# Patient Record
Sex: Female | Born: 1935 | Race: White | Hispanic: No | Marital: Married | Smoking: Never smoker
Health system: Southern US, Community
[De-identification: ages and names within clinical notes are randomized; demographics above are authoritative.]

## PROBLEM LIST (undated history)

## (undated) DIAGNOSIS — C4490 Unspecified malignant neoplasm of skin, unspecified: Secondary | ICD-10-CM

## (undated) DIAGNOSIS — I10 Essential (primary) hypertension: Secondary | ICD-10-CM

## (undated) DIAGNOSIS — Z8673 Personal history of transient ischemic attack (TIA), and cerebral infarction without residual deficits: Secondary | ICD-10-CM

## (undated) DIAGNOSIS — H9192 Unspecified hearing loss, left ear: Secondary | ICD-10-CM

## (undated) DIAGNOSIS — N3010 Interstitial cystitis (chronic) without hematuria: Secondary | ICD-10-CM

## (undated) DIAGNOSIS — G8929 Other chronic pain: Secondary | ICD-10-CM

## (undated) DIAGNOSIS — G629 Polyneuropathy, unspecified: Secondary | ICD-10-CM

## (undated) DIAGNOSIS — M797 Fibromyalgia: Secondary | ICD-10-CM

## (undated) DIAGNOSIS — F329 Major depressive disorder, single episode, unspecified: Secondary | ICD-10-CM

## (undated) DIAGNOSIS — R296 Repeated falls: Secondary | ICD-10-CM

## (undated) DIAGNOSIS — N390 Urinary tract infection, site not specified: Secondary | ICD-10-CM

## (undated) DIAGNOSIS — G35 Multiple sclerosis: Secondary | ICD-10-CM

## (undated) DIAGNOSIS — I471 Supraventricular tachycardia, unspecified: Secondary | ICD-10-CM

## (undated) DIAGNOSIS — M1990 Unspecified osteoarthritis, unspecified site: Secondary | ICD-10-CM

## (undated) DIAGNOSIS — R911 Solitary pulmonary nodule: Secondary | ICD-10-CM

## (undated) DIAGNOSIS — Z8781 Personal history of (healed) traumatic fracture: Secondary | ICD-10-CM

## (undated) DIAGNOSIS — C50919 Malignant neoplasm of unspecified site of unspecified female breast: Secondary | ICD-10-CM

## (undated) DIAGNOSIS — F419 Anxiety disorder, unspecified: Secondary | ICD-10-CM

## (undated) DIAGNOSIS — R42 Dizziness and giddiness: Secondary | ICD-10-CM

## (undated) DIAGNOSIS — E785 Hyperlipidemia, unspecified: Secondary | ICD-10-CM

## (undated) DIAGNOSIS — Z923 Personal history of irradiation: Secondary | ICD-10-CM

## (undated) DIAGNOSIS — I479 Paroxysmal tachycardia, unspecified: Secondary | ICD-10-CM

## (undated) DIAGNOSIS — F32A Depression, unspecified: Secondary | ICD-10-CM

## (undated) DIAGNOSIS — R079 Chest pain, unspecified: Secondary | ICD-10-CM

## (undated) DIAGNOSIS — K219 Gastro-esophageal reflux disease without esophagitis: Secondary | ICD-10-CM

## (undated) DIAGNOSIS — K228 Other specified diseases of esophagus: Secondary | ICD-10-CM

## (undated) DIAGNOSIS — E46 Unspecified protein-calorie malnutrition: Secondary | ICD-10-CM

## (undated) DIAGNOSIS — M47816 Spondylosis without myelopathy or radiculopathy, lumbar region: Secondary | ICD-10-CM

## (undated) DIAGNOSIS — K5790 Diverticulosis of intestine, part unspecified, without perforation or abscess without bleeding: Secondary | ICD-10-CM

## (undated) DIAGNOSIS — G5600 Carpal tunnel syndrome, unspecified upper limb: Secondary | ICD-10-CM

## (undated) DIAGNOSIS — E041 Nontoxic single thyroid nodule: Secondary | ICD-10-CM

## (undated) DIAGNOSIS — M25551 Pain in right hip: Secondary | ICD-10-CM

## (undated) DIAGNOSIS — K589 Irritable bowel syndrome without diarrhea: Secondary | ICD-10-CM

## (undated) DIAGNOSIS — R0602 Shortness of breath: Secondary | ICD-10-CM

## (undated) DIAGNOSIS — E7800 Pure hypercholesterolemia, unspecified: Secondary | ICD-10-CM

## (undated) DIAGNOSIS — E538 Deficiency of other specified B group vitamins: Secondary | ICD-10-CM

## (undated) HISTORY — DX: Unspecified osteoarthritis, unspecified site: M19.90

## (undated) HISTORY — DX: Other specified diseases of esophagus: K22.8

## (undated) HISTORY — PX: HEMORRHOID SURGERY: SHX153

## (undated) HISTORY — DX: Paroxysmal tachycardia, unspecified: I47.9

## (undated) HISTORY — DX: Personal history of (healed) traumatic fracture: Z87.81

## (undated) HISTORY — DX: Dizziness and giddiness: R42

## (undated) HISTORY — DX: Chest pain, unspecified: R07.9

## (undated) HISTORY — PX: CHOLECYSTECTOMY: SHX55

## (undated) HISTORY — DX: Unspecified hearing loss, left ear: H91.92

## (undated) HISTORY — DX: Deficiency of other specified B group vitamins: E53.8

## (undated) HISTORY — DX: Unspecified protein-calorie malnutrition: E46

## (undated) HISTORY — DX: Gastro-esophageal reflux disease without esophagitis: K21.9

## (undated) HISTORY — PX: TOTAL ABDOMINAL HYSTERECTOMY W/ BILATERAL SALPINGOOPHORECTOMY: SHX83

## (undated) HISTORY — DX: Hyperlipidemia, unspecified: E78.5

## (undated) HISTORY — DX: Polyneuropathy, unspecified: G62.9

## (undated) HISTORY — DX: Essential (primary) hypertension: I10

## (undated) HISTORY — DX: Nontoxic single thyroid nodule: E04.1

## (undated) HISTORY — DX: Supraventricular tachycardia: I47.1

## (undated) HISTORY — DX: Other chronic pain: G89.29

## (undated) HISTORY — DX: Spondylosis without myelopathy or radiculopathy, lumbar region: M47.816

## (undated) HISTORY — DX: Irritable bowel syndrome, unspecified: K58.9

## (undated) HISTORY — PX: OTHER SURGICAL HISTORY: SHX169

## (undated) HISTORY — DX: Urinary tract infection, site not specified: N39.0

## (undated) HISTORY — DX: Depression, unspecified: F32.A

## (undated) HISTORY — DX: Diverticulosis of intestine, part unspecified, without perforation or abscess without bleeding: K57.90

## (undated) HISTORY — DX: Pure hypercholesterolemia, unspecified: E78.00

## (undated) HISTORY — DX: Anxiety disorder, unspecified: F41.9

## (undated) HISTORY — PX: TONSILLECTOMY: SUR1361

## (undated) HISTORY — DX: Carpal tunnel syndrome, unspecified upper limb: G56.00

## (undated) HISTORY — DX: Solitary pulmonary nodule: R91.1

## (undated) HISTORY — DX: Repeated falls: R29.6

## (undated) HISTORY — PX: BREAST SURGERY: SHX581

## (undated) HISTORY — DX: Fibromyalgia: M79.7

## (undated) HISTORY — PX: TYMPANOPLASTY: SHX33

## (undated) HISTORY — DX: Supraventricular tachycardia, unspecified: I47.10

## (undated) HISTORY — DX: Shortness of breath: R06.02

## (undated) HISTORY — DX: Pain in right hip: M25.551

## (undated) HISTORY — DX: Multiple sclerosis: G35

## (undated) HISTORY — DX: Personal history of transient ischemic attack (TIA), and cerebral infarction without residual deficits: Z86.73

## (undated) HISTORY — PX: APPENDECTOMY: SHX54

## (undated) HISTORY — DX: Major depressive disorder, single episode, unspecified: F32.9

---

## 1968-08-06 HISTORY — PX: MASTECTOMY: SHX3

## 1973-08-06 HISTORY — PX: AUGMENTATION MAMMAPLASTY: SUR837

## 1998-06-07 ENCOUNTER — Other Ambulatory Visit: Admission: RE | Admit: 1998-06-07 | Discharge: 1998-06-07 | Payer: Self-pay | Admitting: Obstetrics and Gynecology

## 1998-07-22 ENCOUNTER — Ambulatory Visit (HOSPITAL_COMMUNITY): Admission: RE | Admit: 1998-07-22 | Discharge: 1998-07-22 | Payer: Self-pay | Admitting: Ophthalmology

## 1999-03-20 ENCOUNTER — Ambulatory Visit (HOSPITAL_COMMUNITY): Admission: RE | Admit: 1999-03-20 | Discharge: 1999-03-20 | Payer: Self-pay | Admitting: *Deleted

## 1999-03-20 ENCOUNTER — Encounter: Payer: Self-pay | Admitting: *Deleted

## 1999-06-26 ENCOUNTER — Other Ambulatory Visit: Admission: RE | Admit: 1999-06-26 | Discharge: 1999-06-26 | Payer: Self-pay | Admitting: Obstetrics and Gynecology

## 1999-10-02 ENCOUNTER — Encounter: Payer: Self-pay | Admitting: Family Medicine

## 1999-10-02 ENCOUNTER — Encounter: Admission: RE | Admit: 1999-10-02 | Discharge: 1999-10-02 | Payer: Self-pay | Admitting: Family Medicine

## 1999-10-17 ENCOUNTER — Ambulatory Visit (HOSPITAL_COMMUNITY): Admission: RE | Admit: 1999-10-17 | Discharge: 1999-10-17 | Payer: Self-pay | Admitting: Family Medicine

## 1999-10-17 ENCOUNTER — Encounter: Payer: Self-pay | Admitting: Family Medicine

## 1999-11-02 ENCOUNTER — Encounter: Payer: Self-pay | Admitting: General Surgery

## 1999-11-02 ENCOUNTER — Encounter: Admission: RE | Admit: 1999-11-02 | Discharge: 1999-11-02 | Payer: Self-pay | Admitting: General Surgery

## 1999-12-18 ENCOUNTER — Encounter: Payer: Self-pay | Admitting: General Surgery

## 1999-12-20 ENCOUNTER — Encounter (INDEPENDENT_AMBULATORY_CARE_PROVIDER_SITE_OTHER): Payer: Self-pay | Admitting: Specialist

## 1999-12-20 ENCOUNTER — Ambulatory Visit (HOSPITAL_COMMUNITY): Admission: RE | Admit: 1999-12-20 | Discharge: 1999-12-21 | Payer: Self-pay | Admitting: General Surgery

## 2000-02-20 ENCOUNTER — Encounter: Payer: Self-pay | Admitting: Obstetrics and Gynecology

## 2000-02-20 ENCOUNTER — Ambulatory Visit (HOSPITAL_COMMUNITY): Admission: RE | Admit: 2000-02-20 | Discharge: 2000-02-20 | Payer: Self-pay | Admitting: Rheumatology

## 2000-02-20 ENCOUNTER — Encounter: Payer: Self-pay | Admitting: Rheumatology

## 2000-02-29 ENCOUNTER — Encounter (INDEPENDENT_AMBULATORY_CARE_PROVIDER_SITE_OTHER): Payer: Self-pay | Admitting: Specialist

## 2000-02-29 ENCOUNTER — Inpatient Hospital Stay (HOSPITAL_COMMUNITY): Admission: RE | Admit: 2000-02-29 | Discharge: 2000-03-01 | Payer: Self-pay | Admitting: Obstetrics and Gynecology

## 2000-05-02 ENCOUNTER — Encounter: Admission: RE | Admit: 2000-05-02 | Discharge: 2000-05-24 | Payer: Self-pay | Admitting: Neurosurgery

## 2000-07-04 ENCOUNTER — Ambulatory Visit (HOSPITAL_COMMUNITY): Admission: RE | Admit: 2000-07-04 | Discharge: 2000-07-05 | Payer: Self-pay | Admitting: Neurosurgery

## 2000-08-19 ENCOUNTER — Ambulatory Visit (HOSPITAL_COMMUNITY): Admission: RE | Admit: 2000-08-19 | Discharge: 2000-08-19 | Payer: Self-pay | Admitting: Neurosurgery

## 2000-09-05 ENCOUNTER — Ambulatory Visit (HOSPITAL_COMMUNITY): Admission: RE | Admit: 2000-09-05 | Discharge: 2000-09-05 | Payer: Self-pay | Admitting: Neurosurgery

## 2000-09-26 ENCOUNTER — Ambulatory Visit (HOSPITAL_COMMUNITY): Admission: RE | Admit: 2000-09-26 | Discharge: 2000-09-26 | Payer: Self-pay | Admitting: Neurosurgery

## 2000-11-07 ENCOUNTER — Ambulatory Visit (HOSPITAL_COMMUNITY): Admission: RE | Admit: 2000-11-07 | Discharge: 2000-11-07 | Payer: Self-pay | Admitting: Obstetrics and Gynecology

## 2000-11-07 ENCOUNTER — Encounter: Payer: Self-pay | Admitting: Obstetrics and Gynecology

## 2001-01-16 ENCOUNTER — Inpatient Hospital Stay (HOSPITAL_COMMUNITY): Admission: RE | Admit: 2001-01-16 | Discharge: 2001-01-21 | Payer: Self-pay | Admitting: Neurosurgery

## 2001-02-18 ENCOUNTER — Encounter: Admission: RE | Admit: 2001-02-18 | Discharge: 2001-02-18 | Payer: Self-pay | Admitting: Neurosurgery

## 2001-03-20 ENCOUNTER — Ambulatory Visit (HOSPITAL_BASED_OUTPATIENT_CLINIC_OR_DEPARTMENT_OTHER): Admission: RE | Admit: 2001-03-20 | Discharge: 2001-03-20 | Payer: Self-pay | Admitting: *Deleted

## 2001-05-30 ENCOUNTER — Encounter: Admission: RE | Admit: 2001-05-30 | Discharge: 2001-05-30 | Payer: Self-pay | Admitting: Neurosurgery

## 2001-10-01 ENCOUNTER — Encounter: Payer: Self-pay | Admitting: *Deleted

## 2001-10-01 ENCOUNTER — Encounter: Admission: RE | Admit: 2001-10-01 | Discharge: 2001-10-01 | Payer: Self-pay | Admitting: *Deleted

## 2001-10-04 HISTORY — PX: CARDIAC CATHETERIZATION: SHX172

## 2001-10-23 ENCOUNTER — Ambulatory Visit (HOSPITAL_COMMUNITY): Admission: RE | Admit: 2001-10-23 | Discharge: 2001-10-23 | Payer: Self-pay | Admitting: Cardiology

## 2001-11-13 ENCOUNTER — Encounter: Admission: RE | Admit: 2001-11-13 | Discharge: 2001-11-13 | Payer: Self-pay | Admitting: *Deleted

## 2001-11-13 ENCOUNTER — Encounter: Payer: Self-pay | Admitting: *Deleted

## 2001-11-28 ENCOUNTER — Ambulatory Visit (HOSPITAL_COMMUNITY): Admission: RE | Admit: 2001-11-28 | Discharge: 2001-11-28 | Payer: Self-pay | Admitting: Neurology

## 2001-11-28 ENCOUNTER — Encounter: Payer: Self-pay | Admitting: Neurology

## 2001-12-15 ENCOUNTER — Ambulatory Visit (HOSPITAL_BASED_OUTPATIENT_CLINIC_OR_DEPARTMENT_OTHER): Admission: RE | Admit: 2001-12-15 | Discharge: 2001-12-15 | Payer: Self-pay | Admitting: Orthopedic Surgery

## 2001-12-22 ENCOUNTER — Encounter: Admission: RE | Admit: 2001-12-22 | Discharge: 2002-03-22 | Payer: Self-pay | Admitting: Neurology

## 2002-04-18 ENCOUNTER — Ambulatory Visit (HOSPITAL_COMMUNITY): Admission: RE | Admit: 2002-04-18 | Discharge: 2002-04-18 | Payer: Self-pay | Admitting: Neurosurgery

## 2002-10-29 ENCOUNTER — Ambulatory Visit (HOSPITAL_COMMUNITY): Admission: RE | Admit: 2002-10-29 | Discharge: 2002-10-29 | Payer: Self-pay | Admitting: *Deleted

## 2002-10-29 ENCOUNTER — Encounter: Payer: Self-pay | Admitting: Obstetrics and Gynecology

## 2002-10-29 ENCOUNTER — Encounter: Admission: RE | Admit: 2002-10-29 | Discharge: 2002-10-29 | Payer: Self-pay | Admitting: Obstetrics and Gynecology

## 2002-10-29 ENCOUNTER — Encounter: Payer: Self-pay | Admitting: *Deleted

## 2002-11-19 ENCOUNTER — Encounter: Admission: RE | Admit: 2002-11-19 | Discharge: 2002-11-19 | Payer: Self-pay | Admitting: *Deleted

## 2002-11-19 ENCOUNTER — Encounter: Payer: Self-pay | Admitting: *Deleted

## 2003-10-04 ENCOUNTER — Ambulatory Visit (HOSPITAL_COMMUNITY): Admission: RE | Admit: 2003-10-04 | Discharge: 2003-10-04 | Payer: Self-pay | Admitting: Family Medicine

## 2003-11-11 ENCOUNTER — Encounter: Payer: Self-pay | Admitting: Internal Medicine

## 2003-11-11 ENCOUNTER — Ambulatory Visit (HOSPITAL_COMMUNITY): Admission: RE | Admit: 2003-11-11 | Discharge: 2003-11-11 | Payer: Self-pay | Admitting: Internal Medicine

## 2003-11-25 ENCOUNTER — Encounter: Admission: RE | Admit: 2003-11-25 | Discharge: 2003-11-25 | Payer: Self-pay | Admitting: Family Medicine

## 2003-12-29 ENCOUNTER — Encounter: Admission: RE | Admit: 2003-12-29 | Discharge: 2003-12-29 | Payer: Self-pay | Admitting: Orthopedic Surgery

## 2003-12-29 ENCOUNTER — Ambulatory Visit (HOSPITAL_BASED_OUTPATIENT_CLINIC_OR_DEPARTMENT_OTHER): Admission: RE | Admit: 2003-12-29 | Discharge: 2003-12-29 | Payer: Self-pay | Admitting: Orthopedic Surgery

## 2003-12-29 ENCOUNTER — Ambulatory Visit (HOSPITAL_COMMUNITY): Admission: RE | Admit: 2003-12-29 | Discharge: 2003-12-29 | Payer: Self-pay | Admitting: Orthopedic Surgery

## 2004-01-20 ENCOUNTER — Encounter: Payer: Self-pay | Admitting: Internal Medicine

## 2004-05-04 ENCOUNTER — Encounter: Admission: RE | Admit: 2004-05-04 | Discharge: 2004-06-01 | Payer: Self-pay | Admitting: Neurology

## 2004-05-17 ENCOUNTER — Ambulatory Visit (HOSPITAL_COMMUNITY): Admission: RE | Admit: 2004-05-17 | Discharge: 2004-05-17 | Payer: Self-pay | Admitting: Family Medicine

## 2004-10-02 ENCOUNTER — Encounter: Admission: RE | Admit: 2004-10-02 | Discharge: 2004-10-02 | Payer: Self-pay | Admitting: Orthopedic Surgery

## 2004-10-02 ENCOUNTER — Ambulatory Visit (HOSPITAL_BASED_OUTPATIENT_CLINIC_OR_DEPARTMENT_OTHER): Admission: RE | Admit: 2004-10-02 | Discharge: 2004-10-02 | Payer: Self-pay | Admitting: Orthopedic Surgery

## 2004-10-02 ENCOUNTER — Ambulatory Visit (HOSPITAL_COMMUNITY): Admission: RE | Admit: 2004-10-02 | Discharge: 2004-10-02 | Payer: Self-pay | Admitting: Orthopedic Surgery

## 2004-10-26 ENCOUNTER — Ambulatory Visit (HOSPITAL_COMMUNITY): Admission: RE | Admit: 2004-10-26 | Discharge: 2004-10-26 | Payer: Self-pay | Admitting: Family Medicine

## 2004-11-16 ENCOUNTER — Encounter: Admission: RE | Admit: 2004-11-16 | Discharge: 2004-11-16 | Payer: Self-pay | Admitting: Neurology

## 2004-11-26 ENCOUNTER — Emergency Department (HOSPITAL_COMMUNITY): Admission: EM | Admit: 2004-11-26 | Discharge: 2004-11-26 | Payer: Self-pay | Admitting: Emergency Medicine

## 2004-12-07 ENCOUNTER — Encounter: Admission: RE | Admit: 2004-12-07 | Discharge: 2004-12-07 | Payer: Self-pay | Admitting: Obstetrics and Gynecology

## 2004-12-13 ENCOUNTER — Ambulatory Visit: Payer: Self-pay | Admitting: Internal Medicine

## 2005-03-15 ENCOUNTER — Ambulatory Visit: Payer: Self-pay | Admitting: Internal Medicine

## 2005-03-20 ENCOUNTER — Ambulatory Visit (HOSPITAL_COMMUNITY): Admission: RE | Admit: 2005-03-20 | Discharge: 2005-03-20 | Payer: Self-pay | Admitting: Internal Medicine

## 2005-03-29 ENCOUNTER — Ambulatory Visit: Payer: Self-pay | Admitting: Internal Medicine

## 2005-04-12 ENCOUNTER — Ambulatory Visit: Payer: Self-pay | Admitting: Internal Medicine

## 2005-04-12 ENCOUNTER — Ambulatory Visit (HOSPITAL_COMMUNITY): Admission: RE | Admit: 2005-04-12 | Discharge: 2005-04-12 | Payer: Self-pay | Admitting: Internal Medicine

## 2005-05-10 ENCOUNTER — Ambulatory Visit: Payer: Self-pay | Admitting: Internal Medicine

## 2005-06-18 ENCOUNTER — Ambulatory Visit: Payer: Self-pay | Admitting: Internal Medicine

## 2005-10-13 ENCOUNTER — Emergency Department (HOSPITAL_COMMUNITY): Admission: EM | Admit: 2005-10-13 | Discharge: 2005-10-13 | Payer: Self-pay | Admitting: Emergency Medicine

## 2005-12-19 ENCOUNTER — Encounter: Admission: RE | Admit: 2005-12-19 | Discharge: 2005-12-19 | Payer: Self-pay | Admitting: Obstetrics and Gynecology

## 2006-11-13 ENCOUNTER — Other Ambulatory Visit: Admission: RE | Admit: 2006-11-13 | Discharge: 2006-11-13 | Payer: Self-pay | Admitting: Unknown Physician Specialty

## 2006-11-29 ENCOUNTER — Emergency Department (HOSPITAL_COMMUNITY): Admission: EM | Admit: 2006-11-29 | Discharge: 2006-11-29 | Payer: Self-pay | Admitting: Emergency Medicine

## 2006-12-25 ENCOUNTER — Encounter: Admission: RE | Admit: 2006-12-25 | Discharge: 2006-12-25 | Payer: Self-pay | Admitting: Obstetrics and Gynecology

## 2007-01-01 ENCOUNTER — Encounter: Admission: RE | Admit: 2007-01-01 | Discharge: 2007-01-01 | Payer: Self-pay | Admitting: Obstetrics and Gynecology

## 2007-06-09 ENCOUNTER — Encounter: Admission: RE | Admit: 2007-06-09 | Discharge: 2007-06-09 | Payer: Self-pay | Admitting: Surgery

## 2007-11-06 ENCOUNTER — Ambulatory Visit: Payer: Self-pay | Admitting: Internal Medicine

## 2007-12-10 ENCOUNTER — Ambulatory Visit: Payer: Self-pay | Admitting: Internal Medicine

## 2007-12-31 ENCOUNTER — Encounter: Admission: RE | Admit: 2007-12-31 | Discharge: 2007-12-31 | Payer: Self-pay | Admitting: Obstetrics and Gynecology

## 2008-05-05 ENCOUNTER — Ambulatory Visit: Payer: Self-pay | Admitting: Internal Medicine

## 2008-05-21 ENCOUNTER — Encounter (INDEPENDENT_AMBULATORY_CARE_PROVIDER_SITE_OTHER): Payer: Self-pay | Admitting: *Deleted

## 2008-05-21 ENCOUNTER — Inpatient Hospital Stay (HOSPITAL_COMMUNITY): Admission: EM | Admit: 2008-05-21 | Discharge: 2008-05-25 | Payer: Self-pay | Admitting: Emergency Medicine

## 2008-05-23 ENCOUNTER — Encounter (INDEPENDENT_AMBULATORY_CARE_PROVIDER_SITE_OTHER): Payer: Self-pay | Admitting: *Deleted

## 2008-05-25 ENCOUNTER — Encounter (INDEPENDENT_AMBULATORY_CARE_PROVIDER_SITE_OTHER): Payer: Self-pay | Admitting: *Deleted

## 2008-05-25 ENCOUNTER — Encounter: Payer: Self-pay | Admitting: Internal Medicine

## 2008-06-16 ENCOUNTER — Ambulatory Visit: Payer: Self-pay | Admitting: Internal Medicine

## 2008-06-16 DIAGNOSIS — R1032 Left lower quadrant pain: Secondary | ICD-10-CM | POA: Insufficient documentation

## 2008-06-16 DIAGNOSIS — K591 Functional diarrhea: Secondary | ICD-10-CM

## 2008-06-16 DIAGNOSIS — R197 Diarrhea, unspecified: Secondary | ICD-10-CM | POA: Insufficient documentation

## 2008-06-16 DIAGNOSIS — K582 Mixed irritable bowel syndrome: Secondary | ICD-10-CM

## 2008-06-19 ENCOUNTER — Encounter: Payer: Self-pay | Admitting: Internal Medicine

## 2008-07-07 ENCOUNTER — Encounter: Admission: RE | Admit: 2008-07-07 | Discharge: 2008-07-07 | Payer: Self-pay | Admitting: Orthopedic Surgery

## 2008-07-21 ENCOUNTER — Ambulatory Visit: Payer: Self-pay | Admitting: Internal Medicine

## 2008-08-09 ENCOUNTER — Telehealth: Payer: Self-pay | Admitting: Internal Medicine

## 2008-08-22 ENCOUNTER — Inpatient Hospital Stay (HOSPITAL_COMMUNITY): Admission: EM | Admit: 2008-08-22 | Discharge: 2008-08-26 | Payer: Self-pay | Admitting: Emergency Medicine

## 2008-08-23 ENCOUNTER — Ambulatory Visit: Payer: Self-pay | Admitting: Internal Medicine

## 2008-10-06 ENCOUNTER — Ambulatory Visit: Payer: Self-pay | Admitting: Internal Medicine

## 2008-10-15 ENCOUNTER — Ambulatory Visit: Payer: Self-pay | Admitting: Internal Medicine

## 2008-11-24 ENCOUNTER — Ambulatory Visit: Payer: Self-pay | Admitting: Internal Medicine

## 2009-01-12 ENCOUNTER — Encounter: Admission: RE | Admit: 2009-01-12 | Discharge: 2009-01-12 | Payer: Self-pay | Admitting: Family Medicine

## 2009-01-14 ENCOUNTER — Encounter: Admission: RE | Admit: 2009-01-14 | Discharge: 2009-01-14 | Payer: Self-pay | Admitting: Family Medicine

## 2009-04-12 ENCOUNTER — Inpatient Hospital Stay (HOSPITAL_COMMUNITY): Admission: EM | Admit: 2009-04-12 | Discharge: 2009-04-19 | Payer: Self-pay | Admitting: Emergency Medicine

## 2009-04-13 ENCOUNTER — Telehealth (INDEPENDENT_AMBULATORY_CARE_PROVIDER_SITE_OTHER): Payer: Self-pay | Admitting: *Deleted

## 2009-04-25 ENCOUNTER — Encounter: Payer: Self-pay | Admitting: Internal Medicine

## 2009-05-25 ENCOUNTER — Encounter: Payer: Self-pay | Admitting: Internal Medicine

## 2009-06-01 ENCOUNTER — Ambulatory Visit: Payer: Self-pay | Admitting: Internal Medicine

## 2009-06-01 DIAGNOSIS — I10 Essential (primary) hypertension: Secondary | ICD-10-CM

## 2009-06-01 DIAGNOSIS — Z8679 Personal history of other diseases of the circulatory system: Secondary | ICD-10-CM

## 2009-06-09 ENCOUNTER — Ambulatory Visit: Payer: Self-pay | Admitting: Internal Medicine

## 2009-06-09 ENCOUNTER — Encounter: Payer: Self-pay | Admitting: Internal Medicine

## 2009-06-12 ENCOUNTER — Encounter: Payer: Self-pay | Admitting: Internal Medicine

## 2009-08-11 ENCOUNTER — Telehealth: Payer: Self-pay | Admitting: Internal Medicine

## 2009-08-30 ENCOUNTER — Telehealth (INDEPENDENT_AMBULATORY_CARE_PROVIDER_SITE_OTHER): Payer: Self-pay | Admitting: *Deleted

## 2009-10-10 ENCOUNTER — Telehealth: Payer: Self-pay | Admitting: Internal Medicine

## 2009-10-12 ENCOUNTER — Ambulatory Visit: Payer: Self-pay | Admitting: Internal Medicine

## 2009-10-15 ENCOUNTER — Encounter: Payer: Self-pay | Admitting: Internal Medicine

## 2009-10-18 ENCOUNTER — Telehealth: Payer: Self-pay | Admitting: Internal Medicine

## 2009-12-07 ENCOUNTER — Encounter: Admission: RE | Admit: 2009-12-07 | Discharge: 2009-12-07 | Payer: Self-pay | Admitting: Otolaryngology

## 2009-12-23 ENCOUNTER — Encounter: Admission: RE | Admit: 2009-12-23 | Discharge: 2009-12-23 | Payer: Self-pay | Admitting: Otolaryngology

## 2009-12-27 ENCOUNTER — Ambulatory Visit (HOSPITAL_BASED_OUTPATIENT_CLINIC_OR_DEPARTMENT_OTHER): Admission: RE | Admit: 2009-12-27 | Discharge: 2009-12-28 | Payer: Self-pay | Admitting: Otolaryngology

## 2010-01-18 ENCOUNTER — Encounter: Admission: RE | Admit: 2010-01-18 | Discharge: 2010-01-18 | Payer: Self-pay | Admitting: Family Medicine

## 2010-04-11 ENCOUNTER — Telehealth: Payer: Self-pay | Admitting: Internal Medicine

## 2010-05-17 ENCOUNTER — Encounter: Admission: RE | Admit: 2010-05-17 | Discharge: 2010-05-17 | Payer: Self-pay | Admitting: Family Medicine

## 2010-06-26 ENCOUNTER — Telehealth: Payer: Self-pay | Admitting: Internal Medicine

## 2010-06-26 ENCOUNTER — Ambulatory Visit: Payer: Self-pay | Admitting: Internal Medicine

## 2010-06-26 DIAGNOSIS — N3010 Interstitial cystitis (chronic) without hematuria: Secondary | ICD-10-CM

## 2010-06-26 DIAGNOSIS — E1149 Type 2 diabetes mellitus with other diabetic neurological complication: Secondary | ICD-10-CM

## 2010-06-26 DIAGNOSIS — IMO0001 Reserved for inherently not codable concepts without codable children: Secondary | ICD-10-CM

## 2010-06-26 DIAGNOSIS — E119 Type 2 diabetes mellitus without complications: Secondary | ICD-10-CM | POA: Insufficient documentation

## 2010-06-26 DIAGNOSIS — F3341 Major depressive disorder, recurrent, in partial remission: Secondary | ICD-10-CM

## 2010-06-26 DIAGNOSIS — M797 Fibromyalgia: Secondary | ICD-10-CM

## 2010-06-26 DIAGNOSIS — R296 Repeated falls: Secondary | ICD-10-CM | POA: Insufficient documentation

## 2010-06-26 DIAGNOSIS — K219 Gastro-esophageal reflux disease without esophagitis: Secondary | ICD-10-CM

## 2010-06-26 DIAGNOSIS — K222 Esophageal obstruction: Secondary | ICD-10-CM | POA: Insufficient documentation

## 2010-06-26 LAB — CONVERTED CEMR LAB
Basophils Absolute: 0 10*3/uL (ref 0.0–0.1)
Basophils Relative: 0.3 % (ref 0.0–3.0)
CRP, High Sensitivity: 1.83 (ref 0.00–5.00)
Eosinophils Absolute: 0.1 10*3/uL (ref 0.0–0.7)
Glucose, Bld: 122 mg/dL — ABNORMAL HIGH (ref 70–99)
HCT: 36.2 % (ref 36.0–46.0)
Lymphs Abs: 1.4 10*3/uL (ref 0.7–4.0)
Monocytes Relative: 6.9 % (ref 3.0–12.0)
Neutro Abs: 5.5 10*3/uL (ref 1.4–7.7)
Neutrophils Relative %: 73.1 % (ref 43.0–77.0)
Nitrite: NEGATIVE
RDW: 14.6 % (ref 11.5–14.6)
Sodium: 142 meq/L (ref 135–145)
Specific Gravity, Urine: >=1.03 (ref 1.000–1.030)
Urine Glucose: NEGATIVE mg/dL
Urobilinogen, UA: 0.2 (ref 0.0–1.0)
pH: 6.5 (ref 5.0–8.0)

## 2010-06-27 ENCOUNTER — Ambulatory Visit: Payer: Self-pay | Admitting: Cardiology

## 2010-06-28 ENCOUNTER — Telehealth: Payer: Self-pay | Admitting: Physician Assistant

## 2010-07-18 ENCOUNTER — Ambulatory Visit: Payer: Self-pay | Admitting: Internal Medicine

## 2010-07-18 ENCOUNTER — Telehealth: Payer: Self-pay | Admitting: Internal Medicine

## 2010-07-20 ENCOUNTER — Telehealth: Payer: Self-pay | Admitting: Internal Medicine

## 2010-08-02 ENCOUNTER — Telehealth: Payer: Self-pay | Admitting: Internal Medicine

## 2010-08-09 ENCOUNTER — Ambulatory Visit: Payer: Self-pay | Admitting: Cardiology

## 2010-08-24 ENCOUNTER — Ambulatory Visit
Admission: RE | Admit: 2010-08-24 | Discharge: 2010-08-24 | Payer: Self-pay | Source: Home / Self Care | Attending: Internal Medicine | Admitting: Internal Medicine

## 2010-08-24 ENCOUNTER — Encounter: Payer: Self-pay | Admitting: Internal Medicine

## 2010-08-26 ENCOUNTER — Encounter: Payer: Self-pay | Admitting: Family Medicine

## 2010-09-05 NOTE — Progress Notes (Signed)
Summary: stool study results and follow-up   Phone Note Outgoing Call   Summary of Call: let her know no infection how is she? I think its IBS Iva Boop MD, [facility]  October 18, 2009 9:07 PM   Follow-up for Phone Call        Patient aware, she hasn't had diarhea in the last 3 days.  No pain at this time.  "I'm doing pretty good".  I have asked her to follow up with Dr Leone Payor as needed.  She is aware we will call back if Dr Leone Payor has any other recommendations. Follow-up by: Darcey Nora RN, CGRN,  October 19, 2009 2:27 PM

## 2010-09-05 NOTE — Progress Notes (Signed)
Summary: triage   Phone Note Call from Patient Call back at [phone]   Caller: Patient Call For: Dr. Leone Payor Reason for Call: Talk to Nurse Details for Reason: triage Summary of Call: Pt. calls complaining of diarrhea for the last two weeks but today especially experiencing tremendous pain on her left side under her ribs associated with it.  Requests to be seen today.  Please call and advise. Initial call taken by: Schuyler Amor,  June 26, 2010 9:49 AM  Follow-up for Phone Call        Several day hx of diarrhea and LLQ pain.  Patient  states severe LLQ tenderness and difficulty walking due to the pain.  Denies nausea or vomiting, fever.  She does c/o waking up in a cold sweat.  Patient  is advised to stay on a clear liquid diet and come in and see Amy Esterwood PA toay at 2:00 Follow-up by: Darcey Nora RN, CGRN,  June 26, 2010 11:06 AM

## 2010-09-05 NOTE — Assessment & Plan Note (Signed)
Summary: LLQ abdominal pain, diarrhea/sheri    History of Present Illness Primary GI MD: Stan Head MD [facility] Primary Provider: Sonny Masters Smith,MD Requesting Provider: n/a Chief Complaint: LLQ abd tenderness and intermittant sharp pains that are worse after meals. Also intermittant watery loose diarrhea x 2 weeks. Pain and diarrhea has gotten worse in the last week.  History of Present Illness:   PLEASANT 77 YO FEMALE KNOWN TO DR. Leone Payor. SHE HAS HX OF DIVERTICULITIS AND IBS. SHE COMES IN TODAY WITH C/O ONE WEEK OF LOWER ABDOMINAL PAIN WHICH HAS BEEN CONSTANT .PAIN IS LLQ AND RADIATES ACROSS HER LOWER ABDOMEN. SHE HAS HAD NAUSEA, NO VOMITING, DECREASED APPETITE. SHE DENIES FEVRE BUT HAS HAD SWEATS THE LAST FEW NIGHTS. STOOLS ARE LOOSE, USUALLY WITH DIARRHEA AT NIGHT, NO BLEEDING. . SHE HAS NOT FELT WELL FOR 2 WEEKS WITH GAS, INCREASED STOOLING, THEN PAIN STARTED.  SHE SAYS SHE HURTS WITH WALKING, HAS BEEN TAKING HYDROCODONE  WHICH HELPS SOME. LAST COLONOSCOPY3/10 WITH SEVERE DIVERTICULOSIS ASCENDING TO SIGMOID COLON. SHE WAS HOSPITALIZED WITH DIVERTICULITIS ONE YEAR AGO.   GI Review of Systems    Reports abdominal pain, loss of appetite, and  nausea.     Location of  Abdominal pain: LLQ.    Denies acid reflux, belching, bloating, chest pain, dysphagia with liquids, dysphagia with solids, heartburn, vomiting, vomiting blood, weight loss, and  weight gain.      Reports diarrhea, hemorrhoids, and  rectal pain.     Denies anal fissure, black tarry stools, change in bowel habit, constipation, diverticulosis, fecal incontinence, heme positive stool, irritable bowel syndrome, jaundice, light color stool, liver problems, and  rectal bleeding.    Current Medications (verified): 1)  Metformin Hcl 500 Mg Tabs (Metformin Hcl) .Marland Kitchen.. 1 By Mouth Two Times A Day 2)  Simvastatin 40 Mg Tabs (Simvastatin) .Marland Kitchen.. 1 By Mouth Once Daily 3)  Sertraline Hcl 50 Mg Tabs (Sertraline Hcl) .Marland Kitchen.. 1 By Mouth Once Daily 4)   Lisinopril 20 Mg Tabs (Lisinopril) .Marland Kitchen.. 1 By Mouth Once Daily 5)  Sular 34 Mg Xr24h-Tab (Nisoldipine) .... Take 1 Tablet By Mouth Once A Day 6)  Zetia 10 Mg Tabs (Ezetimibe) .Marland Kitchen.. 1 By Mouth Once Daily 7)  Tambocor 50 Mg Tabs (Flecainide Acetate) .Marland Kitchen.. 1 By Mouth Two Times A Day/ Pt Due For Appt. 8)  Diazepam 2 Mg Tabs (Diazepam) .Marland Kitchen.. 1 By Mouth As Needed 9)  Meclizine Hcl 25 Mg Tabs (Meclizine Hcl) .Marland Kitchen.. 1 Tablet By Mouth As Needed For Dizziness 10)  Prevacid 15 Mg Cpdr (Lansoprazole) .... One Tablet By Mouth Once Daily 11)  Elmiron 100 Mg Caps (Pentosan Polysulfate Sodium) .... Take 1 Tablet By Mouth Once A Day 12)  Calcium .... Take 1 Tablet By Mouth As Needed 13)  Glycopyrrolate 2 Mg  Tabs (Glycopyrrolate) .... Take 1 Tablet By Mouth Two Times A Day 14)  Metamucil 48.57 % Powd (Psyllium) .... Titrate Up To 1 Tablespoon Qd 15)  Lyrica 75 Mg Caps (Pregabalin) .... One Tablet By Mouth Once Daily 16)  Hydrocodone-Acetaminophen 5-500 Mg Tabs (Hydrocodone-Acetaminophen) .... As Needed 17)  Flexeril 10 Mg Tabs (Cyclobenzaprine Hcl) .... Take One By Mouth Two Times A Day 18)  Pepto-Bismol 524 Mg/64ml Susp (Bismuth Subsalicylate) .... Take As Needed 19)  Promethazine Hcl 25 Mg Tabs (Promethazine Hcl) .... Take 1/2-1 Tablet Every 6-8 Hours As Needed For Nausea 20)  Imodium A-D 2 Mg Tabs (Loperamide Hcl) .... Take 1 Tablet As Needed Up To 4 Tablets Per Day 21)  Flecainide Acetate  50 Mg Tabs (Flecainide Acetate) .... Take One Tablet By Mouth Every 12 Hours  Allergies (verified): 1)  ! Sulfa 2)  ! Biaxin 3)  ! Neurontin 4)  ! * Avonex 5)  ! * Decadron 6)  ! * Ivp Dye 7)  ! * Synthroid 8)  ! * Methylpred 9)  ! * Meto 10)  ! Cardizem  Past History:  Past Medical History: SEVERE DIVERTICULOSIS HX BREAST CANCER Hemorrhoids Esophageal Stricture/S/P DILATION AODM Hypertension Irritable Bowel Syndrome Arthritis Chronic Headaches Depression Fibromyalgia Multiple Sclerosis Spinal  Stenosis Interstitial Cystitis  TIA right eye  Past Surgical History: Breast-Mastectomy-right Tubal Ligation Appendectomy Cholecystectomy Hemorrhoidectomy Hysterectomy Deviated Septum Surgery CTS surgery Cataracts both eye 3 retina surgeries back surgery breast implant Rectocele Cystocele exploratory surgery x 3 surgery on 5th digit left hand x 2 left shoulder surgery Feb 2011 [facility]  Family History: Reviewed history from 10/06/2008 and no changes required. No FH of Colon Cancer: father died from lung cancer Family History of Clotting disorder: Brother  Social History: Reviewed history from 10/06/2008 and no changes required. Patient has never smoked.  Alcohol Use - no Daily Caffeine Use-1 cup daily Illicit Drug Use - no Patient does not get regular exercise.   Review of Systems  The patient denies allergy/sinus, anemia, anxiety-new, arthritis/joint pain, back pain, blood in urine, breast changes/lumps, change in vision, confusion, cough, coughing up blood, depression-new, fainting, fatigue, fever, headaches-new, hearing problems, heart murmur, heart rhythm changes, itching, menstrual pain, muscle pains/cramps, night sweats, nosebleeds, pregnancy symptoms, shortness of breath, skin rash, sleeping problems, sore throat, swelling of feet/legs, swollen lymph glands, thirst - excessive , urination - excessive , urination changes/pain, urine leakage, vision changes, and voice change.         SEE HPI  Vital Signs:  Patient profile:   77 year old female Height:      62 inches Weight:      154.25 pounds BMI:     28.31 Pulse rate:   70 / minute Pulse rhythm:   regular BP sitting:   122 / 62  (right arm) Cuff size:   regular  Vitals Entered By: Christie Nottingham CMA Duncan Dull) (June 26, 2010 2:00 PM)  Physical Exam  General:  Well developed, well nourished, no acute distress.,PALE Head:  Normocephalic and atraumatic. Eyes:  PERRLA, no icterus. Neck:   Supple; no masses or thyromegaly. Lungs:  Clear throughout to auscultation. Heart:  Regular rate and rhythm; no murmurs, rubs,  or bruits. Abdomen:  SOFT, QUITE TENDER LLQ,EARLY REBOUND, BS+, MILD DIFFUSE TENDERNESS, NO MASS OR HSM Rectal:  NOT DONE Extremities:  No clubbing, cyanosis, edema or deformities noted. Neurologic:  Alert and  oriented x4;  grossly normal neurologically. Psych:  Alert and cooperative. Normal mood and affect.   Impression & Recommendations:  Problem # 1:  RECURRENT DIVERTICULITIS-COLON (ICD-562.11) Assessment Deteriorated 77 YO FEMALE WITH RECURRENT DIVERTICULITIS, WITH LLQ PAIN X ONE WEEK.  LABS AS BELOW CT ABDOMEN PELVIS-NO IV CONTRAST, R/O COMPLICATED DIVERTICULITIS START CIPRO 500 MG TWICE DAILY X 2 WEEKS START FLAGYL 500 MG TWICE DAILY X 2 WEEKS CONTINUE HYDROCODONE AS NEEDED FOR PAIN FULL LIQUID DIET X 24-48 HOURS, THEN GRADUALLY ADVANCE. WILL ARRANGE FOLLOW UP BASED ON CT . Orders: TLB-BMP (Basic Metabolic Panel-BMET) ([id number]) TLB-CRP-High Sensitivity (C-Reactive Protein) (86140-FCRP) TLB-CBC Platelet - w/Differential ([id number]) TLB-Udip w/ Micro (81001-URINE) CT Abdomen/Pelvis w/o Contrast (CT ABD/Pel w/o con)  Problem # 2:  IRRITABLE BOWEL SYNDROME (ICD-564.1) Assessment: Comment Only  Problem # 3:  HYPERTENSION (ICD-401.9) Assessment: Comment Only  Problem # 4:  GERD (ICD-530.81) Assessment: Comment Only STABLE  Problem # 5:  INTERSTITIAL CYSTITIS (ICD-595.1) Assessment: Comment Only  Patient Instructions: 1)  Please go to lab, basement level. 2)  We scheduled the CT Scan at [facility], [address] [facility] for tomorrow 06-27-10. 3)  Directions and contrast  provided. 4)  We will call you with the lab and CT Scan results. 5)  Stay on a full liquid diet for 24 to 28 hours.  Brochure provided. 6)  Copy sent to : Merri Brunette, MD 7)  The medication list was reviewed and reconciled.  All changed / newly prescribed medications  were explained.  A complete medication list was provided to the patient / caregiver. Prescriptions: FLAGYL 500 MG TABS (METRONIDAZOLE) Take 1 tab twice daily x 14 days  #28 x 0   Entered by:   Lowry Ram NCMA   Authorized by:   Sammuel Cooper PA-c   Signed by:   Lowry Ram NCMA on 06/26/2010   Method used:   Electronically to        [facility] [phone]* (retail)       [address] [place], [place]  [address]       Ph: [phone] or [phone]       Fax: [phone]   RxID:   [id number] CIPRO 500 MG TABS (CIPROFLOXACIN HCL) Take 1 tab twice daily x 14 days  #28 x 0   Entered by:   Lowry Ram NCMA   Authorized by:   Sammuel Cooper PA-c   Signed by:   Lowry Ram NCMA on 06/26/2010   Method used:   Electronically to        [facility] [phone]* (retail)       [address] [place], [place]  [address]       Ph: [phone] or [phone]       Fax: [phone]   RxID:   [id number]

## 2010-09-05 NOTE — Progress Notes (Signed)
Summary: surgical clearance   Phone Note Call from Patient Call back at Home Phone [phone]   Caller: Patient Reason for Call: Talk to Nurse Summary of Call: needs surgical clearance for left shoulder surgery, please fax ok to Dr Turner Blakely [phone] Initial call taken by: Migdalia Dk,  August 11, 2009 9:33 AM  Follow-up for Phone Call        ok for surgery per Dr Ladona Ridgel. Low risk for cardiovascular complications Dennis Bast, RN, BSN  August 11, 2009 6:30 PM

## 2010-09-05 NOTE — Progress Notes (Signed)
   Phone Note From Other Clinic   Caller: Huntley Dec Details for Reason: Pt.Information Initial call taken by: Denny Peon    Faxed all Cardiac over to [facility] to fax [phone] [facility]  August 30, 2009 10:44 AM

## 2010-09-05 NOTE — Assessment & Plan Note (Signed)
Summary: right lower quad pain/follow up from triage/sheri    History of Present Illness Visit Type: Follow-up Visit Primary GI MD: Stan Head MD [facility] Primary Provider: Sonny Masters Smith,MD Requesting Provider: n/a Chief Complaint: RLQ pain and diarrhea.  History of Present Illness:   Patient complains of diarrhea that started last week, which is now more constipation. She states that she is ahving constat nasuea and belching. The RLQ abdominal pain is now radiating to her lower back. She states that when she had the diarrhea last week she seen some mucous in her stool.  No urinary sxs +globus just had left shoulder surgery explosive stool today Pepto bismol no help pills have "hurt my stomach" - hydrocodone nauseated all the time she is holding metamucil has not tried Imodium + nocturnal diarrhea last week  a bit better   GI Review of Systems    Reports abdominal pain, belching, nausea, and  weight loss.     Location of  Abdominal pain: RLQ.    Denies acid reflux, bloating, chest pain, dysphagia with liquids, dysphagia with solids, heartburn, loss of appetite, vomiting, vomiting blood, and  weight gain.      Reports constipation and  diarrhea.     Denies anal fissure, black tarry stools, change in bowel habit, diverticulosis, fecal incontinence, heme positive stool, hemorrhoids, irritable bowel syndrome, jaundice, light color stool, liver problems, rectal bleeding, and  rectal pain.    Current Medications (verified): 1)  Metformin Hcl 500 Mg Tabs (Metformin Hcl) .Marland Kitchen.. 1 By Mouth Two Times A Day 2)  Simvastatin 40 Mg Tabs (Simvastatin) .Marland Kitchen.. 1 By Mouth Once Daily 3)  Sertraline Hcl 50 Mg Tabs (Sertraline Hcl) .Marland Kitchen.. 1 By Mouth Once Daily 4)  Lisinopril 20 Mg Tabs (Lisinopril) .Marland Kitchen.. 1 By Mouth Once Daily 5)  Sular 34 Mg Xr24h-Tab (Nisoldipine) .... Take 1 Tablet By Mouth Once A Day 6)  Zetia 10 Mg Tabs (Ezetimibe) .Marland Kitchen.. 1 By Mouth Once Daily 7)  Tambocor 50 Mg Tabs (Flecainide Acetate)  .Marland Kitchen.. 1 By Mouth Two Times A Day/ Pt Due For Appt. 8)  Diazepam 2 Mg Tabs (Diazepam) .Marland Kitchen.. 1 By Mouth As Needed 9)  Meclizine Hcl 25 Mg Tabs (Meclizine Hcl) .Marland Kitchen.. 1 Tablet By Mouth As Needed For Dizziness 10)  Prevacid 15 Mg Cpdr (Lansoprazole) .... One Tablet By Mouth Once Daily 11)  Elmiron 100 Mg Caps (Pentosan Polysulfate Sodium) .... Take 1 Tablet By Mouth Once A Day 12)  Calcium .... Take 1 Tablet By Mouth As Needed 13)  Glycopyrrolate 2 Mg  Tabs (Glycopyrrolate) .... Take 1 Tablet By Mouth Two Times A Day 14)  Metamucil 48.57 % Powd (Psyllium) .... Titrate Up To 1 Tablespoon Qd 15)  Align  Caps (Misc Intestinal Flora Regulat) .... Take Daily, 1 Month Pulse Vs. Continuous 16)  Lyrica 75 Mg Caps (Pregabalin) .... One Tablet By Mouth Once Daily 17)  Hydrocodone-Acetaminophen 5-500 Mg Tabs (Hydrocodone-Acetaminophen) .... As Needed 18)  Flexeril 10 Mg Tabs (Cyclobenzaprine Hcl) .... Take One By Mouth Two Times A Day 19)  Pepto-Bismol 524 Mg/75ml Susp (Bismuth Subsalicylate) .... Take As Needed  Allergies (verified): 1)  ! Sulfa 2)  ! Biaxin 3)  ! Neurontin 4)  ! * Avonex 5)  ! * Decadron 6)  ! * Ivp Dye 7)  ! * Synthroid 8)  ! * Methylpred 9)  ! * Meto 10)  ! Cardizem  Past History:  Past Medical History: Last updated: 06/01/2009 Diverticulosis Hemorrhoids Esophageal Stricture Diabetes Hypertension Irritable Bowel  Syndrome Arthritis Chronic Headaches Depression Fibromyalgia Multiple Sclerosis Spinal Stenosis Interstitial Cystitis Recurrent diverticulitis TIA right eye  Past Surgical History: Breast-Mastectomy-right Tubal Ligation Appendectomy Cholecystectomy Hemorrhoidectomy Hysterectomy Deviated Septum Surgery CTS surgery Cataracts both eye 3 retina surgeries back surgery breast implant Rectocele Cystocele exploratory surgery x 3 surgery on 5th digit left hand x 2 left shoulder surgery Feb 2011 [facility]  Family History: Reviewed history from 10/06/2008  and no changes required. No FH of Colon Cancer: father died from lung cancer Family History of Clotting disorder: Brother  Social History: Reviewed history from 10/06/2008 and no changes required. Patient has never smoked.  Alcohol Use - no Daily Caffeine Use-1 cup daily Illicit Drug Use - no Patient does not get regular exercise.   Review of Systems       ? fever "skin burns and I am hot" left shoulder pain post-op  Vital Signs:  Patient profile:   77 year old female Height:      62 inches Weight:      152.4 pounds BMI:     27.98 Pulse rate:   72 / minute Pulse rhythm:   regular BP sitting:   128 / 62  (left arm) Cuff size:   regular  Vitals Entered By: Harlow Mares CMA Duncan Dull) (October 12, 2009 4:13 PM)  Physical Exam  General:  Well developed, well nourished, no acute distress. Eyes:  anicteric Lungs:  Clear throughout to auscultation. Heart:  Regular rate and rhythm; no murmurs, rubs,  or bruits. Abdomen:  soft and  mildly tender diffuselywithout masses no HSM surgical scars BS+ Neurologic:  Alert and oriented x 3.   Impression & Recommendations:  Problem # 1:  DIARRHEA (ICD-787.91) Assessment Deteriorated She has chronic recurrent problems with this. She thought this was her diverticulitis. I think is probably a flare of her irritable bowel syndrome but it's quite likely she had antibiotics for her shoulder surgery and she could have C. difficile colitis. Will order stool studies to include C. difficile toxin culture and fecal leukocytes. Given that she so nauseated with iron metronidazole empirically at this time and wait for stool studies and she can use of Imodium. Overall she is better than she was last week I think she's sore in her right lower quadrant from the diarrhea and IBS. Orders: T-Culture, C-Diff Toxin A/B [id number]) T-Culture, Stool ([id number]) T-Fecal WBC ([id number])  Problem # 2:  IRRITABLE BOWEL SYNDROME (ICD-564.1) Assessment:  Deteriorated I think her problems are a flare of IBS but we will look for possible infection. She was reassured and will await the stool studies. She knows to call back if things deteriorate further. It sounds like she is anxious about shoulder surgery and PT and I think that is part of this.  Problem # 3:  NAUSEA (ICD-787.02) Assessment: New Promethazine prn  Patient Instructions: 1)  Please go to the basement to have your lab tests drawn today.  STOOL STUDIES  2)  Please pick up your medications at your pharmacy.  3)  We will call you with follow up after we have your lab results. 4)  The medication list was reviewed and reconciled.  All changed / newly prescribed medications were explained.  A complete medication list was provided to the patient / caregiver. Prescriptions: PROMETHAZINE HCL 25 MG TABS (PROMETHAZINE HCL) take 1/2-1 tablet every 6-8 hours as needed for nausea  #30 x 0   Entered by:   Francee Piccolo CMA (AAMA)   Authorized by:   Maryjean Morn  Leone Payor MD, Clementeen Graham   Signed by:   Francee Piccolo CMA (AAMA) on 10/12/2009   Method used:   Electronically to        [facility] [phone]* (retail)       [address]       [place], [place]  [address]       Ph: [phone] or [phone]       Fax: [phone]   RxID:   [id number]

## 2010-09-05 NOTE — Progress Notes (Signed)
Summary: refill request   Phone Note Refill Request Message from:  Patient on April 11, 2010 8:44 AM  Refills Requested: Medication #1:  FLECAINIDE ACETATE 50 MG TABS Take one tablet by mouth every 12 hours. [facility]   Method Requested: Telephone to Pharmacy Initial call taken by: Glynda Jaeger,  April 11, 2010 8:44 AM    Prescriptions: FLECAINIDE ACETATE 50 MG TABS (FLECAINIDE ACETATE) Take one tablet by mouth every 12 hours  #60 x 2   Entered by:   Laurance Flatten CMA   Authorized by:   Laren Boom, MD, [facility]   Signed by:   Laurance Flatten CMA on 04/11/2010   Method used:   Electronically to        [facility] [phone]* (retail)       [address]       [place], [place]  [address]       Ph: [phone] or [phone]       Fax: [phone]   RxID:   [id number]

## 2010-09-05 NOTE — Progress Notes (Signed)
Summary: CT results   Phone Note Call from Patient Call back at Home Phone [phone]   Caller: Patient Call For: Mike Gip Reason for Call: Lab or Test Results Summary of Call: Calling about her CT Scan results Initial call taken by: Karna Christmas,  June 28, 2010 9:56 AM  Follow-up for Phone Call        Called pt twice in a few min time, she answers the phone but isn't saying anything.  I will try later.  Additional Follow-up for Phone Call Additional follow up Details #1::        Notated on the append of the CT scan. Additional Follow-up by: Joselyn Glassman,  June 28, 2010 12:44 PM

## 2010-09-05 NOTE — Progress Notes (Signed)
Summary: triage   Phone Note Call from Patient Call back at Home Phone [phone]   Caller: Patient Call For: Dr. Leone Payor Reason for Call: Talk to Nurse Summary of Call: pt reporting diarrhea, vomiting, and pain in her right side x 1 week Initial call taken by: Vallarie Mare,  October 10, 2009 10:37 AM  Follow-up for Phone Call        Pt c/o RLQ pain x one week.  Dairrhea.  Hx of diverticulitis.  Pt states pain is the same as diverticulitis flare up.  No fever.  No bleeding but states there is alot of mucus in stools.  Pt had shoulder surgery a couple of weeks ago.  She can not drive.  Will have to arrange transportation if needs to come in. Follow-up by: Ashok Cordia RN,  October 10, 2009 11:01 AM  Additional Follow-up for Phone Call Additional follow up Details #1::        I think this sounds more like colitis (?  C. diff) or IBS She would be best served by an evaluation from me or an extender in the office. Additional Follow-up by: Iva Boop MD, Clementeen Graham,  October 10, 2009 12:32 PM    Additional Follow-up for Phone Call Additional follow up Details #2::    LM for pt to call.  Ashok Cordia RN  October 10, 2009 12:48 PM   Patient  scheduled to see Dr Leone Payor 10-12-09 2:45. Patient aware of appointment date and time Follow-up by: Darcey Nora RN, CGRN,  October 10, 2009 1:44 PM

## 2010-09-07 NOTE — Assessment & Plan Note (Signed)
Summary: F/U Diverticulitis, saw PA    History of Present Illness Visit Type: Follow-up Visit Primary GI MD: Stan Head MD [facility] Primary Provider: Merri Brunette, MD Requesting Provider: n/a Chief Complaint: Patient here for f/u diverticulitis. She complains of continue LLQ abdominal pain and diarrhea. History of Present Illness:   77 yo ww here to follow-up diverticulitis. Recurrent problem and diagnosed (descending colon) by abd/pelvic CT 06/27/10. Better but still sore in LLQ and RLQ. Significant gascramps sill. Relieved with flatus passage. constipated for 3 days then develeoped diarrea and treated with Imodium several days ago. That has resolved. Felt hot when she had the diarrhea. No tempreature taken. Back to baseline (mostly) now.    GI Review of Systems    Reports abdominal pain, acid reflux, belching, bloating, and  nausea.     Location of  Abdominal pain: epigastric/LLQ.    Denies chest pain, dysphagia with liquids, dysphagia with solids, heartburn, loss of appetite, vomiting, vomiting blood, weight loss, and  weight gain.      Reports change in bowel habits, diarrhea, and  diverticulosis.     Denies anal fissure, black tarry stools, constipation, fecal incontinence, heme positive stool, hemorrhoids, irritable bowel syndrome, jaundice, light color stool, liver problems, rectal bleeding, and  rectal pain.    CT Abdomen/Pelvis  Procedure date:  06/27/2010  Findings:       1.  Descending colon diverticulitis without abscess.  Follow up recommended to confirm appropriate resolution and   exclude underlying mucosal lesion. 2.  Stable benign splenic and hepatic cystic lesions. 3.  Stable hyperdense renal lesions. 4.  Stable peri urethral calcification, possible calculus.  Colonoscopy  Procedure date:  10/15/2008  Findings:      1) Severe diverticulosis. Mild in right colon, severe in sigmoid colon. 2) Internal hemorrhoids 3) Otherwise normal  examination  EGD  Procedure date:  06/09/2009  Findings:          1) Stenosis at the gastroesophageal junction, dilated to 54     French     2) Tortuous esophagus in the total esophagus     3) Polyps in the fundus, look like benign fundic gland polyps     (biospied)     4) Otherwise normal examination.   Current Medications (verified): 1)  Metformin Hcl 500 Mg Tabs (Metformin Hcl) .Marland Kitchen.. 1 By Mouth Two Times A Day 2)  Simvastatin 40 Mg Tabs (Simvastatin) .Marland Kitchen.. 1 By Mouth Once Daily 3)  Sertraline Hcl 50 Mg Tabs (Sertraline Hcl) .Marland Kitchen.. 1 By Mouth Once Daily 4)  Lisinopril 20 Mg Tabs (Lisinopril) .Marland Kitchen.. 1 By Mouth Once Daily 5)  Sular 34 Mg Xr24h-Tab (Nisoldipine) .... Take 1 Tablet By Mouth Once A Day 6)  Zetia 10 Mg Tabs (Ezetimibe) .Marland Kitchen.. 1 By Mouth Once Daily 7)  Tambocor 50 Mg Tabs (Flecainide Acetate) .Marland Kitchen.. 1 By Mouth Two Times A Day/ Pt Due For Appt. 8)  Diazepam 2 Mg Tabs (Diazepam) .Marland Kitchen.. 1 By Mouth As Needed 9)  Meclizine Hcl 25 Mg Tabs (Meclizine Hcl) .Marland Kitchen.. 1 Tablet By Mouth As Needed For Dizziness 10)  Prevacid 15 Mg Cpdr (Lansoprazole) .... One Tablet By Mouth Once Daily 11)  Elmiron 100 Mg Caps (Pentosan Polysulfate Sodium) .... Take 1 Tablet By Mouth Once A Day 12)  Calcium .... Take 1 Tablet By Mouth As Needed 13)  Glycopyrrolate 2 Mg  Tabs (Glycopyrrolate) .... Take 1 Tablet By Mouth Two Times A Day 14)  Metamucil 48.57 % Powd (Psyllium) .... Titrate Up To  1 Tablespoon Qd 15)  Lyrica 75 Mg Caps (Pregabalin) .... One Tablet By Mouth Once Daily 16)  Hydrocodone-Acetaminophen 5-500 Mg Tabs (Hydrocodone-Acetaminophen) .... As Needed 17)  Flexeril 10 Mg Tabs (Cyclobenzaprine Hcl) .... Take One By Mouth Two Times A Day 18)  Pepto-Bismol 524 Mg/55ml Susp (Bismuth Subsalicylate) .... Take As Needed 19)  Promethazine Hcl 25 Mg Tabs (Promethazine Hcl) .... Take 1/2-1 Tablet Every 6-8 Hours As Needed For Nausea 20)  Imodium A-D 2 Mg Tabs (Loperamide Hcl) .... Take 1 Tablet As Needed Up To 4  Tablets Per Day 21)  Flecainide Acetate 50 Mg Tabs (Flecainide Acetate) .... Take One Tablet By Mouth Every 12 Hours 22)  Cipro 500 Mg Tabs (Ciprofloxacin Hcl) .... Take 1 Tab Twice Daily X 14 Days  Allergies (verified): 1)  ! Sulfa 2)  ! Biaxin 3)  ! Neurontin 4)  ! * Avonex 5)  ! * Decadron 6)  ! * Ivp Dye 7)  ! * Synthroid 8)  ! * Methylpred 9)  ! * Meto 10)  ! Cardizem  Past History:  Past Medical History: Reviewed history from 06/26/2010 and no changes required. SEVERE DIVERTICULOSIS HX BREAST CANCER Hemorrhoids Esophageal Stricture/S/P DILATION AODM Hypertension Irritable Bowel Syndrome Arthritis Chronic Headaches Depression Fibromyalgia Multiple Sclerosis Spinal Stenosis Interstitial Cystitis  TIA right eye  Past Surgical History: Breast-Mastectomy-right Tubal Ligation Appendectomy Cholecystectomy Hemorrhoidectomy Hysterectomy Deviated Septum Surgery CTS surgery Cataracts both eye 3 retina surgeries back surgery breast implant-right Rectocele Repair Cystocele Repair exploratory surgery x 3 surgery on 5th digit left hand x 2 left shoulder surgery Feb 2011 [facility] surgery-left  Family History: Reviewed history from 10/06/2008 and no changes required. No FH of Colon Cancer: father died from lung cancer Family History of Clotting disorder: Brother  Social History: Reviewed history from 10/06/2008 and no changes required. Patient has never smoked.  Alcohol Use - no Daily Caffeine Use-1 cup daily Illicit Drug Use - no Patient does not get regular exercise.   Vital Signs:  Patient profile:   77 year old female Height:      62 inches Weight:      155.25 pounds BMI:     28.50 BSA:     1.72 Pulse rate:   84 / minute Pulse rhythm:   regular BP sitting:   124 / 66  (left arm)  Vitals Entered By: Lamona Curl CMA Duncan Dull) (July 18, 2010 2:25 PM)  Physical Exam  General:  elderly NAD Abdomen:  soft, mildly protuberant,  BS+ tender LLQ and sore all over no rebound or guarding not dissimilar to other exams   Impression & Recommendations:  Problem # 1:  RECURRENT DIVERTICULITIS-COLON (ICD-562.11) Assessment Improved Multiple episodes over the past 1--2 years though at times difficult to sort IBs vs. diverticulitis (though CT's have proven). She has seen Wenda Low and not thought to be a surgical candidate (would require subtotal colectomyif so) I will review those records she is not a great operative candidate  I think she is clinically resolved and back to baseline IBS ? if any sense to try ASA therapy as aminosalicylates have been used to reduce recurrent diverticulitis though not clearly established in that role. Other option would be intermittent rifamaxin antibiotic therapy. After reviewing theis I plan to offer her 5-ASA therapy.  recheck CT 6 weeks after last  Orders: CT Abdomen/Pelvis w/o Contrast (CT ABD/Pel w/o con)  Problem # 2:  IRRITABLE BOWEL SYNDROME (ICD-564.1) Assessment: Unchanged no chnage in Tx  Patient Instructions:  1)  Your CT is scheduled at [facility] CT on 08/09/10 @ 9:30am.  See seperate instructions. 2)  We will call you with further follow up after reviewing these results.  3)  Copy sent to : Merri Brunette, MD, Wenda Low, MD 4)  The medication list was reviewed and reconciled.  All changed / newly prescribed medications were explained.  A complete medication list was provided to the patient / caregiver.

## 2010-09-07 NOTE — Progress Notes (Signed)
Summary: Questions   Phone Note Call from Patient Call back at Home Phone [phone]   Caller: Patient Call For: Dr. Leone Payor Reason for Call: Talk to Nurse Summary of Call: Pt says she had missed call from [place] with no message, wants to know why we were calling says it may have been a reminder for her CT on 08/09/10 Initial call taken by: [place] Johnson,  August 02, 2010 10:27 AM  Follow-up for Phone Call        I spoke with the patient and told her that as far as I can tell from the chart that we were not trying to reach her.  She  will call back if she has any other questions. Follow-up by: Darcey Nora RN, CGRN,  August 02, 2010 11:13 AM

## 2010-09-07 NOTE — Assessment & Plan Note (Signed)
Summary: rov. gd  Medications Added NISOLDIPINE 30 MG XR24H-TAB (NISOLDIPINE) once daily OMEPRAZOLE 20 MG CPDR (OMEPRAZOLE) once daily FUROSEMIDE 20 MG TABS (FUROSEMIDE) Take one tablet by mouth daily. as needed DICYCLOMINE HCL 20 MG TABS (DICYCLOMINE HCL) once daily NASACORT AQ 55 MCG/ACT AERS (TRIAMCINOLONE ACETONIDE) UAD ZYRTEC HIVES RELIEF 10 MG TABS (CETIRIZINE HCL) UAD URELLE 81 MG TABS (METH-HYO-M BL-NA PHOS-PH SAL) 4 times a day        Visit Type:  Follow-up Referring Provider:  n/a Primary Provider:  Merri Brunette, MD   History of Present Illness: [name] returns today for followup.  She is a pleasant elderly woman with a h/o palpitations and documented atrial tachycardia.  She has been treated with flecainide therapy at low dose for over a year.  She denies c/p or sob.  Her main problem has been related to diarrhea and she has widespread diverticulosis and has been considered for total colectomy.  She was recently hospitalized but has refused colectomy. Currently her symptoms are not too bad and she is being observed.  She has almost no palpitations on the flecainide.  Current Medications (verified): 1)  Metformin Hcl 500 Mg Tabs (Metformin Hcl) .Marland Kitchen.. 1 By Mouth Two Times A Day 2)  Simvastatin 40 Mg Tabs (Simvastatin) .Marland Kitchen.. 1 By Mouth Once Daily 3)  Sertraline Hcl 50 Mg Tabs (Sertraline Hcl) .Marland Kitchen.. 1 By Mouth Once Daily 4)  Lisinopril 20 Mg Tabs (Lisinopril) .Marland Kitchen.. 1 By Mouth Once Daily 5)  Nisoldipine 30 Mg Xr24h-Tab (Nisoldipine) .... Once Daily 6)  Zetia 10 Mg Tabs (Ezetimibe) .Marland Kitchen.. 1 By Mouth Once Daily 7)  Tambocor 50 Mg Tabs (Flecainide Acetate) .Marland Kitchen.. 1 By Mouth Two Times A Day/ Pt Due For Appt. 8)  Diazepam 2 Mg Tabs (Diazepam) .Marland Kitchen.. 1 By Mouth As Needed 9)  Meclizine Hcl 25 Mg Tabs (Meclizine Hcl) .Marland Kitchen.. 1 Tablet By Mouth As Needed For Dizziness 10)  Omeprazole 20 Mg Cpdr (Omeprazole) .... Once Daily 11)  Elmiron 100 Mg Caps (Pentosan Polysulfate Sodium) .... Take 1 Tablet  By Mouth Once A Day 12)  Lyrica 75 Mg Caps (Pregabalin) .... One Tablet By Mouth Once Daily 13)  Hydrocodone-Acetaminophen 5-500 Mg Tabs (Hydrocodone-Acetaminophen) .... As Needed 14)  Pepto-Bismol 524 Mg/104ml Susp (Bismuth Subsalicylate) .... Take As Needed 15)  Promethazine Hcl 25 Mg Tabs (Promethazine Hcl) .... Take 1/2-1 Tablet Every 6-8 Hours As Needed For Nausea 16)  Imodium A-D 2 Mg Tabs (Loperamide Hcl) .... Take 1 Tablet As Needed Up To 4 Tablets Per Day 17)  Flecainide Acetate 50 Mg Tabs (Flecainide Acetate) .... Take One Tablet By Mouth Every 12 Hours 18)  Furosemide 20 Mg Tabs (Furosemide) .... Take One Tablet By Mouth Daily. As Needed 19)  Dicyclomine Hcl 20 Mg Tabs (Dicyclomine Hcl) .... Once Daily 20)  Nasacort Aq 55 Mcg/act Aers (Triamcinolone Acetonide) .... Uad 21)  Zyrtec Hives Relief 10 Mg Tabs (Cetirizine Hcl) .... Uad 22)  Urelle 81 Mg Tabs (Meth-Hyo-M Bl-Na Phos-Ph Sal) .... 4 Times A Day  Allergies: 1)  ! Sulfa 2)  ! Biaxin 3)  ! Neurontin 4)  ! * Avonex 5)  ! * Decadron 6)  ! * Ivp Dye 7)  ! * Synthroid 8)  ! * Methylpred 9)  ! * Meto 10)  ! Cardizem  Past History:  Past Medical History: Last updated: 06/26/2010 SEVERE DIVERTICULOSIS HX BREAST CANCER Hemorrhoids Esophageal Stricture/S/P DILATION AODM Hypertension Irritable Bowel Syndrome Arthritis Chronic Headaches Depression Fibromyalgia Multiple Sclerosis Spinal Stenosis Interstitial  Cystitis  TIA right eye  Past Surgical History: Last updated: 07/18/2010 Breast-Mastectomy-right Tubal Ligation Appendectomy Cholecystectomy Hemorrhoidectomy Hysterectomy Deviated Septum Surgery CTS surgery Cataracts both eye 3 retina surgeries back surgery breast implant-right Rectocele Repair Cystocele Repair exploratory surgery x 3 surgery on 5th digit left hand x 2 left shoulder surgery Feb 2011 [facility] surgery-left  Review of Systems  The patient denies chest pain, syncope, dyspnea on  exertion, and peripheral edema.    Vital Signs:  Patient profile:   77 year old female Height:      62 inches Weight:      158 pounds BMI:     29.00 Pulse rate:   73 / minute BP sitting:   110 / 62  (left arm)  Vitals Entered By: Laurance Flatten CMA (August 24, 2010 3:13 PM)  Physical Exam  General:  elderly NAD Head:  Normocephalic and atraumatic. Eyes:  PERRLA, no icterus. Mouth:  No deformity or lesions, dentition normal. Neck:  Supple; no masses or thyromegaly. Lungs:  Clear throughout to auscultation. No wheezes, rales, or rhonchi. Heart:  Regular rate and rhythm; no murmurs, rubs,  or bruits. Abdomen:  soft, mildly protuberant, BS+ tender LLQ and sore all over no rebound or guarding not dissimilar to other exams Pulses:  pulses normal in all 4 extremities Extremities:  No clubbing, cyanosis, edema or deformities noted. Neurologic:  Alert and  oriented x4;  grossly normal neurologically.   Impression & Recommendations:  Problem # 1:  HYPERTENSION (ICD-401.9) Her pressure is well controlled. Will continue meds as below. Her updated medication list for this problem includes:    Lisinopril 20 Mg Tabs (Lisinopril) .Marland Kitchen... 1 by mouth once daily    Nisoldipine 30 Mg Xr24h-tab (Nisoldipine) ..... Once daily    Furosemide 20 Mg Tabs (Furosemide) .Marland Kitchen... Take one tablet by mouth daily. as needed  Problem # 2:  PSVT (ICD-427.0) Her symptoms are well controlled on low dose flecainide. Will continue meds as below. Her updated medication list for this problem includes:    Lisinopril 20 Mg Tabs (Lisinopril) .Marland Kitchen... 1 by mouth once daily    Nisoldipine 30 Mg Xr24h-tab (Nisoldipine) ..... Once daily    Tambocor 50 Mg Tabs (Flecainide acetate) .Marland Kitchen... 1 by mouth two times a day/ pt due for appt.    Flecainide Acetate 50 Mg Tabs (Flecainide acetate) .Marland Kitchen... Take one tablet by mouth every 12 hours  Patient Instructions: 1)  Your physician recommends that you continue on your current medications  as directed. Please refer to the Current Medication list given to you today. 2)  Your physician wants you to follow-up in: 1 year.  You will receive a reminder letter in the mail two months in advance. If you don't receive a letter, please call our office to schedule the follow-up appointment.

## 2010-09-07 NOTE — Progress Notes (Signed)
Summary: add asacol   Phone Note Outgoing Call   Summary of Call: Please call her an tell her i would like her to try asacol 400 mg tabs 2 by mouth two times a day to try to reduce recurrent diverticulitis. Not a proven therapy for sure but studies have suggested it can help and since she is not a good operative candidate I think she should try it. If agreeable then Rx it 2 by mouth two times a day # 120 with 5 refills. we will talk to her more after she has her CT Iva Boop MD, [facility]  July 18, 2010 9:27 PM   Follow-up for Phone Call        I spoke to pt's husband who had only phone with him at a doctor's appt.  He will be home in about an hour and we can call pt back at that time. Francee Piccolo CMA Duncan Dull)  July 19, 2010 10:36 AM   Notified pt of above recommendations.  Pt is agreeable to trying Asacol. This is called to pharmacy.  Pt asks if she may refrigerate her contrast and I advised this is OK.  Advised pt we would follow up with her after her CT, but if she has any problems before then to let us know. Follow-up by: Francee Piccolo CMA Duncan Dull),  July 19, 2010 1:56 PM    New/Updated Medications: ASACOL 400 MG TBEC (MESALAMINE) Take 2 tablets by mouth two times a day Prescriptions: ASACOL 400 MG TBEC (MESALAMINE) Take 2 tablets by mouth two times a day  #120 x 5   Entered by:   Francee Piccolo CMA (AAMA)   Authorized by:   Iva Boop MD, [facility]   Signed by:   Francee Piccolo CMA (AAMA) on 07/19/2010   Method used:   Electronically to        [facility] [phone]* (retail)       [address] [place], [place]  [address]       Ph: [phone] or [phone]       Fax: [phone]   RxID:   [id number]

## 2010-09-07 NOTE — Progress Notes (Signed)
Summary: med concern   Phone Note Call from Patient Call back at Home Phone [phone]   Caller: Patient Call For: Dr. Leone Payor Reason for Call: Talk to Nurse Summary of Call: cannot afford new medication... doesnt recall med name... [facility] on [address]. Initial call taken by: Vallarie Mare,  July 20, 2010 1:35 PM  Follow-up for Phone Call        LM to [facility] at home number Francee Piccolo CMA Duncan Dull)  July 20, 2010 2:49 PM   RC from pt.  Asacol is >$200.  We have samples of Asacol HD.  Would pt be able to take samples until her CT on 08/09/10? Dr. Leone Payor, please advise. thanks Follow-up by: Francee Piccolo CMA Duncan Dull),  July 20, 2010 2:58 PM  Additional Follow-up for Phone Call Additional follow up Details #1::        no  this won't be a good long-term solution so will not start at this time. It is a medication that might prevent recurrences of the diverticulitis but not proven. Iva Boop MD, [facility]  July 20, 2010 3:02 PM     Additional Follow-up for Phone Call Additional follow up Details #2::    notified pt of above.  Asacol rx discontinued with [facility]. Follow-up by: Francee Piccolo CMA Duncan Dull),  July 20, 2010 3:13 PM

## 2010-10-23 LAB — BASIC METABOLIC PANEL
BUN: 17 mg/dL (ref 6–23)
Chloride: 107 mEq/L (ref 96–112)
Glucose, Bld: 100 mg/dL — ABNORMAL HIGH (ref 70–99)
Potassium: 5 mEq/L (ref 3.5–5.1)

## 2010-10-30 ENCOUNTER — Other Ambulatory Visit: Payer: Self-pay | Admitting: Family Medicine

## 2010-10-30 ENCOUNTER — Ambulatory Visit
Admission: RE | Admit: 2010-10-30 | Discharge: 2010-10-30 | Disposition: A | Discharge status: Home / Self Care | Payer: Medicare Other | Source: Ambulatory Visit | Attending: Family Medicine | Admitting: Family Medicine

## 2010-10-30 DIAGNOSIS — R29898 Other symptoms and signs involving the musculoskeletal system: Secondary | ICD-10-CM

## 2010-11-08 LAB — GLUCOSE, CAPILLARY: Glucose-Capillary: 88 mg/dL (ref 70–99)

## 2010-11-10 LAB — DIFFERENTIAL
Basophils Absolute: 0 10*3/uL (ref 0.0–0.1)
Basophils Relative: 0 % (ref 0–1)
Eosinophils Absolute: 0 10*3/uL (ref 0.0–0.7)
Lymphs Abs: 1.5 10*3/uL (ref 0.7–4.0)
Monocytes Absolute: 0.6 10*3/uL (ref 0.1–1.0)
Monocytes Relative: 8 % (ref 3–12)
Neutro Abs: 5.3 10*3/uL (ref 1.7–7.7)
Neutro Abs: 7.3 10*3/uL (ref 1.7–7.7)
Neutrophils Relative %: 71 % (ref 43–77)
Neutrophils Relative %: 76 % (ref 43–77)

## 2010-11-10 LAB — CLOSTRIDIUM DIFFICILE EIA: C difficile Toxins A+B, EIA: NEGATIVE

## 2010-11-10 LAB — COMPREHENSIVE METABOLIC PANEL
ALT: 14 U/L (ref 0–35)
AST: 14 U/L (ref 0–37)
Alkaline Phosphatase: 42 U/L (ref 39–117)
Alkaline Phosphatase: 43 U/L (ref 39–117)
BUN: 8 mg/dL (ref 6–23)
CO2: 24 mEq/L (ref 19–32)
CO2: 24 mEq/L (ref 19–32)
Calcium: 9.4 mg/dL (ref 8.4–10.5)
Chloride: 110 mEq/L (ref 96–112)
GFR calc Af Amer: >60 mL/min (ref >60)
GFR calc non Af Amer: >60 mL/min (ref >60)
GFR calc non Af Amer: >60 mL/min (ref >60)
Glucose, Bld: 101 mg/dL — ABNORMAL HIGH (ref 70–99)
Potassium: 4 mEq/L (ref 3.5–5.1)
Potassium: 4.1 mEq/L (ref 3.5–5.1)
Sodium: 139 mEq/L (ref 135–145)
Total Bilirubin: 0.4 mg/dL (ref 0.3–1.2)
Total Protein: 6.5 g/dL (ref 6.0–8.3)

## 2010-11-10 LAB — URINALYSIS, ROUTINE W REFLEX MICROSCOPIC
Glucose, UA: NEGATIVE mg/dL
Hgb urine dipstick: NEGATIVE
Nitrite: NEGATIVE
Protein, ur: NEGATIVE mg/dL
Protein, ur: NEGATIVE mg/dL
Specific Gravity, Urine: 1.01 (ref 1.005–1.030)
Specific Gravity, Urine: 1.026 (ref 1.005–1.030)
Urobilinogen, UA: 0.2 mg/dL (ref 0.0–1.0)

## 2010-11-10 LAB — HEMOGLOBIN A1C
Hgb A1c MFr Bld: 7 % — ABNORMAL HIGH (ref 4.6–6.1)
Mean Plasma Glucose: 154 mg/dL

## 2010-11-10 LAB — BASIC METABOLIC PANEL
BUN: 6 mg/dL (ref 6–23)
BUN: 7 mg/dL (ref 6–23)
BUN: <1 mg/dL — ABNORMAL LOW (ref 6–23)
CO2: 24 mEq/L (ref 19–32)
CO2: 25 mEq/L (ref 19–32)
Calcium: 8.4 mg/dL (ref 8.4–10.5)
Calcium: 8.6 mg/dL (ref 8.4–10.5)
Calcium: 8.8 mg/dL (ref 8.4–10.5)
GFR calc Af Amer: >60 mL/min (ref >60)
GFR calc Af Amer: >60 mL/min (ref >60)
GFR calc non Af Amer: 58 mL/min — ABNORMAL LOW (ref >60)
GFR calc non Af Amer: >60 mL/min (ref >60)
GFR calc non Af Amer: >60 mL/min (ref >60)
GFR calc non Af Amer: >60 mL/min (ref >60)
GFR calc non Af Amer: >60 mL/min (ref >60)
GFR calc non Af Amer: >60 mL/min (ref >60)
Glucose, Bld: 122 mg/dL — ABNORMAL HIGH (ref 70–99)
Glucose, Bld: 130 mg/dL — ABNORMAL HIGH (ref 70–99)
Glucose, Bld: 82 mg/dL (ref 70–99)
Glucose, Bld: 91 mg/dL (ref 70–99)
Potassium: 3.2 mEq/L — ABNORMAL LOW (ref 3.5–5.1)
Potassium: 3.2 mEq/L — ABNORMAL LOW (ref 3.5–5.1)
Potassium: 3.2 mEq/L — ABNORMAL LOW (ref 3.5–5.1)
Potassium: 4 mEq/L (ref 3.5–5.1)
Sodium: 137 mEq/L (ref 135–145)
Sodium: 139 mEq/L (ref 135–145)
Sodium: 143 mEq/L (ref 135–145)
Sodium: 144 mEq/L (ref 135–145)

## 2010-11-10 LAB — GLUCOSE, CAPILLARY
Glucose-Capillary: 102 mg/dL — ABNORMAL HIGH (ref 70–99)
Glucose-Capillary: 102 mg/dL — ABNORMAL HIGH (ref 70–99)
Glucose-Capillary: 105 mg/dL — ABNORMAL HIGH (ref 70–99)
Glucose-Capillary: 110 mg/dL — ABNORMAL HIGH (ref 70–99)
Glucose-Capillary: 110 mg/dL — ABNORMAL HIGH (ref 70–99)
Glucose-Capillary: 110 mg/dL — ABNORMAL HIGH (ref 70–99)
Glucose-Capillary: 111 mg/dL — ABNORMAL HIGH (ref 70–99)
Glucose-Capillary: 114 mg/dL — ABNORMAL HIGH (ref 70–99)
Glucose-Capillary: 134 mg/dL — ABNORMAL HIGH (ref 70–99)
Glucose-Capillary: 143 mg/dL — ABNORMAL HIGH (ref 70–99)
Glucose-Capillary: 81 mg/dL (ref 70–99)
Glucose-Capillary: 82 mg/dL (ref 70–99)
Glucose-Capillary: 85 mg/dL (ref 70–99)
Glucose-Capillary: 85 mg/dL (ref 70–99)
Glucose-Capillary: 85 mg/dL (ref 70–99)
Glucose-Capillary: 87 mg/dL (ref 70–99)
Glucose-Capillary: 87 mg/dL (ref 70–99)
Glucose-Capillary: 92 mg/dL (ref 70–99)
Glucose-Capillary: 95 mg/dL (ref 70–99)
Glucose-Capillary: 98 mg/dL (ref 70–99)

## 2010-11-10 LAB — CBC
HCT: 36.4 % (ref 36.0–46.0)
HCT: 37.8 % (ref 36.0–46.0)
Hemoglobin: 12.1 g/dL (ref 12.0–15.0)
Hemoglobin: 12.7 g/dL (ref 12.0–15.0)
MCV: 90 fL (ref 78.0–100.0)
MCV: 90.3 fL (ref 78.0–100.0)
Platelets: 204 10*3/uL (ref 150–400)
RBC: 4.08 MIL/uL (ref 3.87–5.11)
RBC: 4.18 MIL/uL (ref 3.87–5.11)
RBC: 4.18 MIL/uL (ref 3.87–5.11)
WBC: 3.9 10*3/uL — ABNORMAL LOW (ref 4.0–10.5)
WBC: 4.7 10*3/uL (ref 4.0–10.5)
WBC: 7.5 10*3/uL (ref 4.0–10.5)
WBC: 9.6 10*3/uL (ref 4.0–10.5)

## 2010-11-10 LAB — URINE CULTURE

## 2010-11-10 LAB — MAGNESIUM: Magnesium: 1.6 mg/dL (ref 1.5–2.5)

## 2010-11-16 LAB — GLUCOSE, CAPILLARY: Glucose-Capillary: 119 mg/dL — ABNORMAL HIGH (ref 70–99)

## 2010-11-20 LAB — URINALYSIS, ROUTINE W REFLEX MICROSCOPIC
Glucose, UA: NEGATIVE mg/dL
Hgb urine dipstick: NEGATIVE
Ketones, ur: NEGATIVE mg/dL
Protein, ur: NEGATIVE mg/dL
Urobilinogen, UA: 0.2 mg/dL (ref 0.0–1.0)

## 2010-11-20 LAB — GLUCOSE, CAPILLARY
Glucose-Capillary: 103 mg/dL — ABNORMAL HIGH (ref 70–99)
Glucose-Capillary: 104 mg/dL — ABNORMAL HIGH (ref 70–99)
Glucose-Capillary: 108 mg/dL — ABNORMAL HIGH (ref 70–99)
Glucose-Capillary: 108 mg/dL — ABNORMAL HIGH (ref 70–99)
Glucose-Capillary: 109 mg/dL — ABNORMAL HIGH (ref 70–99)
Glucose-Capillary: 121 mg/dL — ABNORMAL HIGH (ref 70–99)
Glucose-Capillary: 129 mg/dL — ABNORMAL HIGH (ref 70–99)
Glucose-Capillary: 133 mg/dL — ABNORMAL HIGH (ref 70–99)
Glucose-Capillary: 134 mg/dL — ABNORMAL HIGH (ref 70–99)
Glucose-Capillary: 175 mg/dL — ABNORMAL HIGH (ref 70–99)
Glucose-Capillary: 83 mg/dL (ref 70–99)
Glucose-Capillary: 86 mg/dL (ref 70–99)
Glucose-Capillary: 95 mg/dL (ref 70–99)

## 2010-11-20 LAB — CBC
HCT: 35.4 % — ABNORMAL LOW (ref 36.0–46.0)
HCT: 37 % (ref 36.0–46.0)
HCT: 37.7 % (ref 36.0–46.0)
HCT: 40.1 % (ref 36.0–46.0)
Hemoglobin: 11.6 g/dL — ABNORMAL LOW (ref 12.0–15.0)
Hemoglobin: 12.7 g/dL (ref 12.0–15.0)
Hemoglobin: 13 g/dL (ref 12.0–15.0)
MCHC: 32.5 g/dL (ref 30.0–36.0)
MCHC: 32.8 g/dL (ref 30.0–36.0)
MCHC: 33.8 g/dL (ref 30.0–36.0)
MCV: 90.6 fL (ref 78.0–100.0)
MCV: 91 fL (ref 78.0–100.0)
Platelets: 175 10*3/uL (ref 150–400)
RBC: 3.91 MIL/uL (ref 3.87–5.11)
RBC: 4.4 MIL/uL (ref 3.87–5.11)
RDW: 14.6 % (ref 11.5–15.5)
RDW: 15.3 % (ref 11.5–15.5)
WBC: 12.1 10*3/uL — ABNORMAL HIGH (ref 4.0–10.5)
WBC: 6 10*3/uL (ref 4.0–10.5)

## 2010-11-20 LAB — COMPREHENSIVE METABOLIC PANEL
AST: 16 U/L (ref 0–37)
BUN: 12 mg/dL (ref 6–23)
CO2: 25 mEq/L (ref 19–32)
Calcium: 9 mg/dL (ref 8.4–10.5)
Chloride: 107 mEq/L (ref 96–112)
Creatinine, Ser: 0.81 mg/dL (ref 0.4–1.2)
GFR calc non Af Amer: >60 mL/min (ref >60)
Glucose, Bld: 133 mg/dL — ABNORMAL HIGH (ref 70–99)
Total Bilirubin: 0.7 mg/dL (ref 0.3–1.2)

## 2010-11-20 LAB — DIFFERENTIAL
Basophils Absolute: 0 10*3/uL (ref 0.0–0.1)
Basophils Absolute: 0 10*3/uL (ref 0.0–0.1)
Basophils Absolute: 0 10*3/uL (ref 0.0–0.1)
Basophils Relative: 0 % (ref 0–1)
Eosinophils Relative: 0 % (ref 0–5)
Eosinophils Relative: 0 % (ref 0–5)
Lymphocytes Relative: 11 % — ABNORMAL LOW (ref 12–46)
Lymphocytes Relative: 25 % (ref 12–46)
Lymphs Abs: 1.4 10*3/uL (ref 0.7–4.0)
Lymphs Abs: 1.5 10*3/uL (ref 0.7–4.0)
Monocytes Absolute: 0.7 10*3/uL (ref 0.1–1.0)
Neutro Abs: 10 10*3/uL — ABNORMAL HIGH (ref 1.7–7.7)
Neutro Abs: 4 10*3/uL (ref 1.7–7.7)
Neutrophils Relative %: 82 % — ABNORMAL HIGH (ref 43–77)

## 2010-11-20 LAB — BASIC METABOLIC PANEL
BUN: 8 mg/dL (ref 6–23)
CO2: 23 mEq/L (ref 19–32)
CO2: 25 mEq/L (ref 19–32)
CO2: 28 mEq/L (ref 19–32)
Calcium: 8.3 mg/dL — ABNORMAL LOW (ref 8.4–10.5)
Calcium: 8.5 mg/dL (ref 8.4–10.5)
Chloride: 109 mEq/L (ref 96–112)
Chloride: 111 mEq/L (ref 96–112)
GFR calc Af Amer: >60 mL/min (ref >60)
GFR calc non Af Amer: >60 mL/min (ref >60)
GFR calc non Af Amer: >60 mL/min (ref >60)
Glucose, Bld: 107 mg/dL — ABNORMAL HIGH (ref 70–99)
Glucose, Bld: 118 mg/dL — ABNORMAL HIGH (ref 70–99)
Glucose, Bld: 124 mg/dL — ABNORMAL HIGH (ref 70–99)
Glucose, Bld: 73 mg/dL (ref 70–99)
Potassium: 4 mEq/L (ref 3.5–5.1)
Potassium: 4 mEq/L (ref 3.5–5.1)
Potassium: 4 mEq/L (ref 3.5–5.1)
Potassium: 4.2 mEq/L (ref 3.5–5.1)
Sodium: 138 mEq/L (ref 135–145)
Sodium: 141 mEq/L (ref 135–145)
Sodium: 143 mEq/L (ref 135–145)
Sodium: 144 mEq/L (ref 135–145)

## 2010-11-20 LAB — LIPASE, BLOOD: Lipase: 68 U/L — ABNORMAL HIGH (ref 11–59)

## 2010-11-20 LAB — URINE MICROSCOPIC-ADD ON

## 2010-11-20 LAB — PROTIME-INR: INR: 1.1 (ref 0.00–1.49)

## 2010-11-20 LAB — CULTURE, BLOOD (ROUTINE X 2)
Culture: NO GROWTH
Culture: NO GROWTH

## 2010-11-20 LAB — AMYLASE: Amylase: 127 U/L (ref 27–131)

## 2010-12-19 NOTE — Assessment & Plan Note (Signed)
[facility]                         ELECTROPHYSIOLOGY OFFICE NOTE   NAME:Baldini, CARLE DARGAN                      MRN:          [id number]  DATE:12/10/2007                            DOB:          1929-08-17    Ms. Heslin returns today for follow-up.  She is a very pleasant elderly  woman with a history of SVT and palpitations and atrial tachycardia.  When I saw her back in early April we recommended that we try her on  some flecainide.  She returns today for follow-up.  She thinks that she  is having more diarrhea than she had previously had.  She denies any  significant palpitations since start of the flecainide.  She denies  chest pain or shortness of breath.   MEDICATIONS:  1. Metformin 500 mg twice daily.  2. Simvastatin 40 mg a day.  3. Detrol LA 4 mg daily.  4. Lisinopril 20 mg a day.  5. Requip 1 mg nightly.  6. Zetia 10 mg daily.  7. Sular 34 mg daily.  8. Actonel 35 mg weekly.  9. Flecainide mg 50 twice a day.   PHYSICAL EXAM:  She is a pleasant, elderly-appearing woman in no acute  distress.  Blood pressure was 116/60, the pulse was 80 and regular, respirations  were 18.  The weight was 159 pounds.  NECK:  No jugular venous distention.  LUNGS:  Clear bilaterally to auscultation.  No wheezes, rales or rhonchi  are present.  CARDIOVASCULAR:  A regular rate and rhythm with a normal S1 and S2.  EXTREMITIES:  No edema.   IMPRESSION:  1. Palpitations and symptomatic supraventricular tachycardia/atrial      tachycardia.  2. Flecainide therapy secondary to #1.  3. Hypertension.  4. Diarrhea, questionably related to flecainide.   DISCUSSION:  I have asked that Ms. Marcel stop her flecainide for 2  weeks to see if her diarrhea improves.  As she had had diarrhea before  starting flecainide, I am not sure that the flecainide has anything to  do with her diarrhea but I think it is worth stopping flecainide  initially to  see whether or  not she has difficulty with it.  I have asked that if her  diarrhea is unchanged that she go back to flecainide 50 mg twice a day.  I will see her back in several months.     Doylene Canning. Ladona Ridgel, MD  Electronically Signed    GWT/MedQ  DD: 12/10/2007  DT: 12/10/2007  Job #: [id number]   cc:   Armanda Magic, M.D.

## 2010-12-19 NOTE — H&P (Signed)
NAMEKRISTIEN, [name]               ACCOUNT NO.:  [id number]   MEDICAL RECORD NO.:  [id number]          PATIENT TYPE:  INP   LOCATION:  1432                         FACILITY:  [facility]   PHYSICIAN:  Kela Millin, M.D.DATE OF BIRTH:  June 25, 1930   DATE OF ADMISSION:  05/21/2008  DATE OF DISCHARGE:                              HISTORY & PHYSICAL   PRIMARY CARE PHYSICIAN:  Dr. Merri Brunette.   CHIEF COMPLAINT:  Lower abdominal pain.   HISTORY OF PRESENT ILLNESS:  The patient is a 77 year old white female  with multiple medical problems, including multiple sclerosis, diabetes  mellitus, PSVT (atrial tachycardia with secondary palpitations), bladder  cystocele, optic neuritis, history of back pain and status post surgery  in 2002, and she presents with the above complaints.  She states that  about a week ago she began experiencing some lower abdominal pain, but  it has worsened in the past 2 days.  She describes the pain as dull and  constant for the most part, but sometimes she has a burning type pain in  her bladder area.  The pain she states was 8/10 in intensity at its  worst and she got some relief with the medications in the ER.  She also  reports that she has pain along both sides of her abdomen.  She has had  nausea with intermittent vomiting in the past 2 days.  She had diarrhea  for 2 days - mostly watery, nonbloody and 4-5 times a day.  She states  she has not had any more diarrhea in the past 24 hours.  She admits to  subjective fevers and dysuria.  She denies cough, chest pain, shortness  of breath, melena and no hematochezia.   She was seen in the emergency room and a CT scan of her abdomen and  pelvis done revealed chronic diverticulosis with a small area of  inflammatory change adjacent to the junction of the descending colon and  the sigmoid, consistent with uncomplicated acute diverticulitis,  bilateral high-density renal lesions, likely representing hemorrhagic  cyst  or milk of calcium cyst.  These lesions are stable in size compared  to 2006, suggesting benign etiology.  Also stable splenic and hepatic  cysts are noted.  A urinalysis was done which was positive for the  nitrite, trace leukocyte esterase, 0-2 WBCs and few bacteria noted.  Her  white cell count 5.9 and lactic acid level 1.9.  She is admitted for  further evaluation and management.   PAST MEDICAL HISTORY:  1. As above.  2. History of TIA.  3. Glaucoma.  4. History of chronic sinus problems.  5. Status post cholecystectomy in 1997.  6. Status post cataract surgery in 1983.  7. History of vertigo.  8. History of breast reconstruction in 1980, with rupture of implant.  9. History of intestinal polyp removal.  10.History of chronic headaches.  11.Dyslipidemia.  12.History of chronic diarrhea.   MEDICATIONS:  1. Lisinopril 20 mg daily.  2. Meclizine 25 mg p.r.n.  3. Optive.  4. Zetia 10 mg daily.  5. Calcium.  6. Valium 2 mg  as needed.  7. Flecainide 50 mg b.i.d.  8. Metformin 500 mg b.i.d.  9. Omeprazole 20 mg daily.  10.Nisoldipine 30 mg daily.  11.Zoloft 50 mg daily.  12.Zocor 40 mg nightly.  13.Os-Cal 1 tablet daily.   ALLERGIES:  1. AVONEX.  2. BIAXIN.  3. DECADRON.  4. IVP DYE/IODINE CONTRAST.  5. NEURONTIN.  6. PAMELOR.  7. SULFA.  8. SYNTHROID.   SOCIAL HISTORY:  She is married with four children.  She denies tobacco,  also denies alcohol.   FAMILY HISTORY:  Her mother died with ALS and her father died in his 85s  secondary to metastatic lung cancer.   REVIEW OF SYSTEMS:  As per HPI, other review of systems negative.   PHYSICAL EXAMINATION:  GENERAL:  The patient is a pleasant elderly white  female.  She is alert and appropriate in no respiratory distress.  VITAL SIGNS:  Temperature is 97.7, blood pressure 102/61 with a pulse of  64, respiratory rate of 18.  O2 sat of 97%.  HEENT:  Atraumatic, normocephalic.  Sclerae anicteric.  Slightly dry  mucous  membranes and no oral exudates.  NECK:  Supple, no adenopathy and no thyromegaly.  LUNGS:  Decreased breath sounds at bases, otherwise clear to  auscultation.  CARDIOVASCULAR:  Regular rate and rhythm.  Normal S1-S2.  ABDOMEN:  Soft, bowel sounds present.  Mild lower abdominal tenderness,  no rebound tenderness, nontender and no masses palpable.  EXTREMITIES:  No cyanosis and no edema.  NEURO:  Alert and oriented x3.  Cranial Nerves II-XII grossly intact.  Her strength is 4+/5 in her upper extremities and 4-/5 in her lower  extremities.  Decreased sensation in her lower extremities.   LABORATORY DATA:  CT scan of abdomen and pelvis - as per HPI.  Hemoccult  is negative.  Urinalysis as per HPI.  White cell count is 5.9,  hemoglobin is 14.5, hematocrit of 43.4, platelet count of 220.  Her  sodium is 140 with a potassium of 4.1, chloride 108, BUN is 11,  creatinine is 0.7, glucose is 114, ionized calcium is 1.2, lactic acid  is 1.9.   ASSESSMENT AND PLAN:  1. Acute diverticulitis - descending/sigmoid colon, uncomplicated.      Will place on empiric antibiotics, keep n.p.o. with pain      management.  Follow and obtain stool cultures if any further      diarrhea.  Also follow and consider surgery consult pending      clinical course.  2. Dysuria - urinalysis not very remarkable, obtain urine cultures on      antibiotics for #1, follow and treat accordingly, pending cultures.  3. History of paroxysmal supraventricular tachycardia - EKG with      normal sinus rhythm, continue flecainide.  4. Diabetes mellitus - hold off metformin at this time, monitor Accu-      Cheks and cover with sliding scale insulin.  5. Hypertension - follow and continue outpatient medications.  6. History of multiple sclerosis - continue outpatient medications.      Kela Millin, M.D.  Electronically Signed     ACV/MEDQ  D:  05/22/2008  T:  05/22/2008  Job:  [id number]   cc:   Dario Guardian, M.D.   Fax: [phone]

## 2010-12-19 NOTE — Discharge Summary (Signed)
[name], [name]               ACCOUNT NO.:  [id number]   MEDICAL RECORD NO.:  [id number]          PATIENT TYPE:  INP   LOCATION:  1432                         FACILITY:  [facility]   PHYSICIAN:  Kela Millin, M.D.DATE OF BIRTH:  [birth year]   DATE OF ADMISSION:  05/21/2008  DATE OF DISCHARGE:                               DISCHARGE SUMMARY   DISCHARGE DIAGNOSES:  1. Diverticulitis, acute - descending/sigmoid colon, uncomplicated.  2. Diabetes mellitus.  3. Multiple sclerosis.  4. History of degenerative joint disease/chronic back pain.  5. History of paroxysmal supraventricular tachycardia - in normal      sinus rhythm.  6. Hypertension.  7. History of chronic diarrhea.  8. History of dyslipidemia.  9. History of chronic headaches.  10.History of vertigo  11.History of transient ischemic attack.  12.Glaucoma.  13.Probable Protonix reaction.   PROCEDURES AND STUDIES:  1. CT scan of abdomen and pelvis - mild inflammatory changes      associated with the junction of the descending colon and sigmoid      colon, consistent with uncomplicated acute diverticulitis.  2. Lumbar spine films - scattered degenerative disk disease changes      without acute abnormality.  3. Left hip x-ray - bony demineralization, no acute bony      abnormalities.   BRIEF HISTORY:  The patient is a 77 year old white female with multiple  medical problems as listed above, who presented with complaints of lower  abdominal pain.  She reported that the pain had begun a week prior to  admission, but worsened in the 2 days prior to her coming to the ER.  She stated that she had, had some diarrhea, but in the 24 hours prior to  presenting, she has not had any further diarrhea.  The patient also  indicated that she chronically would have periods where she had  diarrhea/loose stools followed by constipation and then the cycle would  repeat itself.   She was seen in the ER and a CT scan of the abdomen was  done, which was  consistent with diverticulitis.  She was admitted for further evaluation  and management.  She admitted to nausea, also to subjective fevers and  dysuria.   Please see the full dictated admission history and physical for the  details of the admission physical exam, as well as the laboratory data.   HOSPITAL COURSE:  1. Acute diverticulitis, mild descending/sigmoid colon - as discussed      above, upon admission a CT scan was done and the results as stated      above.  She was started on empiric IV antibiotics and kept n.p.o.      She was also started on IV analgesics for pain control.  Stool      studies were ordered and the C diff came back negative x1, and the      stool cultures no growth to date.  The patient's symptoms gradually      improved.  She was switched to p.o. antibiotics which she has been      tolerating well.  She was started  on a clear liquid diet which has      been advanced to solids and she is tolerating well.  She is not      having any further abdominal pain.  She has remained afebrile and      hemodynamically stable.  She will be discharged home at this time      on oral antibiotics.  She is to follow up with gastroenterologist,      Dr. Leone Payor, also with her primary care physician.  2. Low back pain - the patient had pain radiating down her left leg in      the hospital and x-rays were done and the results are as stated      above.  She reported that she has a chronic history of back/leg      pain and was on Vicodin outpatient for this, as well as Valium.      Physical therapy was consulted and saw the patient.  They have      recommended home health PT and also for her to use her walker at      home.  3. Probable reaction to Protonix - while in the hospital the patient      was receiving IV Protonix and she reported a reaction of flushing      and burning when the medication was infused and this was      discontinued.  She was then placed on  Prilosec which she has      tolerated well.  4. Hypertension - the patient was maintained on her outpatient      medications during her hospital stay.  5. Multiple sclerosis - the patient was maintained on her outpatient      medications while in the hospital.  6. Diabetes mellitus - her Accu-Cheks were monitored and she was      covered with sliding scale insulin while in the hospital.  She is      to continue her outpatient medications upon discharge.   DISCHARGE MEDICATIONS:  1. Flagyl 500 mg 1 p.o. t.i.d.  2. Cipro 500 mg 1 p.o. b.i.d.  3. Florastor 250 mg 1 p.o. b.i.d.  4. The patient to continue lisinopril 20 mg daily.  5. Meclizine 25 mg p.r.n. as previously.  6. Zetia 10 mg daily.  7. Calcium 600 mg b.i.d.  8. Valium p.r.n. as previously.  9. Flecainide 50 mg b.i.d.  10.Omeprazole 20 mg daily.  11.Sertraline 50 mg daily.  12.Simvastatin 40 mg daily.  13.Nisoldipine 30 mg daily.  14.Vicodin p.r.n. as previously.   FOLLOW-UP CARE:  1. Dr. Merri Brunette in 1-2 weeks.  2. Dr. Leone Payor in 2-3 weeks, call for appointment.   DISCHARGE CONDITION:  Improved - stable.      Kela Millin, M.D.  Electronically Signed     ACV/MEDQ  D:  05/25/2008  T:  05/25/2008  Job:  [id number]   cc:   Dario Guardian, M.D.  Fax: [phone]   Iva Boop, MD,FACG  [facility]  [address] [place], [place] [address]

## 2010-12-19 NOTE — Discharge Summary (Signed)
[name], [name]               ACCOUNT NO.:  [id number]   MEDICAL RECORD NO.:  [id number]          PATIENT TYPE:  INP   LOCATION:  5014                         FACILITY:  [facility]   PHYSICIAN:  Kela Millin, M.D.DATE OF BIRTH:  01-May-1930   DATE OF ADMISSION:  08/22/2008  DATE OF DISCHARGE:  08/26/2008                               DISCHARGE SUMMARY   DISCHARGE DIAGNOSES:  1. Diverticulitis, proximal transverse colon.  2. Diabetes mellitus.  3. Multiple sclerosis.  4. Irritable bowel syndrome.  5. History of optic neuritis.  6. History of bladder cystocele.  7. History of paroxysmal supraventricular tachycardia (atrial      tachycardia with palpitations).  8. Prior history of diverticulitis in October 2009.  9. History of transient ischemic attack.  10.History of vertigo.  11.History of chronic headaches.  12.History of intestinal polyp removal.  13.History of breast reconstruction in 1980 with rupture of implants.  14.History of chronic sinus problems.  15.History of glaucoma.  16.History of back pain and status post surgery in 2002.  17.History of dyslipidemia.  18.Status post cholecystectomy in 1997.  19.History of cataracts and status post surgery in 1983.   PROCEDURES AND STUDIES:  1. CT scan of abdomen and pelvis:  Changes consistent with      diverticulitis of the proximal transverse colon.  No abscess is      seen.  2. CT scan of pelvis:  Scattered rectosigmoid colonic diverticula are      present.   CONSULTATIONS:  Gastroenterology, Dr. Leone Payor   BRIEF HISTORY:  The patient is a pleasant 77 year old white female with  the above-listed medical problems who presented with complaints of  worsening lower abdominal pain, right greater than left, some chills as  well as nausea and vomiting.  She also reported intermittent  diarrhea/constipation.  She described the abdominal pain as cramping in  nature, also sharp/stabbing.  A urinalysis was done which was  negative  for a UTI, and a CT scan of her abdomen was done in the ER with results  as stated above.  She was admitted for further evaluation and  management.   Please see the full admission history and physical dictated on August 22, 2008, by Dr. Ramiro Harvest for the details of the admission  physical exam as well as the laboratory data.   HOSPITAL COURSE:  1. Sigmoid diverticulitis:  On admission, she was started on IV Cipro      and Flagyl.  Gastroenterology was consulted, and Dr. Leone Payor saw      the patient.  Her symptoms gradually improved, and she was started      on clear liquid diet per GI; it has been advanced as tolerated.      She is at this time tolerating solids well with no nausea and      vomiting.  She was noted to have a leukocytosis on admission of      12.1, and this has resolved with the above management.  Her last      white cell count prior to discharge was 4.8.  Dr. Leone Payor saw the  patient on rounds and has recommended that she be discharged home      on oral Cipro and Flagyl for a total of 2 weeks.  He also      recommended a low-residue diet on discharge and Vicodin as needed.      She is to follow up with him as scheduled early in February 2010.  2. Irritable bowel syndrome:  Dr. Leone Payor recommended MiraLax as      needed for constipation.  3. Diabetes mellitus:  She is to continue her outpatient medications      on discharge.   The patient's other chronic medical problems remained stable during this  hospital stay, and she is to continue her outpatient medications on  discharge.   DISCHARGE MEDICATIONS:  1. Cipro 500 mg 1 p.o. b.i.d. for 10 more days.  2. Flagyl 500 mg 1 p.o. q.8 h. for 10 more days.  3. Vicodin 5 mg 1 p.o. q.6 h. p.r.n.  4. MiraLax p.r.n. as directed.   The patient is to continue preadmission medications as follows:  1. Nisoldipine 30 mg daily.  2. Lisinopril 20 mg p.o. daily.  3. Meclizine 25 mg p.r.n.  4. Optivar  ophthalmic.  5. Zetia 10 mg p.o. daily.  6. Calcium daily.  7. Valium 2 mg daily p.r.n.  8. Flecainide 50 mg p.o. b.i.d.  9. Metformin 500 mg p.o. b.i.d.  10.Omeprazole 20 mg p.o. daily.  11.Zoloft 50 mg p.o. daily.  12.Zocor 40 mg p.o. at bedtime.  13.Align 1 p.o. daily.  14.Dicyclomine  20 mg as previously.  15.Pentosan 100 mg p.o. daily.   FOLLOWUP CARE:  1. Dr. Merri Brunette in 2-3 weeks.  Call for appointment.  2. Dr. Leone Payor as scheduled in early February 2010.   DISCHARGE CONDITION:  Improved/stable.      Kela Millin, M.D.  Electronically Signed     ACV/MEDQ  D:  08/26/2008  T:  08/26/2008  Job:  [id number]   cc:   Dario Guardian, M.D.  Iva Boop, MD,FACG

## 2010-12-19 NOTE — Assessment & Plan Note (Signed)
[facility]                         ELECTROPHYSIOLOGY OFFICE NOTE   NAME:[name], [name]                      MRN:          [id number]  DATE:05/05/2008                            DOB:          12-09-1929    [name] returns today for followup.  She is a very pleasant elderly  woman with a history of palpitations and atrial tachycardia who we have  tried on flecainide.  When I saw her back in May, she was having trouble  with diarrhea and I actually asked that she stop her flecainide for  several weeks, but she noted that her diarrhea persisted despite being  on flecainide and so she was started back as per request.  She returns  today for followup.  Her palpitations have been very well controlled.  She denies other specific complaints today.   PHYSICAL EXAMINATION:  GENERAL:  She is a pleasant, well-appearing woman  in no distress.  VITAL SIGNS:  Blood pressure was 132/70, the pulse 80 and regular, the  respirations were 18, and the weight was 160 pounds.  NECK:  No jugular distention.  LUNGS:  Clear bilaterally to auscultation.  No wheezes, rales, or  rhonchi are present.  CARDIOVASCULAR:  Regular rate and rhythm.  Normal S1 and S2.  ABDOMINAL:  Soft, nontender.  EXTREMITIES:  Demonstrated no edema.   EKG demonstrates sinus rhythm with normal axis and intervals.   IMPRESSION:  1. Paroxysmal supraventricular tachycardia (atrial tachycardia with      palpitations secondary to this).  2. Diabetes.  3. Hypertension.  4. Chronic diarrhea.   DISCUSSION:  [name] is stable and her diarrhea is not related to  her flecainide.  I have asked that she continue flecainide 50 twice a  day, and if she has breakthrough palpitations increase to 50 three times  a day as needed.  We will plan to see the patient back for followup in  several months.     Doylene Canning. Ladona Ridgel, MD  Electronically Signed    GWT/MedQ  DD: 05/05/2008  DT: 05/06/2008  Job #:  [id number]   cc:   Armanda Magic, M.D.

## 2010-12-19 NOTE — H&P (Signed)
[name], [name] NO.:  [id number]   MEDICAL RECORD NO.:  [id number]          PATIENT TYPE:  INP   LOCATION:  1824                         FACILITY:  [facility]   PHYSICIAN:  Ramiro Harvest, MD    DATE OF BIRTH:  06-28-1930   DATE OF ADMISSION:  08/22/2008  DATE OF DISCHARGE:                              HISTORY & PHYSICAL   The patient's primary care physician is Dr. Merri Brunette.  The patient's gastroenterologist is Dr. Leone Payor of [facility].   HISTORY OF PRESENT ILLNESS:  [name] is a 77 year old white female  with a history of diverticulitis, last episode October 2009, history of  multiple sclerosis, diabetes, PSVT, history of optic neuritis, status  post cholecystectomy who presents to the ED with a 2- to 3-day history  of worsening bilateral lower abdominal pain, right greater than left,  some chills, some nausea and chronic diarrhea/constipation.  The patient  described abdominal pain as cramping in nature, sharp and stabbing as  well.  Denies any fevers. No melena or hematochezia.  No hematemesis, no  chest pain or shortness of breath, no headache, no visual changes, no  focal neurological symptoms.  No other associated symptoms.  The patient  was seen in the ED.  Comprehensive metabolic profile obtained was within  normal limits.  CBC with a white count of 12.1, otherwise within normal  limits.  UA was negative for UTI. Abdominal film was within normal  limits.  CT of the abdomen and pelvis with changes consistent with  diverticulitis of the proximal transverse colon. No abscess seen.  The  patient was given a dose of IV Cipro and IV Flagyl in the ED. We were  called to admit for further evaluation and management.   ALLERGIES:  The patient is allergic to AVONEX, BIAXIN, DECADRON, IV DYE,  IODINE CONTRAST, NEURONTIN, PAMELOR, SULFA, SYNTHROID, METHYLPREDNISONE,  METOPROLOL.   PAST MEDICAL HISTORY:  1. Multiple sclerosis.  2.  Type 2 diabetes.  3. PSVT with (atrial tachycardia with secondary palpitations).  4. Bladder cystocele.  5. Optic neuritis.  6. History of back pain status post surgery in 2002.  7. Prior history of diverticulitis May 21, 2008.  8. Next history of TIA.  9. Glaucoma.  10.History of chronic sinus problems.  11.Status post cholecystectomy 1997.  12.Status post cataract surgery 1983.  13.History of vertigo.  14.History of breast reconstruction 1980 with rupture of implant.  15.History of intestinal polyp removal.  16.History of chronic headaches.  17.Dyslipidemia.  18.History of chronic diarrhea/constipation.   PAST SURGICAL HISTORY:  1. A right-sided mastectomy.  2. Status post hysterectomy.  3. Status post tonsillectomy.  4. Operations to broken fingers.  5. Rectocele.  6. Cystocele.  7. Hemorrhoidal surgery.  8. Tubal ligation before hysterectomy.  9. Cyst removal from the throat.  10.Deviated septum surgery.  11.Exploratory surgery on the stomach.  12.Trigger finger surgery x2.  13.Carpal tunnel surgery.  14.Fasciitis of the heel status post surgery.  15.Implants replaced and tumor removal on the right breast.  16.Status post cataract removal by both eyes.  17.Status post retinal  detachment x3.  18.Stitch removal from the white of the eye.  19.Lesion removal from the left eye.  20.Status post back surgery.   HOME MEDICATIONS:  1. Lisinopril 20 mg p.o. daily.  2. Sertraline 50 mg p.o. daily.  3. Lyrica 75 mg p.o. daily.  4. Elmiron 100 mg p.o. daily.  5. Os-Cal 1 tablet p.o. daily.  6. Metformin 500 mg b.i.d.  7. Align daily.  8. Meclizine 25 mg p.r.n.  9. Zetia 10 mg daily.  10.Simvastatin 40 mg p.o. daily.  11.Calcium 600 mg p.o. b.i.d.  12.Valium 2 mg p.r.n.  13.Flecainide 50 mg p.o. b.i.d.  14.Omeprazole 20 mg p.o. daily.  15.Nisoldipine 30 mg p.o. daily.   SOCIAL HISTORY:  The patient is married, no tobacco use.  No alcohol  use.  No IV drug use.    FAMILY HISTORY:  Mother deceased at age 7 from ALS. Father deceased at  age 14 from metastatic lung cancer.   REVIEW OF SYSTEMS:  As per HPI, otherwise negative.   PHYSICAL EXAM:  VITAL SIGNS:  Temperature 101.3, blood pressure 103/51  down to 96/53, heart rate of 109, respiratory rate 22, satting 94%.  GENERAL:  Patient is in no apparent distress, lying on gurney.  HEENT: Normocephalic, atraumatic.  Extraocular movements intact.  Oropharynx is clear.  No lesions, no exudates.  NECK: Supple.  No lymphadenopathy.  RESPIRATORY:  Clear to auscultation bilaterally.  No wheezes, no  crackles.  No rhonchi.  CARDIOVASCULAR: Regular rate and rhythm.  No murmurs, rubs or gallops.  ABDOMEN: Soft, tender to palpation in the right lower quadrant greater  than left lower quadrant. Positive bowel sounds.  Mild distention.  EXTREMITIES:  No clubbing, cyanosis or edema.  NEUROLOGICAL: The patient alert and oriented x3.  Cranial nerves II-XII  are grossly intact.  No focal deficits.   LABS:  Sodium 140, potassium 4.3, chloride 107, bicarb 25, BUN 12,  creatinine 0.81.  Glucose of 133, calcium of 9, bilirubin 0.7, alk  phosphatase 42, AST 16, ALT 27, albumin 3.6, protein 6.1.  CBC white  count 12.1, hemoglobin 13, platelets 205, hematocrit 40.1, ANC of 10.  Abdominal films nonspecific bowel gas pattern.  CT of the abdomen and  pelvis have changes consistent with diverticulitis of the proximal  transverse colon. No abscess seen.  No acute abnormality of CT of the  pelvis. Scattered rectosigmoid colonic diverticula are present.  EKG  with a normal sinus rhythm.  Urinalysis showed urine was green, cloudy,  specific gravity 1.008, pH of 6, glucose negative, bilirubin negative,  ketones negative, blood negative, protein negative, urobilinogen 0.2,  nitrite negative, leukocytes small.   ASSESSMENT AND PLAN:  [name] is a 77 year old female with a prior  history of diverticulitis, multiple sclerosis,  hypertension,  hyperlipidemia, multiple medical problems who presented to the ED with  bilateral lower quadrant abdominal pain, right greater than left found  per CT to have diverticulitis.  1. Acute diverticulitis.  This is the patient's second episode. Will      make the patient NPO, check blood cultures x2, check lipase, check      amylase.  Will place on empiric antibiotics of IV Cipro and Flagyl.      Consult with GI in the morning for further evaluation and      recommendations.  The patient may need a surgical consult for      possible resection as this is her second episode of diverticulitis.  2. Leukocytosis likely secondary to  acute diverticulitis. A UA was      negative for UTI. Will check a chest x-ray. Check blood cultures      x2.  Place on empiric antibiotics of IV Cipro and Flagyl.  3. History of paroxysmal supraventricular tachycardia.  Flecainide.  4. Diabetes.  Check a hemoglobin A1c. Sliding scale insulin.  5. Hypertension.  Hold BP meds for now.  6. History of multiple sclerosis.  7. Prophylaxis. Protonix for GI prophylaxis.  SCDs for DVT      prophylaxis.   It has been a pleasure taking care of Ms. [name].      Ramiro Harvest, MD  Electronically Signed     DT/MEDQ  D:  08/22/2008  T:  08/22/2008  Job:  [id number]   cc:   Dario Guardian, M.D.  Iva Boop, MD,FACG

## 2010-12-19 NOTE — Letter (Signed)
November 06, 2007    Armanda Magic, M.D.  [address], [address]  [place], [place] [address]   RE:  [name], [name]  MRN:  [id number]  /  DOB:  07-12-1930   Dear [name],   Thank you referring Ms. [name] for EP evaluation for documented  SVT.  As you know, the patient is a very pleasant 77 year old woman who  has a history of tachy palpitations in the past.  She also has a history  of shortness of breath occurring when her heart races, as well as  diarrhea, abdominal pain, and some abdominal discomfort while taking  calcium channel blockers in the past.  The patient also has a history of  intolerance to beta blockers.  The patient denies any frank syncopal  episodes.  She notes that her heart races at times, typically lasting a  few seconds to as long as a few minutes, but never much longer.  She is  referred for additional evaluation.  These episodes occur several times  a week, as noted previously do not last for prolonged period time,  sometimes lasting only seconds.   ADDITIONAL PAST MEDICAL HISTORY:  1. Hypertension.  2. Dyslipidemia.  3. History of multiple sclerosis.  4. Depression.  5. Costochondritis.  6. History of hemorrhoidal surgery.  7. History of tubal ligation.  8. History of vertigo.  9. History of chronic headaches.   FAMILY HISTORY:  Noncontributory at her advanced age.   SOCIAL HISTORY:  Notable that she is married.  She denies tobacco or  ethanol abuse.   REVIEW OF SYSTEMS:  Notable for the patient wearing glasses for visual  acuity.  She has a history of sinus problems, and a history of arthritic  complaints, and nocturia.  Otherwise, all systems were negative, except  as noted in the HPI.   PHYSICAL EXAMINATION:  GENERAL:  She is a pleasant well-appearing 42-  year-old woman in no acute distress.  VITAL SIGNS:  The blood pressure was 132/64, the pulse was 90 and  regular, the respirations were 18, the weight was 162 pounds.  HEENT:   Normocephalic and atraumatic.  Pupils equal and round.  The  oropharynx moist.  The sclerae were anicteric.  NECK:  Revealed no jugular venous distention.  There is no thyromegaly.  Trachea is midline.  Carotids are 2+ and symmetric.  LUNGS:  Clear bilaterally to auscultation.  No wheezes, rales, or  rhonchi are present.  No increased work of breathing.  CARDIOVASCULAR:  Revealed a regular rate and rhythm, with normal S1 and  S2.  The PMI was not enlarged, nor was it laterally displaced.  ABDOMINAL:  Soft, nontender, nondistended.  There was no organomegaly.  The bowel sounds were present.  No rebound or guarding.  EXTREMITIES:  Demonstrated no cyanosis, clubbing, or edema.  NEUROLOGIC:  Alert and oriented x3, with cranial nerves intact.  Strength was 5/5 and symmetric.   The EKG demonstrates sinus rhythm, with incomplete right bundle branch  block, at a rate of 90 beats per minute.  Review of the patient's  CardioNet monitor demonstrates periods of nonsustained atrial  tachycardia at rates of up to 160 beats per minute.  There are no  significant pauses noted.  She did have occasional sinus rhythm with  ventricular bigeminy as well.   IMPRESSION:  1. Symptomatic palpitations.  2. Documented nonsustained atrial tachycardia.  3. Documented nonsustained ventricular bigeminy.  4. Intolerance to both calcium channel blockers as well as beta  blockers.   DISCUSSION:  I basically discussed several possible treatment options  with regard to [name].  The first option, which I have not  recommended, would be to proceed with electrophysiologic study and  catheter ablation of atrial tachycardia.  At this point, I do not think  she has enough sustained SVT to make this worthwhile and that the  likelihood of freeing her up of all symptoms with catheter ablation  would be not particularly promising.  Another option would be trying  flecainide therapy to suppress her atrial arrhythmias  as well as  ventricular arrhythmias.  While there is a small risk of proarrhythmia  on flecainide, I think that this point this is the best drug diffuse  should drug therapy be required.  Finally, we talked about the prospect  of continuing of a period of watchful waiting after discussing the  benign nature of the patient's symptoms.  At least in my office today,  she has played down the symptoms and not complained of then particularly  severely, particularly once that she knows they are not dangers or life-  threatening to her.  Obviously, if her symptoms changed and became more  severe, then I would feel much more strongly about originating  antiarrhythmic drug therapy or considering catheter ablation.   Traci, thanks again for referring [name] for EP evaluation.  In  summary, the patient has not impressed me by the severity of her  symptoms.  At least she is downplaying them day, and for this reason she  prefers not to proceed with either catheter ablation or initiation of  antiarrhythmic drug therapy, though I think if her symptoms worsen, then  a trial of low-dose flecainide would be in order.    Sincerely,      Doylene Canning. Ladona Ridgel, MD  Electronically Signed    GWT/MedQ  DD: 11/06/2007  DT: 11/07/2007  Job #: [id number]

## 2010-12-22 NOTE — H&P (Signed)
[place]  Patient:    [name], [name]                      MRN: [id number] Adm. Date:  [id number] Attending:  Tressie Stalker D                         History and Physical  CHIEF COMPLAINT:  Back pain, bilateral leg pain.  HISTORY OF PRESENT ILLNESS:  The patient is a 77 year old white female who has had trouble with her back in the past.  She has had back pain for years.  She failed medical management, and she has been worked up with a lumbar MRI by her rheumatologist.  It demonstrated a spondylolisthesis with spinal stenosis. The patient was kindly sent for my consultation.  The patient complains primarily of back pain at the lumbosacral junction, which radiates into her bilateral lower extremities if she stands or walks too much.  It limits her activities of daily living.  Her right leg becomes completely numb if she walks.  Her left leg has been also chronically numb since 1996.  Her back and legs hurt her equally.  She has been treated with physical therapy, epidural steroid injections, etc., and has failed medical management.  PAST MEDICAL HISTORY:  Positive for multiple sclerosis, transient ischemic attacks, bladder cystocele, hypertension, glaucoma, depression, optic neuritis, tremor, chronic sinus problems, osteoarthritis, cataracts, benign breast cyst, fibromyalgia.  PAST SURGICAL HISTORY:  Mastectomy in 1975 for a benign tumor.  Breast reconstruction in 1980.  Hysterectomy in 1967.  Cholecystectomy in 1997. Cataract surgery in 1983.  She had a rupture of her breast implant and subsequent removal in May 2001, bladder surgery July 2001, eye surgery in 1999, removal of intestinal polyps in 1998, surgery on a broken finger in 1978, _____ in 1970.  MEDICATIONS:  Sular 20 mg p.o. q.d., Altace 5 mg p.o. q.d., Estrace 1 mg p.o. q.d., Detrol 2 mg p.o. b.i.d., Neurontin 300 mg p.o. history, Antivert 25 mg p.r.n., Timolol eye drops 0.5 mg  b.i.d., Patanol 0.1 mg eye drops p.r.n., Mirapex 0.125 mg p.o. q.d., Flonase p.r.n., Allegra D p.r.n., Darvocet-N 100 p.r.n., Zoloft 50 mg p.o. q.d.  DRUG ALLERGIES:  BIAXIN causes dizziness.  IVP DYE causes rash and syncope.  FAMILY HISTORY:  The patients mother died at age 79 of amyotrophic lateral sclerosis.  The patients father died in his 72s secondary to metastatic lung cancer.  SOCIAL HISTORY:  The patient is married.  She has four children.  She lives in [place].  She is not employed.  She denies tobacco, ethanol, or drug use.  REVIEW OF SYSTEMS:  Negative except as above.  PHYSICAL EXAMINATION:  VITAL SIGNS:  Height 5 feet 3 inches, weight 134 pounds.  GENERAL:  A pleasant, mildly obese 77 year old white female complaining of back pain.  HEENT:  Normocephalic, atraumatic.  Pupils:  OS, irregular; OD status post iridectomy.  Oropharynx demonstrates poor dentition.  She was wearing dentures.  Uvula midline.  NECK:  Supple with no deformities, point tenderness, tracheal deviation, jugular venous distention, or carotid bruits.  She does have a limited cervical range of motion.  Spinal extension is negative bilaterally, causing neck pain only.  Lhermittes sign was not present.  CHEST:  Thorax is symmetric.  Lungs are clear to auscultation, without rales, rhonchi, or wheezes.  CARDIAC:  Regular rate and rhythm.  ABDOMEN:  Obese, soft, nontender, no guarding or  rebound.  EXTREMITIES:  The patient is diffusely tender to palpation.  She does have some arthritic-type changes.  BACK:  There is no point tenderness.  She is diffusely tender at the lumbosacral junction.  Fabers testing is negative bilaterally.  Straight leg raise testing is positive bilaterally.  NEUROLOGIC:   The patient is alert and oriented x 3.  Cranial nerves II-XII are grossly intact bilaterally.  Vision is grossly normal bilaterally with corrective lenses.  Hearing is grossly normal bilaterally.   Motor strength is 5/5 in her bilateral deltoid, biceps, triceps, hand grip, wrist extensor, interosseous, psoas, quadriceps, gastrocnemius, extensor hallucis longus, although she does give away diffusely occasionally.  Deep tendon reflexes are 2/4 in her bilateral biceps, triceps, brachioradialis, quadriceps, and right gastrocnemius, 1/4 in the left gastrocnemius.  She has mild flexor plantar reflexes.  No ankle clonus.  Sensory exam demonstrates some diffuse light touch sensation in her feet bilaterally.  DIAGNOSTIC STUDIES:  The patient had a lumbar MRI performed at [facility] on February 20, 2000, showing mild grade 1 spondylolisthesis at L4-5.  She has spinal stenosis at this level, multifactorial spinal stenosis at this level.  The other levels looked pretty good.  ASSESSMENT AND PLAN:  L4-5 grade 1 spondylolisthesis, spinal stenosis, degenerative disk disease, lumbago, lumbar radiculopathy.  I discussed the situation with the patient.  I reviewed her MRI study, performing out the abnormalities.  I have discussed the various treatment options with her, including continued medical management, doing nothing, steroid injections, etc.  The patient has failed nonsurgical management, and therefore after weighing the risks, benefits, and alternatives of surgery elected to proceed with an L4-5 decompression and fusion.  I described the procedure of the L4-5 posterior lumbar interbody fusion with posterior pedicle screws, rods, and a posterolateral arthrodesis.  I have described the surgery to her, shown her surgical models, discussed its risks of surgery extensively.  The patient has weighed the risks, benefits, and alternatives of surgery and wants to proceed with the operation on January 16, 2001.  History of multiple medical problems noted. DD:  01/16/01 TD:  01/17/01 Job: [id number]

## 2010-12-22 NOTE — Op Note (Signed)
[place]. [facility]  Patient:    [name], [name]                      MRN: [id number] Proc. Date: 01/16/01 Adm. Date:  [id number] Attending:  Tressie Stalker D                           Operative Report  BRIEF HISTORY:  The patient is a 77 year old white female who has suffered from back and bilateral leg pain.  She has failed extensive nonsurgical management including time, medications, physical therapy, steroid injections, etc.  She was worked up with a lumbar MRI, which demonstrated a grade 1 spondylolisthesis at L4-5 with spinal stenosis.  I discussed the various treatment options with her including doing nothing, continuing medical management, and surgery.  The patient weighed the risks, benefits, and alternatives of surgery and desired to proceed with the operation after she failed medical management.  PREOPERATIVE DIAGNOSIS:  L4-5 grade 1 acquired spondylolisthesis, spinal stenosis, degenerative disk disease, lumbago, and lumbar radiculopathy.  POSTOPERATIVE DIAGNOSIS:  L4-5 grade 1 acquired spondylolisthesis, spinal stenosis, degenerative disk disease, lumbago, and lumbar radiculopathy.  PROCEDURE:  Bilateral L4 laminotomy and foraminotomy, posterior lumbar interbody fusion, L4-5, insertion of tangent cortical bone dowels, posterior nonsegmental instrumentation, L4-5, with insertion of GD Horizontal ______ pedicle screws and rods, and posterolateral arthrodesis with local morcellized autograft bone and VITOS bone substitute.  SURGEON:  Cristi Loron, M.D.  ASSISTANT:  Tanya Nones. Jeral Fruit, M.D.  ANESTHESIA:  General endotracheal.  ESTIMATED BLOOD LOSS: 350 cc.  SPECIMENS:  None.  DRAINS: None.  COMPLICATIONS:  None.  DESCRIPTION OF PROCEDURE:  The patient was brought to the operating room by an anesthesia team.  General endotracheal anesthesia was induced.  The patient was then turned to the prone position on the chest roll.  The  lumbosacral region was then prepared with Bunnell scrub and Betadine solution.  Sterile drapes were applied.  I then injected the ______ with Marcaine with epinephrine solution using a scalpel to make a vertical midline incision over the L4 interspace.  I used electrocautery to dissect down to the thoracolumbar fascia and divide the fascia bilaterally and performing a bilateral subperiosteal dissection, retracting the paraspinous musculature from the spinous process of the lamina of L4 and L5, exposing the bilateral L4-5 facet joints.  I inserted the McConnell retractor for exposure and then obtained an intraoperative radiograph to confirm our location.  I then used a high-speed drill to perform bilateral L4 laminotomy.  I widened the laminotomy with the Kerrison punch, removing the medial aspect of the L4-5 facet, as well as the L4-5 ligamentum flavum.  I performed a foraminotomy about the bilateral L5 nerve roots and decompressed the thecal sac.  I then freed up the nerve root and thecal sac from the epineural tissue and then gently retracted medially with the ______ retractor and performed an aggressive bilateral L4-5 diskectomy, scraping the vertebral end plates with the various curettes.  I then carefully retracted the thecal sac from the right L4 nerve root medially and then sequentially dilated the L4-5 interspace with the distractors and then decided on a 10 mm distraction.  I then prepared the ______ with appropriate cutters and curettes and then carefully inserted a 10 mm tangent bone wedge on the right side.  I then filled just medial to this bone wedge with ______ bone substitute, and then I repeated this procedure on  the left side, placing a 10 mm tangent bone wedge on that side.  Having completed the posterior lumbar interbody fusion, I now turned my attention to the posterior ______ instrumentation.  I exposed the bilateral L4 and L5 transverse process with electrocautery.   Using fluoroscopic guidance, I decorticated posterior to the bilateral L4 and L5 pedicles.  I then cannulated pedicles with pedicle probe and cap and then inserted a 6.5 x 45 mm GD Horizon titanium pedicle screws under fluoroscopic guidance.  I then connected the unilateral pedicles with an appropriate little bulldozed rod and then secured the pedicle screws to the rods using the appropriate caps and the torque wrench.  Having completed the instrumentation, I then turned my attention to the posterolateral arthrodesis.  I used the high-speed drill to decorticate the remainder of the L4-5 facet joint and the L4 pars region, the transverse processes, and the L5 lamina, etc.  It then laid the combination of localized morcellized autograft bone and ______ bone substitute over these decorticate surfaces to complete the posterolateral arthrodesis.  I then inspected the thecal sac and L5 nerve roots and noted that the ______ were well-decompressed throughout the epineural foramen.  I achieved stringent hemostasis with bipolar electrocautery.  I then removed the ______ and then reapproximated the patients thoracolumbar fascia with interrupted #1 Vicryl suture, the subcutaneous tissue with 2-0 Vicryl sutures and the skin with Steri-Strips and Benzoin.  The wound was then covered with Bacitracin ointment, and a sterile dressing was applied.  The drapes were removed.  The patient was returned to the supine position where she was extubated by the anesthesia team and transported to the postanesthesia care unit in a stable condition.  All sponge, instrument, and needle counts were correct at the end of the case. DD:  01/16/01 TD:  01/17/01 Job: [id number]

## 2010-12-22 NOTE — Op Note (Signed)
[name], [name]               ACCOUNT NO.:  [id number]   MEDICAL RECORD NO.:  [id number]          PATIENT TYPE:  AMB   LOCATION:  DSC                          FACILITY:  [facility]   PHYSICIAN:  Feliberto Gottron. Turner Salmela, M.D.   DATE OF BIRTH:  Mar 05, 1930   DATE OF PROCEDURE:  10/02/2004  DATE OF DISCHARGE:                                 OPERATIVE REPORT   PREOPERATIVE DIAGNOSIS:  Right small finger trigger.   POSTOPERATIVE DIAGNOSIS:  Right small finger trigger.   PROCEDURE:  Right small finger trigger release.   SURGEON:  Feliberto Gottron. Turner Alcantar, M.D.   FIRST ASSISTANT:  Erskine Squibb B. Su Hilt, PA-C.   ANESTHETIC:  IV regional.   ESTIMATED BLOOD LOSS:  Minimal.   FLUID REPLACEMENT:  500 mL of crystalloid.   DRAINS PLACED:  None.   TOURNIQUET TIME:  None.   INDICATIONS FOR PROCEDURE:  A 77 year old woman who has undergone prior  trigger finger releases on her right hand in the past years and now has a  symptomatic right small finger trigger that is hanging up, causing  significant pain, and she desires spur to be released, well aware of the  risks and benefits of surgical intervention.   DESCRIPTION OF PROCEDURE:  The patient identified by armband, taken to the  operating room, at [facility].  Appropriate anesthetic monitors  were attached and right forearm IV regional anesthesia induced.  Right hand  prepped and draped in the usual sterile fashion from the fingertips to the  tourniquet, and we began the procedure by making a transverse 1.5 cm  incision just distal to the palmar crease right over the small finger A1  pulley region.  Small bleeders in the skin and subcutaneous tissue were  identified and cauterized with the bipolar, and we then spread down towards  the A1 pulley, being careful to avoid the medial and lateral neurovascular  bundles.  Once we identified the pulley, retraction was held with a couple  of small pole retractors, and the rectangular section of the A1 pulley  was  removed, allowing the finger tendons to be moved back and forth with no  further triggering.  We then visualized up and down the tendon sheath and  found no further significant impediment.  The wound was washed out with normal saline solution.  The skin only closed  with running 5-0 nylon suture.  A dressing of Xeroform, 4 x 4 dressing  sponges.  A 2-inch Webril and an Ace wrap was then applied.  The patient was  undraped, tourniquet let down, and she was taken to the recovery room  without difficulty      FJR/MEDQ  D:  10/02/2004  T:  10/02/2004  Job:  [id number]

## 2010-12-22 NOTE — Op Note (Signed)
NAME:  [name], [name]                         ACCOUNT NO.:  [id number]   MEDICAL RECORD NO.:  [id number]                   PATIENT TYPE:  AMB   LOCATION:  DSC                                  FACILITY:  [facility]   PHYSICIAN:  Feliberto Gottron. Turner Gressman, M.D.                DATE OF BIRTH:  11/03/1929   DATE OF PROCEDURE:  12/29/2003  DATE OF DISCHARGE:                                 OPERATIVE REPORT   PREOPERATIVE DIAGNOSIS:  Right carpal tunnel syndrome.   POSTOPERATIVE DIAGNOSIS:  Right carpal tunnel syndrome.   PROCEDURE:  Right carpal tunnel release.   SURGEON:  Feliberto Gottron. Turner Bilodeau, M.D.   FIRST ASSISTANT:  None.   ANESTHESIA:  General LMA.   ESTIMATED BLOOD LOSS:  Minimal.   FLUIDS REPLACED:  500 mL crystalloid.   DRAINS:  None.   TOURNIQUET TIME:  12 minutes.   INDICATIONS FOR PROCEDURE:  77 year old woman with severe right carpal  tunnel syndrome documented by EMGs who desires elective right carpal tunnel  release.  The risks and benefits of the surgery are understood by the  patient.  She has numbness, tingling, and pain waking her up seven out of  seven nights per week and is developing some muscle weakness in the thenar  region.   DESCRIPTION OF PROCEDURE:  The patient was identified by her arm band and  taken to the operating room at [facility].  Appropriate  anesthetic monitors were attached and general LMA anesthesia was induced  with the patient in the supine position.  The right upper extremity was then  prepped and draped in the usual sterile fashion from the fingertips to the  mid forearm tourniquet and the limb wrapped with an Esmarch bandage.  The  tourniquet was inflated to 250 mmHg.  We began the procedure by making a  palmar longitudinal incision starting at the wrist flexion crease and going  distally for 3-4 cm just to the ulnar side of the thenar crease.  Small  bleeders from the skin and subcutaneous tissue were identified and  cauterized.  A small  incision was then made in the palmar fascia distally  allowing passage of a Therapist, nutritional into the carpal tunnel which was kept  in a palmar region.  We then cut down on the [facility] elevator sectioning the  transverse carpal ligament and releasing the carpal tunnel.  The incision in  the carpal ligament was then carried up into the forearm fascia with the  black handled scissors.  We then thoroughly probed the carpal tunnel making  sure that the tendons and median nerve were decompressed and visually  verified that none of the structures had been damaged.  At this point, the  wound is  irrigated out with normal saline solution and the skin only closed with  running 5-0 nylon suture and a dressing of Xeroform, 4 by 4 dressing  sponges, Webril, and  an Ace wrap was applied.  The patient was then awakened  and taken to the recovery room without difficulty.                                               Feliberto Gottron. Turner Vanlue, M.D.    Ovid Curd  D:  12/29/2003  T:  12/29/2003  Job:  [id number]

## 2010-12-22 NOTE — H&P (Signed)
[facility]  Patient:    [name], [name]                      MRN: [id number] Adm. Date:  [id number] Disc. Date: [id number] Attending:  Michaele Offer                         History and Physical  CHIEF COMPLAINT:  Cystocele and vulvar irritation.  HISTORY OF PRESENT ILLNESS:  This is a 77 year old white female, gravida 5, para 4, 0-1-4, whom I last saw in June of this year.  The patient complains of increasing pelvic pressure and external irritation, both from a known cystocele and a new external lesion.  She has had two previous anterior colporrhaphies.   On physical examination she has a grade 2 cystocele, no rectocele and a well-supported vaginal cuff.  Externally, she has some irritation posteriorly and a small cyst at approximately 8 oclock.  Due to her symptoms, she is admitted for surgical repair of this.  PAST OB HISTORY:  Significant for four vaginal delivers at term and one miscarriage.  PAST MEDICAL HISTORY:  Significant for gastroesophageal reflux disease, depression, insomnia, muscle spasms, hypertension, detrussor instability and fibromyalgia.  She has a recent diagnosis of multiple sclerosis.  PAST SURGICAL HISTORY:  Significant for a right mastectomy for a benign lesion, history of vaginal hysterectomy with bilateral salpingo-oophorectomy and at least anterior repair x 2, possibly with posterior repair as well.  She has had a D&C for the miscarriage.  She had an appendectomy, cholecystectomy, tonsillectomy and hemorrhoidectomy.  CURRENT MEDICATIONS:  Prevacid, Zoloft, Xanax, Flexeril, Estrace 1 mg q.d., Detrol 2 mg b.i.d., Altace, Sular and Neurontin.  ALLERGIES:  IV CONTRAST and BIAXIN.  SOCIAL HISTORY:  The patient is married and she denies alcohol, tobacco or drug use.  REVIEW OF SYSTEMS:  Otherwise negative.  FAMILY HISTORY:  Significant for no GYN or colon cancer.  PHYSICAL EXAMINATION:  Her weight is approximately  170 pounds. Last blood pressure in the office was 148/100.  GENERAL:  She is a well-developed, well-nourished white female who is in no acute distress.  HEENT:  Pupils are equal, round and reactive to light in accommodation. Extraocular movements are intact.  Oropharynx is clear without erythema or exudate.  NECK:  Supple without lymphadenopathy or thyromegaly.  LUNGS:  Clear to auscultation.  HEART:  Regular rate and rhythm without murmurs.  BREAST EXAM:  In the sitting and supine position reveal no dominant masses, adenopathy, skin change or nipple discharge and she does have an implant on the right side.  ABDOMEN:  Soft, nontender, nondistended with a well-healed midline scar.  EXTREMITIES:  No edema, are nontender.  DTRs are 2/4 and symmetric.  PELVIC EXAM:  External genitalia reveals irritation posteriorly with a small sebaceous cyst at approximately 8 oclock.  On speculum exam there are no lesions.  She has a grade 2-3 cystocele.  No rectocele and good vaginal vault support.  On bimanual and rectovaginal exam there are no palpable masses.  ASSESSMENT:  Symptomatic cystocele as well as vulvar irritation with a small cyst.  She has been on triamcinolone cream for her vulvar irritation in anticipation of surgery.  The risks of the procedure have been discussed with the patient including bleeding, infection, damage to surrounding organs and the risk of failure, given the fact that she has had two previous anterior repairs.  PLAN:  The plan is to admit the  patient for anterior colporrhaphy, removal of a vulvar cyst and possible vulvar biopsy. DD:  02/29/00 TD:  03/01/00 Job: [id number]

## 2010-12-22 NOTE — Discharge Summary (Signed)
[facility]  Patient:    [name], [name]                      MRN: [id number] Adm. Date:  [id number] Disc. Date: [id number] Attending:  Michaele Offer                           Discharge Summary  ADMISSION DIAGNOSES:  Symptomatic cystocele, vulvar cyst, and vulvar irritation.  DISCHARGE DIAGNOSES: Symptomatic cystocele, vulvar cyst and vulvar inflammation, and squamous hyperplasia.  PROCEDURES:  Anterior colporrhaphy, removal of vulvar cyst, and vulvar biopsy.  COMPLICATIONS:  None.  CONSULTATIONS:  None.  HISTORY AND PHYSICAL:  Briefly, this is a 77 year old black female, gravida 5, para 4-0-1-4, with a symptomatic cystocele and increasing vulvar irritation from a small cyst and posterior irritation, admitted for surgical repair.  PAST MEDICAL HISTORY:  Detailed in her chart and is significant for four vaginal deliveries, gastroesophageal reflux disease, depression, insomnia, muscle spasms, hypertension, detrusor instability, fibromyalgia, and multiple sclerosis.  SIGNIFICANT PHYSICAL EXAMINATION:  PELVIC:  External genitalia reveals irritation posteriorly with a small sebaceous cyst at approximately 8 oclock.  She has a grade 2 to 3 cystocele, no rectocele, and good vaginal vault support.  She has no pelvic masses.  HOSPITAL COURSE:  The patient was admitted on the day of surgery and underwent the above-mentioned procedures without complications.  This was done under general anesthesia with a 50 cc blood loss.  Postoperatively, she did very well, had her vaginal packing removed on postoperative day #1 with mild staining.  Her Foley was discontinued, and she was able to void without difficulties and that evening was stable for discharge home.  Pathology returns with a benign squamous cyst and mild chronic inflammation and squamous hyperplasia of the vulva without dysplasia or malignancy.  CONDITION ON DISCHARGE:   Stable.  DISPOSITION:  Discharge to home.  DISCHARGE INSTRUCTIONS:  Her diet is a regular diet.  Her activity is pelvic rest and no strenuous activity.  Her followup is in two weeks and six weeks.  MEDICATIONS:  Percocet p.r.n. pain as well as an over-the-counter stool softener. DD:  03/14/00 TD:  03/14/00 Job: [id number]

## 2010-12-22 NOTE — Op Note (Signed)
[facility]  Patient:    [name], [name]                      MRN: [id number] Proc. Date: 12/20/99 Adm. Date:  [id number] Disc. Date: [id number] Attending:  Janalyn Rouse                           Operative Report  PREOPERATIVE DIAGNOSIS: 1. Intraductal papilloma. 2. Defective right breast implant.  POSTOPERATIVE DIAGNOSIS: 1. Intraductal papilloma. 2. Defective right breast implant.  OPERATION PERFORMED: 1. Excision of intraductal papilloma. 2. Implant replacement to be done by Dr. Dan Humphreys.  SURGEON:  Rose Phi. Maple Hudson, M.D.  ANESTHESIA:  General.  DESCRIPTION OF PROCEDURE:  After suitable general anesthesia was induced, the patient was placed in supine position and the right breast prepped and draped in the usual sterile fashion.  The previously diagnosed intraductal papilloma was at the 2 oclock position.  A circumareolar incision centered on the 2 oclock position of the right breast was then made and then we dissected under the areola to the nipple and then excised a wedge of duct tissue from there. Since she had had a previous subcutaneous mastectomy, she did not have a lot of breast tissue.  The excision was extended to the pectoralis major muscle as the implant was subpectoral in location.  We had good hemostasis.  I closed the incision with subcuticular 4-0 Monocryl.  Dr. Dan Humphreys was to come in to replace the implant and that will be dictated in a separate note. DD:  12/20/99 TD:  12/22/99 Job: [id number]

## 2010-12-22 NOTE — Op Note (Signed)
[place]  Patient:    [name], [name] Visit Number: [id number] MRN: [id number]          Service Type: DSU Location: [facility] Attending Physician:  Alinda Deem Dictated by:   Alinda Deem, M.D. Proc. Date: 12/15/01 Admit Date:  12/15/2001                             Operative Report  PREOPERATIVE DIAGNOSIS:  Right foot chronic plantar fasciitis.  POSTOPERATIVE DIAGNOSIS:  Right foot chronic plantar fasciitis.  PROCEDURE:  Right foot endoscopic plantar fascia release.  SURGEON:  Alinda Deem, M.D.  ANESTHESIA:  General LMA.  ESTIMATED BLOOD LOSS:  Minimal.  FLUID REPLACEMENT:  500 cc of crystalloid.  DRAINS:  None.  TOURNIQUET TIME:  10 minutes.  INDICATION FOR PROCEDURE:  A 77 year old woman who underwent a left foot plantar fascia release some years ago with good relief.  Now has chronic right foot plantar fasciitis, who has failed conservative treatment and desires elective right foot plantar fascia release that will be done arthroscopically.  DESCRIPTION OF PROCEDURE:  The patient identified by arm band and taken to the operating room at [facility].  Appropriate anesthetic monitors are attached and general LMA anesthesia induced with the patient in a supine position.  A tourniquet was applied to the right ankle, right foot prepped and draped in the usual sterile fashion from the toes to the tourniquet.  Foot wrapped with an Esmarch bandage, tourniquet inflated to 300 mmHg.  Selecting an entry point 1 cm distal to and plantar to the leading edge of the calcaneal tubercle on the medial side, a stab wound was made with a #15 blade and the endoscopic plantar fascia trocar and cannula was then inserted into the right foot going from medial to the lateral, being careful to stay just plantar to the plantar fascial origin.  The slotted cannula was then rotated so we could visualize the plantar fascia after  completing the insertion of the cannula by making a small stab wound laterally.  We then probed the extent of the plantar fascia medial to lateral and then selected a triangular blade and cut the plantar fascia from medial to lateral completely.  Dorsiflexion of the toes confirmed the separation of the plantar fascia fibers, and the foot was then washed out with normal saline solution.  The cannula was removed, stab wounds were anesthetized with 0.5% Marcaine solution, about a total of 6 cc.  A dressing of Xeroform, 4 x 4 dressing sponges, Webril, and an Ace wrap applied, followed by a Darby shoe.  The patient was awakened and taken to the recovery room without difficulty. Dictated by:   Alinda Deem, M.D. Attending Physician:  Alinda Deem DD:  12/15/01 TD:  12/16/01 Job: [id number]

## 2010-12-22 NOTE — Discharge Summary (Signed)
[place]  Patient:    [name], [name]                      MRN: [id number] Adm. Date:  [id number] Disc. Date: [id number] Attending:  Tressie Stalker D                           Discharge Summary  For full details of this admission, please refer to the typed history and physical.  BRIEF HISTORY:  The patient is a 77 year old white female who suffered from back and bilateral leg pain.  She has failed extensive nonsurgical management, including time, medications, physical therapy, sterile injections, etc.  She was worked up with a lumbar MRI which demonstrated a grade 1 spondylolisthesis at L4-5 with spinal stenosis.  I discussed the various treatment options with her, including doing nothing, continued medical management, and surgery.  The patient has weighed the risks, benefits, and alternatives of surgery and has decided to proceed with surgery as she has failed medical management.  For past medical history, past surgical history, medications prior to admission, drug allergies, family medical history, social history, admission physical exam, imaging studies, assessment and plan, etc., please refer to the typed history and physical.  HOSPITAL COURSE:  I performed a bilateral L4 laminotomy and foraminotomy with posterior lumbar interbody fusion at L4-5, insertion of tangent cortical bone dowels, posterior segmental instrumentation at L4-5 with insertion of CD Horizon pedicle screws and rods, and posterolateral arthrodesis with localized morcellized autograft bone and Vitos bone substitute without complications on January 16, 2001.  (For full details of this operation, please refer to the typed operative note.)  POSTOPERATIVE COURSE:  The patients postoperative course was as follows:  She She was initially maintained on a morphine PCA pump.  This and the Foley were discontinued on January 18, 2001.  The patient was somewhat slow to mobilize, but by  January 21, 2001, she was afebrile, vital signs stable, she was eating well and ambulating well, and her wound was healing well without signs of infection.  She was requesting discharge to home.  She was therefore discharged home on January 21, 2001.  DISCHARGE INSTRUCTIONS:  The patient was given written discharge instructions and instructed to follow up with me in four weeks.  DISCHARGE MEDICATIONS:  The patient was instructed to resume her outpatient medical regimen and given prescriptions for Percocet 5 mg, #60, one to two p.o. q.4h. p.r.n. for pain, limit eight per day with no refills and Valium 5 mg, #50, one p.o. q.6h. p.r.n. muscle spasm with no refill.  FINAL DIAGNOSES:  L3-4 grade 1 acquired spondylolisthesis, spinal stenosis, degenerative disease, lumbago, and lumbar radiculopathy.  PROCEDURES PERFORMED:  Bilateral L4 laminotomy, foraminotomy, and diskectomy; L4-5 posterior body lumbar interbody fusion with insertion of tangent cortical bone dowels; posterior nonsegmental instrumentation of L4-5 with insertion of CD Horizon titanium pedicle screws and rods; posterolateral arthrodesis with local morcellized autograft bone and Vitos bone substitute. DD:  02/20/01 TD:  02/23/01 Job: [id number]

## 2010-12-22 NOTE — Op Note (Signed)
NAMEKASHAE, CARSTENS NO.:  [id number]   MEDICAL RECORD NO.:  [id number]          PATIENT TYPE:  OIB   LOCATION:  2899                         FACILITY:  [facility]   PHYSICIAN:  Doylene Canning. Ladona Ridgel, M.D.  DATE OF BIRTH:  [birth year]   DATE OF PROCEDURE:  04/12/2005  DATE OF DISCHARGE:                                 OPERATIVE REPORT   PROCEDURE PERFORMED:  Head-up tilt table testing.   INDICATIONS:  Recurrent unexplained spells of altered consciousness.   INTRODUCTION:  The patient is a 77 year old woman with a history of spells  and multiple sclerosis. She has a history of cerebrovascular disease with  strokes. She is now referred for head-up tilt table testing. Of note, the  patient did not have evidence of orthostasis with initial orthostatic vital  signs measurements.   DESCRIPTION OF PROCEDURE:  After informed consent was obtained, the patient  was taken to the diagnostic catheterization lab in the fasted state. After  the usual preparation, she was placed in the supine position. Her blood  pressure was 119/62 and her pulse was 80. She was maintained in this  position for approximately 10 minutes. She was placed in the 70 degree head-  up tilt table position. Her blood pressure remained fairly stable in the 120  to 130 range. Her heart rate remained in the 80 to 85 beat per minute range.  At approximately 35 minutes in tilting, the patient became somewhat queasy  and had a transient drop in her blood pressure into the 90s with no change  in heart rate. Subsequently, the blood pressure remained stabilized in the  110 range. The blood pressure was maintained in this position for a total of  45 minutes. Near the end of tilting, she underwent right and left sequential  carotid sinus massages which had no effect on her heart rate or blood  pressure. She was then returned to her room in satisfactory condition.   COMPLICATIONS:  There were no immediate procedure  complications.   RESULTS:  This demonstrated a negative head-up tilt table test for inducible  syncope. There was no evidence of any posterolateral orthostatic tachycardia  syndrome. There was no evidence of carotid sinus hypersensitivity.           ______________________________  Doylene Canning. Ladona Ridgel, M.D.     GWT/MEDQ  D:  04/12/2005  T:  04/12/2005  Job:  [id number]   cc:   Dario Guardian, M.D.  Fax: [phone]

## 2010-12-22 NOTE — Cardiovascular Report (Signed)
[employer]  Patient:    [name], [name] Visit Number: [id number] MRN: [id number]          Service Type: CAT Location: [facility] 2853 [id number] Attending Physician:  Armanda Magic Dictated by:   Armanda Magic, M.D. Proc. Date: 10/23/01 Admit Date:  10/23/2001 Discharge Date: 10/23/2001   CC:         Dr. Marina Goodell   Cardiac Catheterization  REFERRING PHYSICIAN:  Dr. Marina Goodell.  CHIEF COMPLAINT:  This is a 77 year old white female with a history of fibromyalgia who presents with persistent episodes of substernal chest pain and shortness of breath.  Adenosine Cardiolite showed a very small apical defect that could be consistent with underlying ischemia although in a very small territory versus an artifact.  Because of her continued chest pain she presents now for cardiac catheterization.  PROCEDURE:  Left heart catheterization and coronary angiography, left ventriculography.  CARDIOLOGIST:  Armanda Magic, M.D.  INDICATIONS:  Chest pain.  COMPLICATIONS:  None.  INTRAVENOUS ACCESS:  Via right femoral artery, 6 French sheath.  DESCRIPTION OF PROCEDURE:  The patient was brought to the cardiac catheterization laboratory in the fasted, nonsedated state.  Informed consent was obtained.  The patient was connected to continuous heart rate and pulse oximetry monitoring and intermittent blood pressure monitoring.  The right groin was prepped and draped in the sterile fashion.  Then 1% Xylocaine was used for local anesthesia.  Using the modified Seldinger technique a 6 French sheath was placed in the right femoral artery.  Under fluoroscopic guidance, a 6 [place] JL catheter was placed in the right femoral artery.  Multiple cine films were taken in the 30 degree RAO and 40 degree LAO views.  This catheter was then exchanged out over the guidewire.  The 7 [place] JR4 catheter was placed under fluoroscopic guidance in the right coronary artery.  Multiple cine films were  taken in the 30 degree RAO and 40 degree LAO views.  This catheter was then exchanged out over a guidewire for a 6 French angled pigtail catheter which was placed under fluoroscopic guidance into the left ventricular cavity.  Left ventriculography was performed in the 30 degree RAO view using a total of 24 cc of contrast at 12 cc per second.  The catheter was then pulled back across the aortic valve with no significant gradient.  At the end of the procedure all catheters and sheaths were moved.  Manual compression was performed until adequate hemostasis was obtained.  The patient was transferred back to the room in stable condition.  RESULTS:  CORONARY ARTERIOGRAPHY: 1. The left main coronary artery was widely patent and bifurcates into the    left anterior descending artery and left circumflex artery. 2. The left anterior descending artery is widely patent throughout its course    and gives rise to one large diagonal branch which has a 20% luminal    irregularity proximally. 3. The left circumflex is widely patent throughout its course.  It gives rise    to two very small first obtuse marginal and second obtuse marginal vessels    and then gives off a third very large obtuse marginal branch and then    terminates distally into two arterial branches. 4. The right coronary artery is patent throughout its course and bifurcates    distally in the posterior descending artery and posterior lateral artery.  LEFT VENTRICULOGRAPHY:  The left ventriculography was performed in the 30 degree using a total of 24 cc of  contrast at 12 cc per second shows normal left ventricular function and EF of 60%, mild mitral regurgitation, aortic pressure 159/83 mmHg, left ventricular pressure 159/10.  ASSESSMENT: 1. Normal coronary arteries except for 20% luminal irregularity of the    proximal portion of the first diagonal 2. Normal left ventricular function. 3. Noncardiac chest pain.  PLAN: 1.  Intravenous fluid hydration. 2. Discharged to home after bedrest. 3. Follow up with Dr. Mayford Knife in two weeks. Dictated by:   Armanda Magic, M.D. Attending Physician:  Armanda Magic DD:  10/23/01 TD:  10/24/01 Job: [id number]

## 2010-12-22 NOTE — Op Note (Signed)
[place]  Patient:    [name], [name]                      MRN: [id number] Proc. Date: 12/20/99 Adm. Date:  [id number] Disc. Date: [id number] Attending:  Janalyn Rouse                           Operative Report  PREOPERATIVE DIAGNOSIS:  Right breast periprosthetic implant capsular contracture.  POSTOPERATIVE DIAGNOSES: 1. Right breast periprosthetic implant capsular contracture. 2. Ruptured right breast silicone implant.  PROCEDURE PERFORMED: 1. Periprosthetic capsulectomy, right breast. 2. Immediate replacement of right breast implant with saline implant.  SURGEON:  Janet Berlin. Dan Humphreys, M.D.  FIRST ASSISTANT:  None.  ANESTHESIA:  General.  INDICATIONS:  The patient is a 77 year old woman who is scheduled to undergo joint surgical procedures by general surgery and plastic surgery.  She was found to have what was probably a right breast intraductal papilloma.  This has just been addressed by Dr. Maple Hudson in the operating room.  The plan was to also remove and replace her right breast implant, as she has a fairly dense capsular contracture and an implant that has been in place for some 20 years. We plan to remove the silicone implant and to replace this with a non-textured saline device.  I was able to get a hold of her old implant data and have ordered a replacement saline implant that is approximately the same volume.  PROSTHESIS DATA:  The mammary prosthesis placed was manufactured by [employer].  This was a style 1600 round, stock #[id number], lot #[id number], nominal volume 300 cc plus 25 cc.  DESCRIPTION OF PROCEDURE:  After Dr. Maple Hudson concluded his portion of the operation, responsibility for the remainder of the procedure was turned over to me.  The right breast had previously been sterilely prepped and draped prior to Dr. Maple Hudson beginning his procedure.  At this point, I felt that the _______ that Dr. Maple Hudson had utilized, via  a medial periareolar incision, would afford me the best exposure in order to carry out the planned capsulectomy.  I went ahead and reopened the wound which Dr. Maple Hudson had previously closed.  I also extended the wound slightly in an inferior direction around to approximately the 6 oclock position on the areola.  There was just a very scant amount of subcutaneous tissue present, as the patient had previously undergone a subcutaneous mastectomy.  Immediately visible in the base of the wound were the fibers of the pectoralis major muscle.  I divided the muscle in the direction of its fibers at approximately the midportion of the pectoralis major muscle.  Immediately apparent was the surface of the periprosthetic implant capsule.  Dissecting on the surface of the capsule, I freed it from the surrounding tissue.  A small nick was made in the capsule medially with immediate expression of quite a bit of liquid silicone.  It was obvious to me at this point that the implant had already been ruptured for an undetermined period of time.  I went ahead and opened the capsule and removed as much of the implant material as possible.  It was impossible to tell where the loss of integrity of the implant had occurred.  There was a substantial amount of liquid silicone within the capsule.  I removed this to the best of my ability and submitted the entire specimen to the pathologist.  Having  completed a major portion of the dissection previously, I was able to at this point, remove most of the capsule.  Only a small portion of the capsule directly posterior to the implant and overlying the ribs and intercostal spaces was left intact, as this was densely adherent.  I estimate that I was able to remove at least 75-80% of the capsule.  I copiously cleansed the pocket using the pulsatile lavage system and a total of 3 L of irrigant.  At this point, all the instruments that we had been using to this point  were discarded, and a change of glove was effected for the operating team.  Using clean instruments, I assured that hemostasis had been satisfactorily achieved. I went ahead and preplaced my pocket closure sutures using 3-0 Vicryl, separately tagging each of the sutures.  I placed saline - antibiotic solution in the pocket and allowed it to sit while I prepared the implant.  The entire field was carefully draped off with sterile towels.  The mammary prosthesis was sterilely opened on the field and all the air evacuated.  I filled the implant with 100 cc of injectable saline.  The previous placed antibiotic solution was aspirated from the pocket and the mixed saline implant inserted. I completed the filling of the implant and then removed the filled tube and capped the filled port with the provided tab.  The previously placed sutures were at this point tied down.  The subcutaneous space was irrigated again with antibiotic solution.  I closed the periareolar incision with interrupted, buried dermal sutures of 3-0 Vicryl, followed by placement of a running subcuticular suture of 4-0 Monocryl.  Steri-Strips were used to reinforce the wound closure.  I had inserted a flat Blake drain just prior to completing closure of the breast implant pocket.  This was secured externally with a single interrupted suture of 3-0 monofilament Prolene.  At this point in the procedure, the drain was hooked to close drainage suction, and noted to hold the vacuum satisfactorily.  Both the drain as well as the operative sites were sterilely dressed.  The patient was allowed to emerge from anesthesia and successfully extubated. She was taken to the postanesthesia care unit and will subsequently be transferred to the floor for a 23-hour observation period.  She left the operating room in stable and satisfactory condition having tolerated the procedure well.  Estimated blood loss during my portion of the procedure  was less than 30 cc. DD:  12/20/99 TD:  12/25/99 Job: [id number]

## 2010-12-22 NOTE — Op Note (Signed)
[facility]  Patient:    [name], [name]                      MRN: [id number] Proc. Date: 02/29/00 Adm. Date:  [id number] Disc. Date: [id number] Attending:  Michaele Offer                           Operative Report  PREOPERATIVE DIAGNOSES:  Cystocele, vulvar cyst and vulvar irritation.  POSTOPERATIVE DIAGNOSES:  Cystocele, vulvar cyst and vulvar irritation.  PROCEDURE:  Anterior colporrhaphy, removal of vulvar cyst and vulvar biopsy.  SURGEON:  Lavina Hamman, M.D.  ASSISTANT:  Tracey Harries, M.D.  ANESTHESIA:  General endotracheal tube.  ESTIMATED BLOOD LOSS:  50 cc.  CHEMOPROPHYLAXIS:  Ancef 1 gm preop.  FINDINGS:  Grade 2 cystocele and a small vulvar cyst at 7 to 8 oclock and vulvar irritation at the posterior fourchette and a little bit to the left side of this.  COUNTS:  Correct.  CONDITION:  Stable.  DESCRIPTION OF PROCEDURE:  After appropriate informed consent, the patient was taken to the operating room and placed in the dorsal supine position. Her legs were then placed in mobile stirrups to make sure that she was comfortable and had no significant back pain. General anesthesia was then induced and her perineum and vagina were prepped and draped in the usual sterile fashion and her bladder drained of a small amount of urine with a red rubber catheter. A weighted speculum was inserted into the vagina and the vagina cuff was grasped with along Allis clamps. A piece of vaginal mucosa was removed with scissors and the vagina was dissected in the midline from the vaginal cuff to within approximately 2 cm of the urethral meatus in the midline. The vaginal mucosa was then dissected laterally on both sides to free up the cystocele. Bleeding was controlled with electrocautery. There was evidence of scar tissue from her 2 previous anterior colporrhaphies but not as much as I had expected. Once the cystocele was mobilized, the fascia was  reapproximated in the midline with interrupted sutures of 2-0 Vicryl with good reduction of a cystocele. Excess vaginal mucosa was removed and the vagina was closed with a running locking suture of 2-0 Vicryl with adequate hemostasis and again adequate reduction of the cystocele.  Attention was then turned to the vulvar cyst. The cyst was grasped with an Allis clamp and removed sharply with scissors. Bleeding was controlled with electrocautery and the skin defect was closed with a running suture of 3-0 Vicryl. A vulvar biopsy was then taken at approximately 5 oclock just outside the hymenal ring. Again this was done sharply, bleeding controlled with electrocautery and the defect closed with running 3-0 Vicryl. The sides were hemostatic and had adequate cosmesis. The vagina was then suctioned and found to be adequately hemostatic. The vagina was packed with 2 inch iodoform gauze and a Foley catheter was placed. The patient was taken down from stirrups, extubated in the operating room and taken to the recovery room in stable condition after tolerating the procedure well. DD:  02/29/00 TD:  03/02/00 Job: [id number]

## 2011-05-07 LAB — GLUCOSE, CAPILLARY
Glucose-Capillary: 104 — ABNORMAL HIGH
Glucose-Capillary: 109 — ABNORMAL HIGH
Glucose-Capillary: 115 — ABNORMAL HIGH
Glucose-Capillary: 122 — ABNORMAL HIGH
Glucose-Capillary: 147 — ABNORMAL HIGH
Glucose-Capillary: 163 — ABNORMAL HIGH
Glucose-Capillary: 182 — ABNORMAL HIGH
Glucose-Capillary: 84
Glucose-Capillary: 95
Glucose-Capillary: 95
Glucose-Capillary: 97

## 2011-05-07 LAB — BASIC METABOLIC PANEL
BUN: 7
CO2: 26
Calcium: 8.5
Chloride: 108
Creatinine, Ser: 0.74
GFR calc Af Amer: >60
GFR calc Af Amer: >60
GFR calc non Af Amer: >60
Glucose, Bld: 105 — ABNORMAL HIGH
Potassium: 3.7
Potassium: 4.2
Sodium: 137

## 2011-05-07 LAB — CBC
HCT: 38.2
HCT: 39.4
Hemoglobin: 12.7
MCHC: 33.8
MCV: 89.8
RBC: 4.23
RBC: 4.38
RBC: 4.83
RDW: 13.7
WBC: 4.6
WBC: 4.7
WBC: 5.9

## 2011-05-07 LAB — STOOL CULTURE

## 2011-05-07 LAB — DIFFERENTIAL
Lymphocytes Relative: 28
Lymphs Abs: 1.6
Monocytes Relative: 6
Neutro Abs: 3.9
Neutrophils Relative %: 66

## 2011-05-07 LAB — POCT I-STAT, CHEM 8
BUN: 11
Creatinine, Ser: 0.7
Potassium: 4.1
Sodium: 142

## 2011-05-07 LAB — URINALYSIS, ROUTINE W REFLEX MICROSCOPIC
Bilirubin Urine: NEGATIVE
Ketones, ur: NEGATIVE
Nitrite: POSITIVE — AB
Urobilinogen, UA: 1

## 2011-05-07 LAB — CLOSTRIDIUM DIFFICILE EIA: C difficile Toxins A+B, EIA: NEGATIVE

## 2011-05-07 LAB — LACTIC ACID, PLASMA: Lactic Acid, Venous: 1.9

## 2011-05-07 LAB — URINE CULTURE: Colony Count: 8000

## 2011-05-07 LAB — OCCULT BLOOD X 1 CARD TO LAB, STOOL: Fecal Occult Bld: NEGATIVE

## 2011-09-26 ENCOUNTER — Encounter: Payer: Self-pay | Admitting: Internal Medicine

## 2013-08-06 DIAGNOSIS — C50919 Malignant neoplasm of unspecified site of unspecified female breast: Secondary | ICD-10-CM

## 2013-08-06 DIAGNOSIS — Z923 Personal history of irradiation: Secondary | ICD-10-CM

## 2013-08-06 HISTORY — PX: BREAST LUMPECTOMY: SHX2

## 2013-08-06 HISTORY — PX: BREAST BIOPSY: SHX20

## 2013-08-06 HISTORY — DX: Personal history of irradiation: Z92.3

## 2013-08-06 HISTORY — DX: Malignant neoplasm of unspecified site of unspecified female breast: C50.919

## 2014-01-12 DIAGNOSIS — F419 Anxiety disorder, unspecified: Secondary | ICD-10-CM

## 2014-01-12 DIAGNOSIS — Z9011 Acquired absence of right breast and nipple: Secondary | ICD-10-CM | POA: Insufficient documentation

## 2014-01-12 DIAGNOSIS — D0510 Intraductal carcinoma in situ of unspecified breast: Secondary | ICD-10-CM | POA: Insufficient documentation

## 2014-01-12 DIAGNOSIS — F32A Depression, unspecified: Secondary | ICD-10-CM | POA: Insufficient documentation

## 2014-01-12 DIAGNOSIS — I6782 Cerebral ischemia: Secondary | ICD-10-CM | POA: Insufficient documentation

## 2014-01-12 DIAGNOSIS — N3020 Other chronic cystitis without hematuria: Secondary | ICD-10-CM | POA: Insufficient documentation

## 2014-01-12 DIAGNOSIS — I10 Essential (primary) hypertension: Secondary | ICD-10-CM | POA: Insufficient documentation

## 2014-01-12 DIAGNOSIS — G5600 Carpal tunnel syndrome, unspecified upper limb: Secondary | ICD-10-CM | POA: Insufficient documentation

## 2014-01-12 DIAGNOSIS — M1990 Unspecified osteoarthritis, unspecified site: Secondary | ICD-10-CM | POA: Insufficient documentation

## 2014-01-12 DIAGNOSIS — F329 Major depressive disorder, single episode, unspecified: Secondary | ICD-10-CM | POA: Insufficient documentation

## 2014-01-12 DIAGNOSIS — E7800 Pure hypercholesterolemia, unspecified: Secondary | ICD-10-CM | POA: Insufficient documentation

## 2014-01-12 DIAGNOSIS — D0512 Intraductal carcinoma in situ of left breast: Secondary | ICD-10-CM | POA: Insufficient documentation

## 2014-01-12 DIAGNOSIS — E119 Type 2 diabetes mellitus without complications: Secondary | ICD-10-CM | POA: Insufficient documentation

## 2014-01-12 DIAGNOSIS — Z9889 Other specified postprocedural states: Secondary | ICD-10-CM | POA: Insufficient documentation

## 2014-01-12 DIAGNOSIS — E041 Nontoxic single thyroid nodule: Secondary | ICD-10-CM | POA: Insufficient documentation

## 2014-01-12 DIAGNOSIS — G939 Disorder of brain, unspecified: Secondary | ICD-10-CM | POA: Insufficient documentation

## 2014-01-12 DIAGNOSIS — R42 Dizziness and giddiness: Secondary | ICD-10-CM | POA: Insufficient documentation

## 2014-01-12 DIAGNOSIS — K5790 Diverticulosis of intestine, part unspecified, without perforation or abscess without bleeding: Secondary | ICD-10-CM | POA: Insufficient documentation

## 2014-01-12 DIAGNOSIS — G35 Multiple sclerosis: Secondary | ICD-10-CM | POA: Insufficient documentation

## 2014-01-12 DIAGNOSIS — E538 Deficiency of other specified B group vitamins: Secondary | ICD-10-CM | POA: Insufficient documentation

## 2014-01-13 DIAGNOSIS — R296 Repeated falls: Secondary | ICD-10-CM | POA: Insufficient documentation

## 2014-01-14 ENCOUNTER — Ambulatory Visit: Payer: Self-pay | Admitting: Internal Medicine

## 2014-01-18 ENCOUNTER — Ambulatory Visit: Payer: Self-pay | Admitting: Family Medicine

## 2014-01-25 ENCOUNTER — Encounter: Payer: Self-pay | Admitting: *Deleted

## 2014-02-03 ENCOUNTER — Ambulatory Visit: Payer: Self-pay | Admitting: Internal Medicine

## 2014-02-12 LAB — CBC CANCER CENTER
BASOS ABS: 0 x10 3/mm (ref 0.0–0.1)
Basophil %: 0.5 %
EOS PCT: 2.2 %
Eosinophil #: 0.1 x10 3/mm (ref 0.0–0.7)
HCT: 33.7 % — AB (ref 35.0–47.0)
HGB: 10.8 g/dL — ABNORMAL LOW (ref 12.0–16.0)
Lymphocyte #: 1.2 x10 3/mm (ref 1.0–3.6)
Lymphocyte %: 24.6 %
MCH: 27.6 pg (ref 26.0–34.0)
MCHC: 32.1 g/dL (ref 32.0–36.0)
MCV: 86 fL (ref 80–100)
Monocyte #: 0.3 x10 3/mm (ref 0.2–0.9)
Monocyte %: 6.3 %
NEUTROS ABS: 3.2 x10 3/mm (ref 1.4–6.5)
NEUTROS PCT: 66.4 %
Platelet: 190 x10 3/mm (ref 150–440)
RBC: 3.93 10*6/uL (ref 3.80–5.20)
RDW: 15.9 % — AB (ref 11.5–14.5)
WBC: 4.8 x10 3/mm (ref 3.6–11.0)

## 2014-02-23 LAB — CBC CANCER CENTER
BASOS PCT: 0.8 %
Basophil #: 0 x10 3/mm (ref 0.0–0.1)
Eosinophil #: 0.1 x10 3/mm (ref 0.0–0.7)
Eosinophil %: 1.4 %
HCT: 34.9 % — AB (ref 35.0–47.0)
HGB: 10.8 g/dL — AB (ref 12.0–16.0)
LYMPHS ABS: 1.2 x10 3/mm (ref 1.0–3.6)
LYMPHS PCT: 27.7 %
MCH: 26.8 pg (ref 26.0–34.0)
MCHC: 31 g/dL — ABNORMAL LOW (ref 32.0–36.0)
MCV: 87 fL (ref 80–100)
Monocyte #: 0.4 x10 3/mm (ref 0.2–0.9)
Monocyte %: 8.9 %
NEUTROS PCT: 61.2 %
Neutrophil #: 2.7 x10 3/mm (ref 1.4–6.5)
Platelet: 226 x10 3/mm (ref 150–440)
RBC: 4.03 10*6/uL (ref 3.80–5.20)
RDW: 15.5 % — AB (ref 11.5–14.5)
WBC: 4.4 x10 3/mm (ref 3.6–11.0)

## 2014-03-03 DIAGNOSIS — R55 Syncope and collapse: Secondary | ICD-10-CM | POA: Insufficient documentation

## 2014-03-05 LAB — CBC CANCER CENTER
BASOS ABS: 0 x10 3/mm (ref 0.0–0.1)
BASOS PCT: 0.5 %
EOS ABS: 0.1 x10 3/mm (ref 0.0–0.7)
EOS PCT: 1.2 %
HCT: 32.2 % — ABNORMAL LOW (ref 35.0–47.0)
HGB: 10.2 g/dL — ABNORMAL LOW (ref 12.0–16.0)
Lymphocyte #: 1.2 x10 3/mm (ref 1.0–3.6)
Lymphocyte %: 25.8 %
MCH: 27.2 pg (ref 26.0–34.0)
MCHC: 31.7 g/dL — AB (ref 32.0–36.0)
MCV: 86 fL (ref 80–100)
MONO ABS: 0.4 x10 3/mm (ref 0.2–0.9)
MONOS PCT: 7.8 %
NEUTROS ABS: 2.9 x10 3/mm (ref 1.4–6.5)
Neutrophil %: 64.7 %
PLATELETS: 215 x10 3/mm (ref 150–440)
RBC: 3.75 10*6/uL — AB (ref 3.80–5.20)
RDW: 15.7 % — ABNORMAL HIGH (ref 11.5–14.5)
WBC: 4.5 x10 3/mm (ref 3.6–11.0)

## 2014-03-06 ENCOUNTER — Ambulatory Visit: Payer: Self-pay | Admitting: Internal Medicine

## 2014-03-12 LAB — CBC CANCER CENTER
Basophil #: 0 x10 3/mm (ref 0.0–0.1)
Basophil %: 0.4 %
EOS PCT: 1.6 %
Eosinophil #: 0.1 x10 3/mm (ref 0.0–0.7)
HCT: 34.5 % — AB (ref 35.0–47.0)
HGB: 10.8 g/dL — ABNORMAL LOW (ref 12.0–16.0)
LYMPHS ABS: 1 x10 3/mm (ref 1.0–3.6)
LYMPHS PCT: 20.4 %
MCH: 27.2 pg (ref 26.0–34.0)
MCHC: 31.3 g/dL — ABNORMAL LOW (ref 32.0–36.0)
MCV: 87 fL (ref 80–100)
MONO ABS: 0.4 x10 3/mm (ref 0.2–0.9)
MONOS PCT: 7.9 %
Neutrophil #: 3.3 x10 3/mm (ref 1.4–6.5)
Neutrophil %: 69.7 %
Platelet: 192 x10 3/mm (ref 150–440)
RBC: 3.98 10*6/uL (ref 3.80–5.20)
RDW: 15.4 % — ABNORMAL HIGH (ref 11.5–14.5)
WBC: 4.8 x10 3/mm (ref 3.6–11.0)

## 2014-03-19 LAB — CBC CANCER CENTER
BASOS ABS: 0 x10 3/mm (ref 0.0–0.1)
BASOS PCT: 0.5 %
EOS ABS: 0.1 x10 3/mm (ref 0.0–0.7)
EOS PCT: 1.7 %
HCT: 31.6 % — AB (ref 35.0–47.0)
HGB: 9.9 g/dL — ABNORMAL LOW (ref 12.0–16.0)
LYMPHS ABS: 0.9 x10 3/mm — AB (ref 1.0–3.6)
Lymphocyte %: 26.7 %
MCH: 27 pg (ref 26.0–34.0)
MCHC: 31.4 g/dL — ABNORMAL LOW (ref 32.0–36.0)
MCV: 86 fL (ref 80–100)
MONO ABS: 0.4 x10 3/mm (ref 0.2–0.9)
Monocyte %: 10.7 %
Neutrophil #: 2.1 x10 3/mm (ref 1.4–6.5)
Neutrophil %: 60.4 %
Platelet: 169 x10 3/mm (ref 150–440)
RBC: 3.66 10*6/uL — ABNORMAL LOW (ref 3.80–5.20)
RDW: 15.6 % — AB (ref 11.5–14.5)
WBC: 3.6 x10 3/mm (ref 3.6–11.0)

## 2014-03-26 LAB — CBC CANCER CENTER
BASOS PCT: 0.4 %
Basophil #: 0 x10 3/mm (ref 0.0–0.1)
EOS ABS: 0.1 x10 3/mm (ref 0.0–0.7)
Eosinophil %: 1.8 %
HCT: 33.8 % — ABNORMAL LOW (ref 35.0–47.0)
HGB: 10.7 g/dL — ABNORMAL LOW (ref 12.0–16.0)
LYMPHS ABS: 0.9 x10 3/mm — AB (ref 1.0–3.6)
Lymphocyte %: 23.2 %
MCH: 27.2 pg (ref 26.0–34.0)
MCHC: 31.8 g/dL — AB (ref 32.0–36.0)
MCV: 86 fL (ref 80–100)
MONO ABS: 0.4 x10 3/mm (ref 0.2–0.9)
MONOS PCT: 9.4 %
Neutrophil #: 2.6 x10 3/mm (ref 1.4–6.5)
Neutrophil %: 65.2 %
Platelet: 195 x10 3/mm (ref 150–440)
RBC: 3.95 10*6/uL (ref 3.80–5.20)
RDW: 15.7 % — AB (ref 11.5–14.5)
WBC: 4 x10 3/mm (ref 3.6–11.0)

## 2014-04-06 ENCOUNTER — Ambulatory Visit: Payer: Self-pay | Admitting: Internal Medicine

## 2014-05-07 ENCOUNTER — Ambulatory Visit: Payer: Self-pay | Admitting: Internal Medicine

## 2014-05-12 ENCOUNTER — Ambulatory Visit: Payer: Self-pay | Admitting: Urology

## 2014-07-14 ENCOUNTER — Ambulatory Visit: Payer: Self-pay | Admitting: Internal Medicine

## 2014-07-14 LAB — CBC CANCER CENTER
Basophil #: 0 x10 3/mm (ref 0.0–0.1)
Basophil %: 0.5 %
EOS ABS: 0.1 x10 3/mm (ref 0.0–0.7)
Eosinophil %: 2.3 %
HCT: 32 % — AB (ref 35.0–47.0)
HGB: 9.8 g/dL — ABNORMAL LOW (ref 12.0–16.0)
LYMPHS ABS: 1.2 x10 3/mm (ref 1.0–3.6)
LYMPHS PCT: 30.6 %
MCH: 24.8 pg — AB (ref 26.0–34.0)
MCHC: 30.8 g/dL — ABNORMAL LOW (ref 32.0–36.0)
MCV: 81 fL (ref 80–100)
Monocyte #: 0.4 x10 3/mm (ref 0.2–0.9)
Monocyte %: 9.8 %
NEUTROS PCT: 56.8 %
Neutrophil #: 2.2 x10 3/mm (ref 1.4–6.5)
Platelet: 206 x10 3/mm (ref 150–440)
RBC: 3.96 10*6/uL (ref 3.80–5.20)
RDW: 16.5 % — AB (ref 11.5–14.5)
WBC: 3.9 x10 3/mm (ref 3.6–11.0)

## 2014-07-14 LAB — CREATININE, SERUM
Creatinine: 1.01 mg/dL (ref 0.60–1.30)
EGFR (Non-African Amer.): 56 — ABNORMAL LOW

## 2014-07-14 LAB — HEPATIC FUNCTION PANEL A (ARMC)
ALK PHOS: 47 U/L
Albumin: 3.7 g/dL (ref 3.4–5.0)
BILIRUBIN TOTAL: 0.2 mg/dL (ref 0.2–1.0)
Bilirubin, Direct: 0.1 mg/dL (ref 0.0–0.2)
SGOT(AST): 4 U/L — ABNORMAL LOW (ref 15–37)
SGPT (ALT): 19 U/L
TOTAL PROTEIN: 6.8 g/dL (ref 6.4–8.2)

## 2014-07-21 ENCOUNTER — Ambulatory Visit: Payer: Self-pay | Admitting: Family Medicine

## 2014-07-23 ENCOUNTER — Emergency Department: Payer: Self-pay | Admitting: Emergency Medicine

## 2014-07-23 LAB — BASIC METABOLIC PANEL
Anion Gap: 3 — ABNORMAL LOW (ref 7–16)
BUN: 16 mg/dL (ref 7–18)
CREATININE: 0.95 mg/dL (ref 0.60–1.30)
Calcium, Total: 9.4 mg/dL (ref 8.5–10.1)
Chloride: 108 mmol/L — ABNORMAL HIGH (ref 98–107)
Co2: 31 mmol/L (ref 21–32)
EGFR (African American): >60
GFR CALC NON AF AMER: 60 — AB
Glucose: 105 mg/dL — ABNORMAL HIGH (ref 65–99)
OSMOLALITY: 285 (ref 275–301)
POTASSIUM: 4.2 mmol/L (ref 3.5–5.1)
Sodium: 142 mmol/L (ref 136–145)

## 2014-07-23 LAB — URINALYSIS, COMPLETE
BACTERIA: NONE SEEN
Bilirubin,UR: NEGATIVE
GLUCOSE, UR: NEGATIVE mg/dL (ref 0–75)
Ketone: NEGATIVE
LEUKOCYTE ESTERASE: NEGATIVE
Nitrite: NEGATIVE
PROTEIN: NEGATIVE
Ph: 5 (ref 4.5–8.0)
RBC,UR: 2 /HPF (ref 0–5)
SPECIFIC GRAVITY: 1.021 (ref 1.003–1.030)

## 2014-07-23 LAB — CBC
HCT: 33.2 % — AB (ref 35.0–47.0)
HGB: 10.3 g/dL — AB (ref 12.0–16.0)
MCH: 25.1 pg — ABNORMAL LOW (ref 26.0–34.0)
MCHC: 30.9 g/dL — AB (ref 32.0–36.0)
MCV: 81 fL (ref 80–100)
Platelet: 204 10*3/uL (ref 150–440)
RBC: 4.1 10*6/uL (ref 3.80–5.20)
RDW: 16.9 % — AB (ref 11.5–14.5)
WBC: 5.1 10*3/uL (ref 3.6–11.0)

## 2014-08-06 ENCOUNTER — Ambulatory Visit: Payer: Self-pay | Admitting: Internal Medicine

## 2014-08-09 DIAGNOSIS — N3010 Interstitial cystitis (chronic) without hematuria: Secondary | ICD-10-CM | POA: Diagnosis not present

## 2014-08-16 DIAGNOSIS — N3010 Interstitial cystitis (chronic) without hematuria: Secondary | ICD-10-CM | POA: Diagnosis not present

## 2014-08-17 DIAGNOSIS — E042 Nontoxic multinodular goiter: Secondary | ICD-10-CM | POA: Diagnosis not present

## 2014-08-18 DIAGNOSIS — M720 Palmar fascial fibromatosis [Dupuytren]: Secondary | ICD-10-CM | POA: Diagnosis not present

## 2014-08-23 DIAGNOSIS — N3010 Interstitial cystitis (chronic) without hematuria: Secondary | ICD-10-CM | POA: Diagnosis not present

## 2014-08-26 ENCOUNTER — Emergency Department: Payer: Self-pay | Admitting: Internal Medicine

## 2014-08-26 DIAGNOSIS — S7001XA Contusion of right hip, initial encounter: Secondary | ICD-10-CM | POA: Diagnosis not present

## 2014-08-26 DIAGNOSIS — S1093XA Contusion of unspecified part of neck, initial encounter: Secondary | ICD-10-CM | POA: Diagnosis not present

## 2014-08-26 DIAGNOSIS — Z23 Encounter for immunization: Secondary | ICD-10-CM | POA: Diagnosis not present

## 2014-08-26 DIAGNOSIS — F419 Anxiety disorder, unspecified: Secondary | ICD-10-CM | POA: Diagnosis not present

## 2014-08-26 DIAGNOSIS — S0990XA Unspecified injury of head, initial encounter: Secondary | ICD-10-CM | POA: Diagnosis not present

## 2014-08-26 DIAGNOSIS — M25551 Pain in right hip: Secondary | ICD-10-CM | POA: Diagnosis not present

## 2014-08-26 DIAGNOSIS — S79911A Unspecified injury of right hip, initial encounter: Secondary | ICD-10-CM | POA: Diagnosis not present

## 2014-08-26 DIAGNOSIS — S0993XA Unspecified injury of face, initial encounter: Secondary | ICD-10-CM | POA: Diagnosis not present

## 2014-08-26 DIAGNOSIS — S199XXA Unspecified injury of neck, initial encounter: Secondary | ICD-10-CM | POA: Diagnosis not present

## 2014-08-26 DIAGNOSIS — R9431 Abnormal electrocardiogram [ECG] [EKG]: Secondary | ICD-10-CM | POA: Diagnosis not present

## 2014-08-26 DIAGNOSIS — S0093XA Contusion of unspecified part of head, initial encounter: Secondary | ICD-10-CM | POA: Diagnosis not present

## 2014-08-26 DIAGNOSIS — S0083XA Contusion of other part of head, initial encounter: Secondary | ICD-10-CM | POA: Diagnosis not present

## 2014-08-26 LAB — CBC WITH DIFFERENTIAL/PLATELET
Basophil #: 0 10*3/uL (ref 0.0–0.1)
Basophil %: 0.4 %
EOS PCT: 1.3 %
Eosinophil #: 0.1 10*3/uL (ref 0.0–0.7)
HCT: 32.5 % — AB (ref 35.0–47.0)
HGB: 10 g/dL — AB (ref 12.0–16.0)
LYMPHS ABS: 1.2 10*3/uL (ref 1.0–3.6)
LYMPHS PCT: 27.5 %
MCH: 24.8 pg — AB (ref 26.0–34.0)
MCHC: 30.8 g/dL — AB (ref 32.0–36.0)
MCV: 80 fL (ref 80–100)
MONO ABS: 0.4 x10 3/mm (ref 0.2–0.9)
Monocyte %: 8.2 %
NEUTROS ABS: 2.7 10*3/uL (ref 1.4–6.5)
Neutrophil %: 62.6 %
Platelet: 208 10*3/uL (ref 150–440)
RBC: 4.04 10*6/uL (ref 3.80–5.20)
RDW: 16.9 % — ABNORMAL HIGH (ref 11.5–14.5)
WBC: 4.4 10*3/uL (ref 3.6–11.0)

## 2014-08-26 LAB — URINALYSIS, COMPLETE
BACTERIA: NONE SEEN
RBC,UR: 1 /HPF (ref 0–5)
Specific Gravity: 1.023 (ref 1.003–1.030)

## 2014-08-26 LAB — BASIC METABOLIC PANEL
ANION GAP: 8 (ref 7–16)
BUN: 18 mg/dL (ref 7–18)
CALCIUM: 8.7 mg/dL (ref 8.5–10.1)
CO2: 27 mmol/L (ref 21–32)
Chloride: 107 mmol/L (ref 98–107)
Creatinine: 1.13 mg/dL (ref 0.60–1.30)
GFR CALC AF AMER: 59 — AB
GFR CALC NON AF AMER: 49 — AB
GLUCOSE: 94 mg/dL (ref 65–99)
Osmolality: 285 (ref 275–301)
Potassium: 4.3 mmol/L (ref 3.5–5.1)
SODIUM: 142 mmol/L (ref 136–145)

## 2014-08-26 LAB — TROPONIN I

## 2014-08-30 DIAGNOSIS — N3010 Interstitial cystitis (chronic) without hematuria: Secondary | ICD-10-CM | POA: Diagnosis not present

## 2014-09-01 DIAGNOSIS — E041 Nontoxic single thyroid nodule: Secondary | ICD-10-CM | POA: Diagnosis not present

## 2014-09-03 DIAGNOSIS — N644 Mastodynia: Secondary | ICD-10-CM | POA: Diagnosis not present

## 2014-09-06 DIAGNOSIS — N3010 Interstitial cystitis (chronic) without hematuria: Secondary | ICD-10-CM | POA: Diagnosis not present

## 2014-09-07 ENCOUNTER — Ambulatory Visit: Payer: Self-pay | Admitting: Family Medicine

## 2014-09-07 DIAGNOSIS — R0781 Pleurodynia: Secondary | ICD-10-CM | POA: Diagnosis not present

## 2014-09-07 DIAGNOSIS — E109 Type 1 diabetes mellitus without complications: Secondary | ICD-10-CM | POA: Diagnosis not present

## 2014-09-07 DIAGNOSIS — Z0100 Encounter for examination of eyes and vision without abnormal findings: Secondary | ICD-10-CM | POA: Diagnosis not present

## 2014-09-07 DIAGNOSIS — M47894 Other spondylosis, thoracic region: Secondary | ICD-10-CM | POA: Diagnosis not present

## 2014-09-07 DIAGNOSIS — R0789 Other chest pain: Secondary | ICD-10-CM | POA: Diagnosis not present

## 2014-09-07 DIAGNOSIS — S299XXA Unspecified injury of thorax, initial encounter: Secondary | ICD-10-CM | POA: Diagnosis not present

## 2014-09-07 DIAGNOSIS — I10 Essential (primary) hypertension: Secondary | ICD-10-CM | POA: Diagnosis not present

## 2014-09-13 DIAGNOSIS — N3010 Interstitial cystitis (chronic) without hematuria: Secondary | ICD-10-CM | POA: Diagnosis not present

## 2014-09-23 DIAGNOSIS — N3010 Interstitial cystitis (chronic) without hematuria: Secondary | ICD-10-CM | POA: Diagnosis not present

## 2014-09-30 DIAGNOSIS — N3010 Interstitial cystitis (chronic) without hematuria: Secondary | ICD-10-CM | POA: Diagnosis not present

## 2014-10-04 DIAGNOSIS — N3010 Interstitial cystitis (chronic) without hematuria: Secondary | ICD-10-CM | POA: Diagnosis not present

## 2014-10-05 DIAGNOSIS — H469 Unspecified optic neuritis: Secondary | ICD-10-CM | POA: Diagnosis not present

## 2014-10-13 DIAGNOSIS — H469 Unspecified optic neuritis: Secondary | ICD-10-CM | POA: Diagnosis not present

## 2014-10-14 ENCOUNTER — Ambulatory Visit
Admit: 2014-10-14 | Disposition: A | Discharge status: Home / Self Care | Payer: Self-pay | Attending: Internal Medicine | Admitting: Internal Medicine

## 2014-10-15 DIAGNOSIS — D0590 Unspecified type of carcinoma in situ of unspecified breast: Secondary | ICD-10-CM | POA: Diagnosis not present

## 2014-10-20 ENCOUNTER — Ambulatory Visit: Payer: Self-pay | Admitting: Family Medicine

## 2014-10-20 DIAGNOSIS — M19041 Primary osteoarthritis, right hand: Secondary | ICD-10-CM | POA: Diagnosis not present

## 2014-10-20 DIAGNOSIS — M79641 Pain in right hand: Secondary | ICD-10-CM | POA: Diagnosis not present

## 2014-10-20 DIAGNOSIS — E041 Nontoxic single thyroid nodule: Secondary | ICD-10-CM | POA: Diagnosis not present

## 2014-10-20 DIAGNOSIS — M8580 Other specified disorders of bone density and structure, unspecified site: Secondary | ICD-10-CM | POA: Diagnosis not present

## 2014-10-22 DIAGNOSIS — R591 Generalized enlarged lymph nodes: Secondary | ICD-10-CM | POA: Diagnosis not present

## 2014-10-25 DIAGNOSIS — N63 Unspecified lump in breast: Secondary | ICD-10-CM | POA: Diagnosis not present

## 2014-10-25 DIAGNOSIS — R928 Other abnormal and inconclusive findings on diagnostic imaging of breast: Secondary | ICD-10-CM | POA: Diagnosis not present

## 2014-10-25 DIAGNOSIS — Z853 Personal history of malignant neoplasm of breast: Secondary | ICD-10-CM | POA: Diagnosis not present

## 2014-11-03 ENCOUNTER — Ambulatory Visit
Admit: 2014-11-03 | Disposition: A | Discharge status: Home / Self Care | Payer: Self-pay | Attending: Internal Medicine | Admitting: Internal Medicine

## 2014-11-12 ENCOUNTER — Emergency Department
Admit: 2014-11-12 | Disposition: A | Discharge status: Home / Self Care | Payer: Self-pay | Admitting: Emergency Medicine

## 2014-11-12 DIAGNOSIS — I10 Essential (primary) hypertension: Secondary | ICD-10-CM | POA: Diagnosis not present

## 2014-11-12 DIAGNOSIS — R109 Unspecified abdominal pain: Secondary | ICD-10-CM | POA: Diagnosis not present

## 2014-11-12 DIAGNOSIS — R300 Dysuria: Secondary | ICD-10-CM | POA: Diagnosis not present

## 2014-11-12 DIAGNOSIS — K5730 Diverticulosis of large intestine without perforation or abscess without bleeding: Secondary | ICD-10-CM | POA: Diagnosis not present

## 2014-11-12 DIAGNOSIS — R1031 Right lower quadrant pain: Secondary | ICD-10-CM | POA: Diagnosis not present

## 2014-11-12 DIAGNOSIS — R110 Nausea: Secondary | ICD-10-CM | POA: Diagnosis not present

## 2014-11-12 DIAGNOSIS — K5900 Constipation, unspecified: Secondary | ICD-10-CM | POA: Diagnosis not present

## 2014-11-12 DIAGNOSIS — E119 Type 2 diabetes mellitus without complications: Secondary | ICD-10-CM | POA: Diagnosis not present

## 2014-11-12 DIAGNOSIS — N390 Urinary tract infection, site not specified: Secondary | ICD-10-CM | POA: Diagnosis not present

## 2014-11-12 LAB — URINALYSIS, COMPLETE
Bacteria: NONE SEEN
Bilirubin,UR: NEGATIVE
Glucose,UR: NEGATIVE mg/dL (ref 0–75)
KETONE: NEGATIVE
Leukocyte Esterase: NEGATIVE
NITRITE: POSITIVE
PH: 5 (ref 4.5–8.0)
PROTEIN: NEGATIVE
SPECIFIC GRAVITY: 1.021 (ref 1.003–1.030)

## 2014-11-12 LAB — CBC
HCT: 31.1 % — ABNORMAL LOW (ref 35.0–47.0)
HGB: 9.5 g/dL — AB (ref 12.0–16.0)
MCH: 23.3 pg — ABNORMAL LOW (ref 26.0–34.0)
MCHC: 30.5 g/dL — AB (ref 32.0–36.0)
MCV: 77 fL — ABNORMAL LOW (ref 80–100)
Platelet: 187 10*3/uL (ref 150–440)
RBC: 4.06 10*6/uL (ref 3.80–5.20)
RDW: 16.7 % — ABNORMAL HIGH (ref 11.5–14.5)
WBC: 4.7 10*3/uL (ref 3.6–11.0)

## 2014-11-12 LAB — COMPREHENSIVE METABOLIC PANEL
ALT: 13 U/L — AB
Albumin: 3.8 g/dL
Alkaline Phosphatase: 36 U/L — ABNORMAL LOW
Anion Gap: 5 — ABNORMAL LOW (ref 7–16)
BILIRUBIN TOTAL: 0.2 mg/dL — AB
BUN: 14 mg/dL
CHLORIDE: 109 mmol/L
CREATININE: 0.91 mg/dL
Calcium, Total: 9.1 mg/dL
Co2: 26 mmol/L
EGFR (African American): >60
GFR CALC NON AF AMER: 59 — AB
GLUCOSE: 116 mg/dL — AB
POTASSIUM: 4.2 mmol/L
SGOT(AST): 13 U/L — ABNORMAL LOW
SODIUM: 140 mmol/L
TOTAL PROTEIN: 6.5 g/dL

## 2014-11-12 LAB — TROPONIN I: Troponin-I: <0.03 ng/mL

## 2014-11-12 LAB — LIPASE, BLOOD: Lipase: 46 U/L

## 2014-11-14 LAB — URINE CULTURE

## 2014-11-15 DIAGNOSIS — R42 Dizziness and giddiness: Secondary | ICD-10-CM | POA: Diagnosis not present

## 2014-11-15 DIAGNOSIS — A499 Bacterial infection, unspecified: Secondary | ICD-10-CM | POA: Diagnosis not present

## 2014-11-15 DIAGNOSIS — N390 Urinary tract infection, site not specified: Secondary | ICD-10-CM | POA: Diagnosis not present

## 2014-11-25 DIAGNOSIS — E119 Type 2 diabetes mellitus without complications: Secondary | ICD-10-CM | POA: Diagnosis not present

## 2014-11-25 DIAGNOSIS — K580 Irritable bowel syndrome with diarrhea: Secondary | ICD-10-CM | POA: Diagnosis not present

## 2014-11-27 NOTE — Consult Note (Signed)
Reason for Visit: This 81 year old Female patient presents to the clinic for initial evaluation of  breast cancer .   Referred by Dr Ma Hillock.  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 in April 2015 she had a wide local excision of left breast performed in [place] forductal carcinoma in situ. Tumor was high-grade ER positive PR negative. Margins were clear but close at less than 1 mm with no tumor on inked margin. Tumor was approximately 1.5 cm in greatest dimension. The patient has done well postoperatively. She does have an implantable holder device in her left anterior chest wall. She has multiple comorbidities including multiple sclerosis SVT fibromyalgia hypertension B12 deficiency and TIAs. She had a mastectomy of the right breast in 1975 with reconstruction for what she described as mastitis. She had no adjuvant treatment. She's been seen by medical oncology and they are to discussing feasibility of tamoxifen therapy. She is now referred to radiation oncology for opinion.  Past, Family and Social History:  Past Medical History positive   Cardiovascular hyperlipidemia; hypertension; PST   Gastrointestinal GERD   Neurological/Psychiatric anxiety; depression; TIA; multiple sclerosis   Past Surgical History appendectomy; tubal ligation, cataract surgery, bladder tacking, hemorrhoidectomy, D&Cs bladder polyps   Past Medical History Comments vitamin D deficiency, vertigo, fibromyalgia   Family History positive    Family History Comments father with heart disease mother with history of breast cancer and ALS   Social History noncontributory   Additional Past Medical and Surgical History accompanied by daughter and nurse Navigator today   Allergies:   Levothroid: N/V/Diarrhea  Avonex: Swelling  Dexamethasone: Other  Home Meds:  Home Medications: Medication Instructions Status  metFORMIN 500 mg oral tablet 1 tab(s) orally 2 times a day Active  sertraline 50 mg oral tablet 1 tab(s) orally once a day Active  Vitamin D3 5000 intl units oral capsule 1 cap(s) orally once a day Active  rosuvastatin 5 mg oral tablet 1/2 tab(s) orally once a day (at bedtime) Active  lactobacillus acidophilus - oral capsule 1 cap(s) orally once a day Active  acetaminophen-oxyCODONE 325 mg-5 mg oral tablet 1 tab(s) orally every 6 hours, As Needed - for Pain Active  Lomotil 0.025 mg-2.5 mg oral tablet 1 tab(s) orally 2 times a day Active   Review of Systems:  General negative   Performance Status (ECOG) 0   Skin negative   Breast see HPI   Ophthalmologic see HPI   ENMT negative   Respiratory and Thorax negative   Cardiovascular see HPI   Gastrointestinal negative   Genitourinary negative   Musculoskeletal negative   Neurological see HPI   Psychiatric see HPI   Hematology/Lymphatics negative   Endocrine negative   Allergic/Immunologic negative   Review of Systems   review of systems obtained from nurses notes  Nursing Notes:  Nursing Vital Signs and Chemo Nursing Nursing Notes: *CC Vital Signs Flowsheet:   17-Jun-15 14:01  Temp Temperature 98.6  Pulse Pulse 73  Respirations Respirations 18  SBP SBP 161  DBP DBP 81  Pain Scale (0-10)  2  Current Weight (kg) (kg) 59.6   Physical Exam:  General/Skin/HEENT:  General normal   Skin normal   Eyes normal   ENMT normal   Head and Neck normal   Additional PE well-developed female in NAD. She's had breast reconstruction on the  right no dominant mass or nodularity is noted in either breast in 2 positions examined. No axillary or supraclavicular adenopathy is identified. She has an implantable device underneath her left anterior chest wall. Lungs are clear to A&P cardiac examination shows regular rate and rhythm.   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 Also posterior scar another 1600 cGy in 8 fractions using electron beam based on her close margin. We have done a preliminary research on her implantable Holter monitor. We will try will avoid direct beam into that region as much as possible. Also is have asked daughter to establish her with a cardiologist that can determine the viability of her Holter monitor and have her checked out after completion of radiation. Risks and benefits of treatment including skin reaction, fatigue, inclusion of superficial lung and possible heart explained in detail to the patient. They both seem to comprehend my treatment plan well. I have set her up for CT simulation the middle of next week.  I would like to take this opportunity for allowing me to participate in the care of your patient..  Electronic Signatures: Armstead Peaks (MD)  (Signed 17-Jun-15 15:17)  Authored: HPI, Diagnosis, PFSH, Allergies, Home Meds, ROS, Nursing Notes, Physical Exam, Relevent Results, Encounter Assessment and Plan   Last Updated:  17-Jun-15 15:17 by Armstead Peaks (MD)

## 2014-12-01 DIAGNOSIS — R198 Other specified symptoms and signs involving the digestive system and abdomen: Secondary | ICD-10-CM | POA: Diagnosis not present

## 2014-12-01 DIAGNOSIS — R1084 Generalized abdominal pain: Secondary | ICD-10-CM | POA: Diagnosis not present

## 2014-12-06 ENCOUNTER — Emergency Department: Payer: Commercial Managed Care - HMO

## 2014-12-06 ENCOUNTER — Emergency Department
Admission: EM | Admit: 2014-12-06 | Discharge: 2014-12-06 | Disposition: A | Discharge status: Home / Self Care | Payer: Commercial Managed Care - HMO | Attending: Emergency Medicine | Admitting: Emergency Medicine

## 2014-12-06 DIAGNOSIS — Y998 Other external cause status: Secondary | ICD-10-CM | POA: Insufficient documentation

## 2014-12-06 DIAGNOSIS — S99921A Unspecified injury of right foot, initial encounter: Secondary | ICD-10-CM | POA: Diagnosis not present

## 2014-12-06 DIAGNOSIS — S90111A Contusion of right great toe without damage to nail, initial encounter: Secondary | ICD-10-CM | POA: Insufficient documentation

## 2014-12-06 DIAGNOSIS — S0993XA Unspecified injury of face, initial encounter: Secondary | ICD-10-CM | POA: Diagnosis present

## 2014-12-06 DIAGNOSIS — E119 Type 2 diabetes mellitus without complications: Secondary | ICD-10-CM | POA: Diagnosis not present

## 2014-12-06 DIAGNOSIS — W01198A Fall on same level from slipping, tripping and stumbling with subsequent striking against other object, initial encounter: Secondary | ICD-10-CM | POA: Diagnosis not present

## 2014-12-06 DIAGNOSIS — Y9389 Activity, other specified: Secondary | ICD-10-CM | POA: Diagnosis not present

## 2014-12-06 DIAGNOSIS — Z79899 Other long term (current) drug therapy: Secondary | ICD-10-CM | POA: Diagnosis not present

## 2014-12-06 DIAGNOSIS — S90121A Contusion of right lesser toe(s) without damage to nail, initial encounter: Secondary | ICD-10-CM

## 2014-12-06 DIAGNOSIS — I10 Essential (primary) hypertension: Secondary | ICD-10-CM | POA: Diagnosis not present

## 2014-12-06 DIAGNOSIS — M79674 Pain in right toe(s): Secondary | ICD-10-CM | POA: Diagnosis not present

## 2014-12-06 DIAGNOSIS — S0181XA Laceration without foreign body of other part of head, initial encounter: Secondary | ICD-10-CM | POA: Diagnosis not present

## 2014-12-06 DIAGNOSIS — S0033XA Contusion of nose, initial encounter: Secondary | ICD-10-CM | POA: Diagnosis not present

## 2014-12-06 DIAGNOSIS — Y9289 Other specified places as the place of occurrence of the external cause: Secondary | ICD-10-CM | POA: Insufficient documentation

## 2014-12-06 DIAGNOSIS — S0992XA Unspecified injury of nose, initial encounter: Secondary | ICD-10-CM | POA: Diagnosis not present

## 2014-12-06 NOTE — ED Provider Notes (Signed)
[facility] Emergency Department Provider Note   ____________________________________________  Time seen: 1601  I have reviewed the triage vital signs and the nursing notes.   HISTORY  Chief Complaint Fall  HPI [name] is a 81 y.o. female with c/o facial & toe pain s/p fall 2 days PTA.  She describes stubbing her right big toe, causing her to fall hitting her face on the steps at home. She denies LOC, NV, vertigo, or headache.  She is here for evaluation of nasal injury & right great toe pain.   Past Medical History  Diagnosis Date  . GERD (gastroesophageal reflux disease)   . Vertigo   . Essential hypertension, benign   . Paroxysmal supraventricular tachycardia   . Pure hypercholesterolemia   . MS (multiple sclerosis)   . Fibromyalgia   . Diverticulosis   . Diabetes mellitus without complication   . Osteoarthritis   . Dyslipidemia   . SOB (shortness of breath)     SECONDARY TO REFLUX  . Chest pain     SECONDARY TO GERD  . Peripheral neuropathy     MILD    Patient Active Problem List   Diagnosis Date Noted  . DIABETES MELLITUS-TYPE II 06/26/2010  . DEPRESSION 06/26/2010  . HYPERTENSION 06/26/2010  . ESOPHAGEAL STRICTURE 06/26/2010  . GERD 06/26/2010  . INTESTINAL CYSTITIS 06/26/2010  . FIBROMYALGIA 06/26/2010  . PERSONAL HX BREAST CANCER 06/26/2010  . ESSENTIAL HYPERTENSION, BENIGN 06/01/2009  . PSVT 06/01/2009  . IRRITABLE BOWEL SYNDROME 06/16/2008  . DIARRHEA 06/16/2008  . ABDOMINAL PAIN, LEFT LOWER QUADRANT 06/16/2008    Past Surgical History  Procedure Laterality Date  . Total abdominal hysterectomy w/ bilateral salpingoophorectomy    . Mastectomy Right     DX MASTITIS/NO CANCER.Marland KitchenSALINE IMPLANT  . Cholecystectomy    . Hemorrhoid surgery      WITH RECONSTRUCTION  . Bladder tacking      X 3  . Eye surgeries      WITH BUCKLE DETACHMENT OF THE RETINA  . Tonsillectomy    . Appendectomy    . Tympanoplasty       Current Outpatient Rx  Name  Route  Sig  Dispense  Refill  . cetirizine (ZYRTEC) 10 MG tablet   Oral   Take 10 mg by mouth daily.         . diazepam (VALIUM) 2 MG tablet   Oral   Take 2 mg by mouth every 6 (six) hours as needed for anxiety.         Marland Kitchen ezetimibe (ZETIA) 10 MG tablet   Oral   Take 10 mg by mouth daily.         . flecainide (TAMBOCOR) 50 MG tablet   Oral   Take 50 mg by mouth 2 (two) times daily.         . furosemide (LASIX) 20 MG tablet   Oral   Take 20 mg by mouth daily as needed.         Marland Kitchen HYDROcodone-acetaminophen (VICODIN) 5-500 MG per tablet   Oral   Take 1 tablet by mouth every 6 (six) hours as needed for pain.         Marland Kitchen lisinopril (PRINIVIL,ZESTRIL) 20 MG tablet   Oral   Take 20 mg by mouth daily.         . meclizine (ANTIVERT) 25 MG tablet   Oral   Take 25 mg by mouth every 6 (six) hours as needed for dizziness.         Marland Kitchen  metFORMIN (GLUCOPHAGE) 500 MG tablet   Oral   Take 1,000 mg by mouth 2 (two) times daily with a meal.         . mupirocin ointment (BACTROBAN) 2 %   Nasal   Place 1 application into the nose 2 (two) times daily.         . nisoldipine (SULAR) 30 MG 24 hr tablet   Oral   Take 30 mg by mouth daily.         Marland Kitchen omeprazole (PRILOSEC) 20 MG capsule   Oral   Take 20 mg by mouth daily.         . pentosan polysulfate (ELMIRON) 100 MG capsule   Oral   Take 100 mg by mouth daily.         . pregabalin (LYRICA) 75 MG capsule   Oral   Take 75 mg by mouth at bedtime.         . sertraline (ZOLOFT) 50 MG tablet   Oral   Take 50 mg by mouth daily.         . simvastatin (ZOCOR) 40 MG tablet   Oral   Take 40 mg by mouth daily.         Marland Kitchen triamcinolone (NASACORT AQ) 55 MCG/ACT AERO nasal inhaler   Nasal   Place 2 sprays into the nose daily.           Allergies Clarithromycin; Diltiazem hcl; Gabapentin; Interferon beta-1a; Iohexol; Levothyroxine sodium; and Sulfonamide derivatives  No  family history on file.  Social History History  Substance Use Topics  . Smoking status: Never Smoker   . Smokeless tobacco: Not on file  . Alcohol Use: No    Review of Systems  Constitutional: Negative for fever. Eyes: Negative for visual changes. ENT: Positive for nasal bridge injury/aceration. Negative for sore throat. Cardiovascular: Negative for chest pain. Respiratory: Negative for shortness of breath. Gastrointestinal: Negative for abdominal pain, vomiting and diarrhea. Genitourinary: Negative for dysuria. Musculoskeletal: positive for right great toe pain & stiffness. Negative for back pain. Skin: Negative for rash. Neurological: Negative for headaches, focal weakness or numbness.  10-point ROS otherwise negative.  ____________________________________________   PHYSICAL EXAM:  VITAL SIGNS: ED Triage Vitals  Enc Vitals Group     BP 12/06/14 1229 168/91 mmHg     Pulse Rate 12/06/14 1229 90     Resp 12/06/14 1229 18     Temp 12/06/14 1229 99.1 F (37.3 C)     Temp Source 12/06/14 1229 Oral     SpO2 12/06/14 1229 96 %     Weight 12/06/14 1229 149 lb (67.586 kg)     Height 12/06/14 1229 5\' 2"  (1.575 m)     Head Cir --      Peak Flow --      Pain Score 12/06/14 1229 8     Pain Loc --      Pain Edu? --      Excl. in [place]? --     Constitutional: Alert and oriented. Well appearing and in no distress. Eyes: Conjunctivae are normal. PERRL. Normal extraocular movements. ENT   Head: Normocephalic and atraumatic.   Nose: nasal bridge bruising with healing flap laceration. No active bleeding, no deformity. No congestion/rhinnorhea.   Mouth/Throat: Mucous membranes are moist.   Neck: No stridor. Hematological/Lymphatic/Immunilogical: No cervical lymphadenopathy. Cardiovascular: Normal rate, regular rhythm. Normal and symmetric distal pulses are present in all extremities. No murmurs, rubs, or gallops. Respiratory: Normal respiratory effort without  tachypnea nor retractions. Breath sounds are clear and equal bilaterally. No wheezes/rales/rhonchi. Gastrointestinal: Soft and nontender. No distention. No abdominal bruits. There is no CVA tenderness. Genitourinary: deferred. Musculoskeletal: Right great toe with ecchymosis to the base without deformity.  ROM limited by pain. No joint effusions. No lower extremity tenderness nor edema. Neurologic:  Normal speech and language. No gross focal neurologic deficits are appreciated. Speech is normal. No gait instability. Skin:  Skin is warm, dry and intact. No rash noted. Psychiatric: Mood and affect are normal. Speech and behavior are normal. Patient exhibits appropriate insight and judgment.  ____________________________________________    LABS (pertinent positives/negatives)  none  ____________________________________________   EKG  none  ____________________________________________    RADIOLOGY  IMPRESSION: Right Great Toe No acute fracture or subluxation.    IMPRESSION: Nasal Bones Negative. ____________________________________________   PROCEDURES  Procedure(s) performed: None  Critical Care performed: No  ____________________________________________   INITIAL IMPRESSION / ASSESSMENT AND PLAN / ED COURSE  Stable facial contusion.  Nasal laceration, old, no closure required. Toe contusion w/o fracture.    Pertinent labs & imaging results that were available during my care of the patient were reviewed by me and considered in my medical decision making (see chart for details).  ____________________________________________   FINAL CLINICAL IMPRESSION(S) / ED DIAGNOSES  Final diagnoses:  Nasal contusion, initial encounter  Toe contusion, right, initial encounter  Laceration of face with delay in treatment, initial encounter    Melvenia Needles, PA-C 12/06/14 1720

## 2014-12-06 NOTE — ED Notes (Signed)
Pt states she fell 2 days ago and hit her nose, denies any LOC, states she went to have her eyes examined today and would not examine her until she had was seen by a medical Dr. For evaluation of nose

## 2014-12-06 NOTE — ED Notes (Signed)
Pt states she tripped and fell Saturday and hit her nose and having pain to the left great toe,..states she went to have routine eye exam today and was told to have eval to r/o broken nose before they would examine her.

## 2014-12-06 NOTE — Discharge Instructions (Signed)
Contusion A contusion is a deep bruise. Contusions happen when an injury causes bleeding under the skin. Signs of bruising include pain, puffiness (swelling), and discolored skin. The contusion may turn blue, purple, or yellow. HOME CARE   Put ice on the injured area.  Put ice in a plastic bag.  Place a towel between your skin and the bag.  Leave the ice on for 15-20 minutes, 03-04 times a day.  Only take medicine as told by your doctor.  Rest the injured area.  If possible, raise (elevate) the injured area to lessen puffiness. GET HELP RIGHT AWAY IF:   You have more bruising or puffiness.  You have pain that is getting worse.  Your puffiness or pain is not helped by medicine. MAKE SURE YOU:   Understand these instructions.  Will watch your condition.  Will get help right away if you are not doing well or get worse. Document Released: 01/09/2008 Document Revised: 10/15/2011 Document Reviewed: 05/28/2011 [facility] Patient Information 2015 [place], [place]. This information is not intended to replace advice given to you by your health care provider. Make sure you discuss any questions you have with your health care provider.  Ice the toes for comfort.  Wear the cast shoe when walking for support.  Continue your home pain medicine as previously prescribed.

## 2014-12-07 DIAGNOSIS — H469 Unspecified optic neuritis: Secondary | ICD-10-CM | POA: Diagnosis not present

## 2014-12-10 DIAGNOSIS — S92424A Nondisplaced fracture of distal phalanx of right great toe, initial encounter for closed fracture: Secondary | ICD-10-CM | POA: Diagnosis not present

## 2014-12-22 DIAGNOSIS — S0993XD Unspecified injury of face, subsequent encounter: Secondary | ICD-10-CM | POA: Diagnosis not present

## 2014-12-30 DIAGNOSIS — D649 Anemia, unspecified: Secondary | ICD-10-CM | POA: Diagnosis not present

## 2014-12-31 DIAGNOSIS — H209 Unspecified iridocyclitis: Secondary | ICD-10-CM | POA: Diagnosis not present

## 2015-01-06 ENCOUNTER — Emergency Department: Payer: Commercial Managed Care - HMO

## 2015-01-06 ENCOUNTER — Encounter: Payer: Self-pay | Admitting: Emergency Medicine

## 2015-01-06 ENCOUNTER — Emergency Department
Admission: EM | Admit: 2015-01-06 | Discharge: 2015-01-06 | Disposition: A | Discharge status: Home / Self Care | Payer: Commercial Managed Care - HMO | Attending: Student | Admitting: Student

## 2015-01-06 DIAGNOSIS — Z79899 Other long term (current) drug therapy: Secondary | ICD-10-CM | POA: Diagnosis not present

## 2015-01-06 DIAGNOSIS — Y9289 Other specified places as the place of occurrence of the external cause: Secondary | ICD-10-CM | POA: Diagnosis not present

## 2015-01-06 DIAGNOSIS — S199XXA Unspecified injury of neck, initial encounter: Secondary | ICD-10-CM | POA: Diagnosis not present

## 2015-01-06 DIAGNOSIS — S3992XA Unspecified injury of lower back, initial encounter: Secondary | ICD-10-CM | POA: Diagnosis not present

## 2015-01-06 DIAGNOSIS — E119 Type 2 diabetes mellitus without complications: Secondary | ICD-10-CM | POA: Diagnosis not present

## 2015-01-06 DIAGNOSIS — Y9389 Activity, other specified: Secondary | ICD-10-CM | POA: Diagnosis not present

## 2015-01-06 DIAGNOSIS — M62830 Muscle spasm of back: Secondary | ICD-10-CM

## 2015-01-06 DIAGNOSIS — M5489 Other dorsalgia: Secondary | ICD-10-CM | POA: Diagnosis not present

## 2015-01-06 DIAGNOSIS — Y998 Other external cause status: Secondary | ICD-10-CM | POA: Diagnosis not present

## 2015-01-06 DIAGNOSIS — S0990XA Unspecified injury of head, initial encounter: Secondary | ICD-10-CM | POA: Diagnosis not present

## 2015-01-06 DIAGNOSIS — M545 Low back pain: Secondary | ICD-10-CM | POA: Diagnosis not present

## 2015-01-06 DIAGNOSIS — W06XXXA Fall from bed, initial encounter: Secondary | ICD-10-CM | POA: Diagnosis not present

## 2015-01-06 DIAGNOSIS — I10 Essential (primary) hypertension: Secondary | ICD-10-CM | POA: Insufficient documentation

## 2015-01-06 DIAGNOSIS — S299XXA Unspecified injury of thorax, initial encounter: Secondary | ICD-10-CM | POA: Diagnosis not present

## 2015-01-06 DIAGNOSIS — W19XXXA Unspecified fall, initial encounter: Secondary | ICD-10-CM

## 2015-01-06 DIAGNOSIS — R51 Headache: Secondary | ICD-10-CM | POA: Diagnosis not present

## 2015-01-06 LAB — BASIC METABOLIC PANEL
ANION GAP: 6 (ref 5–15)
BUN: 18 mg/dL (ref 6–20)
CHLORIDE: 109 mmol/L (ref 101–111)
CO2: 26 mmol/L (ref 22–32)
CREATININE: 0.95 mg/dL (ref 0.44–1.00)
Calcium: 9.3 mg/dL (ref 8.9–10.3)
GFR calc Af Amer: >60 mL/min (ref >60)
GFR calc non Af Amer: 54 mL/min — ABNORMAL LOW (ref >60)
GLUCOSE: 111 mg/dL — AB (ref 65–99)
Potassium: 4.4 mmol/L (ref 3.5–5.1)
SODIUM: 141 mmol/L (ref 135–145)

## 2015-01-06 LAB — CBC
HCT: 31.5 % — ABNORMAL LOW (ref 35.0–47.0)
HEMOGLOBIN: 9.7 g/dL — AB (ref 12.0–16.0)
MCH: 23.4 pg — AB (ref 26.0–34.0)
MCHC: 31 g/dL — AB (ref 32.0–36.0)
MCV: 75.6 fL — AB (ref 80.0–100.0)
Platelets: 207 10*3/uL (ref 150–440)
RBC: 4.17 MIL/uL (ref 3.80–5.20)
RDW: 17.9 % — ABNORMAL HIGH (ref 11.5–14.5)
WBC: 4.8 10*3/uL (ref 3.6–11.0)

## 2015-01-06 MED ORDER — IBUPROFEN 400 MG PO TABS
ORAL_TABLET | ORAL | Status: AC
  Filled 2015-01-06: qty 1

## 2015-01-06 MED ORDER — ACETAMINOPHEN 500 MG PO TABS
ORAL_TABLET | ORAL | Status: AC
  Filled 2015-01-06: qty 2

## 2015-01-06 MED ORDER — IBUPROFEN 400 MG PO TABS
400.0000 mg | ORAL_TABLET | Freq: Once | ORAL | Status: AC
  Administered 2015-01-06: 400 mg via ORAL

## 2015-01-06 MED ORDER — CYCLOBENZAPRINE HCL 10 MG PO TABS
ORAL_TABLET | ORAL | Status: AC
  Filled 2015-01-06: qty 1

## 2015-01-06 MED ORDER — ACETAMINOPHEN 500 MG PO TABS
1000.0000 mg | ORAL_TABLET | Freq: Once | ORAL | Status: AC
  Administered 2015-01-06: 1000 mg via ORAL

## 2015-01-06 MED ORDER — CYCLOBENZAPRINE HCL 10 MG PO TABS
5.0000 mg | ORAL_TABLET | Freq: Once | ORAL | Status: AC
  Administered 2015-01-06: 5 mg via ORAL

## 2015-01-06 NOTE — ED Notes (Signed)
ccollar removed per er md

## 2015-01-06 NOTE — ED Notes (Signed)
Patient transported to CT 

## 2015-01-06 NOTE — ED Notes (Signed)
Patient presents to the ED with back pain and headache after falling out of bed this morning.  Patient reports multiple falls in the last several months.   Patient states she did hit her head but did not lose consciousness.  Denies taking any blood thinners.  Patient states, "I have MS, so I've been falling for a long time."

## 2015-01-06 NOTE — ED Provider Notes (Signed)
[facility] Emergency Department Provider Note  ____________________________________________  Time seen: Approximately 3:23 PM  I have reviewed the triage vital signs and the nursing notes.   HISTORY  Chief Complaint Fall    HPI [name] is a 81 y.o. female with history of multiple sclerosis, frequent falls, hyperlipidemia, hypertension, and diabetes presents for evaluation of headache, backache, neck pain after fall which occurred suddenly this morning at 2:30 AM. The patient reports that she actually rolled out of bed onto the floor. Her husband helped her back into bed after the fall. She has continued to have back pain that comes in spasms. It is described as severe. It is waxing and waning in nature. It is worsened by movement. She reports she did hit her head when she fell but she is not currently complaining of headache. She denies any recent illness including no cough, sneezing, runny nose, congestion, no chest pain, no difficulty breathing, no lightheadedness. She reports she falls often, walks with rolling walker, lives independently with her husband.   Past Medical History  Diagnosis Date  . GERD (gastroesophageal reflux disease)   . Vertigo   . Essential hypertension, benign   . Paroxysmal supraventricular tachycardia   . Pure hypercholesterolemia   . MS (multiple sclerosis)   . Fibromyalgia   . Diverticulosis   . Diabetes mellitus without complication   . Osteoarthritis   . Dyslipidemia   . SOB (shortness of breath)     SECONDARY TO REFLUX  . Chest pain     SECONDARY TO GERD  . Peripheral neuropathy     MILD    Patient Active Problem List   Diagnosis Date Noted  . DIABETES MELLITUS-TYPE II 06/26/2010  . DEPRESSION 06/26/2010  . HYPERTENSION 06/26/2010  . ESOPHAGEAL STRICTURE 06/26/2010  . GERD 06/26/2010  . INTESTINAL CYSTITIS 06/26/2010  . FIBROMYALGIA 06/26/2010  . PERSONAL HX BREAST CANCER 06/26/2010  . ESSENTIAL  HYPERTENSION, BENIGN 06/01/2009  . PSVT 06/01/2009  . IRRITABLE BOWEL SYNDROME 06/16/2008  . DIARRHEA 06/16/2008  . ABDOMINAL PAIN, LEFT LOWER QUADRANT 06/16/2008    Past Surgical History  Procedure Laterality Date  . Total abdominal hysterectomy w/ bilateral salpingoophorectomy    . Mastectomy Right     DX MASTITIS/NO CANCER.Marland KitchenSALINE IMPLANT  . Cholecystectomy    . Hemorrhoid surgery      WITH RECONSTRUCTION  . Bladder tacking      X 3  . Eye surgeries      WITH BUCKLE DETACHMENT OF THE RETINA  . Tonsillectomy    . Appendectomy    . Tympanoplasty    . Breast surgery      Current Outpatient Rx  Name  Route  Sig  Dispense  Refill  . Cyanocobalamin (VITAMIN B 12 PO)   Oral   Take 1 tablet by mouth daily.         Marland Kitchen dicyclomine (BENTYL) 10 MG capsule   Oral   Take 10 mg by mouth 3 (three) times daily.         . meclizine (ANTIVERT) 25 MG tablet   Oral   Take 25 mg by mouth every 6 (six) hours as needed for dizziness.         . metFORMIN (GLUCOPHAGE) 500 MG tablet   Oral   Take 1,000 mg by mouth 2 (two) times daily with a meal.         . sertraline (ZOLOFT) 50 MG tablet   Oral   Take 50 mg by  mouth daily.         . cetirizine (ZYRTEC) 10 MG tablet   Oral   Take 10 mg by mouth daily.         . diazepam (VALIUM) 2 MG tablet   Oral   Take 2 mg by mouth every 6 (six) hours as needed for anxiety.         . flecainide (TAMBOCOR) 50 MG tablet   Oral   Take 50 mg by mouth 2 (two) times daily.         . furosemide (LASIX) 20 MG tablet   Oral   Take 20 mg by mouth daily as needed.         . mupirocin ointment (BACTROBAN) 2 %   Nasal   Place 1 application into the nose 2 (two) times daily.         . nisoldipine (SULAR) 30 MG 24 hr tablet   Oral   Take 30 mg by mouth daily.         Marland Kitchen omeprazole (PRILOSEC) 20 MG capsule   Oral   Take 20 mg by mouth daily.         . pentosan polysulfate (ELMIRON) 100 MG capsule   Oral   Take 100 mg by  mouth daily.         . pregabalin (LYRICA) 75 MG capsule   Oral   Take 75 mg by mouth at bedtime.         . simvastatin (ZOCOR) 40 MG tablet   Oral   Take 40 mg by mouth daily.         Marland Kitchen triamcinolone (NASACORT AQ) 55 MCG/ACT AERO nasal inhaler   Nasal   Place 2 sprays into the nose daily.           Allergies Decadron; Diltiazem hcl; Flagyl; Gabapentin; Interferon beta-1a; Ivp dye; and Sulfonamide derivatives  No family history on file.  Social History History  Substance Use Topics  . Smoking status: Never Smoker   . Smokeless tobacco: Never Used  . Alcohol Use: No    Review of Systems Constitutional: No fever/chills Eyes: No visual changes. ENT: No sore throat. Cardiovascular: Denies chest pain. Respiratory: Denies shortness of breath. Gastrointestinal: No abdominal pain.  No nausea, no vomiting.  No diarrhea.  No constipation. Genitourinary: Negative for dysuria. Musculoskeletal: + for back pain. Skin: Negative for rash. Neurological: Negative for headaches, focal weakness or numbness.  10-point ROS otherwise negative.  ____________________________________________   PHYSICAL EXAM:  VITAL SIGNS: ED Triage Vitals  Enc Vitals Group     BP --      Pulse Rate 01/06/15 1208 78     Resp --      Temp 01/06/15 1208 99.8 F (37.7 C)     Temp Source 01/06/15 1208 Oral     SpO2 01/06/15 1208 98 %     Weight 01/06/15 1208 149 lb (67.586 kg)     Height 01/06/15 1208 5\' 2"  (1.575 m)     Head Cir --      Peak Flow --      Pain Score 01/06/15 1209 8     Pain Loc --      Pain Edu? --      Excl. in [place]? --     Constitutional: Alert and oriented. In mild distress secondary to pain, she appears to have waves of pain which spontaneously resolved and then recur Eyes: Conjunctivae are normal. PERRL. EOMI. Head: Atraumatic. Nose: No  congestion/rhinnorhea. Mouth/Throat: Mucous membranes are moist.  Oropharynx non-erythematous. Neck: No stridor. Mild c-spine and  paravertebral muscle tenderness. Cardiovascular: Normal rate, regular rhythm. Grossly normal heart sounds.  Good peripheral circulation. Respiratory: Normal respiratory effort.  No retractions. Lungs CTAB. Gastrointestinal: Soft and nontender. No distention. No abdominal bruits. No CVA tenderness. Genitourinary: deferred Musculoskeletal: No lower extremity tenderness nor edema.  No joint effusions. Mild midline and bilateral paravertebral muscle tenderness throughout the T and L-spine, mild tenderness to palpation throughout the right posterolateral chest wall. Pelvis is stable to rock and compression, full painless range of motion of bilateral hips Neurologic:  Normal speech and language. No gross focal neurologic deficits are appreciated. Speech is normal. No gait instability. Skin:  Skin is warm, dry and intact. No rash noted. Psychiatric: Mood and affect are normal. Speech and behavior are normal.  ____________________________________________   LABS (all labs ordered are listed, but only abnormal results are displayed)  Labs Reviewed  BASIC METABOLIC PANEL - Abnormal; Notable for the following:    Glucose, Bld 111 (*)    GFR calc non Af Amer 54 (*)    All other components within normal limits  CBC - Abnormal; Notable for the following:    Hemoglobin 9.7 (*)    HCT 31.5 (*)    MCV 75.6 (*)    MCH 23.4 (*)    MCHC 31.0 (*)    RDW 17.9 (*)    All other components within normal limits   ____________________________________________  EKG  ED ECG REPORT I, Joanne Gavel, the attending physician, personally viewed and interpreted this ECG.   Date: 01/06/2015  EKG Time: 12:21  Rate: 77  Rhythm: normal EKG, normal sinus rhythm  Axis: normal  Intervals:none, normal intervals  ST&T Change: No acute ST segment change  ____________________________________________  RADIOLOGY  CT head without contrast IMPRESSION: No acute intracranial abnormality.  Stable atrophy and evidence  for chronic small vessel ischemic Changes.  CT cervical spine IMPRESSION: 1. No acute cervical spine findings. Prominent cervical spondylosis and degenerative disc disease, as noted on priors. 2. Similar appearance of prior left mastoidectomy with fluid material or debris at the mastoidectomy bed and to a lesser extent in the left middle ear. 3. Chronic right maxillary sinusitis.  CXR IMPRESSION: 1. Mild lung hyperexpansion and bronchitic change without acute cardiopulmonary disease. 2. Stigmata of DISH within the thoracic spine. Please refer to dedicated thoracic spine radiographs performed earlier same day.  Xray thoracic spine IMPRESSION: 1. No evidence of fracture or subluxation along the thoracic spine. 2. Mild degenerative change at the mid cervical spine.   Xray Lumbar spine IMPRESSION: Stable alignment and spondylosis status post L4-5 fusion. No acute osseous findings demonstrated. ____________________________________________   PROCEDURES  Procedure(s) performed: None  Critical Care performed: No  ____________________________________________   INITIAL IMPRESSION / ASSESSMENT AND PLAN / ED COURSE  Pertinent labs & imaging results that were available during my care of the patient were reviewed by me and considered in my medical decision making (see chart for details).  [name] is a 81 y.o. female with history of multiple sclerosis, frequent falls, hyperlipidemia, hypertension, and diabetes presents for evaluation of headache, backache, neck pain after fall. C-collar applied on arrival to room 17 in the ER. Vital signs stable and she is afebrile. She is in mild distress secondary to pain and appears to be having spasms. She has mild tenderness throughout the neck and back as well as the posterior lateral chest wall so we'll obtain  plain films of the T/L-spine, chest x-ray as well as CT of the C-spine. CT head negative for any acute intracranial pathology.  We'll treat her pain. Reassess for disposition.  ----------------------------------------- 6:48 PM on 01/06/2015 -----------------------------------------  Labs reviewed and hemoglobin is at baseline. Labs otherwise unremarkable. CT C-spine negative. C-collar cleared. Plain films negative for any acute traumatic pathology. Patient appears well. She is sitting up in bed, requesting discharge. She still has mild pain but her pain is significantly improved. Will DC with return precautions and close PCP follow-up. She refused my offer to help with home assistance/care resources and her husband is her caregiver. ____________________________________________   FINAL CLINICAL IMPRESSION(S) / ED DIAGNOSES  Final diagnoses:  Fall, initial encounter  Back muscle spasm      Joanne Gavel, MD 01/06/15 [phone]

## 2015-01-07 ENCOUNTER — Telehealth: Payer: Self-pay | Admitting: Family Medicine

## 2015-01-07 DIAGNOSIS — H1820 Unspecified corneal edema: Secondary | ICD-10-CM | POA: Diagnosis not present

## 2015-01-07 NOTE — Telephone Encounter (Signed)
Pt is requesting a referral to [facility]. She has appointment scheduled for 04-07-15 @ 1100a with Dr Arelia Sneddon

## 2015-01-09 ENCOUNTER — Emergency Department: Payer: Commercial Managed Care - HMO

## 2015-01-09 ENCOUNTER — Emergency Department
Admission: EM | Admit: 2015-01-09 | Discharge: 2015-01-09 | Disposition: A | Discharge status: Home / Self Care | Payer: Commercial Managed Care - HMO | Attending: Emergency Medicine | Admitting: Emergency Medicine

## 2015-01-09 ENCOUNTER — Encounter: Payer: Self-pay | Admitting: Emergency Medicine

## 2015-01-09 DIAGNOSIS — Z79899 Other long term (current) drug therapy: Secondary | ICD-10-CM | POA: Diagnosis not present

## 2015-01-09 DIAGNOSIS — S299XXA Unspecified injury of thorax, initial encounter: Secondary | ICD-10-CM | POA: Diagnosis not present

## 2015-01-09 DIAGNOSIS — T148 Other injury of unspecified body region: Secondary | ICD-10-CM | POA: Insufficient documentation

## 2015-01-09 DIAGNOSIS — R296 Repeated falls: Secondary | ICD-10-CM | POA: Diagnosis not present

## 2015-01-09 DIAGNOSIS — S20211A Contusion of right front wall of thorax, initial encounter: Secondary | ICD-10-CM | POA: Diagnosis not present

## 2015-01-09 DIAGNOSIS — Z792 Long term (current) use of antibiotics: Secondary | ICD-10-CM | POA: Diagnosis not present

## 2015-01-09 DIAGNOSIS — Y998 Other external cause status: Secondary | ICD-10-CM | POA: Insufficient documentation

## 2015-01-09 DIAGNOSIS — R0781 Pleurodynia: Secondary | ICD-10-CM | POA: Diagnosis not present

## 2015-01-09 DIAGNOSIS — S0990XA Unspecified injury of head, initial encounter: Secondary | ICD-10-CM | POA: Diagnosis not present

## 2015-01-09 DIAGNOSIS — I10 Essential (primary) hypertension: Secondary | ICD-10-CM | POA: Insufficient documentation

## 2015-01-09 DIAGNOSIS — M79641 Pain in right hand: Secondary | ICD-10-CM | POA: Diagnosis not present

## 2015-01-09 DIAGNOSIS — W19XXXA Unspecified fall, initial encounter: Secondary | ICD-10-CM

## 2015-01-09 DIAGNOSIS — Y9289 Other specified places as the place of occurrence of the external cause: Secondary | ICD-10-CM | POA: Diagnosis not present

## 2015-01-09 DIAGNOSIS — Y9389 Activity, other specified: Secondary | ICD-10-CM | POA: Diagnosis not present

## 2015-01-09 DIAGNOSIS — W01198A Fall on same level from slipping, tripping and stumbling with subsequent striking against other object, initial encounter: Secondary | ICD-10-CM | POA: Diagnosis not present

## 2015-01-09 DIAGNOSIS — S6991XA Unspecified injury of right wrist, hand and finger(s), initial encounter: Secondary | ICD-10-CM | POA: Insufficient documentation

## 2015-01-09 DIAGNOSIS — S20229A Contusion of unspecified back wall of thorax, initial encounter: Secondary | ICD-10-CM | POA: Diagnosis not present

## 2015-01-09 DIAGNOSIS — E119 Type 2 diabetes mellitus without complications: Secondary | ICD-10-CM | POA: Insufficient documentation

## 2015-01-09 DIAGNOSIS — T148XXA Other injury of unspecified body region, initial encounter: Secondary | ICD-10-CM

## 2015-01-09 DIAGNOSIS — S3992XA Unspecified injury of lower back, initial encounter: Secondary | ICD-10-CM | POA: Diagnosis present

## 2015-01-09 MED ORDER — ACETAMINOPHEN 325 MG PO TABS
650.0000 mg | ORAL_TABLET | Freq: Once | ORAL | Status: AC
  Administered 2015-01-09: 650 mg via ORAL

## 2015-01-09 MED ORDER — ACETAMINOPHEN 325 MG PO TABS
ORAL_TABLET | ORAL | Status: AC
  Administered 2015-01-09: 650 mg via ORAL
  Filled 2015-01-09: qty 2

## 2015-01-09 NOTE — ED Notes (Signed)
Patient presents to the ED post fall.  Patient states she fell as she was walking into her bathroom.  Patient states there was water on the floor.  Patient states her back hit the floor and her head hit a hospital basin that was on the floor.  Patient is complaining of tenderness to the right side of her head, back pain and right hand pain.

## 2015-01-09 NOTE — Discharge Instructions (Signed)
Please seek medical attention for any high fevers, chest pain, shortness of breath, change in behavior, persistent vomiting, bloody stool or any other new or concerning symptoms.  Contusion A contusion is a deep bruise. Contusions are the result of an injury that caused bleeding under the skin. The contusion may turn blue, purple, or yellow. Minor injuries will give you a painless contusion, but more severe contusions may stay painful and swollen for a few weeks.  CAUSES  A contusion is usually caused by a blow, trauma, or direct force to an area of the body. SYMPTOMS   Swelling and redness of the injured area.  Bruising of the injured area.  Tenderness and soreness of the injured area.  Pain. DIAGNOSIS  The diagnosis can be made by taking a history and physical exam. An X-ray, CT scan, or MRI may be needed to determine if there were any associated injuries, such as fractures. TREATMENT  Specific treatment will depend on what area of the body was injured. In general, the best treatment for a contusion is resting, icing, elevating, and applying cold compresses to the injured area. Over-the-counter medicines may also be recommended for pain control. Ask your caregiver what the best treatment is for your contusion. HOME CARE INSTRUCTIONS   Put ice on the injured area.  Put ice in a plastic bag.  Place a towel between your skin and the bag.  Leave the ice on for 15-20 minutes, 3-4 times a day, or as directed by your health care provider.  Only take over-the-counter or prescription medicines for pain, discomfort, or fever as directed by your caregiver. Your caregiver may recommend avoiding anti-inflammatory medicines (aspirin, ibuprofen, and naproxen) for 48 hours because these medicines may increase bruising.  Rest the injured area.  If possible, elevate the injured area to reduce swelling. SEEK IMMEDIATE MEDICAL CARE IF:   You have increased bruising or swelling.  You have pain  that is getting worse.  Your swelling or pain is not relieved with medicines. MAKE SURE YOU:   Understand these instructions.  Will watch your condition.  Will get help right away if you are not doing well or get worse. Document Released: 05/02/2005 Document Revised: 07/28/2013 Document Reviewed: 05/28/2011 [facility] Patient Information 2015 [place], [place]. This information is not intended to replace advice given to you by your health care provider. Make sure you discuss any questions you have with your health care provider.

## 2015-01-09 NOTE — ED Provider Notes (Signed)
[facility] Emergency Department Provider Note    ____________________________________________  Time seen: 1815  I have reviewed the triage vital signs and the nursing notes.   HISTORY  Chief Complaint Fall   History limited by: Not Limited   HPI [name] is a 81 y.o. female resents to the emergency department today after a fall. Patient states she was in the bathroom when she slipped. She fell backwards hitting her back and her head on a basin. Complaining of some back and right-sided chest pain. Patient states she has a history of MS and subsequent falls. Has been falling more of late. Was seen 3days ago in the emergency department for a fall. denies any initial chest pain, shortness breath, vomiting or diarrhea.     Past Medical History  Diagnosis Date  . GERD (gastroesophageal reflux disease)   . Vertigo   . Essential hypertension, benign   . Paroxysmal supraventricular tachycardia   . Pure hypercholesterolemia   . MS (multiple sclerosis)   . Fibromyalgia   . Diverticulosis   . Diabetes mellitus without complication   . Osteoarthritis   . Dyslipidemia   . SOB (shortness of breath)     SECONDARY TO REFLUX  . Chest pain     SECONDARY TO GERD  . Peripheral neuropathy     MILD  . Cancer     Patient Active Problem List   Diagnosis Date Noted  . DIABETES MELLITUS-TYPE II 06/26/2010  . DEPRESSION 06/26/2010  . HYPERTENSION 06/26/2010  . ESOPHAGEAL STRICTURE 06/26/2010  . GERD 06/26/2010  . INTESTINAL CYSTITIS 06/26/2010  . FIBROMYALGIA 06/26/2010  . PERSONAL HX BREAST CANCER 06/26/2010  . ESSENTIAL HYPERTENSION, BENIGN 06/01/2009  . PSVT 06/01/2009  . IRRITABLE BOWEL SYNDROME 06/16/2008  . DIARRHEA 06/16/2008  . ABDOMINAL PAIN, LEFT LOWER QUADRANT 06/16/2008    Past Surgical History  Procedure Laterality Date  . Total abdominal hysterectomy w/ bilateral salpingoophorectomy    . Mastectomy Right     DX MASTITIS/NO  CANCER.Marland KitchenSALINE IMPLANT  . Cholecystectomy    . Hemorrhoid surgery      WITH RECONSTRUCTION  . Bladder tacking      X 3  . Eye surgeries      WITH BUCKLE DETACHMENT OF THE RETINA  . Tonsillectomy    . Appendectomy    . Tympanoplasty    . Breast surgery    . Mastectomy    . Breast lumpectomy      Current Outpatient Rx  Name  Route  Sig  Dispense  Refill  . cetirizine (ZYRTEC) 10 MG tablet   Oral   Take 10 mg by mouth daily.         . Cyanocobalamin (VITAMIN B 12 PO)   Oral   Take 1 tablet by mouth daily.         . diazepam (VALIUM) 2 MG tablet   Oral   Take 2 mg by mouth every 6 (six) hours as needed for anxiety.         . dicyclomine (BENTYL) 10 MG capsule   Oral   Take 10 mg by mouth 3 (three) times daily.         . flecainide (TAMBOCOR) 50 MG tablet   Oral   Take 50 mg by mouth 2 (two) times daily.         . furosemide (LASIX) 20 MG tablet   Oral   Take 20 mg by mouth daily as needed.         Marland Kitchen  meclizine (ANTIVERT) 25 MG tablet   Oral   Take 25 mg by mouth every 6 (six) hours as needed for dizziness.         . metFORMIN (GLUCOPHAGE) 500 MG tablet   Oral   Take 1,000 mg by mouth 2 (two) times daily with a meal.         . mupirocin ointment (BACTROBAN) 2 %   Nasal   Place 1 application into the nose 2 (two) times daily.         . nisoldipine (SULAR) 30 MG 24 hr tablet   Oral   Take 30 mg by mouth daily.         Marland Kitchen omeprazole (PRILOSEC) 20 MG capsule   Oral   Take 20 mg by mouth daily.         . pentosan polysulfate (ELMIRON) 100 MG capsule   Oral   Take 100 mg by mouth daily.         . pregabalin (LYRICA) 75 MG capsule   Oral   Take 75 mg by mouth at bedtime.         . sertraline (ZOLOFT) 50 MG tablet   Oral   Take 50 mg by mouth daily.         . simvastatin (ZOCOR) 40 MG tablet   Oral   Take 40 mg by mouth daily.         Marland Kitchen triamcinolone (NASACORT AQ) 55 MCG/ACT AERO nasal inhaler   Nasal   Place 2 sprays into  the nose daily.           Allergies Decadron; Diltiazem hcl; Flagyl; Gabapentin; Interferon beta-1a; Ivp dye; and Sulfonamide derivatives  No family history on file.  Social History History  Substance Use Topics  . Smoking status: Never Smoker   . Smokeless tobacco: Never Used  . Alcohol Use: No    Review of Systems  Constitutional: Negative for fever. Cardiovascular: Negative for chest pain. Respiratory: Negative for shortness of breath. Gastrointestinal: Negative for abdominal pain, vomiting and diarrhea. Genitourinary: Negative for dysuria. Musculoskeletal: positive for back pain Skin: Negative for rash. Neurological: Negative for headaches, focal weakness or numbness.   10-point ROS otherwise negative.  ____________________________________________   PHYSICAL EXAM:  VITAL SIGNS: ED Triage Vitals  Enc Vitals Group     BP 01/09/15 1429 124/94 mmHg     Pulse Rate 01/09/15 1429 81     Resp 01/09/15 1429 20     Temp 01/09/15 1429 98.5 F (36.9 C)     Temp Source 01/09/15 1429 Oral     SpO2 01/09/15 1429 97 %     Weight 01/09/15 1429 149 lb (67.586 kg)     Height 01/09/15 1429 5\' 2"  (1.575 m)     Head Cir --      Peak Flow --      Pain Score 01/09/15 1429 5   Constitutional: Alert and oriented. Well appearing and in no distress. Eyes: Conjunctivae are normal. PERRL. Normal extraocular movements. ENT   Head: Normocephalic and atraumatic.no hemotympanum   Nose: No congestion/rhinnorhea.   Mouth/Throat: Mucous membranes are moist.   Neck: No stridor.no midline tenderness. Painless range of motion. Hematological/Lymphatic/Immunilogical: No cervical lymphadenopathy. Cardiovascular: Normal rate, regular rhythm.  No murmurs, rubs, or gallops. Respiratory: Normal respiratory effort without tachypnea nor retractions. Breath sounds are clear and equal bilaterally. No wheezes/rales/rhonchi.tender to palpation of the right chest wall. Gastrointestinal:  Soft and nontender. No distention. Genitourinary: Deferred Musculoskeletal: Normal range of  motion in all extremities. No joint effusions.  No lower extremity tenderness nor edema.tender to palpation of the thoracic spine. Neurologic:  Normal speech and language. No gross focal neurologic deficits are appreciated. Speech is normal.  Skin:  Skin is warm, dry and intact. No rash noted. Psychiatric: Mood and affect are normal. Speech and behavior are normal. Patient exhibits appropriate insight and judgment.  ____________________________________________    LABS (pertinent positives/negatives)  None  ____________________________________________   EKG  None  ____________________________________________    RADIOLOGY  CT head IMPRESSION: 1. No acute intracranial abnormality or significant interval change. 2. Stable atrophy and white matter disease.  Right hand x-ray  IMPRESSION: 1. No acute abnormality. 2. Moderate degenerative changes of the hand.  CT chest  IMPRESSION: 1. No acute pulmonary findings. 2. No definite rib, sternal or vertebral body fractures. ____________________________________________   PROCEDURES  Procedure(s) performed: None  Critical Care performed: No  ____________________________________________   INITIAL IMPRESSION / ASSESSMENT AND PLAN / ED COURSE  Pertinent labs & imaging results that were available during my care of the patient were reviewed by me and considered in my medical decision making (see chart for details).  Patient presents to the emergency department after a mechanical fall. Patient fell backwards. Complaining of pain in her upper back, right side of her chest. She did not have loss of consciousness. CT head, CT chest and right hand x-ray without any acute fractures or dislocations. Will order home health PT.  ____________________________________________   FINAL CLINICAL IMPRESSION(S) / ED DIAGNOSES  Final diagnoses:   Contusion  Fall, initial encounter     Nance Pear, MD 01/09/15 1956

## 2015-01-09 NOTE — ED Notes (Signed)
Pt c/o back from recent fall.  No distress noted.

## 2015-01-11 DIAGNOSIS — E041 Nontoxic single thyroid nodule: Secondary | ICD-10-CM | POA: Diagnosis not present

## 2015-01-11 NOTE — Telephone Encounter (Signed)
[employer] referral in [employer] portal  Dr. Arelia Sneddon Auth # [id number] 04/07/15-09/24/15

## 2015-01-17 DIAGNOSIS — R1314 Dysphagia, pharyngoesophageal phase: Secondary | ICD-10-CM | POA: Diagnosis not present

## 2015-01-17 DIAGNOSIS — D500 Iron deficiency anemia secondary to blood loss (chronic): Secondary | ICD-10-CM | POA: Diagnosis not present

## 2015-01-20 ENCOUNTER — Encounter: Payer: Self-pay | Admitting: *Deleted

## 2015-01-20 DIAGNOSIS — E041 Nontoxic single thyroid nodule: Secondary | ICD-10-CM | POA: Diagnosis not present

## 2015-01-21 ENCOUNTER — Ambulatory Visit: Payer: Commercial Managed Care - HMO | Admitting: Anesthesiology

## 2015-01-21 ENCOUNTER — Encounter: Payer: Self-pay | Admitting: *Deleted

## 2015-01-21 ENCOUNTER — Encounter
Admission: RE | Disposition: A | Discharge status: Home / Self Care | Payer: Self-pay | Source: Ambulatory Visit | Attending: Gastroenterology

## 2015-01-21 ENCOUNTER — Ambulatory Visit
Admission: RE | Admit: 2015-01-21 | Discharge: 2015-01-21 | Disposition: A | Discharge status: Home / Self Care | Payer: Commercial Managed Care - HMO | Source: Ambulatory Visit | Attending: Gastroenterology | Admitting: Gastroenterology

## 2015-01-21 DIAGNOSIS — K5730 Diverticulosis of large intestine without perforation or abscess without bleeding: Secondary | ICD-10-CM | POA: Insufficient documentation

## 2015-01-21 DIAGNOSIS — K219 Gastro-esophageal reflux disease without esophagitis: Secondary | ICD-10-CM | POA: Diagnosis not present

## 2015-01-21 DIAGNOSIS — Z79899 Other long term (current) drug therapy: Secondary | ICD-10-CM | POA: Insufficient documentation

## 2015-01-21 DIAGNOSIS — M1990 Unspecified osteoarthritis, unspecified site: Secondary | ICD-10-CM | POA: Insufficient documentation

## 2015-01-21 DIAGNOSIS — Z8673 Personal history of transient ischemic attack (TIA), and cerebral infarction without residual deficits: Secondary | ICD-10-CM | POA: Diagnosis not present

## 2015-01-21 DIAGNOSIS — E119 Type 2 diabetes mellitus without complications: Secondary | ICD-10-CM | POA: Insufficient documentation

## 2015-01-21 DIAGNOSIS — K644 Residual hemorrhoidal skin tags: Secondary | ICD-10-CM | POA: Diagnosis not present

## 2015-01-21 DIAGNOSIS — E780 Pure hypercholesterolemia: Secondary | ICD-10-CM | POA: Diagnosis not present

## 2015-01-21 DIAGNOSIS — G35 Multiple sclerosis: Secondary | ICD-10-CM | POA: Diagnosis not present

## 2015-01-21 DIAGNOSIS — R1310 Dysphagia, unspecified: Secondary | ICD-10-CM | POA: Insufficient documentation

## 2015-01-21 DIAGNOSIS — D131 Benign neoplasm of stomach: Secondary | ICD-10-CM | POA: Diagnosis not present

## 2015-01-21 DIAGNOSIS — D509 Iron deficiency anemia, unspecified: Secondary | ICD-10-CM | POA: Insufficient documentation

## 2015-01-21 DIAGNOSIS — K317 Polyp of stomach and duodenum: Secondary | ICD-10-CM | POA: Insufficient documentation

## 2015-01-21 DIAGNOSIS — I10 Essential (primary) hypertension: Secondary | ICD-10-CM | POA: Insufficient documentation

## 2015-01-21 DIAGNOSIS — K222 Esophageal obstruction: Secondary | ICD-10-CM | POA: Diagnosis not present

## 2015-01-21 DIAGNOSIS — R42 Dizziness and giddiness: Secondary | ICD-10-CM | POA: Diagnosis not present

## 2015-01-21 DIAGNOSIS — F418 Other specified anxiety disorders: Secondary | ICD-10-CM | POA: Insufficient documentation

## 2015-01-21 DIAGNOSIS — D500 Iron deficiency anemia secondary to blood loss (chronic): Secondary | ICD-10-CM | POA: Diagnosis not present

## 2015-01-21 DIAGNOSIS — M797 Fibromyalgia: Secondary | ICD-10-CM | POA: Diagnosis not present

## 2015-01-21 HISTORY — PX: COLONOSCOPY WITH PROPOFOL: SHX5780

## 2015-01-21 HISTORY — DX: Major depressive disorder, single episode, unspecified: F32.9

## 2015-01-21 HISTORY — DX: Depression, unspecified: F32.A

## 2015-01-21 HISTORY — PX: ESOPHAGOGASTRODUODENOSCOPY: SHX5428

## 2015-01-21 HISTORY — DX: Interstitial cystitis (chronic) without hematuria: N30.10

## 2015-01-21 LAB — GLUCOSE, CAPILLARY: Glucose-Capillary: 104 mg/dL — ABNORMAL HIGH (ref 65–99)

## 2015-01-21 SURGERY — COLONOSCOPY WITH PROPOFOL
Anesthesia: General

## 2015-01-21 MED ORDER — PROPOFOL 10 MG/ML IV BOLUS
INTRAVENOUS | Status: DC | PRN
  Administered 2015-01-21: 30 mg via INTRAVENOUS

## 2015-01-21 MED ORDER — MIDAZOLAM HCL 2 MG/2ML IJ SOLN
INTRAMUSCULAR | Status: DC | PRN
  Administered 2015-01-21: 1 mg via INTRAVENOUS

## 2015-01-21 MED ORDER — FENTANYL CITRATE (PF) 100 MCG/2ML IJ SOLN
INTRAMUSCULAR | Status: DC | PRN
  Administered 2015-01-21: 50 ug via INTRAVENOUS

## 2015-01-21 MED ORDER — SODIUM CHLORIDE 0.9 % IV SOLN
INTRAVENOUS | Status: DC
  Administered 2015-01-21: 09:00:00 via INTRAVENOUS

## 2015-01-21 MED ORDER — LIDOCAINE HCL (CARDIAC) 20 MG/ML IV SOLN
INTRAVENOUS | Status: DC | PRN
  Administered 2015-01-21: 40 mg via INTRAVENOUS

## 2015-01-21 MED ORDER — PROPOFOL INFUSION 10 MG/ML OPTIME
INTRAVENOUS | Status: DC | PRN
  Administered 2015-01-21: 50 ug/kg/min via INTRAVENOUS

## 2015-01-21 MED ORDER — FENTANYL CITRATE (PF) 100 MCG/2ML IJ SOLN
INTRAMUSCULAR | Status: AC
  Administered 2015-01-21: 50 ug
  Filled 2015-01-21: qty 2

## 2015-01-21 MED ORDER — ONDANSETRON HCL 4 MG/2ML IJ SOLN
INTRAMUSCULAR | Status: DC | PRN
  Administered 2015-01-21: 4 mg via INTRAVENOUS

## 2015-01-21 NOTE — Anesthesia Preprocedure Evaluation (Signed)
Anesthesia Evaluation  Patient identified by MRN, date of birth, ID band Patient awake    Reviewed: Allergy & Precautions, NPO status , Patient's Chart, lab work & pertinent test results  History of Anesthesia Complications (+) PONV  Airway Mallampati: II  TM Distance: >3 FB Neck ROM: Full    Dental  (+) Upper Dentures, Edentulous Lower   Pulmonary          Cardiovascular hypertension, + dysrhythmias Supra Ventricular Tachycardia     Neuro/Psych Anxiety Depression TIA (r eye couldn't see) Neuromuscular disease (multiple scleorosis, weakness, falling)    GI/Hepatic GERD-  Medicated,  Endo/Other  diabetes, Type 2, Oral Hypoglycemic Agents  Renal/GU Renal disease (interstitila cystitis)     Musculoskeletal  (+) Arthritis -, Osteoarthritis,  Fibromyalgia -  Abdominal   Peds  Hematology   Anesthesia Other Findings   Reproductive/Obstetrics                             Anesthesia Physical Anesthesia Plan  ASA: III  Anesthesia Plan: General   Post-op Pain Management:    Induction: Intravenous  Airway Management Planned: Nasal Cannula  Additional Equipment:   Intra-op Plan:   Post-operative Plan:   Informed Consent: I have reviewed the patients History and Physical, chart, labs and discussed the procedure including the risks, benefits and alternatives for the proposed anesthesia with the patient or authorized representative who has indicated his/her understanding and acceptance.     Plan Discussed with:   Anesthesia Plan Comments:         Anesthesia Quick Evaluation

## 2015-01-21 NOTE — Op Note (Signed)
[facility] Gastroenterology Patient Name: [name] Procedure Date: 01/21/2015 9:11 AM MRN: [id number] Account #: [id number] Date of Birth: 02/02/1930 Admit Type: Outpatient Age: 81 Room: [facility] ENDO ROOM 2 Gender: Female Note Status: Finalized Procedure:         Colonoscopy Indications:       Iron deficiency anemia Patient Profile:   This is an 81 year old female. Providers:         Gerrit Heck. Rayann Heman, MD Referring MD:      Dr Kae Heller Medicines:         Propofol per Anesthesia Complications:     No immediate complications. Procedure:         Pre-Anesthesia Assessment:                    - Prior to the procedure, a History and Physical was                     performed, and patient medications, allergies and                     sensitivities were reviewed. The patient's tolerance of                     previous anesthesia was reviewed.                    After obtaining informed consent, the colonoscope was                     passed under direct vision. Throughout the procedure, the                     patient's blood pressure, pulse, and oxygen saturations                     were monitored continuously. The Olympus CF-140L                     colonoscope (S#: [id number]) was introduced through the anus                     and advanced to the the terminal ileum. The colonoscopy                     was somewhat difficult due to multiple diverticula in the                     colon and restricted mobility of the colon. Successful                     completion of the procedure was aided by using manual                     pressure. The patient tolerated the procedure well. The                     quality of the bowel preparation was good. Findings:      The terminal ileum appeared normal.      The perianal exam findings include non-thrombosed external hemorrhoids.      Many small and large-mouthed diverticula were found in the sigmoid colon.      A few small and  large-mouthed diverticula were found in the proximal       transverse colon, at the  hepatic flexure and in the ascending colon.      - Tatoo at 15 cm. No abnormal mucosa seen near tatoo.      The exam was otherwise without abnormality on direct and retroflexion       views. Impression:        - Non-thrombosed external hemorrhoids found on perianal                     exam.                    - Diverticulosis in the sigmoid colon.                    - Diverticulosis in the proximal transverse colon, at the                     hepatic flexure and in the ascending colon.                    - Tatoo at 15 cm.                    - The examination was otherwise normal on direct and                     retroflexion views.                    - No specimens collected. Recommendation:    - Observe patient in GI recovery unit.                    - Resume regular diet.                    - Continue present medications.                    - Return to referring physician.                    - The findings and recommendations were discussed with the                     patient.                    - The findings and recommendations were discussed with the                     patient's family. Procedure Code(s): --- Professional ---                    [phone], Colonoscopy, flexible; diagnostic, including                     collection of specimen(s) by brushing or washing, when                     performed (separate procedure) CPT copyright 2014 American Medical Association. All rights reserved. The codes documented in this report are preliminary and upon coder review may  be revised to meet current compliance requirements. Mellody Life, MD 01/21/2015 10:03:09 AM This report has been signed electronically. Number of Addenda: 0 Note Initiated On: 01/21/2015 9:11 AM Scope Withdrawal Time: 0 hours 10 minutes 17 seconds  Total Procedure Duration: 0 hours 19 minutes 34 seconds       [facility]  [facility]

## 2015-01-21 NOTE — Transfer of Care (Signed)
Immediate Anesthesia Transfer of Care Note  Patient: [name]  Procedure(s) Performed: Procedure(s): COLONOSCOPY WITH PROPOFOL (N/A) ESOPHAGOGASTRODUODENOSCOPY (EGD) (N/A)  Patient Location: Endoscopy Unit  Anesthesia Type:General  Level of Consciousness: awake, alert , oriented and patient cooperative  Airway & Oxygen Therapy: Patient Spontanous Breathing and Patient connected to nasal cannula oxygen  Post-op Assessment: Report given to RN, Post -op Vital signs reviewed and stable and Patient moving all extremities X 4  Post vital signs: Reviewed and stable  Last Vitals:  Filed Vitals:   01/21/15 1012  BP: 166/74  Pulse: 74  Temp: 35.6 C  Resp: 13    Complications: No apparent anesthesia complications

## 2015-01-21 NOTE — Op Note (Signed)
[facility] Gastroenterology Patient Name: [name] Procedure Date: 01/21/2015 9:14 AM MRN: [id number] Account #: [id number] Date of Birth: Dec 17, 1929 Admit Type: Outpatient Age: 81 Room: [facility] ENDO ROOM 2 Gender: Female Note Status: Finalized Procedure:         Upper GI endoscopy Indications:       Iron deficiency anemia, Dysphagia Patient Profile:   This is an 81 year old female. Providers:         Gerrit Heck. Rayann Heman, MD Referring MD:      Dr Kae Heller Medicines:         Propofol per Anesthesia Complications:     No immediate complications. Procedure:         Pre-Anesthesia Assessment:                    - Prior to the procedure, a History and Physical was                     performed, and patient medications, allergies and                     sensitivities were reviewed. The patient's tolerance of                     previous anesthesia was reviewed.                    After obtaining informed consent, the endoscope was passed                     under direct vision. Throughout the procedure, the                     patient's blood pressure, pulse, and oxygen saturations                     were monitored continuously. The Olympus GIF-160 endoscope                     (S#. [id number]) was introduced through the mouth, and                     advanced to the second part of duodenum. The upper GI                     endoscopy was accomplished without difficulty. The patient                     tolerated the procedure well. Findings:      A widely patent Schatzki ring (acquired) was found at the       gastroesophageal junction. A TTS dilator was passed through the scope.       Dilation with a 15-16.5-18 mm x 5.5 cm CRE balloon (to a maximum balloon       size of 18 mm) dilator was performed. No heme production at 16.5. Mild       heme produuction at 18 mm      Multiple 2 to 4 mm sessile polyps were found in the gastric body.       Biopsies were taken with a cold  forceps for histology.      The examined duodenum was normal. Impression:        - Widely patent Schatzki ring. Dilated.                    -  Multiple gastric polyps. Biopsied.                    - Normal examined duodenum. Recommendation:    - Perform a colonoscopy today.                    - Await pathology results.                    - Repeat the upper endoscopy PRN for retreatment.                    - The findings and recommendations were discussed with the                     patient.                    - The findings and recommendations were discussed with the                     patient's family. Procedure Code(s): --- Professional ---                    [id number], Esophagogastroduodenoscopy, flexible, transoral;                     with transendoscopic balloon dilation of esophagus (less                     than 30 mm diameter)                    43239, Esophagogastroduodenoscopy, flexible, transoral;                     with biopsy, single or multiple CPT copyright 2014 American Medical Association. All rights reserved. The codes documented in this report are preliminary and upon coder review may  be revised to meet current compliance requirements. Mellody Life, MD 01/21/2015 9:35:49 AM This report has been signed electronically. Number of Addenda: 0 Note Initiated On: 01/21/2015 9:14 AM      [facility]

## 2015-01-21 NOTE — H&P (Signed)
Primary Care Physician:  Bobetta Lime, MD  Pre-Procedure History & Physical: HPI:  [name] is a 81 y.o. female is here for an colonoscopy / EGD   Past Medical History  Diagnosis Date  . GERD (gastroesophageal reflux disease)   . Vertigo   . Essential hypertension, benign   . Paroxysmal supraventricular tachycardia   . Pure hypercholesterolemia   . MS (multiple sclerosis)   . Fibromyalgia   . Diverticulosis   . Diabetes mellitus without complication   . Osteoarthritis   . Dyslipidemia   . SOB (shortness of breath)     SECONDARY TO REFLUX  . Chest pain     SECONDARY TO GERD  . Peripheral neuropathy     MILD  . Cancer   . Depression   . Chronic interstitial cystitis     Past Surgical History  Procedure Laterality Date  . Total abdominal hysterectomy w/ bilateral salpingoophorectomy    . Mastectomy Right     DX MASTITIS/NO CANCER.Marland KitchenSALINE IMPLANT  . Cholecystectomy    . Hemorrhoid surgery      WITH RECONSTRUCTION  . Bladder tacking      X 3  . Eye surgeries      WITH BUCKLE DETACHMENT OF THE RETINA  . Tonsillectomy    . Appendectomy    . Tympanoplasty    . Breast surgery    . Mastectomy    . Breast lumpectomy      Prior to Admission medications   Medication Sig Start Date End Date Taking? Authorizing Provider  cetirizine (ZYRTEC) 10 MG tablet Take 10 mg by mouth daily.   Yes Historical Provider, MD  Cyanocobalamin (VITAMIN B 12 PO) Take 1 tablet by mouth daily.   Yes Historical Provider, MD  diazepam (VALIUM) 2 MG tablet Take 2 mg by mouth every 6 (six) hours as needed for anxiety.   Yes Historical Provider, MD  dicyclomine (BENTYL) 10 MG capsule Take 10 mg by mouth 3 (three) times daily.   Yes Historical Provider, MD  flecainide (TAMBOCOR) 50 MG tablet Take 50 mg by mouth 2 (two) times daily.   Yes Historical Provider, MD  furosemide (LASIX) 20 MG tablet Take 20 mg by mouth daily as needed.   Yes Historical Provider, MD  meclizine (ANTIVERT) 25 MG  tablet Take 25 mg by mouth every 6 (six) hours as needed for dizziness.   Yes Historical Provider, MD  metFORMIN (GLUCOPHAGE) 500 MG tablet Take 1,000 mg by mouth 2 (two) times daily with a meal.   Yes Historical Provider, MD  mupirocin ointment (BACTROBAN) 2 % Place 1 application into the nose 2 (two) times daily.   Yes Historical Provider, MD  nisoldipine (SULAR) 30 MG 24 hr tablet Take 30 mg by mouth daily.   Yes Historical Provider, MD  omeprazole (PRILOSEC) 20 MG capsule Take 20 mg by mouth daily.   Yes Historical Provider, MD  pentosan polysulfate (ELMIRON) 100 MG capsule Take 100 mg by mouth daily.   Yes Historical Provider, MD  pregabalin (LYRICA) 75 MG capsule Take 75 mg by mouth at bedtime.   Yes Historical Provider, MD  sertraline (ZOLOFT) 50 MG tablet Take 50 mg by mouth daily.   Yes Historical Provider, MD  simvastatin (ZOCOR) 40 MG tablet Take 40 mg by mouth daily.   Yes Historical Provider, MD  triamcinolone (NASACORT AQ) 55 MCG/ACT AERO nasal inhaler Place 2 sprays into the nose daily.   Yes Historical Provider, MD    Allergies as of  01/17/2015 - Review Complete 01/06/2015  Allergen Reaction Noted  . Decadron [dexamethasone] Nausea And Vomiting and Other (See Comments) 01/06/2015  . Diltiazem hcl Other (See Comments)   . Flagyl [metronidazole] Nausea Only 01/06/2015  . Gabapentin Other (See Comments)   . Interferon beta-1a Other (See Comments)   . Ivp dye [iodinated diagnostic agents] Swelling and Other (See Comments) 01/06/2015  . Sulfonamide derivatives Other (See Comments)     History reviewed. No pertinent family history.  History   Social History  . Marital Status: Married    Spouse Name: Marcello Moores  . Number of Children: 4  . Years of Education: N/A   Occupational History  . 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 Smoking status: Never Smoker   . Smokeless tobacco: Never Used  . Alcohol Use: No  . Drug Use: No  . Sexual  Activity: Not on file   Other Topics Concern  . Not on file   Social History Narrative   1 SON, 3 DAUGHTER'S   15 GRANDCHILDREN   LIVING AT [facility] [employer]     Physical Exam: BP 133/68 mmHg  Pulse 84  Temp(Src) 99.3 F (37.4 C) (Tympanic)  Resp 14  SpO2 98% General:   Alert,  pleasant and cooperative in NAD Head:  Normocephalic and atraumatic. Neck:  Supple; no masses or thyromegaly. Lungs:  Clear throughout to auscultation.    Heart:  Regular rate and rhythm. Abdomen:  Soft, nontender and nondistended. Normal bowel sounds, without guarding, and without rebound.   Neurologic:  Alert and  oriented x4;  grossly normal neurologically.  Impression/Plan: [name] is here for an colonoscopy to be performed for IDA, EGD for IDA and dysphagia  Risks, benefits, limitations, and alternatives regarding  Colonoscopy/EGD have been reviewed with the patient.  Questions have been answered.  All parties agreeable.   Josefine Class, MD  01/21/2015, 9:07 AM

## 2015-01-21 NOTE — Discharge Instructions (Signed)

## 2015-01-21 NOTE — Anesthesia Postprocedure Evaluation (Signed)
  Anesthesia Post-op Note  Patient: [name]  Procedure(s) Performed: Procedure(s): COLONOSCOPY WITH PROPOFOL (N/A) ESOPHAGOGASTRODUODENOSCOPY (EGD) (N/A)  Anesthesia type:General  Patient location: PACU  Post pain: Pain level controlled  Post assessment: Post-op Vital signs reviewed, Patient's Cardiovascular Status Stable, Respiratory Function Stable, Patent Airway and No signs of Nausea or vomiting  Post vital signs: Reviewed and stable  Last Vitals:  Filed Vitals:   01/21/15 1036  BP: 134/73  Pulse: 72  Temp:   Resp: 16    Level of consciousness: awake, alert  and patient cooperative  Complications: No apparent anesthesia complications

## 2015-01-24 LAB — SURGICAL PATHOLOGY

## 2015-01-31 ENCOUNTER — Encounter: Payer: Self-pay | Admitting: Gastroenterology

## 2015-02-04 ENCOUNTER — Encounter: Payer: Self-pay | Admitting: Internal Medicine

## 2015-02-05 ENCOUNTER — Other Ambulatory Visit: Payer: Self-pay | Admitting: Family Medicine

## 2015-02-05 DIAGNOSIS — E118 Type 2 diabetes mellitus with unspecified complications: Secondary | ICD-10-CM

## 2015-02-16 ENCOUNTER — Other Ambulatory Visit: Payer: Self-pay | Admitting: Family Medicine

## 2015-02-16 DIAGNOSIS — E785 Hyperlipidemia, unspecified: Secondary | ICD-10-CM

## 2015-02-21 ENCOUNTER — Ambulatory Visit (INDEPENDENT_AMBULATORY_CARE_PROVIDER_SITE_OTHER): Payer: Commercial Managed Care - HMO | Admitting: Family Medicine

## 2015-02-21 ENCOUNTER — Encounter: Payer: Self-pay | Admitting: Family Medicine

## 2015-02-21 VITALS — BP 124/80 | HR 89 | Temp 98.9°F | Resp 16 | Ht 63.0 in | Wt 153.1 lb

## 2015-02-21 DIAGNOSIS — Z0000 Encounter for general adult medical examination without abnormal findings: Secondary | ICD-10-CM | POA: Diagnosis not present

## 2015-02-21 DIAGNOSIS — R296 Repeated falls: Secondary | ICD-10-CM | POA: Diagnosis not present

## 2015-02-21 NOTE — Progress Notes (Signed)
Name: [name]   MRN: [id number]    DOB: 11-21-1929   Date:02/21/2015       Progress Note  Subjective  Chief Complaint  Chief Complaint  Patient presents with  . Follow-up    patient is here for a 79-monthfollow-up     HPI  Patient is here today for a Female Medicare Wellness Visit:  The patient has been in otherwise stable health and continues to see her several specialists for ongoing testing and management that she request.  She voices concerns regarding frequent falls. Home nursing has not been coming out to their home as they did previously.    Past Medical History  Diagnosis Date  . GERD (gastroesophageal reflux disease)   . Vertigo   . Essential hypertension, benign   . Paroxysmal supraventricular tachycardia   . Pure hypercholesterolemia   . MS (multiple sclerosis)   . Fibromyalgia   . Diverticulosis   . Diabetes mellitus without complication   . Osteoarthritis   . Dyslipidemia   . SOB (shortness of breath)     SECONDARY TO REFLUX  . Chest pain     SECONDARY TO GERD  . Peripheral neuropathy     MILD  . Cancer   . Depression   . Chronic interstitial cystitis     Past Surgical History  Procedure Laterality Date  . Total abdominal hysterectomy w/ bilateral salpingoophorectomy    . Mastectomy Right     DX MASTITIS/NO CANCER..Marland KitchenALINE IMPLANT  . Cholecystectomy    . Hemorrhoid surgery      WITH RECONSTRUCTION  . Bladder tacking      X 3  . Eye surgeries      WITH BUCKLE DETACHMENT OF THE RETINA  . Tonsillectomy    . Appendectomy    . Tympanoplasty    . Breast surgery    . Mastectomy    . Breast lumpectomy    . Colonoscopy with propofol N/A 01/21/2015    Procedure: COLONOSCOPY WITH PROPOFOL;  Surgeon: MJosefine Class MD;  [facility];  Service: Endoscopy;  Laterality: N/A;  . Esophagogastroduodenoscopy N/A 01/21/2015    Procedure: ESOPHAGOGASTRODUODENOSCOPY (EGD);  Surgeon: MJosefine Class MD;  [facility];   Service: Endoscopy;  Laterality: N/A;    No family history on file.  History   Social History  . Marital Status: Married    Spouse Name: TMarcello Moores . Number of Children: 4  . Years of Education: N/A   Occupational History  . 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 Smoking status: Never Smoker   . Smokeless tobacco: Never Used  . Alcohol Use: No  . Drug Use: No  . Sexual Activity: No   Other Topics Concern  . Not on file   Social History Narrative   1 SON, 3 DAUGHTER'S   15 GRANDCHILDREN   [place]    Current outpatient prescriptions:  .  Cholecalciferol (VITAMIN D-1000 MAX ST) 1000 UNITS tablet, Take by mouth., Disp: , Rfl:  .  cyanocobalamin ([employer]S VITAMIN B12) 2000 MCG tablet, Take by mouth., Disp: , Rfl:  .  ferrous fumarate (HEMOCYTE - 106 MG FE) 325 (106 FE) MG TABS tablet, Take 1 tablet by mouth., Disp: , Rfl:  .  metFORMIN (GLUCOPHAGE) 500 MG tablet, Take by mouth., Disp: , Rfl:  .  omeprazole (PRILOSEC) 20 MG capsule, Take 20 mg by mouth daily., Disp: , Rfl:  .  Probiotic Product (PROBIOTIC COLON SUPPORT) CAPS, Take by mouth., Disp: , Rfl:  .  rosuvastatin (CRESTOR) 5 MG tablet, Take by mouth., Disp: , Rfl:  .  sertraline (ZOLOFT) 50 MG tablet, Take by mouth., Disp: , Rfl:   Allergies  Allergen Reactions  . Biaxin [Clarithromycin] Other (See Comments) and Diarrhea  . Copaxone [Glatiramer Acetate]   . Decadron [Dexamethasone] Nausea And Vomiting and Other (See Comments)    Other reaction(s): Muscle Pain Reaction:  Abdominal pain  . Diltiazem Hcl Other (See Comments)    Reaction:  Unknown   . Diltiazem Hcl Other (See Comments)  . Flagyl [Metronidazole] Nausea Only and Diarrhea    Other reaction(s): Vomiting  . Gabapentin Other (See Comments)    "burning all over" Reaction:  Unknown   . Iohexol Swelling and Other (See Comments)     Desc: tongue swelling with "IVP dye" sccording to nurses notes with a lumbar  myelo-12/09- asm, Onset Date: [id number]  Pts tongue swells.  Clementeen Hoof [Iodinated Diagnostic Agents] Swelling and Other (See Comments)    Passed out  Pts tongue swells.   . Levothyroxine Nausea Only  . Sulfonamide Derivatives Other (See Comments)    Reaction:  Unknown   . Synthroid [Levothyroxine Sodium]   . Copaxone  [Glatiramer] Rash  . Interferon Beta-1a Other (See Comments) and Rash    Reaction:  Unknown     Fall Risk: Fall Risk  02/21/2015  Falls in the past year? Yes  Number falls in past yr: 2 or more  Injury with Fall? Yes  Risk Factor Category  High Fall Risk  Follow up Falls evaluation completed;Education provided;Falls prevention discussed    Depression screen [facility] 2/9 02/21/2015  Decreased Interest 0  Down, Depressed, Hopeless 1  PHQ - 2 Score 1    Functional Status Survey: Is the patient deaf or have difficulty hearing?: Yes Does the patient have difficulty seeing, even when wearing glasses/contacts?: Yes Does the patient have difficulty concentrating, remembering, or making decisions?: Yes Does the patient have difficulty walking or climbing stairs?: Yes Does the patient have difficulty dressing or bathing?: No Does the patient have difficulty doing errands alone such as visiting a doctor's office or shopping?: No   ROS  CONSTITUTIONAL: No significant weight changes, fever, chills, weakness or fatigue.  HEENT:  - Eyes: No visual changes.  - Ears: No auditory changes. No pain.  - Nose: No sneezing, congestion, runny nose. - Throat: No sore throat. No changes in swallowing. SKIN: No rash or itching.  CARDIOVASCULAR: No chest pain, chest pressure or chest discomfort. No palpitations or edema.  RESPIRATORY: No shortness of breath, cough or sputum.  GASTROINTESTINAL: No anorexia, nausea, vomiting. No changes in bowel habits. No abdominal pain or blood.  GENITOURINARY: No dysuria. No frequency. No discharge.  NEUROLOGICAL: No headache, dizziness, syncope,  paralysis, ataxia, numbness or tingling in the extremities. No memory changes. No change in bowel or bladder control.  MUSCULOSKELETAL: Chronic joint pain. No muscle pain. HEMATOLOGIC: No anemia, bleeding or bruising.  LYMPHATICS: No enlarged lymph nodes.  PSYCHIATRIC: No change in mood. No change in sleep pattern.  ENDOCRINOLOGIC: No reports of sweating, cold or heat intolerance. No polyuria or polydipsia.   Objective  Filed Vitals:   02/21/15 0949  BP: 124/80  Pulse: 89  Temp: 98.9 F (37.2 C)  TempSrc: Oral  Resp: 16  Height: 5' 3"  (1.6 m)  Weight: 153 lb 1.6 oz (69.446 kg)  SpO2: 95%   Body mass index  is 27.13 kg/(m^2).  Physical Exam  Constitutional: Patient appears well-developed and well-nourished. In no distress.  HEENT:  - Head: Normocephalic and atraumatic.  - Ears: Bilateral TMs gray, no erythema or effusion - Nose: Nasal mucosa moist - Mouth/Throat: Oropharynx is clear and moist. No tonsillar hypertrophy or erythema. No post nasal drainage.  - Eyes: Conjunctivae clear, EOM movements normal. PERRLA. No scleral icterus.  Neck: Normal range of motion. Neck supple. No JVD present. No thyromegaly present.  Cardiovascular: Normal rate, regular rhythm and normal heart sounds.  No murmur heard.  Pulmonary/Chest: Effort normal and breath sounds normal. No respiratory distress. Musculoskeletal: Normal range of motion bilateral UE and LE, no joint effusions. Peripheral vascular: Bilateral LE no edema. Neurological: CN II-XII grossly intact with no focal deficits. Alert and oriented to person, place, and time. Coordination, balance, strength, speech and gait are normal.  Skin: Skin is warm and dry. No rash noted. No erythema.  Psychiatric: Patient has a normal mood and affect. Behavior is normal in office today. Judgment and thought content normal in office today.   Assessment & Plan  1. Medicare annual wellness visit, subsequent Due for blood work at our next follow  up.   Functional ability/safety issues: No Issues Hearing issues: Addressed  Activities of daily living: Discussed Home safety issues: No Issues  End Of Life Planning: Offered verbal information regarding advanced directives, healthcare power of attorney.  Preventative care, Health maintenance, Preventative health measures discussed.  Preventative screenings discussed today: lab work, colonoscopy, PAP, mammogram, DEXA.  Low Dose CT Chest recommended if Age 67-80 years, 30 pack-year currently smoking OR have quit w/in 15years.   Lifestyle risk factor issued reviewed: Diet, exercise, weight management, advised patient smoking is not healthy, nutrition/diet.  Preventative health measures discussed (5-10 year plan).  Reviewed and recommended vaccinations: - Pneumovax  - Prevnar  - Annual Influenza - Zostavax - Tdap   Depression screening: Done Fall risk screening: Done Discuss ADLs/IADLs: Done  Current medical providers: See HPI  Other health risk factors identified this visit: No other issues Cognitive impairment issues: None identified  All above discussed with patient. Appropriate education, counseling and referral will be made based upon the above.    2. Frequent falls Despite home nursing care, home safety precautions, medication reconciliation, walker use, gait and balance training she continues to have frequent falls.

## 2015-02-24 DIAGNOSIS — D500 Iron deficiency anemia secondary to blood loss (chronic): Secondary | ICD-10-CM | POA: Diagnosis not present

## 2015-03-24 ENCOUNTER — Encounter: Payer: Self-pay | Admitting: *Deleted

## 2015-03-24 ENCOUNTER — Other Ambulatory Visit: Payer: Self-pay | Admitting: *Deleted

## 2015-04-01 ENCOUNTER — Telehealth: Payer: Self-pay

## 2015-04-01 ENCOUNTER — Ambulatory Visit (INDEPENDENT_AMBULATORY_CARE_PROVIDER_SITE_OTHER): Payer: Commercial Managed Care - HMO | Admitting: Urology

## 2015-04-01 ENCOUNTER — Other Ambulatory Visit: Payer: Self-pay | Admitting: Family Medicine

## 2015-04-01 ENCOUNTER — Encounter: Payer: Self-pay | Admitting: Urology

## 2015-04-01 VITALS — BP 163/83 | HR 71 | Ht 62.0 in | Wt 152.6 lb

## 2015-04-01 DIAGNOSIS — N3010 Interstitial cystitis (chronic) without hematuria: Secondary | ICD-10-CM

## 2015-04-01 DIAGNOSIS — N390 Urinary tract infection, site not specified: Secondary | ICD-10-CM

## 2015-04-01 DIAGNOSIS — R300 Dysuria: Secondary | ICD-10-CM | POA: Insufficient documentation

## 2015-04-01 LAB — URINALYSIS, COMPLETE
Bilirubin, UA: NEGATIVE
GLUCOSE, UA: NEGATIVE
KETONES UA: NEGATIVE
NITRITE UA: NEGATIVE
PROTEIN UA: NEGATIVE
SPEC GRAV UA: 1.025 (ref 1.005–1.030)
Urobilinogen, Ur: 0.2 mg/dL (ref 0.2–1.0)
pH, UA: 5.5 (ref 5.0–7.5)

## 2015-04-01 LAB — MICROSCOPIC EXAMINATION

## 2015-04-01 MED ORDER — LIDOCAINE HCL 2 % IJ SOLN: 11.0000 mL | Freq: Once | INTRAMUSCULAR | Status: DC

## 2015-04-01 MED ORDER — AMOXICILLIN-POT CLAVULANATE 875-125 MG PO TABS: 1.0000 | ORAL_TABLET | Freq: Two times a day (BID) | ORAL | Status: DC

## 2015-04-01 NOTE — Telephone Encounter (Signed)
Got a call from [place] with [facility] requesting a referral for this patient since she has an appt today at 10:15am.  ICD Code N30.10 (595.1) & R30.0 (788.1)  She was told by Suanne Marker that Darrick Penna was out today but that we will try to get it in, if not then she will have to reschedule.

## 2015-04-01 NOTE — Telephone Encounter (Signed)
Referral placed.

## 2015-04-04 ENCOUNTER — Telehealth: Payer: Self-pay

## 2015-04-04 DIAGNOSIS — N390 Urinary tract infection, site not specified: Secondary | ICD-10-CM

## 2015-04-04 LAB — CULTURE, URINE COMPREHENSIVE

## 2015-04-04 MED ORDER — AMOXICILLIN-POT CLAVULANATE 875-125 MG PO TABS: 1.0000 | ORAL_TABLET | Freq: Two times a day (BID) | ORAL | Status: AC

## 2015-04-04 NOTE — Telephone Encounter (Signed)
-----   Message from Nori Riis, PA-C sent at 04/04/2015 11:08 AM EDT ----- Patient has a +UCx.  They need to start Augmentin 875/125 one  twice daily for seven days.  Patient has an upcoming appointment with me.

## 2015-04-04 NOTE — Telephone Encounter (Signed)
Spoke with Olivia Mackie at [facility] and notified her that I have obtained the [facility] referral as requested ICD-10: N30.10 & R30.0 Auth # [id number] Start- 04/04/2015 Expires- 10/01/2015

## 2015-04-04 NOTE — Telephone Encounter (Signed)
LMOM- medication faxed

## 2015-04-04 NOTE — Telephone Encounter (Signed)
Spoke with pt in reference to UTI. Pt voiced understanding.

## 2015-04-07 DIAGNOSIS — H04123 Dry eye syndrome of bilateral lacrimal glands: Secondary | ICD-10-CM | POA: Diagnosis not present

## 2015-04-08 ENCOUNTER — Encounter: Payer: Self-pay | Admitting: Family Medicine

## 2015-04-08 ENCOUNTER — Encounter: Payer: Self-pay | Admitting: Obstetrics and Gynecology

## 2015-04-08 ENCOUNTER — Ambulatory Visit (INDEPENDENT_AMBULATORY_CARE_PROVIDER_SITE_OTHER): Payer: Commercial Managed Care - HMO | Admitting: Obstetrics and Gynecology

## 2015-04-08 VITALS — BP 157/86 | HR 73 | Ht 62.0 in | Wt 150.0 lb

## 2015-04-08 DIAGNOSIS — N3010 Interstitial cystitis (chronic) without hematuria: Secondary | ICD-10-CM | POA: Diagnosis not present

## 2015-04-08 LAB — URINALYSIS, COMPLETE
BILIRUBIN UA: NEGATIVE
Glucose, UA: NEGATIVE
KETONES UA: NEGATIVE
LEUKOCYTES UA: NEGATIVE
Nitrite, UA: NEGATIVE
PH UA: 5 (ref 5.0–7.5)
PROTEIN UA: NEGATIVE
SPEC GRAV UA: 1.025 (ref 1.005–1.030)
UUROB: 0.2 mg/dL (ref 0.2–1.0)

## 2015-04-08 LAB — MICROSCOPIC EXAMINATION: Bacteria, UA: NONE SEEN

## 2015-04-08 MED ORDER — LIDOCAINE HCL 2 % EX GEL
1.0000 "application " | Freq: Once | CUTANEOUS | Status: AC
  Administered 2015-04-08: 1 via URETHRAL

## 2015-04-08 MED ORDER — SODIUM BICARBONATE 8.4 % IV SOLN
11.0000 mL | Freq: Once | INTRAVENOUS | Status: AC
  Administered 2015-04-08: 11 mL

## 2015-04-08 NOTE — Progress Notes (Signed)
Bladder Rescue Solution Instillation  Due to IC patient is present today for a Rescue Solution Treatment.  Patient was cleaned and prepped in a sterile fashion with betadine and lidocaine 2% jelly was instilled into the urethra.  A 14 FR catheter was inserted, urine return was noted 20ml, urine was yellow in color.  Instilled a solution consisting of 65ml of Sodium Bicarb, 2 ml Lidocaine and 1 ml of Heparin. The catheter was then removed. Patient tolerated well, no complications were noted.   Performed by: Fonnie Jarvis, CMA  Follow up/ Additional Notes: Patient will follow up next week for another treatment

## 2015-04-08 NOTE — Progress Notes (Signed)
04/08/2015 4:16 PM   [name] 22-Aug-1929 [id number]  Referring provider: Bobetta Lime, MD [address] [place], [place] [address]  Chief Complaint  Patient presents with  . Cystitis    Rescuse solution    HPI: [name] is an 81 year old female with a history of interstitial cystitis presenting today for rescue solution administration and recheck on UTI. She was started on Augmentin last week for a positive urine culture. She reports some improvement in symptoms but is continuing to have abdominal discomfort and some nausea related to taking antibiotic. She reports mild subjective fevers in the morning. She has not taken her temperature use and a thermometer.  PMH: Past Medical History  Diagnosis Date  . GERD (gastroesophageal reflux disease)   . Vertigo   . Essential hypertension, benign   . Paroxysmal supraventricular tachycardia   . Pure hypercholesterolemia   . MS (multiple sclerosis)   . Fibromyalgia   . Diverticulosis   . Diabetes mellitus without complication   . Osteoarthritis   . Dyslipidemia   . SOB (shortness of breath)     SECONDARY TO REFLUX  . Chest pain     SECONDARY TO GERD  . Peripheral neuropathy     MILD  . Cancer     breast  . Depression   . Chronic interstitial cystitis   . History of fractured rib   . Chronic right hip pain   . History of TIA (transient ischemic attack)   . Carpal tunnel syndrome   . Thyroid nodule   . Hearing loss of left ear   . Vitamin B 12 deficiency   . Protein malnutrition   . Frequent falls   . Anxiety and depression   . Irritable bowel   . Recurrent UTI     Surgical History: Past Surgical History  Procedure Laterality Date  . Total abdominal hysterectomy w/ bilateral salpingoophorectomy    . Mastectomy Right     DX MASTITIS/NO CANCER.Marland KitchenSALINE IMPLANT  . Cholecystectomy    . Hemorrhoid surgery      WITH RECONSTRUCTION  . Bladder tacking      X 3  . Eye surgeries      WITH BUCKLE  DETACHMENT OF THE RETINA  . Tonsillectomy    . Appendectomy    . Tympanoplasty    . Breast surgery    . Mastectomy    . Breast lumpectomy    . Colonoscopy with propofol N/A 01/21/2015    Procedure: COLONOSCOPY WITH PROPOFOL;  Surgeon: Diane Class, MD;  [facility];  Service: Endoscopy;  Laterality: N/A;  . Esophagogastroduodenoscopy N/A 01/21/2015    Procedure: ESOPHAGOGASTRODUODENOSCOPY (EGD);  Surgeon: Diane Class, MD;  Location: [facility]COPY;  Service: Endoscopy;  Laterality: N/A;    Home Medications:    Medication List       This list is accurate as of: 04/08/15  4:16 PM.  Always use your most recent med list.               acetic acid-aluminum acetate 2 % otic solution  Commonly known as:  DOMEBORO OTIC  every 3 (three) hours.     amoxicillin-clavulanate 875-125 MG per tablet  Commonly known as:  AUGMENTIN  Take 1 tablet by mouth every 12 (twelve) hours.     amoxicillin-clavulanate 875-125 MG per tablet  Commonly known as:  AUGMENTIN  Take 1 tablet by mouth 2 (two) times daily.     CRESTOR 5 MG tablet  Generic  drug:  rosuvastatin  Take 2.5 mg by mouth.     CVS VITAMIN B12 2000 MCG tablet  Generic drug:  cyanocobalamin  Take by mouth.     CYSTEX 162-162.5 MG Tabs  Generic drug:  Methenamine-Sodium Salicylate  Take by mouth.     dicyclomine 10 MG capsule  Commonly known as:  BENTYL  TAKE 1 CAPSULE (10 MG TOTAL) BY MOUTH EVERY 8 (EIGHT) HOURS AS NEEDED.     ferrous fumarate 325 (106 FE) MG Tabs tablet  Commonly known as:  HEMOCYTE - 106 mg FE  Take 1 tablet by mouth.     glucose blood test strip  1 each by Other route as needed for other. Use as instructed     meclizine 25 MG tablet  Commonly known as:  ANTIVERT  Take 25 mg by mouth 3 (three) times daily as needed for dizziness.     metFORMIN 500 MG tablet  Commonly known as:  GLUCOPHAGE  Take by mouth.     omeprazole 20 MG capsule  Commonly known as:  PRILOSEC  Take 20  mg by mouth daily.     oxyCODONE-acetaminophen 7.5-325 MG per tablet  Commonly known as:  PERCOCET  Take 1 tablet by mouth every 4 (four) hours as needed for severe pain.     phenazopyridine 95 MG tablet  Commonly known as:  PYRIDIUM  Take 95 mg by mouth 3 (three) times daily as needed for pain.     PROBIOTIC COLON SUPPORT Caps  Take by mouth.     sertraline 50 MG tablet  Commonly known as:  ZOLOFT  Take by mouth.     VITAMIN D-1000 MAX ST 1000 UNITS tablet  Generic drug:  Cholecalciferol  Take by mouth.        Allergies:  Allergies  Allergen Reactions  . Biaxin [Clarithromycin] Other (See Comments) and Diarrhea  . Copaxone [Glatiramer Acetate]   . Decadron [Dexamethasone] Nausea And Vomiting and Other (See Comments)    Other reaction(s): Muscle Pain Reaction:  Abdominal pain  . Diltiazem Hcl Other (See Comments)    Reaction:  Unknown   . Diltiazem Hcl Other (See Comments)  . Flagyl [Metronidazole] Nausea Only and Diarrhea    Other reaction(s): Vomiting  . Gabapentin Other (See Comments)    "burning all over" Reaction:  Unknown   . Iohexol Swelling and Other (See Comments)     Desc: tongue swelling with "IVP dye" sccording to nurses notes with a lumbar myelo-12/09- asm, Onset Date: [id number]  Pts tongue swells.  Clementeen Hoof [Iodinated Diagnostic Agents] Swelling and Other (See Comments)    Passed out  Pts tongue swells.   . Levothyroxine Nausea Only  . Sulfonamide Derivatives Other (See Comments)    Reaction:  Unknown   . Synthroid [Levothyroxine Sodium]   . Copaxone  [Glatiramer] Rash  . Interferon Beta-1a Other (See Comments) and Rash    Reaction:  Unknown     Family History: Family History  Problem Relation Age of Onset  . Arthritis Sister   . Aneurysm Brother   . Heart disease Daughter   . ALS Mother   . Colon cancer    . Kidney disease Neg Hx     Social History:  reports that she has never smoked. She has never used smokeless tobacco. She reports  that she does not drink alcohol or use illicit drugs.  ROS: UROLOGY Frequent Urination?: Yes Hard to postpone urination?: Yes Burning/pain with urination?: Yes Get up at  night to urinate?: Yes Leakage of urine?: Yes Urine stream starts and stops?: No Trouble starting stream?: No Do you have to strain to urinate?: No Blood in urine?: No Urinary tract infection?: No Sexually transmitted disease?: No Injury to kidneys or bladder?: No Painful intercourse?: No Weak stream?: No Currently pregnant?: No Vaginal bleeding?: No Last menstrual period?: n  Gastrointestinal Nausea?: Yes Vomiting?: No Indigestion/heartburn?: No Diarrhea?: Yes Constipation?: No  Constitutional Fever: No Night sweats?: No Weight loss?: No Fatigue?: Yes  Skin Skin rash/lesions?: Yes Itching?: No  Eyes Blurred vision?: Yes Double vision?: No  Ears/Nose/Throat Sore throat?: No Sinus problems?: Yes  Hematologic/Lymphatic Swollen glands?: No Easy bruising?: No  Cardiovascular Leg swelling?: Yes Chest pain?: No  Respiratory Cough?: No Shortness of breath?: No  Endocrine Excessive thirst?: No  Musculoskeletal Back pain?: Yes Joint pain?: Yes  Neurological Headaches?: Yes Dizziness?: Yes  Psychologic Depression?: Yes Anxiety?: Yes  Physical Exam: BP 157/86 mmHg  Pulse 73  Ht 5' 2"  (1.575 m)  Wt 150 lb (68.04 kg)  BMI 27.43 kg/m2  Constitutional:  Alert and oriented, No acute distress. HEENT: [place] AT, moist mucus membranes.  Trachea midline, no masses. Cardiovascular: No clubbing, cyanosis, or edema. Respiratory: Normal respiratory effort, no increased work of breathing. GI: Abdomen is soft, nontender, nondistended, no abdominal masses GU: No CVA tenderness.  Skin: No rashes, bruises or suspicious lesions. Lymph: No cervical or inguinal adenopathy. Neurologic: Grossly intact, no focal deficits, moving all 4 extremities. Psychiatric: Normal mood and affect.  Laboratory  Data:  Results for orders placed or performed in visit on 04/01/15  CULTURE, URINE COMPREHENSIVE  Result Value Ref Range   Urine Culture, Comprehensive Final report (A)    Result 1 Klebsiella pneumoniae (A)    ANTIMICROBIAL SUSCEPTIBILITY Comment   Microscopic Examination  Result Value Ref Range   WBC, UA 0-5 0 -  5 /hpf   RBC, UA 3-10 (A) 0 -  2 /hpf   Epithelial Cells (non renal) 0-10 0 - 10 /hpf   Bacteria, UA Moderate (A) None seen/Few  Urinalysis, Complete  Result Value Ref Range   Specific Gravity, UA 1.025 1.005 - 1.030   pH, UA 5.5 5.0 - 7.5   Color, UA Yellow Yellow   Appearance Ur Clear Clear   Leukocytes, UA Trace (A) Negative   Protein, UA Negative Negative/Trace   Glucose, UA Negative Negative   Ketones, UA Negative Negative   RBC, UA 2+ (A) Negative   Bilirubin, UA Negative Negative   Urobilinogen, Ur 0.2 0.2 - 1.0 mg/dL   Nitrite, UA Negative Negative   Microscopic Examination See below:     Lab Results  Component Value Date   HGBA1C * 04/12/2009    7.0 (NOTE) The ADA recommends the following therapeutic goal for glycemic control related to Hgb A1c measurement: Goal of therapy: <6.5 Hgb A1c  Reference: American Diabetes Association: Clinical Practice Recommendations 2010, Diabetes Care, 2010, 33: (Suppl  1).    Assessment & Plan: 81 year old female with history of interstitial cystitis and recent urinary tract infection being treated with Augmentin presents today for rescue solution administration.  1. Chronic interstitial cystitis without hematuria-  UA unremarkable today. Rescue solution completed. Patient instructed to finish her antibiotics. Per urine culture Augmentin should adequately treat previous infection. I believe her symptoms are most likely related to GI upset from the antibiotic. I encouraged her to take medication with food and try to complete the entire course. Should she develop fevers greater than 100.4, uncontrolled  pain, or worsening nausea  and vomiting she should seek immediate medical attention over the weekend. She will follow-up in one week for another rescue solution and recheck. - Urinalysis, Complete - heparin-lidocaine-sod bicarb bladder irrigation (Bladder rescue for bladder instillation procedures); Irrigate with 11 mLs as directed once.  Return in about 1 week (around 04/15/2015) for rescue solution and recheck.  Herbert Moors, [facility] [address], [place], [place] [address] [phone]

## 2015-04-09 DIAGNOSIS — N390 Urinary tract infection, site not specified: Secondary | ICD-10-CM | POA: Insufficient documentation

## 2015-04-09 DIAGNOSIS — N3010 Interstitial cystitis (chronic) without hematuria: Secondary | ICD-10-CM | POA: Insufficient documentation

## 2015-04-09 NOTE — Progress Notes (Signed)
04/01/2015 12:07 PM   [name] February 02, 1930 [id number]  Referring provider: Bobetta Lime, MD [address] [place], [place] [address]  Chief Complaint  Patient presents with  . Interstitial Cystitis    Rescue solution    HPI: Patient is an 81 year old white female with a history of interstitial cystitis, recurrent urinary tract infections and breast cancer with bilateral mastectomies who presents today for a rescue solution installation.   Patient states she has been experiencing frequent urination, urgency, nocturia 3 and leakage of urine. More recently she started to develop dysuria and suprapubic pain.  She denies any gross hematuria, fevers, chills, nausea or vomiting.  Her UA is positive for 3-10 RBC's per high-power field and moderate bacteria.   PMH: Past Medical History  Diagnosis Date  . GERD (gastroesophageal reflux disease)   . Vertigo   . Essential hypertension, benign   . Paroxysmal supraventricular tachycardia   . Pure hypercholesterolemia   . MS (multiple sclerosis)   . Fibromyalgia   . Diverticulosis   . Diabetes mellitus without complication   . Osteoarthritis   . Dyslipidemia   . SOB (shortness of breath)     SECONDARY TO REFLUX  . Chest pain     SECONDARY TO GERD  . Peripheral neuropathy     MILD  . Cancer     breast  . Depression   . Chronic interstitial cystitis   . History of fractured rib   . Chronic right hip pain   . History of TIA (transient ischemic attack)   . Carpal tunnel syndrome   . Thyroid nodule   . Hearing loss of left ear   . Vitamin B 12 deficiency   . Protein malnutrition   . Frequent falls   . Anxiety and depression   . Irritable bowel   . Recurrent UTI     Surgical History: Past Surgical History  Procedure Laterality Date  . Total abdominal hysterectomy w/ bilateral salpingoophorectomy    . Mastectomy Right     DX MASTITIS/NO CANCER.Marland KitchenSALINE IMPLANT  . Cholecystectomy    . Hemorrhoid  surgery      WITH RECONSTRUCTION  . Bladder tacking      X 3  . Eye surgeries      WITH BUCKLE DETACHMENT OF THE RETINA  . Tonsillectomy    . Appendectomy    . Tympanoplasty    . Breast surgery    . Mastectomy    . Breast lumpectomy    . Colonoscopy with propofol N/A 01/21/2015    Procedure: COLONOSCOPY WITH PROPOFOL;  Surgeon: Josefine Class, MD;  Location: [facility] ENDOSCOPY;  Service: Endoscopy;  Laterality: N/A;  . Esophagogastroduodenoscopy N/A 01/21/2015    Procedure: ESOPHAGOGASTRODUODENOSCOPY (EGD);  Surgeon: Josefine Class, MD;  Location: [facility] ENDOSCOPY;  Service: Endoscopy;  Laterality: N/A;    Home Medications:    Medication List       This list is accurate as of: 04/01/15 11:59 PM.  Always use your most recent med list.               acetic acid-aluminum acetate 2 % otic solution  Commonly known as:  DOMEBORO OTIC  every 3 (three) hours.     amoxicillin-clavulanate 875-125 MG per tablet  Commonly known as:  AUGMENTIN  Take 1 tablet by mouth every 12 (twelve) hours.     CRESTOR 5 MG tablet  Generic drug:  rosuvastatin  Take 2.5 mg by mouth.     [facility]  VITAMIN B12 2000 MCG tablet  Generic drug:  cyanocobalamin  Take by mouth.     CYSTEX 162-162.5 MG Tabs  Generic drug:  Methenamine-Sodium Salicylate  Take by mouth.     dicyclomine 10 MG capsule  Commonly known as:  BENTYL  TAKE 1 CAPSULE (10 MG TOTAL) BY MOUTH EVERY 8 (EIGHT) HOURS AS NEEDED.     ferrous fumarate 325 (106 FE) MG Tabs tablet  Commonly known as:  HEMOCYTE - 106 mg FE  Take 1 tablet by mouth.     glucose blood test strip  1 each by Other route as needed for other. Use as instructed     meclizine 25 MG tablet  Commonly known as:  ANTIVERT  Take 25 mg by mouth 3 (three) times daily as needed for dizziness.     metFORMIN 500 MG tablet  Commonly known as:  GLUCOPHAGE  Take by mouth.     omeprazole 20 MG capsule  Commonly known as:  PRILOSEC  Take 20 mg by mouth daily.      oxyCODONE-acetaminophen 7.5-325 MG per tablet  Commonly known as:  PERCOCET  Take 1 tablet by mouth every 4 (four) hours as needed for severe pain.     phenazopyridine 95 MG tablet  Commonly known as:  PYRIDIUM  Take 95 mg by mouth 3 (three) times daily as needed for pain.     PROBIOTIC COLON SUPPORT Caps  Take by mouth.     sertraline 50 MG tablet  Commonly known as:  ZOLOFT  Take by mouth.     VITAMIN D-1000 MAX ST 1000 UNITS tablet  Generic drug:  Cholecalciferol  Take by mouth.        Allergies:  Allergies  Allergen Reactions  . Biaxin [Clarithromycin] Other (See Comments) and Diarrhea  . Copaxone [Glatiramer Acetate]   . Decadron [Dexamethasone] Nausea And Vomiting and Other (See Comments)    Other reaction(s): Muscle Pain Reaction:  Abdominal pain  . Diltiazem Hcl Other (See Comments)    Reaction:  Unknown   . Diltiazem Hcl Other (See Comments)  . Flagyl [Metronidazole] Nausea Only and Diarrhea    Other reaction(s): Vomiting  . Gabapentin Other (See Comments)    "burning all over" Reaction:  Unknown   . Iohexol Swelling and Other (See Comments)     Desc: tongue swelling with "IVP dye" sccording to nurses notes with a lumbar myelo-12/09- asm, Onset Date: [id number]  Pts tongue swells.  Clementeen Hoof [Iodinated Diagnostic Agents] Swelling and Other (See Comments)    Passed out  Pts tongue swells.   . Levothyroxine Nausea Only  . Sulfonamide Derivatives Other (See Comments)    Reaction:  Unknown   . Synthroid [Levothyroxine Sodium]   . Copaxone  [Glatiramer] Rash  . Interferon Beta-1a Other (See Comments) and Rash    Reaction:  Unknown     Family History: Family History  Problem Relation Age of Onset  . Arthritis Sister   . Aneurysm Brother   . Heart disease Daughter   . ALS Mother   . Colon cancer    . Kidney disease Neg Hx     Social History:  reports that she has never smoked. She has never used smokeless tobacco. She reports that she does not drink  alcohol or use illicit drugs.  ROS: UROLOGY Frequent Urination?: Yes Hard to postpone urination?: Yes Burning/pain with urination?: Yes Get up at night to urinate?: Yes Leakage of urine?: Yes Urine stream starts and stops?: No  Trouble starting stream?: No Do you have to strain to urinate?: No Blood in urine?: No Urinary tract infection?: No Sexually transmitted disease?: No Injury to kidneys or bladder?: No Painful intercourse?: No Weak stream?: No Currently pregnant?: No Vaginal bleeding?: No Last menstrual period?: n  Gastrointestinal Nausea?: No Vomiting?: No Indigestion/heartburn?: Yes Diarrhea?: Yes Constipation?: Yes  Constitutional Fever: No Night sweats?: Yes Weight loss?: Yes Fatigue?: Yes  Skin Skin rash/lesions?: Yes Itching?: No  Eyes Blurred vision?: Yes Double vision?: No  Ears/Nose/Throat Sore throat?: No Sinus problems?: Yes  Hematologic/Lymphatic Swollen glands?: No Easy bruising?: No  Cardiovascular Leg swelling?: Yes Chest pain?: No  Respiratory Cough?: No Shortness of breath?: No  Endocrine Excessive thirst?: Yes  Musculoskeletal Back pain?: Yes Joint pain?: Yes  Neurological Headaches?: Yes Dizziness?: Yes  Psychologic Depression?: Yes Anxiety?: Yes  Physical Exam: BP 163/83 mmHg  Pulse 71  Ht _0  (1.575 m)  Wt 152 lb 9.6 oz (69.219 kg)  BMI 27.90 kg/m2   Laboratory Data: Results for orders placed or performed in visit on 04/01/15  CULTURE, URINE COMPREHENSIVE  Result Value Ref Range   Urine Culture, Comprehensive Final report (A)    Result 1 Klebsiella pneumoniae (A)    ANTIMICROBIAL SUSCEPTIBILITY Comment   Microscopic Examination  Result Value Ref Range   WBC, UA 0-5 0 -  5 /hpf   RBC, UA 3-10 (A) 0 -  2 /hpf   Epithelial Cells (non renal) 0-10 0 - 10 /hpf   Bacteria, UA Moderate (A) None seen/Few  Urinalysis, Complete  Result Value Ref Range   Specific Gravity, UA 1.025 1.005 - 1.030   pH, UA  5.5 5.0 - 7.5   Color, UA Yellow Yellow   Appearance Ur Clear Clear   Leukocytes, UA Trace (A) Negative   Protein, UA Negative Negative/Trace   Glucose, UA Negative Negative   Ketones, UA Negative Negative   RBC, UA 2+ (A) Negative   Bilirubin, UA Negative Negative   Urobilinogen, Ur 0.2 0.2 - 1.0 mg/dL   Nitrite, UA Negative Negative   Microscopic Examination See below:    Lab Results  Component Value Date   WBC 4.8 01/06/2015   HGB 9.7* 01/06/2015   HCT 31.5* 01/06/2015   MCV 75.6* 01/06/2015   PLT 207 01/06/2015    Lab Results  Component Value Date   CREATININE 0.95 01/06/2015   Lab Results  Component Value Date   HGBA1C * 04/12/2009    7.0 (NOTE) The ADA recommends the following therapeutic goal for glycemic control related to Hgb A1c measurement: Goal of therapy: <6.5 Hgb A1c  Reference: American Diabetes Association: Clinical Practice Recommendations 2010, Diabetes Care, 2010, 33: (Suppl  1).   Assessment & Plan:    1. IC (interstitial cystitis):   We will defer her rescue solutions treatment today as her urinalysis looks suspicious for infection. I have started Augmentin empirically. Once urine culture identification and sensitivities are available, we will change the antibiotic if appropriate.  - Urinalysis, Complete  2. Recurrent UTI:   Patient has not had a documented infection since last September with [place]. I will send her urine for culture today as it appears suspicious for infection. Rescue solution is postponed. I have started her on Augmentin empirically awaiting culture results and sensitivities. She will return next week for her rescue solution.  - amoxicillin-clavulanate (AUGMENTIN) 875-125 MG per tablet; Take 1 tablet by mouth every 12 (twelve) hours.  Dispense: 14 tablet; Refill: 0 - CULTURE, URINE COMPREHENSIVE  Return in about 1 week (around 04/08/2015) for rescue solution.  Zara Council, [facility] [address] [place], [place] [address] [phone]

## 2015-04-14 ENCOUNTER — Ambulatory Visit (INDEPENDENT_AMBULATORY_CARE_PROVIDER_SITE_OTHER): Payer: Commercial Managed Care - HMO | Admitting: Urology

## 2015-04-14 ENCOUNTER — Encounter: Payer: Self-pay | Admitting: Urology

## 2015-04-14 VITALS — BP 167/81 | HR 75 | Ht 62.0 in | Wt 155.5 lb

## 2015-04-14 DIAGNOSIS — N3010 Interstitial cystitis (chronic) without hematuria: Secondary | ICD-10-CM | POA: Diagnosis not present

## 2015-04-14 LAB — URINALYSIS, COMPLETE
Bilirubin, UA: NEGATIVE
Glucose, UA: NEGATIVE
KETONES UA: NEGATIVE
NITRITE UA: NEGATIVE
Protein, UA: NEGATIVE
SPEC GRAV UA: 1.025 (ref 1.005–1.030)
Urobilinogen, Ur: 0.2 mg/dL (ref 0.2–1.0)
pH, UA: 5 (ref 5.0–7.5)

## 2015-04-14 LAB — MICROSCOPIC EXAMINATION

## 2015-04-14 MED ORDER — SODIUM BICARBONATE 8.4 % IV SOLN
11.0000 mL | Freq: Once | INTRAVENOUS | Status: AC
  Administered 2015-04-14: 11 mL

## 2015-04-14 MED ORDER — URIBEL 118 MG PO CAPS: ORAL_CAPSULE | ORAL | Status: DC

## 2015-04-14 NOTE — Progress Notes (Signed)
Bladder Rescue Solution Instillation  Due to IC patient is present today for a Rescue Solution Treatment.  Patient was cleaned and prepped in a sterile fashion with betadine and lidocaine 2% jelly was instilled into the urethra.  A 14 FR catheter was inserted, urine return was noted 8 ml, urine was yellow in color.  Instilled a solution consisting of 22ml of Sodium Bicarb, 2 ml Lidocaine and 1 ml of Heparin. The catheter was then removed. Patient tolerated well, no complications were noted.   Performed by: [place]  Follow up/ Additional Notes: 1wk

## 2015-04-15 ENCOUNTER — Encounter: Payer: Self-pay | Admitting: Family Medicine

## 2015-04-15 ENCOUNTER — Ambulatory Visit (INDEPENDENT_AMBULATORY_CARE_PROVIDER_SITE_OTHER): Payer: Commercial Managed Care - HMO | Admitting: Family Medicine

## 2015-04-15 VITALS — BP 142/88 | HR 86 | Temp 98.6°F | Resp 16 | Wt 155.7 lb

## 2015-04-15 DIAGNOSIS — R296 Repeated falls: Secondary | ICD-10-CM | POA: Diagnosis not present

## 2015-04-15 DIAGNOSIS — H539 Unspecified visual disturbance: Secondary | ICD-10-CM | POA: Diagnosis not present

## 2015-04-15 DIAGNOSIS — Z23 Encounter for immunization: Secondary | ICD-10-CM | POA: Insufficient documentation

## 2015-04-15 DIAGNOSIS — F418 Other specified anxiety disorders: Secondary | ICD-10-CM

## 2015-04-15 DIAGNOSIS — E7800 Pure hypercholesterolemia, unspecified: Secondary | ICD-10-CM

## 2015-04-15 DIAGNOSIS — E1149 Type 2 diabetes mellitus with other diabetic neurological complication: Secondary | ICD-10-CM

## 2015-04-15 DIAGNOSIS — F329 Major depressive disorder, single episode, unspecified: Secondary | ICD-10-CM

## 2015-04-15 DIAGNOSIS — F419 Anxiety disorder, unspecified: Secondary | ICD-10-CM

## 2015-04-15 LAB — POCT GLYCOSYLATED HEMOGLOBIN (HGB A1C): HEMOGLOBIN A1C: 6.5

## 2015-04-15 NOTE — Progress Notes (Signed)
Name: [name]   MRN: [id number]    DOB: [birth year]   Date:04/15/2015       Progress Note  Subjective  Chief Complaint  Chief Complaint  Patient presents with  . Diabetes  . Hypertension  . Hyperlipidemia    HPI   [name] is a 81 year old female with a complex medical history and ongoing issues with interstitial cystitis and reports today she is on antibiotics for a bladder infection.  In regards to her Diabetes she feels that her medical condition is well controlled. First diagnosed with DM Type II several years ago. Current diabetes medication regimen includes Metformin 564m once a day . Patient is taking medications as instructed. Overall the patient feels that their blood glucose is well controlled. Not checking blood glucose at home. No symptoms of hypoglycemia are reported today.  Related symptoms include increased fatigue and peripheral neuropathy.  Related medical diagnosis include HTN, HLD. The last retinal eye exam was just a few months ago per patient. Patient presents with hyperlipidemia.  She was tested because co morbid conditions such as DM II, HTN. There is a family history of hyperlipidemia. There is not a family history of early ischemia heart disease. Patient complains of depression with anxious moods, often worries about her health. She complains of depressed mood, fatigue and impaired memory. Onset was approximately several years ago, stable since that time.  She denies current suicidal and homicidal plan or intent.   Family history significant for alcoholism, anxiety, depression and substance abuse.Possible organic causes contributing are: medications, endocrine/metabolic, nutritional.  Risk factors: previous episode of depression Previous treatment includes Zoloft and individual therapy. She complains of the following side effects from the treatment: none. Due to frequent falls in and outside of the home (1-2 a week) she is requesting personal care services to  help with some tasks at home her and her husband (he is 824yo) are living by themselves and having a hard time with house keep up, cooking, cleaning, laundry. [name] also complaining of sudden onset visual loss while watching television that resolves on its own spontaneously within seconds. Not associated with floaters in vision, sensitivity to light, headaches, focal neurological deficits or syncope.      Past Medical History  Diagnosis Date  . GERD (gastroesophageal reflux disease)   . Vertigo   . Essential hypertension, benign   . Paroxysmal supraventricular tachycardia   . Pure hypercholesterolemia   . MS (multiple sclerosis)   . Fibromyalgia   . Diverticulosis   . Diabetes mellitus without complication   . Osteoarthritis   . Dyslipidemia   . SOB (shortness of breath)     SECONDARY TO REFLUX  . Chest pain     SECONDARY TO GERD  . Peripheral neuropathy     MILD  . Cancer     breast  . Depression   . Chronic interstitial cystitis   . History of fractured rib   . Chronic right hip pain   . History of TIA (transient ischemic attack)   . Carpal tunnel syndrome   . Thyroid nodule   . Hearing loss of left ear   . Vitamin B 12 deficiency   . Protein malnutrition   . Frequent falls   . Anxiety and depression   . Irritable bowel   . Recurrent UTI     Past Surgical History  Procedure Laterality Date  . Total abdominal hysterectomy w/ bilateral salpingoophorectomy    . Mastectomy  Right     DX MASTITIS/NO CANCER.Marland KitchenSALINE IMPLANT  . Cholecystectomy    . Hemorrhoid surgery      WITH RECONSTRUCTION  . Bladder tacking      X 3  . Eye surgeries      WITH BUCKLE DETACHMENT OF THE RETINA  . Tonsillectomy    . Appendectomy    . Tympanoplasty    . Breast surgery    . Mastectomy    . Breast lumpectomy    . Colonoscopy with propofol N/A 01/21/2015    Procedure: COLONOSCOPY WITH PROPOFOL;  Surgeon: Josefine Class, MD;  Location: [facility] ENDOSCOPY;  Service: Endoscopy;   Laterality: N/A;  . Esophagogastroduodenoscopy N/A 01/21/2015    Procedure: ESOPHAGOGASTRODUODENOSCOPY (EGD);  Surgeon: Josefine Class, MD;  Location: [facility] ENDOSCOPY;  Service: Endoscopy;  Laterality: N/A;    Family History  Problem Relation Age of Onset  . Arthritis Sister   . Aneurysm Brother   . Heart disease Daughter   . ALS Mother   . Colon cancer    . Kidney disease Neg Hx     Social History   Social History  . Marital Status: Married    Spouse Name: Marcello Moores  . Number of Children: 4  . Years of Education: N/A   Occupational History  . 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 Smoking status: Never Smoker   . Smokeless tobacco: Never Used  . Alcohol Use: No  . Drug Use: No  . Sexual Activity: No   Other Topics Concern  . Not on file   Social History Narrative   1 SON, 3 DAUGHTER'S   15 GRANDCHILDREN   LIVING AT [facility]     Current outpatient prescriptions:  .  acetic acid-aluminum acetate (DOMEBORO OTIC) 2 % otic solution, every 3 (three) hours., Disp: , Rfl:  .  amoxicillin-clavulanate (AUGMENTIN) 875-125 MG per tablet, Take 1 tablet by mouth every 12 (twelve) hours. (Patient not taking: Reported on 04/14/2015), Disp: 14 tablet, Rfl: 0 .  Cholecalciferol (VITAMIN D-1000 MAX ST) 1000 UNITS tablet, Take by mouth., Disp: , Rfl:  .  cyanocobalamin ([facility]S VITAMIN B12) 2000 MCG tablet, Take by mouth., Disp: , Rfl:  .  dicyclomine (BENTYL) 10 MG capsule, TAKE 1 CAPSULE (10 MG TOTAL) BY MOUTH EVERY 8 (EIGHT) HOURS AS NEEDED., Disp: , Rfl: 1 .  ferrous fumarate (HEMOCYTE - 106 MG FE) 325 (106 FE) MG TABS tablet, Take 1 tablet by mouth., Disp: , Rfl:  .  glucose blood test strip, 1 each by Other route as needed for other. Use as instructed, Disp: , Rfl:  .  meclizine (ANTIVERT) 25 MG tablet, Take 25 mg by mouth 3 (three) times daily as needed for dizziness., Disp: , Rfl:  .  metFORMIN (GLUCOPHAGE) 500 MG tablet, Take by mouth.,  Disp: , Rfl:  .  Meth-Hyo-M Bl-Na Phos-Ph Sal (URIBEL) 118 MG CAPS, One tablet four times daily as needed with 8 oz of water, Disp: 25 capsule, Rfl: 3 .  Methenamine-Sodium Salicylate (CYSTEX) 416-606.3 MG TABS, Take by mouth., Disp: , Rfl:  .  omeprazole (PRILOSEC) 20 MG capsule, Take 20 mg by mouth daily., Disp: , Rfl:  .  oxyCODONE-acetaminophen (PERCOCET) 7.5-325 MG per tablet, Take 1 tablet by mouth every 4 (four) hours as needed for severe pain., Disp: , Rfl:  .  phenazopyridine (PYRIDIUM) 95 MG tablet, Take 95 mg by mouth 3 (three) times daily as needed  for pain., Disp: , Rfl:  .  Probiotic Product (PROBIOTIC COLON SUPPORT) CAPS, Take by mouth., Disp: , Rfl:  .  rosuvastatin (CRESTOR) 5 MG tablet, Take 2.5 mg by mouth. , Disp: , Rfl:  .  sertraline (ZOLOFT) 50 MG tablet, Take by mouth., Disp: , Rfl:   Allergies  Allergen Reactions  . Biaxin [Clarithromycin] Other (See Comments) and Diarrhea  . Copaxone [Glatiramer Acetate]   . Decadron [Dexamethasone] Nausea And Vomiting and Other (See Comments)    Other reaction(s): Muscle Pain Reaction:  Abdominal pain  . Diltiazem Hcl Other (See Comments)    Reaction:  Unknown   . Diltiazem Hcl Other (See Comments)  . Flagyl [Metronidazole] Nausea Only and Diarrhea    Other reaction(s): Vomiting  . Gabapentin Other (See Comments)    "burning all over" Reaction:  Unknown   . Iohexol Swelling and Other (See Comments)     Desc: tongue swelling with "IVP dye" sccording to nurses notes with a lumbar myelo-12/09- asm, Onset Date: [id number]  Pts tongue swells.  Clementeen Hoof [Iodinated Diagnostic Agents] Swelling and Other (See Comments)    Passed out  Pts tongue swells.   . Levothyroxine Nausea Only  . Sulfonamide Derivatives Other (See Comments)    Reaction:  Unknown   . Synthroid [Levothyroxine Sodium]   . Copaxone  [Glatiramer] Rash  . Interferon Beta-1a Other (See Comments) and Rash    Reaction:  Unknown      ROS  CONSTITUTIONAL: No  significant weight changes, fever, chills. Chronic weakness or fatigue.  HEENT:  - Eyes: No visual changes.  - Ears: No auditory changes. No pain.  - Nose: No sneezing, congestion, runny nose. - Throat: No sore throat. No changes in swallowing. SKIN: No rash or itching.  CARDIOVASCULAR: No chest pain, chest pressure or chest discomfort. No palpitations or edema.  RESPIRATORY: No shortness of breath, cough or sputum.  GASTROINTESTINAL: No anorexia, nausea, vomiting. No changes in bowel habits. No abdominal pain or blood with bowel movements.  GENITOURINARY: No dysuria. No frequency. No discharge.  NEUROLOGICAL: No headache, dizziness, syncope, paralysis, ataxia.  Yes numbness, pain or tingling in the lower extremities. No memory changes. No change in bowel or bladder control.  MUSCULOSKELETAL: Chronic joint pain. No muscle pain. HEMATOLOGIC: No anemia, bleeding or bruising.  LYMPHATICS: No enlarged lymph nodes.  PSYCHIATRIC: No change in mood. No change in sleep pattern.  ENDOCRINOLOGIC: No reports of sweating, cold or heat intolerance. No polyuria or polydipsia.   Objective  Filed Vitals:   04/15/15 0826  BP: 142/88  Pulse: 86  Temp: 98.6 F (37 C)  TempSrc: Oral  Resp: 16  Weight: 155 lb 11.2 oz (70.625 kg)  SpO2: 95%   Body mass index is 28.47 kg/(m^2).  Physical Exam  Constitutional: Patient is well developed and well-nourished. In no distress. Walking with use of seated walker today. HEENT:  - Head: Normocephalic and atraumatic.  - Ears: Bilateral TMs gray, no erythema or effusion - Nose: Nasal mucosa moist - Mouth/Throat: Oropharynx is clear and moist. No tonsillar hypertrophy or erythema. No post nasal drainage.  - Eyes: Conjunctivae clear, EOM movements normal. PERRLA. No scleral icterus.  Neck: Normal range of motion. Neck supple. No JVD present. No thyromegaly present.  Cardiovascular: Normal rate, regular rhythm and normal heart sounds.  No murmur heard.   Pulmonary/Chest: Effort normal and breath sounds normal. No respiratory distress. Abdomen: Soft, non tender, bowel sounds normal in all quadrants. Musculoskeletal: Normal range of motion  bilateral UE and LE, no joint effusions. Strength LE 4/5 bilaterally with general wasting of hip extensor flexor muscle tone. Strength UE bilaterally 5/5 however she does have pain in her neck and trapezius muscle group that limit the duration of overhead use. Peripheral vascular: Bilateral LE no edema. Neurological: CN II-XII grossly intact with no focal deficits. Alert and oriented to person, place, and time. Coordination, balance, strength, speech and gait are normal.  Skin: Skin is warm and dry. No rash noted. No erythema.  Psychiatric: Patient has a stable mood and affect. Behavior is normal in office today. Judgment and thought content normal in office today.   Diabetic Foot Exam - Simple   Simple Foot Form  Diabetic Foot exam was performed with the following findings:  Yes 04/15/2015  8:35 AM  Visual Inspection  No deformities, no ulcerations, no other skin breakdown bilaterally:  Yes  Sensation Testing  Intact to touch and monofilament testing bilaterally:  Yes  Pulse Check  Posterior Tibialis and Dorsalis pulse intact bilaterally:  Yes  Comments     Results for orders placed or performed in visit on 04/15/15 (from the past 24 hour(s))  POCT HgB A1C     Status: Normal   Collection Time: 04/15/15  8:53 AM  Result Value Ref Range   Hemoglobin A1C 6.5     Assessment & Plan  1. DM (diabetes mellitus) type II controlled, neurological manifestation Hba1c 6.5% today at goal.  Patient's Hba1c goal is <8% as this is reasonable for the elderly population who is at risk for hypoglycemic events.   Encouraged patient to continue efforts on checking blood glucose on a daily basis. Fasting blood glucose control goal is 80-159m/dL and post prandial blood glucose control is <1837mdL.  Reviewed diet,  exercise, lifestyle changes and current medication regimen pertaining to diabetes with the patient.   Reminded patient of the required annual dilated retinal exam.     - POCT HgB A1C - CBC with Differential/Platelet - Comprehensive metabolic panel  2. Anxiety and depression Clinically stable findings based on clinical exam and on review of any pertinent results. Recommended to patient that they continue their current regimen with regular follow ups.   3. Hypercholesteremia Due for blood work. Continue statin therapy.  - Lipid panel  4. Need for immunization against influenza  - Flu vaccine HIGH DOSE PF (Fluzone High dose)  5. Visual changes Recommend discuss symptoms with eye specialist to rule out retinal detachment or other eye related issues. If ongoing will consider carotid artery, basilar circulatory or cerebrovascular circulatory disorder.   6. Frequent falls Despite gait and strength training [name] to have frequent falls. She is an appropriate candidate for personal care services and my staff will get in touch with [place] set this up.

## 2015-04-17 NOTE — Progress Notes (Signed)
04/14/2015 7:23 PM   [name] [birth year] [id number]  Referring provider: Bobetta Lime, MD [address] [place], [place] [address]  Chief Complaint  Patient presents with  . Other    f/u IC, rescue solution     HPI: Patient is an 81 year old white female with a history of interstitial cystitis who presents today for rescue solution installation.  She recently had an infection on 04/01/2015 with a positive urine culture for Klebsiella pneumonia.  She was prescribed Augmentin for 7 days and her urinary tract infection symptoms have abated. Her UA has also cleared.  Today, she is experiencing suprapubic burning. She denies gross hematuria, dysuria and back pain.  She is not experiencing fevers, chills, nausea or vomiting.   PMH: Past Medical History  Diagnosis Date  . GERD (gastroesophageal reflux disease)   . Vertigo   . Essential hypertension, benign   . Paroxysmal supraventricular tachycardia   . Pure hypercholesterolemia   . MS (multiple sclerosis)   . Fibromyalgia   . Diverticulosis   . Diabetes mellitus without complication   . Osteoarthritis   . Dyslipidemia   . SOB (shortness of breath)     SECONDARY TO REFLUX  . Chest pain     SECONDARY TO GERD  . Peripheral neuropathy     MILD  . Cancer     breast  . Depression   . Chronic interstitial cystitis   . History of fractured rib   . Chronic right hip pain   . History of TIA (transient ischemic attack)   . Carpal tunnel syndrome   . Thyroid nodule   . Hearing loss of left ear   . Vitamin B 12 deficiency   . Protein malnutrition   . Frequent falls   . Anxiety and depression   . Irritable bowel   . Recurrent UTI     Surgical History: Past Surgical History  Procedure Laterality Date  . Total abdominal hysterectomy w/ bilateral salpingoophorectomy    . Mastectomy Right     DX MASTITIS/NO CANCER.Marland KitchenSALINE IMPLANT  . Cholecystectomy    . Hemorrhoid surgery      WITH RECONSTRUCTION  .  Bladder tacking      X 3  . Eye surgeries      WITH BUCKLE DETACHMENT OF THE RETINA  . Tonsillectomy    . Appendectomy    . Tympanoplasty    . Breast surgery    . Mastectomy    . Breast lumpectomy    . Colonoscopy with propofol N/A 01/21/2015    Procedure: COLONOSCOPY WITH PROPOFOL;  Surgeon: Josefine Class, MD;  Location: [facility] ENDOSCOPY;  Service: Endoscopy;  Laterality: N/A;  . Esophagogastroduodenoscopy N/A 01/21/2015    Procedure: ESOPHAGOGASTRODUODENOSCOPY (EGD);  Surgeon: Josefine Class, MD;  Location: [facility] ENDOSCOPY;  Service: Endoscopy;  Laterality: N/A;    Home Medications:    Medication List       This list is accurate as of: 04/14/15 11:59 PM.  Always use your most recent med list.               acetic acid-aluminum acetate 2 % otic solution  Commonly known as:  DOMEBORO OTIC  every 3 (three) hours.     amoxicillin-clavulanate 875-125 MG per tablet  Commonly known as:  AUGMENTIN  Take 1 tablet by mouth every 12 (twelve) hours.     CRESTOR 5 MG tablet  Generic drug:  rosuvastatin  Take 2.5 mg by mouth.  [employer] VITAMIN B12 2000 MCG tablet  Generic drug:  cyanocobalamin  Take by mouth.     CYSTEX 162-162.5 MG Tabs  Generic drug:  Methenamine-Sodium Salicylate  Take by mouth.     dicyclomine 10 MG capsule  Commonly known as:  BENTYL  TAKE 1 CAPSULE (10 MG TOTAL) BY MOUTH EVERY 8 (EIGHT) HOURS AS NEEDED.     ferrous fumarate 325 (106 FE) MG Tabs tablet  Commonly known as:  HEMOCYTE - 106 mg FE  Take 1 tablet by mouth.     glucose blood test strip  1 each by Other route as needed for other. Use as instructed     meclizine 25 MG tablet  Commonly known as:  ANTIVERT  Take 25 mg by mouth 3 (three) times daily as needed for dizziness.     metFORMIN 500 MG tablet  Commonly known as:  GLUCOPHAGE  Take by mouth.     omeprazole 20 MG capsule  Commonly known as:  PRILOSEC  Take 20 mg by mouth daily.     oxyCODONE-acetaminophen 7.5-325 MG per  tablet  Commonly known as:  PERCOCET  Take 1 tablet by mouth every 4 (four) hours as needed for severe pain.     phenazopyridine 95 MG tablet  Commonly known as:  PYRIDIUM  Take 95 mg by mouth 3 (three) times daily as needed for pain.     PROBIOTIC COLON SUPPORT Caps  Take by mouth.     sertraline 50 MG tablet  Commonly known as:  ZOLOFT  Take by mouth.     URIBEL 118 MG Caps  One tablet four times daily as needed with 8 oz of water     VITAMIN D-1000 MAX ST 1000 UNITS tablet  Generic drug:  Cholecalciferol  Take by mouth.        Allergies:  Allergies  Allergen Reactions  . Biaxin [Clarithromycin] Other (See Comments) and Diarrhea  . Copaxone [Glatiramer Acetate]   . Decadron [Dexamethasone] Nausea And Vomiting and Other (See Comments)    Other reaction(s): Muscle Pain Reaction:  Abdominal pain  . Diltiazem Hcl Other (See Comments)    Reaction:  Unknown   . Diltiazem Hcl Other (See Comments)  . Flagyl [Metronidazole] Nausea Only and Diarrhea    Other reaction(s): Vomiting  . Gabapentin Other (See Comments)    "burning all over" Reaction:  Unknown   . Iohexol Swelling and Other (See Comments)     Desc: tongue swelling with "IVP dye" sccording to nurses notes with a lumbar myelo-12/09- asm, Onset Date: [id number]  Pts tongue swells.  Clementeen Hoof [Iodinated Diagnostic Agents] Swelling and Other (See Comments)    Passed out  Pts tongue swells.   . Levothyroxine Nausea Only  . Sulfonamide Derivatives Other (See Comments)    Reaction:  Unknown   . Synthroid [Levothyroxine Sodium]   . Copaxone  [Glatiramer] Rash  . Interferon Beta-1a Other (See Comments) and Rash    Reaction:  Unknown     Family History: Family History  Problem Relation Age of Onset  . Arthritis Sister   . Aneurysm Brother   . Heart disease Daughter   . ALS Mother   . Colon cancer    . Kidney disease Neg Hx     Social History:  reports that she has never smoked. She has never used smokeless  tobacco. She reports that she does not drink alcohol or use illicit drugs.   Physical Exam: BP 167/81 mmHg  Pulse 75  Ht _0  (1.575 m)  Wt 155 lb 8 oz (70.534 kg)  BMI 28.43 kg/m2   Laboratory Data: Lab Results  Component Value Date   WBC 4.8 01/06/2015   HGB 9.7* 01/06/2015   HCT 31.5* 01/06/2015   MCV 75.6* 01/06/2015   PLT 207 01/06/2015    Lab Results  Component Value Date   CREATININE 0.95 01/06/2015   Lab Results  Component Value Date   HGBA1C 6.5 04/15/2015    Urinalysis Results for orders placed or performed in visit on 04/14/15  Microscopic Examination  Result Value Ref Range   WBC, UA 6-10 (A) 0 -  5 /hpf   RBC, UA 0-2 0 -  2 /hpf   Epithelial Cells (non renal) 0-10 0 - 10 /hpf   Mucus, UA Present (A) Not Estab.   Bacteria, UA Few (A) None seen/Few   Yeast, UA Present (A) None seen  Urinalysis, Complete  Result Value Ref Range   Specific Gravity, UA 1.025 1.005 - 1.030   pH, UA 5.0 5.0 - 7.5   Color, UA Yellow Yellow   Appearance Ur Clear Clear   Leukocytes, UA 1+ (A) Negative   Protein, UA Negative Negative/Trace   Glucose, UA Negative Negative   Ketones, UA Negative Negative   RBC, UA 1+ (A) Negative   Bilirubin, UA Negative Negative   Urobilinogen, Ur 0.2 0.2 - 1.0 mg/dL   Nitrite, UA Negative Negative   Microscopic Examination See below:    Assessment & Plan:    1. IC (interstitial cystitis):   Patient had a rescue solutions still today. She tolerated the procedure well. She will return next week for another rescue solution installation.  She is also requesting a prescription for Uribel and I have sent a prescription to her pharmacy.  - Urinalysis, Complete - heparin-lidocaine-sod bicarb bladder irrigation (Bladder rescue for bladder instillation procedures); Irrigate with 11 mLs as directed once.   No Follow-up on file.  Zara Council, [facility] [address] [place], [place]  [address] [phone]

## 2015-04-18 DIAGNOSIS — E780 Pure hypercholesterolemia: Secondary | ICD-10-CM | POA: Diagnosis not present

## 2015-04-18 DIAGNOSIS — E1149 Type 2 diabetes mellitus with other diabetic neurological complication: Secondary | ICD-10-CM | POA: Diagnosis not present

## 2015-04-19 LAB — COMPREHENSIVE METABOLIC PANEL
ALBUMIN: 4.3 g/dL (ref 3.5–4.7)
ALT: 15 IU/L (ref 0–32)
AST: 8 IU/L (ref 0–40)
Albumin/Globulin Ratio: 1.9 (ref 1.1–2.5)
Alkaline Phosphatase: 50 IU/L (ref 39–117)
BUN / CREAT RATIO: 13 (ref 11–26)
BUN: 13 mg/dL (ref 8–27)
Bilirubin Total: 0.3 mg/dL (ref 0.0–1.2)
CALCIUM: 9.6 mg/dL (ref 8.7–10.3)
CO2: 26 mmol/L (ref 18–29)
CREATININE: 1.03 mg/dL — AB (ref 0.57–1.00)
Chloride: 102 mmol/L (ref 97–108)
GFR calc non Af Amer: 50 mL/min/{1.73_m2} — ABNORMAL LOW (ref >59)
GFR, EST AFRICAN AMERICAN: 58 mL/min/{1.73_m2} — AB (ref >59)
GLUCOSE: 115 mg/dL — AB (ref 65–99)
Globulin, Total: 2.3 g/dL (ref 1.5–4.5)
Potassium: 4.9 mmol/L (ref 3.5–5.2)
Sodium: 143 mmol/L (ref 134–144)
TOTAL PROTEIN: 6.6 g/dL (ref 6.0–8.5)

## 2015-04-19 LAB — LIPID PANEL
Chol/HDL Ratio: 2.6 ratio units (ref 0.0–4.4)
Cholesterol, Total: 217 mg/dL — ABNORMAL HIGH (ref 100–199)
HDL: 82 mg/dL (ref >39)
LDL CALC: 103 mg/dL — AB (ref 0–99)
Triglycerides: 159 mg/dL — ABNORMAL HIGH (ref 0–149)
VLDL CHOLESTEROL CAL: 32 mg/dL (ref 5–40)

## 2015-04-19 LAB — CBC WITH DIFFERENTIAL/PLATELET
BASOS ABS: 0 10*3/uL (ref 0.0–0.2)
Basos: 0 %
EOS (ABSOLUTE): 0.1 10*3/uL (ref 0.0–0.4)
Eos: 2 %
HEMOGLOBIN: 12.8 g/dL (ref 11.1–15.9)
Hematocrit: 39.9 % (ref 34.0–46.6)
IMMATURE GRANS (ABS): 0 10*3/uL (ref 0.0–0.1)
Immature Granulocytes: 0 %
LYMPHS: 29 %
Lymphocytes Absolute: 1.4 10*3/uL (ref 0.7–3.1)
MCH: 27 pg (ref 26.6–33.0)
MCHC: 32.1 g/dL (ref 31.5–35.7)
MCV: 84 fL (ref 79–97)
MONOCYTES: 8 %
Monocytes Absolute: 0.4 10*3/uL (ref 0.1–0.9)
NEUTROS ABS: 2.8 10*3/uL (ref 1.4–7.0)
Neutrophils: 61 %
Platelets: 189 10*3/uL (ref 150–379)
RBC: 4.74 x10E6/uL (ref 3.77–5.28)
RDW: 20.4 % — ABNORMAL HIGH (ref 12.3–15.4)
WBC: 4.7 10*3/uL (ref 3.4–10.8)

## 2015-04-22 ENCOUNTER — Ambulatory Visit (INDEPENDENT_AMBULATORY_CARE_PROVIDER_SITE_OTHER): Payer: Commercial Managed Care - HMO | Admitting: Urology

## 2015-04-22 ENCOUNTER — Ambulatory Visit (INDEPENDENT_AMBULATORY_CARE_PROVIDER_SITE_OTHER): Payer: Commercial Managed Care - HMO | Admitting: Family Medicine

## 2015-04-22 ENCOUNTER — Encounter: Payer: Self-pay | Admitting: Family Medicine

## 2015-04-22 ENCOUNTER — Encounter: Payer: Self-pay | Admitting: Urology

## 2015-04-22 VITALS — BP 166/82 | HR 78 | Ht 63.0 in | Wt 154.0 lb

## 2015-04-22 VITALS — BP 138/86 | HR 91 | Temp 98.3°F | Resp 16 | Wt 154.4 lb

## 2015-04-22 DIAGNOSIS — L0203 Carbuncle of face: Secondary | ICD-10-CM | POA: Diagnosis not present

## 2015-04-22 DIAGNOSIS — N3010 Interstitial cystitis (chronic) without hematuria: Secondary | ICD-10-CM

## 2015-04-22 DIAGNOSIS — L0202 Furuncle of face: Secondary | ICD-10-CM

## 2015-04-22 LAB — URINALYSIS, COMPLETE
Bilirubin, UA: POSITIVE — AB
Glucose, UA: NEGATIVE
Ketones, UA: NEGATIVE
Nitrite, UA: NEGATIVE
PH UA: 5.5 (ref 5.0–7.5)
Specific Gravity, UA: >1.03 — ABNORMAL HIGH (ref 1.005–1.030)
UUROB: 0.2 mg/dL (ref 0.2–1.0)

## 2015-04-22 LAB — MICROSCOPIC EXAMINATION
BACTERIA UA: NONE SEEN
Epithelial Cells (non renal): NONE SEEN /hpf (ref 0–10)
RBC, UA: NONE SEEN /hpf (ref 0–?)

## 2015-04-22 MED ORDER — LIDOCAINE HCL 2 % EX GEL
1.0000 "application " | Freq: Once | CUTANEOUS | Status: AC
  Administered 2015-04-22: 1 via URETHRAL

## 2015-04-22 MED ORDER — SODIUM BICARBONATE 8.4 % IV SOLN
11.0000 mL | Freq: Once | INTRAVENOUS | Status: AC
  Administered 2015-04-22: 11 mL

## 2015-04-22 MED ORDER — DOXYCYCLINE HYCLATE 100 MG PO TABS: 100.0000 mg | ORAL_TABLET | Freq: Two times a day (BID) | ORAL | Status: DC

## 2015-04-22 NOTE — Progress Notes (Signed)
Bladder Rescue Solution Instillation Due to interstitial cystitis patient is present today for a Rescue Solution Treatment.  Patient was cleaned and prepped in a sterile fashion with betadine and lidocaine 2% jelly was instilled into the urethra.  A 14 FR catheter was inserted, urine return was noted 21ml, urine was light yellow in color.  Instilled a solution consisting of 13ml of Sodium Bicarb, 2 ml Lidocaine and 1 ml of Heparin. The catheter was then removed. Patient tolerated well, no complications were noted.   Performed by: Lyndee Hensen CMA  Follow up/ Additional Notes: One week

## 2015-04-22 NOTE — Progress Notes (Signed)
Name: [name]   MRN: [id number]    DOB: 01/14/1930   Date:04/22/2015       Progress Note  Subjective  Chief Complaint  Chief Complaint  Patient presents with  . Cellulitis    bridge of nose    HPI  [name] is a 81 year old female with a complex medical history here today with acute concern. About 1 week ago she started noticing a pimple on the middle of her nose and her glasses sit at that area on her nose and irritated it and now it has grown to be more tender. Not associated with fevers, chills, cough, sinus pressure, headaches, nausea, vomiting, lethargy, drainage. She has not tried any home remedies for this condition.   Active Ambulatory Problems    Diagnosis Date Noted  . DM (diabetes mellitus) type II controlled, neurological manifestation 06/26/2010  . Depression, major, recurrent, in partial remission 06/26/2010  . Hypertension goal BP (blood pressure) < 150/90 06/01/2009  . Frequent falls 06/26/2010  . H/O paroxysmal supraventricular tachycardia 06/01/2009  . Stricture and stenosis of esophagus 06/26/2010  . Gastro-esophageal reflux disease without esophagitis 06/26/2010  . Irritable bowel syndrome with constipation and diarrhea 06/16/2008  . Chronic interstitial cystitis without hematuria 06/26/2010  . Fibromyalgia 06/26/2010  . Diarrhea, functional 06/16/2008  . Personal history of malignant neoplasm of breast, known active 06/26/2010  . Dysuria 04/01/2015  . Recurrent UTI 04/09/2015  . Anxiety and depression 01/12/2014  . Carpal tunnel syndrome 01/12/2014  . Chronic interstitial cystitis 06/26/2010  . D (diarrhea) 06/16/2008  . DD (diverticular disease) 01/12/2014  . Carcinoma in situ, breast, ductal 01/12/2014  . Acid reflux 06/26/2010  . Essential (primary) hypertension 01/12/2014  . H/O surgical procedure 01/12/2014  . H/O right mastectomy 01/12/2014  . Hypercholesteremia 01/12/2014  . Adaptive colitis 06/16/2008  . DS (disseminated sclerosis)  01/12/2014  . Arthritis, degenerative 01/12/2014  . Paroxysmal supraventricular tachycardia 06/01/2009  . H/O malignant neoplasm of breast 06/26/2010  . Excessive falling 01/13/2014  . Episode of syncope 03/03/2014  . Temporary cerebral vascular dysfunction 01/12/2014  . Thyroid nodule 01/12/2014  . Cyanocobalamine deficiency (non anemic) 01/12/2014  . Need for immunization against influenza 04/15/2015  . Visual changes 04/15/2015  . Boil, face 04/22/2015   Resolved Ambulatory Problems    Diagnosis Date Noted  . Abdominal pain, left lower quadrant 06/16/2008  . Medicare annual wellness visit, subsequent 02/21/2015  . IC (interstitial cystitis) 04/09/2015  . Abdominal pain, left lower quadrant 06/16/2008  . Bladder infection, chronic 01/12/2014  . Diabetes 01/12/2014  . Depression, major, single episode 06/26/2010  . Diabetes mellitus, type 2 06/26/2010  . Head revolving around 01/12/2014   Past Medical History  Diagnosis Date  . GERD (gastroesophageal reflux disease)   . Vertigo   . Essential hypertension, benign   . Pure hypercholesterolemia   . MS (multiple sclerosis)   . Diverticulosis   . Diabetes mellitus without complication   . Osteoarthritis   . Dyslipidemia   . SOB (shortness of breath)   . Chest pain   . Peripheral neuropathy   . Cancer   . Depression   . History of fractured rib   . Chronic right hip pain   . History of TIA (transient ischemic attack)   . Hearing loss of left ear   . Vitamin B 12 deficiency   . Protein malnutrition   . Irritable bowel      Social History  Substance Use Topics  . Smoking  status: Never Smoker   . Smokeless tobacco: Never Used  . Alcohol Use: No     Current outpatient prescriptions:  .  acetic acid-aluminum acetate (DOMEBORO OTIC) 2 % otic solution, every 3 (three) hours., Disp: , Rfl:  .  amoxicillin-clavulanate (AUGMENTIN) 875-125 MG per tablet, Take 1 tablet by mouth every 12 (twelve) hours., Disp: 14 tablet,  Rfl: 0 .  Cholecalciferol (VITAMIN D-1000 MAX ST) 1000 UNITS tablet, Take by mouth., Disp: , Rfl:  .  cyanocobalamin ([employer] VITAMIN B12) 2000 MCG tablet, Take by mouth., Disp: , Rfl:  .  dicyclomine (BENTYL) 10 MG capsule, TAKE 1 CAPSULE (10 MG TOTAL) BY MOUTH EVERY 8 (EIGHT) HOURS AS NEEDED., Disp: , Rfl: 1 .  ferrous fumarate (HEMOCYTE - 106 MG FE) 325 (106 FE) MG TABS tablet, Take 1 tablet by mouth., Disp: , Rfl:  .  glucose blood test strip, 1 each by Other route as needed for other. Use as instructed, Disp: , Rfl:  .  meclizine (ANTIVERT) 25 MG tablet, Take 25 mg by mouth 3 (three) times daily as needed for dizziness., Disp: , Rfl:  .  metFORMIN (GLUCOPHAGE) 500 MG tablet, Take by mouth., Disp: , Rfl:  .  Meth-Hyo-M Bl-Na Phos-Ph Sal (URIBEL) 118 MG CAPS, One tablet four times daily as needed with 8 oz of water, Disp: 25 capsule, Rfl: 3 .  Methenamine-Sodium Salicylate (CYSTEX) 941-740.8 MG TABS, Take by mouth., Disp: , Rfl:  .  omeprazole (PRILOSEC) 20 MG capsule, Take 20 mg by mouth daily., Disp: , Rfl:  .  oxyCODONE-acetaminophen (PERCOCET) 7.5-325 MG per tablet, Take 1 tablet by mouth every 4 (four) hours as needed for severe pain., Disp: , Rfl:  .  phenazopyridine (PYRIDIUM) 95 MG tablet, Take 95 mg by mouth 3 (three) times daily as needed for pain., Disp: , Rfl:  .  Probiotic Product (PROBIOTIC COLON SUPPORT) CAPS, Take by mouth., Disp: , Rfl:  .  rosuvastatin (CRESTOR) 5 MG tablet, Take 2.5 mg by mouth. , Disp: , Rfl:  .  sertraline (ZOLOFT) 50 MG tablet, Take by mouth., Disp: , Rfl:   Past Surgical History  Procedure Laterality Date  . Total abdominal hysterectomy w/ bilateral salpingoophorectomy    . Mastectomy Right     DX MASTITIS/NO CANCER.Marland KitchenSALINE IMPLANT  . Cholecystectomy    . Hemorrhoid surgery      WITH RECONSTRUCTION  . Bladder tacking      X 3  . Eye surgeries      WITH BUCKLE DETACHMENT OF THE RETINA  . Tonsillectomy    . Appendectomy    . Tympanoplasty    .  Breast surgery    . Mastectomy    . Breast lumpectomy    . Colonoscopy with propofol N/A 01/21/2015    Procedure: COLONOSCOPY WITH PROPOFOL;  Surgeon: Josefine Class, MD;  Location: [facility] ENDOSCOPY;  Service: Endoscopy;  Laterality: N/A;  . Esophagogastroduodenoscopy N/A 01/21/2015    Procedure: ESOPHAGOGASTRODUODENOSCOPY (EGD);  Surgeon: Josefine Class, MD;  Location: [facility] ENDOSCOPY;  Service: Endoscopy;  Laterality: N/A;    Family History  Problem Relation Age of Onset  . Arthritis Sister   . Aneurysm Brother   . Heart disease Daughter   . ALS Mother   . Colon cancer    . Kidney disease Neg Hx     Allergies  Allergen Reactions  . Biaxin [Clarithromycin] Other (See Comments) and Diarrhea  . Copaxone [Glatiramer Acetate]   . Decadron [Dexamethasone] Nausea And Vomiting and Other (  See Comments)    Other reaction(s): Muscle Pain Reaction:  Abdominal pain  . Diltiazem Hcl Other (See Comments)    Reaction:  Unknown   . Diltiazem Hcl Other (See Comments)  . Flagyl [Metronidazole] Nausea Only and Diarrhea    Other reaction(s): Vomiting  . Gabapentin Other (See Comments)    "burning all over" Reaction:  Unknown   . Iohexol Swelling and Other (See Comments)     Desc: tongue swelling with "IVP dye" sccording to nurses notes with a lumbar myelo-12/09- asm, Onset Date: [id number]  Pts tongue swells.  Clementeen Hoof [Iodinated Diagnostic Agents] Swelling and Other (See Comments)    Passed out  Pts tongue swells.   . Levothyroxine Nausea Only  . Sulfonamide Derivatives Other (See Comments)    Reaction:  Unknown   . Synthroid [Levothyroxine Sodium]   . Copaxone  [Glatiramer] Rash  . Interferon Beta-1a Other (See Comments) and Rash    Reaction:  Unknown      Review of Systems  CONSTITUTIONAL: No significant weight changes, fever, chills, weakness or fatigue.  HEENT:  - Eyes: No visual changes.  - Ears: No auditory changes. No pain.  - Nose: No sneezing, congestion, runny  nose. - Throat: No sore throat. No changes in swallowing. SKIN: Yes rash. CARDIOVASCULAR: No chest pain, chest pressure or chest discomfort. No palpitations or edema.  RESPIRATORY: No shortness of breath, cough or sputum.  GENITOURINARY: No dysuria. No frequency. No discharge.  NEUROLOGICAL: No headache, dizziness, syncope, paralysis, ataxia, numbness or tingling in the extremities. No memory changes. No change in bowel or bladder control.   LYMPHATICS: No enlarged lymph nodes.  PSYCHIATRIC: No change in mood. No change in sleep pattern.      Objective  BP 138/86 mmHg  Pulse 91  Temp(Src) 98.3 F (36.8 C) (Oral)  Resp 16  Wt 154 lb 6.4 oz (70.035 kg)  SpO2 94% Body mass index is 27.36 kg/(m^2).  Physical Exam  Constitutional: Patient appears well-developed and well-nourished. In no distress.  HEENT:  - Head: Normocephalic and atraumatic.  - Ears: Bilateral TMs gray, no erythema or effusion - Nose: Nasal mucosa moist. 0.25 cm diameter raised red tender lesion over midline bony part of nose without surround skin induration or erythema.  - Mouth/Throat: Oropharynx is clear and moist. No tonsillar hypertrophy or erythema. No post nasal drainage.  - Eyes: Conjunctivae clear, EOM movements normal. PERRLA. No scleral icterus.  Neck: Normal range of motion. Neck supple. No JVD present. No thyromegaly present.  Cardiovascular: Normal rate, regular rhythm and normal heart sounds.  No murmur heard.  Pulmonary/Chest: Effort normal and breath sounds normal. No respiratory distress. Psychiatric: Patient has a stable mood and affect. Behavior is normal in office today. Judgment and thought content normal in office today.   Assessment & Plan  1. Boil, face Keep area well cleaned and avoid wearing glasses when not needed, and when wearing them wear down down further on the nose away from lesion. Uncomplicated boil. Will start her on doxycycline 100 mg po bid along with topical triple abx.    - doxycycline (VIBRA-TABS) 100 MG tablet; Take 1 tablet (100 mg total) by mouth 2 (two) times daily.  Dispense: 20 tablet; Refill: 0

## 2015-04-22 NOTE — Progress Notes (Signed)
04/22/2015 9:16 PM   [name] 21-Oct-1929 [id number]  Referring Armonie Staten: Bobetta Lime, MD [address] [place], [place] [address]  Chief Complaint  Patient presents with  . Cystitis    Rescue soluton    HPI:  Patient is an 81 year old white female with a history of interstitial cystitis who presents today for rescue installation.  She has no urinary complaints today. She denies hematuria, fever, chills, nausea or vomiting. Her UA today is unremarkable.  She did not experience difficulty with her last rescue solution.   PMH: Past Medical History  Diagnosis Date  . GERD (gastroesophageal reflux disease)   . Vertigo   . Essential hypertension, benign   . Paroxysmal supraventricular tachycardia   . Pure hypercholesterolemia   . MS (multiple sclerosis)   . Fibromyalgia   . Diverticulosis   . Diabetes mellitus without complication   . Osteoarthritis   . Dyslipidemia   . SOB (shortness of breath)     SECONDARY TO REFLUX  . Chest pain     SECONDARY TO GERD  . Peripheral neuropathy     MILD  . Cancer     breast  . Depression   . Chronic interstitial cystitis   . History of fractured rib   . Chronic right hip pain   . History of TIA (transient ischemic attack)   . Carpal tunnel syndrome   . Thyroid nodule   . Hearing loss of left ear   . Vitamin B 12 deficiency   . Protein malnutrition   . Frequent falls   . Anxiety and depression   . Irritable bowel   . Recurrent UTI     Surgical History: Past Surgical History  Procedure Laterality Date  . Total abdominal hysterectomy w/ bilateral salpingoophorectomy    . Mastectomy Right     DX MASTITIS/NO CANCER.Marland KitchenSALINE IMPLANT  . Cholecystectomy    . Hemorrhoid surgery      WITH RECONSTRUCTION  . Bladder tacking      X 3  . Eye surgeries      WITH BUCKLE DETACHMENT OF THE RETINA  . Tonsillectomy    . Appendectomy    . Tympanoplasty    . Breast surgery    . Mastectomy    . Breast lumpectomy     . Colonoscopy with propofol N/A 01/21/2015    Procedure: COLONOSCOPY WITH PROPOFOL;  Surgeon: Josefine Class, MD;  [facility]: [facility] ENDOSCOPY;  Service: Endoscopy;  Laterality: N/A;  . Esophagogastroduodenoscopy N/A 01/21/2015    Procedure: ESOPHAGOGASTRODUODENOSCOPY (EGD);  Surgeon: Josefine Class, MD;  Location: [facility] ENDOSCOPY;  Service: Endoscopy;  Laterality: N/A;    Home Medications:    Medication List       This list is accurate as of: 04/22/15 11:59 PM.  Always use your most recent med list.               acetic acid-aluminum acetate 2 % otic solution  Commonly known as:  DOMEBORO OTIC  every 3 (three) hours.     CRESTOR 5 MG tablet  Generic drug:  rosuvastatin  Take 2.5 mg by mouth.     [employer] VITAMIN B12 2000 MCG tablet  Generic drug:  cyanocobalamin  Take by mouth.     CYSTEX 162-162.5 MG Tabs  Generic drug:  Methenamine-Sodium Salicylate  Take by mouth.     dicyclomine 10 MG capsule  Commonly known as:  BENTYL  TAKE 1 CAPSULE (10 MG TOTAL) BY MOUTH EVERY 8 (EIGHT)  HOURS AS NEEDED.     doxycycline 100 MG tablet  Commonly known as:  VIBRA-TABS  Take 1 tablet (100 mg total) by mouth 2 (two) times daily.     ferrous fumarate 325 (106 FE) MG Tabs tablet  Commonly known as:  HEMOCYTE - 106 mg FE  Take 1 tablet by mouth.     glucose blood test strip  1 each by Other route as needed for other. Use as instructed     meclizine 25 MG tablet  Commonly known as:  ANTIVERT  Take 25 mg by mouth 3 (three) times daily as needed for dizziness.     metFORMIN 500 MG tablet  Commonly known as:  GLUCOPHAGE  Take by mouth.     omeprazole 20 MG capsule  Commonly known as:  PRILOSEC  Take 20 mg by mouth daily.     oxyCODONE-acetaminophen 7.5-325 MG per tablet  Commonly known as:  PERCOCET  Take 1 tablet by mouth every 4 (four) hours as needed for severe pain.     phenazopyridine 95 MG tablet  Commonly known as:  PYRIDIUM  Take 95 mg by mouth 3 (three) times  daily as needed for pain.     PROBIOTIC COLON SUPPORT Caps  Take by mouth.     sertraline 50 MG tablet  Commonly known as:  ZOLOFT  Take by mouth.     URIBEL 118 MG Caps  One tablet four times daily as needed with 8 oz of water     VITAMIN D-1000 MAX ST 1000 UNITS tablet  Generic drug:  Cholecalciferol  Take by mouth.        Allergies:  Allergies  Allergen Reactions  . Biaxin [Clarithromycin] Other (See Comments) and Diarrhea  . Copaxone [Glatiramer Acetate]   . Decadron [Dexamethasone] Nausea And Vomiting and Other (See Comments)    Other reaction(s): Muscle Pain Reaction:  Abdominal pain  . Diltiazem Hcl Other (See Comments)    Reaction:  Unknown   . Diltiazem Hcl Other (See Comments)  . Flagyl [Metronidazole] Nausea Only and Diarrhea    Other reaction(s): Vomiting  . Gabapentin Other (See Comments)    "burning all over" Reaction:  Unknown   . Iohexol Swelling and Other (See Comments)     Desc: tongue swelling with "IVP dye" sccording to nurses notes with a lumbar myelo-12/09- asm, Onset Date: [id number]  Pts tongue swells.  Clementeen Hoof [Iodinated Diagnostic Agents] Swelling and Other (See Comments)    Passed out  Pts tongue swells.   . Levothyroxine Nausea Only  . Sulfonamide Derivatives Other (See Comments)    Reaction:  Unknown   . Synthroid [Levothyroxine Sodium]   . Copaxone  [Glatiramer] Rash  . Interferon Beta-1a Other (See Comments) and Rash    Reaction:  Unknown     Family History: Family History  Problem Relation Age of Onset  . Arthritis Sister   . Aneurysm Brother   . Heart disease Daughter   . ALS Mother   . Colon cancer    . Kidney disease Neg Hx     Social History:  reports that she has never smoked. She has never used smokeless tobacco. She reports that she does not drink alcohol or use illicit drugs.  ROS: UROLOGY Frequent Urination?: Yes Hard to postpone urination?: Yes Burning/pain with urination?: Yes Get up at night to urinate?:  Yes Leakage of urine?: Yes Urine stream starts and stops?: No Trouble starting stream?: No Do you have to strain to  urinate?: No Blood in urine?: No Urinary tract infection?: No Sexually transmitted disease?: No Injury to kidneys or bladder?: No Painful intercourse?: No Weak stream?: No Currently pregnant?: No Vaginal bleeding?: No Last menstrual period?: n  Gastrointestinal Nausea?: Yes Vomiting?: No Indigestion/heartburn?: No Diarrhea?: Yes Constipation?: No  Constitutional Fever: No Night sweats?: No Weight loss?: No Fatigue?: Yes  Skin Skin rash/lesions?: Yes Itching?: No  Eyes Blurred vision?: Yes Double vision?: No  Ears/Nose/Throat Sore throat?: No Sinus problems?: Yes  Hematologic/Lymphatic Swollen glands?: No Easy bruising?: No  Cardiovascular Leg swelling?: Yes Chest pain?: No  Respiratory Cough?: No Shortness of breath?: No  Endocrine Excessive thirst?: No  Musculoskeletal Back pain?: Yes Joint pain?: Yes  Neurological Headaches?: Yes Dizziness?: Yes  Psychologic Depression?: Yes Anxiety?: Yes  Physical Exam: BP 166/82 mmHg  Pulse 78  Ht 5' 3"  (1.6 m)  Wt 154 lb (69.854 kg)  BMI 27.29 kg/m2   Laboratory Data: Lab Results  Component Value Date   WBC 4.7 04/18/2015   HGB 9.7* 01/06/2015   HCT 39.9 04/18/2015   MCV 75.6* 01/06/2015   PLT 207 01/06/2015   Lab Results  Component Value Date   CREATININE 1.03* 04/18/2015   Lab Results  Component Value Date   HGBA1C 6.5 04/15/2015   Urinalysis Results for orders placed or performed in visit on 04/22/15  Microscopic Examination  Result Value Ref Range   WBC, UA 0-5 0 -  5 /hpf   RBC, UA None seen 0 -  2 /hpf   Epithelial Cells (non renal) None seen 0 - 10 /hpf   Mucus, UA Present (A) Not Estab.   Bacteria, UA None seen None seen/Few  Urinalysis, Complete  Result Value Ref Range   Specific Gravity, UA >1.030 (H) 1.005 - 1.030   pH, UA 5.5 5.0 - 7.5   Color, UA  Green (A) Yellow   Appearance Ur Clear Clear   Leukocytes, UA Trace (A) Negative   Protein, UA 2+ (A) Negative/Trace   Glucose, UA Negative Negative   Ketones, UA Negative Negative   RBC, UA Trace (A) Negative   Bilirubin, UA Positive (A) Negative   Urobilinogen, Ur 0.2 0.2 - 1.0 mg/dL   Nitrite, UA Negative Negative   Microscopic Examination See below:      Assessment & Plan:    1. Interstitial cystitis:   Patient had a rescue solutions instilled today. She tolerated the procedure well. She will return next week for another rescue solution installation.   - Urinalysis, Complete - heparin-lidocaine-sod bicarb bladder irrigation (Bladder rescue for bladder instillation procedures); Irrigate with 11 mLs as directed once.    Return in about 1 week (around 04/29/2015) for rescue solution.  Zara Council, [facility] [address] [place],  [address] [phone]

## 2015-04-25 DIAGNOSIS — N3010 Interstitial cystitis (chronic) without hematuria: Secondary | ICD-10-CM | POA: Insufficient documentation

## 2015-04-29 ENCOUNTER — Encounter: Payer: Self-pay | Admitting: Urology

## 2015-04-29 ENCOUNTER — Ambulatory Visit (INDEPENDENT_AMBULATORY_CARE_PROVIDER_SITE_OTHER): Payer: Commercial Managed Care - HMO | Admitting: Urology

## 2015-04-29 VITALS — BP 180/83 | HR 77 | Ht 63.0 in | Wt 154.9 lb

## 2015-04-29 DIAGNOSIS — N3010 Interstitial cystitis (chronic) without hematuria: Secondary | ICD-10-CM | POA: Diagnosis not present

## 2015-04-29 DIAGNOSIS — R197 Diarrhea, unspecified: Secondary | ICD-10-CM

## 2015-04-29 DIAGNOSIS — R319 Hematuria, unspecified: Secondary | ICD-10-CM

## 2015-04-29 DIAGNOSIS — N390 Urinary tract infection, site not specified: Secondary | ICD-10-CM

## 2015-04-29 LAB — URINALYSIS, COMPLETE
BILIRUBIN UA: NEGATIVE
GLUCOSE, UA: NEGATIVE
Ketones, UA: NEGATIVE
Nitrite, UA: NEGATIVE
PH UA: 5.5 (ref 5.0–7.5)
Specific Gravity, UA: 1.025 (ref 1.005–1.030)
Urobilinogen, Ur: 0.2 mg/dL (ref 0.2–1.0)

## 2015-04-29 LAB — MICROSCOPIC EXAMINATION

## 2015-04-29 NOTE — Progress Notes (Signed)
04/29/2015 2:23 PM   [name] [birth year] [id number]  Referring provider: Bobetta Lime, MD [address] [place], [place] [address]  Chief Complaint  Patient presents with  . Interstitial cystitis    Rescue solution    HPI: Patient is an 6 year white female who presents today for a rescue solution, but she is having diarrhea and dizziness for the last week.  She feels she is getting better, meaning the dizziness is improving.  She is still having bouts of alternating diarrhea and constipation.  She is going to be evaluated by GI next week.    She has been having fevers, chills, nausea and vomiting over the last week.  She states she is not feverish now and the vomiting has ended.     PMH: Past Medical History  Diagnosis Date  . GERD (gastroesophageal reflux disease)   . Vertigo   . Essential hypertension, benign   . Paroxysmal supraventricular tachycardia   . Pure hypercholesterolemia   . MS (multiple sclerosis)   . Fibromyalgia   . Diverticulosis   . Diabetes mellitus without complication   . Osteoarthritis   . Dyslipidemia   . SOB (shortness of breath)     SECONDARY TO REFLUX  . Chest pain     SECONDARY TO GERD  . Peripheral neuropathy     MILD  . Cancer     breast  . Depression   . Chronic interstitial cystitis   . History of fractured rib   . Chronic right hip pain   . History of TIA (transient ischemic attack)   . Carpal tunnel syndrome   . Thyroid nodule   . Hearing loss of left ear   . Vitamin B 12 deficiency   . Protein malnutrition   . Frequent falls   . Anxiety and depression   . Irritable bowel   . Recurrent UTI     Surgical History: Past Surgical History  Procedure Laterality Date  . Total abdominal hysterectomy w/ bilateral salpingoophorectomy    . Mastectomy Right     DX MASTITIS/NO CANCER.Marland KitchenSALINE IMPLANT  . Cholecystectomy    . Hemorrhoid surgery      WITH RECONSTRUCTION  . Bladder tacking      X 3  . Eye  surgeries      WITH BUCKLE DETACHMENT OF THE RETINA  . Tonsillectomy    . Appendectomy    . Tympanoplasty    . Breast surgery    . Mastectomy    . Breast lumpectomy    . Colonoscopy with propofol N/A 01/21/2015    Procedure: COLONOSCOPY WITH PROPOFOL;  Surgeon: Josefine Class, MD;  Location[facility] ENDOSCOPY;  Service: Endoscopy;  Laterality: N/A;  . Esophagogastroduodenoscopy N/A 01/21/2015    Procedure: ESOPHAGOGASTRODUODENOSCOPY (EGD);  Surgeon: Josefine Class, MD;  Location: [facility] ENDOSCOPY;  Service: Endoscopy;  Laterality: N/A;    Home Medications:    Medication List       This list is accurate as of: 04/29/15 11:59 PM.  Always use your most recent med list.               acetic acid-aluminum acetate 2 % otic solution  Commonly known as:  DOMEBORO OTIC  every 3 (three) hours.     CRESTOR 5 MG tablet  Generic drug:  rosuvastatin  Take 2.5 mg by mouth.     CVS VITAMIN B12 2000 MCG tablet  Generic drug:  cyanocobalamin  Take by mouth.  CYSTEX 162-162.5 MG Tabs  Generic drug:  Methenamine-Sodium Salicylate  Take by mouth.     dicyclomine 10 MG capsule  Commonly known as:  BENTYL  TAKE 1 CAPSULE (10 MG TOTAL) BY MOUTH EVERY 8 (EIGHT) HOURS AS NEEDED.     doxycycline 100 MG tablet  Commonly known as:  VIBRA-TABS  Take 1 tablet (100 mg total) by mouth 2 (two) times daily.     ferrous fumarate 325 (106 FE) MG Tabs tablet  Commonly known as:  HEMOCYTE - 106 mg FE  Take 1 tablet by mouth.     glucose blood test strip  1 each by Other route as needed for other. Use as instructed     meclizine 25 MG tablet  Commonly known as:  ANTIVERT  Take 25 mg by mouth 3 (three) times daily as needed for dizziness.     metFORMIN 500 MG tablet  Commonly known as:  GLUCOPHAGE  Take by mouth.     omeprazole 20 MG capsule  Commonly known as:  PRILOSEC  Take 20 mg by mouth daily.     oxyCODONE-acetaminophen 7.5-325 MG tablet  Commonly known as:  PERCOCET  Take 1  tablet by mouth every 4 (four) hours as needed for severe pain.     phenazopyridine 95 MG tablet  Commonly known as:  PYRIDIUM  Take 95 mg by mouth 3 (three) times daily as needed for pain.     PROBIOTIC COLON SUPPORT Caps  Take by mouth.     sertraline 50 MG tablet  Commonly known as:  ZOLOFT  Take by mouth.     URIBEL 118 MG Caps  One tablet four times daily as needed with 8 oz of water     VITAMIN D-1000 MAX ST 1000 UNITS tablet  Generic drug:  Cholecalciferol  Take by mouth.        Allergies:  Allergies  Allergen Reactions  . Biaxin [Clarithromycin] Other (See Comments) and Diarrhea  . Copaxone [Glatiramer Acetate]   . Decadron [Dexamethasone] Nausea And Vomiting and Other (See Comments)    Other reaction(s): Muscle Pain Reaction:  Abdominal pain  . Diltiazem Hcl Other (See Comments)    Reaction:  Unknown   . Diltiazem Hcl Other (See Comments)  . Flagyl [Metronidazole] Nausea Only and Diarrhea    Other reaction(s): Vomiting  . Gabapentin Other (See Comments)    "burning all over" Reaction:  Unknown   . Iohexol Swelling and Other (See Comments)     Desc: tongue swelling with "IVP dye" sccording to nurses notes with a lumbar myelo-12/09- asm, Onset Date: [id number]  Pts tongue swells.  Clementeen Hoof [Iodinated Diagnostic Agents] Swelling and Other (See Comments)    Passed out  Pts tongue swells.   . Levothyroxine Nausea Only  . Sulfonamide Derivatives Other (See Comments)    Reaction:  Unknown   . Synthroid [Levothyroxine Sodium]   . Copaxone  [Glatiramer] Rash  . Interferon Beta-1a Other (See Comments) and Rash    Reaction:  Unknown     Family History: Family History  Problem Relation Age of Onset  . Arthritis Sister   . Aneurysm Brother   . Heart disease Daughter   . ALS Mother   . Colon cancer    . Kidney disease Neg Hx     Social History:  reports that she has never smoked. She has never used smokeless tobacco. She reports that she does not drink  alcohol or use illicit drugs.  ROS: UROLOGY  Frequent Urination?: Yes Hard to postpone urination?: No Burning/pain with urination?: No Get up at night to urinate?: Yes Leakage of urine?: Yes Urine stream starts and stops?: No Trouble starting stream?: No Do you have to strain to urinate?: No Blood in urine?: Yes Urinary tract infection?: No Sexually transmitted disease?: No Injury to kidneys or bladder?: No Painful intercourse?: No Weak stream?: No Currently pregnant?: No Vaginal bleeding?: No Last menstrual period?: n  Gastrointestinal Nausea?: Yes Vomiting?: Yes Indigestion/heartburn?: Yes Diarrhea?: Yes Constipation?: Yes  Constitutional Fever: Yes Night sweats?: Yes Weight loss?: No Fatigue?: Yes  Skin Skin rash/lesions?: No Itching?: No  Eyes Blurred vision?: Yes Double vision?: No  Ears/Nose/Throat Sore throat?: No Sinus problems?: Yes  Hematologic/Lymphatic Swollen glands?: No Easy bruising?: No  Cardiovascular Leg swelling?: Yes Chest pain?: No  Respiratory Cough?: No Shortness of breath?: No  Endocrine Excessive thirst?: No  Musculoskeletal Back pain?: Yes Joint pain?: Yes  Neurological Headaches?: Yes Dizziness?: Yes  Psychologic Depression?: Yes Anxiety?: Yes  Physical Exam: BP 180/83 mmHg  Pulse 77  Ht 5' 3"  (1.6 m)  Wt 154 lb 14.4 oz (70.262 kg)  BMI 27.45 kg/m2   Laboratory Data: Lab Results  Component Value Date   WBC 4.7 04/18/2015   HGB 9.7* 01/06/2015   HCT 39.9 04/18/2015   MCV 75.6* 01/06/2015   PLT 207 01/06/2015   Lab Results  Component Value Date   CREATININE 1.03* 04/18/2015   Lab Results  Component Value Date   HGBA1C 6.5 04/15/2015   Urinalysis Results for orders placed or performed in visit on 04/29/15  CULTURE, URINE COMPREHENSIVE  Result Value Ref Range   Urine Culture, Comprehensive Final report    Result 1 Comment   Clostridium Difficile by PCR  Result Value Ref Range   Toxigenic C  Difficile by pcr Negative Negative  Microscopic Examination  Result Value Ref Range   WBC, UA 6-10 (A) 0 -  5 /hpf   RBC, UA 3-10 (A) 0 -  2 /hpf   Epithelial Cells (non renal) 0-10 0 - 10 /hpf   Renal Epithel, UA 0-10 (A) None seen /hpf   Mucus, UA Present (A) Not Estab.   Bacteria, UA Few (A) None seen/Few  Urinalysis, Complete  Result Value Ref Range   Specific Gravity, UA 1.025 1.005 - 1.030   pH, UA 5.5 5.0 - 7.5   Color, UA Yellow Yellow   Appearance Ur Clear Clear   Leukocytes, UA 1+ (A) Negative   Protein, UA Trace (A) Negative/Trace   Glucose, UA Negative Negative   Ketones, UA Negative Negative   RBC, UA 2+ (A) Negative   Bilirubin, UA Negative Negative   Urobilinogen, Ur 0.2 0.2 - 1.0 mg/dL   Nitrite, UA Negative Negative   Microscopic Examination See below:      Assessment & Plan:    1. Interstitial cystitis:   We will postpone her rescue solution for today.    - Urinalysis, Complete  2. UTI with microscopic hematura:   Patient's UA is suspicious for infection.  I will send it for urine culture.  She is having explosive diarrhea alternating with constipation, so I'll postpone prescribing an antibiotic empirically.  3. Explosive diarrhea:  Patient has been having explosive diarrhea alternating with constipation. I will send her stool for C. difficile culture. She has an upcoming appointment in a week with GI for further evaluation.  She is to seek treatment in the ER if she should become weak or become severely  dehydrated.  Return for pending labs.  Zara Council, [facility] [address], [place], [place] [address] [phone]

## 2015-04-30 ENCOUNTER — Other Ambulatory Visit: Payer: Self-pay | Admitting: Family Medicine

## 2015-04-30 LAB — CLOSTRIDIUM DIFFICILE BY PCR: Toxigenic C. Difficile by PCR: NEGATIVE

## 2015-05-02 ENCOUNTER — Telehealth: Payer: Self-pay

## 2015-05-02 DIAGNOSIS — N390 Urinary tract infection, site not specified: Secondary | ICD-10-CM | POA: Insufficient documentation

## 2015-05-02 DIAGNOSIS — R197 Diarrhea, unspecified: Secondary | ICD-10-CM | POA: Insufficient documentation

## 2015-05-02 LAB — CULTURE, URINE COMPREHENSIVE

## 2015-05-02 NOTE — Telephone Encounter (Signed)
LMOM- urine cx and  C.diff both negative.

## 2015-05-02 NOTE — Telephone Encounter (Signed)
-----   Message from Nori Riis, PA-C sent at 05/01/2015  8:06 PM EDT ----- Please let the patient know her C.dif is negative.

## 2015-05-03 DIAGNOSIS — D509 Iron deficiency anemia, unspecified: Secondary | ICD-10-CM | POA: Diagnosis not present

## 2015-05-06 ENCOUNTER — Ambulatory Visit (INDEPENDENT_AMBULATORY_CARE_PROVIDER_SITE_OTHER): Payer: Commercial Managed Care - HMO | Admitting: Urology

## 2015-05-06 ENCOUNTER — Encounter: Payer: Self-pay | Admitting: Urology

## 2015-05-06 VITALS — BP 173/92 | HR 73 | Resp 16 | Ht 62.0 in | Wt 154.5 lb

## 2015-05-06 DIAGNOSIS — N3010 Interstitial cystitis (chronic) without hematuria: Secondary | ICD-10-CM | POA: Diagnosis not present

## 2015-05-06 DIAGNOSIS — R1030 Lower abdominal pain, unspecified: Secondary | ICD-10-CM

## 2015-05-06 MED ORDER — SODIUM BICARBONATE 8.4 % IV SOLN
11.0000 mL | Freq: Once | INTRAVENOUS | Status: AC
  Administered 2015-05-06: 11 mL

## 2015-05-06 NOTE — Progress Notes (Signed)
Bladder Rescue Solution Instillation  Due to interstitial cystitis patient is present today for a Rescue Solution Treatment.  Patient was cleaned and prepped in a sterile fashion with betadine and lidocaine 2% jelly was instilled into the urethra.  A 14 FR catheter was inserted, urine return was noted 31ml, urine was yellow in color.  Instilled a solution consisting of 46ml of Sodium Bicarb, 2 ml Lidocaine and 1 ml of Heparin. The catheter was then removed. Patient tolerated well, no complications were noted. Patient was initially cathed to get urine sample to ensure clean catch specimen before procedure.  Performed by: Lyndee Hensen CMA

## 2015-05-08 DIAGNOSIS — R102 Pelvic and perineal pain: Secondary | ICD-10-CM | POA: Insufficient documentation

## 2015-05-08 NOTE — Progress Notes (Signed)
8:29 AM   [name] 21-Jul-1930 [id number]  Referring provider: Bobetta Lime, MD [address] [place], [place] [address]  Chief Complaint  Patient presents with  . Interstitial Cystitis    Cath UA and rescue solution    HPI:  Patient is an 81 year old white female with a history of interstitial cystitis who presents today for rescue installation.  She has no urinary complaints today. She denies hematuria, fever, chills, nausea or vomiting. Her UA today is unremarkable.    She did not receive her last rescue solution due to having diarrhea and dizziness. Her C. difficile culture was negative and her urine culture were negative from that visit.    She is not having diarrhea or dizziness today, but she is having suprapubic pain and dysuria. She denied any gross hematuria.  Upon review of her past records, she has not undergone a cystoscopic examination.  PMH: Past Medical History  Diagnosis Date  . GERD (gastroesophageal reflux disease)   . Vertigo   . Essential hypertension, benign   . Paroxysmal supraventricular tachycardia   . Pure hypercholesterolemia   . MS (multiple sclerosis)   . Fibromyalgia   . Diverticulosis   . Diabetes mellitus without complication   . Osteoarthritis   . Dyslipidemia   . SOB (shortness of breath)     SECONDARY TO REFLUX  . Chest pain     SECONDARY TO GERD  . Peripheral neuropathy     MILD  . Cancer     breast  . Depression   . Chronic interstitial cystitis   . History of fractured rib   . Chronic right hip pain   . History of TIA (transient ischemic attack)   . Carpal tunnel syndrome   . Thyroid nodule   . Hearing loss of left ear   . Vitamin B 12 deficiency   . Protein malnutrition   . Frequent falls   . Anxiety and depression   . Irritable bowel   . Recurrent UTI     Surgical History: Past Surgical History  Procedure Laterality Date  . Total abdominal hysterectomy w/ bilateral salpingoophorectomy    .  Mastectomy Right     DX MASTITIS/NO CANCER.Marland KitchenSALINE IMPLANT  . Cholecystectomy    . Hemorrhoid surgery      WITH RECONSTRUCTION  . Bladder tacking      X 3  . Eye surgeries      WITH BUCKLE DETACHMENT OF THE RETINA  . Tonsillectomy    . Appendectomy    . Tympanoplasty    . Breast surgery    . Mastectomy    . Breast lumpectomy    . Colonoscopy with propofol N/A 01/21/2015    Procedure: COLONOSCOPY WITH PROPOFOL;  Surgeon: Josefine Class, MD;  Location: [facility] ENDOSCOPY;  Service: Endoscopy;  Laterality: N/A;  . Esophagogastroduodenoscopy N/A 01/21/2015    Procedure: ESOPHAGOGASTRODUODENOSCOPY (EGD);  Surgeon: Josefine Class, MD;  Location: [facility] ENDOSCOPY;  Service: Endoscopy;  Laterality: N/A;    Home Medications:    Medication List       This list is accurate as of: 05/06/15 11:59 PM.  Always use your most recent med list.               acetic acid-aluminum acetate 2 % otic solution  Commonly known as:  DOMEBORO OTIC  every 3 (three) hours.     CRESTOR 5 MG tablet  Generic drug:  rosuvastatin  Take 2.5 mg by mouth.  [employer]S VITAMIN B12 2000 MCG tablet  Generic drug:  cyanocobalamin  Take by mouth.     CYSTEX 162-162.5 MG Tabs  Generic drug:  Methenamine-Sodium Salicylate  Take by mouth.     dicyclomine 10 MG capsule  Commonly known as:  BENTYL  TAKE 1 CAPSULE (10 MG TOTAL) BY MOUTH EVERY 8 (EIGHT) HOURS AS NEEDED.     doxycycline 100 MG tablet  Commonly known as:  VIBRA-TABS  Take 1 tablet (100 mg total) by mouth 2 (two) times daily.     ferrous fumarate 325 (106 FE) MG Tabs tablet  Commonly known as:  HEMOCYTE - 106 mg FE  Take 1 tablet by mouth.     glucose blood test strip  1 each by Other route as needed for other. Use as instructed     meclizine 25 MG tablet  Commonly known as:  ANTIVERT  TAKE ONE TO TWO TABLETS BY MOUTH EVERY 8 HOURS AS NEEDED     metFORMIN 500 MG tablet  Commonly known as:  GLUCOPHAGE  Take by mouth.     omeprazole 20  MG capsule  Commonly known as:  PRILOSEC  Take 20 mg by mouth daily.     oxyCODONE-acetaminophen 7.5-325 MG tablet  Commonly known as:  PERCOCET  Take 1 tablet by mouth every 4 (four) hours as needed for severe pain.     phenazopyridine 95 MG tablet  Commonly known as:  PYRIDIUM  Take 95 mg by mouth 3 (three) times daily as needed for pain.     PROBIOTIC COLON SUPPORT Caps  Take by mouth.     sertraline 50 MG tablet  Commonly known as:  ZOLOFT  TAKE ONE TABLET BY MOUTH ONE TIME DAILY     URIBEL 118 MG Caps  One tablet four times daily as needed with 8 oz of water     VITAMIN D-1000 MAX ST 1000 UNITS tablet  Generic drug:  Cholecalciferol  Take by mouth.        Allergies:  Allergies  Allergen Reactions  . Biaxin [Clarithromycin] Other (See Comments) and Diarrhea  . Copaxone [Glatiramer Acetate]   . Decadron [Dexamethasone] Nausea And Vomiting and Other (See Comments)    Other reaction(s): Muscle Pain Reaction:  Abdominal pain  . Diltiazem Hcl Other (See Comments)    Reaction:  Unknown   . Diltiazem Hcl Other (See Comments)  . Flagyl [Metronidazole] Nausea Only and Diarrhea    Other reaction(s): Vomiting  . Gabapentin Other (See Comments)    "burning all over" Reaction:  Unknown   . Iohexol Swelling and Other (See Comments)     Desc: tongue swelling with "IVP dye" sccording to nurses notes with a lumbar myelo-12/09- asm, Onset Date: [id number]  Pts tongue swells.  Clementeen Hoof [Iodinated Diagnostic Agents] Swelling and Other (See Comments)    Passed out  Pts tongue swells.   . Levothyroxine Nausea Only  . Sulfonamide Derivatives Other (See Comments)    Reaction:  Unknown   . Synthroid [Levothyroxine Sodium]   . Copaxone  [Glatiramer] Rash  . Interferon Beta-1a Other (See Comments) and Rash    Reaction:  Unknown     Family History: Family History  Problem Relation Age of Onset  . Arthritis Sister   . Aneurysm Brother   . Heart disease Daughter   . ALS Mother    . Colon cancer    . Kidney disease Neg Hx     Social History:  reports that she has  never smoked. She has never used smokeless tobacco. She reports that she does not drink alcohol or use illicit drugs.  ROS: UROLOGY Frequent Urination?: No Hard to postpone urination?: No Burning/pain with urination?: Yes Get up at night to urinate?: Yes Leakage of urine?: Yes Urine stream starts and stops?: No Trouble starting stream?: No Do you have to strain to urinate?: No Blood in urine?: No Urinary tract infection?: No Sexually transmitted disease?: No Injury to kidneys or bladder?: No Painful intercourse?: No Weak stream?: No Currently pregnant?: No Vaginal bleeding?: No Last menstrual period?: N  Gastrointestinal Nausea?: No Vomiting?: No Indigestion/heartburn?: Yes Diarrhea?: Yes Constipation?: Yes  Constitutional Fever: No Night sweats?: Yes Weight loss?: No Fatigue?: Yes  Skin Skin rash/lesions?: No Itching?: No  Eyes Blurred vision?: Yes Double vision?: No  Ears/Nose/Throat Sore throat?: No Sinus problems?: Yes  Hematologic/Lymphatic Swollen glands?: No Easy bruising?: No  Cardiovascular Leg swelling?: Yes Chest pain?: No  Respiratory Cough?: No Shortness of breath?: No  Endocrine Excessive thirst?: Yes  Musculoskeletal Back pain?: Yes Joint pain?: Yes  Neurological Headaches?: Yes Dizziness?: Yes  Psychologic Depression?: Yes Anxiety?: Yes  Physical Exam: Blood pressure 173/92, pulse 73, resp. rate 16, height _0  (1.575 m), weight 154 lb 8 oz (70.081 kg), SpO2 97 %.  Laboratory Data: Lab Results  Component Value Date   WBC 4.7 04/18/2015   HGB 9.7* 01/06/2015   HCT 39.9 04/18/2015   MCV 75.6* 01/06/2015   PLT 207 01/06/2015   Lab Results  Component Value Date   CREATININE 1.03* 04/18/2015   Lab Results  Component Value Date   HGBA1C 6.5 04/15/2015   Urinalysis: Results for orders placed or performed in visit on 05/06/15   Microscopic Examination  Result Value Ref Range   WBC, UA 0-5 0 -  5 /hpf   RBC, UA None seen 0 -  2 /hpf   Epithelial Cells (non renal) None seen 0 - 10 /hpf   Renal Epithel, UA None seen None seen /hpf   Mucus, UA Present (A) Not Estab.   Bacteria, UA None seen None seen/Few  Urinalysis, Complete  Result Value Ref Range   Specific Gravity, UA >1.030 (H) 1.005 - 1.030   pH, UA 5.0 5.0 - 7.5   Color, UA Green (A) Yellow   Appearance Ur Clear Clear   Leukocytes, UA Negative Negative   Protein, UA 1+ (A) Negative/Trace   Glucose, UA Negative Negative   Ketones, UA Negative Negative   RBC, UA Trace (A) Negative   Bilirubin, UA Negative Negative   Urobilinogen, Ur 0.2 0.2 - 1.0 mg/dL   Nitrite, UA Negative Negative   Microscopic Examination See below:     Assessment & Plan:    1. Interstitial cystitis:   Patient had a rescue solutions instilled today. She tolerated the procedure well. She will return next week for another rescue solution installation.   - Urinalysis, Complete - heparin-lidocaine-sod bicarb bladder irrigation (Bladder rescue for bladder instillation procedures); Irrigate with 11 mLs as directed once.  2. Suprapubic pain:    Patient has not undergone a cystoscopic evaluation in the past. She has missed her cystoscopy due to illness or UTI's.  She should undergo a cystoscopy with urine cytology in the future.      Return in about 1 week (around 05/13/2015) for rescue solution.  Zara Council, [facility] [address] [place], [place] [address] [phone]

## 2015-05-09 LAB — URINALYSIS, COMPLETE
BILIRUBIN UA: NEGATIVE
GLUCOSE, UA: NEGATIVE
KETONES UA: NEGATIVE
Leukocytes, UA: NEGATIVE
Nitrite, UA: NEGATIVE
PH UA: 5 (ref 5.0–7.5)
UUROB: 0.2 mg/dL (ref 0.2–1.0)

## 2015-05-09 LAB — MICROSCOPIC EXAMINATION
Bacteria, UA: NONE SEEN
Epithelial Cells (non renal): NONE SEEN /hpf (ref 0–10)
RBC, UA: NONE SEEN /hpf (ref 0–?)
Renal Epithel, UA: NONE SEEN /hpf

## 2015-05-10 ENCOUNTER — Telehealth: Payer: Self-pay | Admitting: Urology

## 2015-05-10 NOTE — Telephone Encounter (Signed)
She did with Dr. Elnoria Howard last October.  She does not need one at this time.

## 2015-05-10 NOTE — Telephone Encounter (Signed)
Patient has a lot of irritative bladder symptoms.  She was scheduled for a cystoscopy a few times and missed the appointments due to illnesses or UTI's.  She should probably undergo a cystoscopic examination with cytology with one of the doctors.

## 2015-05-10 NOTE — Telephone Encounter (Signed)
Spoke with patient and she states she did have a cystoscopy, couldn't remember when but a doctor at our practice did it.

## 2015-05-12 ENCOUNTER — Encounter: Payer: Self-pay | Admitting: Radiation Oncology

## 2015-05-12 ENCOUNTER — Ambulatory Visit
Admission: RE | Admit: 2015-05-12 | Discharge: 2015-05-12 | Disposition: A | Discharge status: Home / Self Care | Payer: Commercial Managed Care - HMO | Source: Ambulatory Visit | Attending: Radiation Oncology | Admitting: Radiation Oncology

## 2015-05-12 VITALS — BP 177/89 | HR 72 | Temp 97.9°F | Resp 20 | Wt 156.1 lb

## 2015-05-12 DIAGNOSIS — C50911 Malignant neoplasm of unspecified site of right female breast: Secondary | ICD-10-CM

## 2015-05-12 NOTE — Progress Notes (Signed)
Radiation Oncology Follow up Note  Name: [name]   Date:   05/12/2015 MRN:  [id number] DOB: 12/24/1929    This 81 y.o. 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 She is seen today in routine follow-up and is doing well she states she lost her nipple although she is on my exam just nipple retraction. She is a mammogram back in March which was fine she has another one scheduled for next March. She currently is on aromatase inhibitor tolerating that well I believe tamoxifen. She specifically denies cough or bone pain still has some occasional tenderness of the left breast.  COMPLICATIONS OF TREATMENT: none  FOLLOW UP COMPLIANCE: keeps appointments   PHYSICAL EXAM:  BP 177/89 mmHg  Pulse 72  Temp(Src) 97.9 F (36.6 C)  Resp 20  Wt 156 lb 1.4 oz (70.8 kg) Lungs are clear to A&P cardiac examination essentially unremarkable with regular rate and rhythm. No dominant mass or nodularity is noted in either breast in 2 positions examined. Incision is well-healed. No axillary or supraclavicular adenopathy is appreciated. Cosmetic result is excellent. Well-developed well-nourished patient in NAD. HEENT reveals PERLA, EOMI, discs not visualized.  Oral cavity is clear. No oral mucosal lesions are identified. Neck is clear without evidence of cervical or supraclavicular adenopathy. Lungs are clear to A&P. Cardiac examination is essentially unremarkable with regular rate and rhythm without murmur rub or thrill. Abdomen is benign with no organomegaly or masses noted. Motor sensory and DTR levels are equal and symmetric in the upper and lower extremities. Cranial nerves II through XII are grossly intact.  Proprioception is intact. No peripheral adenopathy or edema is identified. No motor or sensory levels are noted. Crude visual fields are within normal range.  RADIOLOGY RESULTS: Prior mammograms are requested for my review  PLAN: Present time 1 year out she continues to do well with no evidence of disease. I'm please were overall progress. I've assured her she did not lose her nipple she just had some retraction which is commonly seen after radiation and surgery. I've asked to see her back in 1 year for follow-up. She continues aromatase inhibitor therapy as planned. Patient knows to call sooner with any concerns.  I would like to take this opportunity for allowing me to participate in the care of your patient.Armstead Peaks., MD

## 2015-05-13 ENCOUNTER — Encounter: Payer: Self-pay | Admitting: Obstetrics and Gynecology

## 2015-05-13 ENCOUNTER — Ambulatory Visit (INDEPENDENT_AMBULATORY_CARE_PROVIDER_SITE_OTHER): Payer: Commercial Managed Care - HMO | Admitting: Obstetrics and Gynecology

## 2015-05-13 VITALS — BP 140/89 | HR 72 | Ht 62.0 in | Wt 155.9 lb

## 2015-05-13 DIAGNOSIS — R3129 Other microscopic hematuria: Secondary | ICD-10-CM

## 2015-05-13 DIAGNOSIS — N3010 Interstitial cystitis (chronic) without hematuria: Secondary | ICD-10-CM | POA: Diagnosis not present

## 2015-05-13 LAB — URINALYSIS, COMPLETE
BILIRUBIN UA: NEGATIVE
Glucose, UA: NEGATIVE
Nitrite, UA: NEGATIVE
PH UA: 5.5 (ref 5.0–7.5)
PROTEIN UA: NEGATIVE
SPEC GRAV UA: 1.025 (ref 1.005–1.030)
Urobilinogen, Ur: 0.2 mg/dL (ref 0.2–1.0)

## 2015-05-13 LAB — MICROSCOPIC EXAMINATION

## 2015-05-13 MED ORDER — LIDOCAINE HCL 2 % IJ SOLN
11.0000 mL | Freq: Once | INTRAMUSCULAR | Status: AC
  Administered 2015-05-13: 11 mL

## 2015-05-13 NOTE — Progress Notes (Signed)
Bladder Rescue Solution Instillation  Due to IC patient is present today for a Rescue Solution Treatment.  Patient was cleaned and prepped in a sterile fashion with betadine and lidocaine 2% jelly was instilled into the urethra.  A 14 FR catheter was inserted, urine return was noted 84ml, urine was clear and yellow in color.  Instilled a solution consisting of 59ml of Sodium Bicarb, 2 ml Lidocaine and 1 ml of Heparin. The catheter was then removed. Patient tolerated well, no complications were noted.   Performed by: Toniann Fail, LPN

## 2015-05-13 NOTE — Progress Notes (Signed)
05/13/2015 8:35 AM   [name] 1929-10-17 [id number]  Referring provider: Bobetta Lime, MD [address] [place], [place] [address]  Chief Complaint  Patient presents with  . Other    IC cocktail    HPI: Patient is an 81 year old white female with a history of interstitial cystitis who presents today for rescue installation. She has no urinary complaints today. She denies hematuria, fever, chills, nausea or vomiting. Her UA today is unremarkable.   She reports suprapubic pain and dysuria. She denies any gross hematuria. Upon review of her past records, she has not undergone a cystoscopic examination.   PMH: Past Medical History  Diagnosis Date  . GERD (gastroesophageal reflux disease)   . Vertigo   . Essential hypertension, benign   . Paroxysmal supraventricular tachycardia ([place])   . Pure hypercholesterolemia   . MS (multiple sclerosis) ([place])   . Fibromyalgia   . Diverticulosis   . Diabetes mellitus without complication ([place])   . Osteoarthritis   . Dyslipidemia   . SOB (shortness of breath)     SECONDARY TO REFLUX  . Chest pain     SECONDARY TO GERD  . Peripheral neuropathy (H[facility])     MILD  . Cancer ([place])     breast  . Depression   . Chronic interstitial cystitis   . History of fractured rib   . Chronic right hip pain   . History of TIA (transient ischemic attack)   . Carpal tunnel syndrome   . Thyroid nodule   . Hearing loss of left ear   . Vitamin B 12 deficiency   . Protein malnutrition ([place])   . Frequent falls   . Anxiety and depression   . Irritable bowel   . Recurrent UTI     Surgical History: Past Surgical History  Procedure Laterality Date  . Total abdominal hysterectomy w/ bilateral salpingoophorectomy    . Mastectomy Right     DX MASTITIS/NO CANCER.Marland KitchenSALINE IMPLANT  . Cholecystectomy    . Hemorrhoid surgery      WITH RECONSTRUCTION  . Bladder tacking      X 3  . Eye surgeries      WITH BUCKLE DETACHMENT OF THE RETINA   . Tonsillectomy    . Appendectomy    . Tympanoplasty    . Breast surgery    . Mastectomy    . Breast lumpectomy    . Colonoscopy with propofol N/A 01/21/2015    Procedure: COLONOSCOPY WITH PROPOFOL;  Surgeon: Josefine Class, MD;  Location: [facility] ENDOSCOPY;  Service: Endoscopy;  Laterality: N/A;  . Esophagogastroduodenoscopy N/A 01/21/2015    Procedure: ESOPHAGOGASTRODUODENOSCOPY (EGD);  Surgeon: Josefine Class, MD;  Location: [facility] ENDOSCOPY;  Service: Endoscopy;  Laterality: N/A;    Home Medications:    Medication List       This list is accurate as of: 05/13/15 11:59 PM.  Always use your most recent med list.               acetic acid-aluminum acetate 2 % otic solution  Commonly known as:  DOMEBORO OTIC  every 3 (three) hours.     CRESTOR 5 MG tablet  Generic drug:  rosuvastatin  Take 2.5 mg by mouth.     CVS VITAMIN B12 2000 MCG tablet  Generic drug:  cyanocobalamin  Take by mouth.     CYSTEX 162-162.5 MG Tabs  Generic drug:  Methenamine-Sodium Salicylate  Take by mouth.     dicyclomine 10 MG capsule  Commonly known as:  BENTYL  TAKE 1 CAPSULE (10 MG TOTAL) BY MOUTH EVERY 8 (EIGHT) HOURS AS NEEDED.     doxycycline 100 MG tablet  Commonly known as:  VIBRA-TABS  Take 1 tablet (100 mg total) by mouth 2 (two) times daily.     ferrous fumarate 325 (106 FE) MG Tabs tablet  Commonly known as:  HEMOCYTE - 106 mg FE  Take 1 tablet by mouth.     ferrous sulfate 325 (65 FE) MG tablet  Take by mouth.     glucose blood test strip  1 each by Other route as needed for other. Use as instructed     meclizine 25 MG tablet  Commonly known as:  ANTIVERT  TAKE ONE TO TWO TABLETS BY MOUTH EVERY 8 HOURS AS NEEDED     metFORMIN 500 MG tablet  Commonly known as:  GLUCOPHAGE  Take by mouth.     omeprazole 20 MG capsule  Commonly known as:  PRILOSEC  Take 20 mg by mouth daily.     oxyCODONE-acetaminophen 7.5-325 MG tablet  Commonly known as:  PERCOCET  Take 1  tablet by mouth every 4 (four) hours as needed for severe pain.     phenazopyridine 95 MG tablet  Commonly known as:  PYRIDIUM  Take 95 mg by mouth 3 (three) times daily as needed for pain.     PROBIOTIC COLON SUPPORT Caps  Take by mouth.     sertraline 50 MG tablet  Commonly known as:  ZOLOFT  TAKE ONE TABLET BY MOUTH ONE TIME DAILY     URIBEL 118 MG Caps  One tablet four times daily as needed with 8 oz of water     VITAMIN D-1000 MAX ST 1000 UNITS tablet  Generic drug:  Cholecalciferol  Take by mouth.        Allergies:  Allergies  Allergen Reactions  . Biaxin [Clarithromycin] Other (See Comments) and Diarrhea  . Copaxone [Glatiramer Acetate]   . Decadron [Dexamethasone] Nausea And Vomiting and Other (See Comments)    Other reaction(s): Muscle Pain Reaction:  Abdominal pain  . Diltiazem Hcl Other (See Comments)    Reaction:  Unknown   . Diltiazem Hcl Other (See Comments)  . Flagyl [Metronidazole] Nausea Only and Diarrhea    Other reaction(s): Vomiting  . Gabapentin Other (See Comments)    "burning all over" Reaction:  Unknown   . Iohexol Swelling and Other (See Comments)     Desc: tongue swelling with "IVP dye" sccording to nurses notes with a lumbar myelo-12/09- asm, Onset Date: [id number]  Pts tongue swells.  Clementeen Hoof [Iodinated Diagnostic Agents] Swelling and Other (See Comments)    Passed out  Pts tongue swells.   . Levothyroxine Nausea Only  . Sulfonamide Derivatives Other (See Comments)    Reaction:  Unknown   . Synthroid [Levothyroxine Sodium]   . Copaxone  [Glatiramer] Rash  . Interferon Beta-1a Other (See Comments) and Rash    Reaction:  Unknown     Family History: Family History  Problem Relation Age of Onset  . Arthritis Sister   . Aneurysm Brother   . Heart disease Daughter   . ALS Mother   . Colon cancer    . Kidney disease Neg Hx     Social History:  reports that she has never smoked. She has never used smokeless tobacco. She reports that  she does not drink alcohol or use illicit drugs.  ROS: UROLOGY Frequent Urination?: Yes Hard  to postpone urination?: No Burning/pain with urination?: Yes Get up at night to urinate?: Yes Leakage of urine?: Yes Urine stream starts and stops?: No Trouble starting stream?: No Do you have to strain to urinate?: No Blood in urine?: No Urinary tract infection?: No Sexually transmitted disease?: No Injury to kidneys or bladder?: No Painful intercourse?: No Weak stream?: No Currently pregnant?: No Vaginal bleeding?: No Last menstrual period?: n  Gastrointestinal Nausea?: No Vomiting?: No Indigestion/heartburn?: Yes Diarrhea?: Yes Constipation?: Yes  Constitutional Fever: No Night sweats?: Yes Weight loss?: No Fatigue?: Yes  Skin Skin rash/lesions?: No Itching?: No  Eyes Blurred vision?: Yes Double vision?: No  Ears/Nose/Throat Sore throat?: No Sinus problems?: Yes  Hematologic/Lymphatic Swollen glands?: No Easy bruising?: No  Cardiovascular Leg swelling?: Yes Chest pain?: No  Respiratory Cough?: No Shortness of breath?: No  Endocrine Excessive thirst?: No  Musculoskeletal Back pain?: Yes Joint pain?: Yes  Neurological Headaches?: Yes Dizziness?: Yes  Psychologic Depression?: Yes Anxiety?: Yes  Physical Exam: BP 140/89 mmHg  Pulse 72  Ht 5' 2" (1.575 m)  Wt 155 lb 14.4 oz (70.716 kg)  BMI 28.51 kg/m2  Constitutional:  Alert and oriented, No acute distress. HEENT: [place] AT, moist mucus membranes.  Trachea midline, no masses. Cardiovascular: No clubbing, cyanosis, or edema. Respiratory: Normal respiratory effort, no increased work of breathing. Skin: No rashes, bruises or suspicious lesions. Lymph: No cervical or inguinal adenopathy. Neurologic: Grossly intact, no focal deficits, moving all 4 extremities. Psychiatric: Normal mood and affect.  Laboratory Data:   Urinalysis Results for orders placed or performed in visit on 05/13/15   Microscopic Examination  Result Value Ref Range   WBC, UA 0-5 0 -  5 /hpf   RBC, UA 3-10 (A) 0 -  2 /hpf   Epithelial Cells (non renal) 0-10 0 - 10 /hpf   Renal Epithel, UA 0-10 (A) None seen /hpf   Mucus, UA Present (A) Not Estab.   Bacteria, UA Few (A) None seen/Few  CULTURE, URINE COMPREHENSIVE  Result Value Ref Range   Urine Culture, Comprehensive Final report (A)    Result 1 Klebsiella pneumoniae (A)    Result 2 Comment    ANTIMICROBIAL SUSCEPTIBILITY Comment   Urinalysis, Complete  Result Value Ref Range   Specific Gravity, UA 1.025 1.005 - 1.030   pH, UA 5.5 5.0 - 7.5   Color, UA Green (A) Yellow   Appearance Ur Clear Clear   Leukocytes, UA Trace (A) Negative   Protein, UA Negative Negative/Trace   Glucose, UA Negative Negative   Ketones, UA Trace (A) Negative   RBC, UA 1+ (A) Negative   Bilirubin, UA Negative Negative   Urobilinogen, Ur 0.2 0.2 - 1.0 mg/dL   Nitrite, UA Negative Negative   Microscopic Examination See below:     Pertinent Imaging:   Assessment & Plan:  81 year old female with a history of interstitial cystitis receiving intermittent rescue solutions to manage discomfort. Microscopic hematuria present on today's micro-urinalysis.  1. Interstitial cystitis - Urinalysis, Complete - PR IRRIGATION OF BLADDER; Standing - PR IRRIGATION OF BLADDER - heparin-lidocaine-sod bicarb bladder irrigation (Bladder rescue for bladder instillation procedures); Irrigate with 11 mLs as directed once.  2.Microscopic hematuria- it is unclear whether or not patient has had previous hematuria workup. After review of patient's previous micro-urinalysis it seems that she has experienced microscopic hematuria on a few occasions. She was recommended previously to have a cystoscopy but this was never performed. If she continues to have microscopic hematuria we will  discuss pursuing hematuria workup at her next visit. I do not suspect a urinary tract infection today but a culture  was sent to confirm.   Return in about 1 week (around 05/20/2015).  These notes generated with voice recognition software. I apologize for typographical errors.  Herbert Moors, [facility] [address] [place], [place] [address] [phone]

## 2015-05-15 LAB — CULTURE, URINE COMPREHENSIVE

## 2015-05-20 ENCOUNTER — Encounter: Payer: Self-pay | Admitting: Obstetrics and Gynecology

## 2015-05-20 ENCOUNTER — Ambulatory Visit (INDEPENDENT_AMBULATORY_CARE_PROVIDER_SITE_OTHER): Payer: Commercial Managed Care - HMO | Admitting: Obstetrics and Gynecology

## 2015-05-20 VITALS — BP 162/82 | HR 70 | Ht 63.0 in | Wt 153.4 lb

## 2015-05-20 DIAGNOSIS — N3010 Interstitial cystitis (chronic) without hematuria: Secondary | ICD-10-CM | POA: Diagnosis not present

## 2015-05-20 LAB — MICROSCOPIC EXAMINATION
Epithelial Cells (non renal): >10 /hpf — AB (ref 0–10)
RENAL EPITHEL UA: NONE SEEN /HPF

## 2015-05-20 LAB — URINALYSIS, COMPLETE
Bilirubin, UA: NEGATIVE
Glucose, UA: NEGATIVE
KETONES UA: NEGATIVE
LEUKOCYTES UA: NEGATIVE
Nitrite, UA: NEGATIVE
PH UA: 5.5 (ref 5.0–7.5)
Specific Gravity, UA: 1.025 (ref 1.005–1.030)
UUROB: 0.2 mg/dL (ref 0.2–1.0)

## 2015-05-20 MED ORDER — SODIUM BICARBONATE 8.4 % IV SOLN
11.0000 mL | Freq: Once | INTRAVENOUS | Status: AC
  Administered 2015-05-20: 11 mL

## 2015-05-20 NOTE — Progress Notes (Signed)
05/20/2015 11:53 AM   [name] 04-09-1930 [id number]  Referring provider: Bobetta Lime, MD [address] [place], [place] [address]  Chief Complaint  Patient presents with  . Interstitial cystitis    Rescue solution    HPI: Patient is an 81 year old white female with a history of interstitial cystitis who presents today for rescue installation. She has no new urinary complaints today. She denies hematuria, fever, chills, nausea or vomiting. Her UA today is unremarkable.   She reports suprapubic pain and dysuria. She denies any gross hematuria. At her visit last week she was noted to have microscopic hematuria on her UA. Urine culture was negative and upon review of her past records it was not apparent if she has ever have cystoscopic examination. Patient also states that she doesn't believe she has ever had a CT scan for evaluation of hematuria.  PMH: Past Medical History  Diagnosis Date  . GERD (gastroesophageal reflux disease)   . Vertigo   . Essential hypertension, benign   . Paroxysmal supraventricular tachycardia ([place])   . Pure hypercholesterolemia   . MS (multiple sclerosis) ([place])   . Fibromyalgia   . Diverticulosis   . Diabetes mellitus without complication ([place])   . Osteoarthritis   . Dyslipidemia   . SOB (shortness of breath)     SECONDARY TO REFLUX  . Chest pain     SECONDARY TO GERD  . Peripheral neuropathy (HCC)     MILD  . Cancer ([place])     breast  . Depression   . Chronic interstitial cystitis   . History of fractured rib   . Chronic right hip pain   . History of TIA (transient ischemic attack)   . Carpal tunnel syndrome   . Thyroid nodule   . Hearing loss of left ear   . Vitamin B 12 deficiency   . Protein malnutrition ([place])   . Frequent falls   . Anxiety and depression   . Irritable bowel   . Recurrent UTI     Surgical History: Past Surgical History  Procedure Laterality Date  . Total abdominal hysterectomy w/ bilateral  salpingoophorectomy    . Mastectomy Right     DX MASTITIS/NO CANCER.Marland KitchenSALINE IMPLANT  . Cholecystectomy    . Hemorrhoid surgery      WITH RECONSTRUCTION  . Bladder tacking      X 3  . Eye surgeries      WITH BUCKLE DETACHMENT OF THE RETINA  . Tonsillectomy    . Appendectomy    . Tympanoplasty    . Breast surgery    . Mastectomy    . Breast lumpectomy    . Colonoscopy with propofol N/A 01/21/2015    Procedure: COLONOSCOPY WITH PROPOFOL;  Surgeon: Josefine Class, MD;  Location: [facility] ENDOSCOPY;  Service: Endoscopy;  Laterality: N/A;  . Esophagogastroduodenoscopy N/A 01/21/2015    Procedure: ESOPHAGOGASTRODUODENOSCOPY (EGD);  Surgeon: Josefine Class, MD;  Location: [facility] ENDOSCOPY;  Service: Endoscopy;  Laterality: N/A;    Home Medications:    Medication List       This list is accurate as of: 05/20/15 11:53 AM.  Always use your most recent med list.               acetic acid-aluminum acetate 2 % otic solution  Commonly known as:  DOMEBORO OTIC  every 3 (three) hours.     CRESTOR 5 MG tablet  Generic drug:  rosuvastatin  Take 2.5 mg by mouth.  [employer] VITAMIN B12 2000 MCG tablet  Generic drug:  cyanocobalamin  Take by mouth.     CYSTEX 162-162.5 MG Tabs  Generic drug:  Methenamine-Sodium Salicylate  Take by mouth.     dicyclomine 10 MG capsule  Commonly known as:  BENTYL  TAKE 1 CAPSULE (10 MG TOTAL) BY MOUTH EVERY 8 (EIGHT) HOURS AS NEEDED.     doxycycline 100 MG tablet  Commonly known as:  VIBRA-TABS  Take 1 tablet (100 mg total) by mouth 2 (two) times daily.     ferrous fumarate 325 (106 FE) MG Tabs tablet  Commonly known as:  HEMOCYTE - 106 mg FE  Take 1 tablet by mouth.     ferrous sulfate 325 (65 FE) MG tablet  Take by mouth.     glucose blood test strip  1 each by Other route as needed for other. Use as instructed     meclizine 25 MG tablet  Commonly known as:  ANTIVERT  TAKE ONE TO TWO TABLETS BY MOUTH EVERY 8 HOURS AS NEEDED      metFORMIN 500 MG tablet  Commonly known as:  GLUCOPHAGE  Take by mouth.     omeprazole 20 MG capsule  Commonly known as:  PRILOSEC  Take 20 mg by mouth daily.     oxyCODONE-acetaminophen 7.5-325 MG tablet  Commonly known as:  PERCOCET  Take 1 tablet by mouth every 4 (four) hours as needed for severe pain.     phenazopyridine 95 MG tablet  Commonly known as:  PYRIDIUM  Take 95 mg by mouth 3 (three) times daily as needed for pain.     PROBIOTIC COLON SUPPORT Caps  Take by mouth.     sertraline 50 MG tablet  Commonly known as:  ZOLOFT  TAKE ONE TABLET BY MOUTH ONE TIME DAILY     URIBEL 118 MG Caps  One tablet four times daily as needed with 8 oz of water     VITAMIN D-1000 MAX ST 1000 UNITS tablet  Generic drug:  Cholecalciferol  Take by mouth.        Allergies:  Allergies  Allergen Reactions  . Biaxin [Clarithromycin] Other (See Comments) and Diarrhea  . Copaxone [Glatiramer Acetate]   . Decadron [Dexamethasone] Nausea And Vomiting and Other (See Comments)    Other reaction(s): Muscle Pain Reaction:  Abdominal pain  . Diltiazem Hcl Other (See Comments)    Reaction:  Unknown   . Diltiazem Hcl Other (See Comments)  . Flagyl [Metronidazole] Nausea Only and Diarrhea    Other reaction(s): Vomiting  . Gabapentin Other (See Comments)    "burning all over" Reaction:  Unknown   . Iohexol Swelling and Other (See Comments)     Desc: tongue swelling with "IVP dye" sccording to nurses notes with a lumbar myelo-12/09- asm, Onset Date: [id number]  Pts tongue swells.  Clementeen Hoof [Iodinated Diagnostic Agents] Swelling and Other (See Comments)    Passed out  Pts tongue swells.   . Levothyroxine Nausea Only  . Sulfonamide Derivatives Other (See Comments)    Reaction:  Unknown   . Synthroid [Levothyroxine Sodium]   . Copaxone  [Glatiramer] Rash  . Interferon Beta-1a Other (See Comments) and Rash    Reaction:  Unknown     Family History: Family History  Problem Relation Age of  Onset  . Arthritis Sister   . Aneurysm Brother   . Heart disease Daughter   . ALS Mother   . Colon cancer    .  Kidney disease Neg Hx     Social History:  reports that she has never smoked. She has never used smokeless tobacco. She reports that she does not drink alcohol or use illicit drugs.  ROS: UROLOGY Frequent Urination?: No Hard to postpone urination?: Yes Burning/pain with urination?: Yes Get up at night to urinate?: Yes Leakage of urine?: No Urine stream starts and stops?: Yes Trouble starting stream?: No Do you have to strain to urinate?: No Blood in urine?: No Urinary tract infection?: No Sexually transmitted disease?: No Injury to kidneys or bladder?: No Painful intercourse?: No Weak stream?: No Currently pregnant?: No Vaginal bleeding?: No Last menstrual period?: n  Gastrointestinal Nausea?: No Vomiting?: No Indigestion/heartburn?: No Diarrhea?: No Constipation?: No  Constitutional Fever: No Night sweats?: Yes Weight loss?: No Fatigue?: Yes  Skin Skin rash/lesions?: No Itching?: No  Eyes Blurred vision?: Yes Double vision?: No  Ears/Nose/Throat Sore throat?: No Sinus problems?: Yes  Hematologic/Lymphatic Swollen glands?: No Easy bruising?: Yes  Cardiovascular Leg swelling?: Yes Chest pain?: No  Respiratory Cough?: No Shortness of breath?: No  Endocrine Excessive thirst?: No  Musculoskeletal Back pain?: Yes Joint pain?: Yes  Neurological Headaches?: Yes Dizziness?: Yes  Psychologic Depression?: Yes Anxiety?: Yes  Physical Exam: BP 162/82 mmHg  Pulse 70  Ht 5' 3"  (1.6 m)  Wt 153 lb 6.4 oz (69.582 kg)  BMI 27.18 kg/m2  Constitutional:  Alert and oriented, No acute distress. HEENT: [place] AT, moist mucus membranes.  Trachea midline, no masses. Cardiovascular: No clubbing, cyanosis, or edema. Respiratory: Normal respiratory effort, no increased work of breathing. Skin: No rashes, bruises or suspicious lesions. Lymph: No  cervical or inguinal adenopathy. Neurologic: Grossly intact, no focal deficits, moving all 4 extremities. Psychiatric: Normal mood and affect.  Laboratory Data:   Urinalysis    Component Value Date/Time   COLORURINE Amber 11/12/2014 1650   COLORURINE GREEN 06/26/2010 1442   APPEARANCEUR Clear 11/12/2014 1650   APPEARANCEUR Sl Cloudy 06/26/2010 1442   LABSPEC 1.021 11/12/2014 1650   LABSPEC >=1.030 06/26/2010 1442   PHURINE 5.0 11/12/2014 1650   PHURINE 6.5 06/26/2010 1442   GLUCOSEU Negative 05/13/2015 1116   GLUCOSEU Negative 11/12/2014 1650   GLUCOSEU NEGATIVE 06/26/2010 1442   HGBUR 1+ 11/12/2014 1650   HGBUR NEGATIVE 04/13/2009 0840   BILIRUBINUR Negative 05/13/2015 1116   BILIRUBINUR Negative 11/12/2014 1650   BILIRUBINUR NEGATIVE 06/26/2010 1442   KETONESUR Negative 11/12/2014 1650   KETONESUR TRACE 06/26/2010 1442   PROTEINUR Negative 11/12/2014 1650   PROTEINUR NEGATIVE 04/13/2009 0840   UROBILINOGEN 0.2 06/26/2010 1442   NITRITE Negative 05/13/2015 1116   NITRITE Positive 11/12/2014 1650   NITRITE NEGATIVE 06/26/2010 1442   LEUKOCYTESUR Trace* 05/13/2015 1116   LEUKOCYTESUR Negative 11/12/2014 1650   LEUKOCYTESUR NEGATIVE 06/26/2010 1442    Pertinent Imaging:   Assessment & Plan:  81 year old female with a history of interstitial cystitis receiving intermittent rescue solutions to manage discomfort. History of recent microscopic hematuria.  1. Interstitial cystitis- Patient received a rescue solution today.  She has been tolerating solutions well. - Urinalysis, Complete  2. Microscopic hematuria-  UA only showing 0-2 RBCs today. We discussed the differential diagnosis for microscopic hematuria including nephrolithiasis, renal or upper tract tumors, bladder stones, UTIs, or bladder tumors as well as undetermined etiologies. Per AUA guidelines, patient will need complete microscopic hematuria evaluation including CTU, possible urine cytology, and office  cystoscopy if this has not previously been completed.   Patient has previously been seen by Zara Council, PA  and I will have her follow-up in one week to further discuss possible microscopic hematuria workup.  Return in about 1 week (around 05/27/2015) for with [facility] [address] microscopic hematuria and rescue solution.  These notes generated with voice recognition software. I apologize for typographical errors.  Herbert Moors, [facility] [address], [place], [place] [address] [phone]

## 2015-05-20 NOTE — Progress Notes (Signed)
Bladder Rescue Solution Instillation  Due to interstitial cystitis patient is present today for a Rescue Solution Treatment.  Patient was cleaned and prepped in a sterile fashion with betadine and lidocaine 2% jelly was instilled into the urethra.  A 14 FR catheter was inserted, urine return was noted 54ml, urine was blue in color.  Instilled a solution consisting of 43ml of Sodium Bicarb, 2 ml Lidocaine and 1 ml of Heparin. The catheter was then removed. Patient tolerated well, no complications were noted.   Performed by: Lyndee Hensen CMA Follow up/ Additional Notes: one week

## 2015-05-23 ENCOUNTER — Telehealth: Payer: Self-pay | Admitting: Family Medicine

## 2015-05-23 ENCOUNTER — Other Ambulatory Visit: Payer: Self-pay | Admitting: Family Medicine

## 2015-05-23 DIAGNOSIS — N3010 Interstitial cystitis (chronic) without hematuria: Secondary | ICD-10-CM

## 2015-05-23 NOTE — Telephone Encounter (Signed)
Referral renewal placed.

## 2015-05-23 NOTE — Telephone Encounter (Signed)
Pt needs referral to [facility]. She has an appt on Friday 05/27/15. Pt does not know the name of the dr.

## 2015-05-31 ENCOUNTER — Ambulatory Visit (INDEPENDENT_AMBULATORY_CARE_PROVIDER_SITE_OTHER): Payer: Commercial Managed Care - HMO | Admitting: Urology

## 2015-05-31 ENCOUNTER — Encounter: Payer: Self-pay | Admitting: Urology

## 2015-05-31 VITALS — BP 174/85 | HR 70 | Ht 62.0 in | Wt 156.3 lb

## 2015-05-31 DIAGNOSIS — N3010 Interstitial cystitis (chronic) without hematuria: Secondary | ICD-10-CM | POA: Diagnosis not present

## 2015-05-31 LAB — URINALYSIS, COMPLETE
Bilirubin, UA: NEGATIVE
GLUCOSE, UA: NEGATIVE
Ketones, UA: NEGATIVE
NITRITE UA: NEGATIVE
RBC, UA: NEGATIVE
Specific Gravity, UA: 1.025 (ref 1.005–1.030)
UUROB: 0.2 mg/dL (ref 0.2–1.0)
pH, UA: 6 (ref 5.0–7.5)

## 2015-05-31 LAB — MICROSCOPIC EXAMINATION
RBC MICROSCOPIC, UA: NONE SEEN /HPF (ref 0–?)
Renal Epithel, UA: NONE SEEN /hpf

## 2015-05-31 MED ORDER — SODIUM BICARBONATE 8.4 % IV SOLN
11.0000 mL | Freq: Once | INTRAVENOUS | Status: AC
  Administered 2015-05-31: 11 mL

## 2015-05-31 NOTE — Progress Notes (Signed)
Bladder Rescue Solution Instillation  Due to IC patient is present today for a Rescue Solution Treatment.  Patient was cleaned and prepped in a sterile fashion with betadine and lidocaine 2% jelly was instilled into the urethra.  A 14 FR catheter was inserted, urine return was noted 49ml, urine was yellow in color.  Instilled a solution consisting of 57ml of Sodium Bicarb, 2 ml Lidocaine and 1 ml of Heparin. The catheter was then removed. Patient tolerated well, no complications were noted.   Performed by: Fonnie Jarvis, CMA  Follow up/ Additional Notes: PRN

## 2015-05-31 NOTE — Progress Notes (Signed)
9:49 PM   [name] 1930-01-13 [id number]  Referring provider: Bobetta Lime, MD [address] [place], [place] [address]  Chief Complaint  Patient presents with  . Interstitial Cystitis    follow up    HPI:  Patient is an 81 year old white female with a history of interstitial cystitis who presents today for rescue installation.  She has no urinary complaints today. She denies hematuria, fever, chills, nausea or vomiting. Her UA today is unremarkable.     PMH: Past Medical History  Diagnosis Date  . GERD (gastroesophageal reflux disease)   . Vertigo   . Essential hypertension, benign   . Paroxysmal supraventricular tachycardia ([place])   . Pure hypercholesterolemia   . MS (multiple sclerosis) ([place])   . Fibromyalgia   . Diverticulosis   . Diabetes mellitus without complication ([place])   . Osteoarthritis   . Dyslipidemia   . SOB (shortness of breath)     SECONDARY TO REFLUX  . Chest pain     SECONDARY TO GERD  . Peripheral neuropathy (HCC)     MILD  . Cancer ([place])     breast  . Depression   . Chronic interstitial cystitis   . History of fractured rib   . Chronic right hip pain   . History of TIA (transient ischemic attack)   . Carpal tunnel syndrome   . Thyroid nodule   . Hearing loss of left ear   . Vitamin B 12 deficiency   . Protein malnutrition ([place])   . Frequent falls   . Anxiety and depression   . Irritable bowel   . Recurrent UTI     Surgical History: Past Surgical History  Procedure Laterality Date  . Total abdominal hysterectomy w/ bilateral salpingoophorectomy    . Mastectomy Right     DX MASTITIS/NO CANCER.Marland KitchenSALINE IMPLANT  . Cholecystectomy    . Hemorrhoid surgery      WITH RECONSTRUCTION  . Bladder tacking      X 3  . Eye surgeries      WITH BUCKLE DETACHMENT OF THE RETINA  . Tonsillectomy    . Appendectomy    . Tympanoplasty    . Breast surgery    . Mastectomy    . Breast lumpectomy    . Colonoscopy with propofol  N/A 01/21/2015    Procedure: COLONOSCOPY WITH PROPOFOL;  Surgeon: Josefine Class, MD;  Location: [facility] ENDOSCOPY;  Service: Endoscopy;  Laterality: N/A;  . Esophagogastroduodenoscopy N/A 01/21/2015    Procedure: ESOPHAGOGASTRODUODENOSCOPY (EGD);  Surgeon: Josefine Class, MD;  Location: [facility] ENDOSCOPY;  Service: Endoscopy;  Laterality: N/A;    Home Medications:    Medication List       This list is accurate as of: 05/31/15 11:59 PM.  Always use your most recent med list.               acetic acid-aluminum acetate 2 % otic solution  Commonly known as:  DOMEBORO OTIC  every 3 (three) hours.     CRESTOR 5 MG tablet  Generic drug:  rosuvastatin  Take 2.5 mg by mouth.     CVS VITAMIN B12 2000 MCG tablet  Generic drug:  cyanocobalamin  Take by mouth.     CYSTEX 162-162.5 MG Tabs  Generic drug:  Methenamine-Sodium Salicylate  Take by mouth.     dicyclomine 10 MG capsule  Commonly known as:  BENTYL  TAKE 1 CAPSULE (10 MG TOTAL) BY MOUTH EVERY 8 (EIGHT) HOURS AS  NEEDED.     doxycycline 100 MG tablet  Commonly known as:  VIBRA-TABS  Take 1 tablet (100 mg total) by mouth 2 (two) times daily.     ferrous fumarate 325 (106 FE) MG Tabs tablet  Commonly known as:  HEMOCYTE - 106 mg FE  Take 1 tablet by mouth.     ferrous sulfate 325 (65 FE) MG tablet  Take by mouth.     glucose blood test strip  1 each by Other route as needed for other. Use as instructed     meclizine 25 MG tablet  Commonly known as:  ANTIVERT  TAKE ONE TO TWO TABLETS BY MOUTH EVERY 8 HOURS AS NEEDED     metFORMIN 500 MG tablet  Commonly known as:  GLUCOPHAGE  Take by mouth.     omeprazole 20 MG capsule  Commonly known as:  PRILOSEC  Take 20 mg by mouth daily.     oxyCODONE-acetaminophen 7.5-325 MG tablet  Commonly known as:  PERCOCET  Take 1 tablet by mouth every 4 (four) hours as needed for severe pain.     phenazopyridine 95 MG tablet  Commonly known as:  PYRIDIUM  Take 95 mg by mouth 3  (three) times daily as needed for pain.     PROBIOTIC COLON SUPPORT Caps  Take by mouth.     sertraline 50 MG tablet  Commonly known as:  ZOLOFT  TAKE ONE TABLET BY MOUTH ONE TIME DAILY     URIBEL 118 MG Caps  One tablet four times daily as needed with 8 oz of water     VITAMIN D-1000 MAX ST 1000 UNITS tablet  Generic drug:  Cholecalciferol  Take by mouth.        Allergies:  Allergies  Allergen Reactions  . Biaxin [Clarithromycin] Other (See Comments) and Diarrhea  . Copaxone [Glatiramer Acetate]   . Decadron [Dexamethasone] Nausea And Vomiting and Other (See Comments)    Other reaction(s): Muscle Pain Reaction:  Abdominal pain  . Diltiazem Hcl Other (See Comments)    Reaction:  Unknown   . Diltiazem Hcl Other (See Comments)  . Flagyl [Metronidazole] Nausea Only and Diarrhea    Other reaction(s): Vomiting  . Gabapentin Other (See Comments)    "burning all over" Reaction:  Unknown   . Iohexol Swelling and Other (See Comments)     Desc: tongue swelling with "IVP dye" sccording to nurses notes with a lumbar myelo-12/09- asm, Onset Date: [id number]  Pts tongue swells.  Clementeen Hoof [Iodinated Diagnostic Agents] Swelling and Other (See Comments)    Passed out  Pts tongue swells.   . Levothyroxine Nausea Only  . Sulfonamide Derivatives Other (See Comments)    Reaction:  Unknown   . Synthroid [Levothyroxine Sodium]   . Copaxone  [Glatiramer] Rash  . Interferon Beta-1a Other (See Comments) and Rash    Reaction:  Unknown     Family History: Family History  Problem Relation Age of Onset  . Arthritis Sister   . Aneurysm Brother   . Heart disease Daughter   . ALS Mother   . Colon cancer    . Kidney disease Neg Hx     Social History:  reports that she has never smoked. She has never used smokeless tobacco. She reports that she does not drink alcohol or use illicit drugs.  ROS: UROLOGY Frequent Urination?: Yes Hard to postpone urination?: No Burning/pain with  urination?: No Get up at night to urinate?: Yes Leakage of urine?: No  Urine stream starts and stops?: Yes Trouble starting stream?: No Do you have to strain to urinate?: No Blood in urine?: No Urinary tract infection?: No Sexually transmitted disease?: No Injury to kidneys or bladder?: No Painful intercourse?: No Weak stream?: No Currently pregnant?: No Vaginal bleeding?: No Last menstrual period?: n  Gastrointestinal Nausea?: No Vomiting?: No Indigestion/heartburn?: Yes Diarrhea?: Yes Constipation?: Yes  Constitutional Fever: No Night sweats?: Yes Weight loss?: No Fatigue?: Yes  Skin Skin rash/lesions?: No Itching?: No  Eyes Blurred vision?: Yes Double vision?: No  Ears/Nose/Throat Sore throat?: Yes Sinus problems?: Yes  Hematologic/Lymphatic Swollen glands?: No Easy bruising?: Yes  Cardiovascular Leg swelling?: No Chest pain?: No  Respiratory Cough?: No Shortness of breath?: No  Endocrine Excessive thirst?: No  Musculoskeletal Back pain?: Yes Joint pain?: Yes  Neurological Headaches?: Yes Dizziness?: Yes  Psychologic Depression?: Yes Anxiety?: Yes  Physical Exam: Blood pressure 174/85, pulse 70, height 5' 2"  (1.575 m), weight 156 lb 4.8 oz (70.897 kg).  Laboratory Data: Lab Results  Component Value Date   WBC 4.7 04/18/2015   HGB 9.7* 01/06/2015   HCT 39.9 04/18/2015   MCV 75.6* 01/06/2015   PLT 207 01/06/2015   Lab Results  Component Value Date   CREATININE 1.03* 04/18/2015   Lab Results  Component Value Date   HGBA1C 6.5 04/15/2015   Urinalysis: Results for orders placed or performed in visit on 05/31/15  Microscopic Examination  Result Value Ref Range   WBC, UA 0-5 0 -  5 /hpf   RBC, UA None seen 0 -  2 /hpf   Epithelial Cells (non renal) >10 (A) 0 - 10 /hpf   Renal Epithel, UA None seen None seen /hpf   Mucus, UA Present (A) Not Estab.   Bacteria, UA Few (A) None seen/Few  Urinalysis, Complete  Result Value Ref  Range   Specific Gravity, UA 1.025 1.005 - 1.030   pH, UA 6.0 5.0 - 7.5   Color, UA Green (A) Yellow   Appearance Ur Clear Clear   Leukocytes, UA Trace (A) Negative   Protein, UA 1+ (A) Negative/Trace   Glucose, UA Negative Negative   Ketones, UA Negative Negative   RBC, UA Negative Negative   Bilirubin, UA Negative Negative   Urobilinogen, Ur 0.2 0.2 - 1.0 mg/dL   Nitrite, UA Negative Negative   Microscopic Examination See below:     Assessment & Plan:    1. Interstitial cystitis:   Patient had a rescue solutions instilled today. She tolerated the procedure well. She will return next week for another rescue solution installation.   - Urinalysis, Complete - heparin-lidocaine-sod bicarb bladder irrigation (Bladder rescue for bladder instillation procedures); Irrigate with 11 mLs as directed once.  2. Suprapubic pain:    Patient did undergo a cystoscopic examination on October 2015.  No GU pathology was identified.    No Follow-up on file.  Zara Council, [facility] [address] [place], [place] [address] [phone]

## 2015-06-07 ENCOUNTER — Ambulatory Visit (INDEPENDENT_AMBULATORY_CARE_PROVIDER_SITE_OTHER): Payer: Commercial Managed Care - HMO | Admitting: Urology

## 2015-06-07 ENCOUNTER — Encounter: Payer: Self-pay | Admitting: Urology

## 2015-06-07 VITALS — BP 159/79 | HR 70 | Temp 98.4°F | Resp 16 | Wt 153.0 lb

## 2015-06-07 DIAGNOSIS — R1030 Lower abdominal pain, unspecified: Secondary | ICD-10-CM

## 2015-06-07 DIAGNOSIS — N3010 Interstitial cystitis (chronic) without hematuria: Secondary | ICD-10-CM

## 2015-06-07 LAB — MICROSCOPIC EXAMINATION
Renal Epithel, UA: NONE SEEN /hpf
WBC, UA: NONE SEEN /hpf (ref 0–?)

## 2015-06-07 LAB — URINALYSIS, COMPLETE
BILIRUBIN UA: NEGATIVE
GLUCOSE, UA: NEGATIVE
KETONES UA: NEGATIVE
LEUKOCYTES UA: NEGATIVE
NITRITE UA: NEGATIVE
PH UA: 6 (ref 5.0–7.5)
Specific Gravity, UA: >1.03 — ABNORMAL HIGH (ref 1.005–1.030)
UUROB: 0.2 mg/dL (ref 0.2–1.0)

## 2015-06-07 MED ORDER — SODIUM BICARBONATE 8.4 % IV SOLN
11.0000 mL | Freq: Once | INTRAVENOUS | Status: AC
  Administered 2015-06-07: 11 mL

## 2015-06-07 NOTE — Progress Notes (Signed)
Bladder Rescue Solution Instillation  Due to IC patient is present today for a Rescue Solution Treatment.  Patient was cleaned and prepped in a sterile fashion with betadine and lidocaine 2% jelly was instilled into the urethra.  A 14 FR catheter was inserted, urine return was noted 78ml, urine was blue/green in color.  Instilled a solution consisting of 52ml of Sodium Bicarb, 2 ml Lidocaine and 1 ml of Heparin. The catheter was then removed. Patient tolerated well, no complications were noted.   Performed by: Toniann Fail, LPN

## 2015-06-08 NOTE — Progress Notes (Signed)
11:28 AM   [name] 04-02-1930 [id number]  Referring provider: Bobetta Lime, MD [address] [place], [place] [address]  No chief complaint on file.   HPI:  Patient is an 81 year old white female with a history of interstitial cystitis who presents today for rescue installation.  She has no urinary complaints today. She denies hematuria, fever, chills, nausea or vomiting. Her UA today is unremarkable.    She states she had no difficulty with the last rescue solution.  She is finding benefit and pain relief from the rescue solutions and will like to continue these at this time.   PMH: Past Medical History  Diagnosis Date  . GERD (gastroesophageal reflux disease)   . Vertigo   . Essential hypertension, benign   . Paroxysmal supraventricular tachycardia ([place])   . Pure hypercholesterolemia   . MS (multiple sclerosis) ([place])   . Fibromyalgia   . Diverticulosis   . Diabetes mellitus without complication ([place])   . Osteoarthritis   . Dyslipidemia   . SOB (shortness of breath)     SECONDARY TO REFLUX  . Chest pain     SECONDARY TO GERD  . Peripheral neuropathy (HCC)     MILD  . Cancer ([place])     breast  . Depression   . Chronic interstitial cystitis   . History of fractured rib   . Chronic right hip pain   . History of TIA (transient ischemic attack)   . Carpal tunnel syndrome   . Thyroid nodule   . Hearing loss of left ear   . Vitamin B 12 deficiency   . Protein malnutrition ([place])   . Frequent falls   . Anxiety and depression   . Irritable bowel   . Recurrent UTI     Surgical History: Past Surgical History  Procedure Laterality Date  . Total abdominal hysterectomy w/ bilateral salpingoophorectomy    . Mastectomy Right     DX MASTITIS/NO CANCER.Marland KitchenSALINE IMPLANT  . Cholecystectomy    . Hemorrhoid surgery      WITH RECONSTRUCTION  . Bladder tacking      X 3  . Eye surgeries      WITH BUCKLE DETACHMENT OF THE RETINA  . Tonsillectomy    .  Appendectomy    . Tympanoplasty    . Breast surgery    . Mastectomy    . Breast lumpectomy    . Colonoscopy with propofol N/A 01/21/2015    Procedure: COLONOSCOPY WITH PROPOFOL;  Surgeon: Josefine Class, MD;  Location: [facility] ENDOSCOPY;  Service: Endoscopy;  Laterality: N/A;  . Esophagogastroduodenoscopy N/A 01/21/2015    Procedure: ESOPHAGOGASTRODUODENOSCOPY (EGD);  Surgeon: Josefine Class, MD;  Location: [facility] ENDOSCOPY;  Service: Endoscopy;  Laterality: N/A;    Home Medications:    Medication List       This list is accurate as of: 06/07/15 11:59 PM.  Always use your most recent med list.               acetic acid-aluminum acetate 2 % otic solution  Commonly known as:  DOMEBORO OTIC  every 3 (three) hours.     CRESTOR 5 MG tablet  Generic drug:  rosuvastatin  Take 2.5 mg by mouth.     [employer]S VITAMIN B12 2000 MCG tablet  Generic drug:  cyanocobalamin  Take by mouth.     CYSTEX 162-162.5 MG Tabs  Generic drug:  Methenamine-Sodium Salicylate  Take by mouth.     dicyclomine 10  MG capsule  Commonly known as:  BENTYL  TAKE 1 CAPSULE (10 MG TOTAL) BY MOUTH EVERY 8 (EIGHT) HOURS AS NEEDED.     doxycycline 100 MG tablet  Commonly known as:  VIBRA-TABS  Take 1 tablet (100 mg total) by mouth 2 (two) times daily.     ferrous fumarate 325 (106 FE) MG Tabs tablet  Commonly known as:  HEMOCYTE - 106 mg FE  Take 1 tablet by mouth.     ferrous sulfate 325 (65 FE) MG tablet  Take by mouth.     glucose blood test strip  1 each by Other route as needed for other. Use as instructed     meclizine 25 MG tablet  Commonly known as:  ANTIVERT  TAKE ONE TO TWO TABLETS BY MOUTH EVERY 8 HOURS AS NEEDED     metFORMIN 500 MG tablet  Commonly known as:  GLUCOPHAGE  Take by mouth.     omeprazole 20 MG capsule  Commonly known as:  PRILOSEC  Take 20 mg by mouth daily.     oxyCODONE-acetaminophen 7.5-325 MG tablet  Commonly known as:  PERCOCET  Take 1 tablet by mouth every 4  (four) hours as needed for severe pain.     phenazopyridine 95 MG tablet  Commonly known as:  PYRIDIUM  Take 95 mg by mouth 3 (three) times daily as needed for pain.     PROBIOTIC COLON SUPPORT Caps  Take by mouth.     sertraline 50 MG tablet  Commonly known as:  ZOLOFT  TAKE ONE TABLET BY MOUTH ONE TIME DAILY     URIBEL 118 MG Caps  One tablet four times daily as needed with 8 oz of water     VITAMIN D-1000 MAX ST 1000 UNITS tablet  Generic drug:  Cholecalciferol  Take by mouth.        Allergies:  Allergies  Allergen Reactions  . Biaxin [Clarithromycin] Other (See Comments) and Diarrhea  . Copaxone [Glatiramer Acetate]   . Decadron [Dexamethasone] Nausea And Vomiting and Other (See Comments)    Other reaction(s): Muscle Pain Reaction:  Abdominal pain  . Diltiazem Hcl Other (See Comments)    Reaction:  Unknown   . Diltiazem Hcl Other (See Comments)  . Flagyl [Metronidazole] Nausea Only and Diarrhea    Other reaction(s): Vomiting  . Gabapentin Other (See Comments)    "burning all over" Reaction:  Unknown   . Iohexol Swelling and Other (See Comments)     Desc: tongue swelling with "IVP dye" sccording to nurses notes with a lumbar myelo-12/09- asm, Onset Date: [id number]  Pts tongue swells.  Clementeen Hoof [Iodinated Diagnostic Agents] Swelling and Other (See Comments)    Passed out  Pts tongue swells.   . Levothyroxine Nausea Only  . Sulfonamide Derivatives Other (See Comments)    Reaction:  Unknown   . Synthroid [Levothyroxine Sodium]   . Copaxone  [Glatiramer] Rash  . Interferon Beta-1a Other (See Comments) and Rash    Reaction:  Unknown     Family History: Family History  Problem Relation Age of Onset  . Arthritis Sister   . Aneurysm Brother   . Heart disease Daughter   . ALS Mother   . Colon cancer    . Kidney disease Neg Hx     Social History:  reports that she has never smoked. She has never used smokeless tobacco. She reports that she does not drink  alcohol or use illicit drugs.  ROS: UROLOGY Frequent  Urination?: No Hard to postpone urination?: No Burning/pain with urination?: No Get up at night to urinate?: Yes Leakage of urine?: Yes Urine stream starts and stops?: No Trouble starting stream?: No Do you have to strain to urinate?: No Blood in urine?: No Urinary tract infection?: No Sexually transmitted disease?: No Injury to kidneys or bladder?: No Painful intercourse?: No Weak stream?: No Currently pregnant?: No Vaginal bleeding?: No Last menstrual period?: n  Gastrointestinal Nausea?: No Vomiting?: No Indigestion/heartburn?: Yes Diarrhea?: Yes Constipation?: Yes  Constitutional Fever: No Night sweats?: Yes Weight loss?: No Fatigue?: Yes  Skin Skin rash/lesions?: No Itching?: No  Eyes Blurred vision?: Yes Double vision?: No  Ears/Nose/Throat Sore throat?: Yes Sinus problems?: Yes  Hematologic/Lymphatic Swollen glands?: No Easy bruising?: No  Cardiovascular Leg swelling?: Yes Chest pain?: No  Respiratory Cough?: No Shortness of breath?: No  Endocrine Excessive thirst?: No  Musculoskeletal Back pain?: Yes Joint pain?: Yes  Neurological Headaches?: Yes Dizziness?: Yes  Psychologic Depression?: Yes Anxiety?: Yes  Physical Exam: Blood pressure 174/85, pulse 70, height 5' 2"  (1.575 m), weight 156 lb 4.8 oz (70.897 kg).  Laboratory Data: Lab Results  Component Value Date   WBC 4.7 04/18/2015   HGB 9.7* 01/06/2015   HCT 39.9 04/18/2015   MCV 75.6* 01/06/2015   PLT 207 01/06/2015   Lab Results  Component Value Date   CREATININE 1.03* 04/18/2015   Lab Results  Component Value Date   HGBA1C 6.5 04/15/2015   Urinalysis: Results for orders placed or performed in visit on 06/07/15  Microscopic Examination  Result Value Ref Range   WBC, UA None seen 0 -  5 /hpf   RBC, UA 0-2 0 -  2 /hpf   Epithelial Cells (non renal) >10 (A) 0 - 10 /hpf   Renal Epithel, UA None seen None  seen /hpf   Mucus, UA Present (A) Not Estab.   Bacteria, UA Few (A) None seen/Few  Urinalysis, Complete  Result Value Ref Range   Specific Gravity, UA >1.030 (H) 1.005 - 1.030   pH, UA 6.0 5.0 - 7.5   Color, UA Green (A) Yellow   Appearance Ur Clear Clear   Leukocytes, UA Negative Negative   Protein, UA 1+ (A) Negative/Trace   Glucose, UA Negative Negative   Ketones, UA Negative Negative   RBC, UA Trace (A) Negative   Bilirubin, UA Negative Negative   Urobilinogen, Ur 0.2 0.2 - 1.0 mg/dL   Nitrite, UA Negative Negative   Microscopic Examination See below:     Assessment & Plan:    1. Interstitial cystitis:   Patient had a rescue solutions instilled today. She tolerated the procedure well. She will return next week for another rescue solution installation.   - Urinalysis, Complete - heparin-lidocaine-sod bicarb bladder irrigation (Bladder rescue for bladder instillation procedures); Irrigate with 11 mLs as directed once.  2. Suprapubic pain:    Patient did undergo a cystoscopic examination on October 2015.  No GU pathology was identified.    Return in about 1 week (around 06/14/2015) for rescue solution.  Zara Council, [facility] [address], [place], [place] [address] [phone]

## 2015-06-14 ENCOUNTER — Encounter: Payer: Self-pay | Admitting: Urology

## 2015-06-14 ENCOUNTER — Ambulatory Visit (INDEPENDENT_AMBULATORY_CARE_PROVIDER_SITE_OTHER): Payer: Commercial Managed Care - HMO | Admitting: Urology

## 2015-06-14 VITALS — BP 163/99 | HR 76 | Ht 62.0 in | Wt 156.4 lb

## 2015-06-14 DIAGNOSIS — N3010 Interstitial cystitis (chronic) without hematuria: Secondary | ICD-10-CM | POA: Diagnosis not present

## 2015-06-14 DIAGNOSIS — G8929 Other chronic pain: Secondary | ICD-10-CM | POA: Diagnosis not present

## 2015-06-14 DIAGNOSIS — R1011 Right upper quadrant pain: Secondary | ICD-10-CM

## 2015-06-14 DIAGNOSIS — R109 Unspecified abdominal pain: Secondary | ICD-10-CM

## 2015-06-14 LAB — URINALYSIS, COMPLETE
BILIRUBIN UA: NEGATIVE
GLUCOSE, UA: NEGATIVE
KETONES UA: NEGATIVE
LEUKOCYTES UA: NEGATIVE
Nitrite, UA: NEGATIVE
PROTEIN UA: NEGATIVE
SPEC GRAV UA: 1.025 (ref 1.005–1.030)
Urobilinogen, Ur: 0.2 mg/dL (ref 0.2–1.0)
pH, UA: 5 (ref 5.0–7.5)

## 2015-06-14 LAB — MICROSCOPIC EXAMINATION: BACTERIA UA: NONE SEEN

## 2015-06-14 MED ORDER — SODIUM BICARBONATE 8.4 % IV SOLN
11.0000 mL | Freq: Once | INTRAVENOUS | Status: AC
  Administered 2015-06-14: 11 mL

## 2015-06-14 NOTE — Progress Notes (Signed)
11:08 AM   [name] June 18, 1930 [id number]  Referring provider: Bobetta Lime, MD [address] [place], [place] [address]  Chief Complaint  Patient presents with  . Interstitial Cystitis    Rescue solution    HPI:  Patient is an 81 year old white female with a history of interstitial cystitis who presents today for rescue installation.  She has no urinary complaints today. She denies hematuria, fever, chills, nausea or vomiting. Her UA today is remarkable for hyaline casts and calcium oxalate crystals.    She states she had no difficulty with the last rescue solution.  She is finding benefit and pain relief from the rescue solutions and will like to continue these at this time.  She is having right sided hip and back pain.  The pain has been intermittent for the last several months.  She does have a history of DJD of the lumbar spine, but she would like to make sure her kidneys are okay.  Her most recent serum creatinine was 1.03 on 04/18/2015.  She states she is drinking a large "Wendy's glass" size of water with Crystal Light daily.  PMH: Past Medical History  Diagnosis Date  . GERD (gastroesophageal reflux disease)   . Vertigo   . Essential hypertension, benign   . Paroxysmal supraventricular tachycardia ([place])   . Pure hypercholesterolemia   . MS (multiple sclerosis) ([place])   . Fibromyalgia   . Diverticulosis   . Diabetes mellitus without complication ([place])   . Osteoarthritis   . Dyslipidemia   . SOB (shortness of breath)     SECONDARY TO REFLUX  . Chest pain     SECONDARY TO GERD  . Peripheral neuropathy (HCC)     MILD  . Cancer ([place])     breast  . Depression   . Chronic interstitial cystitis   . History of fractured rib   . Chronic right hip pain   . History of TIA (transient ischemic attack)   . Carpal tunnel syndrome   . Thyroid nodule   . Hearing loss of left ear   . Vitamin B 12 deficiency   . Protein malnutrition ([place])   . Frequent  falls   . Anxiety and depression   . Irritable bowel   . Recurrent UTI     Surgical History: Past Surgical History  Procedure Laterality Date  . Total abdominal hysterectomy w/ bilateral salpingoophorectomy    . Mastectomy Right     DX MASTITIS/NO CANCER.Marland KitchenSALINE IMPLANT  . Cholecystectomy    . Hemorrhoid surgery      WITH RECONSTRUCTION  . Bladder tacking      X 3  . Eye surgeries      WITH BUCKLE DETACHMENT OF THE RETINA  . Tonsillectomy    . Appendectomy    . Tympanoplasty    . Breast surgery    . Mastectomy    . Breast lumpectomy    . Colonoscopy with propofol N/A 01/21/2015    Procedure: COLONOSCOPY WITH PROPOFOL;  Surgeon: Josefine Class, MD;  Location: [facility] ENDOSCOPY;  Service: Endoscopy;  Laterality: N/A;  . Esophagogastroduodenoscopy N/A 01/21/2015    Procedure: ESOPHAGOGASTRODUODENOSCOPY (EGD);  Surgeon: Josefine Class, MD;  Location: [facility] ENDOSCOPY;  Service: Endoscopy;  Laterality: N/A;    Home Medications:    Medication List       This list is accurate as of: 06/14/15 11:08 AM.  Always use your most recent med list.  acetic acid-aluminum acetate 2 % otic solution  Commonly known as:  DOMEBORO OTIC  every 3 (three) hours.     CRESTOR 5 MG tablet  Generic drug:  rosuvastatin  Take 2.5 mg by mouth.     CVS VITAMIN B12 2000 MCG tablet  Generic drug:  cyanocobalamin  Take by mouth.     dicyclomine 10 MG capsule  Commonly known as:  BENTYL  TAKE 1 CAPSULE (10 MG TOTAL) BY MOUTH EVERY 8 (EIGHT) HOURS AS NEEDED.     doxycycline 100 MG tablet  Commonly known as:  VIBRA-TABS  Take 1 tablet (100 mg total) by mouth 2 (two) times daily.     ferrous fumarate 325 (106 FE) MG Tabs tablet  Commonly known as:  HEMOCYTE - 106 mg FE  Take 1 tablet by mouth.     ferrous sulfate 325 (65 FE) MG tablet  Take by mouth.     glucose blood test strip  1 each by Other route as needed for other. Use as instructed     meclizine 25 MG tablet    Commonly known as:  ANTIVERT  TAKE ONE TO TWO TABLETS BY MOUTH EVERY 8 HOURS AS NEEDED     metFORMIN 500 MG tablet  Commonly known as:  GLUCOPHAGE  Take by mouth.     omeprazole 20 MG capsule  Commonly known as:  PRILOSEC  Take 20 mg by mouth daily.     oxyCODONE-acetaminophen 7.5-325 MG tablet  Commonly known as:  PERCOCET  Take 1 tablet by mouth every 4 (four) hours as needed for severe pain.     PROBIOTIC COLON SUPPORT Caps  Take by mouth.     sertraline 50 MG tablet  Commonly known as:  ZOLOFT  TAKE ONE TABLET BY MOUTH ONE TIME DAILY     URIBEL 118 MG Caps  One tablet four times daily as needed with 8 oz of water     VITAMIN D-1000 MAX ST 1000 UNITS tablet  Generic drug:  Cholecalciferol  Take by mouth.        Allergies:  Allergies  Allergen Reactions  . Biaxin [Clarithromycin] Other (See Comments) and Diarrhea  . Copaxone [Glatiramer Acetate]   . Decadron [Dexamethasone] Nausea And Vomiting and Other (See Comments)    Other reaction(s): Muscle Pain Reaction:  Abdominal pain  . Diltiazem Hcl Other (See Comments)    Reaction:  Unknown   . Diltiazem Hcl Other (See Comments)  . Flagyl [Metronidazole] Nausea Only and Diarrhea    Other reaction(s): Vomiting  . Gabapentin Other (See Comments)    "burning all over" Reaction:  Unknown   . Iohexol Swelling and Other (See Comments)     Desc: tongue swelling with "IVP dye" sccording to nurses notes with a lumbar myelo-12/09- asm, Onset Date: [id number]  Pts tongue swells.  Clementeen Hoof [Iodinated Diagnostic Agents] Swelling and Other (See Comments)    Passed out  Pts tongue swells.   . Levothyroxine Nausea Only  . Sulfonamide Derivatives Other (See Comments)    Reaction:  Unknown   . Synthroid [Levothyroxine Sodium]   . Copaxone  [Glatiramer] Rash  . Interferon Beta-1a Other (See Comments) and Rash    Reaction:  Unknown     Family History: Family History  Problem Relation Age of Onset  . Arthritis Sister   .  Aneurysm Brother   . Heart disease Daughter   . ALS Mother   . Colon cancer    . Kidney disease Neg  Hx     Social History:  reports that she has never smoked. She has never used smokeless tobacco. She reports that she does not drink alcohol or use illicit drugs.  ROS: UROLOGY Frequent Urination?: No Hard to postpone urination?: No Burning/pain with urination?: No Get up at night to urinate?: Yes Leakage of urine?: Yes Urine stream starts and stops?: No Trouble starting stream?: No Do you have to strain to urinate?: No Blood in urine?: No Urinary tract infection?: No Sexually transmitted disease?: No Injury to kidneys or bladder?: No Painful intercourse?: No Weak stream?: No Currently pregnant?: No Vaginal bleeding?: No Last menstrual period?: n  Gastrointestinal Nausea?: No Vomiting?: No Indigestion/heartburn?: Yes Diarrhea?: Yes Constipation?: Yes  Constitutional Fever: No Night sweats?: Yes Weight loss?: No Fatigue?: Yes  Skin Skin rash/lesions?: No Itching?: No  Eyes Blurred vision?: Yes Double vision?: No  Ears/Nose/Throat Sore throat?: Yes Sinus problems?: Yes  Hematologic/Lymphatic Swollen glands?: No Easy bruising?: No  Cardiovascular Leg swelling?: Yes Chest pain?: No  Respiratory Cough?: No Shortness of breath?: No  Endocrine Excessive thirst?: No  Musculoskeletal Back pain?: Yes Joint pain?: Yes  Neurological Headaches?: Yes Dizziness?: Yes  Psychologic Depression?: Yes Anxiety?: Yes  Physical Exam: Blood pressure 174/85, pulse 70, height $RemoveBe'5\' 2"'VKiTyTRQs$  (1.575 m), weight 156 lb 4.8 oz (70.897 kg).  Laboratory Data: Lab Results  Component Value Date   WBC 4.7 04/18/2015   HGB 9.7* 01/06/2015   HCT 39.9 04/18/2015   MCV 75.6* 01/06/2015   PLT 207 01/06/2015   Lab Results  Component Value Date   CREATININE 1.03* 04/18/2015   Lab Results  Component Value Date   HGBA1C 6.5 04/15/2015   Urinalysis: Results for orders  placed or performed in visit on 06/14/15  Microscopic Examination  Result Value Ref Range   WBC, UA 0-5 0 -  5 /hpf   RBC, UA 0-2 0 -  2 /hpf   Epithelial Cells (non renal) 0-10 0 - 10 /hpf   Casts Present (A) None seen /lpf   Cast Type Hyaline casts N/A   Crystals Present (A) N/A   Crystal Type Calcium Oxalate N/A   Mucus, UA Present (A) Not Estab.   Bacteria, UA None seen None seen/Few  Urinalysis, Complete  Result Value Ref Range   Specific Gravity, UA 1.025 1.005 - 1.030   pH, UA 5.0 5.0 - 7.5   Color, UA Yellow Yellow   Appearance Ur Clear Clear   Leukocytes, UA Negative Negative   Protein, UA Negative Negative/Trace   Glucose, UA Negative Negative   Ketones, UA Negative Negative   RBC, UA 1+ (A) Negative   Bilirubin, UA Negative Negative   Urobilinogen, Ur 0.2 0.2 - 1.0 mg/dL   Nitrite, UA Negative Negative   Microscopic Examination See below:    Assessment & Plan:    1. Interstitial cystitis:   Patient had a rescue solutions instilled today. She tolerated the procedure well. She will return next week for another rescue solution installation.   - Urinalysis, Complete - heparin-lidocaine-sod bicarb bladder irrigation (Bladder rescue for bladder instillation procedures); Irrigate with 11 mLs as directed once.  2. Suprapubic pain:    Patient did undergo a cystoscopic examination on October 2015.  No GU pathology was identified.  3. Right flank pain:   Obtain a KUB today.    Return in about 1 week (around 06/21/2015) for rescue solution.  Zara Council, [facility] [address] [place], [place] [address] [phone]  [phone]

## 2015-06-14 NOTE — Progress Notes (Signed)
Bladder Rescue Solution Instillation  Due to interstitial cystitis patient is present today for a Rescue Solution Treatment.  Patient was cleaned and prepped in a sterile fashion with betadine and lidocaine 2% jelly was instilled into the urethra.  A 14 FR catheter was inserted, urine return was noted 79ml, urine was clear in color.  Instilled a solution consisting of 55ml of Sodium Bicarb, 2 ml Lidocaine and 1 ml of Heparin. The catheter was then removed. Patient tolerated well, no complications were noted.   Performed by: Lyndee Hensen CMA

## 2015-06-21 ENCOUNTER — Ambulatory Visit
Admission: RE | Admit: 2015-06-21 | Discharge: 2015-06-21 | Disposition: A | Discharge status: Home / Self Care | Payer: Commercial Managed Care - HMO | Source: Ambulatory Visit | Attending: Urology | Admitting: Urology

## 2015-06-21 ENCOUNTER — Ambulatory Visit (INDEPENDENT_AMBULATORY_CARE_PROVIDER_SITE_OTHER): Payer: Commercial Managed Care - HMO | Admitting: Urology

## 2015-06-21 ENCOUNTER — Encounter: Payer: Self-pay | Admitting: Urology

## 2015-06-21 VITALS — BP 156/80 | HR 67 | Temp 98.3°F | Ht 62.0 in | Wt 156.6 lb

## 2015-06-21 DIAGNOSIS — R109 Unspecified abdominal pain: Secondary | ICD-10-CM | POA: Diagnosis not present

## 2015-06-21 DIAGNOSIS — R1011 Right upper quadrant pain: Secondary | ICD-10-CM | POA: Diagnosis not present

## 2015-06-21 DIAGNOSIS — G8929 Other chronic pain: Secondary | ICD-10-CM | POA: Insufficient documentation

## 2015-06-21 DIAGNOSIS — N3010 Interstitial cystitis (chronic) without hematuria: Secondary | ICD-10-CM

## 2015-06-21 DIAGNOSIS — N3020 Other chronic cystitis without hematuria: Secondary | ICD-10-CM

## 2015-06-21 LAB — MICROSCOPIC EXAMINATION: Epithelial Cells (non renal): NONE SEEN /hpf (ref 0–10)

## 2015-06-21 MED ORDER — SODIUM BICARBONATE 8.4 % IV SOLN
11.0000 mL | Freq: Once | INTRAVENOUS | Status: AC
  Administered 2015-06-21: 11 mL

## 2015-06-21 NOTE — Progress Notes (Signed)
Bladder Rescue Solution Instillation  Due to IC patient is present today for a Rescue Solution Treatment. Patient was cleaned and prepped in a sterile fashion with betadine and lidocaine 2% jelly was instilled into the urethra. A 14 FR catheter was inserted, urine return was noted 63ml, urine was yellow in color. Instilled a solution consisting of 75ml of Sodium Bicarb, 2 ml Lidocaine and 1 ml of Heparin. The catheter was then removed. Patient tolerated well, no complications were noted.   Performed by: Donnie Mesa, CMA  Follow up/ Additional Notes: PRN

## 2015-06-21 NOTE — Progress Notes (Signed)
4:52 PM   [name] [birth year] [id number]  Referring provider: Bobetta Lime, MD [address] [place], [place] [address]  Chief Complaint  Patient presents with  . Interstitial cystitis    HPI:  Patient is an 81 year old white female with a history of interstitial cystitis who presents today for rescue installation.  She has no urinary complaints today. She denies hematuria, fever, chills, nausea or vomiting. Her UA today is   She states she had no difficulty with the last rescue solution.  She is finding benefit and pain relief from the rescue solutions and will like to continue these at this time.    She did not get her KUB after her last visit.  We will get that today.  She is still having pain in her right hip area.   PMH: Past Medical History  Diagnosis Date  . GERD (gastroesophageal reflux disease)   . Vertigo   . Essential hypertension, benign   . Paroxysmal supraventricular tachycardia ([place])   . Pure hypercholesterolemia   . MS (multiple sclerosis) ([place])   . Fibromyalgia   . Diverticulosis   . Diabetes mellitus without complication ([place])   . Osteoarthritis   . Dyslipidemia   . SOB (shortness of breath)     SECONDARY TO REFLUX  . Chest pain     SECONDARY TO GERD  . Peripheral neuropathy (HCC)     MILD  . Cancer ([place])     breast  . Depression   . Chronic interstitial cystitis   . History of fractured rib   . Chronic right hip pain   . History of TIA (transient ischemic attack)   . Carpal tunnel syndrome   . Thyroid nodule   . Hearing loss of left ear   . Vitamin B 12 deficiency   . Protein malnutrition ([place])   . Frequent falls   . Anxiety and depression   . Irritable bowel   . Recurrent UTI     Surgical History: Past Surgical History  Procedure Laterality Date  . Total abdominal hysterectomy w/ bilateral salpingoophorectomy    . Mastectomy Right     DX MASTITIS/NO CANCER.Marland KitchenSALINE IMPLANT  . Cholecystectomy    . Hemorrhoid  surgery      WITH RECONSTRUCTION  . Bladder tacking      X 3  . Eye surgeries      WITH BUCKLE DETACHMENT OF THE RETINA  . Tonsillectomy    . Appendectomy    . Tympanoplasty    . Breast surgery    . Mastectomy    . Breast lumpectomy    . Colonoscopy with propofol N/A 01/21/2015    Procedure: COLONOSCOPY WITH PROPOFOL;  Surgeon: Josefine Class, MD;  Location: [facility] ENDOSCOPY;  Service: Endoscopy;  Laterality: N/A;  . Esophagogastroduodenoscopy N/A 01/21/2015    Procedure: ESOPHAGOGASTRODUODENOSCOPY (EGD);  Surgeon: Josefine Class, MD;  Location: [facility] ENDOSCOPY;  Service: Endoscopy;  Laterality: N/A;    Home Medications:    Medication List       This list is accurate as of: 06/21/15 11:59 PM.  Always use your most recent med list.               acetic acid-aluminum acetate 2 % otic solution  Commonly known as:  DOMEBORO OTIC  every 3 (three) hours.     CRESTOR 5 MG tablet  Generic drug:  rosuvastatin  Take 2.5 mg by mouth.     [employer]S VITAMIN B12 2000 MCG  tablet  Generic drug:  cyanocobalamin  Take by mouth.     dicyclomine 10 MG capsule  Commonly known as:  BENTYL  TAKE 1 CAPSULE (10 MG TOTAL) BY MOUTH EVERY 8 (EIGHT) HOURS AS NEEDED.     doxycycline 100 MG tablet  Commonly known as:  VIBRA-TABS  Take 1 tablet (100 mg total) by mouth 2 (two) times daily.     ferrous fumarate 325 (106 FE) MG Tabs tablet  Commonly known as:  HEMOCYTE - 106 mg FE  Take 1 tablet by mouth.     ferrous sulfate 325 (65 FE) MG tablet  Take by mouth.     glucose blood test strip  1 each by Other route as needed for other. Use as instructed     meclizine 25 MG tablet  Commonly known as:  ANTIVERT  TAKE ONE TO TWO TABLETS BY MOUTH EVERY 8 HOURS AS NEEDED     metFORMIN 500 MG tablet  Commonly known as:  GLUCOPHAGE  Take by mouth.     omeprazole 20 MG capsule  Commonly known as:  PRILOSEC  Take 20 mg by mouth daily.     oxyCODONE-acetaminophen 7.5-325 MG tablet  Commonly  known as:  PERCOCET  Take 1 tablet by mouth every 4 (four) hours as needed for severe pain.     PROBIOTIC COLON SUPPORT Caps  Take by mouth.     sertraline 50 MG tablet  Commonly known as:  ZOLOFT  TAKE ONE TABLET BY MOUTH ONE TIME DAILY     URIBEL 118 MG Caps  One tablet four times daily as needed with 8 oz of water     VITAMIN D-1000 MAX ST 1000 UNITS tablet  Generic drug:  Cholecalciferol  Take by mouth.        Allergies:  Allergies  Allergen Reactions  . Biaxin [Clarithromycin] Other (See Comments) and Diarrhea  . Copaxone [Glatiramer Acetate]   . Decadron [Dexamethasone] Nausea And Vomiting and Other (See Comments)    Other reaction(s): Muscle Pain Reaction:  Abdominal pain  . Diltiazem Hcl Other (See Comments)    Reaction:  Unknown   . Diltiazem Hcl Other (See Comments)  . Flagyl [Metronidazole] Nausea Only and Diarrhea    Other reaction(s): Vomiting  . Gabapentin Other (See Comments)    "burning all over" Reaction:  Unknown   . Iohexol Swelling and Other (See Comments)     Desc: tongue swelling with "IVP dye" sccording to nurses notes with a lumbar myelo-12/09- asm, Onset Date: [id number]  Pts tongue swells.  Clementeen Hoof [Iodinated Diagnostic Agents] Swelling and Other (See Comments)    Passed out  Pts tongue swells.   . Levothyroxine Nausea Only  . Sulfonamide Derivatives Other (See Comments)    Reaction:  Unknown   . Synthroid [Levothyroxine Sodium]   . Copaxone  [Glatiramer] Rash  . Interferon Beta-1a Other (See Comments) and Rash    Reaction:  Unknown     Family History: Family History  Problem Relation Age of Onset  . Arthritis Sister   . Aneurysm Brother   . Heart disease Daughter   . ALS Mother   . Colon cancer    . Kidney disease Neg Hx     Social History:  reports that she has never smoked. She has never used smokeless tobacco. She reports that she does not drink alcohol or use illicit drugs.  ROS: UROLOGY Frequent Urination?: No Hard to  postpone urination?: No Burning/pain with urination?: Yes Get  up at night to urinate?: Yes Leakage of urine?: Yes Urine stream starts and stops?: No Trouble starting stream?: No Do you have to strain to urinate?: No Blood in urine?: No Urinary tract infection?: No Sexually transmitted disease?: No Injury to kidneys or bladder?: No Painful intercourse?: No Weak stream?: No Currently pregnant?: No Vaginal bleeding?: No Last menstrual period?: n  Gastrointestinal Nausea?: No Vomiting?: No Indigestion/heartburn?: No Diarrhea?: Yes Constipation?: Yes  Constitutional Fever: No Night sweats?: Yes Weight loss?: No Fatigue?: Yes  Skin Skin rash/lesions?: No Itching?: No  Eyes Blurred vision?: Yes Double vision?: No  Ears/Nose/Throat Sore throat?: No Sinus problems?: Yes  Hematologic/Lymphatic Swollen glands?: No Easy bruising?: No  Cardiovascular Leg swelling?: Yes Chest pain?: No  Respiratory Cough?: No Shortness of breath?: No  Endocrine Excessive thirst?: No  Musculoskeletal Back pain?: Yes Joint pain?: Yes  Neurological Headaches?: Yes Dizziness?: Yes  Psychologic Depression?: Yes Anxiety?: Yes  Physical Exam: Blood pressure 156/80, pulse 67, temperature 98.3 F (36.8 C), temperature source Oral, height 5' 2"  (1.575 m), weight 156 lb 9.6 oz (71.033 kg).  Laboratory Data: Lab Results  Component Value Date   WBC 4.7 04/18/2015   HGB 9.7* 01/06/2015   HCT 39.9 04/18/2015   MCV 75.6* 01/06/2015   PLT 207 01/06/2015   Lab Results  Component Value Date   CREATININE 1.03* 04/18/2015   Lab Results  Component Value Date   HGBA1C 6.5 04/15/2015   Urinalysis: Results for orders placed or performed in visit on 06/21/15  Microscopic Examination  Result Value Ref Range   RBC, UA 6-10 (A) 0 -  2 /hpf   Epithelial Cells (non renal) None seen 0 - 10 /hpf   Renal Epithel, UA 0-10 None seen /hpf   Mucus, UA Present (A) Not Estab.   Bacteria,  UA Few (A) None seen/Few  Urinalysis, Complete  Result Value Ref Range   Specific Gravity, UA 1.025 1.005 - 1.030   pH, UA 5.0 5.0 - 7.5   Color, UA Yellow Yellow   Appearance Ur Clear Clear   Leukocytes, UA Negative Negative   Protein, UA Trace (A) Negative/Trace   Glucose, UA Negative Negative   Ketones, UA Trace (A) Negative   RBC, UA Trace (A) Negative   Bilirubin, UA Negative Negative   Urobilinogen, Ur 0.2 0.2 - 1.0 mg/dL   Nitrite, UA Negative Negative   Microscopic Examination See below:    Assessment & Plan:    1. Interstitial cystitis:   Patient had a rescue solutions instilled today. She tolerated the procedure well. She will return next week for another rescue solution installation.   - Urinalysis, Complete - heparin-lidocaine-sod bicarb bladder irrigation (Bladder rescue for bladder instillation procedures); Irrigate with 11 mLs as directed once.  2. Suprapubic pain:    Patient did undergo a cystoscopic examination on October 2015.  No GU pathology was identified.  3. Right flank pain:   Obtain a KUB today.    Return in about 1 week (around 06/28/2015) for rescue solution.  Zara Council, [facility] [address] [place], [place] [address] [phone]

## 2015-06-22 ENCOUNTER — Telehealth: Payer: Self-pay

## 2015-06-22 NOTE — Telephone Encounter (Signed)
Spoke with pt in reference to stone. Pt voiced understanding. 

## 2015-06-22 NOTE — Telephone Encounter (Signed)
-----   Message from Nori Riis, PA-C sent at 06/22/2015  8:30 AM EST ----- Please tell the patient she does not have a stone.

## 2015-06-24 LAB — URINALYSIS, COMPLETE: RBC MICROSCOPIC, UA: NONE SEEN /HPF (ref 0–?)

## 2015-06-28 ENCOUNTER — Ambulatory Visit (INDEPENDENT_AMBULATORY_CARE_PROVIDER_SITE_OTHER): Payer: Commercial Managed Care - HMO | Admitting: Urology

## 2015-06-28 ENCOUNTER — Encounter: Payer: Self-pay | Admitting: Urology

## 2015-06-28 ENCOUNTER — Ambulatory Visit: Payer: Commercial Managed Care - HMO | Admitting: Urology

## 2015-06-28 VITALS — BP 176/90 | HR 75 | Ht 61.0 in | Wt 156.3 lb

## 2015-06-28 DIAGNOSIS — N3010 Interstitial cystitis (chronic) without hematuria: Secondary | ICD-10-CM

## 2015-06-28 LAB — MICROSCOPIC EXAMINATION: RBC MICROSCOPIC, UA: NONE SEEN /HPF (ref 0–?)

## 2015-06-28 LAB — URINALYSIS, COMPLETE
BILIRUBIN UA: NEGATIVE
Glucose, UA: NEGATIVE
Ketones, UA: NEGATIVE
NITRITE UA: NEGATIVE
PH UA: 5 (ref 5.0–7.5)
Specific Gravity, UA: >1.03 — ABNORMAL HIGH (ref 1.005–1.030)
UUROB: 0.2 mg/dL (ref 0.2–1.0)

## 2015-06-28 MED ORDER — LIDOCAINE HCL 2 % EX GEL
1.0000 "application " | Freq: Once | CUTANEOUS | Status: AC
  Administered 2015-06-28: 1 via URETHRAL

## 2015-06-28 MED ORDER — SODIUM BICARBONATE 8.4 % IV SOLN
11.0000 mL | Freq: Once | INTRAVENOUS | Status: AC
  Administered 2015-06-28: 11 mL

## 2015-06-28 NOTE — Progress Notes (Signed)
2:39 PM   [name] 03/24/1930 [id number]  Referring provider: Bobetta Lime, MD [address] [place], [place] [address]  Chief Complaint  Patient presents with  . Interstitial cystitis    Rescue solution    HPI: Patient is an 81 year old white female with a history of interstitial cystitis who presents today for rescue installation.  She has no urinary complaints today. She denies hematuria, fever, chills, nausea or vomiting.  Her UA today is negative for infection.  She states she had no difficulty with the last rescue solution.  She is finding benefit and pain relief from the rescue solutions and will like to continue these at this time.    Now her pain is in the lower back and radiating through both hips and into the suprapubic area.  KUB taken on 06/21/2015 did not identify a kidney stone.  She did have hardware for fusion at L4-5 and DJD at L3-4.  I have reviewed the films.    PMH: Past Medical History  Diagnosis Date  . GERD (gastroesophageal reflux disease)   . Vertigo   . Essential hypertension, benign   . Paroxysmal supraventricular tachycardia ([place])   . Pure hypercholesterolemia   . MS (multiple sclerosis) ([place])   . Fibromyalgia   . Diverticulosis   . Diabetes mellitus without complication ([place])   . Osteoarthritis   . Dyslipidemia   . SOB (shortness of breath)     SECONDARY TO REFLUX  . Chest pain     SECONDARY TO GERD  . Peripheral neuropathy (HCC)     MILD  . Cancer ([place])     breast  . Depression   . Chronic interstitial cystitis   . History of fractured rib   . Chronic right hip pain   . History of TIA (transient ischemic attack)   . Carpal tunnel syndrome   . Thyroid nodule   . Hearing loss of left ear   . Vitamin B 12 deficiency   . Protein malnutrition ([place])   . Frequent falls   . Anxiety and depression   . Irritable bowel   . Recurrent UTI     Surgical History: Past Surgical History  Procedure Laterality Date  .  Total abdominal hysterectomy w/ bilateral salpingoophorectomy    . Mastectomy Right     DX MASTITIS/NO CANCER.Marland KitchenSALINE IMPLANT  . Cholecystectomy    . Hemorrhoid surgery      WITH RECONSTRUCTION  . Bladder tacking      X 3  . Eye surgeries      WITH BUCKLE DETACHMENT OF THE RETINA  . Tonsillectomy    . Appendectomy    . Tympanoplasty    . Breast surgery    . Mastectomy    . Breast lumpectomy    . Colonoscopy with propofol N/A 01/21/2015    Procedure: COLONOSCOPY WITH PROPOFOL;  Surgeon: Josefine Class, MD;  Location: [facility] ENDOSCOPY;  Service: Endoscopy;  Laterality: N/A;  . Esophagogastroduodenoscopy N/A 01/21/2015    Procedure: ESOPHAGOGASTRODUODENOSCOPY (EGD);  Surgeon: Josefine Class, MD;  Location: [facility] ENDOSCOPY;  Service: Endoscopy;  Laterality: N/A;    Home Medications:    Medication List       This list is accurate as of: 06/28/15  2:39 PM.  Always use your most recent med list.               acetic acid-aluminum acetate 2 % otic solution  Commonly known as:  DOMEBORO OTIC  every 3 (  three) hours.     CRESTOR 5 MG tablet  Generic drug:  rosuvastatin  Take 2.5 mg by mouth.     CVS VITAMIN B12 2000 MCG tablet  Generic drug:  cyanocobalamin  Take by mouth.     dicyclomine 10 MG capsule  Commonly known as:  BENTYL  TAKE 1 CAPSULE (10 MG TOTAL) BY MOUTH EVERY 8 (EIGHT) HOURS AS NEEDED.     doxycycline 100 MG tablet  Commonly known as:  VIBRA-TABS  Take 1 tablet (100 mg total) by mouth 2 (two) times daily.     ferrous fumarate 325 (106 FE) MG Tabs tablet  Commonly known as:  HEMOCYTE - 106 mg FE  Take 1 tablet by mouth.     ferrous sulfate 325 (65 FE) MG tablet  Take by mouth.     glucose blood test strip  1 each by Other route as needed for other. Use as instructed     meclizine 25 MG tablet  Commonly known as:  ANTIVERT  TAKE ONE TO TWO TABLETS BY MOUTH EVERY 8 HOURS AS NEEDED     metFORMIN 500 MG tablet  Commonly known as:  GLUCOPHAGE    Take by mouth.     omeprazole 20 MG capsule  Commonly known as:  PRILOSEC  Take 20 mg by mouth daily.     oxyCODONE-acetaminophen 7.5-325 MG tablet  Commonly known as:  PERCOCET  Take 1 tablet by mouth every 4 (four) hours as needed for severe pain.     PROBIOTIC COLON SUPPORT Caps  Take by mouth.     sertraline 50 MG tablet  Commonly known as:  ZOLOFT  TAKE ONE TABLET BY MOUTH ONE TIME DAILY     URIBEL 118 MG Caps  One tablet four times daily as needed with 8 oz of water     VITAMIN D-1000 MAX ST 1000 UNITS tablet  Generic drug:  Cholecalciferol  Take by mouth.        Allergies:  Allergies  Allergen Reactions  . Biaxin [Clarithromycin] Other (See Comments) and Diarrhea  . Copaxone [Glatiramer Acetate]   . Decadron [Dexamethasone] Nausea And Vomiting and Other (See Comments)    Other reaction(s): Muscle Pain Reaction:  Abdominal pain  . Diltiazem Hcl Other (See Comments)    Reaction:  Unknown   . Diltiazem Hcl Other (See Comments)  . Flagyl [Metronidazole] Nausea Only and Diarrhea    Other reaction(s): Vomiting  . Gabapentin Other (See Comments)    "burning all over" Reaction:  Unknown   . Iohexol Swelling and Other (See Comments)     Desc: tongue swelling with "IVP dye" sccording to nurses notes with a lumbar myelo-12/09- asm, Onset Date: [id number]  Pts tongue swells.  Clementeen Hoof [Iodinated Diagnostic Agents] Swelling and Other (See Comments)    Passed out  Pts tongue swells.   . Levothyroxine Nausea Only  . Sulfonamide Derivatives Other (See Comments)    Reaction:  Unknown   . Synthroid [Levothyroxine Sodium]   . Copaxone  [Glatiramer] Rash  . Interferon Beta-1a Other (See Comments) and Rash    Reaction:  Unknown     Family History: Family History  Problem Relation Age of Onset  . Arthritis Sister   . Aneurysm Brother   . Heart disease Daughter   . ALS Mother   . Colon cancer    . Kidney disease Neg Hx     Social History:  reports that she has  never smoked. She has never  used smokeless tobacco. She reports that she does not drink alcohol or use illicit drugs.  ROS: UROLOGY Frequent Urination?: Yes Hard to postpone urination?: Yes Burning/pain with urination?: No Get up at night to urinate?: Yes Leakage of urine?: Yes Urine stream starts and stops?: No Trouble starting stream?: No Do you have to strain to urinate?: No Blood in urine?: No Urinary tract infection?: No Sexually transmitted disease?: No Injury to kidneys or bladder?: No Weak stream?: No Currently pregnant?: No Vaginal bleeding?: No Last menstrual period?: n  Gastrointestinal Nausea?: No Vomiting?: No Indigestion/heartburn?: Yes Diarrhea?: Yes Constipation?: Yes  Constitutional Fever: No Night sweats?: Yes Weight loss?: No Fatigue?: Yes  Skin Skin rash/lesions?: No Itching?: Yes  Eyes Blurred vision?: Yes Double vision?: Yes  Ears/Nose/Throat Sore throat?: Yes Sinus problems?: Yes  Hematologic/Lymphatic Swollen glands?: No Easy bruising?: No  Cardiovascular Leg swelling?: Yes Chest pain?: No  Respiratory Cough?: No Shortness of breath?: No  Endocrine Excessive thirst?: No  Musculoskeletal Back pain?: Yes Joint pain?: Yes  Neurological Headaches?: Yes Dizziness?: Yes  Psychologic Depression?: Yes Anxiety?: Yes  Physical Exam: Blood pressure 176/90, pulse 75, height _0  (1.549 m), weight 156 lb 4.8 oz (70.897 kg).  Laboratory Data: Lab Results  Component Value Date   WBC 4.7 04/18/2015   HGB 9.7* 01/06/2015   HCT 39.9 04/18/2015   MCV 75.6* 01/06/2015   PLT 207 01/06/2015   Lab Results  Component Value Date   CREATININE 1.03* 04/18/2015   Lab Results  Component Value Date   HGBA1C 6.5 04/15/2015   Urinalysis: Results for orders placed or performed in visit on 06/28/15  Microscopic Examination  Result Value Ref Range   WBC, UA 6-10 (A) 0 -  5 /hpf   RBC, UA None seen 0 -  2 /hpf   Epithelial Cells  (non renal) 0-10 0 - 10 /hpf   Mucus, UA Present (A) Not Estab.   Bacteria, UA Few (A) None seen/Few  Urinalysis, Complete  Result Value Ref Range   Specific Gravity, UA >1.030 (H) 1.005 - 1.030   pH, UA 5.0 5.0 - 7.5   Color, UA Yellow Yellow   Appearance Ur Clear Clear   Leukocytes, UA Trace (A) Negative   Protein, UA Trace (A) Negative/Trace   Glucose, UA Negative Negative   Ketones, UA Negative Negative   RBC, UA Trace (A) Negative   Bilirubin, UA Negative Negative   Urobilinogen, Ur 0.2 0.2 - 1.0 mg/dL   Nitrite, UA Negative Negative   Microscopic Examination See below:    Assessment & Plan:    1. Interstitial cystitis:   Patient had a rescue solutions instilled today. She tolerated the procedure well. She will return next week for another rescue solution installation.   - Urinalysis, Complete - heparin-lidocaine-sod bicarb bladder irrigation (Bladder rescue for bladder instillation procedures); Irrigate with 11 mLs as directed once.  2. Suprapubic pain:    Patient did undergo a cystoscopic examination on October 2015.  No GU pathology was identified.  3. Right flank pain:   KUB negative for stones.   Return in about 1 week (around 07/05/2015) for rescue solution.  Zara Council, [facility] [address] [place], [place] [address] [phone]

## 2015-07-04 DIAGNOSIS — R1084 Generalized abdominal pain: Secondary | ICD-10-CM | POA: Diagnosis not present

## 2015-07-04 DIAGNOSIS — D509 Iron deficiency anemia, unspecified: Secondary | ICD-10-CM | POA: Diagnosis not present

## 2015-07-05 ENCOUNTER — Encounter: Payer: Self-pay | Admitting: Urology

## 2015-07-05 ENCOUNTER — Ambulatory Visit (INDEPENDENT_AMBULATORY_CARE_PROVIDER_SITE_OTHER): Payer: Commercial Managed Care - HMO | Admitting: Urology

## 2015-07-05 VITALS — BP 171/77 | HR 74 | Ht 61.0 in | Wt 157.3 lb

## 2015-07-05 DIAGNOSIS — N3010 Interstitial cystitis (chronic) without hematuria: Secondary | ICD-10-CM

## 2015-07-05 LAB — URINALYSIS, COMPLETE
Bilirubin, UA: NEGATIVE
Glucose, UA: NEGATIVE
KETONES UA: NEGATIVE
LEUKOCYTES UA: NEGATIVE
Nitrite, UA: NEGATIVE
Protein, UA: NEGATIVE
SPEC GRAV UA: 1.025 (ref 1.005–1.030)
Urobilinogen, Ur: 0.2 mg/dL (ref 0.2–1.0)
pH, UA: 5 (ref 5.0–7.5)

## 2015-07-05 LAB — MICROSCOPIC EXAMINATION
BACTERIA UA: NONE SEEN
RBC MICROSCOPIC, UA: NONE SEEN /HPF (ref 0–?)

## 2015-07-05 MED ORDER — LIDOCAINE HCL 2 % IJ SOLN
11.0000 mL | Freq: Once | INTRAMUSCULAR | Status: AC
  Administered 2015-07-05: 11 mL

## 2015-07-05 MED ORDER — LIDOCAINE HCL 2 % EX GEL
1.0000 "application " | Freq: Once | CUTANEOUS | Status: AC
  Administered 2015-07-05: 1 via URETHRAL

## 2015-07-05 NOTE — Progress Notes (Signed)
3:45 PM   [name] October 03, 1929 [id number]  Referring provider: Bobetta Lime, MD [address] [place], [place] [address]  Chief Complaint  Patient presents with  . Interstitial Cystitis    Rescue Solution    HPI: Patient is an 81 year old white female with a history of interstitial cystitis who presents today for rescue installation.  She has no urinary complaints today. She denies hematuria, fever, chills, nausea or vomiting.  Her UA today is negative for infection.  She states she had no difficulty with the last rescue solution.  She is finding benefit and pain relief from the rescue solutions and will like to continue these at this time.    She has seen her gastroenterologist for the bilateral hip pain.  They do not feel it is due to diverticulosis and they have referred her to physical therapy for her back.    PMH: Past Medical History  Diagnosis Date  . GERD (gastroesophageal reflux disease)   . Vertigo   . Essential hypertension, benign   . Paroxysmal supraventricular tachycardia ([place])   . Pure hypercholesterolemia   . MS (multiple sclerosis) ([place])   . Fibromyalgia   . Diverticulosis   . Diabetes mellitus without complication ([place])   . Osteoarthritis   . Dyslipidemia   . SOB (shortness of breath)     SECONDARY TO REFLUX  . Chest pain     SECONDARY TO GERD  . Peripheral neuropathy (HCC)     MILD  . Cancer ([place])     breast  . Depression   . Chronic interstitial cystitis   . History of fractured rib   . Chronic right hip pain   . History of TIA (transient ischemic attack)   . Carpal tunnel syndrome   . Thyroid nodule   . Hearing loss of left ear   . Vitamin B 12 deficiency   . Protein malnutrition ([place])   . Frequent falls   . Anxiety and depression   . Irritable bowel   . Recurrent UTI     Surgical History: Past Surgical History  Procedure Laterality Date  . Total abdominal hysterectomy w/ bilateral salpingoophorectomy    .  Mastectomy Right     DX MASTITIS/NO CANCER.Marland KitchenSALINE IMPLANT  . Cholecystectomy    . Hemorrhoid surgery      WITH RECONSTRUCTION  . Bladder tacking      X 3  . Eye surgeries      WITH BUCKLE DETACHMENT OF THE RETINA  . Tonsillectomy    . Appendectomy    . Tympanoplasty    . Breast surgery    . Mastectomy    . Breast lumpectomy    . Colonoscopy with propofol N/A 01/21/2015    Procedure: COLONOSCOPY WITH PROPOFOL;  Surgeon: Josefine Class, MD;  Location: [facility] ENDOSCOPY;  Service: Endoscopy;  Laterality: N/A;  . Esophagogastroduodenoscopy N/A 01/21/2015    Procedure: ESOPHAGOGASTRODUODENOSCOPY (EGD);  Surgeon: Josefine Class, MD;  Location: [facility] ENDOSCOPY;  Service: Endoscopy;  Laterality: N/A;    Home Medications:    Medication List       This list is accurate as of: 07/05/15  3:45 PM.  Always use your most recent med list.               acetic acid-aluminum acetate 2 % otic solution  Commonly known as:  DOMEBORO OTIC  every 3 (three) hours.     CRESTOR 5 MG tablet  Generic drug:  rosuvastatin  Take  2.5 mg by mouth.     [employer] VITAMIN B12 2000 MCG tablet  Generic drug:  cyanocobalamin  Take by mouth.     dicyclomine 10 MG capsule  Commonly known as:  BENTYL  TAKE 1 CAPSULE (10 MG TOTAL) BY MOUTH EVERY 8 (EIGHT) HOURS AS NEEDED.     doxycycline 100 MG tablet  Commonly known as:  VIBRA-TABS  Take 1 tablet (100 mg total) by mouth 2 (two) times daily.     ferrous fumarate 325 (106 FE) MG Tabs tablet  Commonly known as:  HEMOCYTE - 106 mg FE  Take 1 tablet by mouth.     ferrous sulfate 325 (65 FE) MG tablet  Take by mouth.     glucose blood test strip  1 each by Other route as needed for other. Use as instructed     meclizine 25 MG tablet  Commonly known as:  ANTIVERT  TAKE ONE TO TWO TABLETS BY MOUTH EVERY 8 HOURS AS NEEDED     metFORMIN 500 MG tablet  Commonly known as:  GLUCOPHAGE  Take by mouth.     omeprazole 20 MG capsule  Commonly known as:   PRILOSEC  Take 20 mg by mouth daily.     oxyCODONE-acetaminophen 7.5-325 MG tablet  Commonly known as:  PERCOCET  Take 1 tablet by mouth every 4 (four) hours as needed for severe pain.     PROBIOTIC COLON SUPPORT Caps  Take by mouth.     sertraline 50 MG tablet  Commonly known as:  ZOLOFT  TAKE ONE TABLET BY MOUTH ONE TIME DAILY     URIBEL 118 MG Caps  One tablet four times daily as needed with 8 oz of water     VITAMIN D-1000 MAX ST 1000 UNITS tablet  Generic drug:  Cholecalciferol  Take by mouth.        Allergies:  Allergies  Allergen Reactions  . Biaxin [Clarithromycin] Other (See Comments) and Diarrhea  . Copaxone [Glatiramer Acetate]   . Decadron [Dexamethasone] Nausea And Vomiting and Other (See Comments)    Other reaction(s): Muscle Pain Reaction:  Abdominal pain  . Diltiazem Hcl Other (See Comments)    Reaction:  Unknown   . Diltiazem Hcl Other (See Comments)  . Flagyl [Metronidazole] Nausea Only and Diarrhea    Other reaction(s): Vomiting  . Gabapentin Other (See Comments)    "burning all over" Reaction:  Unknown   . Iohexol Swelling and Other (See Comments)     Desc: tongue swelling with "IVP dye" sccording to nurses notes with a lumbar myelo-12/09- asm, Onset Date: [id number]  Pts tongue swells.  Clementeen Hoof [Iodinated Diagnostic Agents] Swelling and Other (See Comments)    Passed out  Pts tongue swells.   . Levothyroxine Nausea Only  . Sulfonamide Derivatives Other (See Comments)    Reaction:  Unknown   . Synthroid [Levothyroxine Sodium]   . Copaxone  [Glatiramer] Rash  . Interferon Beta-1a Other (See Comments) and Rash    Reaction:  Unknown     Family History: Family History  Problem Relation Age of Onset  . Arthritis Sister   . Aneurysm Brother   . Heart disease Daughter   . ALS Mother   . Colon cancer    . Kidney disease Neg Hx     Social History:  reports that she has never smoked. She has never used smokeless tobacco. She reports that she  does not drink alcohol or use illicit drugs.  ROS: UROLOGY  Frequent Urination?: Yes Hard to postpone urination?: Yes Burning/pain with urination?: No Get up at night to urinate?: Yes Leakage of urine?: Yes Urine stream starts and stops?: No Trouble starting stream?: No Do you have to strain to urinate?: No Blood in urine?: No Urinary tract infection?: No Sexually transmitted disease?: No Injury to kidneys or bladder?: No Painful intercourse?: No Weak stream?: No Currently pregnant?: No Vaginal bleeding?: No Last menstrual period?: n  Gastrointestinal Nausea?: No Vomiting?: No Indigestion/heartburn?: Yes Diarrhea?: Yes Constipation?: Yes  Constitutional Fever: No Night sweats?: Yes Weight loss?: No Fatigue?: Yes  Skin Skin rash/lesions?: No Itching?: Yes  Eyes Blurred vision?: Yes Double vision?: Yes  Ears/Nose/Throat Sore throat?: Yes Sinus problems?: Yes  Hematologic/Lymphatic Swollen glands?: No Easy bruising?: No  Cardiovascular Leg swelling?: Yes Chest pain?: No  Respiratory Cough?: No Shortness of breath?: No  Endocrine Excessive thirst?: No  Musculoskeletal Back pain?: Yes Joint pain?: Yes  Neurological Headaches?: Yes Dizziness?: Yes  Psychologic Depression?: Yes Anxiety?: Yes  Physical Exam: Blood pressure 171/77, pulse 74, height $RemoveBe'5\' 1"'ilapvCesz$  (1.549 m), weight 157 lb 4.8 oz (71.351 kg). Constitutional: Well nourished. Alert and oriented, No acute distress. HEENT: [place] AT, moist mucus membranes. Trachea midline, no masses. Cardiovascular: No clubbing, cyanosis, or edema. Respiratory: Normal respiratory effort, no increased work of breathing. Skin: No rashes, bruises or suspicious lesions. Lymph: No cervical or inguinal adenopathy. Neurologic: Grossly intact, no focal deficits, moving all 4 extremities. Psychiatric: Normal mood and affect.   Laboratory Data: Lab Results  Component Value Date   WBC 4.7 04/18/2015   HGB 9.7*  01/06/2015   HCT 39.9 04/18/2015   MCV 75.6* 01/06/2015   PLT 207 01/06/2015   Lab Results  Component Value Date   CREATININE 1.03* 04/18/2015   Lab Results  Component Value Date   HGBA1C 6.5 04/15/2015   Urinalysis: Results for orders placed or performed in visit on 07/05/15  Microscopic Examination  Result Value Ref Range   WBC, UA 0-5 0 -  5 /hpf   RBC, UA None seen 0 -  2 /hpf   Epithelial Cells (non renal) 0-10 0 - 10 /hpf   Mucus, UA Present (A) Not Estab.   Bacteria, UA None seen None seen/Few  Urinalysis, Complete  Result Value Ref Range   Specific Gravity, UA 1.025 1.005 - 1.030   pH, UA 5.0 5.0 - 7.5   Color, UA Yellow Yellow   Appearance Ur Clear Clear   Leukocytes, UA Negative Negative   Protein, UA Negative Negative/Trace   Glucose, UA Negative Negative   Ketones, UA Negative Negative   RBC, UA Trace (A) Negative   Bilirubin, UA Negative Negative   Urobilinogen, Ur 0.2 0.2 - 1.0 mg/dL   Nitrite, UA Negative Negative   Microscopic Examination See below:     Assessment & Plan:    1. Interstitial cystitis:   Patient had a rescue solutions instilled today. She tolerated the procedure well. She will return next week for another rescue solution installation. I have discussed her case with Dr. Matilde Sprang and he recommends rescue solutions three times weekly for three weeks and then reassess.  I will discuss this with her at her next appointment.   - Urinalysis, Complete - heparin-lidocaine-sod bicarb bladder irrigation (Bladder rescue for bladder instillation procedures); Irrigate with 11 mLs as directed once.  Return in about 1 week (around 07/12/2015) for rescue solution.  Zara Council, [facility] [address], [place], [place] [address] [phone]  [phone]

## 2015-07-07 ENCOUNTER — Ambulatory Visit: Payer: Commercial Managed Care - HMO | Attending: Gastroenterology | Admitting: Physical Therapy

## 2015-07-07 DIAGNOSIS — R296 Repeated falls: Secondary | ICD-10-CM | POA: Diagnosis not present

## 2015-07-07 DIAGNOSIS — M545 Low back pain, unspecified: Secondary | ICD-10-CM

## 2015-07-07 DIAGNOSIS — R52 Pain, unspecified: Secondary | ICD-10-CM

## 2015-07-07 NOTE — Therapy (Signed)
[facility] [address] [place], [address] Phone: [phone]   Fax:  [phone]  Physical Therapy Evaluation  Patient Details  Name: [name] MRN: [id number] Date of Birth: [birth year] Referring Provider: Arther Dames  Encounter Date: 07/07/2015      PT End of Session - 07/07/15 0948    Visit Number 1   Number of Visits 13   Date for PT Re-Evaluation 08/18/15   Authorization Type 1   Authorization Time Period 10   PT Start Time 0825   PT Stop Time 0925   PT Time Calculation (min) 60 min   Activity Tolerance Patient tolerated treatment well;Patient limited by pain   Behavior During Therapy [facility] for tasks assessed/performed      Past Medical History  Diagnosis Date  . GERD (gastroesophageal reflux disease)   . Vertigo   . Essential hypertension, benign   . Paroxysmal supraventricular tachycardia ([place])   . Pure hypercholesterolemia   . MS (multiple sclerosis) ([place])   . Fibromyalgia   . Diverticulosis   . Diabetes mellitus without complication ([place])   . Osteoarthritis   . Dyslipidemia   . SOB (shortness of breath)     SECONDARY TO REFLUX  . Chest pain     SECONDARY TO GERD  . Peripheral neuropathy (HCC)     MILD  . Cancer ([place])     breast  . Depression   . Chronic interstitial cystitis   . History of fractured rib   . Chronic right hip pain   . History of TIA (transient ischemic attack)   . Carpal tunnel syndrome   . Thyroid nodule   . Hearing loss of left ear   . Vitamin B 12 deficiency   . Protein malnutrition ([place])   . Frequent falls   . Anxiety and depression   . Irritable bowel   . Recurrent UTI     Past Surgical History  Procedure Laterality Date  . Total abdominal hysterectomy w/ bilateral salpingoophorectomy    . Mastectomy Right     DX MASTITIS/NO CANCER.Marland KitchenSALINE IMPLANT  . Cholecystectomy    . Hemorrhoid surgery      WITH RECONSTRUCTION  . Bladder tacking       X 3  . Eye surgeries      WITH BUCKLE DETACHMENT OF THE RETINA  . Tonsillectomy    . Appendectomy    . Tympanoplasty    . Breast surgery    . Mastectomy    . Breast lumpectomy    . Colonoscopy with propofol N/A 01/21/2015    Procedure: COLONOSCOPY WITH PROPOFOL;  Surgeon: Josefine Class, MD;  Location: [facility] ENDOSCOPY;  Service: Endoscopy;  Laterality: N/A;  . Esophagogastroduodenoscopy N/A 01/21/2015    Procedure: ESOPHAGOGASTRODUODENOSCOPY (EGD);  Surgeon: Josefine Class, MD;  Location: [facility] ENDOSCOPY;  Service: Endoscopy;  Laterality: N/A;    There were no vitals filed for this visit.  Visit Diagnosis:  Bilateral low back pain without sciatica  Falls frequently  Pain aggravated by walking      Subjective Assessment - 07/07/15 0925    Subjective Patient reports multiple falls, bilateral side to back pain making it difficult to walk.  Patient has various pains throughout body due to arthritis and prior surgeries.    Patient is accompained by: Family member   Limitations Walking;House hold activities   Patient Stated Goals "to not fall as much, to decrease pain, to be less dizzy."  Currently in Pain? Yes   Pain Score 0-No pain   Pain Location Back   Pain Orientation Posterior;Lateral;Lower   Pain Descriptors / Indicators Discomfort   Pain Type Chronic pain   Pain Onset More than a month ago   Pain Frequency Intermittent   Effect of Pain on Daily Activities difficult to perform activities of daily living.    Multiple Pain Sites No            [facility] PT Assessment - 07/07/15 0001    Assessment   Medical Diagnosis bilateral side pain   Referring Provider Arther Dames   Prior Therapy yes   Precautions   Precautions Fall   Precaution Comments multiple falls   Restrictions   Weight Bearing Restrictions No   Balance Screen   Has the patient fallen in the past 6 months Yes   How many times? multiple   Has the patient had a decrease in activity level  because of a fear of falling?  Yes   Is the patient reluctant to leave their home because of a fear of falling?  Yes   [place] residence   Living Arrangements Spouse/significant other   Type of [place] One level   [place] - 4 wheels   Prior Function   Level of Independence Independent with basic ADLs;Independent with household mobility with device   Vocation Retired   Associate Professor   Overall Cognitive Status Within Functional Limits for tasks assessed   Sensation   Light Touch Impaired by gross assessment   Semmes Weinstein Monofilament Scale Diminshed Light Touch   ROM / Strength   AROM / PROM / Strength AROM;Strength   AROM   Overall AROM  Deficits   AROM Assessment Site Other (comment)  patient has decreased cervical range of motion in all direct   Strength   Overall Strength Within functional limits for tasks performed   Overall Strength Comments --  grip strength weakened bilaterally   Transfers   Transfers Sit to Stand   Sit to Stand 5: Supervision;With upper extremity assist   Five time sit to stand comments  44 sec   Ambulation/Gait   Ambulation/Gait Yes   Ambulation/Gait Assistance 5: Supervision   Assistive device 4-wheeled walker   Gait Pattern Step-to pattern   Gait velocity 10 meters in 25 sec   Balance   Balance Assessed No       Treatment to include : Nu step level 1 x10 min for overall movement and pain reduction STS x 5, gait training with rollator x100', safety instruction.           PT Education - 07/07/15 0929    Education provided Yes   Education Details POC, exercise on Nu step   Person(s) Educated Patient   Methods Explanation   Comprehension Verbalized understanding;Returned demonstration             PT Long Term Goals - 07/07/15 0952    PT LONG TERM GOAL #1   Title Patient will demonstrate imrpoved functional mobility by improved  LEFS of > 40/80   Baseline 25/80   Time 6   Period Weeks   Status New   PT LONG TERM GOAL #2   Title Patient will demonstrate improved safety by improving gait speed to 10 meters in < 20 sec.    Baseline 25 sec   Time 6   Period Weeks  Status New   PT LONG TERM GOAL #3   Title Patient will demonstrate improved mobility by performing 5x STS in < 30 sec.    Baseline 44 sec   Time 6   Period Weeks   Status New               Plan - 2015-07-09 0949    Clinical Impression Statement patient is an 81 year old female who is sent to PT for bilateral side pain. Patient reports several falls in the last 6 months. She has multiple areas of pain and has difficulty with activities of daily living.    Pt will benefit from skilled therapeutic intervention in order to improve on the following deficits Abnormal gait;Decreased activity tolerance;Decreased balance;Decreased strength;Decreased mobility;Difficulty walking;Dizziness;Pain;Decreased safety awareness;Decreased endurance;Decreased range of motion   Rehab Potential Fair   PT Frequency 2x / week   PT Duration 6 weeks   PT Treatment/Interventions Moist Heat;Therapeutic exercise;Therapeutic activities;Gait training;Neuromuscular re-education;Balance training;Aquatic Therapy;Patient/family education;Manual techniques   Consulted and Agree with Plan of Care Patient          G-Codes - 2015/07/09 0956    Functional Assessment Tool Used LEFS, 5x STS, gait speed   Functional Limitation Mobility: Walking and moving around   Mobility: Walking and Moving Around Current Status [phone]) At least 40 percent but less than 60 percent impaired, limited or restricted   Mobility: Walking and Moving Around Goal Status [phone]) At least 20 percent but less than 40 percent impaired, limited or restricted       Problem List Patient Active Problem List   Diagnosis Date Noted  . Suprapubic pain, acute 05/08/2015  . Diarrhea 05/02/2015  . Urinary tract  infectious disease 05/02/2015  . Interstitial cystitis 04/25/2015  . Boil, face 04/22/2015  . Need for immunization against influenza 04/15/2015  . Recurrent UTI 04/09/2015  . Dysuria 04/01/2015  . Anxiety and depression 01/12/2014  . Carpal tunnel syndrome 01/12/2014  . DD (diverticular disease) 01/12/2014  . Carcinoma in situ, breast, ductal 01/12/2014  . H/O surgical procedure 01/12/2014  . H/O right mastectomy 01/12/2014  . Hypercholesteremia 01/12/2014  . DS (disseminated sclerosis) ([place]) 01/12/2014  . Arthritis, degenerative 01/12/2014  . Thyroid nodule 01/12/2014  . Cyanocobalamine deficiency (non anemic) 01/12/2014  . DM (diabetes mellitus) type II controlled, neurological manifestation ([place]) 06/26/2010  . Depression, major, recurrent, in partial remission ([place]) 06/26/2010  . Frequent falls 06/26/2010  . Stricture and stenosis of esophagus 06/26/2010  . Gastro-esophageal reflux disease without esophagitis 06/26/2010  . Fibromyalgia 06/26/2010  . Chronic interstitial cystitis 06/26/2010  . Acid reflux 06/26/2010  . H/O malignant neoplasm of breast 06/26/2010  . Hypertension goal BP (blood pressure) < 150/90 06/01/2009  . H/O paroxysmal supraventricular tachycardia 06/01/2009  . Paroxysmal supraventricular tachycardia ([place]) 06/01/2009  . Irritable bowel syndrome with constipation and diarrhea 06/16/2008  . Diarrhea, functional 06/16/2008  . D (diarrhea) 06/16/2008    Memphis Decoteau, PT, MPT, GCS 09-Jul-2015, 9:57 AM  [facility] [address], [place], [address] Phone: [phone]   Fax:  [phone]  Name: [name] MRN: [id number] Date of Birth: 1930/04/22

## 2015-07-07 NOTE — Addendum Note (Signed)
Addended by: Nelly Laurence on: 07/07/2015 11:22 AM   Modules accepted: Orders

## 2015-07-11 ENCOUNTER — Ambulatory Visit: Payer: Commercial Managed Care - HMO | Admitting: Physical Therapy

## 2015-07-11 DIAGNOSIS — R296 Repeated falls: Secondary | ICD-10-CM

## 2015-07-11 DIAGNOSIS — R52 Pain, unspecified: Secondary | ICD-10-CM | POA: Diagnosis not present

## 2015-07-11 DIAGNOSIS — M545 Low back pain, unspecified: Secondary | ICD-10-CM

## 2015-07-11 NOTE — Therapy (Signed)
[place] [facility] [address] [place], [address] Phone: [phone]   Fax:  [phone]  Physical Therapy Treatment  Patient Details  Name: [name] MRN: [id number] Date of Birth: [birth year] Referring Provider: Arther Dames  Encounter Date: 07/11/2015      PT End of Session - 07/11/15 1149    Visit Number 2   Number of Visits 13   Date for PT Re-Evaluation 08/18/15   Authorization Type 2   Authorization Time Period 10   PT Start Time 1030   PT Stop Time 1100   PT Time Calculation (min) 30 min   Activity Tolerance Patient tolerated treatment well;No increased pain   Behavior During Therapy [facility] for tasks assessed/performed      Past Medical History  Diagnosis Date  . GERD (gastroesophageal reflux disease)   . Vertigo   . Essential hypertension, benign   . Paroxysmal supraventricular tachycardia ([employer])   . Pure hypercholesterolemia   . MS (multiple sclerosis) ([place])   . Fibromyalgia   . Diverticulosis   . Diabetes mellitus without complication ([place])   . Osteoarthritis   . Dyslipidemia   . SOB (shortness of breath)     SECONDARY TO REFLUX  . Chest pain     SECONDARY TO GERD  . Peripheral neuropathy (HCC)     MILD  . Cancer ([place])     breast  . Depression   . Chronic interstitial cystitis   . History of fractured rib   . Chronic right hip pain   . History of TIA (transient ischemic attack)   . Carpal tunnel syndrome   . Thyroid nodule   . Hearing loss of left ear   . Vitamin B 12 deficiency   . Protein malnutrition ([place])   . Frequent falls   . Anxiety and depression   . Irritable bowel   . Recurrent UTI     Past Surgical History  Procedure Laterality Date  . Total abdominal hysterectomy w/ bilateral salpingoophorectomy    . Mastectomy Right     DX MASTITIS/NO CANCER.Marland KitchenSALINE IMPLANT  . Cholecystectomy    . Hemorrhoid surgery      WITH RECONSTRUCTION  . Bladder tacking      X 3   . Eye surgeries      WITH BUCKLE DETACHMENT OF THE RETINA  . Tonsillectomy    . Appendectomy    . Tympanoplasty    . Breast surgery    . Mastectomy    . Breast lumpectomy    . Colonoscopy with propofol N/A 01/21/2015    Procedure: COLONOSCOPY WITH PROPOFOL;  Surgeon: Josefine Class, MD;  [facility] ENDOSCOPY;  Service: Endoscopy;  Laterality: N/A;  . Esophagogastroduodenoscopy N/A 01/21/2015    Procedure: ESOPHAGOGASTRODUODENOSCOPY (EGD);  Surgeon: Josefine Class, MD;  Location[facility] ENDOSCOPY;  Service: Endoscopy;  Laterality: N/A;    There were no vitals filed for this visit.  Visit Diagnosis:  Bilateral low back pain without sciatica  Falls frequently  Pain aggravated by walking      Subjective Assessment - 07/11/15 1144    Subjective Patient reports she has a hard time walking, reports due to dizziness.    Patient is accompained by: Family member   Limitations Walking;House hold activities   Patient Stated Goals "to not fall as much, to decrease pain, to be less dizzy."           Treatment to include : Nu step level 2x5  min for overall movement and pain reduction STS x 10, Standing exercises to include: HR, Hip abd, marching. Seated Laq x 15 each. Standing balance exercises to include: tandem, narrow stance and standard stance x 30 sec - 1 min each. UE assist as needed To maintain balance. Close supervision as well for safety and balance.           PT Education - 07/11/15 1147    Education provided Yes   Education Details exercises, proper form. May need to be looked at by Vestibular therapist for balance /falls issues   Person(s) Educated Patient   Methods Explanation;Demonstration   Comprehension Verbalized understanding;Returned demonstration             PT Long Term Goals - 07/07/15 [id number]    PT LONG TERM GOAL #1   Title Patient will demonstrate imrpoved functional mobility by improved LEFS of > 40/80   Baseline 25/80   Time 6    Period Weeks   Status New   PT LONG TERM GOAL #2   Title Patient will demonstrate improved safety by improving gait speed to 10 meters in < 20 sec.    Baseline 25 sec   Time 6   Period Weeks   Status New   PT LONG TERM GOAL #3   Title Patient will demonstrate improved mobility by performing 5x STS in < 30 sec.    Baseline 44 sec   Time 6   Period Weeks   Status New               Plan - 07/11/15 1149    Clinical Impression Statement Patient reports continued dizziness, no falls over weekend, but difficulty walking. Pt may benefit from vestibular screening/treatment.    Pt will benefit from skilled therapeutic intervention in order to improve on the following deficits Abnormal gait;Decreased activity tolerance;Decreased balance;Decreased strength;Decreased mobility;Difficulty walking;Dizziness;Pain;Decreased safety awareness;Decreased endurance;Decreased range of motion   Rehab Potential Fair   PT Frequency 2x / week   PT Duration 6 weeks   PT Treatment/Interventions Moist Heat;Therapeutic exercise;Therapeutic activities;Gait training;Neuromuscular re-education;Balance training;Aquatic Therapy;Patient/family education;Manual techniques   Consulted and Agree with Plan of Care Patient        Problem List Patient Active Problem List   Diagnosis Date Noted  . Suprapubic pain, acute 05/08/2015  . Diarrhea 05/02/2015  . Urinary tract infectious disease 05/02/2015  . Interstitial cystitis 04/25/2015  . Boil, face 04/22/2015  . Need for immunization against influenza 04/15/2015  . Recurrent UTI 04/09/2015  . Dysuria 04/01/2015  . Anxiety and depression 01/12/2014  . Carpal tunnel syndrome 01/12/2014  . DD (diverticular disease) 01/12/2014  . Carcinoma in situ, breast, ductal 01/12/2014  . H/O surgical procedure 01/12/2014  . H/O right mastectomy 01/12/2014  . Hypercholesteremia 01/12/2014  . DS (disseminated sclerosis) ([place]) 01/12/2014  . Arthritis, degenerative  01/12/2014  . Thyroid nodule 01/12/2014  . Cyanocobalamine deficiency (non anemic) 01/12/2014  . DM (diabetes mellitus) type II controlled, neurological manifestation ([place]) 06/26/2010  . Depression, major, recurrent, in partial remission ([place]) 06/26/2010  . Frequent falls 06/26/2010  . Stricture and stenosis of esophagus 06/26/2010  . Gastro-esophageal reflux disease without esophagitis 06/26/2010  . Fibromyalgia 06/26/2010  . Chronic interstitial cystitis 06/26/2010  . Acid reflux 06/26/2010  . H/O malignant neoplasm of breast 06/26/2010  . Hypertension goal BP (blood pressure) < 150/90 06/01/2009  . H/O paroxysmal supraventricular tachycardia 06/01/2009  . Paroxysmal supraventricular tachycardia ([place]) 06/01/2009  . Irritable bowel syndrome with constipation and diarrhea 06/16/2008  .  Diarrhea, functional 06/16/2008  . D (diarrhea) 06/16/2008    Navy Belay, PT, MPT, GCS 07/11/2015, 12:01 PM  [place] [facility] [address], [place], [address] Phone: [phone]   Fax:  [phone]  Name: [name] MRN: [id number] Date of Birth: 1929/08/13

## 2015-07-12 ENCOUNTER — Encounter: Payer: Self-pay | Admitting: Urology

## 2015-07-12 ENCOUNTER — Ambulatory Visit (INDEPENDENT_AMBULATORY_CARE_PROVIDER_SITE_OTHER): Payer: Commercial Managed Care - HMO | Admitting: Urology

## 2015-07-12 ENCOUNTER — Other Ambulatory Visit: Payer: Self-pay

## 2015-07-12 VITALS — BP 165/85 | HR 76 | Ht 60.0 in | Wt 155.6 lb

## 2015-07-12 DIAGNOSIS — N390 Urinary tract infection, site not specified: Secondary | ICD-10-CM | POA: Diagnosis not present

## 2015-07-12 DIAGNOSIS — N3010 Interstitial cystitis (chronic) without hematuria: Secondary | ICD-10-CM

## 2015-07-12 DIAGNOSIS — R3129 Other microscopic hematuria: Secondary | ICD-10-CM

## 2015-07-12 LAB — MICROSCOPIC EXAMINATION

## 2015-07-12 LAB — URINALYSIS, COMPLETE
Bilirubin, UA: NEGATIVE
Glucose, UA: NEGATIVE
KETONES UA: NEGATIVE
NITRITE UA: NEGATIVE
PH UA: 5 (ref 5.0–7.5)
Protein, UA: NEGATIVE
Specific Gravity, UA: >1.03 — ABNORMAL HIGH (ref 1.005–1.030)
Urobilinogen, Ur: 0.2 mg/dL (ref 0.2–1.0)

## 2015-07-12 MED ORDER — DOXYCYCLINE HYCLATE 100 MG PO TABS
100.0000 mg | ORAL_TABLET | Freq: Two times a day (BID) | ORAL | Status: DC
Start: 2015-07-12 — End: 2015-08-12

## 2015-07-12 NOTE — Patient Outreach (Signed)
[place] [facility]) Care Management  07/12/2015  [name] 1930-03-10 [id number]   SUBJECTIVE:  Telephone call to patient regarding [employer] direct referral.  Patient states she is presently at the store and request call back on tomorrow.   PLAN; RNCM will attempt 2nd telephone call to patient within 3 business days.   Quinn Plowman RN,BSN,CCM [place] Coordinator [phone]

## 2015-07-12 NOTE — Progress Notes (Signed)
2:55 PM   [name] [birth year] [id number]  Referring provider: Bobetta Lime, MD [address] [place], [place] [address]  Chief Complaint  Patient presents with  . Interstitial Cystitis    Rescue solution    HPI: Patient is an 81 year old white female with a history of IC who presents today for rescue installation.  She states that she has had an increase in urinary frequency, urgency, dysuria, nocturia and incontinence over the last few days.  She has not had any fevers, chills, nausea or vomiting.  She has not had any gross hematuria, but she did have microscopic hematuria on her UA.  She also has extreme fatigue.     PMH: Past Medical History  Diagnosis Date  . GERD (gastroesophageal reflux disease)   . Vertigo   . Essential hypertension, benign   . Paroxysmal supraventricular tachycardia ([place])   . Pure hypercholesterolemia   . MS (multiple sclerosis) ([place])   . Fibromyalgia   . Diverticulosis   . Diabetes mellitus without complication ([place])   . Osteoarthritis   . Dyslipidemia   . SOB (shortness of breath)     SECONDARY TO REFLUX  . Chest pain     SECONDARY TO GERD  . Peripheral neuropathy (HCC)     MILD  . Cancer ([place])     breast  . Depression   . Chronic interstitial cystitis   . History of fractured rib   . Chronic right hip pain   . History of TIA (transient ischemic attack)   . Carpal tunnel syndrome   . Thyroid nodule   . Hearing loss of left ear   . Vitamin B 12 deficiency   . Protein malnutrition ([place])   . Frequent falls   . Anxiety and depression   . Irritable bowel   . Recurrent UTI     Surgical History: Past Surgical History  Procedure Laterality Date  . Total abdominal hysterectomy w/ bilateral salpingoophorectomy    . Mastectomy Right     DX MASTITIS/NO CANCER.Marland KitchenSALINE IMPLANT  . Cholecystectomy    . Hemorrhoid surgery      WITH RECONSTRUCTION  . Bladder tacking      X 3  . Eye surgeries      WITH BUCKLE  DETACHMENT OF THE RETINA  . Tonsillectomy    . Appendectomy    . Tympanoplasty    . Breast surgery    . Mastectomy    . Breast lumpectomy    . Colonoscopy with propofol N/A 01/21/2015    Procedure: COLONOSCOPY WITH PROPOFOL;  Surgeon: Josefine Class, MD;  Location: [facility] ENDOSCOPY;  Service: Endoscopy;  Laterality: N/A;  . Esophagogastroduodenoscopy N/A 01/21/2015    Procedure: ESOPHAGOGASTRODUODENOSCOPY (EGD);  Surgeon: Josefine Class, MD;  Location: [facility] ENDOSCOPY;  Service: Endoscopy;  Laterality: N/A;    Home Medications:    Medication List       This list is accurate as of: 07/12/15  2:55 PM.  Always use your most recent med list.               acetaminophen 500 MG tablet  Commonly known as:  TYLENOL  Take 500 mg by mouth every 6 (six) hours as needed.     acetic acid-aluminum acetate 2 % otic solution  Commonly known as:  DOMEBORO OTIC  every 3 (three) hours.     CRESTOR 5 MG tablet  Generic drug:  rosuvastatin  Take 2.5 mg by mouth.  [employer] VITAMIN B12 2000 MCG tablet  Generic drug:  cyanocobalamin  Take by mouth.     dicyclomine 10 MG capsule  Commonly known as:  BENTYL  TAKE 1 CAPSULE (10 MG TOTAL) BY MOUTH EVERY 8 (EIGHT) HOURS AS NEEDED.     doxycycline 100 MG tablet  Commonly known as:  VIBRA-TABS  Take 1 tablet (100 mg total) by mouth 2 (two) times daily.     ferrous fumarate 325 (106 FE) MG Tabs tablet  Commonly known as:  HEMOCYTE - 106 mg FE  Take 1 tablet by mouth.     ferrous sulfate 325 (65 FE) MG tablet  Take by mouth.     glucose blood test strip  1 each by Other route as needed for other. Use as instructed     meclizine 25 MG tablet  Commonly known as:  ANTIVERT  TAKE ONE TO TWO TABLETS BY MOUTH EVERY 8 HOURS AS NEEDED     metFORMIN 500 MG tablet  Commonly known as:  GLUCOPHAGE  Take by mouth.     omeprazole 20 MG capsule  Commonly known as:  PRILOSEC  Take 20 mg by mouth daily.     oxyCODONE-acetaminophen 7.5-325 MG  tablet  Commonly known as:  PERCOCET  Take 1 tablet by mouth every 4 (four) hours as needed for severe pain.     PROBIOTIC COLON SUPPORT Caps  Take by mouth.     sertraline 50 MG tablet  Commonly known as:  ZOLOFT  TAKE ONE TABLET BY MOUTH ONE TIME DAILY     URIBEL 118 MG Caps  One tablet four times daily as needed with 8 oz of water     VITAMIN D-1000 MAX ST 1000 UNITS tablet  Generic drug:  Cholecalciferol  Take by mouth.        Allergies:  Allergies  Allergen Reactions  . Biaxin [Clarithromycin] Other (See Comments) and Diarrhea  . Copaxone [Glatiramer Acetate]   . Decadron [Dexamethasone] Nausea And Vomiting and Other (See Comments)    Other reaction(s): Muscle Pain Reaction:  Abdominal pain  . Diltiazem Hcl Other (See Comments)    Reaction:  Unknown   . Diltiazem Hcl Other (See Comments)  . Flagyl [Metronidazole] Nausea Only and Diarrhea    Other reaction(s): Vomiting  . Gabapentin Other (See Comments)    "burning all over" Reaction:  Unknown   . Iohexol Swelling and Other (See Comments)     Desc: tongue swelling with "IVP dye" sccording to nurses notes with a lumbar myelo-12/09- asm, Onset Date: [id number]  Pts tongue swells.  Clementeen Hoof [Iodinated Diagnostic Agents] Swelling and Other (See Comments)    Passed out  Pts tongue swells.   . Levothyroxine Nausea Only  . Sulfonamide Derivatives Other (See Comments)    Reaction:  Unknown   . Synthroid [Levothyroxine Sodium]   . Copaxone  [Glatiramer] Rash  . Interferon Beta-1a Other (See Comments) and Rash    Reaction:  Unknown     Family History: Family History  Problem Relation Age of Onset  . Arthritis Sister   . Aneurysm Brother   . Heart disease Daughter   . ALS Mother   . Colon cancer    . Kidney disease Neg Hx     Social History:  reports that she has never smoked. She has never used smokeless tobacco. She reports that she does not drink alcohol or use illicit drugs.  ROS: UROLOGY Frequent  Urination?: Yes Hard to postpone urination?: Yes  Burning/pain with urination?: Yes Get up at night to urinate?: Yes Leakage of urine?: Yes Urine stream starts and stops?: No Trouble starting stream?: No Do you have to strain to urinate?: No Blood in urine?: No Urinary tract infection?: No Sexually transmitted disease?: No Injury to kidneys or bladder?: No Painful intercourse?: No Weak stream?: No Currently pregnant?: No Vaginal bleeding?: No Last menstrual period?: n  Gastrointestinal Nausea?: No Vomiting?: No Indigestion/heartburn?: No Diarrhea?: No Constipation?: No  Constitutional Fever: No Night sweats?: No Weight loss?: No Fatigue?: No  Skin Skin rash/lesions?: No Itching?: No  Eyes Blurred vision?: Yes Double vision?: No  Ears/Nose/Throat Sore throat?: Yes Sinus problems?: Yes  Hematologic/Lymphatic Swollen glands?: No Easy bruising?: No  Cardiovascular Leg swelling?: Yes Chest pain?: No  Respiratory Cough?: No Shortness of breath?: No  Endocrine Excessive thirst?: No  Musculoskeletal Back pain?: Yes Joint pain?: Yes  Neurological Headaches?: Yes Dizziness?: Yes  Psychologic Depression?: Yes Anxiety?: Yes  Physical Exam: Blood pressure 165/85, pulse 76, height 5' (1.524 m), weight 155 lb 9.6 oz (70.58 kg). Constitutional: Well nourished. Alert and oriented, No acute distress. HEENT: [place] AT, moist mucus membranes. Trachea midline, no masses. Cardiovascular: No clubbing, cyanosis, or edema. Respiratory: Normal respiratory effort, no increased work of breathing. Skin: No rashes, bruises or suspicious lesions. Lymph: No cervical or inguinal adenopathy. Neurologic: Grossly intact, no focal deficits, moving all 4 extremities. Psychiatric: Normal mood and affect.   Laboratory Data: Lab Results  Component Value Date   WBC 4.7 04/18/2015   HGB 9.7* 01/06/2015   HCT 39.9 04/18/2015   MCV 75.6* 01/06/2015   PLT 207 01/06/2015    Lab Results  Component Value Date   CREATININE 1.03* 04/18/2015   Lab Results  Component Value Date   HGBA1C 6.5 04/15/2015   Urinalysis: Results for orders placed or performed in visit on 07/12/15  CULTURE, URINE COMPREHENSIVE  Result Value Ref Range   Result 1 Comment   Microscopic Examination  Result Value Ref Range   WBC, UA 11-30 (A) 0 -  5 /hpf   RBC, UA 3-10 (A) 0 -  2 /hpf   Epithelial Cells (non renal) >10 (H) 0 - 10 /hpf   Mucus, UA Present (A) Not Estab.   Bacteria, UA Many (A) None seen/Few  Urinalysis, Complete  Result Value Ref Range   Specific Gravity, UA >1.030 (H) 1.005 - 1.030   pH, UA 5.0 5.0 - 7.5   Color, UA Yellow Yellow   Appearance Ur Clear Clear   Leukocytes, UA 1+ (A) Negative   Protein, UA Negative Negative/Trace   Glucose, UA Negative Negative   Ketones, UA Negative Negative   RBC, UA Trace (A) Negative   Bilirubin, UA Negative Negative   Urobilinogen, Ur 0.2 0.2 - 1.0 mg/dL   Nitrite, UA Negative Negative   Microscopic Examination See below:     Assessment & Plan:    1. Interstitial cystitis:   Patient's was having symptoms of an UTI and her UA was suspicious for infection.  She did not receive her rescue solution today.  - Urinalysis, Complete  2. UTI:   I have empirically prescribed doxycycline for the patient.  I will adjust if necessary when the culture results are available.    3. Microscopic hematuria:   Patient continues to have intermittent microscopic hematuria.  She did undergo a cystoscopy in 05/2014 and no GU pathology was found.  She is highly allergic to IVP dye, so I will obtain a RUS for  upper imaging study.     Return in about 1 week (around 07/19/2015) for RUS report.  Zara Council, [facility] [address] [place], [place] [address] [phone]

## 2015-07-13 ENCOUNTER — Other Ambulatory Visit: Payer: Commercial Managed Care - HMO

## 2015-07-13 NOTE — Patient Outreach (Addendum)
[place] [facility]) Care Management  07/13/2015  [name] August 31, 1929 [id number]  SUBJECTIVE:  Telephone call to patient regarding [facility] Direct referral.  HIPAA verified with patient.  Discussed and offered [facility] care management services. Patient verbally agreed to receive services.   THERAPY: Patient states she is presently receiving outpatient therapy every weak due to pain in her spine and hips. Patient states she is going to [facility] [employer] rehabilitation.  Patient states her gastroenterologist, Dr. Auburn Bilberry referred her for therapy.  Patient states she has multiple sclerosis.  Patient states she is also dizzy all of the time. Patient reports she has had multiple falls within the pass year and has sustained multiple head injuries.  Patient states she was in the emergency department approximately 3 months ago due to fall and hitting her head. Patient reports she has been in the emergency room multiple times within the past year.   Patient states she does walk with a walker. Patient states walking has become increasingly difficult for her due to neuropathy in both feet and legs and having a lump on the top of her left foot.  Patient states her doctors are aware of the multiple falls and lump on her foot.  Patient states she sees a podiatrist. Patient states sometimes she is so dizzy that she is unable to walk.  Patient states the doctors are unsure why she gets dizzy. Patient states she has been experiencing this for years but she has noticed that it has gotten worse.   UROLOGY:  Patient states she sees a urologist, Dr. Ernestine [place] and receives treatment every week for interstitial cystitis.  Patient states she is unable to receive treatment this week because she is currently being treated for a bladder infection.   CANCER: Patient states she has been previously treated with radiation for breast cancer.  Patient states after her radiation treatment her left nipple dried up and fell off.  Patient states  she has discussed this with her oncologist who states the nipple is inverted.  Patient states she is status post mastectomy of her right breast and status post lumpectomy of her left breast. Patient states both sides hurt her.  Patient states her doctor has checked both sides of her breast areas.    DIABETES; Patient states she is on oral medication metformin.  Patient states she was checking her blood sugars daily until 1 week ago.  Patient states her glucometer stopped working approximately 1 week ago. Patient states her last reading for her blood sugar approximately 1 week ago was 136.  Patient states she has neuropathy in her feet and legs.  Patient states she has a lump on her left foot that swells from time to time. Patient states her doctor is aware of this.   CARDIAC: Patient states she has a implantable device in her chest that use to monitor her heart.  Patient states she received this some years ago when she was living in [place]. Patient states it has never been removed.  Patient states she use to hold a piece of equipment over it periodically to check it and this information would be sent to her cardiologist when she was living in [place].  Patient states she does not have a cardiologist currently. Patient states her doctor is aware of the device in her chest.   PHARMACY: Patient states she is on approximately 10 medications.  Patient states she takes 5-6 prescribed medications and the rest are over the counter.   SOCIAL SUPPORT: patient states she  lives with her husband and daughter.  Patient states her husband and daughter have multiple medical conditions.  Patient states her husband is unable to walk well.  Patient state her daughter has medical conditions and is not able to help her as much.   ASSESSMENT; [employer] direct referral.  Patient with multiple medical conditions and fall risk.  Patient will benefit from community case manager referral for home safety evaluation, care coordination  and education to disease processes. Patient will benefit from referral to pharmacy for polypharmacy.  PLAN: RNCM will refer patient to community case manager and pharmacy.  Quinn Plowman RN,BSN,CCM [place] Coordinator [phone]

## 2015-07-14 ENCOUNTER — Ambulatory Visit: Payer: Commercial Managed Care - HMO | Admitting: Physical Therapy

## 2015-07-14 DIAGNOSIS — M545 Low back pain, unspecified: Secondary | ICD-10-CM

## 2015-07-14 DIAGNOSIS — R52 Pain, unspecified: Secondary | ICD-10-CM | POA: Diagnosis not present

## 2015-07-14 DIAGNOSIS — R296 Repeated falls: Secondary | ICD-10-CM

## 2015-07-14 LAB — CULTURE, URINE COMPREHENSIVE

## 2015-07-14 NOTE — Therapy (Signed)
[facility] [address] [place], [address] Phone: [phone]   Fax:  [phone]  Physical Therapy Treatment  Patient Details  Name: [name] MRN: [id number] Date of Birth: 02/20/1930 Referring Provider: Arther Dames  Encounter Date: 07/14/2015      PT End of Session - 07/14/15 1156    Visit Number 3   Number of Visits 13   Date for PT Re-Evaluation 08/18/15   Authorization Type 3   Authorization Time Period 10   PT Start Time 1015   PT Stop Time 1055   PT Time Calculation (min) 40 min   Activity Tolerance Patient tolerated treatment well;No increased pain;Patient limited by fatigue;Patient limited by pain   Behavior During Therapy [facility] for tasks assessed/performed      Past Medical History  Diagnosis Date  . GERD (gastroesophageal reflux disease)   . Vertigo   . Essential hypertension, benign   . Paroxysmal supraventricular tachycardia ([place])   . Pure hypercholesterolemia   . MS (multiple sclerosis) ([place])   . Fibromyalgia   . Diverticulosis   . Diabetes mellitus without complication ([place])   . Osteoarthritis   . Dyslipidemia   . SOB (shortness of breath)     SECONDARY TO REFLUX  . Chest pain     SECONDARY TO GERD  . Peripheral neuropathy (HCC)     MILD  . Cancer ([place])     breast  . Depression   . Chronic interstitial cystitis   . History of fractured rib   . Chronic right hip pain   . History of TIA (transient ischemic attack)   . Carpal tunnel syndrome   . Thyroid nodule   . Hearing loss of left ear   . Vitamin B 12 deficiency   . Protein malnutrition ([place])   . Frequent falls   . Anxiety and depression   . Irritable bowel   . Recurrent UTI     Past Surgical History  Procedure Laterality Date  . Total abdominal hysterectomy w/ bilateral salpingoophorectomy    . Mastectomy Right     DX MASTITIS/NO CANCER.Marland KitchenSALINE IMPLANT  . Cholecystectomy    . Hemorrhoid surgery     WITH RECONSTRUCTION  . Bladder tacking      X 3  . Eye surgeries      WITH BUCKLE DETACHMENT OF THE RETINA  . Tonsillectomy    . Appendectomy    . Tympanoplasty    . Breast surgery    . Mastectomy    . Breast lumpectomy    . Colonoscopy with propofol N/A 01/21/2015    Procedure: COLONOSCOPY WITH PROPOFOL;  Surgeon: Josefine Class, MD;  Location: [facility] ENDOSCOPY;  Service: Endoscopy;  Laterality: N/A;  . Esophagogastroduodenoscopy N/A 01/21/2015    Procedure: ESOPHAGOGASTRODUODENOSCOPY (EGD);  Surgeon: Josefine Class, MD;  Location: [facility] ENDOSCOPY;  Service: Endoscopy;  Laterality: N/A;    There were no vitals filed for this visit.  Visit Diagnosis:  Bilateral low back pain without sciatica  Falls frequently  Pain aggravated by walking      Subjective Assessment - 07/14/15 1150    Subjective Patient reports dizziness continued. No falls since last session.    Patient is accompained by: Family member   Limitations Walking;House hold activities   Patient Stated Goals "to not fall as much, to decrease pain, to be less dizzy."   Currently in Pain? Yes   Pain Score 6    Pain Location --  all over   Pain Type Chronic pain   Pain Onset More than a month ago   Pain Frequency Constant   Multiple Pain Sites Yes        Treatment to include  7 min nustep (level 3 for 5 min 2 min at level 1) for warm up/mobility and pain relief. Standing hip abduction, marching, heel raises 2x10 for strengthening. Side stepping on foam beam x 10 laps with UE assist for balance as needed.  Sit to stand x 10 reps with hands, step ups on 2 " step x 10 bilaterally. Seated LAQ, hip adduction, hip abduction with green band x 15 reps. Cues and UE assist as needed for correct form and safety.   Neuromuscular re-education to include: narrow stance on foam, normal stance with head turning, normal stance with eyes closed X 1 min each with close supervision, cga for safety as needed. Pt also had UE  assist as needed for balance.          PT Education - 07/14/15 1155    Education provided Yes   Education Details exercises, balance activities, being evaluated by vestibular therapist.    Person(s) Educated Patient   Methods Explanation;Demonstration   Comprehension Verbalized understanding;Returned demonstration             PT Long Term Goals - 07/07/15 [id number]    PT LONG TERM GOAL #1   Title Patient will demonstrate imrpoved functional mobility by improved LEFS of > 40/80   Baseline 25/80   Time 6   Period Weeks   Status New   PT LONG TERM GOAL #2   Title Patient will demonstrate improved safety by improving gait speed to 10 meters in < 20 sec.    Baseline 25 sec   Time 6   Period Weeks   Status New   PT LONG TERM GOAL #3   Title Patient will demonstrate improved mobility by performing 5x STS in < 30 sec.    Baseline 44 sec   Time 6   Period Weeks   Status New               Plan - 07/14/15 1157    Clinical Impression Statement Patient continues to report all over pain, continues to suffer from dizziness. Attempting to get patient into vestibular therapist for screening.    Pt will benefit from skilled therapeutic intervention in order to improve on the following deficits Abnormal gait;Decreased activity tolerance;Decreased balance;Decreased strength;Decreased mobility;Difficulty walking;Dizziness;Pain;Decreased safety awareness;Decreased endurance;Decreased range of motion   Rehab Potential Fair   PT Frequency 2x / week   PT Duration 6 weeks   PT Treatment/Interventions Moist Heat;Therapeutic exercise;Therapeutic activities;Gait training;Neuromuscular re-education;Balance training;Aquatic Therapy;Patient/family education;Manual techniques   Consulted and Agree with Plan of Care Patient        Problem List Patient Active Problem List   Diagnosis Date Noted  . Microscopic hematuria 07/12/2015  . Suprapubic pain, acute 05/08/2015  . Diarrhea 05/02/2015   . Urinary tract infectious disease 05/02/2015  . Interstitial cystitis 04/25/2015  . Boil, face 04/22/2015  . Need for immunization against influenza 04/15/2015  . Recurrent UTI 04/09/2015  . Dysuria 04/01/2015  . Anxiety and depression 01/12/2014  . Carpal tunnel syndrome 01/12/2014  . DD (diverticular disease) 01/12/2014  . Carcinoma in situ, breast, ductal 01/12/2014  . H/O surgical procedure 01/12/2014  . H/O right mastectomy 01/12/2014  . Hypercholesteremia 01/12/2014  . DS (disseminated sclerosis) ([place]) 01/12/2014  . Arthritis, degenerative 01/12/2014  . Thyroid  nodule 01/12/2014  . Cyanocobalamine deficiency (non anemic) 01/12/2014  . DM (diabetes mellitus) type II controlled, neurological manifestation ([place]) 06/26/2010  . Depression, major, recurrent, in partial remission ([place]) 06/26/2010  . Frequent falls 06/26/2010  . Stricture and stenosis of esophagus 06/26/2010  . Gastro-esophageal reflux disease without esophagitis 06/26/2010  . Fibromyalgia 06/26/2010  . Chronic interstitial cystitis 06/26/2010  . Acid reflux 06/26/2010  . H/O malignant neoplasm of breast 06/26/2010  . Hypertension goal BP (blood pressure) < 150/90 06/01/2009  . H/O paroxysmal supraventricular tachycardia 06/01/2009  . Paroxysmal supraventricular tachycardia ([place]) 06/01/2009  . Irritable bowel syndrome with constipation and diarrhea 06/16/2008  . Diarrhea, functional 06/16/2008  . D (diarrhea) 06/16/2008    Milka Windholz, PT, MPT, GCS 07/14/2015, 11:59 AM  [facility] [address] [place], [address] Phone: [phone]   Fax:  [phone]  Name: [name] MRN: [id number] Date of Birth: [birth year]

## 2015-07-15 ENCOUNTER — Telehealth: Payer: Self-pay

## 2015-07-15 NOTE — Telephone Encounter (Signed)
-----   Message from Nori Riis, PA-C sent at 07/15/2015  8:40 AM EST ----- Patient's urine culture is negative.  If she is still feeling fatigued, she needs to see her PCP.

## 2015-07-15 NOTE — Telephone Encounter (Signed)
Spoke with pt in reference to -ucx. Pt voiced understanding.  

## 2015-07-18 ENCOUNTER — Ambulatory Visit: Payer: Commercial Managed Care - HMO | Admitting: Physical Therapy

## 2015-07-18 DIAGNOSIS — M545 Low back pain, unspecified: Secondary | ICD-10-CM

## 2015-07-18 DIAGNOSIS — R52 Pain, unspecified: Secondary | ICD-10-CM

## 2015-07-18 DIAGNOSIS — R296 Repeated falls: Secondary | ICD-10-CM

## 2015-07-18 NOTE — Therapy (Signed)
[facility] [address] [place], [address] Phone: [phone]   Fax:  [phone]  Physical Therapy Treatment  Patient Details  Name: [name] MRN: [id number] Date of Birth: 01-14-1930 Referring Provider: Arther Dames  Encounter Date: 07/18/2015      PT End of Session - 07/18/15 1617    Visit Number 4   Number of Visits 13   Date for PT Re-Evaluation 08/18/15   Authorization Type 4   Authorization Time Period 10   PT Start Time 1340   PT Stop Time 1419   PT Time Calculation (min) 39 min   Activity Tolerance Patient tolerated treatment well;Patient limited by pain   Behavior During Therapy [facility] for tasks assessed/performed      Past Medical History  Diagnosis Date  . GERD (gastroesophageal reflux disease)   . Vertigo   . Essential hypertension, benign   . Paroxysmal supraventricular tachycardia ([place])   . Pure hypercholesterolemia   . MS (multiple sclerosis) ([place])   . Fibromyalgia   . Diverticulosis   . Diabetes mellitus without complication ([place])   . Osteoarthritis   . Dyslipidemia   . SOB (shortness of breath)     SECONDARY TO REFLUX  . Chest pain     SECONDARY TO GERD  . Peripheral neuropathy (HCC)     MILD  . Cancer ([place])     breast  . Depression   . Chronic interstitial cystitis   . History of fractured rib   . Chronic right hip pain   . History of TIA (transient ischemic attack)   . Carpal tunnel syndrome   . Thyroid nodule   . Hearing loss of left ear   . Vitamin B 12 deficiency   . Protein malnutrition ([place])   . Frequent falls   . Anxiety and depression   . Irritable bowel   . Recurrent UTI     Past Surgical History  Procedure Laterality Date  . Total abdominal hysterectomy w/ bilateral salpingoophorectomy    . Mastectomy Right     DX MASTITIS/NO CANCER.Marland KitchenSALINE IMPLANT  . Cholecystectomy    . Hemorrhoid surgery      WITH RECONSTRUCTION  . Bladder tacking       X 3  . Eye surgeries      WITH BUCKLE DETACHMENT OF THE RETINA  . Tonsillectomy    . Appendectomy    . Tympanoplasty    . Breast surgery    . Mastectomy    . Breast lumpectomy    . Colonoscopy with propofol N/A 01/21/2015    Procedure: COLONOSCOPY WITH PROPOFOL;  Surgeon: Josefine Class, MD;  Location: [facility] ENDOSCOPY;  Service: Endoscopy;  Laterality: N/A;  . Esophagogastroduodenoscopy N/A 01/21/2015    Procedure: ESOPHAGOGASTRODUODENOSCOPY (EGD);  Surgeon: Josefine Class, MD;  Location: [facility] ENDOSCOPY;  Service: Endoscopy;  Laterality: N/A;    There were no vitals filed for this visit.  Visit Diagnosis:  Bilateral low back pain without sciatica  Falls frequently  Pain aggravated by walking      Subjective Assessment - 07/18/15 1615    Subjective Patient reports continued dizziness encouraged pt to maybe go back to ENT for referral for vestibular PT.  Said she woke up with pain all over this morning.    Patient is accompained by: Family member   Limitations Walking;House hold activities   Patient Stated Goals "to not fall as much, to decrease pain, to be less dizzy."  Currently in Pain? Yes   Pain Location --  all over   Pain Descriptors / Indicators Aching   Pain Type Chronic pain   Pain Onset More than a month ago   Pain Frequency Constant   Multiple Pain Sites Yes         Treatment to include  7 min nustep (llevel 2) for warm up/mobility and pain relief. Standing hip abduction, marching, heel raises 2x10 for strengthening. Side stepping on foam beam x 10 laps with UE assist for balance as needed.  Sit to stand x 10 reps with hands, step ups on 2 " step x 10 bilaterally. Seated LAQ, hip adduction, hip abduction with green band x 15 reps. Cues and UE assist as needed for correct form and safety.  Manual therapy to low back in prone position. Pt has a lot of tenderness with this. Pt has limited lumbar extension and hypomobility of lumbar and thoracic  spine.           PT Education - 07/18/15 1617    Education provided Yes   Education Details exercises, importance of manual therapy and movement for pain reduction   Person(s) Educated Patient   Methods Explanation;Demonstration   Comprehension Verbalized understanding;Returned demonstration             PT Long Term Goals - 07/07/15 [id number]    PT LONG TERM GOAL #1   Title Patient will demonstrate imrpoved functional mobility by improved LEFS of > 40/80   Baseline 25/80   Time 6   Period Weeks   Status New   PT LONG TERM GOAL #2   Title Patient will demonstrate improved safety by improving gait speed to 10 meters in < 20 sec.    Baseline 25 sec   Time 6   Period Weeks   Status New   PT LONG TERM GOAL #3   Title Patient will demonstrate improved mobility by performing 5x STS in < 30 sec.    Baseline 44 sec   Time 6   Period Weeks   Status New               Plan - 07/18/15 1618    Clinical Impression Statement Patient will benefit from continued manual therapy and prone positioning for back stiffness and to improve mobility, decrease pain.    Pt will benefit from skilled therapeutic intervention in order to improve on the following deficits Abnormal gait;Decreased activity tolerance;Decreased balance;Decreased strength;Decreased mobility;Difficulty walking;Dizziness;Pain;Decreased safety awareness;Decreased endurance;Decreased range of motion   Rehab Potential Fair   PT Frequency 2x / week   PT Duration 6 weeks   PT Treatment/Interventions Moist Heat;Therapeutic exercise;Therapeutic activities;Gait training;Neuromuscular re-education;Balance training;Aquatic Therapy;Patient/family education;Manual techniques   Consulted and Agree with Plan of Care Patient        Problem List Patient Active Problem List   Diagnosis Date Noted  . Microscopic hematuria 07/12/2015  . Suprapubic pain, acute 05/08/2015  . Diarrhea 05/02/2015  . Urinary tract infectious  disease 05/02/2015  . Interstitial cystitis 04/25/2015  . Boil, face 04/22/2015  . Need for immunization against influenza 04/15/2015  . Recurrent UTI 04/09/2015  . Dysuria 04/01/2015  . Anxiety and depression 01/12/2014  . Carpal tunnel syndrome 01/12/2014  . DD (diverticular disease) 01/12/2014  . Carcinoma in situ, breast, ductal 01/12/2014  . H/O surgical procedure 01/12/2014  . H/O right mastectomy 01/12/2014  . Hypercholesteremia 01/12/2014  . DS (disseminated sclerosis) ([place]) 01/12/2014  . Arthritis, degenerative 01/12/2014  . Thyroid nodule 01/12/2014  .  Cyanocobalamine deficiency (non anemic) 01/12/2014  . DM (diabetes mellitus) type II controlled, neurological manifestation ([place]) 06/26/2010  . Depression, major, recurrent, in partial remission ([place]) 06/26/2010  . Frequent falls 06/26/2010  . Stricture and stenosis of esophagus 06/26/2010  . Gastro-esophageal reflux disease without esophagitis 06/26/2010  . Fibromyalgia 06/26/2010  . Chronic interstitial cystitis 06/26/2010  . Acid reflux 06/26/2010  . H/O malignant neoplasm of breast 06/26/2010  . Hypertension goal BP (blood pressure) < 150/90 06/01/2009  . H/O paroxysmal supraventricular tachycardia 06/01/2009  . Paroxysmal supraventricular tachycardia ([place]) 06/01/2009  . Irritable bowel syndrome with constipation and diarrhea 06/16/2008  . Diarrhea, functional 06/16/2008  . D (diarrhea) 06/16/2008    Dimond Crotty, PT, MPT, GCS 07/18/2015, 4:22 PM  [facility] [address] [place], [address] Phone: [phone]   Fax:  [phone]  Name: [name] MRN: [id number] Date of Birth: Apr 19, 1930

## 2015-07-19 ENCOUNTER — Ambulatory Visit
Admission: RE | Admit: 2015-07-19 | Discharge: 2015-07-19 | Disposition: A | Discharge status: Home / Self Care | Payer: Commercial Managed Care - HMO | Source: Ambulatory Visit | Attending: Urology | Admitting: Urology

## 2015-07-19 DIAGNOSIS — E1169 Type 2 diabetes mellitus with other specified complication: Secondary | ICD-10-CM | POA: Diagnosis not present

## 2015-07-19 DIAGNOSIS — I340 Nonrheumatic mitral (valve) insufficiency: Secondary | ICD-10-CM | POA: Diagnosis not present

## 2015-07-19 DIAGNOSIS — N281 Cyst of kidney, acquired: Secondary | ICD-10-CM | POA: Insufficient documentation

## 2015-07-19 DIAGNOSIS — R030 Elevated blood-pressure reading, without diagnosis of hypertension: Secondary | ICD-10-CM | POA: Diagnosis not present

## 2015-07-19 DIAGNOSIS — I5032 Chronic diastolic (congestive) heart failure: Secondary | ICD-10-CM | POA: Diagnosis not present

## 2015-07-19 DIAGNOSIS — R3129 Other microscopic hematuria: Secondary | ICD-10-CM | POA: Insufficient documentation

## 2015-07-19 DIAGNOSIS — I351 Nonrheumatic aortic (valve) insufficiency: Secondary | ICD-10-CM | POA: Diagnosis not present

## 2015-07-19 DIAGNOSIS — K219 Gastro-esophageal reflux disease without esophagitis: Secondary | ICD-10-CM | POA: Diagnosis not present

## 2015-07-19 DIAGNOSIS — E782 Mixed hyperlipidemia: Secondary | ICD-10-CM | POA: Diagnosis not present

## 2015-07-19 DIAGNOSIS — Z0000 Encounter for general adult medical examination without abnormal findings: Secondary | ICD-10-CM | POA: Diagnosis not present

## 2015-07-20 ENCOUNTER — Other Ambulatory Visit: Payer: Self-pay | Admitting: *Deleted

## 2015-07-20 ENCOUNTER — Encounter: Payer: Self-pay | Admitting: Urology

## 2015-07-20 ENCOUNTER — Ambulatory Visit (INDEPENDENT_AMBULATORY_CARE_PROVIDER_SITE_OTHER): Payer: Commercial Managed Care - HMO | Admitting: Urology

## 2015-07-20 VITALS — BP 155/77 | HR 77 | Ht 61.0 in | Wt 155.3 lb

## 2015-07-20 DIAGNOSIS — R3129 Other microscopic hematuria: Secondary | ICD-10-CM | POA: Diagnosis not present

## 2015-07-20 DIAGNOSIS — N3010 Interstitial cystitis (chronic) without hematuria: Secondary | ICD-10-CM | POA: Diagnosis not present

## 2015-07-20 LAB — MICROSCOPIC EXAMINATION

## 2015-07-20 LAB — URINALYSIS, COMPLETE
Bilirubin, UA: NEGATIVE
GLUCOSE, UA: NEGATIVE
KETONES UA: NEGATIVE
LEUKOCYTES UA: NEGATIVE
Nitrite, UA: NEGATIVE
RBC, UA: NEGATIVE
Urobilinogen, Ur: 0.2 mg/dL (ref 0.2–1.0)
pH, UA: 5.5 (ref 5.0–7.5)

## 2015-07-20 MED ORDER — LIDOCAINE HCL 2 % EX GEL
1.0000 "application " | Freq: Once | CUTANEOUS | Status: AC
  Administered 2015-07-20: 1 via URETHRAL

## 2015-07-20 MED ORDER — SODIUM BICARBONATE 8.4 % IV SOLN
11.0000 mL | Freq: Once | INTRAVENOUS | Status: AC
  Administered 2015-07-20: 11 mL

## 2015-07-20 NOTE — Patient Outreach (Signed)
Unsuccessful outreach for [facility] screening. HIPAA compliant message left.   Plan: RNCM will await return call  RNCM will attempt outreach again in 2 days.   Abir Eroh RN, BSN  [facility] Care Management ([phone]) 

## 2015-07-20 NOTE — Progress Notes (Signed)
Bladder Rescue Solution Instillation  Due to interstitial cystitis patient is present today for a Rescue Solution Treatment.  Patient was cleaned and prepped in a sterile fashion with betadine and lidocaine 2% jelly was instilled into the urethra.  A 14FR catheter was inserted, urine return was noted 47ml, urine was clear and yellow in color.  Instilled a solution consisting of 37ml of Sodium Bicarb, 2 ml Lidocaine and 1 ml of Heparin. The catheter was then removed. Patient tolerated well, no complications were noted.   Performed by: Lyndee Hensen CMA  Follow up/ Additional Notes: One week

## 2015-07-20 NOTE — Progress Notes (Signed)
10:59 AM   [name] 1930/04/16 [id number]  Referring provider: Bobetta Lime, MD [address] [place], [place] [address]  Chief Complaint  Patient presents with  . Results    Renal US    HPI:  Patient is an 81 year old white female with a history of IC who presents today for rescue installation and her RUS report.    Today, she is still experiencing the bilateral lower abdominal pain. She has not had any fevers, chills, nausea or vomiting.  She has not had any gross hematuria and she does not have microscopic hematuria on today's UA.  Her urine culture from 07/12/2015 was negative.    Her RUS demonstrated simple appearing cysts in the left kidney.  She does have a history of a radiodense structure in the midpole of the right kidney that was not visible on the ultrasound.  I have reviewed the images with the patient.     PMH: Past Medical History  Diagnosis Date  . GERD (gastroesophageal reflux disease)   . Vertigo   . Essential hypertension, benign   . Paroxysmal supraventricular tachycardia ([place])   . Pure hypercholesterolemia   . MS (multiple sclerosis) ([place])   . Fibromyalgia   . Diverticulosis   . Diabetes mellitus without complication ([place])   . Osteoarthritis   . Dyslipidemia   . SOB (shortness of breath)     SECONDARY TO REFLUX  . Chest pain     SECONDARY TO GERD  . Peripheral neuropathy (HCC)     MILD  . Cancer ([place])     breast  . Depression   . Chronic interstitial cystitis   . History of fractured rib   . Chronic right hip pain   . History of TIA (transient ischemic attack)   . Carpal tunnel syndrome   . Thyroid nodule   . Hearing loss of left ear   . Vitamin B 12 deficiency   . Protein malnutrition ([place])   . Frequent falls   . Anxiety and depression   . Irritable bowel   . Recurrent UTI     Surgical History: Past Surgical History  Procedure Laterality Date  . Total abdominal hysterectomy w/ bilateral salpingoophorectomy     . Mastectomy Right     DX MASTITIS/NO CANCER.Marland KitchenSALINE IMPLANT  . Cholecystectomy    . Hemorrhoid surgery      WITH RECONSTRUCTION  . Bladder tacking      X 3  . Eye surgeries      WITH BUCKLE DETACHMENT OF THE RETINA  . Tonsillectomy    . Appendectomy    . Tympanoplasty    . Breast surgery    . Mastectomy    . Breast lumpectomy    . Colonoscopy with propofol N/A 01/21/2015    Procedure: COLONOSCOPY WITH PROPOFOL;  Surgeon: Josefine Class, MD;  Location: [facility] ENDOSCOPY;  Service: Endoscopy;  Laterality: N/A;  . Esophagogastroduodenoscopy N/A 01/21/2015    Procedure: ESOPHAGOGASTRODUODENOSCOPY (EGD);  Surgeon: Josefine Class, MD;  Location: [facility] ENDOSCOPY;  Service: Endoscopy;  Laterality: N/A;    Home Medications:    Medication List       This list is accurate as of: 07/20/15 10:59 AM.  Always use your most recent med list.               acetaminophen 500 MG tablet  Commonly known as:  TYLENOL  Take 500 mg by mouth every 6 (six) hours as needed.  acetic acid-aluminum acetate 2 % otic solution  Commonly known as:  DOMEBORO OTIC  every 3 (three) hours.     CRESTOR 5 MG tablet  Generic drug:  rosuvastatin  Take 2.5 mg by mouth.     [employer] VITAMIN B12 2000 MCG tablet  Generic drug:  cyanocobalamin  Take by mouth.     dicyclomine 10 MG capsule  Commonly known as:  BENTYL  TAKE 1 CAPSULE (10 MG TOTAL) BY MOUTH EVERY 8 (EIGHT) HOURS AS NEEDED.     doxycycline 100 MG tablet  Commonly known as:  VIBRA-TABS  Take 1 tablet (100 mg total) by mouth 2 (two) times daily.     ferrous fumarate 325 (106 FE) MG Tabs tablet  Commonly known as:  HEMOCYTE - 106 mg FE  Take 1 tablet by mouth. Reported on 07/20/2015     ferrous sulfate 325 (65 FE) MG tablet  Take by mouth.     glucose blood test strip  1 each by Other route as needed for other. Use as instructed     meclizine 25 MG tablet  Commonly known as:  ANTIVERT  TAKE ONE TO TWO TABLETS BY MOUTH EVERY 8  HOURS AS NEEDED     metFORMIN 500 MG tablet  Commonly known as:  GLUCOPHAGE  Take by mouth.     omeprazole 20 MG capsule  Commonly known as:  PRILOSEC  Take 20 mg by mouth daily.     oxyCODONE-acetaminophen 7.5-325 MG tablet  Commonly known as:  PERCOCET  Take 1 tablet by mouth every 4 (four) hours as needed for severe pain.     PROBIOTIC COLON SUPPORT Caps  Take by mouth.     sertraline 50 MG tablet  Commonly known as:  ZOLOFT  TAKE ONE TABLET BY MOUTH ONE TIME DAILY     URIBEL 118 MG Caps  One tablet four times daily as needed with 8 oz of water     VITAMIN D-1000 MAX ST 1000 UNITS tablet  Generic drug:  Cholecalciferol  Take by mouth.        Allergies:  Allergies  Allergen Reactions  . Biaxin [Clarithromycin] Other (See Comments) and Diarrhea  . Copaxone [Glatiramer Acetate]   . Decadron [Dexamethasone] Nausea And Vomiting and Other (See Comments)    Other reaction(s): Muscle Pain Reaction:  Abdominal pain  . Dexamethasone Sodium Phosphate Other (See Comments)  . Diltiazem Hcl Other (See Comments)    Reaction:  Unknown   . Diltiazem Hcl Other (See Comments)  . Flagyl [Metronidazole] Nausea Only and Diarrhea    Other reaction(s): Vomiting  . Gabapentin Other (See Comments)    "burning all over" Reaction:  Unknown   . Iohexol Swelling and Other (See Comments)     Desc: tongue swelling with "IVP dye" sccording to nurses notes with a lumbar myelo-12/09- asm, Onset Date: [id number]  Pts tongue swells.  Clementeen Hoof [Iodinated Diagnostic Agents] Swelling and Other (See Comments)    Passed out  Pts tongue swells.   . Levothyroxine Nausea Only  . Sulfonamide Derivatives Other (See Comments)    Reaction:  Unknown   . Synthroid [Levothyroxine Sodium]   . Copaxone  [Glatiramer] Rash  . Interferon Beta-1a Other (See Comments) and Rash    Reaction:  Unknown     Family History: Family History  Problem Relation Age of Onset  . Arthritis Sister   . Aneurysm Brother     . Heart disease Daughter   . ALS Mother   .  Colon cancer    . Kidney disease Neg Hx     Social History:  reports that she has never smoked. She has never used smokeless tobacco. She reports that she does not drink alcohol or use illicit drugs.  ROS: UROLOGY Frequent Urination?: Yes Hard to postpone urination?: No Burning/pain with urination?: Yes Get up at night to urinate?: Yes Leakage of urine?: No Urine stream starts and stops?: No Trouble starting stream?: No Do you have to strain to urinate?: No Blood in urine?: No Urinary tract infection?: No Sexually transmitted disease?: No Injury to kidneys or bladder?: No Painful intercourse?: No Weak stream?: No Currently pregnant?: No Vaginal bleeding?: No Last menstrual period?: n  Gastrointestinal Nausea?: No Vomiting?: No Indigestion/heartburn?: No Diarrhea?: No Constipation?: No  Constitutional Fever: No Night sweats?: Yes Weight loss?: No Fatigue?: Yes  Skin Skin rash/lesions?: No Itching?: No  Eyes Blurred vision?: Yes Double vision?: No  Ears/Nose/Throat Sore throat?: No Sinus problems?: No  Hematologic/Lymphatic Swollen glands?: No Easy bruising?: No  Cardiovascular Leg swelling?: Yes Chest pain?: No  Respiratory Cough?: No Shortness of breath?: No  Endocrine Excessive thirst?: No  Musculoskeletal Back pain?: Yes Joint pain?: Yes  Neurological Headaches?: Yes Dizziness?: Yes  Psychologic Depression?: Yes Anxiety?: Yes  Physical Exam: Blood pressure 155/77, pulse 77, height 5' 1"  (1.549 m), weight 155 lb 4.8 oz (70.444 kg). Constitutional: Well nourished. Alert and oriented, No acute distress. HEENT: [place] AT, moist mucus membranes. Trachea midline, no masses. Cardiovascular: No clubbing, cyanosis, or edema. Respiratory: Normal respiratory effort, no increased work of breathing. GI: Abdomen is soft, non tender, non distended, no abdominal masses. Liver and spleen not palpable.   No hernias appreciated.  Stool sample for occult testing is not indicated.   GU: No CVA tenderness.  No bladder fullness or masses.   Skin: No rashes, bruises or suspicious lesions. Lymph: No cervical or inguinal adenopathy. Neurologic: Grossly intact, no focal deficits, moving all 4 extremities. Psychiatric: Normal mood and affect.   Laboratory Data: Lab Results  Component Value Date   WBC 4.7 04/18/2015   HGB 9.7* 01/06/2015   HCT 39.9 04/18/2015   MCV 75.6* 01/06/2015   PLT 207 01/06/2015   Lab Results  Component Value Date   CREATININE 1.03* 04/18/2015   Lab Results  Component Value Date   HGBA1C 6.5 04/15/2015   Pertinent Imaging CLINICAL DATA: Microscopic hematuria  EXAM: RENAL / URINARY TRACT ULTRASOUND COMPLETE  COMPARISON: Abdominal and pelvic CT scan without contrast dated November 12, 2014.  FINDINGS: Right Kidney:  Length: 9.2 cm. Echogenicity within normal limits. No mass or hydronephrosis visualized.  Left Kidney:  Length: 10.0 cm. There are multiple renal parenchymal cysts on the left. The faintly calcified structure in the posterior aspect of the midpole seen on the previous study is not clearly evident on today's ultrasound. There is a midpole cyst measuring 1.5 x 1.6 x 1.8 cm. There is an upper pole cyst measuring 0.9 cm in diameter. There is a lower pole cyst measuring 1.1 cm in diameter. There is no hydronephrosis.  Bladder:  The urinary bladder is nondistended and poorly evaluated.  IMPRESSION: 1. There are simple appearing cysts in the left kidney. The radiodense structure in the midpole of the right kidney demonstrated on the previous CT scan is not clearly evident on today's ultrasound. 2. The right kidney is unremarkable. 3. The urinary bladder is not well evaluated on today's study. 4. If there is persistent microscopic hematuria and the urinary bladder has  been cleared, MRI of the kidneys would be a useful next imaging  step in an effort to evaluate the known hyperdense midpole lesion on the left which is not evident on today's ultrasound.   Electronically Signed  By: David Martinique M.D.  On: 07/19/2015 14:29          Urinalysis: Results for orders placed or performed in visit on 07/20/15  Microscopic Examination  Result Value Ref Range   WBC, UA 0-5 0 -  5 /hpf   RBC, UA 0-2 0 -  2 /hpf   Epithelial Cells (non renal) 0-10 0 - 10 /hpf   Renal Epithel, UA 0-10 (A) None seen /hpf   Mucus, UA Present (A) Not Estab.   Bacteria, UA Few (A) None seen/Few  Urinalysis, Complete  Result Value Ref Range   Specific Gravity, UA >1.030 (H) 1.005 - 1.030   pH, UA 5.5 5.0 - 7.5   Color, UA Green (A) Yellow   Appearance Ur Clear Clear   Leukocytes, UA Negative Negative   Protein, UA Trace (A) Negative/Trace   Glucose, UA Negative Negative   Ketones, UA Negative Negative   RBC, UA Negative Negative   Bilirubin, UA Negative Negative   Urobilinogen, Ur 0.2 0.2 - 1.0 mg/dL   Nitrite, UA Negative Negative   Microscopic Examination See below:     Assessment & Plan:    1. Interstitial cystitis:   Patient's was having symptoms of an UTI and her UA was suspicious for infection.  She did not receive her rescue solution today. She cannot afford to have the solutions more than once a week.   - Urinalysis, Complete  2. Microscopic hematuria:   Patient continues to have intermittent microscopic hematuria.  She did undergo a cystoscopy in 05/2014 and no GU pathology was found.  Her RUS did not demonstrate any malignancies.  She does not have any microscopic hematuria on today's UA.    Return in about 1 week (around 07/27/2015) for rescue solution.  Zara Council, [facility] [address], [place], [place] [address] [phone]

## 2015-07-21 ENCOUNTER — Ambulatory Visit: Payer: Commercial Managed Care - HMO | Admitting: Physical Therapy

## 2015-07-21 DIAGNOSIS — M545 Low back pain, unspecified: Secondary | ICD-10-CM

## 2015-07-21 DIAGNOSIS — R52 Pain, unspecified: Secondary | ICD-10-CM | POA: Diagnosis not present

## 2015-07-21 DIAGNOSIS — R296 Repeated falls: Secondary | ICD-10-CM | POA: Diagnosis not present

## 2015-07-21 NOTE — Therapy (Signed)
[facility] [address], [place], [address] Phone: [phone]   Fax:  [phone]  Physical Therapy Treatment  Patient Details  Name: [name] MRN: [id number] Date of Birth: [birth year] Referring Provider: Arther Dames  Encounter Date: 07/21/2015      PT End of Session - 07/21/15 1614    Visit Number 5   Number of Visits 13   Date for PT Re-Evaluation 08/18/15   Authorization Type 5   Authorization Time Period 10   PT Start Time [id number]   PT Stop Time 1430   PT Time Calculation (min) 45 min   Activity Tolerance Patient tolerated treatment well;No increased pain   Behavior During Therapy [facility] for tasks assessed/performed      Past Medical History  Diagnosis Date  . GERD (gastroesophageal reflux disease)   . Vertigo   . Essential hypertension, benign   . Paroxysmal supraventricular tachycardia ([place])   . Pure hypercholesterolemia   . MS (multiple sclerosis) ([place])   . Fibromyalgia   . Diverticulosis   . Diabetes mellitus without complication ([place])   . Osteoarthritis   . Dyslipidemia   . SOB (shortness of breath)     SECONDARY TO REFLUX  . Chest pain     SECONDARY TO GERD  . Peripheral neuropathy (HCC)     MILD  . Cancer ([place])     breast  . Depression   . Chronic interstitial cystitis   . History of fractured rib   . Chronic right hip pain   . History of TIA (transient ischemic attack)   . Carpal tunnel syndrome   . Thyroid nodule   . Hearing loss of left ear   . Vitamin B 12 deficiency   . Protein malnutrition ([place])   . Frequent falls   . Anxiety and depression   . Irritable bowel   . Recurrent UTI     Past Surgical History  Procedure Laterality Date  . Total abdominal hysterectomy w/ bilateral salpingoophorectomy    . Mastectomy Right     DX MASTITIS/NO CANCER.Marland KitchenSALINE IMPLANT  . Cholecystectomy    . Hemorrhoid surgery      WITH RECONSTRUCTION  . Bladder tacking      X 3   . Eye surgeries      WITH BUCKLE DETACHMENT OF THE RETINA  . Tonsillectomy    . Appendectomy    . Tympanoplasty    . Breast surgery    . Mastectomy    . Breast lumpectomy    . Colonoscopy with propofol N/A 01/21/2015    Procedure: COLONOSCOPY WITH PROPOFOL;  Surgeon: Josefine Class, MD;  Location: [facility] ENDOSCOPY;  Service: Endoscopy;  Laterality: N/A;  . Esophagogastroduodenoscopy N/A 01/21/2015    Procedure: ESOPHAGOGASTRODUODENOSCOPY (EGD);  Surgeon: Josefine Class, MD;  Location: [facility] ENDOSCOPY;  Service: Endoscopy;  Laterality: N/A;    There were no vitals filed for this visit.  Visit Diagnosis:  Bilateral low back pain without sciatica      Subjective Assessment - 07/21/15 1612    Subjective Patient reports she had a busy day yesterday with all of her tests. Reports no falls.   Patient is accompained by: Family member   Limitations Walking;House hold activities   Patient Stated Goals "to not fall as much, to decrease pain, to be less dizzy."   Currently in Pain? Yes   Pain Location --  all over   Pain Orientation Right;Left   Pain Descriptors /  Indicators Aching   Pain Type Chronic pain   Pain Onset More than a month ago   Pain Frequency Constant   Multiple Pain Sites Yes         Treatment to include  7 min nustep (llevel 2) for warm up/mobility and pain relief. Standing hip abduction, marching, heel raises 2x10 for strengthening. Side stepping on foam beam with 2# ankle weights x 10 laps with UE assist for balance as needed.  Seated LAQ with 2# ankle weights, hip adduction, hip abduction with green band x 15 reps. Cues and UE assist as needed for correct form and safety.  Manual therapy to low back in prone position, PA joint mobs over lumbar spine grade I x 3 bouts.  Pt has a lot of tenderness with this. Pt has limited lumbar extension and hypomobility of lumbar and thoracic spine.            PT Education - 07/21/15 1614    Education provided  Yes   Education Details exercises and set up.    Person(s) Educated Patient   Methods Explanation;Demonstration   Comprehension Verbalized understanding;Returned demonstration             PT Long Term Goals - 07/07/15 0952    PT LONG TERM GOAL #1   Title Patient will demonstrate imrpoved functional mobility by improved LEFS of > 40/80   Baseline 25/80   Time 6   Period Weeks   Status New   PT LONG TERM GOAL #2   Title Patient will demonstrate improved safety by improving gait speed to 10 meters in < 20 sec.    Baseline 25 sec   Time 6   Period Weeks   Status New   PT LONG TERM GOAL #3   Title Patient will demonstrate improved mobility by performing 5x STS in < 30 sec.    Baseline 44 sec   Time 6   Period Weeks   Status New               Plan - 07/21/15 1615    Clinical Impression Statement Patient reports less pain with manaual therapy to low back. Pt reports no falls recently.    Pt will benefit from skilled therapeutic intervention in order to improve on the following deficits Abnormal gait;Decreased activity tolerance;Decreased balance;Decreased strength;Decreased mobility;Difficulty walking;Dizziness;Pain;Decreased safety awareness;Decreased endurance;Decreased range of motion   PT Frequency 2x / week   PT Duration 6 weeks   PT Treatment/Interventions Moist Heat;Therapeutic exercise;Therapeutic activities;Gait training;Neuromuscular re-education;Balance training;Aquatic Therapy;Patient/family education;Manual techniques   Consulted and Agree with Plan of Care Patient        Problem List Patient Active Problem List   Diagnosis Date Noted  . Microscopic hematuria 07/12/2015  . Suprapubic pain, acute 05/08/2015  . Diarrhea 05/02/2015  . Urinary tract infectious disease 05/02/2015  . Interstitial cystitis 04/25/2015  . Boil, face 04/22/2015  . Need for immunization against influenza 04/15/2015  . Recurrent UTI 04/09/2015  . Dysuria 04/01/2015  .  Anxiety and depression 01/12/2014  . Carpal tunnel syndrome 01/12/2014  . DD (diverticular disease) 01/12/2014  . Carcinoma in situ, breast, ductal 01/12/2014  . H/O surgical procedure 01/12/2014  . H/O right mastectomy 01/12/2014  . Hypercholesteremia 01/12/2014  . DS (disseminated sclerosis) ([place]) 01/12/2014  . Arthritis, degenerative 01/12/2014  . Thyroid nodule 01/12/2014  . Cyanocobalamine deficiency (non anemic) 01/12/2014  . DM (diabetes mellitus) type II controlled, neurological manifestation ([place]) 06/26/2010  . Depression, major, recurrent, in partial remission (  [place]) 06/26/2010  . Frequent falls 06/26/2010  . Stricture and stenosis of esophagus 06/26/2010  . Gastro-esophageal reflux disease without esophagitis 06/26/2010  . Fibromyalgia 06/26/2010  . Chronic interstitial cystitis 06/26/2010  . Acid reflux 06/26/2010  . H/O malignant neoplasm of breast 06/26/2010  . Hypertension goal BP (blood pressure) < 150/90 06/01/2009  . H/O paroxysmal supraventricular tachycardia 06/01/2009  . Paroxysmal supraventricular tachycardia ([place]) 06/01/2009  . Irritable bowel syndrome with constipation and diarrhea 06/16/2008  . Diarrhea, functional 06/16/2008  . D (diarrhea) 06/16/2008    Imogean Ciampa, PT, MPT, GCS 07/21/2015, 4:19 PM  [facility] [address] [place], [address] Phone: [phone]   Fax:  [phone]  Name: [name] MRN: [id number] Date of Birth: 11/05/1929

## 2015-07-25 ENCOUNTER — Ambulatory Visit: Payer: Commercial Managed Care - HMO | Admitting: Physical Therapy

## 2015-07-25 ENCOUNTER — Other Ambulatory Visit: Payer: Self-pay | Admitting: Pharmacist

## 2015-07-25 DIAGNOSIS — R296 Repeated falls: Secondary | ICD-10-CM | POA: Diagnosis not present

## 2015-07-25 DIAGNOSIS — M545 Low back pain, unspecified: Secondary | ICD-10-CM

## 2015-07-25 DIAGNOSIS — R52 Pain, unspecified: Secondary | ICD-10-CM | POA: Diagnosis not present

## 2015-07-25 NOTE — Therapy (Signed)
[facility] [address], [place], [address] Phone: [phone]   Fax:  [phone]  Physical Therapy Treatment  Patient Details  Name: [name] MRN: [id number] Date of Birth: [birth year] Referring Provider: Arther Dames  Encounter Date: 07/25/2015      PT End of Session - 07/25/15 1350    Visit Number 6   Number of Visits 13   Date for PT Re-Evaluation 08/18/15   Authorization Type 6   Authorization Time Period 10   PT Start Time 1300   PT Stop Time 1340   PT Time Calculation (min) 40 min   Activity Tolerance Patient tolerated treatment well;No increased pain   Behavior During Therapy [facility] for tasks assessed/performed      Past Medical History  Diagnosis Date  . GERD (gastroesophageal reflux disease)   . Vertigo   . Essential hypertension, benign   . Paroxysmal supraventricular tachycardia ([place])   . Pure hypercholesterolemia   . MS (multiple sclerosis) ([place])   . Fibromyalgia   . Diverticulosis   . Diabetes mellitus without complication ([place])   . Osteoarthritis   . Dyslipidemia   . SOB (shortness of breath)     SECONDARY TO REFLUX  . Chest pain     SECONDARY TO GERD  . Peripheral neuropathy (HCC)     MILD  . Cancer ([place])     breast  . Depression   . Chronic interstitial cystitis   . History of fractured rib   . Chronic right hip pain   . History of TIA (transient ischemic attack)   . Carpal tunnel syndrome   . Thyroid nodule   . Hearing loss of left ear   . Vitamin B 12 deficiency   . Protein malnutrition ([place])   . Frequent falls   . Anxiety and depression   . Irritable bowel   . Recurrent UTI     Past Surgical History  Procedure Laterality Date  . Total abdominal hysterectomy w/ bilateral salpingoophorectomy    . Mastectomy Right     DX MASTITIS/NO CANCER.Marland KitchenSALINE IMPLANT  . Cholecystectomy    . Hemorrhoid surgery      WITH RECONSTRUCTION  . Bladder tacking      X 3   . Eye surgeries      WITH BUCKLE DETACHMENT OF THE RETINA  . Tonsillectomy    . Appendectomy    . Tympanoplasty    . Breast surgery    . Mastectomy    . Breast lumpectomy    . Colonoscopy with propofol N/A 01/21/2015    Procedure: COLONOSCOPY WITH PROPOFOL;  Surgeon: Josefine Class, MD;  [facility] ENDOSCOPY;  Service: Endoscopy;  Laterality: N/A;  . Esophagogastroduodenoscopy N/A 01/21/2015    Procedure: ESOPHAGOGASTRODUODENOSCOPY (EGD);  Surgeon: Josefine Class, MD;  Location: [facility] ENDOSCOPY;  Service: Endoscopy;  Laterality: N/A;    There were no vitals filed for this visit.  Visit Diagnosis:  Bilateral low back pain without sciatica  Falls frequently      Subjective Assessment - 07/25/15 1348    Subjective Patient reports she had an "episode" over the weekend of epigastric pain per her description.  Reports no recent falls.    Limitations Walking;House hold activities   Patient Stated Goals "to not fall as much, to decrease pain, to be less dizzy."   Currently in Pain? Yes   Pain Orientation Right;Left   Pain Descriptors / Indicators Aching;Sore   Pain Type Chronic  pain   Pain Onset More than a month ago   Pain Frequency Constant   Multiple Pain Sites Yes          Treatment to include  7 min nustep (llevel 2) for warm up/mobility and pain relief. Standing hip abduction, marching, heel raises 2x10 for strengthening. Side stepping on foam beam with 2# ankle weights x 10 laps with UE assist for balance as needed.  Seated LAQ with 2# ankle weights, hip adduction, hip abduction with green band x 15 reps. Cues and UE assist as needed for correct form and safety.  Balance training with wide, narrow and tendem stance x 1 min each and using UE for safety and balance as needed. Close cga for safety.  Manual therapy to low back in prone position, PA joint mobs over lumbar spine grade I x 3 bouts. Pt has a lot of tenderness with this. Pt has limited lumbar  extension and hypomobility of lumbar and thoracic spine.        PT Education - 07/25/15 1350    Education provided Yes   Education Details exercises, benefits of STM and joint mobs for decreased pain   Person(s) Educated Patient   Methods Explanation;Demonstration   Comprehension Verbalized understanding;Returned demonstration             PT Long Term Goals - 07/07/15 [id number]    PT LONG TERM GOAL #1   Title Patient will demonstrate imrpoved functional mobility by improved LEFS of > 40/80   Baseline 25/80   Time 6   Period Weeks   Status New   PT LONG TERM GOAL #2   Title Patient will demonstrate improved safety by improving gait speed to 10 meters in < 20 sec.    Baseline 25 sec   Time 6   Period Weeks   Status New   PT LONG TERM GOAL #3   Title Patient will demonstrate improved mobility by performing 5x STS in < 30 sec.    Baseline 44 sec   Time 6   Period Weeks   Status New               Plan - 07/25/15 1351    Clinical Impression Statement patient has general pain throughout body. Reporting no falls since initiating therapy. Progressing with balance and strength for safety and functional independence.    Pt will benefit from skilled therapeutic intervention in order to improve on the following deficits Abnormal gait;Decreased activity tolerance;Decreased balance;Decreased strength;Decreased mobility;Difficulty walking;Dizziness;Pain;Decreased safety awareness;Decreased endurance;Decreased range of motion   Rehab Potential Fair   PT Frequency 2x / week   PT Duration 6 weeks   PT Treatment/Interventions Moist Heat;Therapeutic exercise;Therapeutic activities;Gait training;Neuromuscular re-education;Balance training;Aquatic Therapy;Patient/family education;Manual techniques   Consulted and Agree with Plan of Care Patient        Problem List Patient Active Problem List   Diagnosis Date Noted  . Microscopic hematuria 07/12/2015  . Suprapubic pain, acute  05/08/2015  . Diarrhea 05/02/2015  . Urinary tract infectious disease 05/02/2015  . Interstitial cystitis 04/25/2015  . Boil, face 04/22/2015  . Need for immunization against influenza 04/15/2015  . Recurrent UTI 04/09/2015  . Dysuria 04/01/2015  . Anxiety and depression 01/12/2014  . Carpal tunnel syndrome 01/12/2014  . DD (diverticular disease) 01/12/2014  . Carcinoma in situ, breast, ductal 01/12/2014  . H/O surgical procedure 01/12/2014  . H/O right mastectomy 01/12/2014  . Hypercholesteremia 01/12/2014  . DS (disseminated sclerosis) ([place]) 01/12/2014  . Arthritis, degenerative 01/12/2014  .  Thyroid nodule 01/12/2014  . Cyanocobalamine deficiency (non anemic) 01/12/2014  . DM (diabetes mellitus) type II controlled, neurological manifestation ([place]) 06/26/2010  . Depression, major, recurrent, in partial remission ([place]) 06/26/2010  . Frequent falls 06/26/2010  . Stricture and stenosis of esophagus 06/26/2010  . Gastro-esophageal reflux disease without esophagitis 06/26/2010  . Fibromyalgia 06/26/2010  . Chronic interstitial cystitis 06/26/2010  . Acid reflux 06/26/2010  . H/O malignant neoplasm of breast 06/26/2010  . Hypertension goal BP (blood pressure) < 150/90 06/01/2009  . H/O paroxysmal supraventricular tachycardia 06/01/2009  . Paroxysmal supraventricular tachycardia ([place]) 06/01/2009  . Irritable bowel syndrome with constipation and diarrhea 06/16/2008  . Diarrhea, functional 06/16/2008  . D (diarrhea) 06/16/2008    Garvey Westcott, PT, MPT, GCS 07/25/2015, 1:53 PM  [facility] [address] [place], [address] Phone: [phone]   Fax:  [phone]  Name: [name] MRN: [id number] Date of Birth: 04/02/1930

## 2015-07-25 NOTE — Patient Outreach (Signed)
[name] is a 81 y.o. female referred to pharmacy for medication management related to polypharmacy. Called and spoke with [name] who is interested in a home visit to have me review her medications. Patient is agreeable to a co-visit with Nurse Care Manager Janci Minor. Reports that she is on her way to her rehab therapy appointment now. Reports that she has her therapy appointments on Mondays and Thursdays. Asks that I call back to confirm a time on a Wednesday or Friday.  Will reach out to Nurse Care Manager Janci Minor for care coordination and then follow up with [name].  Harlow Asa, PharmD Clinical Pharmacist [facility] [phone]

## 2015-07-27 ENCOUNTER — Encounter: Payer: Self-pay | Admitting: Urology

## 2015-07-27 ENCOUNTER — Other Ambulatory Visit: Payer: Self-pay | Admitting: Pharmacist

## 2015-07-27 ENCOUNTER — Ambulatory Visit (INDEPENDENT_AMBULATORY_CARE_PROVIDER_SITE_OTHER): Payer: Commercial Managed Care - HMO | Admitting: Urology

## 2015-07-27 VITALS — BP 156/88 | HR 69 | Ht 60.0 in | Wt 157.6 lb

## 2015-07-27 DIAGNOSIS — R1013 Epigastric pain: Secondary | ICD-10-CM | POA: Diagnosis not present

## 2015-07-27 DIAGNOSIS — R3129 Other microscopic hematuria: Secondary | ICD-10-CM | POA: Diagnosis not present

## 2015-07-27 DIAGNOSIS — N3010 Interstitial cystitis (chronic) without hematuria: Secondary | ICD-10-CM

## 2015-07-27 LAB — URINALYSIS, COMPLETE
BILIRUBIN UA: NEGATIVE
GLUCOSE, UA: NEGATIVE
KETONES UA: NEGATIVE
Leukocytes, UA: NEGATIVE
Nitrite, UA: NEGATIVE
PH UA: 5.5 (ref 5.0–7.5)
PROTEIN UA: NEGATIVE
Specific Gravity, UA: 1.025 (ref 1.005–1.030)
UUROB: 0.2 mg/dL (ref 0.2–1.0)

## 2015-07-27 LAB — MICROSCOPIC EXAMINATION

## 2015-07-27 MED ORDER — SODIUM BICARBONATE 8.4 % IV SOLN
11.0000 mL | Freq: Once | INTRAVENOUS | Status: AC
  Administered 2015-07-27: 11 mL

## 2015-07-27 NOTE — Patient Outreach (Signed)
[name] is a 81 y.o. female referred to pharmacy for medication management related to polypharmacy. Called to schedule a co-visit with Nurse Care Manager Janci Minor with the patient. Left a HIPAA compliant message on the patient's voicemail. If have not heard from patient by next week, will give her another call at that time.   Harlow Asa, PharmD Clinical Pharmacist [facility] [phone]

## 2015-07-27 NOTE — Progress Notes (Signed)
11:12 AM   [name] 02/03/1930 [id number]  Referring provider: Bobetta Lime, MD [address] [place], [place] [address]  Chief Complaint  Patient presents with  . Interstitial Cystitis    Rescue Solution    HPI:  Patient is an 81 year old white female with a history of IC who presents today for rescue installation.      Patient had an episode over the weekend of epigastric pain that radiated into the left upper quadrant. It was associated with hiccups and regurgitation of acid. She states the pain was different than any other type of pain she had experienced in the past. She tried to wake her husband to go to the emergency room, but he was sound asleep and she was not persistent and tried to wake him up. She did contact her primary care physician and has an appointment with her next week.  Today, she is experiencing suprapubic burning. Her UA is unremarkable. She is not had any gross hematuria. She's not had any fevers, chills, nausea or vomiting.  PMH: Past Medical History  Diagnosis Date  . GERD (gastroesophageal reflux disease)   . Vertigo   . Essential hypertension, benign   . Paroxysmal supraventricular tachycardia ([place])   . Pure hypercholesterolemia   . MS (multiple sclerosis) ([place])   . Fibromyalgia   . Diverticulosis   . Diabetes mellitus without complication ([place])   . Osteoarthritis   . Dyslipidemia   . SOB (shortness of breath)     SECONDARY TO REFLUX  . Chest pain     SECONDARY TO GERD  . Peripheral neuropathy (HCC)     MILD  . Cancer ([place])     breast  . Depression   . Chronic interstitial cystitis   . History of fractured rib   . Chronic right hip pain   . History of TIA (transient ischemic attack)   . Carpal tunnel syndrome   . Thyroid nodule   . Hearing loss of left ear   . Vitamin B 12 deficiency   . Protein malnutrition ([place])   . Frequent falls   . Anxiety and depression   . Irritable bowel   . Recurrent UTI      Surgical History: Past Surgical History  Procedure Laterality Date  . Total abdominal hysterectomy w/ bilateral salpingoophorectomy    . Mastectomy Right     DX MASTITIS/NO CANCER.Marland KitchenSALINE IMPLANT  . Cholecystectomy    . Hemorrhoid surgery      WITH RECONSTRUCTION  . Bladder tacking      X 3  . Eye surgeries      WITH BUCKLE DETACHMENT OF THE RETINA  . Tonsillectomy    . Appendectomy    . Tympanoplasty    . Breast surgery    . Mastectomy    . Breast lumpectomy    . Colonoscopy with propofol N/A 01/21/2015    Procedure: COLONOSCOPY WITH PROPOFOL;  Surgeon: Josefine Class, MD;  Location[facility] ENDOSCOPY;  Service: Endoscopy;  Laterality: N/A;  . Esophagogastroduodenoscopy N/A 01/21/2015    Procedure: ESOPHAGOGASTRODUODENOSCOPY (EGD);  Surgeon: Josefine Class, MD;  Location: [facility] ENDOSCOPY;  Service: Endoscopy;  Laterality: N/A;    Home Medications:    Medication List       This list is accurate as of: 07/27/15 11:12 AM.  Always use your most recent med list.               acetaminophen 500 MG tablet  Commonly  known as:  TYLENOL  Take 500 mg by mouth every 6 (six) hours as needed.     acetic acid-aluminum acetate 2 % otic solution  Commonly known as:  DOMEBORO OTIC  every 3 (three) hours.     CRESTOR 5 MG tablet  Generic drug:  rosuvastatin  Take 2.5 mg by mouth.     [employer] VITAMIN B12 2000 MCG tablet  Generic drug:  cyanocobalamin  Take by mouth.     dicyclomine 10 MG capsule  Commonly known as:  BENTYL  TAKE 1 CAPSULE (10 MG TOTAL) BY MOUTH EVERY 8 (EIGHT) HOURS AS NEEDED.     doxycycline 100 MG tablet  Commonly known as:  VIBRA-TABS  Take 1 tablet (100 mg total) by mouth 2 (two) times daily.     ferrous fumarate 325 (106 FE) MG Tabs tablet  Commonly known as:  HEMOCYTE - 106 mg FE  Take 1 tablet by mouth. Reported on 07/27/2015     ferrous sulfate 325 (65 FE) MG tablet  Take by mouth.     glucose blood test strip  1 each by Other route as  needed for other. Use as instructed     meclizine 25 MG tablet  Commonly known as:  ANTIVERT  TAKE ONE TO TWO TABLETS BY MOUTH EVERY 8 HOURS AS NEEDED     metFORMIN 500 MG tablet  Commonly known as:  GLUCOPHAGE  Take by mouth.     omeprazole 20 MG capsule  Commonly known as:  PRILOSEC  Take 20 mg by mouth daily.     oxyCODONE-acetaminophen 7.5-325 MG tablet  Commonly known as:  PERCOCET  Take 1 tablet by mouth every 4 (four) hours as needed for severe pain.     PROBIOTIC COLON SUPPORT Caps  Take by mouth.     sertraline 50 MG tablet  Commonly known as:  ZOLOFT  TAKE ONE TABLET BY MOUTH ONE TIME DAILY     URIBEL 118 MG Caps  One tablet four times daily as needed with 8 oz of water     VITAMIN D-1000 MAX ST 1000 UNITS tablet  Generic drug:  Cholecalciferol  Take by mouth.        Allergies:  Allergies  Allergen Reactions  . Biaxin [Clarithromycin] Other (See Comments) and Diarrhea  . Copaxone [Glatiramer Acetate]   . Decadron [Dexamethasone] Nausea And Vomiting and Other (See Comments)    Other reaction(s): Muscle Pain Reaction:  Abdominal pain  . Dexamethasone Sodium Phosphate Other (See Comments)  . Diltiazem Hcl Other (See Comments)    Reaction:  Unknown   . Diltiazem Hcl Other (See Comments)  . Flagyl [Metronidazole] Nausea Only and Diarrhea    Other reaction(s): Vomiting  . Gabapentin Other (See Comments)    "burning all over" Reaction:  Unknown   . Iohexol Swelling and Other (See Comments)     Desc: tongue swelling with "IVP dye" sccording to nurses notes with a lumbar myelo-12/09- asm, Onset Date: [id number]  Pts tongue swells.  Clementeen Hoof [Iodinated Diagnostic Agents] Swelling and Other (See Comments)    Passed out  Pts tongue swells.   . Levothyroxine Nausea Only  . Sulfonamide Derivatives Other (See Comments)    Reaction:  Unknown   . Synthroid [Levothyroxine Sodium]   . Copaxone  [Glatiramer] Rash  . Interferon Beta-1a Other (See Comments) and Rash     Reaction:  Unknown     Family History: Family History  Problem Relation Age of Onset  .  Arthritis Sister   . Aneurysm Brother   . Heart disease Daughter   . ALS Mother   . Colon cancer    . Kidney disease Neg Hx     Social History:  reports that she has never smoked. She has never used smokeless tobacco. She reports that she does not drink alcohol or use illicit drugs.  ROS: UROLOGY Frequent Urination?: Yes Hard to postpone urination?: No Burning/pain with urination?: Yes Get up at night to urinate?: Yes Leakage of urine?: Yes Urine stream starts and stops?: No Trouble starting stream?: No Do you have to strain to urinate?: No Blood in urine?: No Urinary tract infection?: No Sexually transmitted disease?: No Injury to kidneys or bladder?: No Painful intercourse?: No Weak stream?: No Currently pregnant?: No Vaginal bleeding?: No Last menstrual period?: n  Gastrointestinal Nausea?: No Vomiting?: No Indigestion/heartburn?: No Diarrhea?: No Constipation?: No  Constitutional Fever: No Night sweats?: Yes Weight loss?: No Fatigue?: Yes  Skin Skin rash/lesions?: No Itching?: No  Eyes Blurred vision?: Yes Double vision?: No  Ears/Nose/Throat Sore throat?: Yes Sinus problems?: No  Hematologic/Lymphatic Swollen glands?: No Easy bruising?: No  Cardiovascular Leg swelling?: No Chest pain?: No  Respiratory Cough?: No Shortness of breath?: No  Endocrine Excessive thirst?: No  Musculoskeletal Back pain?: Yes Joint pain?: Yes  Neurological Headaches?: Yes Dizziness?: Yes  Psychologic Depression?: Yes Anxiety?: Yes  Physical Exam: Blood pressure 156/88, pulse 69, height 5' (1.524 m), weight 157 lb 9.6 oz (71.487 kg). Constitutional: Well nourished. Alert and oriented, No acute distress. HEENT: [place] AT, moist mucus membranes. Trachea midline, no masses. Cardiovascular: No clubbing, cyanosis, or edema. Respiratory: Normal respiratory  effort, no increased work of breathing. GI: Abdomen is soft, non tender, non distended, no abdominal masses. Liver and spleen not palpable.  No hernias appreciated.  Stool sample for occult testing is not indicated.   GU: No CVA tenderness.  No bladder fullness or masses.   Skin: No rashes, bruises or suspicious lesions. Lymph: No cervical or inguinal adenopathy. Neurologic: Grossly intact, no focal deficits, moving all 4 extremities. Psychiatric: Normal mood and affect.   Laboratory Data: Lab Results  Component Value Date   WBC 4.7 04/18/2015   HGB 9.7* 01/06/2015   HCT 39.9 04/18/2015   MCV 75.6* 01/06/2015   PLT 207 01/06/2015   Lab Results  Component Value Date   CREATININE 1.03* 04/18/2015   Lab Results  Component Value Date   HGBA1C 6.5 04/15/2015   Pertinent Imaging CLINICAL DATA: Microscopic hematuria  EXAM: RENAL / URINARY TRACT ULTRASOUND COMPLETE  COMPARISON: Abdominal and pelvic CT scan without contrast dated November 12, 2014.  FINDINGS: Right Kidney:  Length: 9.2 cm. Echogenicity within normal limits. No mass or hydronephrosis visualized.  Left Kidney:  Length: 10.0 cm. There are multiple renal parenchymal cysts on the left. The faintly calcified structure in the posterior aspect of the midpole seen on the previous study is not clearly evident on today's ultrasound. There is a midpole cyst measuring 1.5 x 1.6 x 1.8 cm. There is an upper pole cyst measuring 0.9 cm in diameter. There is a lower pole cyst measuring 1.1 cm in diameter. There is no hydronephrosis.  Bladder:  The urinary bladder is nondistended and poorly evaluated.  IMPRESSION: 1. There are simple appearing cysts in the left kidney. The radiodense structure in the midpole of the right kidney demonstrated on the previous CT scan is not clearly evident on today's ultrasound. 2. The right kidney is unremarkable. 3. The urinary  bladder is not well evaluated on today's  study. 4. If there is persistent microscopic hematuria and the urinary bladder has been cleared, MRI of the kidneys would be a useful next imaging step in an effort to evaluate the known hyperdense midpole lesion on the left which is not evident on today's ultrasound.   Electronically Signed  By: David Martinique M.D.  On: 07/19/2015 14:29          Urinalysis: Results for orders placed or performed in visit on 07/27/15  Microscopic Examination  Result Value Ref Range   WBC, UA 0-5 0 -  5 /hpf   RBC, UA 0-2 0 -  2 /hpf   Epithelial Cells (non renal) 0-10 0 - 10 /hpf   Renal Epithel, UA 0-10 (A) None seen /hpf   Mucus, UA Present (A) Not Estab.   Bacteria, UA Few (A) None seen/Few  Urinalysis, Complete  Result Value Ref Range   Specific Gravity, UA 1.025 1.005 - 1.030   pH, UA 5.5 5.0 - 7.5   Color, UA Green (A) Yellow   Appearance Ur Clear Clear   Leukocytes, UA Negative Negative   Protein, UA Negative Negative/Trace   Glucose, UA Negative Negative   Ketones, UA Negative Negative   RBC, UA Trace (A) Negative   Bilirubin, UA Negative Negative   Urobilinogen, Ur 0.2 0.2 - 1.0 mg/dL   Nitrite, UA Negative Negative   Microscopic Examination See below:    Assessment & Plan:    1. Interstitial cystitis:   Patient received her rescue solution today.  She will RTC in one week for her next installation.   - Urinalysis, Complete  2. Microscopic hematuria:   Patient continues to have intermittent microscopic hematuria.  She did undergo a cystoscopy in 05/2014 and no GU pathology was found.  Her RUS did not demonstrate any malignancies.  She does not have any microscopic hematuria on today's UA.    3.  Epigastric pain:   Patient is advised that if she should experience another episode similar to the one she had over the weekend to seek treatment in the emergency room immediately.  Return in about 1 week (around 08/03/2015) for rescue solution.  Zara Council,  [facility] [address] [place], [place] [address] [phone]

## 2015-07-27 NOTE — Progress Notes (Signed)
Bladder Rescue Solution Instillation  Due to IC patient is present today for a Rescue Solution Treatment.  Patient was cleaned and prepped in a sterile fashion with betadine and lidocaine 2% jelly was instilled into the urethra.  A 14 FR catheter was inserted, urine return was noted 61ml, urine was yellow in color.  Instilled a solution consisting of 72ml of Sodium Bicarb, 2 ml Lidocaine and 1 ml of Heparin. The catheter was then removed. Patient tolerated well, no complications were noted.   Performed by: K.Russell,CMA

## 2015-07-28 ENCOUNTER — Other Ambulatory Visit: Payer: Self-pay | Admitting: *Deleted

## 2015-07-28 ENCOUNTER — Ambulatory Visit: Payer: Commercial Managed Care - HMO | Admitting: Physical Therapy

## 2015-07-28 DIAGNOSIS — M545 Low back pain, unspecified: Secondary | ICD-10-CM

## 2015-07-28 DIAGNOSIS — R296 Repeated falls: Secondary | ICD-10-CM | POA: Diagnosis not present

## 2015-07-28 DIAGNOSIS — R52 Pain, unspecified: Secondary | ICD-10-CM | POA: Diagnosis not present

## 2015-07-28 NOTE — Patient Outreach (Signed)
RNCM spoke with [place] Pharmacist on 07/27/15 about pt. Pharmacist has been in touch with pt and has made a plan to schedule a home visit. Pharmacist was made aware by pt, that pt has a busy schedule. Pharmacist has let pt know this will be a co-visit with this RNCM and pt is agreeable. Pharmacist has plans to make the pt outreach visit appointment and let RNCM know of date and time.  Plan: RNCM will await date and time of scheduled co-visit with pt.  Rutherford Limerick RN, BSN  [facility] Management [phone])

## 2015-07-28 NOTE — Therapy (Signed)
[facility] [address] [place], [address] Phone: [phone]   Fax:  [phone]  Physical Therapy Treatment  Patient Details  Name: [name] MRN: [id number] Date of Birth: [birth year] Referring Provider: Arther Dames  Encounter Date: 07/28/2015      PT End of Session - 07/28/15 1339    Visit Number 7   Number of Visits 13   Authorization Type 7   Authorization Time Period 10   PT Start Time 1300   PT Stop Time 1340   PT Time Calculation (min) 40 min   Activity Tolerance Patient tolerated treatment well;No increased pain;Patient limited by pain;Patient limited by fatigue   Behavior During Therapy [facility] for tasks assessed/performed      Past Medical History  Diagnosis Date  . GERD (gastroesophageal reflux disease)   . Vertigo   . Essential hypertension, benign   . Paroxysmal supraventricular tachycardia ([place])   . Pure hypercholesterolemia   . MS (multiple sclerosis) ([place])   . Fibromyalgia   . Diverticulosis   . Diabetes mellitus without complication ([place])   . Osteoarthritis   . Dyslipidemia   . SOB (shortness of breath)     SECONDARY TO REFLUX  . Chest pain     SECONDARY TO GERD  . Peripheral neuropathy (HCC)     MILD  . Cancer ([place])     breast  . Depression   . Chronic interstitial cystitis   . History of fractured rib   . Chronic right hip pain   . History of TIA (transient ischemic attack)   . Carpal tunnel syndrome   . Thyroid nodule   . Hearing loss of left ear   . Vitamin B 12 deficiency   . Protein malnutrition ([employer])   . Frequent falls   . Anxiety and depression   . Irritable bowel   . Recurrent UTI     Past Surgical History  Procedure Laterality Date  . Total abdominal hysterectomy w/ bilateral salpingoophorectomy    . Mastectomy Right     DX MASTITIS/NO CANCER.Marland KitchenSALINE IMPLANT  . Cholecystectomy    . Hemorrhoid surgery      WITH RECONSTRUCTION  . Bladder  tacking      X 3  . Eye surgeries      WITH BUCKLE DETACHMENT OF THE RETINA  . Tonsillectomy    . Appendectomy    . Tympanoplasty    . Breast surgery    . Mastectomy    . Breast lumpectomy    . Colonoscopy with propofol N/A 01/21/2015    Procedure: COLONOSCOPY WITH PROPOFOL;  Surgeon: Josefine Class, MD;  Location: [facility] ENDOSCOPY;  Service: Endoscopy;  Laterality: N/A;  . Esophagogastroduodenoscopy N/A 01/21/2015    Procedure: ESOPHAGOGASTRODUODENOSCOPY (EGD);  Surgeon: Josefine Class, MD;  Location: [facility] ENDOSCOPY;  Service: Endoscopy;  Laterality: N/A;    There were no vitals filed for this visit.  Visit Diagnosis:  Bilateral low back pain without sciatica  Falls frequently      Subjective Assessment - 07/28/15 1336    Subjective Patient reports " all over" pain. No falls.    Patient is accompained by: Family member   Limitations Walking;House hold activities   Patient Stated Goals "to not fall as much, to decrease pain, to be less dizzy."   Currently in Pain? Yes   Pain Descriptors / Indicators Aching;Sore   Pain Type Chronic pain   Pain Onset More than a month ago  Pain Frequency Constant   Multiple Pain Sites Yes          Treatment to include  7 min nustep (llevel 2) for warm up/mobility and pain relief. Standing hip abduction, marching, heel raises 2x10 for strengthening. Side stepping on foam beam with 2# ankle weights x 10 laps with UE assist for balance as needed.  Seated LAQ with 2# ankle weights, hip adduction, hip abduction with green band x 15 reps. Cues and UE assist as needed for correct form and safety.  Balance training with wide, narrow and tendem stance x 1 min each and using UE for safety and balance as needed. Close cga for safety.  Manual therapy to low back in prone position, PA joint mobs over lumbar spine grade I x 3 bouts. Pt has a lot of tenderness with this. Pt has limited lumbar extension and hypomobility of lumbar and thoracic  spine.           PT Education - 07/28/15 1338    Education provided Yes   Education Details improtance of movement for arthritis.   Person(s) Educated Patient   Methods Explanation   Comprehension Verbalized understanding             PT Long Term Goals - 07/07/15 [id number]    PT LONG TERM GOAL #1   Title Patient will demonstrate imrpoved functional mobility by improved LEFS of > 40/80   Baseline 25/80   Time 6   Period Weeks   Status New   PT LONG TERM GOAL #2   Title Patient will demonstrate improved safety by improving gait speed to 10 meters in < 20 sec.    Baseline 25 sec   Time 6   Period Weeks   Status New   PT LONG TERM GOAL #3   Title Patient will demonstrate improved mobility by performing 5x STS in < 30 sec.    Baseline 44 sec   Time 6   Period Weeks   Status New               Plan - 07/28/15 1340    Clinical Impression Statement Patient has significant general pain. Seems to be benefitting from manual therapy and STM for pain. Making some improvements with strengthening.    Pt will benefit from skilled therapeutic intervention in order to improve on the following deficits Abnormal gait;Decreased activity tolerance;Decreased balance;Decreased strength;Decreased mobility;Difficulty walking;Dizziness;Pain;Decreased safety awareness;Decreased endurance;Decreased range of motion   Rehab Potential Fair   PT Frequency 2x / week   PT Duration 6 weeks   PT Treatment/Interventions Moist Heat;Therapeutic exercise;Therapeutic activities;Gait training;Neuromuscular re-education;Balance training;Aquatic Therapy;Patient/family education;Manual techniques   Consulted and Agree with Plan of Care Patient        Problem List Patient Active Problem List   Diagnosis Date Noted  . Microscopic hematuria 07/12/2015  . Suprapubic pain, acute 05/08/2015  . Diarrhea 05/02/2015  . Urinary tract infectious disease 05/02/2015  . Interstitial cystitis 04/25/2015  .  Boil, face 04/22/2015  . Need for immunization against influenza 04/15/2015  . Recurrent UTI 04/09/2015  . Dysuria 04/01/2015  . Anxiety and depression 01/12/2014  . Carpal tunnel syndrome 01/12/2014  . DD (diverticular disease) 01/12/2014  . Carcinoma in situ, breast, ductal 01/12/2014  . H/O surgical procedure 01/12/2014  . H/O right mastectomy 01/12/2014  . Hypercholesteremia 01/12/2014  . DS (disseminated sclerosis) ([place]) 01/12/2014  . Arthritis, degenerative 01/12/2014  . Thyroid nodule 01/12/2014  . Cyanocobalamine deficiency (non anemic) 01/12/2014  . DM (diabetes  mellitus) type II controlled, neurological manifestation ([place]) 06/26/2010  . Depression, major, recurrent, in partial remission ([place]) 06/26/2010  . Frequent falls 06/26/2010  . Stricture and stenosis of esophagus 06/26/2010  . Gastro-esophageal reflux disease without esophagitis 06/26/2010  . Fibromyalgia 06/26/2010  . Chronic interstitial cystitis 06/26/2010  . Acid reflux 06/26/2010  . H/O malignant neoplasm of breast 06/26/2010  . Hypertension goal BP (blood pressure) < 150/90 06/01/2009  . H/O paroxysmal supraventricular tachycardia 06/01/2009  . Paroxysmal supraventricular tachycardia ([place]) 06/01/2009  . Irritable bowel syndrome with constipation and diarrhea 06/16/2008  . Diarrhea, functional 06/16/2008  . D (diarrhea) 06/16/2008    Rayyan Orsborn, PT, MPT, GCS 07/28/2015, 1:44 PM  [facility] [address] [place], [address] Phone: [phone]   Fax:  [phone]  Name: [name] MRN: [id number] Date of Birth: 1929/11/13

## 2015-08-02 ENCOUNTER — Encounter: Payer: Self-pay | Admitting: Family Medicine

## 2015-08-02 ENCOUNTER — Ambulatory Visit (INDEPENDENT_AMBULATORY_CARE_PROVIDER_SITE_OTHER): Payer: Commercial Managed Care - HMO | Admitting: Family Medicine

## 2015-08-02 VITALS — BP 148/76 | HR 83 | Temp 98.3°F | Resp 12 | Wt 157.7 lb

## 2015-08-02 DIAGNOSIS — R1084 Generalized abdominal pain: Secondary | ICD-10-CM

## 2015-08-02 DIAGNOSIS — K5790 Diverticulosis of intestine, part unspecified, without perforation or abscess without bleeding: Secondary | ICD-10-CM | POA: Diagnosis not present

## 2015-08-02 DIAGNOSIS — K582 Mixed irritable bowel syndrome: Secondary | ICD-10-CM | POA: Diagnosis not present

## 2015-08-02 DIAGNOSIS — Z853 Personal history of malignant neoplasm of breast: Secondary | ICD-10-CM

## 2015-08-02 DIAGNOSIS — R1031 Right lower quadrant pain: Secondary | ICD-10-CM | POA: Insufficient documentation

## 2015-08-02 DIAGNOSIS — K219 Gastro-esophageal reflux disease without esophagitis: Secondary | ICD-10-CM | POA: Diagnosis not present

## 2015-08-02 MED ORDER — OMEPRAZOLE 40 MG PO CPDR: 40.0000 mg | DELAYED_RELEASE_CAPSULE | Freq: Every day | ORAL | Status: DC

## 2015-08-02 NOTE — Progress Notes (Signed)
Name: [name]   MRN: [id number]    DOB: February 29, 1930   Date:08/02/2015       Progress Note  Subjective  Chief Complaint  Chief Complaint  Patient presents with  . Abdominal Pain    epigastric pain that radiated to the sternum area. patient also has had some right lower quadrant pain that radiated to the the right lower back. patient has had an ultrasound of her kidneys that was neg and stone was r/o.     HPI  [name] is a 81 year old female with known history of MS, HTN, OA, Chronic pain, DM II w/o complication, Diverticular disorder, Interstitial Cystitis (followed by Urology), IBS constipation/diarrhea alternating as some of her medical issues. She reports nearly 1.5 weeks of abdominal pain that moves around.    Described as epigastric pain that radiated into the left upper quadrant and sometimes RLQ and sometimes right flank area. It was associated with hiccups and regurgitation of acid. She states the pain was different than any other type of pain she had experienced in the past. Described as a sharp stabbing pain, lasting few seconds, resolves on it's own. Occurs spontaneously throughout day and night.  She's not had any fevers, chills, change in bowel habits, nausea or vomiting.  Gastroenterology specialist: Dr. Arther Dames Urology specialist: [facility] Radiation Oncology: Dr. Noreene Filbert  Has had recent abdominal imaging through Urology on 07/19/15 with no kidney stones.   As patient is leaving room at the end of the visit she states to nursing "my left nipple fell off by the way". No pain, no discharge. Has history of breast cancer and is followed by Oncology team.   Past Medical History  Diagnosis Date  . GERD (gastroesophageal reflux disease)   . Vertigo   . Essential hypertension, benign   . Paroxysmal supraventricular tachycardia ([place])   . Pure hypercholesterolemia   . MS (multiple sclerosis) ([place])   . Fibromyalgia   . Diverticulosis   .  Diabetes mellitus without complication ([place])   . Osteoarthritis   . Dyslipidemia   . SOB (shortness of breath)     SECONDARY TO REFLUX  . Chest pain     SECONDARY TO GERD  . Peripheral neuropathy (HCC)     MILD  . Cancer ([place])     breast  . Depression   . Chronic interstitial cystitis   . History of fractured rib   . Chronic right hip pain   . History of TIA (transient ischemic attack)   . Carpal tunnel syndrome   . Thyroid nodule   . Hearing loss of left ear   . Vitamin B 12 deficiency   . Protein malnutrition ([place])   . Frequent falls   . Anxiety and depression   . Irritable bowel   . Recurrent UTI     Patient Active Problem List   Diagnosis Date Noted  . Microscopic hematuria 07/12/2015  . Suprapubic pain, acute 05/08/2015  . Diarrhea 05/02/2015  . Urinary tract infectious disease 05/02/2015  . Interstitial cystitis 04/25/2015  . Boil, face 04/22/2015  . Need for immunization against influenza 04/15/2015  . Recurrent UTI 04/09/2015  . Dysuria 04/01/2015  . Anxiety and depression 01/12/2014  . Carpal tunnel syndrome 01/12/2014  . DD (diverticular disease) 01/12/2014  . Carcinoma in situ, breast, ductal 01/12/2014  . H/O surgical procedure 01/12/2014  . H/O right mastectomy 01/12/2014  . Hypercholesteremia 01/12/2014  . DS (disseminated sclerosis) ([place]) 01/12/2014  . Arthritis, degenerative  01/12/2014  . Thyroid nodule 01/12/2014  . Cyanocobalamine deficiency (non anemic) 01/12/2014  . DM (diabetes mellitus) type II controlled, neurological manifestation ([place]) 06/26/2010  . Depression, major, recurrent, in partial remission ([place]) 06/26/2010  . Frequent falls 06/26/2010  . Stricture and stenosis of esophagus 06/26/2010  . Gastro-esophageal reflux disease without esophagitis 06/26/2010  . Fibromyalgia 06/26/2010  . Chronic interstitial cystitis 06/26/2010  . Acid reflux 06/26/2010  . H/O malignant neoplasm of breast 06/26/2010  . Hypertension goal BP (blood  pressure) < 150/90 06/01/2009  . H/O paroxysmal supraventricular tachycardia 06/01/2009  . Paroxysmal supraventricular tachycardia ([place]) 06/01/2009  . Irritable bowel syndrome with constipation and diarrhea 06/16/2008  . Diarrhea, functional 06/16/2008  . D (diarrhea) 06/16/2008    Social History  Substance Use Topics  . Smoking status: Never Smoker   . Smokeless tobacco: Never Used  . Alcohol Use: No     Current outpatient prescriptions:  .  acetaminophen (TYLENOL) 500 MG tablet, Take 500 mg by mouth every 6 (six) hours as needed., Disp: , Rfl:  .  acetic acid-aluminum acetate (DOMEBORO OTIC) 2 % otic solution, every 3 (three) hours., Disp: , Rfl:  .  Cholecalciferol (VITAMIN D-1000 MAX ST) 1000 UNITS tablet, Take by mouth., Disp: , Rfl:  .  cyanocobalamin ([employer] VITAMIN B12) 2000 MCG tablet, Take by mouth., Disp: , Rfl:  .  dicyclomine (BENTYL) 10 MG capsule, TAKE 1 CAPSULE (10 MG TOTAL) BY MOUTH EVERY 8 (EIGHT) HOURS AS NEEDED., Disp: , Rfl: 1 .  doxycycline (VIBRA-TABS) 100 MG tablet, Take 1 tablet (100 mg total) by mouth 2 (two) times daily., Disp: 7 tablet, Rfl: 0 .  ferrous fumarate (HEMOCYTE - 106 MG FE) 325 (106 FE) MG TABS tablet, Take 1 tablet by mouth. Reported on 07/27/2015, Disp: , Rfl:  .  ferrous sulfate 325 (65 FE) MG tablet, Take by mouth., Disp: , Rfl:  .  glucose blood test strip, 1 each by Other route as needed for other. Use as instructed, Disp: , Rfl:  .  meclizine (ANTIVERT) 25 MG tablet, TAKE ONE TO TWO TABLETS BY MOUTH EVERY 8 HOURS AS NEEDED, Disp: 40 tablet, Rfl: 1 .  metFORMIN (GLUCOPHAGE) 500 MG tablet, Take by mouth., Disp: , Rfl:  .  Meth-Hyo-M Bl-Na Phos-Ph Sal (URIBEL) 118 MG CAPS, One tablet four times daily as needed with 8 oz of water, Disp: 25 capsule, Rfl: 3 .  omeprazole (PRILOSEC) 20 MG capsule, Take 20 mg by mouth daily., Disp: , Rfl:  .  oxyCODONE-acetaminophen (PERCOCET) 7.5-325 MG per tablet, Take 1 tablet by mouth every 4 (four) hours as  needed for severe pain., Disp: , Rfl:  .  Probiotic Product (PROBIOTIC COLON SUPPORT) CAPS, Take by mouth., Disp: , Rfl:  .  rosuvastatin (CRESTOR) 5 MG tablet, Take 2.5 mg by mouth. , Disp: , Rfl:  .  sertraline (ZOLOFT) 50 MG tablet, TAKE ONE TABLET BY MOUTH ONE TIME DAILY, Disp: 90 tablet, Rfl: 1  Past Surgical History  Procedure Laterality Date  . Total abdominal hysterectomy w/ bilateral salpingoophorectomy    . Mastectomy Right     DX MASTITIS/NO CANCER.Marland KitchenSALINE IMPLANT  . Cholecystectomy    . Hemorrhoid surgery      WITH RECONSTRUCTION  . Bladder tacking      X 3  . Eye surgeries      WITH BUCKLE DETACHMENT OF THE RETINA  . Tonsillectomy    . Appendectomy    . Tympanoplasty    . Breast surgery    .  Mastectomy    . Breast lumpectomy    . Colonoscopy with propofol N/A 01/21/2015    Procedure: COLONOSCOPY WITH PROPOFOL;  Surgeon: Josefine Class, MD;  Location: [facility] ENDOSCOPY;  Service: Endoscopy;  Laterality: N/A;  . Esophagogastroduodenoscopy N/A 01/21/2015    Procedure: ESOPHAGOGASTRODUODENOSCOPY (EGD);  Surgeon: Josefine Class, MD;  Location: [facility] ENDOSCOPY;  Service: Endoscopy;  Laterality: N/A;    Family History  Problem Relation Age of Onset  . Arthritis Sister   . Aneurysm Brother   . Heart disease Daughter   . ALS Mother   . Colon cancer    . Kidney disease Neg Hx     Allergies  Allergen Reactions  . Biaxin [Clarithromycin] Other (See Comments) and Diarrhea  . Copaxone [Glatiramer Acetate]   . Decadron [Dexamethasone] Nausea And Vomiting and Other (See Comments)    Other reaction(s): Muscle Pain Reaction:  Abdominal pain  . Dexamethasone Sodium Phosphate Other (See Comments)  . Diltiazem Hcl Other (See Comments)    Reaction:  Unknown   . Diltiazem Hcl Other (See Comments)  . Flagyl [Metronidazole] Nausea Only and Diarrhea    Other reaction(s): Vomiting  . Gabapentin Other (See Comments)    "burning all over" Reaction:  Unknown   . Iohexol  Swelling and Other (See Comments)     Desc: tongue swelling with "IVP dye" sccording to nurses notes with a lumbar myelo-12/09- asm, Onset Date: [id number]  Pts tongue swells.  Clementeen Hoof [Iodinated Diagnostic Agents] Swelling and Other (See Comments)    Passed out  Pts tongue swells.   . Levothyroxine Nausea Only  . Sulfonamide Derivatives Other (See Comments)    Reaction:  Unknown   . Synthroid [Levothyroxine Sodium]   . Copaxone  [Glatiramer] Rash  . Interferon Beta-1a Other (See Comments) and Rash    Reaction:  Unknown      Review of Systems  CONSTITUTIONAL: No significant weight changes, fever, chills, weakness or fatigue.  SKIN: No rash or itching.  CARDIOVASCULAR: No chest pain, chest pressure or chest discomfort. No palpitations or edema.  RESPIRATORY: No shortness of breath, cough or sputum.  GASTROINTESTINAL: No anorexia, nausea, vomiting. No changes in bowel habits. Yes abdominal pain.  GENITOURINARY: No dysuria today. Chronic frequency. No discharge.  NEUROLOGICAL: No headache, dizziness, syncope, paralysis, ataxia, numbness or tingling in the extremities. No memory changes. No change in bowel or bladder control.  MUSCULOSKELETAL: Chronic joint pain. No muscle pain. HEMATOLOGIC: No anemia, bleeding or bruising.  LYMPHATICS: No enlarged lymph nodes.  PSYCHIATRIC: No change in mood. No change in sleep pattern.  ENDOCRINOLOGIC: No reports of sweating, cold or heat intolerance. No polyuria or polydipsia.     Objective  BP 148/76 mmHg  Pulse 83  Temp(Src) 98.3 F (36.8 C) (Oral)  Resp 12  Wt 157 lb 11.2 oz (71.532 kg)  SpO2 96% Body mass index is 30.8 kg/(m^2).  Physical Exam  Constitutional: Patient appears well-developed and well-nourished. In no distress.  Walks with use of seated walker.  Neck: Normal range of motion. Neck supple. No JVD present. No thyromegaly present.  Cardiovascular: Normal rate, regular rhythm and normal heart sounds.  No murmur heard.   Pulmonary/Chest: Effort normal and breath sounds normal. No respiratory distress. Abdomen: Soft, no fluid thrill, no HSM, normal bowel sounds in all 4 quadrants, patient reports pain with palpation in epigastric area but then flinches with general palpation exam however when distracted she is not in pain to palpation.  Left breast: Visual inspection  of left breast, no nipple inversion/discharge/skin-changes.  Peripheral vascular: Bilateral LE no edema.  Psychiatric: Patient has a stable mood and affect. Behavior is normal in office today. Judgment and thought content normal in office today.     Assessment & Plan  1. Generalized abdominal pain Etiologies considered chronic opioid induced constipation complicated by IBS, diverticular dz, gastritis with or without ulcer. Pancreatitis less likely. Will start with blood work and increase PPI from 20 mg to 40 mg with modified bland liquid diet. If no improvement and lab work at baseline encouraged patient to f/u with GI specialist.  - CBC with Differential/Platelet - Comprehensive metabolic panel - Amylase - Lipase - omeprazole (PRILOSEC) 40 MG capsule; Take 1 capsule (40 mg total) by mouth daily.  Dispense: 90 capsule; Refill: 2  2. Irritable bowel syndrome with constipation and diarrhea See AP #1  3. Gastro-esophageal reflux disease without esophagitis See AP #1  4. Diverticulosis of intestine without bleeding, unspecified intestinal tract location See AP #1  5. H/O malignant neoplasm of breast I did not appreciate any significant changes to her left breat nipple, instructed patient to inspect area daily and if any skin changes to seek medical attention ASAP. Has routine f/u with Oncology team regarding h/o ductal carcinoma in situ of left breast s/p whole breast radiation. Seen Dr. Noreene Filbert 05/12/15 who's assessment and plan states:  "Present time 1 year out she continues to do well with no evidence of disease. I'm please were  overall progress. I've assured her she did not lose her nipple she just had some retraction which is commonly seen after radiation and surgery. I've asked to see her back in 1 year for follow-up. She continues aromatase inhibitor therapy as planned. Patient knows to call sooner with any concerns."

## 2015-08-03 ENCOUNTER — Telehealth: Payer: Self-pay | Admitting: Family Medicine

## 2015-08-03 ENCOUNTER — Ambulatory Visit: Payer: Commercial Managed Care - HMO | Admitting: Obstetrics and Gynecology

## 2015-08-03 ENCOUNTER — Other Ambulatory Visit: Payer: Self-pay | Admitting: Pharmacist

## 2015-08-03 ENCOUNTER — Emergency Department
Admission: EM | Admit: 2015-08-03 | Discharge: 2015-08-03 | Disposition: A | Discharge status: Home / Self Care | Payer: Commercial Managed Care - HMO | Attending: Emergency Medicine | Admitting: Emergency Medicine

## 2015-08-03 ENCOUNTER — Encounter: Payer: Self-pay | Admitting: Medical Oncology

## 2015-08-03 ENCOUNTER — Emergency Department: Payer: Commercial Managed Care - HMO

## 2015-08-03 DIAGNOSIS — Z79899 Other long term (current) drug therapy: Secondary | ICD-10-CM | POA: Diagnosis not present

## 2015-08-03 DIAGNOSIS — E1149 Type 2 diabetes mellitus with other diabetic neurological complication: Secondary | ICD-10-CM | POA: Insufficient documentation

## 2015-08-03 DIAGNOSIS — R1010 Upper abdominal pain, unspecified: Secondary | ICD-10-CM | POA: Diagnosis not present

## 2015-08-03 DIAGNOSIS — R197 Diarrhea, unspecified: Secondary | ICD-10-CM | POA: Insufficient documentation

## 2015-08-03 DIAGNOSIS — Z7984 Long term (current) use of oral hypoglycemic drugs: Secondary | ICD-10-CM | POA: Diagnosis not present

## 2015-08-03 DIAGNOSIS — I10 Essential (primary) hypertension: Secondary | ICD-10-CM | POA: Insufficient documentation

## 2015-08-03 DIAGNOSIS — Z792 Long term (current) use of antibiotics: Secondary | ICD-10-CM | POA: Insufficient documentation

## 2015-08-03 DIAGNOSIS — R1030 Lower abdominal pain, unspecified: Secondary | ICD-10-CM | POA: Insufficient documentation

## 2015-08-03 DIAGNOSIS — R109 Unspecified abdominal pain: Secondary | ICD-10-CM | POA: Diagnosis not present

## 2015-08-03 DIAGNOSIS — R1011 Right upper quadrant pain: Secondary | ICD-10-CM | POA: Diagnosis present

## 2015-08-03 LAB — CBC WITH DIFFERENTIAL/PLATELET
BASOS: 0 %
Basophils Absolute: 0 10*3/uL (ref 0.0–0.2)
EOS (ABSOLUTE): 0.1 10*3/uL (ref 0.0–0.4)
Eos: 1 %
HEMATOCRIT: 40.2 % (ref 34.0–46.6)
HEMOGLOBIN: 13.1 g/dL (ref 11.1–15.9)
IMMATURE GRANS (ABS): 0 10*3/uL (ref 0.0–0.1)
Immature Granulocytes: 0 %
LYMPHS: 20 %
Lymphocytes Absolute: 1.4 10*3/uL (ref 0.7–3.1)
MCH: 29.4 pg (ref 26.6–33.0)
MCHC: 32.6 g/dL (ref 31.5–35.7)
MCV: 90 fL (ref 79–97)
MONOCYTES: 8 %
Monocytes Absolute: 0.5 10*3/uL (ref 0.1–0.9)
NEUTROS ABS: 4.6 10*3/uL (ref 1.4–7.0)
Neutrophils: 71 %
Platelets: 211 10*3/uL (ref 150–379)
RBC: 4.45 x10E6/uL (ref 3.77–5.28)
RDW: 15.6 % — ABNORMAL HIGH (ref 12.3–15.4)
WBC: 6.6 10*3/uL (ref 3.4–10.8)

## 2015-08-03 LAB — COMPREHENSIVE METABOLIC PANEL
A/G RATIO: 1.8 (ref 1.1–2.5)
ALBUMIN: 4.2 g/dL (ref 3.5–4.7)
ALK PHOS: 47 U/L (ref 38–126)
ALT: 15 IU/L (ref 0–32)
ALT: 20 U/L (ref 14–54)
AST: 10 IU/L (ref 0–40)
AST: 11 U/L — AB (ref 15–41)
Albumin: 4.3 g/dL (ref 3.5–5.0)
Alkaline Phosphatase: 46 IU/L (ref 39–117)
Anion gap: 11 (ref 5–15)
BILIRUBIN TOTAL: 0.3 mg/dL (ref 0.0–1.2)
BUN / CREAT RATIO: 12 (ref 11–26)
BUN: 10 mg/dL (ref 8–27)
BUN: 13 mg/dL (ref 6–20)
CHLORIDE: 101 mmol/L (ref 96–106)
CHLORIDE: 103 mmol/L (ref 101–111)
CO2: 25 mmol/L (ref 18–29)
CO2: 26 mmol/L (ref 22–32)
CREATININE: 0.96 mg/dL (ref 0.44–1.00)
Calcium: 10 mg/dL (ref 8.7–10.3)
Calcium: 9.9 mg/dL (ref 8.9–10.3)
Creatinine, Ser: 0.86 mg/dL (ref 0.57–1.00)
GFR calc Af Amer: >60 mL/min (ref >60)
GFR calc non Af Amer: 63 mL/min/{1.73_m2} (ref >59)
GFR, EST AFRICAN AMERICAN: 72 mL/min/{1.73_m2} (ref >59)
GFR, EST NON AFRICAN AMERICAN: 53 mL/min — AB (ref >60)
Globulin, Total: 2.4 g/dL (ref 1.5–4.5)
Glucose, Bld: 142 mg/dL — ABNORMAL HIGH (ref 65–99)
Glucose: 132 mg/dL — ABNORMAL HIGH (ref 65–99)
POTASSIUM: 4.5 mmol/L (ref 3.5–5.2)
Potassium: 4.4 mmol/L (ref 3.5–5.1)
Sodium: 143 mmol/L (ref 134–144)
Sodium: 140 mmol/L (ref 135–145)
TOTAL PROTEIN: 6.6 g/dL (ref 6.0–8.5)
Total Bilirubin: 0.9 mg/dL (ref 0.3–1.2)
Total Protein: 7.4 g/dL (ref 6.5–8.1)

## 2015-08-03 LAB — URINALYSIS COMPLETE WITH MICROSCOPIC (ARMC ONLY)
Bacteria, UA: NONE SEEN
Bilirubin Urine: NEGATIVE
GLUCOSE, UA: NEGATIVE mg/dL
Ketones, ur: NEGATIVE mg/dL
LEUKOCYTES UA: NEGATIVE
Nitrite: NEGATIVE
Protein, ur: NEGATIVE mg/dL
Specific Gravity, Urine: 1.024 (ref 1.005–1.030)
pH: 5 (ref 5.0–8.0)

## 2015-08-03 LAB — CBC
HCT: 41.4 % (ref 35.0–47.0)
Hemoglobin: 13.7 g/dL (ref 12.0–16.0)
MCH: 29.5 pg (ref 26.0–34.0)
MCHC: 33.1 g/dL (ref 32.0–36.0)
MCV: 89.2 fL (ref 80.0–100.0)
PLATELETS: 181 10*3/uL (ref 150–440)
RBC: 4.64 MIL/uL (ref 3.80–5.20)
RDW: 15.8 % — AB (ref 11.5–14.5)
WBC: 5.4 10*3/uL (ref 3.6–11.0)

## 2015-08-03 LAB — LIPASE: Lipase: 172 U/L — ABNORMAL HIGH (ref 0–59)

## 2015-08-03 LAB — LIPASE, BLOOD: LIPASE: 19 U/L (ref 11–51)

## 2015-08-03 LAB — TROPONIN I

## 2015-08-03 LAB — AMYLASE: Amylase: 125 U/L — ABNORMAL HIGH (ref 31–124)

## 2015-08-03 MED ORDER — SODIUM CHLORIDE 0.9 % IV BOLUS (SEPSIS)
500.0000 mL | Freq: Once | INTRAVENOUS | Status: AC
  Administered 2015-08-03: 500 mL via INTRAVENOUS

## 2015-08-03 MED ORDER — OXYCODONE-ACETAMINOPHEN 5-325 MG PO TABS
2.0000 | ORAL_TABLET | Freq: Once | ORAL | Status: AC
  Administered 2015-08-03: 2 via ORAL
  Filled 2015-08-03: qty 2

## 2015-08-03 MED ORDER — ONDANSETRON 4 MG PO TBDP: 4.0000 mg | ORAL_TABLET | Freq: Four times a day (QID) | ORAL | Status: DC | PRN

## 2015-08-03 NOTE — Patient Outreach (Signed)
[name] is a 81 y.o. female referred to pharmacy for medication management related to polypharmacy. About to call again to schedule a co-visit with Nurse Care Manager Janci Minor with the patient, but note that patient is currently in the emergency department related to lower abdominal pain. Will call again to follow up on 08/05/15.  Harlow Asa, PharmD Clinical Pharmacist [facility] [phone]

## 2015-08-03 NOTE — ED Provider Notes (Signed)
[facility] Emergency Department Provider Note  REMINDER - THIS NOTE IS NOT A FINAL MEDICAL RECORD UNTIL IT IS SIGNED. UNTIL THEN, THE CONTENT BELOW MAY REFLECT INFORMATION FROM A DOCUMENTATION TEMPLATE, NOT THE ACTUAL PATIENT VISIT. ____________________________________________  Time seen: Approximately 6:01 PM  I have reviewed the triage vital signs and the nursing notes.   HISTORY  Chief Complaint Abdominal Pain    HPI [name] is a 81 y.o. female history of multiple sclerosis, diverticulitis, diabetes, chronic cystitis.  Patient presents today states that she is been having approximately one month of abdominal pain. She said that it's mostly located in the upper abdomen along the right side. She reports that she has seen her urologist as well as primary care doctor for this, no clear diagnosis. She reports that this morning she started having some vomiting and vomited once. This is resolved and she is been able to eat.  Denies any chest pain cough or trouble breathing. No new numbness or weakness. She reports a moderate and somewhat intermittent pain that comes and goes located mostly in the upper abdomen and right side occasionally radiating to the back.   Past Medical History  Diagnosis Date  . GERD (gastroesophageal reflux disease)   . Vertigo   . Essential hypertension, benign   . Paroxysmal supraventricular tachycardia ([place])   . Pure hypercholesterolemia   . MS (multiple sclerosis) ([place])   . Fibromyalgia   . Diverticulosis   . Diabetes mellitus without complication ([place])   . Osteoarthritis   . Dyslipidemia   . SOB (shortness of breath)     SECONDARY TO REFLUX  . Chest pain     SECONDARY TO GERD  . Peripheral neuropathy (HCC)     MILD  . Cancer ([place])     breast  . Depression   . Chronic interstitial cystitis   . History of fractured rib   . Chronic right hip pain   . History of TIA (transient ischemic attack)   . Carpal tunnel  syndrome   . Thyroid nodule   . Hearing loss of left ear   . Vitamin B 12 deficiency   . Protein malnutrition ([place])   . Frequent falls   . Anxiety and depression   . Irritable bowel   . Recurrent UTI     Patient Active Problem List   Diagnosis Date Noted  . Generalized abdominal pain 08/02/2015  . Microscopic hematuria 07/12/2015  . Suprapubic pain, acute 05/08/2015  . Diarrhea 05/02/2015  . Urinary tract infectious disease 05/02/2015  . Interstitial cystitis 04/25/2015  . Boil, face 04/22/2015  . Need for immunization against influenza 04/15/2015  . Recurrent UTI 04/09/2015  . Dysuria 04/01/2015  . Anxiety and depression 01/12/2014  . Carpal tunnel syndrome 01/12/2014  . DD (diverticular disease) 01/12/2014  . Carcinoma in situ, breast, ductal 01/12/2014  . H/O surgical procedure 01/12/2014  . H/O right mastectomy 01/12/2014  . Hypercholesteremia 01/12/2014  . DS (disseminated sclerosis) ([place]) 01/12/2014  . Arthritis, degenerative 01/12/2014  . Thyroid nodule 01/12/2014  . Cyanocobalamine deficiency (non anemic) 01/12/2014  . DM (diabetes mellitus) type II controlled, neurological manifestation ([place]) 06/26/2010  . Depression, major, recurrent, in partial remission ([place]) 06/26/2010  . Frequent falls 06/26/2010  . Stricture and stenosis of esophagus 06/26/2010  . Gastro-esophageal reflux disease without esophagitis 06/26/2010  . Fibromyalgia 06/26/2010  . Chronic interstitial cystitis 06/26/2010  . Acid reflux 06/26/2010  . H/O malignant neoplasm of breast 06/26/2010  . Hypertension goal  BP (blood pressure) < 140/90 06/01/2009  . H/O paroxysmal supraventricular tachycardia 06/01/2009  . Paroxysmal supraventricular tachycardia ([place]) 06/01/2009  . Irritable bowel syndrome with constipation and diarrhea 06/16/2008  . Diarrhea, functional 06/16/2008  . D (diarrhea) 06/16/2008    Past Surgical History  Procedure Laterality Date  . Total abdominal hysterectomy w/  bilateral salpingoophorectomy    . Mastectomy Right     DX MASTITIS/NO CANCER.Marland KitchenSALINE IMPLANT  . Cholecystectomy    . Hemorrhoid surgery      WITH RECONSTRUCTION  . Bladder tacking      X 3  . Eye surgeries      WITH BUCKLE DETACHMENT OF THE RETINA  . Tonsillectomy    . Appendectomy    . Tympanoplasty    . Breast surgery    . Mastectomy    . Breast lumpectomy    . Colonoscopy with propofol N/A 01/21/2015    Procedure: COLONOSCOPY WITH PROPOFOL;  Surgeon: Josefine Class, MD;  Location: [facility] ENDOSCOPY;  Service: Endoscopy;  Laterality: N/A;  . Esophagogastroduodenoscopy N/A 01/21/2015    Procedure: ESOPHAGOGASTRODUODENOSCOPY (EGD);  Surgeon: Josefine Class, MD;  Location: [facility] ENDOSCOPY;  Service: Endoscopy;  Laterality: N/A;    Current Outpatient Rx  Name  Route  Sig  Dispense  Refill  . acetaminophen (TYLENOL) 500 MG tablet   Oral   Take 500 mg by mouth every 6 (six) hours as needed.         Marland Kitchen acetic acid-aluminum acetate (DOMEBORO OTIC) 2 % otic solution      every 3 (three) hours.         . Cholecalciferol (VITAMIN D-1000 MAX ST) 1000 UNITS tablet   Oral   Take by mouth.         . cyanocobalamin ([employer] VITAMIN B12) 2000 MCG tablet   Oral   Take by mouth.         . dicyclomine (BENTYL) 10 MG capsule      TAKE 1 CAPSULE (10 MG TOTAL) BY MOUTH EVERY 8 (EIGHT) HOURS AS NEEDED.      1   . doxycycline (VIBRA-TABS) 100 MG tablet   Oral   Take 1 tablet (100 mg total) by mouth 2 (two) times daily.   7 tablet   0   . ferrous fumarate (HEMOCYTE - 106 MG FE) 325 (106 FE) MG TABS tablet   Oral   Take 1 tablet by mouth. Reported on 07/27/2015         . ferrous sulfate 325 (65 FE) MG tablet   Oral   Take by mouth.         Marland Kitchen glucose blood test strip   Other   1 each by Other route as needed for other. Use as instructed         . meclizine (ANTIVERT) 25 MG tablet      TAKE ONE TO TWO TABLETS BY MOUTH EVERY 8 HOURS AS NEEDED   40 tablet   1    . metFORMIN (GLUCOPHAGE) 500 MG tablet   Oral   Take by mouth.         . Meth-Hyo-M Bl-Na Phos-Ph Sal (URIBEL) 118 MG CAPS      One tablet four times daily as needed with 8 oz of water   25 capsule   3   . omeprazole (PRILOSEC) 40 MG capsule   Oral   Take 1 capsule (40 mg total) by mouth daily.   90 capsule   2   .  ondansetron (ZOFRAN ODT) 4 MG disintegrating tablet   Oral   Take 1 tablet (4 mg total) by mouth every 6 (six) hours as needed for nausea or vomiting.   20 tablet   0   . oxyCODONE-acetaminophen (PERCOCET) 7.5-325 MG per tablet   Oral   Take 1 tablet by mouth every 4 (four) hours as needed for severe pain.         . Probiotic Product (PROBIOTIC COLON SUPPORT) CAPS   Oral   Take by mouth.         . rosuvastatin (CRESTOR) 5 MG tablet   Oral   Take 2.5 mg by mouth.          . sertraline (ZOLOFT) 50 MG tablet      TAKE ONE TABLET BY MOUTH ONE TIME DAILY   90 tablet   1     Allergies Biaxin; Copaxone; Decadron; Dexamethasone sodium phosphate; Diltiazem hcl; Diltiazem hcl; Flagyl; Gabapentin; Iohexol; Ivp dye; Levothyroxine; Sulfonamide derivatives; Synthroid; Copaxone ; and Interferon beta-1a  Family History  Problem Relation Age of Onset  . Arthritis Sister   . Aneurysm Brother   . Heart disease Daughter   . ALS Mother   . Colon cancer    . Kidney disease Neg Hx     Social History Social History  Substance Use Topics  . Smoking status: Never Smoker   . Smokeless tobacco: Never Used  . Alcohol Use: No    Review of Systems Constitutional: No fever/chills Eyes: No visual changes. ENT: No sore throat. Cardiovascular: Denies chest pain. Respiratory: Denies shortness of breath. Gastrointestinal: See history of present illness. Off-and-on loose stools, no significant change her last few months.    No constipation. Genitourinary: Negative for dysuria. Musculoskeletal: Negative for back pain. Skin: Negative for rash. Neurological:  Negative for headaches, focal weakness or numbness.  Denies any recent antibiotic treatments.  10-point ROS otherwise negative.  ____________________________________________   PHYSICAL EXAM:  VITAL SIGNS: ED Triage Vitals  Enc Vitals Group     BP 08/03/15 1359 142/82 mmHg     Pulse Rate 08/03/15 1359 89     Resp 08/03/15 1359 18     Temp 08/03/15 1359 99.1 F (37.3 C)     Temp Source 08/03/15 1359 Oral     SpO2 08/03/15 1359 96 %     Weight 08/03/15 1359 157 lb (71.215 kg)     Height 08/03/15 1359 5\' 2"  (1.575 m)     Head Cir --      Peak Flow --      Pain Score 08/03/15 1400 8     Pain Loc --      Pain Edu? --      Excl. in [place]? --    Constitutional: Alert and oriented. Well appearing and in no acute distress. Conversant and very pleasant. Granddaughter and daughter with her. Eyes: Conjunctivae are normal. PERRL. EOMI. Head: Atraumatic. Nose: No congestion/rhinnorhea. Mouth/Throat: Mucous membranes are slightly dry.  Oropharynx non-erythematous. Neck: No stridor.   Cardiovascular: Normal rate, regular rhythm. Grossly normal heart sounds.  Good peripheral circulation. Respiratory: Normal respiratory effort.  No retractions. Lungs CTAB. Gastrointestinal: Soft with moderate tenderness along the right mid to lower abdomen without rebound or guarding . No distention. No abdominal bruits. No CVA tenderness. Musculoskeletal: No lower extremity tenderness nor edema.  No joint effusions. No lumbar back pain. Neurologic:  Normal speech and language. No gross focal neurologic deficits are appreciated. No gait instability. Skin:  Skin is  warm, dry and intact. No rash noted. Psychiatric: Mood and affect are normal. Speech and behavior are normal.  ____________________________________________   LABS (all labs ordered are listed, but only abnormal results are displayed)  Labs Reviewed  COMPREHENSIVE METABOLIC PANEL - Abnormal; Notable for the following:    Glucose, Bld 142 (*)     AST 11 (*)    GFR calc non Af Amer 53 (*)    All other components within normal limits  CBC - Abnormal; Notable for the following:    RDW 15.8 (*)    All other components within normal limits  URINALYSIS COMPLETEWITH MICROSCOPIC ([facility] ONLY) - Abnormal; Notable for the following:    Color, Urine YELLOW (*)    APPearance CLEAR (*)    Hgb urine dipstick 2+ (*)    Squamous Epithelial / LPF 0-5 (*)    All other components within normal limits  LIPASE, BLOOD  TROPONIN I   ____________________________________________  EKG  ED ECG REPORT I, Dorthie Santini, the attending physician, personally viewed and interpreted this ECG.  Date: 08/03/2015 EKG Time: 1900 Rate: 70 Rhythm: normal sinus rhythm QRS Axis: normal Intervals: normal ST/T Wave abnormalities: normal Conduction Disutrbances: none Narrative Interpretation: unremarkable  ____________________________________________  RADIOLOGY  CT Abdomen Pelvis Wo Contrast (Final result) Result time: 08/03/15 19:52:02   Final result by Rad Results In Interface (08/03/15 19:52:02)   Narrative:   CLINICAL DATA: Abdominal pain  EXAM: CT ABDOMEN AND PELVIS WITHOUT CONTRAST  TECHNIQUE: Multidetector CT imaging of the abdomen and pelvis was performed following the standard protocol without IV contrast.  COMPARISON: 11/12/2014  FINDINGS: Lung bases are free of acute infiltrate or sizable effusion. Postsurgical changes are noted in the left breast and a right breast implant noted as well.  The gallbladder has been surgically removed. The liver demonstrates a small cyst in the left lobe. The spleen also demonstrates a prominent cyst anteriorly which measures 3.7 cm in greatest dimension. The adrenal glands and pancreas are within normal limits. Kidneys are well visualized bilaterally and reveal some cortical calcifications although no definitive renal calculi are seen. No obstructive changes are noted. Cystic changes are noted  within the kidneys. A hyperdense cyst is noted in the left kidney. It is stable from a prior exam. The bladder is decompressed.  The appendix is not visualized consistent with a prior surgical history. Scattered diverticular change is noted without diverticulitis. The uterus has been surgically removed. No pelvic sidewall mass lesion is noted. No lymphadenopathy is noted. The osseous structures show degenerative change of the lumbar spine as well as postsurgical changes at L4-5.  IMPRESSION: Chronic changes without acute abnormality.    ____________________________________________   PROCEDURES  Procedure(s) performed: None  Critical Care performed: No  ____________________________________________   INITIAL IMPRESSION / ASSESSMENT AND PLAN / ED COURSE  Pertinent labs & imaging results that were available during my care of the patient were reviewed by me and considered in my medical decision making (see chart for details).  Patient presents for increasing abdominal pain for about 3 weeks to months. Primarily in the epigastric and right sided abdomen. No associated cardiopulmonary symptoms. No recent antibiotics were clear risk factor C. Difficile based on discussion with patient. She does have a history of diverticulitis, based on her previous history and age will obtain CT imaging to further evaluate for cause. CT is normal, which one is fairly reassuring. We'll continue to follow her clinically in the ER. She is status post appendectomy and cholecystectomy. ----------------------------------------- 7:41  PM on 08/03/2015 -----------------------------------------  Reexam, patient drinking oral contrast well without issue. States her pain is better and she is comfortable at this time. She is fully awake and alert, conversant with her granddaughter and family. Await CT.  ----------------------------------------- 10:00 PM on  08/03/2015 -----------------------------------------  CT scan labs are reassuring. Patient currently reports her pain and symptoms are much better. Abdomen is now soft nontender nondistended. A little unclear as to why she is having abdominal pain, but her labs and CT are very reassuring. Discussed with the patient her family, we'll discharge her home and she will continue to follow up with her primary care doctor. I see no indication for admission, need for IV antibiotics, or further testing in the Er at this time. Patient and family very agreeable. ____________________________________________   FINAL CLINICAL IMPRESSION(S) / ED DIAGNOSES  Final diagnoses:  Pain of upper abdomen      Delman Kitten, MD 08/03/15 2201

## 2015-08-03 NOTE — Telephone Encounter (Signed)
Call patient ASAP and let her know amylase and lipase slightly elevated suggesting possible mild pancreatitis.  This can not be treated on an outpatient basis, Go straight to ER.

## 2015-08-03 NOTE — Telephone Encounter (Signed)
Tried to contact this patient to inform her of Dr. Allie Dimmer note, but there was no answer. A message was left for this patient to give [place] a call back when she got the chance.

## 2015-08-03 NOTE — ED Notes (Signed)
Pt reports she went to see her pcp yesterday for lower abd pain, was told to come to er d/t abnormal labs.

## 2015-08-03 NOTE — Telephone Encounter (Signed)
Patient was informed and stated that she had to cancel her appt with Zara Council due to her vomiting. Patient was then strongly encouraged to go straight to the ER due to her sx worsening. Patient expressed verbal understanding and agreed to go today.

## 2015-08-03 NOTE — Discharge Instructions (Signed)
You have been seen in the Emergency Department (ED) for abdominal pain.  Your evaluation did not identify a clear cause of your symptoms but was generally reassuring. ° °Please follow up as instructed above regarding today’s emergent visit and the symptoms that are bothering you. ° °Return to the ED if your abdominal pain worsens or fails to improve, you develop bloody vomiting, bloody diarrhea, you are unable to tolerate fluids due to vomiting, fever greater than 101, or other symptoms that concern you. ° ° °Abdominal Pain, Adult °Many things can cause abdominal pain. Usually, abdominal pain is not caused by a disease and will improve without treatment. It can often be observed and treated at home. Your health care provider will do a physical exam and possibly order blood tests and X-rays to help determine the seriousness of your pain. However, in many cases, more time must pass before a clear cause of the pain can be found. Before that point, your health care provider may not know if you need more testing or further treatment. °HOME CARE INSTRUCTIONS °Monitor your abdominal pain for any changes. The following actions may help to alleviate any discomfort you are experiencing: °· Only take over-the-counter or prescription medicines as directed by your health care provider. °· Do not take laxatives unless directed to do so by your health care provider. °· Try a clear liquid diet (broth, tea, or water) as directed by your health care provider. Slowly move to a bland diet as tolerated. °SEEK MEDICAL CARE IF: °· You have unexplained abdominal pain. °· You have abdominal pain associated with nausea or diarrhea. °· You have pain when you urinate or have a bowel movement. °· You experience abdominal pain that wakes you in the night. °· You have abdominal pain that is worsened or improved by eating food. °· You have abdominal pain that is worsened with eating fatty foods. °· You have a fever. °SEEK IMMEDIATE MEDICAL CARE  IF: °· Your pain does not go away within 2 hours. °· You keep throwing up (vomiting). °· Your pain is felt only in portions of the abdomen, such as the right side or the left lower portion of the abdomen. °· You pass bloody or black tarry stools. °MAKE SURE YOU: °· Understand these instructions. °· Will watch your condition. °· Will get help right away if you are not doing well or get worse. °  °This information is not intended to replace advice given to you by your health care provider. Make sure you discuss any questions you have with your health care provider. °  °Document Released: 05/02/2005 Document Revised: 04/13/2015 Document Reviewed: 04/01/2013 °Elsevier Interactive Patient Education ©2016 [employer] ° °

## 2015-08-04 ENCOUNTER — Ambulatory Visit: Payer: Commercial Managed Care - HMO | Admitting: Physical Therapy

## 2015-08-05 ENCOUNTER — Telehealth: Payer: Self-pay | Admitting: Family Medicine

## 2015-08-05 ENCOUNTER — Other Ambulatory Visit: Payer: Self-pay | Admitting: Pharmacist

## 2015-08-05 NOTE — Telephone Encounter (Signed)
She may have misunderstood but there was no mention of needing pain medication for her abdominal discomfort as the CT scan was normal and her pancreas enzymes have come down. If she continues to have abdominal pain she needs to see her GI specialist for her IBS.

## 2015-08-05 NOTE — Telephone Encounter (Signed)
Patient went to [facility] ER on 08/04/15. She was given something for nausea but he forgot to give her the pain medication.

## 2015-08-05 NOTE — Telephone Encounter (Signed)
Patient informed to call her GI doctor

## 2015-08-05 NOTE — Patient Outreach (Signed)
[name] is a 81 y.o. female referred to pharmacy for medication management related to polypharmacy. Called to schedule a co-visit with Nurse Care Manager Janci Minor with the patient. Call attempt #2. Left a HIPAA compliant message on the patient's voicemail. If have not heard from patient by next week, will give her another call at that time.   Harlow Asa, PharmD Clinical Pharmacist [place] [facility] [phone]

## 2015-08-09 ENCOUNTER — Other Ambulatory Visit: Payer: Self-pay | Admitting: *Deleted

## 2015-08-09 ENCOUNTER — Telehealth: Payer: Self-pay | Admitting: Family Medicine

## 2015-08-09 NOTE — Telephone Encounter (Signed)
Pt states she is waiting on her CT results. Pt states someone called her last week but she did not get the call.

## 2015-08-09 NOTE — Patient Outreach (Signed)
RNCM was successful in reaching this pt. Appt made for Friday Jan 6 @ 1pm. RNCM made pt aware that pharmacist would be attending visit also.   Plan: RNCM will see pt in her home on Friday as planned   Sharone Almond RN, BSN  [facility] [phone])

## 2015-08-10 ENCOUNTER — Ambulatory Visit (INDEPENDENT_AMBULATORY_CARE_PROVIDER_SITE_OTHER): Payer: Commercial Managed Care - HMO | Admitting: Family Medicine

## 2015-08-10 ENCOUNTER — Encounter: Payer: Self-pay | Admitting: Family Medicine

## 2015-08-10 VITALS — BP 162/92 | HR 76 | Temp 98.3°F | Resp 16 | Ht 62.0 in | Wt 154.0 lb

## 2015-08-10 DIAGNOSIS — K219 Gastro-esophageal reflux disease without esophagitis: Secondary | ICD-10-CM | POA: Diagnosis not present

## 2015-08-10 DIAGNOSIS — R1031 Right lower quadrant pain: Secondary | ICD-10-CM

## 2015-08-10 MED ORDER — OXYCODONE-ACETAMINOPHEN 5-325 MG PO TABS: 1.0000 | ORAL_TABLET | Freq: Four times a day (QID) | ORAL | Status: DC | PRN

## 2015-08-10 NOTE — Progress Notes (Signed)
Name: [name]   MRN: [id number]    DOB: 16-Jul-1930   Date:08/10/2015       Progress Note  Subjective  Chief Complaint  Chief Complaint  Patient presents with  . Follow-up    ER  . Abdominal Pain    RLQ unchanged.  CT normal    HPI  Kaysa Roulhac is a 82 year old female with known history of MS, HTN, OA, Chronic pain, DM II w/o complication, Diverticular disorder, Interstitial Cystitis (followed by Urology), IBS constipation/diarrhea alternating as some of her medical issues.   She reports nearly 4 weeks of abdominal pain.   Described as RLQ pain now. (Previously epigastric that radiated into the left upper quadrant and sometimes RLQ and sometimes right flank area). It was associated with hiccups and regurgitation of acid. She states the pain was different than any other type of pain she had experienced in the past. Described as a sharp stabbing pain, lasting few seconds, resolves on it's own. Occurs spontaneously throughout day and night. She's not had any fevers, chills, change in bowel habits, nausea or vomiting.  Gastroenterology specialist: Dr. Arther Dames Urology specialist: [facility] Radiation Oncology: Dr. Noreene Filbert  Has had recent abdominal imaging through Urology on 07/19/15 with no kidney stones. Blood work with me on 08/02/15 suggested early mild pancreatitis and patient went to ER where pancreatic enzymes had normalized, other labs at baseline, no elevation in WBC count, CT abdomen no acute or abnormal findings.   She is requesting pain medication today.  Past Medical History  Diagnosis Date  . GERD (gastroesophageal reflux disease)   . Vertigo   . Essential hypertension, benign   . Paroxysmal supraventricular tachycardia ([place])   . Pure hypercholesterolemia   . MS (multiple sclerosis) ([place])   . Fibromyalgia   . Diverticulosis   . Diabetes mellitus without complication ([place])   . Osteoarthritis   . Dyslipidemia   . SOB (shortness of breath)      SECONDARY TO REFLUX  . Chest pain     SECONDARY TO GERD  . Peripheral neuropathy (HCC)     MILD  . Cancer ([place])     breast  . Depression   . Chronic interstitial cystitis   . History of fractured rib   . Chronic right hip pain   . History of TIA (transient ischemic attack)   . Carpal tunnel syndrome   . Thyroid nodule   . Hearing loss of left ear   . Vitamin B 12 deficiency   . Protein malnutrition ([place])   . Frequent falls   . Anxiety and depression   . Irritable bowel   . Recurrent UTI     Patient Active Problem List   Diagnosis Date Noted  . Generalized abdominal pain 08/02/2015  . Microscopic hematuria 07/12/2015  . Suprapubic pain, acute 05/08/2015  . Diarrhea 05/02/2015  . Urinary tract infectious disease 05/02/2015  . Interstitial cystitis 04/25/2015  . Boil, face 04/22/2015  . Need for immunization against influenza 04/15/2015  . Recurrent UTI 04/09/2015  . Dysuria 04/01/2015  . Anxiety and depression 01/12/2014  . Carpal tunnel syndrome 01/12/2014  . DD (diverticular disease) 01/12/2014  . Carcinoma in situ, breast, ductal 01/12/2014  . H/O surgical procedure 01/12/2014  . H/O right mastectomy 01/12/2014  . Hypercholesteremia 01/12/2014  . DS (disseminated sclerosis) ([place]) 01/12/2014  . Arthritis, degenerative 01/12/2014  . Thyroid nodule 01/12/2014  . Cyanocobalamine deficiency (non anemic) 01/12/2014  . DM (diabetes mellitus) type II  controlled, neurological manifestation ([place]) 06/26/2010  . Depression, major, recurrent, in partial remission ([place]) 06/26/2010  . Frequent falls 06/26/2010  . Stricture and stenosis of esophagus 06/26/2010  . Gastro-esophageal reflux disease without esophagitis 06/26/2010  . Fibromyalgia 06/26/2010  . Chronic interstitial cystitis 06/26/2010  . Acid reflux 06/26/2010  . H/O malignant neoplasm of breast 06/26/2010  . Hypertension goal BP (blood pressure) < 140/90 06/01/2009  . H/O paroxysmal supraventricular  tachycardia 06/01/2009  . Paroxysmal supraventricular tachycardia ([place]) 06/01/2009  . Irritable bowel syndrome with constipation and diarrhea 06/16/2008  . Diarrhea, functional 06/16/2008  . D (diarrhea) 06/16/2008    Social History  Substance Use Topics  . Smoking status: Never Smoker   . Smokeless tobacco: Never Used  . Alcohol Use: No     Current outpatient prescriptions:  .  acetaminophen (TYLENOL) 500 MG tablet, Take 500 mg by mouth every 6 (six) hours as needed., Disp: , Rfl:  .  acetic acid-aluminum acetate (DOMEBORO OTIC) 2 % otic solution, every 3 (three) hours., Disp: , Rfl:  .  Cholecalciferol (VITAMIN D-1000 MAX ST) 1000 UNITS tablet, Take by mouth., Disp: , Rfl:  .  cyanocobalamin ([employer] VITAMIN B12) 2000 MCG tablet, Take by mouth., Disp: , Rfl:  .  dicyclomine (BENTYL) 10 MG capsule, TAKE 1 CAPSULE (10 MG TOTAL) BY MOUTH EVERY 8 (EIGHT) HOURS AS NEEDED., Disp: , Rfl: 1 .  doxycycline (VIBRA-TABS) 100 MG tablet, Take 1 tablet (100 mg total) by mouth 2 (two) times daily., Disp: 7 tablet, Rfl: 0 .  ferrous fumarate (HEMOCYTE - 106 MG FE) 325 (106 FE) MG TABS tablet, Take 1 tablet by mouth. Reported on 07/27/2015, Disp: , Rfl:  .  ferrous sulfate 325 (65 FE) MG tablet, Take by mouth., Disp: , Rfl:  .  glucose blood test strip, 1 each by Other route as needed for other. Use as instructed, Disp: , Rfl:  .  meclizine (ANTIVERT) 25 MG tablet, TAKE ONE TO TWO TABLETS BY MOUTH EVERY 8 HOURS AS NEEDED, Disp: 40 tablet, Rfl: 1 .  metFORMIN (GLUCOPHAGE) 500 MG tablet, Take by mouth., Disp: , Rfl:  .  Meth-Hyo-M Bl-Na Phos-Ph Sal (URIBEL) 118 MG CAPS, One tablet four times daily as needed with 8 oz of water, Disp: 25 capsule, Rfl: 3 .  omeprazole (PRILOSEC) 40 MG capsule, Take 1 capsule (40 mg total) by mouth daily., Disp: 90 capsule, Rfl: 2 .  ondansetron (ZOFRAN ODT) 4 MG disintegrating tablet, Take 1 tablet (4 mg total) by mouth every 6 (six) hours as needed for nausea or vomiting.,  Disp: 20 tablet, Rfl: 0 .  oxyCODONE-acetaminophen (PERCOCET) 7.5-325 MG per tablet, Take 1 tablet by mouth every 4 (four) hours as needed for severe pain., Disp: , Rfl:  .  Probiotic Product (PROBIOTIC COLON SUPPORT) CAPS, Take by mouth., Disp: , Rfl:  .  rosuvastatin (CRESTOR) 5 MG tablet, Take 2.5 mg by mouth. , Disp: , Rfl:  .  sertraline (ZOLOFT) 50 MG tablet, TAKE ONE TABLET BY MOUTH ONE TIME DAILY, Disp: 90 tablet, Rfl: 1  Past Surgical History  Procedure Laterality Date  . Total abdominal hysterectomy w/ bilateral salpingoophorectomy    . Mastectomy Right     DX MASTITIS/NO CANCER.Marland KitchenSALINE IMPLANT  . Cholecystectomy    . Hemorrhoid surgery      WITH RECONSTRUCTION  . Bladder tacking      X 3  . Eye surgeries      WITH BUCKLE DETACHMENT OF THE RETINA  . Tonsillectomy    .  Appendectomy    . Tympanoplasty    . Breast surgery    . Mastectomy    . Breast lumpectomy    . Colonoscopy with propofol N/A 01/21/2015    Procedure: COLONOSCOPY WITH PROPOFOL;  Surgeon: Josefine Class, MD;  Location: [facility] ENDOSCOPY;  Service: Endoscopy;  Laterality: N/A;  . Esophagogastroduodenoscopy N/A 01/21/2015    Procedure: ESOPHAGOGASTRODUODENOSCOPY (EGD);  Surgeon: Josefine Class, MD;  Location[facility] ENDOSCOPY;  Service: Endoscopy;  Laterality: N/A;    Family History  Problem Relation Age of Onset  . Arthritis Sister   . Aneurysm Brother   . Heart disease Daughter   . ALS Mother   . Colon cancer    . Kidney disease Neg Hx     Allergies  Allergen Reactions  . Biaxin [Clarithromycin] Other (See Comments) and Diarrhea  . Copaxone [Glatiramer Acetate]   . Decadron [Dexamethasone] Nausea And Vomiting and Other (See Comments)    Other reaction(s): Muscle Pain Reaction:  Abdominal pain  . Dexamethasone Sodium Phosphate Other (See Comments)  . Diltiazem Hcl Other (See Comments)    Reaction:  Unknown   . Diltiazem Hcl Other (See Comments)  . Flagyl [Metronidazole] Nausea Only and  Diarrhea    Other reaction(s): Vomiting  . Gabapentin Other (See Comments)    "burning all over" Reaction:  Unknown   . Iohexol Swelling and Other (See Comments)     Desc: tongue swelling with "IVP dye" sccording to nurses notes with a lumbar myelo-12/09- asm, Onset Date: [id number]  Pts tongue swells.  Clementeen Hoof [Iodinated Diagnostic Agents] Swelling and Other (See Comments)    Passed out  Pts tongue swells.   . Levothyroxine Nausea Only  . Sulfonamide Derivatives Other (See Comments)    Reaction:  Unknown   . Synthroid [Levothyroxine Sodium]   . Copaxone  [Glatiramer] Rash  . Interferon Beta-1a Other (See Comments) and Rash    Reaction:  Unknown      Review of Systems  CONSTITUTIONAL: No significant weight changes, fever, chills, weakness or fatigue.   CARDIOVASCULAR: No chest pain, chest pressure or chest discomfort. No palpitations or edema.  RESPIRATORY: No shortness of breath, cough or sputum.  GASTROINTESTINAL: No anorexia, nausea, vomiting. No changes in bowel habits. Yes abdominal pain. GENITOURINARY: No dysuria. No frequency. No discharge. NEUROLOGICAL: No headache, dizziness, syncope, paralysis, ataxia, numbness or tingling in the extremities. No memory changes. No change in bowel or bladder control.  MUSCULOSKELETAL: Chronic joint pain. No muscle pain. HEMATOLOGIC: No anemia, bleeding or bruising.  LYMPHATICS: No enlarged lymph nodes.    Objective  BP 162/92 mmHg  Pulse 76  Temp(Src) 98.3 F (36.8 C) (Oral)  Resp 16  Ht _0  (1.575 m)  Wt 154 lb (69.854 kg)  BMI 28.16 kg/m2  SpO2 94% Body mass index is 28.16 kg/(m^2).  Physical Exam  Constitutional: Patient appears well-developed and well-nourished. In no distress. Using seated walker to ambulate with today. Cardiovascular: Normal rate, regular rhythm and normal heart sounds.  No murmur heard.  Pulmonary/Chest: Effort normal and breath sounds normal. No respiratory distress. Abdomen: Soft, no fluid  thrill, no HSM, normal bowel sounds in all 4 quadrants, patient reports pain with palpation in RLQ area but then flinches with general palpation exam however when distracted she is not in pain to palpation.  Skin: Skin is warm and dry. No rash noted. No erythema.  Psychiatric: Patient has a stable anxious mood and affect. Behavior is normal in office today. Judgment and  thought content normal in office today.   Recent Results (from the past 2160 hour(s))  Urinalysis, Complete     Status: Abnormal   Collection Time: 05/13/15 11:16 AM  Result Value Ref Range   Specific Gravity, UA 1.025 1.005 - 1.030   pH, UA 5.5 5.0 - 7.5   Color, UA Green (A) Yellow   Appearance Ur Clear Clear   Leukocytes, UA Trace (A) Negative   Protein, UA Negative Negative/Trace   Glucose, UA Negative Negative   Ketones, UA Trace (A) Negative   RBC, UA 1+ (A) Negative   Bilirubin, UA Negative Negative   Urobilinogen, Ur 0.2 0.2 - 1.0 mg/dL   Nitrite, UA Negative Negative   Microscopic Examination See below:   Microscopic Examination     Status: Abnormal   Collection Time: 05/13/15 11:16 AM  Result Value Ref Range   WBC, UA 0-5 0 -  5 /hpf   RBC, UA 3-10 (A) 0 -  2 /hpf   Epithelial Cells (non renal) 0-10 0 - 10 /hpf   Renal Epithel, UA 0-10 (A) None seen /hpf   Mucus, UA Present (A) Not Estab.   Bacteria, UA Few (A) None seen/Few  CULTURE, URINE COMPREHENSIVE     Status: Abnormal   Collection Time: 05/13/15  4:13 PM  Result Value Ref Range   Urine Culture, Comprehensive Final report (A)    Result 1 Klebsiella pneumoniae (A)     Comment: 10,000-25,000 colony forming units per mL   Result 2 Comment     Comment: Mixed urogenital flora 4,000 Colonies/mL    ANTIMICROBIAL SUSCEPTIBILITY Comment     Comment:       ** S = Susceptible; I = Intermediate; R = Resistant **                    P = Positive; N = Negative             MICS are expressed in micrograms per mL    Antibiotic                 RSLT#1     RSLT#2    RSLT#3    RSLT#4 Amoxicillin/Clavulanic Acid    S Ampicillin                     R Cefepime                       S Ceftriaxone                    S Cefuroxime                     I Cephalothin                    I Ciprofloxacin                  S Ertapenem                      S Gentamicin                     S Imipenem                       S Levofloxacin  S Nitrofurantoin                 S Piperacillin                   R Tetracycline                   R Tobramycin                     S Trimethoprim/Sulfa             S   Urinalysis, Complete     Status: Abnormal   Collection Time: 05/20/15 11:27 AM  Result Value Ref Range   Specific Gravity, UA 1.025 1.005 - 1.030   pH, UA 5.5 5.0 - 7.5   Color, UA Green (A) Yellow   Appearance Ur Cloudy (A) Clear   Leukocytes, UA Negative Negative   Protein, UA Trace (A) Negative/Trace   Glucose, UA Negative Negative   Ketones, UA Negative Negative   RBC, UA Trace (A) Negative   Bilirubin, UA Negative Negative   Urobilinogen, Ur 0.2 0.2 - 1.0 mg/dL   Nitrite, UA Negative Negative   Microscopic Examination See below:   Microscopic Examination     Status: Abnormal   Collection Time: 05/20/15 11:27 AM  Result Value Ref Range   WBC, UA 0-5 0 -  5 /hpf   RBC, UA 0-2 0 -  2 /hpf   Epithelial Cells (non renal) >10 (A) 0 - 10 /hpf   Renal Epithel, UA None seen None seen /hpf   Bacteria, UA Few (A) None seen/Few  Urinalysis, Complete     Status: Abnormal   Collection Time: 05/31/15 11:15 AM  Result Value Ref Range   Specific Gravity, UA 1.025 1.005 - 1.030   pH, UA 6.0 5.0 - 7.5   Color, UA Green (A) Yellow   Appearance Ur Clear Clear   Leukocytes, UA Trace (A) Negative   Protein, UA 1+ (A) Negative/Trace   Glucose, UA Negative Negative   Ketones, UA Negative Negative   RBC, UA Negative Negative   Bilirubin, UA Negative Negative   Urobilinogen, Ur 0.2 0.2 - 1.0 mg/dL   Nitrite, UA Negative Negative    Microscopic Examination See below:   Microscopic Examination     Status: Abnormal   Collection Time: 05/31/15 11:15 AM  Result Value Ref Range   WBC, UA 0-5 0 -  5 /hpf   RBC, UA None seen 0 -  2 /hpf   Epithelial Cells (non renal) >10 (A) 0 - 10 /hpf   Renal Epithel, UA None seen None seen /hpf   Mucus, UA Present (A) Not Estab.   Bacteria, UA Few (A) None seen/Few  Urinalysis, Complete     Status: Abnormal   Collection Time: 06/07/15  9:58 AM  Result Value Ref Range   Specific Gravity, UA >1.030 (H) 1.005 - 1.030   pH, UA 6.0 5.0 - 7.5   Color, UA Green (A) Yellow   Appearance Ur Clear Clear   Leukocytes, UA Negative Negative   Protein, UA 1+ (A) Negative/Trace   Glucose, UA Negative Negative   Ketones, UA Negative Negative   RBC, UA Trace (A) Negative   Bilirubin, UA Negative Negative   Urobilinogen, Ur 0.2 0.2 - 1.0 mg/dL   Nitrite, UA Negative Negative   Microscopic Examination See below:   Microscopic Examination     Status: Abnormal   Collection Time: 06/07/15  9:58  AM  Result Value Ref Range   WBC, UA None seen 0 -  5 /hpf   RBC, UA 0-2 0 -  2 /hpf   Epithelial Cells (non renal) >10 (A) 0 - 10 /hpf   Renal Epithel, UA None seen None seen /hpf   Mucus, UA Present (A) Not Estab.   Bacteria, UA Few (A) None seen/Few  Urinalysis, Complete     Status: Abnormal   Collection Time: 06/14/15 10:41 AM  Result Value Ref Range   Specific Gravity, UA 1.025 1.005 - 1.030   pH, UA 5.0 5.0 - 7.5   Color, UA Yellow Yellow   Appearance Ur Clear Clear   Leukocytes, UA Negative Negative   Protein, UA Negative Negative/Trace   Glucose, UA Negative Negative   Ketones, UA Negative Negative   RBC, UA 1+ (A) Negative   Bilirubin, UA Negative Negative   Urobilinogen, Ur 0.2 0.2 - 1.0 mg/dL   Nitrite, UA Negative Negative   Microscopic Examination See below:   Microscopic Examination     Status: Abnormal   Collection Time: 06/14/15 10:41 AM  Result Value Ref Range   WBC, UA 0-5 0 -   5 /hpf   RBC, UA 0-2 0 -  2 /hpf   Epithelial Cells (non renal) 0-10 0 - 10 /hpf   Casts Present (A) None seen /lpf   Cast Type Hyaline casts N/A   Crystals Present (A) N/A   Crystal Type Calcium Oxalate N/A   Mucus, UA Present (A) Not Estab.   Bacteria, UA None seen None seen/Few  Urinalysis, Complete     Status: Abnormal   Collection Time: 06/21/15 11:01 AM  Result Value Ref Range   WBC, UA 6-10 (A) 0 -  5 /hpf   RBC, UA None seen 0 -  2 /hpf  Microscopic Examination     Status: Abnormal (Preliminary result)   Collection Time: 06/21/15 11:01 AM  Result Value Ref Range   RBC, UA 6-10 (A) 0 -  2 /hpf   Epithelial Cells (non renal) None seen 0 - 10 /hpf   Renal Epithel, UA 0-10 None seen /hpf   Mucus, UA Present (A) Not Estab.   Bacteria, UA Few (A) None seen/Few  Urinalysis, Complete     Status: Abnormal   Collection Time: 06/28/15  9:58 AM  Result Value Ref Range   Specific Gravity, UA >1.030 (H) 1.005 - 1.030   pH, UA 5.0 5.0 - 7.5   Color, UA Yellow Yellow   Appearance Ur Clear Clear   Leukocytes, UA Trace (A) Negative   Protein, UA Trace (A) Negative/Trace   Glucose, UA Negative Negative   Ketones, UA Negative Negative   RBC, UA Trace (A) Negative   Bilirubin, UA Negative Negative   Urobilinogen, Ur 0.2 0.2 - 1.0 mg/dL   Nitrite, UA Negative Negative   Microscopic Examination See below:   Microscopic Examination     Status: Abnormal   Collection Time: 06/28/15  9:58 AM  Result Value Ref Range   WBC, UA 6-10 (A) 0 -  5 /hpf   RBC, UA None seen 0 -  2 /hpf   Epithelial Cells (non renal) 0-10 0 - 10 /hpf   Mucus, UA Present (A) Not Estab.   Bacteria, UA Few (A) None seen/Few  Urinalysis, Complete     Status: Abnormal   Collection Time: 07/05/15  1:38 PM  Result Value Ref Range   Specific Gravity, UA 1.025 1.005 - 1.030  pH, UA 5.0 5.0 - 7.5   Color, UA Yellow Yellow   Appearance Ur Clear Clear   Leukocytes, UA Negative Negative   Protein, UA Negative  Negative/Trace   Glucose, UA Negative Negative   Ketones, UA Negative Negative   RBC, UA Trace (A) Negative   Bilirubin, UA Negative Negative   Urobilinogen, Ur 0.2 0.2 - 1.0 mg/dL   Nitrite, UA Negative Negative   Microscopic Examination See below:   Microscopic Examination     Status: Abnormal   Collection Time: 07/05/15  1:38 PM  Result Value Ref Range   WBC, UA 0-5 0 -  5 /hpf   RBC, UA None seen 0 -  2 /hpf   Epithelial Cells (non renal) 0-10 0 - 10 /hpf   Mucus, UA Present (A) Not Estab.   Bacteria, UA None seen None seen/Few  Urinalysis, Complete     Status: Abnormal   Collection Time: 07/12/15  1:55 PM  Result Value Ref Range   Specific Gravity, UA >1.030 (H) 1.005 - 1.030   pH, UA 5.0 5.0 - 7.5   Color, UA Yellow Yellow   Appearance Ur Clear Clear   Leukocytes, UA 1+ (A) Negative   Protein, UA Negative Negative/Trace   Glucose, UA Negative Negative   Ketones, UA Negative Negative   RBC, UA Trace (A) Negative   Bilirubin, UA Negative Negative   Urobilinogen, Ur 0.2 0.2 - 1.0 mg/dL   Nitrite, UA Negative Negative   Microscopic Examination See below:   Microscopic Examination     Status: Abnormal   Collection Time: 07/12/15  1:55 PM  Result Value Ref Range   WBC, UA 11-30 (A) 0 -  5 /hpf   RBC, UA 3-10 (A) 0 -  2 /hpf   Epithelial Cells (non renal) >10 (H) 0 - 10 /hpf   Mucus, UA Present (A) Not Estab.   Bacteria, UA Many (A) None seen/Few  CULTURE, URINE COMPREHENSIVE     Status: None   Collection Time: 07/12/15  2:18 PM  Result Value Ref Range   Urine Culture, Comprehensive Final report    Result 1 Comment     Comment: Mixed urogenital flora 10,000-25,000 colony forming units per mL   Urinalysis, Complete     Status: Abnormal   Collection Time: 07/20/15 10:22 AM  Result Value Ref Range   Specific Gravity, UA >1.030 (H) 1.005 - 1.030   pH, UA 5.5 5.0 - 7.5   Color, UA Green (A) Yellow   Appearance Ur Clear Clear   Leukocytes, UA Negative Negative    Protein, UA Trace (A) Negative/Trace   Glucose, UA Negative Negative   Ketones, UA Negative Negative   RBC, UA Negative Negative   Bilirubin, UA Negative Negative   Urobilinogen, Ur 0.2 0.2 - 1.0 mg/dL   Nitrite, UA Negative Negative   Microscopic Examination See below:   Microscopic Examination     Status: Abnormal   Collection Time: 07/20/15 10:22 AM  Result Value Ref Range   WBC, UA 0-5 0 -  5 /hpf   RBC, UA 0-2 0 -  2 /hpf   Epithelial Cells (non renal) 0-10 0 - 10 /hpf   Renal Epithel, UA 0-10 (A) None seen /hpf   Mucus, UA Present (A) Not Estab.   Bacteria, UA Few (A) None seen/Few  Urinalysis, Complete     Status: Abnormal   Collection Time: 07/27/15 10:34 AM  Result Value Ref Range   Specific Gravity, UA 1.025  1.005 - 1.030   pH, UA 5.5 5.0 - 7.5   Color, UA Green (A) Yellow   Appearance Ur Clear Clear   Leukocytes, UA Negative Negative   Protein, UA Negative Negative/Trace   Glucose, UA Negative Negative   Ketones, UA Negative Negative   RBC, UA Trace (A) Negative   Bilirubin, UA Negative Negative   Urobilinogen, Ur 0.2 0.2 - 1.0 mg/dL   Nitrite, UA Negative Negative   Microscopic Examination See below:   Microscopic Examination     Status: Abnormal   Collection Time: 07/27/15 10:34 AM  Result Value Ref Range   WBC, UA 0-5 0 -  5 /hpf   RBC, UA 0-2 0 -  2 /hpf   Epithelial Cells (non renal) 0-10 0 - 10 /hpf   Renal Epithel, UA 0-10 (A) None seen /hpf   Mucus, UA Present (A) Not Estab.   Bacteria, UA Few (A) None seen/Few  CBC with Differential/Platelet     Status: Abnormal   Collection Time: 08/02/15 10:16 AM  Result Value Ref Range   WBC 6.6 3.4 - 10.8 x10E3/uL   RBC 4.45 3.77 - 5.28 x10E6/uL   Hemoglobin 13.1 11.1 - 15.9 g/dL   Hematocrit 40.2 34.0 - 46.6 %   MCV 90 79 - 97 fL   MCH 29.4 26.6 - 33.0 pg   MCHC 32.6 31.5 - 35.7 g/dL   RDW 15.6 (H) 12.3 - 15.4 %   Platelets 211 150 - 379 x10E3/uL   Neutrophils 71 %   Lymphs 20 %   Monocytes 8 %   Eos 1  %   Basos 0 %   Neutrophils Absolute 4.6 1.4 - 7.0 x10E3/uL   Lymphocytes Absolute 1.4 0.7 - 3.1 x10E3/uL   Monocytes Absolute 0.5 0.1 - 0.9 x10E3/uL   EOS (ABSOLUTE) 0.1 0.0 - 0.4 x10E3/uL   Basophils Absolute 0.0 0.0 - 0.2 x10E3/uL   Immature Granulocytes 0 %   Immature Grans (Abs) 0.0 0.0 - 0.1 x10E3/uL  Comprehensive metabolic panel     Status: Abnormal   Collection Time: 08/02/15 10:16 AM  Result Value Ref Range   Glucose 132 (H) 65 - 99 mg/dL   BUN 10 8 - 27 mg/dL   Creatinine, Ser 0.86 0.57 - 1.00 mg/dL   GFR calc non Af Amer 63 >59 mL/min/1.73   GFR calc Af Amer 72 >59 mL/min/1.73   BUN/Creatinine Ratio 12 11 - 26   Sodium 143 134 - 144 mmol/L   Potassium 4.5 3.5 - 5.2 mmol/L   Chloride 101 96 - 106 mmol/L   CO2 25 18 - 29 mmol/L   Calcium 10.0 8.7 - 10.3 mg/dL   Total Protein 6.6 6.0 - 8.5 g/dL   Albumin 4.2 3.5 - 4.7 g/dL   Globulin, Total 2.4 1.5 - 4.5 g/dL   Albumin/Globulin Ratio 1.8 1.1 - 2.5   Bilirubin Total 0.3 0.0 - 1.2 mg/dL   Alkaline Phosphatase 46 39 - 117 IU/L   AST 10 0 - 40 IU/L   ALT 15 0 - 32 IU/L  Amylase     Status: Abnormal   Collection Time: 08/02/15 10:16 AM  Result Value Ref Range   Amylase 125 (H) 31 - 124 U/L  Lipase     Status: Abnormal   Collection Time: 08/02/15 10:16 AM  Result Value Ref Range   Lipase 172 (H) 0 - 59 U/L  Lipase, blood     Status: None   Collection Time: 08/03/15  2:02  PM  Result Value Ref Range   Lipase 19 11 - 51 U/L  Comprehensive metabolic panel     Status: Abnormal   Collection Time: 08/03/15  2:02 PM  Result Value Ref Range   Sodium 140 135 - 145 mmol/L   Potassium 4.4 3.5 - 5.1 mmol/L   Chloride 103 101 - 111 mmol/L   CO2 26 22 - 32 mmol/L   Glucose, Bld 142 (H) 65 - 99 mg/dL   BUN 13 6 - 20 mg/dL   Creatinine, Ser 0.96 0.44 - 1.00 mg/dL   Calcium 9.9 8.9 - 10.3 mg/dL   Total Protein 7.4 6.5 - 8.1 g/dL   Albumin 4.3 3.5 - 5.0 g/dL   AST 11 (L) 15 - 41 U/L   ALT 20 14 - 54 U/L   Alkaline  Phosphatase 47 38 - 126 U/L   Total Bilirubin 0.9 0.3 - 1.2 mg/dL   GFR calc non Af Amer 53 (L) >60 mL/min   GFR calc Af Amer >60 >60 mL/min    Comment: (NOTE) The eGFR has been calculated using the CKD EPI equation. This calculation has not been validated in all clinical situations. eGFR's persistently <60 mL/min signify possible Chronic Kidney Disease.    Anion gap 11 5 - 15  CBC     Status: Abnormal   Collection Time: 08/03/15  2:02 PM  Result Value Ref Range   WBC 5.4 3.6 - 11.0 K/uL   RBC 4.64 3.80 - 5.20 MIL/uL   Hemoglobin 13.7 12.0 - 16.0 g/dL   HCT 41.4 35.0 - 47.0 %   MCV 89.2 80.0 - 100.0 fL   MCH 29.5 26.0 - 34.0 pg   MCHC 33.1 32.0 - 36.0 g/dL   RDW 15.8 (H) 11.5 - 14.5 %   Platelets 181 150 - 440 K/uL  Troponin I     Status: None   Collection Time: 08/03/15  2:02 PM  Result Value Ref Range   Troponin I <0.03 <0.031 ng/mL    Comment:        NO INDICATION OF MYOCARDIAL INJURY.   Urinalysis complete, with microscopic ([facility] only)     Status: Abnormal   Collection Time: 08/03/15  9:21 PM  Result Value Ref Range   Color, Urine YELLOW (A) YELLOW   APPearance CLEAR (A) CLEAR   Glucose, UA NEGATIVE NEGATIVE mg/dL   Bilirubin Urine NEGATIVE NEGATIVE   Ketones, ur NEGATIVE NEGATIVE mg/dL   Specific Gravity, Urine 1.024 1.005 - 1.030   Hgb urine dipstick 2+ (A) NEGATIVE   pH 5.0 5.0 - 8.0   Protein, ur NEGATIVE NEGATIVE mg/dL   Nitrite NEGATIVE NEGATIVE   Leukocytes, UA NEGATIVE NEGATIVE   RBC / HPF 0-5 0 - 5 RBC/hpf   WBC, UA 0-5 0 - 5 WBC/hpf   Bacteria, UA NONE SEEN NONE SEEN   Squamous Epithelial / LPF 0-5 (A) NONE SEEN   Mucous PRESENT    Hyaline Casts, UA PRESENT      Assessment & Plan  1. Right lower quadrant abdominal pain Independently reviewed lab work and imaging done in ER, etiology unclear. May be referred pain from right hip, pain medication refilled. Encouraged patient to f/u with GI specialist.   - oxyCODONE-acetaminophen (ROXICET) 5-325 MG  tablet; Take 1 tablet by mouth every 6 (six) hours as needed for severe pain.  Dispense: 30 tablet; Refill: 0 - Ambulatory referral to Gastroenterology  2. Gastro-esophageal reflux disease without esophagitis Continue PPI at higher dose, seems to  be helping with reflux and regurgitation symptoms.  - Ambulatory referral to Gastroenterology

## 2015-08-11 ENCOUNTER — Encounter: Payer: Commercial Managed Care - HMO | Admitting: Physical Therapy

## 2015-08-12 ENCOUNTER — Other Ambulatory Visit: Payer: Self-pay | Admitting: Pharmacist

## 2015-08-12 ENCOUNTER — Other Ambulatory Visit: Payer: Self-pay | Admitting: *Deleted
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
## 2015-08-12 NOTE — Patient Outreach (Signed)
[facility]) Care Management  [place]   08/12/2015  [name] 28-Jan-1930 [id number]  Subjective: [name] is a 82 y.o. female referred to pharmacy for medication management. Present to patient's home today for a co-visit with Nurse Care Manager Janci Minor. Patient's daughter and husband are also present for our visit.  Patient reports that she has had many falls over the past months. Reports that she is also forgetful, forgetting words and misplacing items. See Nurse Care Manager's note for further information.   Nurse Care Manager discusses with patient her ongoing right sided pain. Patient reports that on Monday she is going to gastroenterologist for follow-up about this pain.  Review with patient each of her medications including name, strength, administration and indication. Patient reports that she has been managing her medications in a weekly pillbox, but shows me that one day of this box is broken. Patient reports that she fills her own pillbox herself each week. Provide patient with a new pillbox. Note patient has [employer]. Reports that [facility] and [facility] for her medications. Patient keeps her pill bottles in a large box. Note that box has other expired medications, old antibiotics and tablets sitting in the bottom. Assist patient to discard medications that are expired or that she is no longer taking. Also assist patient to throw away miscellaneous tablets that have accumulated at the bottom of this box. Review patient's current weekly pillbox and find that patient has been putting her dicyclomine in on a scheduled basis each morning. Patient states that this was how she understood that she was to take it. However, patient also seems to confuse this medication with Uribel. While with the patient, call the office of her gastroenterologist [phone]) and speak with Amber. Amber confirms that the patient is  to be taking this medication only on an as needed basis, every 8 hours, for abdominal cramping, and states that they will review the patient's need for this medication at her visit on Monday. Review this with the patient. Also review the use of each dicyclomine and Uribel as needed. Label each of these bottles in marker with their indication.   Patient reports that she sees Urology once a week to receive a bladder treatment that helps to prevent her recurrent urinary tract infections and discomfort. Reports that she has been prescribed Uribel as needed for discomfort in between these treatments.  [name] reports that she has been given Percocet to use as needed for her current pain. Daughter expresses concern about her mother using Percocet due to risk of constipation and potential to make her dizzy. Discuss weighing these risks versus benefit of pain control. Note that patient also has been prescribed Miralax for constipation. Discuss using the Miralax and staying hydrated. Particularly as patient is a high fall risk, counsel patient about the importance of using particular caution to her balance and sedation when needing to use Percocet, meclizine or dicyclomine. Patient verbalizes understanding. Patient reports that she uses the Percocet maybe once a day for pain control. Reports that she uses the meclizine sometimes once or twice a week if having dizziness.  Note that the patient has not been taking rosuvastatin as she had thought that she had run out of this. Patient has four doses of rosuvastatin remaining. Call [place] with the patient for a refill. However, this medication is not currently eligible for refill as it is too soon until 08/23/15. Patient  and daughter state that they believe that she has lost the rest of the tablets. Speak with patient's daughter about calling the insurance to get this shipped as soon as is available. Also speak with her daughter about calling [facility], for the price of purchasing a couple of tablets of the rosuvastatin for the patient to last until she is eligible to get this filled through insurance. Patient's daughter states that she believes that they can afford to do this.  Discuss with patient whether she is willing to let her daughter help her to manage her medications. Patient is agreeable. Talk with patient about how filling the pillbox together and managing her medications with her daughter can help the relieve some of her stress about managing this and help to ensure that she is taking her medications properly. Review each medication with her daughter as well. Help patient to fill the new pillbox that I have given her. Help patient and daughter to set back into her medication storage box all of the medications that she is taking on a scheduled basis that go in her pillbox. Help patient and daughter to put all of the medications that she uses on an as needed basis into another clear box, which we label "as needed" and place on her breakfast table. Review again each of these "as needed" medications with [name] and her daughter so that they know when and for what reason she should take each. Label each bottle in marker with an indication of what each is for. While we are reviewing these, Merlene Morse also makes a list for the patient of these as needed medications and what each is for to go on top of the box. Patient and daughter verbalize understanding.  Objective:   Current Medications: Current Outpatient Prescriptions  Medication Sig Dispense Refill  . acetaminophen (TYLENOL) 500 MG tablet Take 500 mg by mouth every 6 (six) hours as needed.    Marland Kitchen acetic acid-aluminum acetate (DOMEBORO OTIC) 2 % otic solution every 3 (three) hours.    . Cholecalciferol (VITAMIN D-1000 MAX ST) 1000 UNITS tablet Take by mouth.    . cyanocobalamin ([facility] VITAMIN B12) 2000 MCG tablet Take by mouth.    . dicyclomine (BENTYL) 10 MG capsule TAKE 1 CAPSULE  (10 MG TOTAL) BY MOUTH EVERY 8 (EIGHT) HOURS AS NEEDED.  1  . doxycycline (VIBRA-TABS) 100 MG tablet Take 1 tablet (100 mg total) by mouth 2 (two) times daily. 7 tablet 0  . ferrous fumarate (HEMOCYTE - 106 MG FE) 325 (106 FE) MG TABS tablet Take 1 tablet by mouth. Reported on 07/27/2015    . ferrous sulfate 325 (65 FE) MG tablet Take by mouth.    Marland Kitchen glucose blood test strip 1 each by Other route as needed for other. Use as instructed    . meclizine (ANTIVERT) 25 MG tablet TAKE ONE TO TWO TABLETS BY MOUTH EVERY 8 HOURS AS NEEDED 40 tablet 1  . metFORMIN (GLUCOPHAGE) 500 MG tablet Take by mouth.    . Meth-Hyo-M Bl-Na Phos-Ph Sal (URIBEL) 118 MG CAPS One tablet four times daily as needed with 8 oz of water 25 capsule 3  . omeprazole (PRILOSEC) 40 MG capsule Take 1 capsule (40 mg total) by mouth daily. 90 capsule 2  . ondansetron (ZOFRAN ODT) 4 MG disintegrating tablet Take 1 tablet (4 mg total) by mouth every 6 (six) hours as needed for nausea or vomiting. 20 tablet 0  . oxyCODONE-acetaminophen (ROXICET) 5-325 MG tablet  Take 1 tablet by mouth every 6 (six) hours as needed for severe pain. 30 tablet 0  . Probiotic Product (PROBIOTIC COLON SUPPORT) CAPS Take by mouth.    . rosuvastatin (CRESTOR) 5 MG tablet Take 2.5 mg by mouth.     . sertraline (ZOLOFT) 50 MG tablet TAKE ONE TABLET BY MOUTH ONE TIME DAILY 90 tablet 1   No current facility-administered medications for this visit.    Functional Status: In your present state of health, do you have any difficulty performing the following activities: 04/15/2015 02/21/2015  Hearing? [name]  Vision? Y Y  Difficulty concentrating or making decisions? [name]  Walking or climbing stairs? Y Y  Dressing or bathing? N N  Doing errands, shopping? N N    Fall/Depression Screening: PHQ 2/9 Scores 05/12/2015 04/15/2015 02/21/2015  PHQ - 2 Score 0 0 1    Assessment:  1) Patient currently non-adherent to her medication regimen due to patient needing assistance with  managing her medication pillbox and refills. Patient and her daughter, [name], are agreeable to managing this together to improve adherence and safety.  2) Patient currently out of her rosuvastatin due to having lost some of this medication.  Drugs sorted by system:  Neurologic/Psychologic: meclizine, sertraline  Cardiovascular: rosuvastatin  Gastrointestinal: dicyclomine, omeprazole, Zofran, Miralax, [place]  Endocrine: metformin  Urinary: Uribel  Pain: acetaminophen, Percocet  Vitamins/Minerals: Vitamin D3, Vitamin B12, ferrous sulfate   Duplications in therapy: none noted Gaps in therapy: none noted Medications to avoid in the elderly:  . Dicyclomine: due to anticholinergic effects. Patient has been taking on a scheduled basis, counseled to only use on an as needed basis for stomach cramps. To be re-evaluated by patient's gastroenterologist at upcoming visit. . Meclizine: due to anticholinergic effects. Patient counseled to only use as needed for dizziness. Counseled to watch for sedation and lie down if tired when using. . Oxycodone: CNS impairment. Drug interactions: . Oxycodone + meclizine: CNS depressants may enhance the CNS depressant effect of other CNS depressants . Dicyclomine + meclizine: Anticholinergic Agents may enhance the adverse/toxic effect of other Anticholinergic Agents.  Plan:   1) Patient and daughter to fill the patient's pillbox together each week on Fridays.  2) Patient's daughter, [name], to call to initiate future shipment of rosuvastatin refill for the patient through [employer]. [name] to [facility] to [facility] enough medication to last patient until this prescription will be arrive to be paid for without insurance.  3) Will call to follow up with [name] and her daughter [name] on Friday, 08/19/15, at 1 pm to see if they have any questions as filling the patient's pillbox together.  Harlow Asa, PharmD Clinical  Pharmacist [place] [facility] [phone]

## 2015-08-15 ENCOUNTER — Encounter: Payer: Self-pay | Admitting: *Deleted

## 2015-08-16 ENCOUNTER — Ambulatory Visit (INDEPENDENT_AMBULATORY_CARE_PROVIDER_SITE_OTHER): Payer: Commercial Managed Care - HMO | Admitting: Obstetrics and Gynecology

## 2015-08-16 ENCOUNTER — Encounter: Payer: Self-pay | Admitting: Obstetrics and Gynecology

## 2015-08-16 VITALS — BP 181/96 | HR 71 | Resp 18 | Ht 62.0 in | Wt 154.8 lb

## 2015-08-16 DIAGNOSIS — N3090 Cystitis, unspecified without hematuria: Secondary | ICD-10-CM | POA: Diagnosis not present

## 2015-08-16 LAB — URINALYSIS, COMPLETE
BILIRUBIN UA: NEGATIVE
GLUCOSE, UA: NEGATIVE
KETONES UA: NEGATIVE
Nitrite, UA: NEGATIVE
SPEC GRAV UA: 1.025 (ref 1.005–1.030)
Urobilinogen, Ur: 0.2 mg/dL (ref 0.2–1.0)
pH, UA: 5 (ref 5.0–7.5)

## 2015-08-16 LAB — MICROSCOPIC EXAMINATION: RBC, UA: NONE SEEN /hpf (ref 0–?)

## 2015-08-16 MED ORDER — SODIUM BICARBONATE 8.4 % IV SOLN
11.0000 mL | Freq: Once | INTRAVENOUS | Status: AC
  Administered 2015-08-16: 11 mL

## 2015-08-16 NOTE — Progress Notes (Signed)
10:36 AM   [name] 1930-02-07 [id number]  Referring provider: Bobetta Lime, MD [address] [place], [place] [address]  Chief Complaint  Patient presents with  . Cystitis    HPI: Patient is an 82 year old white female with a history of IC who presents today for rescue installation.      Today, she is experiencing suprapubic burning. Her UA is unremarkable. She is not had any gross hematuria. She's not had any fevers, chills, nausea or vomiting.  PMH: Past Medical History  Diagnosis Date  . GERD (gastroesophageal reflux disease)   . Vertigo   . Essential hypertension, benign   . Paroxysmal supraventricular tachycardia ([place])   . Pure hypercholesterolemia   . MS (multiple sclerosis) ([place])   . Fibromyalgia   . Diverticulosis   . Diabetes mellitus without complication ([place])   . Osteoarthritis   . Dyslipidemia   . SOB (shortness of breath)     SECONDARY TO REFLUX  . Chest pain     SECONDARY TO GERD  . Peripheral neuropathy (HCC)     MILD  . Cancer ([place])     breast  . Depression   . Chronic interstitial cystitis   . History of fractured rib   . Chronic right hip pain   . History of TIA (transient ischemic attack)   . Carpal tunnel syndrome   . Thyroid nodule   . Hearing loss of left ear   . Vitamin B 12 deficiency   . Protein malnutrition ([place])   . Frequent falls   . Anxiety and depression   . Irritable bowel   . Recurrent UTI     Surgical History: Past Surgical History  Procedure Laterality Date  . Total abdominal hysterectomy w/ bilateral salpingoophorectomy    . Mastectomy Right     DX MASTITIS/NO CANCER.Marland KitchenSALINE IMPLANT  . Cholecystectomy    . Hemorrhoid surgery      WITH RECONSTRUCTION  . Bladder tacking      X 3  . Eye surgeries      WITH BUCKLE DETACHMENT OF THE RETINA  . Tonsillectomy    . Appendectomy    . Tympanoplasty    . Breast surgery    . Mastectomy    . Breast lumpectomy    . Colonoscopy with propofol N/A  01/21/2015    Procedure: COLONOSCOPY WITH PROPOFOL;  Surgeon: Josefine Class, MD;  Location: [facility] ENDOSCOPY;  Service: Endoscopy;  Laterality: N/A;  . Esophagogastroduodenoscopy N/A 01/21/2015    Procedure: ESOPHAGOGASTRODUODENOSCOPY (EGD);  Surgeon: Josefine Class, MD;  Location[facility] ENDOSCOPY;  Service: Endoscopy;  Laterality: N/A;    Home Medications:    Medication List       This list is accurate as of: 08/16/15 10:36 AM.  Always use your most recent med list.               acetaminophen 500 MG tablet  Commonly known as:  TYLENOL  Take 500 mg by mouth every 6 (six) hours as needed.     CRESTOR 5 MG tablet  Generic drug:  rosuvastatin  Take 2.5 mg by mouth.     [facility]S VITAMIN B12 2000 MCG tablet  Generic drug:  cyanocobalamin  Take by mouth.     dicyclomine 10 MG capsule  Commonly known as:  BENTYL  TAKE 1 CAPSULE (10 MG TOTAL) BY MOUTH EVERY 8 (EIGHT) HOURS AS NEEDED.     ferrous sulfate 325 (65 FE) MG tablet  Take by mouth.     glucose blood test strip  1 each by Other route as needed for other. Use as instructed     meclizine 25 MG tablet  Commonly known as:  ANTIVERT  TAKE ONE TO TWO TABLETS BY MOUTH EVERY 8 HOURS AS NEEDED     metFORMIN 500 MG tablet  Commonly known as:  GLUCOPHAGE  Take by mouth.     omeprazole 40 MG capsule  Commonly known as:  PRILOSEC  Take 1 capsule (40 mg total) by mouth daily.     ondansetron 4 MG disintegrating tablet  Commonly known as:  ZOFRAN ODT  Take 1 tablet (4 mg total) by mouth every 6 (six) hours as needed for nausea or vomiting.     oxyCODONE-acetaminophen 5-325 MG tablet  Commonly known as:  ROXICET  Take 1 tablet by mouth every 6 (six) hours as needed for severe pain.     polyethylene glycol packet  Commonly known as:  MIRALAX / GLYCOLAX  Take 17 g by mouth daily as needed.     PROBIOTIC COLON SUPPORT Caps  Take by mouth.     sertraline 50 MG tablet  Commonly known as:  ZOLOFT  TAKE ONE TABLET BY  MOUTH ONE TIME DAILY     URIBEL 118 MG Caps  One tablet four times daily as needed with 8 oz of water     VITAMIN D-1000 MAX ST 1000 units tablet  Generic drug:  Cholecalciferol  Take by mouth.        Allergies:  Allergies  Allergen Reactions  . Biaxin [Clarithromycin] Other (See Comments) and Diarrhea  . Copaxone [Glatiramer Acetate]   . Decadron [Dexamethasone] Nausea And Vomiting and Other (See Comments)    Other reaction(s): Muscle Pain Reaction:  Abdominal pain  . Dexamethasone Sodium Phosphate Other (See Comments)  . Diltiazem Hcl Other (See Comments)    Reaction:  Unknown   . Diltiazem Hcl Other (See Comments)  . Flagyl [Metronidazole] Nausea Only and Diarrhea    Other reaction(s): Vomiting  . Gabapentin Other (See Comments)    "burning all over" Reaction:  Unknown   . Iohexol Swelling and Other (See Comments)     Desc: tongue swelling with "IVP dye" sccording to nurses notes with a lumbar myelo-12/09- asm, Onset Date: [id number]  Pts tongue swells.  Clementeen Hoof [Iodinated Diagnostic Agents] Swelling and Other (See Comments)    Passed out  Pts tongue swells.   . Levothyroxine Nausea Only  . Sulfonamide Derivatives Other (See Comments)    Reaction:  Unknown   . Synthroid [Levothyroxine Sodium]   . Copaxone  [Glatiramer] Rash  . Interferon Beta-1a Other (See Comments) and Rash    Reaction:  Unknown     Family History: Family History  Problem Relation Age of Onset  . Arthritis Sister   . Aneurysm Brother   . Heart disease Daughter   . ALS Mother   . Colon cancer    . Kidney disease Neg Hx     Social History:  reports that she has never smoked. She has never used smokeless tobacco. She reports that she does not drink alcohol or use illicit drugs.  ROS:                                        Physical Exam: Blood pressure 156/88, pulse 69, height 5' (1.524 m), weight  157 lb 9.6 oz (71.487 kg). Constitutional: Well nourished. Alert  and oriented, No acute distress. HEENT: [place] AT, moist mucus membranes. Trachea midline, no masses. Cardiovascular: No clubbing, cyanosis, or edema. Respiratory: Normal respiratory effort, no increased work of breathing. GI: Abdomen is soft, non tender, non distended, no abdominal masses. Liver and spleen not palpable.  No hernias appreciated.  Stool sample for occult testing is not indicated.   GU: No CVA tenderness.  No bladder fullness or masses.   Skin: No rashes, bruises or suspicious lesions. Lymph: No cervical or inguinal adenopathy. Neurologic: Grossly intact, no focal deficits, moving all 4 extremities. Psychiatric: Normal mood and affect.   Laboratory Data: Lab Results  Component Value Date   WBC 5.4 08/03/2015   HGB 13.7 08/03/2015   HCT 41.4 08/03/2015   MCV 89.2 08/03/2015   PLT 181 08/03/2015   Lab Results  Component Value Date   CREATININE 0.96 08/03/2015   Lab Results  Component Value Date   HGBA1C 6.5 04/15/2015   Pertinent Imaging CLINICAL DATA: Microscopic hematuria  EXAM: RENAL / URINARY TRACT ULTRASOUND COMPLETE  COMPARISON: Abdominal and pelvic CT scan without contrast dated November 12, 2014.  FINDINGS: Right Kidney:  Length: 9.2 cm. Echogenicity within normal limits. No mass or hydronephrosis visualized.  Left Kidney:  Length: 10.0 cm. There are multiple renal parenchymal cysts on the left. The faintly calcified structure in the posterior aspect of the midpole seen on the previous study is not clearly evident on today's ultrasound. There is a midpole cyst measuring 1.5 x 1.6 x 1.8 cm. There is an upper pole cyst measuring 0.9 cm in diameter. There is a lower pole cyst measuring 1.1 cm in diameter. There is no hydronephrosis.  Bladder:  The urinary bladder is nondistended and poorly evaluated.  IMPRESSION: 1. There are simple appearing cysts in the left kidney. The radiodense structure in the midpole of the right kidney  demonstrated on the previous CT scan is not clearly evident on today's ultrasound. 2. The right kidney is unremarkable. 3. The urinary bladder is not well evaluated on today's study. 4. If there is persistent microscopic hematuria and the urinary bladder has been cleared, MRI of the kidneys would be a useful next imaging step in an effort to evaluate the known hyperdense midpole lesion on the left which is not evident on today's ultrasound.   Electronically Signed  By: David Martinique M.D.  On: 07/19/2015 14:29          Urinalysis: Results for orders placed or performed during the hospital encounter of 08/03/15  Lipase, blood  Result Value Ref Range   Lipase 19 11 - 51 U/L  Comprehensive metabolic panel  Result Value Ref Range   Sodium 140 135 - 145 mmol/L   Potassium 4.4 3.5 - 5.1 mmol/L   Chloride 103 101 - 111 mmol/L   CO2 26 22 - 32 mmol/L   Glucose, Bld 142 (H) 65 - 99 mg/dL   BUN 13 6 - 20 mg/dL   Creatinine, Ser 0.96 0.44 - 1.00 mg/dL   Calcium 9.9 8.9 - 10.3 mg/dL   Total Protein 7.4 6.5 - 8.1 g/dL   Albumin 4.3 3.5 - 5.0 g/dL   AST 11 (L) 15 - 41 U/L   ALT 20 14 - 54 U/L   Alkaline Phosphatase 47 38 - 126 U/L   Total Bilirubin 0.9 0.3 - 1.2 mg/dL   GFR calc non Af Amer 53 (L) >60 mL/min   GFR calc Af  Amer >60 >60 mL/min   Anion gap 11 5 - 15  CBC  Result Value Ref Range   WBC 5.4 3.6 - 11.0 K/uL   RBC 4.64 3.80 - 5.20 MIL/uL   Hemoglobin 13.7 12.0 - 16.0 g/dL   HCT 41.4 35.0 - 47.0 %   MCV 89.2 80.0 - 100.0 fL   MCH 29.5 26.0 - 34.0 pg   MCHC 33.1 32.0 - 36.0 g/dL   RDW 15.8 (H) 11.5 - 14.5 %   Platelets 181 150 - 440 K/uL  Urinalysis complete, with microscopic ([facility] only)  Result Value Ref Range   Color, Urine YELLOW (A) YELLOW   APPearance CLEAR (A) CLEAR   Glucose, UA NEGATIVE NEGATIVE mg/dL   Bilirubin Urine NEGATIVE NEGATIVE   Ketones, ur NEGATIVE NEGATIVE mg/dL   Specific Gravity, Urine 1.024 1.005 - 1.030   Hgb urine dipstick 2+ (A)  NEGATIVE   pH 5.0 5.0 - 8.0   Protein, ur NEGATIVE NEGATIVE mg/dL   Nitrite NEGATIVE NEGATIVE   Leukocytes, UA NEGATIVE NEGATIVE   RBC / HPF 0-5 0 - 5 RBC/hpf   WBC, UA 0-5 0 - 5 WBC/hpf   Bacteria, UA NONE SEEN NONE SEEN   Squamous Epithelial / LPF 0-5 (A) NONE SEEN   Mucous PRESENT    Hyaline Casts, UA PRESENT   Troponin I  Result Value Ref Range   Troponin I <0.03 <0.031 ng/mL   Assessment & Plan:    1. Interstitial cystitis:   Patient received her rescue solution today.  She will RTC in one week for her next installation.   - Urinalysis, Complete  2. Microscopic hematuria:   Patient continues to have intermittent microscopic hematuria.  She did undergo a cystoscopy in 05/2014 and no GU pathology was found.  Her RUS did not demonstrate any malignancies.  She does not have any microscopic hematuria on today's UA.     No Follow-up on file.  Herbert Moors, [facility] [address] [place], [place] [address] [phone]

## 2015-08-16 NOTE — Progress Notes (Signed)
Bladder Rescue Solution Instillation  Due to IC patient is present today for a Rescue Solution Treatment. Patient was cleaned and prepped in a sterile fashion with betadine and lidocaine 2% jelly was instilled into the urethra. A 14 FR catheter was inserted, urine return was noted 29ml, urine was yellow in color. Instilled a solution consisting of 60ml of Sodium Bicarb, 2 ml Lidocaine and 1 ml of Heparin. The catheter was then removed. Patient tolerated well, no complications were noted.   Performed by: Toniann Fail, LPN  Assisted by:  [facility] Hurter, [place]

## 2015-08-16 NOTE — Addendum Note (Signed)
Addended by: Bettye Boeck on: 08/16/2015 01:36 PM   Modules accepted: Orders

## 2015-08-18 ENCOUNTER — Encounter: Payer: Commercial Managed Care - HMO | Admitting: Physical Therapy

## 2015-08-19 ENCOUNTER — Other Ambulatory Visit: Payer: Self-pay | Admitting: Pharmacist

## 2015-08-19 ENCOUNTER — Ambulatory Visit: Payer: Self-pay | Admitting: Pharmacist

## 2015-08-19 NOTE — Patient Outreach (Signed)
Called to follow up with Mrs. Darras and her daughter [name] to see if they have any questions after filling the patient's pillbox together. Both Mrs. Olena Heckle and [name] reports that they filled the box together without any issues. Report that the patient still has her as needed medications separately in the clear box where we placed them. Mrs. Carlos reports that Mr. Ocejo has called all of the medications that she needed refills of into the pharmacy, including the rosuvastatin. [name] reports that they did not end up getting more [facility]atin [facility] the [facility] at this time. [name] reports that she has just left her parents' home, but will return this weekend. Ask [name] to follow up with [facility] to be sure that the rosuvastatin was also ordered and will be shipped.  Mrs. Olena Heckle and [name] report that they have no further questions for me at this time. Confirm that both have my phone number. Will close this pharmacy episode at this time.  Harlow Asa, PharmD Clinical Pharmacist [facility] [phone]

## 2015-08-22 DIAGNOSIS — R1030 Lower abdominal pain, unspecified: Secondary | ICD-10-CM | POA: Diagnosis not present

## 2015-08-22 DIAGNOSIS — R1031 Right lower quadrant pain: Secondary | ICD-10-CM | POA: Diagnosis not present

## 2015-08-22 DIAGNOSIS — K219 Gastro-esophageal reflux disease without esophagitis: Secondary | ICD-10-CM | POA: Diagnosis not present

## 2015-08-23 ENCOUNTER — Ambulatory Visit (INDEPENDENT_AMBULATORY_CARE_PROVIDER_SITE_OTHER): Payer: Commercial Managed Care - HMO | Admitting: Obstetrics and Gynecology

## 2015-08-23 ENCOUNTER — Encounter: Payer: Self-pay | Admitting: Obstetrics and Gynecology

## 2015-08-23 ENCOUNTER — Encounter: Payer: Commercial Managed Care - HMO | Admitting: Physical Therapy

## 2015-08-23 VITALS — BP 149/81 | HR 79 | Resp 16 | Ht 62.0 in | Wt 157.6 lb

## 2015-08-23 DIAGNOSIS — N3090 Cystitis, unspecified without hematuria: Secondary | ICD-10-CM

## 2015-08-23 LAB — URINALYSIS, COMPLETE
Bilirubin, UA: NEGATIVE
GLUCOSE, UA: NEGATIVE
LEUKOCYTES UA: NEGATIVE
NITRITE UA: NEGATIVE
Protein, UA: NEGATIVE
Specific Gravity, UA: 1.02 (ref 1.005–1.030)
UUROB: 0.2 mg/dL (ref 0.2–1.0)
pH, UA: 5 (ref 5.0–7.5)

## 2015-08-23 LAB — MICROSCOPIC EXAMINATION: RBC MICROSCOPIC, UA: NONE SEEN /HPF (ref 0–?)

## 2015-08-23 MED ORDER — SODIUM BICARBONATE 8.4 % IV SOLN
11.0000 mL | Freq: Once | INTRAVENOUS | Status: AC
  Administered 2015-08-23: 11 mL

## 2015-08-23 NOTE — Progress Notes (Signed)
Bladder Rescue Solution Instillation  Due to cystitis patient is present today for a Rescue Solution Treatment.  Patient was cleaned and prepped in a sterile fashion with betadine and lidocaine 2% jelly was instilled into the urethra.  A 14 FR catheter was inserted, urine return was noted 58ml, urine was yellow in color.  Instilled a solution consisting of 1ml of Sodium Bicarb, 2 ml Lidocaine and 1 ml of Heparin. The catheter was then removed. Patient tolerated well, no complications were noted.   Performed by: Golden Hurter, [place]

## 2015-08-23 NOTE — Progress Notes (Signed)
9:58 AM   [name] 1929/09/14 [id number]  Referring provider: Bobetta Lime, MD [address] [place], [place] [address]  Chief Complaint  Patient presents with  . Cystitis  . OTHER    Rescue Solution    HPI: Patient is an 82 year old white female with a history of IC who presents today for rescue installation.      Today, she is experiencing mild suprapubic burning. Her UA is unremarkable. She denies any gross hematuria or other changes in urinary symptoms. She's not had any fevers, chills, nausea or vomiting.  PMH: Past Medical History  Diagnosis Date  . GERD (gastroesophageal reflux disease)   . Vertigo   . Essential hypertension, benign   . Paroxysmal supraventricular tachycardia ([place])   . Pure hypercholesterolemia   . MS (multiple sclerosis) ([place])   . Fibromyalgia   . Diverticulosis   . Diabetes mellitus without complication ([place])   . Osteoarthritis   . Dyslipidemia   . SOB (shortness of breath)     SECONDARY TO REFLUX  . Chest pain     SECONDARY TO GERD  . Peripheral neuropathy (HCC)     MILD  . Cancer ([place])     breast  . Depression   . Chronic interstitial cystitis   . History of fractured rib   . Chronic right hip pain   . History of TIA (transient ischemic attack)   . Carpal tunnel syndrome   . Thyroid nodule   . Hearing loss of left ear   . Vitamin B 12 deficiency   . Protein malnutrition ([place])   . Frequent falls   . Anxiety and depression   . Irritable bowel   . Recurrent UTI     Surgical History: Past Surgical History  Procedure Laterality Date  . Total abdominal hysterectomy w/ bilateral salpingoophorectomy    . Mastectomy Right     DX MASTITIS/NO CANCER.Marland KitchenSALINE IMPLANT  . Cholecystectomy    . Hemorrhoid surgery      WITH RECONSTRUCTION  . Bladder tacking      X 3  . Eye surgeries      WITH BUCKLE DETACHMENT OF THE RETINA  . Tonsillectomy    . Appendectomy    . Tympanoplasty    . Breast surgery    .  Mastectomy    . Breast lumpectomy    . Colonoscopy with propofol N/A 01/21/2015    Procedure: COLONOSCOPY WITH PROPOFOL;  Surgeon: Josefine Class, MD;  Location: [facility] ENDOSCOPY;  Service: Endoscopy;  Laterality: N/A;  . Esophagogastroduodenoscopy N/A 01/21/2015    Procedure: ESOPHAGOGASTRODUODENOSCOPY (EGD);  Surgeon: Josefine Class, MD;  Location: [facility] ENDOSCOPY;  Service: Endoscopy;  Laterality: N/A;    Home Medications:    Medication List       This list is accurate as of: 08/23/15  9:58 AM.  Always use your most recent med list.               acetaminophen 500 MG tablet  Commonly known as:  TYLENOL  Take 500 mg by mouth every 6 (six) hours as needed.     CRESTOR 5 MG tablet  Generic drug:  rosuvastatin  Take 2.5 mg by mouth.     CVS VITAMIN B12 2000 MCG tablet  Generic drug:  cyanocobalamin  Take by mouth.     dicyclomine 10 MG capsule  Commonly known as:  BENTYL  TAKE 1 CAPSULE (10 MG TOTAL) BY MOUTH EVERY 8 (EIGHT) HOURS  AS NEEDED.     ferrous sulfate 325 (65 FE) MG tablet  Take by mouth.     glucose blood test strip  1 each by Other route as needed for other. Use as instructed     ibuprofen 200 MG tablet  Commonly known as:  ADVIL,MOTRIN  Take 200 mg by mouth every 6 (six) hours as needed.     meclizine 25 MG tablet  Commonly known as:  ANTIVERT  TAKE ONE TO TWO TABLETS BY MOUTH EVERY 8 HOURS AS NEEDED     metFORMIN 500 MG tablet  Commonly known as:  GLUCOPHAGE  Take by mouth.     omeprazole 40 MG capsule  Commonly known as:  PRILOSEC  Take 1 capsule (40 mg total) by mouth daily.     ondansetron 4 MG disintegrating tablet  Commonly known as:  ZOFRAN ODT  Take 1 tablet (4 mg total) by mouth every 6 (six) hours as needed for nausea or vomiting.     oxyCODONE-acetaminophen 5-325 MG tablet  Commonly known as:  ROXICET  Take 1 tablet by mouth every 6 (six) hours as needed for severe pain.     polyethylene glycol packet  Commonly known as:   MIRALAX / GLYCOLAX  Take 17 g by mouth daily as needed.     PROBIOTIC COLON SUPPORT Caps  Take by mouth.     sertraline 50 MG tablet  Commonly known as:  ZOLOFT  TAKE ONE TABLET BY MOUTH ONE TIME DAILY     URIBEL 118 MG Caps  One tablet four times daily as needed with 8 oz of water     VITAMIN D-1000 MAX ST 1000 units tablet  Generic drug:  Cholecalciferol  Take by mouth.        Allergies:  Allergies  Allergen Reactions  . Biaxin [Clarithromycin] Other (See Comments) and Diarrhea  . Copaxone [Glatiramer Acetate]   . Decadron [Dexamethasone] Nausea And Vomiting and Other (See Comments)    Other reaction(s): Muscle Pain Reaction:  Abdominal pain  . Dexamethasone Sodium Phosphate Other (See Comments)  . Diltiazem Hcl Other (See Comments)    Reaction:  Unknown   . Diltiazem Hcl Other (See Comments)  . Flagyl [Metronidazole] Nausea Only and Diarrhea    Other reaction(s): Vomiting  . Gabapentin Other (See Comments)    "burning all over" Reaction:  Unknown   . Iohexol Swelling and Other (See Comments)     Desc: tongue swelling with "IVP dye" sccording to nurses notes with a lumbar myelo-12/09- asm, Onset Date: [id number]  Pts tongue swells.  Clementeen Hoof [Iodinated Diagnostic Agents] Swelling and Other (See Comments)    Passed out  Pts tongue swells.   . Levothyroxine Nausea Only  . Sulfonamide Derivatives Other (See Comments)    Reaction:  Unknown   . Synthroid [Levothyroxine Sodium]   . Copaxone  [Glatiramer] Rash  . Interferon Beta-1a Other (See Comments) and Rash    Reaction:  Unknown     Family History: Family History  Problem Relation Age of Onset  . Arthritis Sister   . Aneurysm Brother   . Heart disease Daughter   . ALS Mother   . Colon cancer    . Kidney disease Neg Hx     Social History:  reports that she has never smoked. She has never used smokeless tobacco. She reports that she does not drink alcohol or use illicit drugs.  ROS: UROLOGY Frequent  Urination?: Yes Hard to postpone urination?: No Burning/pain  with urination?: Yes Get up at night to urinate?: No Leakage of urine?: No Urine stream starts and stops?: No Trouble starting stream?: No Do you have to strain to urinate?: No Blood in urine?: No Urinary tract infection?: No Sexually transmitted disease?: No Injury to kidneys or bladder?: No Painful intercourse?: No Weak stream?: No Currently pregnant?: No Vaginal bleeding?: No Last menstrual period?: n  Gastrointestinal Nausea?: No Vomiting?: No Indigestion/heartburn?: Yes Diarrhea?: Yes Constipation?: Yes  Constitutional Fever: No Night sweats?: No Weight loss?: No Fatigue?: Yes  Skin Skin rash/lesions?: No Itching?: No  Eyes Blurred vision?: Yes Double vision?: No  Ears/Nose/Throat Sore throat?: No Sinus problems?: No  Hematologic/Lymphatic Swollen glands?: No Easy bruising?: No  Cardiovascular Leg swelling?: No Chest pain?: No  Respiratory Cough?: No Shortness of breath?: No  Endocrine Excessive thirst?: No  Musculoskeletal Back pain?: Yes Joint pain?: Yes  Neurological Headaches?: Yes Dizziness?: Yes  Psychologic Depression?: Yes Anxiety?: Yes  Physical Exam: Blood pressure 156/88, pulse 69, height 5' (1.524 m), weight 157 lb 9.6 oz (71.487 kg). Constitutional: Well nourished. Alert and oriented, No acute distress. HEENT: [place] AT, moist mucus membranes. Trachea midline, no masses. Cardiovascular: No clubbing, cyanosis, or edema. Respiratory: Normal respiratory effort, no increased work of breathing. GI: Abdomen is soft, non tender, non distended, no abdominal masses. Liver and spleen not palpable.  No hernias appreciated.  Stool sample for occult testing is not indicated.   GU: No CVA tenderness.  No bladder fullness or masses.   Skin: No rashes, bruises or suspicious lesions. Lymph: No cervical or inguinal adenopathy. Neurologic: Grossly intact, no focal deficits, moving  all 4 extremities. Psychiatric: Normal mood and affect.   Laboratory Data: Lab Results  Component Value Date   WBC 5.4 08/03/2015   HGB 13.7 08/03/2015   HCT 41.4 08/03/2015   MCV 89.2 08/03/2015   PLT 181 08/03/2015   Lab Results  Component Value Date   CREATININE 0.96 08/03/2015   Lab Results  Component Value Date   HGBA1C 6.5 04/15/2015   Pertinent Imaging CLINICAL DATA: Microscopic hematuria  EXAM: RENAL / URINARY TRACT ULTRASOUND COMPLETE  COMPARISON: Abdominal and pelvic CT scan without contrast dated November 12, 2014.  FINDINGS: Right Kidney:  Length: 9.2 cm. Echogenicity within normal limits. No mass or hydronephrosis visualized.  Left Kidney:  Length: 10.0 cm. There are multiple renal parenchymal cysts on the left. The faintly calcified structure in the posterior aspect of the midpole seen on the previous study is not clearly evident on today's ultrasound. There is a midpole cyst measuring 1.5 x 1.6 x 1.8 cm. There is an upper pole cyst measuring 0.9 cm in diameter. There is a lower pole cyst measuring 1.1 cm in diameter. There is no hydronephrosis.  Bladder:  The urinary bladder is nondistended and poorly evaluated.  IMPRESSION: 1. There are simple appearing cysts in the left kidney. The radiodense structure in the midpole of the right kidney demonstrated on the previous CT scan is not clearly evident on today's ultrasound. 2. The right kidney is unremarkable. 3. The urinary bladder is not well evaluated on today's study. 4. If there is persistent microscopic hematuria and the urinary bladder has been cleared, MRI of the kidneys would be a useful next imaging step in an effort to evaluate the known hyperdense midpole lesion on the left which is not evident on today's ultrasound.   Electronically Signed  By: David Martinique M.D.  On: 07/19/2015 14:29  Urinalysis: Results for orders placed or performed in visit on  08/16/15  Microscopic Examination  Result Value Ref Range   WBC, UA 6-10 (A) 0 -  5 /hpf   RBC, UA None seen 0 -  2 /hpf   Epithelial Cells (non renal) 0-10 0 - 10 /hpf   Mucus, UA Present (A) Not Estab.   Bacteria, UA Few (A) None seen/Few  Urinalysis, Complete  Result Value Ref Range   Specific Gravity, UA 1.025 1.005 - 1.030   pH, UA 5.0 5.0 - 7.5   Color, UA Yellow Yellow   Appearance Ur Clear Clear   Leukocytes, UA Trace (A) Negative   Protein, UA Trace (A) Negative/Trace   Glucose, UA Negative Negative   Ketones, UA Negative Negative   RBC, UA Trace (A) Negative   Bilirubin, UA Negative Negative   Urobilinogen, Ur 0.2 0.2 - 1.0 mg/dL   Nitrite, UA Negative Negative   Microscopic Examination See below:     Assessment & Plan:    1. Interstitial cystitis:   Patient received her rescue solution today.  She will RTC in one week for her next installation.   - Urinalysis, Complete   No Follow-up on file.  Herbert Moors, [facility] [address] [place], [place] [address] [phone]

## 2015-08-25 ENCOUNTER — Encounter: Payer: Commercial Managed Care - HMO | Admitting: Physical Therapy

## 2015-08-30 ENCOUNTER — Encounter: Payer: Commercial Managed Care - HMO | Admitting: Physical Therapy

## 2015-08-30 ENCOUNTER — Ambulatory Visit: Payer: Commercial Managed Care - HMO | Admitting: Obstetrics and Gynecology

## 2015-08-31 DIAGNOSIS — H04123 Dry eye syndrome of bilateral lacrimal glands: Secondary | ICD-10-CM | POA: Diagnosis not present

## 2015-09-01 ENCOUNTER — Encounter: Payer: Commercial Managed Care - HMO | Admitting: Physical Therapy

## 2015-09-01 ENCOUNTER — Other Ambulatory Visit: Payer: Self-pay | Admitting: *Deleted

## 2015-09-01 NOTE — Patient Outreach (Signed)
RNCM made a call to f/u with pt and she was at the store. Pt requested RNCM call back later.  Plan: call pt back in a couple of hours.  Rutherford Limerick RN, BSN  [facility] [phone])

## 2015-09-01 NOTE — Patient Outreach (Signed)
RNCM called pt to reschedule f/u visit. Plan made with pt to visit 09/05/15 at 10am.  Pt stated she has had no falls in two weeks. Pt says she has an MD appt in am. Pt stated she had an Eye appt where the MD gave her bad news. Pt stated her eye nerve is bad. Pt stated she knew it was getting worse because last month her vision had been been really bad and her eye felt like it has sand in it all the time. The Md called it Optic neuritis and told her to wash eyes with baby shampoo. Pt stated she had been using walker when going out of the home and cane while in the home.    Plan: Will visit pt 09/05/15 @ 10am  Kimberlye Dilger RN, BSN  [facility] Care Management [phone])

## 2015-09-02 ENCOUNTER — Ambulatory Visit (INDEPENDENT_AMBULATORY_CARE_PROVIDER_SITE_OTHER): Payer: Commercial Managed Care - HMO | Admitting: Family Medicine

## 2015-09-02 ENCOUNTER — Encounter: Payer: Self-pay | Admitting: Family Medicine

## 2015-09-02 VITALS — BP 139/74 | HR 96 | Temp 98.5°F | Resp 16 | Ht 62.0 in | Wt 156.6 lb

## 2015-09-02 DIAGNOSIS — I10 Essential (primary) hypertension: Secondary | ICD-10-CM | POA: Diagnosis not present

## 2015-09-02 DIAGNOSIS — E7800 Pure hypercholesterolemia, unspecified: Secondary | ICD-10-CM | POA: Diagnosis not present

## 2015-09-02 DIAGNOSIS — K219 Gastro-esophageal reflux disease without esophagitis: Secondary | ICD-10-CM

## 2015-09-02 DIAGNOSIS — E1140 Type 2 diabetes mellitus with diabetic neuropathy, unspecified: Secondary | ICD-10-CM | POA: Diagnosis not present

## 2015-09-02 DIAGNOSIS — E1149 Type 2 diabetes mellitus with other diabetic neurological complication: Secondary | ICD-10-CM

## 2015-09-02 DIAGNOSIS — M2550 Pain in unspecified joint: Secondary | ICD-10-CM

## 2015-09-02 DIAGNOSIS — G8929 Other chronic pain: Secondary | ICD-10-CM | POA: Insufficient documentation

## 2015-09-02 DIAGNOSIS — F3341 Major depressive disorder, recurrent, in partial remission: Secondary | ICD-10-CM | POA: Diagnosis not present

## 2015-09-02 LAB — GLUCOSE, POCT (MANUAL RESULT ENTRY): POC Glucose: 163 mg/dl — AB (ref 70–99)

## 2015-09-02 LAB — POCT GLYCOSYLATED HEMOGLOBIN (HGB A1C): HEMOGLOBIN A1C: 7

## 2015-09-02 LAB — POCT UA - MICROALBUMIN: MICROALBUMIN (UR) POC: 100 mg/L

## 2015-09-02 MED ORDER — OMEPRAZOLE 40 MG PO CPDR: 40.0000 mg | DELAYED_RELEASE_CAPSULE | Freq: Every day | ORAL | Status: DC

## 2015-09-02 MED ORDER — ROSUVASTATIN CALCIUM 5 MG PO TABS: 2.5000 mg | ORAL_TABLET | Freq: Every day | ORAL | Status: DC

## 2015-09-02 MED ORDER — METFORMIN HCL 500 MG PO TABS: 500.0000 mg | ORAL_TABLET | Freq: Every day | ORAL | Status: DC

## 2015-09-02 MED ORDER — SERTRALINE HCL 50 MG PO TABS: 50.0000 mg | ORAL_TABLET | Freq: Every day | ORAL | Status: DC

## 2015-09-02 NOTE — Progress Notes (Signed)
Name: [name]   MRN: [id number]    DOB: 04/30/1930   Date:09/02/2015       Progress Note  Subjective  Chief Complaint  Chief Complaint  Patient presents with  . Medication Refill  . Diabetes  . Hyperlipidemia  . Gastroesophageal Reflux  . Depression    HPI  [name] is a 82 year old female with a complex medical history and ongoing issues with interstitial cystitis. In regards to her Diabetes she feels that her medical condition is well controlled. First diagnosed with DM Type II several years ago. Current diabetes medication regimen includes Metformin 500mg once a day . Patient is taking medications as instructed. Overall the patient feels that their blood glucose is well controlled although she is not checking blood glucose at home. No symptoms of hypoglycemia are reported today. Related symptoms include increased fatigue and peripheral neuropathy. Related medical diagnosis include HTN, HLD. The last retinal eye exam was last year, goes annually. Patient presents with hyperlipidemia as well. She was tested because co morbid conditions such as DM II, HTN. There is a family history of hyperlipidemia. There is not a family history of early ischemia heart disease.   Patient complains of depression with anxious moods, often worries about her health. She complains of depressed mood, fatigue and impaired memory. Onset was approximately several years ago, stable since that time. She denies current suicidal and homicidal plan or intent. Family history significant for alcoholism, anxiety, depression and substance abuse. Possible organic causes contributing are: medications, endocrine/metabolic, nutritional. Risk factors: previous episode of depression. Previous treatment includes Zoloft and individual therapy. She complains of the following side effects from the treatment: none.   Abdominal pain chronic, likely due to scar tissue it has been determined as extensive workup has been  negative.   Past Medical History  Diagnosis Date  . GERD (gastroesophageal reflux disease)   . Vertigo   . Essential hypertension, benign   . Paroxysmal supraventricular tachycardia (HCC)   . Pure hypercholesterolemia   . MS (multiple sclerosis) (HCC)   . Fibromyalgia   . Diverticulosis   . Diabetes mellitus without complication (HCC)   . Osteoarthritis   . Dyslipidemia   . SOB (shortness of breath)     SECONDARY TO REFLUX  . Chest pain     SECONDARY TO GERD  . Peripheral neuropathy (HCC)     MILD  . Cancer (HCC)     breast  . Depression   . Chronic interstitial cystitis   . History of fractured rib   . Chronic right hip pain   . History of TIA (transient ischemic attack)   . Carpal tunnel syndrome   . Thyroid nodule   . Hearing loss of left ear   . Vitamin B 12 deficiency   . Protein malnutrition (HCC)   . Frequent falls   . Anxiety and depression   . Irritable bowel   . Recurrent UTI     Patient Active Problem List   Diagnosis Date Noted  . Right lower quadrant abdominal pain 08/02/2015  . Microscopic hematuria 07/12/2015  . Suprapubic pain, acute 05/08/2015  . Diarrhea 05/02/2015  . Urinary tract infectious disease 05/02/2015  . Interstitial cystitis 04/25/2015  . Boil, face 04/22/2015  . Need for immunization against influenza 04/15/2015  . Recurrent UTI 04/09/2015  . Dysuria 04/01/2015  . Anxiety and depression 01/12/2014  . Carpal tunnel syndrome 01/12/2014  . DD (diverticular disease) 01/12/2014  . Carcinoma in situ, breast,   ductal 01/12/2014  . H/O surgical procedure 01/12/2014  . H/O right mastectomy 01/12/2014  . Hypercholesteremia 01/12/2014  . DS (disseminated sclerosis) (HCC) 01/12/2014  . Arthritis, degenerative 01/12/2014  . Thyroid nodule 01/12/2014  . Cyanocobalamine deficiency (non anemic) 01/12/2014  . DM (diabetes mellitus) type II controlled, neurological manifestation (HCC) 06/26/2010  . Depression, major, recurrent, in partial  remission (HCC) 06/26/2010  . Frequent falls 06/26/2010  . Stricture and stenosis of esophagus 06/26/2010  . Gastro-esophageal reflux disease without esophagitis 06/26/2010  . Fibromyalgia 06/26/2010  . Chronic interstitial cystitis 06/26/2010  . Acid reflux 06/26/2010  . H/O malignant neoplasm of breast 06/26/2010  . Hypertension goal BP (blood pressure) < 140/90 06/01/2009  . H/O paroxysmal supraventricular tachycardia 06/01/2009  . Paroxysmal supraventricular tachycardia (HCC) 06/01/2009  . Irritable bowel syndrome with constipation and diarrhea 06/16/2008  . Diarrhea, functional 06/16/2008  . D (diarrhea) 06/16/2008    Social History  Substance Use Topics  . Smoking status: Never Smoker   . Smokeless tobacco: Never Used  . Alcohol Use: No     Current outpatient prescriptions:  .  acetaminophen (TYLENOL) 500 MG tablet, Take 500 mg by mouth every 6 (six) hours as needed., Disp: , Rfl:  .  Cholecalciferol (VITAMIN D-1000 MAX ST) 1000 UNITS tablet, Take by mouth., Disp: , Rfl:  .  cyanocobalamin ([employer] VITAMIN B12) 2000 MCG tablet, Take by mouth., Disp: , Rfl:  .  dicyclomine (BENTYL) 10 MG capsule, TAKE 1 CAPSULE (10 MG TOTAL) BY MOUTH EVERY 8 (EIGHT) HOURS AS NEEDED., Disp: , Rfl: 1 .  ferrous sulfate 325 (65 FE) MG tablet, Take by mouth., Disp: , Rfl:  .  glucose blood test strip, 1 each by Other route as needed for other. Use as instructed, Disp: , Rfl:  .  ibuprofen (ADVIL,MOTRIN) 200 MG tablet, Take 200 mg by mouth every 6 (six) hours as needed., Disp: , Rfl:  .  meclizine (ANTIVERT) 25 MG tablet, TAKE ONE TO TWO TABLETS BY MOUTH EVERY 8 HOURS AS NEEDED, Disp: 40 tablet, Rfl: 1 .  metFORMIN (GLUCOPHAGE) 500 MG tablet, Take by mouth., Disp: , Rfl:  .  Meth-Hyo-M Bl-Na Phos-Ph Sal (URIBEL) 118 MG CAPS, One tablet four times daily as needed with 8 oz of water, Disp: 25 capsule, Rfl: 3 .  omeprazole (PRILOSEC) 40 MG capsule, Take 1 capsule (40 mg total) by mouth daily., Disp: 90  capsule, Rfl: 2 .  ondansetron (ZOFRAN ODT) 4 MG disintegrating tablet, Take 1 tablet (4 mg total) by mouth every 6 (six) hours as needed for nausea or vomiting., Disp: 20 tablet, Rfl: 0 .  oxyCODONE-acetaminophen (ROXICET) 5-325 MG tablet, Take 1 tablet by mouth every 6 (six) hours as needed for severe pain., Disp: 30 tablet, Rfl: 0 .  polyethylene glycol (MIRALAX / GLYCOLAX) packet, Take 17 g by mouth daily as needed., Disp: , Rfl:  .  Probiotic Product (PROBIOTIC COLON SUPPORT) CAPS, Take by mouth., Disp: , Rfl:  .  rosuvastatin (CRESTOR) 5 MG tablet, Take 2.5 mg by mouth. , Disp: , Rfl:  .  sertraline (ZOLOFT) 50 MG tablet, TAKE ONE TABLET BY MOUTH ONE TIME DAILY, Disp: 90 tablet, Rfl: 1  Past Surgical History  Procedure Laterality Date  . Total abdominal hysterectomy w/ bilateral salpingoophorectomy    . Mastectomy Right     DX MASTITIS/NO CANCER..SALINE IMPLANT  . Cholecystectomy    . Hemorrhoid surgery      WITH RECONSTRUCTION  . Bladder tacking        X 3  . Eye surgeries      WITH BUCKLE DETACHMENT OF THE RETINA  . Tonsillectomy    . Appendectomy    . Tympanoplasty    . Breast surgery    . Mastectomy    . Breast lumpectomy    . Colonoscopy with propofol N/A 01/21/2015    Procedure: COLONOSCOPY WITH PROPOFOL;  Surgeon: Matthew Gordon Rein, MD;  Location: [facility] ENDOSCOPY;  Service: Endoscopy;  Laterality: N/A;  . Esophagogastroduodenoscopy N/A 01/21/2015    Procedure: ESOPHAGOGASTRODUODENOSCOPY (EGD);  Surgeon: Matthew Gordon Rein, MD;  Location: [facility] ENDOSCOPY;  Service: Endoscopy;  Laterality: N/A;    Family History  Problem Relation Age of Onset  . Arthritis Sister   . Aneurysm Brother   . Heart disease Daughter   . ALS Mother   . Colon cancer    . Kidney disease Neg Hx     Allergies  Allergen Reactions  . Biaxin [Clarithromycin] Other (See Comments) and Diarrhea  . Copaxone [Glatiramer Acetate]   . Decadron [Dexamethasone] Nausea And Vomiting and Other (See  Comments)    Other reaction(s): Muscle Pain Reaction:  Abdominal pain  . Dexamethasone Sodium Phosphate Other (See Comments)  . Diltiazem Hcl Other (See Comments)    Reaction:  Unknown   . Diltiazem Hcl Other (See Comments)  . Flagyl [Metronidazole] Nausea Only and Diarrhea    Other reaction(s): Vomiting  . Gabapentin Other (See Comments)    "burning all over" Reaction:  Unknown   . Iohexol Swelling and Other (See Comments)     Desc: tongue swelling with "IVP dye" sccording to nurses notes with a lumbar myelo-12/09- asm, Onset Date: [id number]  Pts tongue swells.  . Ivp Dye [Iodinated Diagnostic Agents] Swelling and Other (See Comments)    Passed out  Pts tongue swells.   . Levothyroxine Nausea Only  . Sulfonamide Derivatives Other (See Comments)    Reaction:  Unknown   . Synthroid [Levothyroxine Sodium]   . Copaxone  [Glatiramer] Rash  . Interferon Beta-1a Other (See Comments) and Rash    Reaction:  Unknown      Review of Systems  CONSTITUTIONAL: No significant weight changes, fever, chills, weakness or fatigue.  HEENT:  - Eyes: No visual changes.  - Ears: No auditory changes. No pain.  - Nose: No sneezing, congestion, runny nose. - Throat: No sore throat. No changes in swallowing. SKIN: No rash or itching.  CARDIOVASCULAR: No chest pain, chest pressure or chest discomfort. No palpitations or edema.  RESPIRATORY: No shortness of breath, cough or sputum.  GASTROINTESTINAL: No anorexia, nausea, vomiting. No changes in bowel habits. Yes intermittent pain. NEUROLOGICAL: No headache, dizziness, syncope, paralysis, ataxia, numbness or tingling in the extremities. No memory changes. No change in bowel or bladder control.  MUSCULOSKELETAL: Chronic joint pain. No muscle pain. HEMATOLOGIC: No anemia, bleeding or bruising.  LYMPHATICS: No enlarged lymph nodes.  PSYCHIATRIC: No change in mood. No change in sleep pattern.  ENDOCRINOLOGIC: No reports of sweating, cold or heat  intolerance. No polyuria or polydipsia.     Objective  BP 139/74 mmHg  Pulse 96  Temp(Src) 98.5 F (36.9 C) (Oral)  Resp 16  Ht 5' 2" (1.575 m)  Wt 156 lb 9.6 oz (71.033 kg)  BMI 28.64 kg/m2  SpO2 96% Body mass index is 28.64 kg/(m^2).  Physical Exam  Constitutional: Patient is well developed and well-nourished. In no distress. Walking with use of seated walker today. Cardiovascular: Normal rate, regular rhythm and normal heart sounds. No   murmur heard.  Pulmonary/Chest: Effort normal and breath sounds normal. No respiratory distress. Abdomen: Soft, non tender, bowel sounds normal in all quadrants. Musculoskeletal: Normal range of motion bilateral UE and LE, no joint effusions. Strength LE 4/5 bilaterally with general wasting of hip extensor flexor muscle tone. Strength UE bilaterally 5/5 however she does have pain in her neck and trapezius muscle group that limit the duration of overhead use. Peripheral vascular: Bilateral LE no edema. Neurological: CN II-XII grossly intact with no focal deficits. Alert and oriented to person, place, and time. Coordination, balance, strength, speech and gait are normal.  Skin: Skin is warm and dry. No rash noted. No erythema.  Psychiatric: Patient has a stable mood and affect. Behavior is normal in office today. Judgment and thought content normal in office today.   Results for orders placed or performed in visit on 09/02/15 (from the past 24 hour(s))  POCT Glucose (CBG)     Status: Normal   Collection Time: 09/02/15 10:19 AM  Result Value Ref Range   POC Glucose 163 (A) 70 - 99 mg/dl  POCT UA - Microalbumin     Status: Abnormal   Collection Time: 09/02/15 10:28 AM  Result Value Ref Range   Microalbumin Ur, POC 100 mg/L   Creatinine, POC  mg/dL   Albumin/Creatinine Ratio, Urine, POC    POCT HgB A1C     Status: Abnormal   Collection Time: 09/02/15 10:28 AM  Result Value Ref Range   Hemoglobin A1C 7.0      Assessment & Plan  1.  Controlled type 2 diabetes mellitus with diabetic neuropathy, without long-term current use of insulin (HCC) Well controlled for age.  Patient's Hba1c goal is <8% as this is reasonable for the elderly population who is at risk for hypoglycemic events.   Encouraged patient to continue efforts on checking blood glucose on a daily basis. Fasting blood glucose control goal is 80-130mg/dL and post prandial blood glucose control is <180mg/dL.  Reviewed diet, exercise, lifestyle changes and current medication regimen pertaining to diabetes with the patient.   Reminded patient of the required annual dilated retinal exam.     - POCT UA - Microalbumin - POCT HgB A1C - POCT Glucose (CBG) - metFORMIN (GLUCOPHAGE) 500 MG tablet; Take 1 tablet (500 mg total) by mouth daily with breakfast.  Dispense: 90 tablet; Refill: 2  2. Hypertension goal BP (blood pressure) < 140/90 Well controled with lifestyle changes.  3. Hypercholesteremia Refilled.  - rosuvastatin (CRESTOR) 5 MG tablet; Take 0.5 tablets (2.5 mg total) by mouth at bedtime.  Dispense: 90 tablet; Refill: 2  4. Gastro-esophageal reflux disease without esophagitis Refilled.  - omeprazole (PRILOSEC) 40 MG capsule; Take 1 capsule (40 mg total) by mouth daily.  Dispense: 90 capsule; Refill: 2  5. Chronic pain of multiple joints Increased risk of frequent falls. Continue conservative use of opioid medication. Tylenol otherwise.  6. Depression, major, recurrent, in partial remission (HCC) Stable.  - sertraline (ZOLOFT) 50 MG tablet; Take 1 tablet (50 mg total) by mouth daily.  Dispense: 90 tablet; Refill: 2   

## 2015-09-05 ENCOUNTER — Other Ambulatory Visit: Payer: Self-pay | Admitting: *Deleted

## 2015-09-05 ENCOUNTER — Telehealth: Payer: Self-pay | Admitting: Family Medicine

## 2015-09-05 VITALS — BP 132/78 | HR 70 | Wt 157.0 lb

## 2015-09-05 DIAGNOSIS — E1139 Type 2 diabetes mellitus with other diabetic ophthalmic complication: Secondary | ICD-10-CM

## 2015-09-05 NOTE — Patient Outreach (Signed)
[facility] Care Management   09/05/2015  [name] 26-Jun-1930 [id number]  [name] is an 82 y.o. female  Subjective: " The eye doctor gave me bad news saying there was nothing else she could do." "I have been taking my walker when I leave the house and I have not fallen since you were last here." "We are moving to [place] in a few months to be closer to my daughter who lives there."   Objective: Blood pressure 132/78, pulse 70, weight 157 lb (71.215 kg), SpO2 97 %.   Review of Systems  Eyes: Positive for blurred vision.  Gastrointestinal: Positive for abdominal pain.  Musculoskeletal: Positive for back pain.  Neurological:       Had a dizzy spell until was able to sit down said she was also nauseated at that time.   Psychiatric/Behavioral: Positive for memory loss.    Physical Exam  Constitutional: She is oriented to person, place, and time. She appears well-developed and well-nourished.  Cardiovascular: Normal rate, regular rhythm and normal heart sounds.   Pulses:      Radial pulses are 2+ on the right side, and 2+ on the left side.       Dorsalis pedis pulses are 1+ on the right side, and 1+ on the left side.  Respiratory: Effort normal and breath sounds normal.  GI: Soft. Bowel sounds are normal.  Neurological: She is alert and oriented to person, place, and time.  Skin: Skin is warm, dry and intact.  Psychiatric: She has a normal mood and affect. Her speech is normal and behavior is normal. Judgment and thought content normal. She exhibits abnormal recent memory.    Current Medications:   Current Outpatient Prescriptions  Medication Sig Dispense Refill  . acetaminophen (TYLENOL) 500 MG tablet Take 500 mg by mouth every 6 (six) hours as needed.    . Cholecalciferol (VITAMIN D-1000 MAX ST) 1000 UNITS tablet Take by mouth.    . cyanocobalamin ([employer]S VITAMIN B12) 2000 MCG tablet Take by mouth.    . dicyclomine (BENTYL) 10 MG capsule TAKE 1 CAPSULE (10  MG TOTAL) BY MOUTH EVERY 8 (EIGHT) HOURS AS NEEDED.  1  . ferrous sulfate 325 (65 FE) MG tablet Take by mouth.    Marland Kitchen glucose blood test strip 1 each by Other route as needed for other. Use as instructed    . ibuprofen (ADVIL,MOTRIN) 200 MG tablet Take 200 mg by mouth every 6 (six) hours as needed.    . meclizine (ANTIVERT) 25 MG tablet TAKE ONE TO TWO TABLETS BY MOUTH EVERY 8 HOURS AS NEEDED 40 tablet 1  . metFORMIN (GLUCOPHAGE) 500 MG tablet Take 1 tablet (500 mg total) by mouth daily with breakfast. 90 tablet 2  . Meth-Hyo-M Bl-Na Phos-Ph Sal (URIBEL) 118 MG CAPS One tablet four times daily as needed with 8 oz of water 25 capsule 3  . omeprazole (PRILOSEC) 40 MG capsule Take 1 capsule (40 mg total) by mouth daily. 90 capsule 2  . ondansetron (ZOFRAN ODT) 4 MG disintegrating tablet Take 1 tablet (4 mg total) by mouth every 6 (six) hours as needed for nausea or vomiting. 20 tablet 0  . oxyCODONE-acetaminophen (ROXICET) 5-325 MG tablet Take 1 tablet by mouth every 6 (six) hours as needed for severe pain. 30 tablet 0  . polyethylene glycol (MIRALAX / GLYCOLAX) packet Take 17 g by mouth daily as needed.    . Probiotic Product (PROBIOTIC COLON SUPPORT) CAPS Take by mouth.    Marland Kitchen  rosuvastatin (CRESTOR) 5 MG tablet Take 0.5 tablets (2.5 mg total) by mouth at bedtime. 90 tablet 2  . sertraline (ZOLOFT) 50 MG tablet Take 1 tablet (50 mg total) by mouth daily. 90 tablet 2   No current facility-administered medications for this visit.    Functional Status:   In your present state of health, do you have any difficulty performing the following activities: 08/12/2015 04/15/2015  Hearing? [name]  Vision? Y Y  Difficulty concentrating or making decisions? [name]  Walking or climbing stairs? Y Y  Dressing or bathing? Y N  Doing errands, shopping? Y N  Preparing Food and eating ? Y -  Using the Toilet? Y -  In the past six months, have you accidently leaked urine? Y -  Do you have problems with loss of bowel control?  Y -  Managing your Medications? N -  Managing your Finances? Y -  Housekeeping or managing your Housekeeping? N -    Fall/Depression Screening:    PHQ 2/9 Scores 08/12/2015 05/12/2015 04/15/2015 02/21/2015  PHQ - 2 Score 2 0 0 1  PHQ- 9 Score 12 - - -   Fall Risk  08/12/2015 07/13/2015 05/12/2015 04/15/2015 02/21/2015  Falls in the past year? Yes Yes No Yes Yes  Number falls in past yr: 2 or more 2 or more - 2 or more 2 or more  Injury with Fall? Yes - - Yes Yes  Risk Factor Category  High Fall Risk High Fall Risk - High Fall Risk High Fall Risk  Risk for fall due to : History of fall(s);Impaired balance/gait;Impaired mobility;Impaired vision;Medication side effect History of fall(s) Impaired balance/gait;Impaired mobility - -  Follow up Falls evaluation completed;Falls prevention discussed (No Data) - - Falls evaluation completed;Education provided;Falls prevention discussed    Assessment:  Pt received a new glucometer, RNCM educated pt on using the meter, pt demonstrated good technique, daughter present and listened to demonstration of new monitor. Cassandra at [facility] called and made aware of new meter name, Truetrack, and pt's need of new strips and lancets called in to [facility] mail order.   Pt reports decreased falls since started taking walker when going out and being more mindful to use cane inside her home. Falls teaching done with pt, spouse and daughter related to throw rugs and clutter in the home. Pt verbalized understanding. Pt and daughter concerned about increasing memory loss, resource for dementia specialist given to daughter. RNCM also encouraged pt and daughter to have life alert put in place related to pt's fall risk. Resource for this given to pt at last visit.   Pt reported having one weak dizzy spell while out shopping, reviewed the s/s of low blood sugar and pt stated she had not eaten lunch. Encouraged pt to keep peanut butter crackers in her car just incase she was out  without lunch. Also encouraged pt to keep candy in her purse. RNCM discussed with pt the transition to health coach and pt in agreement with this plan.   Plan: RNCM will send an order to health coach for more education on diabetes and fall prevention.  Sukari Grist RN, BSN  [facility] Care Management [phone])  [facility] CM Care Plan Problem One        Most Recent Value   Care Plan Problem One  Pt has had multiple falls and other mishaps concerning which could put her in danger.    Role Documenting the Problem One  Care Management Coordinator   Care  Plan for Problem One  Active   [facility] Long Term Goal (31-90 days)  Pt will be free from falls for the next 31 days.   [facility]N Long Term Goal Start Date  08/12/15   [facility]N Long Term Goal Met Date  09/05/15   Interventions for Problem One Long Term Goal  RNCM opened case, and made a home visit assessment.    [facility] CM Short Term Goal #1 (0-30 days)  Pt will use her cane during all activities inside her home and use her walker when going outside her home for the next 30 days.   [facility] CM Short Term Goal #1 Start Date  08/12/15   [facility] CM Short Term Goal #1 Met Date  09/05/15   Interventions for Short Term Goal #1  Educated pt on the importance of using an assistive device for ambulation to asssit with balance and decrease falls. Enlisted the assistance of spouse in reminding pt to use cane and walker. Reinforced cane usage several times during the visit.    [facility] CM Short Term Goal #2 (0-30 days)  Pt will obtain a glucometer for checking sugar daily within the next 14 days.   [facility] CM Short Term Goal #2 Start Date  08/12/15   [facility] CM Short Term Goal #2 Met Date  09/05/15   Interventions for Short Term Goal #2  RNCM assisted pt in looking for all the peices of her current glucometer. RNCM nor pharmacist could find a complete blood sugar meter. RNCM plans to make MD aware pt in need of a new meter.

## 2015-09-05 NOTE — Telephone Encounter (Signed)
Janci Minor from [facility] went to visit the patient and provided her with a new glucose meter. She is now needing a prescription for True Track strips and lancets. Patient uses [facility] mail order pharmacy. Patient only have enough to last for 5 days

## 2015-09-06 ENCOUNTER — Other Ambulatory Visit: Payer: Self-pay

## 2015-09-06 MED ORDER — FINGERSTIX LANCETS MISC: 1.0000 | Freq: Three times a day (TID) | Status: DC

## 2015-09-06 MED ORDER — GLUCOSE BLOOD VI STRP: ORAL_STRIP | Status: DC

## 2015-09-12 ENCOUNTER — Telehealth: Payer: Self-pay | Admitting: Family Medicine

## 2015-09-12 ENCOUNTER — Other Ambulatory Visit: Payer: Self-pay

## 2015-09-12 NOTE — Telephone Encounter (Signed)
Patient was told by pharmacy that it would be less expensive it she was to get a prescription from her pcp for testing strips, lancets and meter. The whole kit. Would like the one that is less expensive. Please send to [facility]

## 2015-09-14 MED ORDER — FERROUS SULFATE 325 (65 FE) MG PO TABS: 325.0000 mg | ORAL_TABLET | Freq: Every day | ORAL | Status: DC

## 2015-09-14 MED ORDER — GLUCOSE BLOOD VI STRP: 1.0000 | ORAL_STRIP | Status: DC | PRN

## 2015-09-16 ENCOUNTER — Other Ambulatory Visit: Payer: Self-pay | Admitting: *Deleted

## 2015-09-16 NOTE — Patient Outreach (Signed)
[place] [facility]) Care Management  09/16/2015  [name] 10-06-1929 [id number]   RN Health Coach  attempted initial introduction outreach call to patient.  Patient was unavailable. [employer] compliance voice mail message left with call back number.  [name], BSN, RN [facility] Care Management RN Health Coach Phone: [facility] complies with applicable Federal civil rights laws and does not discriminate on the basis of race, color, national origin, age, disability, or sex. Espaol (Spanish)  [place] cumple con las leyes federales de derechos civiles aplicables y no discrimina por motivos de raza, color, nacionalidad, edad, discapacidad o sexo.    [place] ([place]  [place] tun th? lu?t dn quy?n hi?n hnh c?a Lin bang v khng phn bi?t ?i x? d?a trn ch?ng t?c, mu da, ngu?n g?c qu?c gia, ? tu?i, khuy?t t?t, ho?c gi?i tnh.    (Arabic)    [place]                      .

## 2015-09-19 ENCOUNTER — Other Ambulatory Visit: Payer: Self-pay

## 2015-09-20 ENCOUNTER — Other Ambulatory Visit: Payer: Self-pay

## 2015-09-20 MED ORDER — GLUCOSE BLOOD VI STRP: ORAL_STRIP | Status: DC

## 2015-09-20 MED ORDER — TRUE METRIX AIR GLUCOSE METER DEVI: 1.0000 | Freq: Three times a day (TID) | Status: DC

## 2015-09-20 MED ORDER — FINGERSTIX LANCETS MISC: 1.0000 | Freq: Three times a day (TID) | Status: DC

## 2015-09-28 ENCOUNTER — Encounter: Payer: Self-pay | Admitting: Obstetrics and Gynecology

## 2015-09-28 ENCOUNTER — Ambulatory Visit (INDEPENDENT_AMBULATORY_CARE_PROVIDER_SITE_OTHER): Payer: Commercial Managed Care - HMO | Admitting: Obstetrics and Gynecology

## 2015-09-28 VITALS — BP 159/78 | HR 66 | Temp 97.6°F | Resp 16 | Ht 62.0 in | Wt 141.4 lb

## 2015-09-28 DIAGNOSIS — N3941 Urge incontinence: Secondary | ICD-10-CM

## 2015-09-28 DIAGNOSIS — N3090 Cystitis, unspecified without hematuria: Secondary | ICD-10-CM

## 2015-09-28 LAB — URINALYSIS, COMPLETE
Bilirubin, UA: NEGATIVE
Glucose, UA: NEGATIVE
Ketones, UA: NEGATIVE
NITRITE UA: NEGATIVE
Protein, UA: NEGATIVE
Specific Gravity, UA: 1.025 (ref 1.005–1.030)
Urobilinogen, Ur: 0.2 mg/dL (ref 0.2–1.0)
pH, UA: 5.5 (ref 5.0–7.5)

## 2015-09-28 LAB — MICROSCOPIC EXAMINATION

## 2015-09-28 MED ORDER — SODIUM BICARBONATE 8.4 % IV SOLN: 11.0000 mL | Freq: Once | INTRAVENOUS | Status: DC

## 2015-09-28 NOTE — Progress Notes (Signed)
10:00 AM   [name] 12/04/1929 [id number]  Referring provider: Bobetta Lime, MD [address] [place], [place] [address]  Chief Complaint  Patient presents with  . Cystitis    Rescue solution    HPI: Patient is an 82 year old white female with a history of IC who presents today for rescue installation.      Today, she is experiencing mild suprapubic burning. Her UA is unremarkable. She denies any gross hematuria or other changes in urinary symptoms. She's not had any fevers, chills, nausea or vomiting.  Interval history 09/28/15 Some increase in urgency and urge incontinence. Missed two weeks of instillations due to illnesses.  PMH: Past Medical History  Diagnosis Date  . GERD (gastroesophageal reflux disease)   . Vertigo   . Essential hypertension, benign   . Paroxysmal supraventricular tachycardia ([place])   . Pure hypercholesterolemia   . MS (multiple sclerosis) ([place])   . Fibromyalgia   . Diverticulosis   . Diabetes mellitus without complication ([place])   . Osteoarthritis   . Dyslipidemia   . SOB (shortness of breath)     SECONDARY TO REFLUX  . Chest pain     SECONDARY TO GERD  . Peripheral neuropathy (HCC)     MILD  . Cancer ([place])     breast  . Depression   . Chronic interstitial cystitis   . History of fractured rib   . Chronic right hip pain   . History of TIA (transient ischemic attack)   . Carpal tunnel syndrome   . Thyroid nodule   . Hearing loss of left ear   . Vitamin B 12 deficiency   . Protein malnutrition ([place])   . Frequent falls   . Anxiety and depression   . Irritable bowel   . Recurrent UTI     Surgical History: Past Surgical History  Procedure Laterality Date  . Total abdominal hysterectomy w/ bilateral salpingoophorectomy    . Mastectomy Right     DX MASTITIS/NO CANCER.Marland KitchenSALINE IMPLANT  . Cholecystectomy    . Hemorrhoid surgery      WITH RECONSTRUCTION  . Bladder tacking      X 3  . Eye surgeries      WITH  BUCKLE DETACHMENT OF THE RETINA  . Tonsillectomy    . Appendectomy    . Tympanoplasty    . Breast surgery    . Mastectomy    . Breast lumpectomy    . Colonoscopy with propofol N/A 01/21/2015    Procedure: COLONOSCOPY WITH PROPOFOL;  Surgeon: Josefine Class, MD;  Location: [facility] ENDOSCOPY;  Service: Endoscopy;  Laterality: N/A;  . Esophagogastroduodenoscopy N/A 01/21/2015    Procedure: ESOPHAGOGASTRODUODENOSCOPY (EGD);  Surgeon: Josefine Class, MD;  Location: [facility] ENDOSCOPY;  Service: Endoscopy;  Laterality: N/A;    Home Medications:    Medication List       This list is accurate as of: 09/28/15 10:00 AM.  Always use your most recent med list.               acetaminophen 500 MG tablet  Commonly known as:  TYLENOL  Take 500 mg by mouth every 6 (six) hours as needed.     [employer] VITAMIN B12 2000 MCG tablet  Generic drug:  cyanocobalamin  Take by mouth.     dicyclomine 10 MG capsule  Commonly known as:  BENTYL  TAKE 1 CAPSULE (10 MG TOTAL) BY MOUTH EVERY 8 (EIGHT) HOURS AS NEEDED.  ferrous sulfate 325 (65 FE) MG tablet  Take 1 tablet (325 mg total) by mouth daily with breakfast.     FINGERSTIX LANCETS Misc  1 Device by Does not apply route 3 (three) times daily.     glucose blood test strip  1 each by Other route as needed for other. Use as instructed to check blood glucose 3x/day     glucose blood test strip  Use as instructed to check blood glucose 3x/day     ibuprofen 200 MG tablet  Commonly known as:  ADVIL,MOTRIN  Take 200 mg by mouth every 6 (six) hours as needed.     meclizine 25 MG tablet  Commonly known as:  ANTIVERT  TAKE ONE TO TWO TABLETS BY MOUTH EVERY 8 HOURS AS NEEDED     metFORMIN 500 MG tablet  Commonly known as:  GLUCOPHAGE  Take 1 tablet (500 mg total) by mouth daily with breakfast.     omeprazole 40 MG capsule  Commonly known as:  PRILOSEC  Take 1 capsule (40 mg total) by mouth daily.     ondansetron 4 MG disintegrating tablet    Commonly known as:  ZOFRAN ODT  Take 1 tablet (4 mg total) by mouth every 6 (six) hours as needed for nausea or vomiting.     oxyCODONE-acetaminophen 5-325 MG tablet  Commonly known as:  ROXICET  Take 1 tablet by mouth every 6 (six) hours as needed for severe pain.     polyethylene glycol packet  Commonly known as:  MIRALAX / GLYCOLAX  Take 17 g by mouth daily as needed.     PROBIOTIC COLON SUPPORT Caps  Take by mouth.     rosuvastatin 5 MG tablet  Commonly known as:  CRESTOR  Take 0.5 tablets (2.5 mg total) by mouth at bedtime.     sertraline 50 MG tablet  Commonly known as:  ZOLOFT  Take 1 tablet (50 mg total) by mouth daily.     TRUE METRIX AIR GLUCOSE METER Devi  1 Device by Does not apply route 3 (three) times daily.     URIBEL 118 MG Caps  One tablet four times daily as needed with 8 oz of water     VITAMIN D-1000 MAX ST 1000 units tablet  Generic drug:  Cholecalciferol  Take by mouth.        Allergies:  Allergies  Allergen Reactions  . Biaxin [Clarithromycin] Other (See Comments) and Diarrhea  . Copaxone [Glatiramer Acetate]   . Decadron [Dexamethasone] Nausea And Vomiting and Other (See Comments)    Other reaction(s): Muscle Pain Reaction:  Abdominal pain  . Dexamethasone Sodium Phosphate Other (See Comments)  . Diltiazem Hcl Other (See Comments)    Reaction:  Unknown   . Diltiazem Hcl Other (See Comments)  . Flagyl [Metronidazole] Nausea Only and Diarrhea    Other reaction(s): Vomiting  . Gabapentin Other (See Comments)    "burning all over" Reaction:  Unknown   . Iohexol Swelling and Other (See Comments)     Desc: tongue swelling with "IVP dye" sccording to nurses notes with a lumbar myelo-12/09- asm, Onset Date: [id number]  Pts tongue swells.  Clementeen Hoof [Iodinated Diagnostic Agents] Swelling and Other (See Comments)    Passed out  Pts tongue swells.   . Levothyroxine Nausea Only  . Sulfonamide Derivatives Other (See Comments)    Reaction:   Unknown   . Synthroid [Levothyroxine Sodium]   . Copaxone  [Glatiramer] Rash  . Interferon Beta-1a Other (  See Comments) and Rash    Reaction:  Unknown     Family History: Family History  Problem Relation Age of Onset  . Arthritis Sister   . Aneurysm Brother   . Heart disease Daughter   . ALS Mother   . Colon cancer    . Kidney disease Neg Hx     Social History:  reports that she has never smoked. She has never used smokeless tobacco. She reports that she does not drink alcohol or use illicit drugs.  ROS: UROLOGY Frequent Urination?: No Hard to postpone urination?: Yes Burning/pain with urination?: Yes Get up at night to urinate?: Yes Leakage of urine?: Yes Urine stream starts and stops?: No Trouble starting stream?: No Do you have to strain to urinate?: No Blood in urine?: No Urinary tract infection?: No Sexually transmitted disease?: No Injury to kidneys or bladder?: No Painful intercourse?: No Weak stream?: No Currently pregnant?: No Vaginal bleeding?: No Last menstrual period?: n  Gastrointestinal Nausea?: No Vomiting?: No Indigestion/heartburn?: Yes Diarrhea?: Yes Constipation?: Yes  Constitutional Fever: No Night sweats?: Yes Weight loss?: No Fatigue?: Yes  Skin Skin rash/lesions?: No Itching?: No  Eyes Blurred vision?: Yes Double vision?: No  Ears/Nose/Throat Sore throat?: No Sinus problems?: No  Hematologic/Lymphatic Swollen glands?: No Easy bruising?: No  Cardiovascular Leg swelling?: No Chest pain?: No  Respiratory Cough?: No Shortness of breath?: No  Endocrine Excessive thirst?: No  Musculoskeletal Back pain?: Yes Joint pain?: Yes  Neurological Headaches?: Yes Dizziness?: Yes  Psychologic Depression?: Yes Anxiety?: Yes  Physical Exam: Blood pressure 156/88, pulse 69, height 5' (1.524 m), weight 157 lb 9.6 oz (71.487 kg). Constitutional: Well nourished. Alert and oriented, No acute distress. HEENT: [place] AT, moist  mucus membranes. Trachea midline, no masses. Cardiovascular: No clubbing, cyanosis, or edema. Respiratory: Normal respiratory effort, no increased work of breathing. GI: Abdomen is soft, non tender, non distended, no abdominal masses. Liver and spleen not palpable.  No hernias appreciated.  Stool sample for occult testing is not indicated.   GU: No CVA tenderness.  No bladder fullness or masses.   Skin: No rashes, bruises or suspicious lesions. Lymph: No cervical or inguinal adenopathy. Neurologic: Grossly intact, no focal deficits, moving all 4 extremities. Psychiatric: Normal mood and affect.   Laboratory Data: Lab Results  Component Value Date   WBC 5.4 08/03/2015   HGB 13.7 08/03/2015   HCT 41.4 08/03/2015   MCV 89.2 08/03/2015   PLT 181 08/03/2015   Lab Results  Component Value Date   CREATININE 0.96 08/03/2015   Lab Results  Component Value Date   HGBA1C 7.0 09/02/2015   Pertinent Imaging CLINICAL DATA: Microscopic hematuria  EXAM: RENAL / URINARY TRACT ULTRASOUND COMPLETE  COMPARISON: Abdominal and pelvic CT scan without contrast dated November 12, 2014.  FINDINGS: Right Kidney:  Length: 9.2 cm. Echogenicity within normal limits. No mass or hydronephrosis visualized.  Left Kidney:  Length: 10.0 cm. There are multiple renal parenchymal cysts on the left. The faintly calcified structure in the posterior aspect of the midpole seen on the previous study is not clearly evident on today's ultrasound. There is a midpole cyst measuring 1.5 x 1.6 x 1.8 cm. There is an upper pole cyst measuring 0.9 cm in diameter. There is a lower pole cyst measuring 1.1 cm in diameter. There is no hydronephrosis.  Bladder:  The urinary bladder is nondistended and poorly evaluated.  IMPRESSION: 1. There are simple appearing cysts in the left kidney. The radiodense structure in the midpole  of the right kidney demonstrated on the previous CT scan is not clearly evident  on today's ultrasound. 2. The right kidney is unremarkable. 3. The urinary bladder is not well evaluated on today's study. 4. If there is persistent microscopic hematuria and the urinary bladder has been cleared, MRI of the kidneys would be a useful next imaging step in an effort to evaluate the known hyperdense midpole lesion on the left which is not evident on today's ultrasound.   Electronically Signed  By: David Martinique M.D.  On: 07/19/2015 14:29          Urinalysis: Results for orders placed or performed in visit on 09/02/15  POCT UA - Microalbumin  Result Value Ref Range   Microalbumin Ur, POC 100 mg/L   Creatinine, POC  mg/dL   Albumin/Creatinine Ratio, Urine, POC    POCT HgB A1C  Result Value Ref Range   Hemoglobin A1C 7.0   POCT Glucose (CBG)  Result Value Ref Range   POC Glucose 163 (A) 70 - 99 mg/dl    Assessment & Plan:    1. Interstitial cystitis:   Delayed rescue solution today. Patient experiencing worsening urgency and incontinence concerning for possible infection.  Urine sent for culture  She will RTC in one week for her next installation.   - Urinalysis, Complete   No Follow-up on file.  Herbert Moors, [facility] [address], [place], [place] [address] [phone]

## 2015-09-30 ENCOUNTER — Telehealth: Payer: Self-pay

## 2015-09-30 LAB — CULTURE, URINE COMPREHENSIVE

## 2015-09-30 MED ORDER — AMOXICILLIN-POT CLAVULANATE 875-125 MG PO TABS: 1.0000 | ORAL_TABLET | Freq: Two times a day (BID) | ORAL | Status: AC

## 2015-09-30 NOTE — Telephone Encounter (Signed)
-----   Message from Roda Shutters, [place] sent at 09/30/2015 11:50 AM EST ----- Please notify patient that her urine culture was positive for infection. I would like her to start taking Augmentin 800 twice daily 1 week. Please send prescription in to her pharmacy. We will recheck her urine when she returns for her next rescue installation. Thanks

## 2015-09-30 NOTE — Telephone Encounter (Signed)
Spoke with pt in reference to +ucx. Made aware medication has been sent to pharmacy. Pt voiced understanding.

## 2015-10-04 ENCOUNTER — Other Ambulatory Visit: Payer: Self-pay

## 2015-10-05 ENCOUNTER — Encounter: Payer: Self-pay | Admitting: Obstetrics and Gynecology

## 2015-10-05 ENCOUNTER — Ambulatory Visit: Payer: Commercial Managed Care - HMO | Admitting: Obstetrics and Gynecology

## 2015-10-05 ENCOUNTER — Ambulatory Visit (INDEPENDENT_AMBULATORY_CARE_PROVIDER_SITE_OTHER): Payer: Commercial Managed Care - HMO | Admitting: Obstetrics and Gynecology

## 2015-10-05 VITALS — BP 171/81 | HR 69 | Resp 16 | Ht 62.0 in | Wt 158.1 lb

## 2015-10-05 DIAGNOSIS — N3090 Cystitis, unspecified without hematuria: Secondary | ICD-10-CM | POA: Diagnosis not present

## 2015-10-05 LAB — URINALYSIS, COMPLETE
Bilirubin, UA: NEGATIVE
Glucose, UA: NEGATIVE
Ketones, UA: NEGATIVE
LEUKOCYTES UA: NEGATIVE
Nitrite, UA: NEGATIVE
PH UA: 5.5 (ref 5.0–7.5)
Protein, UA: NEGATIVE
SPEC GRAV UA: 1.02 (ref 1.005–1.030)
Urobilinogen, Ur: 0.2 mg/dL (ref 0.2–1.0)

## 2015-10-05 LAB — MICROSCOPIC EXAMINATION
BACTERIA UA: NONE SEEN
WBC, UA: NONE SEEN /hpf (ref 0–?)

## 2015-10-05 MED ORDER — SODIUM BICARBONATE 8.4 % IV SOLN
11.0000 mL | Freq: Once | INTRAVENOUS | Status: AC
  Administered 2015-10-05: 11 mL

## 2015-10-05 NOTE — Progress Notes (Signed)
Bladder Rescue Solution Instillation  Due to Cystitis patient is present today for a Rescue Solution Treatment.  Patient was cleaned and prepped in a sterile fashion with betadine and lidocaine 2% jelly was instilled into the urethra.  A 14 FR catheter was inserted, urine return was noted 10 ml, urine was yellow in color.  Instilled a solution consisting of 64ml of Sodium Bicarb, 2 ml Lidocaine and 1 ml of Heparin. The catheter was then removed. Patient tolerated well, no complications were noted.   Performed by: Larna Daughters

## 2015-10-05 NOTE — Progress Notes (Signed)
11:09 AM   [name] 04-Jun-1930 [id number]  Referring provider: Bobetta Lime, MD [address] [place], [place] [address]  Chief Complaint  Patient presents with  . Cystitis    rescue solution    HPI: Patient is an 82 year old white female with a history of IC who presents today for rescue installation.      Today, she is experiencing mild suprapubic burning. Her UA is unremarkable. She denies any gross hematuria or other changes in urinary symptoms. She's not had any fevers, chills, nausea or vomiting.  Interval history 10/06/15  History reports symptoms have improved probiotic Klebsiella positive urinary tract infection. She presents today for continuation of her rescue solutions for interstitial cystitis. No fevers flank pain or gross hematuria.  PMH: Past Medical History  Diagnosis Date  . GERD (gastroesophageal reflux disease)   . Vertigo   . Essential hypertension, benign   . Paroxysmal supraventricular tachycardia ([place])   . Pure hypercholesterolemia   . MS (multiple sclerosis) ([place])   . Fibromyalgia   . Diverticulosis   . Diabetes mellitus without complication ([place])   . Osteoarthritis   . Dyslipidemia   . SOB (shortness of breath)     SECONDARY TO REFLUX  . Chest pain     SECONDARY TO GERD  . Peripheral neuropathy (HCC)     MILD  . Cancer ([place])     breast  . Depression   . Chronic interstitial cystitis   . History of fractured rib   . Chronic right hip pain   . History of TIA (transient ischemic attack)   . Carpal tunnel syndrome   . Thyroid nodule   . Hearing loss of left ear   . Vitamin B 12 deficiency   . Protein malnutrition ([place])   . Frequent falls   . Anxiety and depression   . Irritable bowel   . Recurrent UTI     Surgical History: Past Surgical History  Procedure Laterality Date  . Total abdominal hysterectomy w/ bilateral salpingoophorectomy    . Mastectomy Right     DX MASTITIS/NO CANCER.Marland KitchenSALINE IMPLANT  .  Cholecystectomy    . Hemorrhoid surgery      WITH RECONSTRUCTION  . Bladder tacking      X 3  . Eye surgeries      WITH BUCKLE DETACHMENT OF THE RETINA  . Tonsillectomy    . Appendectomy    . Tympanoplasty    . Breast surgery    . Mastectomy    . Breast lumpectomy    . Colonoscopy with propofol N/A 01/21/2015    Procedure: COLONOSCOPY WITH PROPOFOL;  Surgeon: Josefine Class, MD;  Location: [facility] ENDOSCOPY;  Service: Endoscopy;  Laterality: N/A;  . Esophagogastroduodenoscopy N/A 01/21/2015    Procedure: ESOPHAGOGASTRODUODENOSCOPY (EGD);  Surgeon: Josefine Class, MD;  Location: [facility] ENDOSCOPY;  Service: Endoscopy;  Laterality: N/A;    Home Medications:    Medication List       This list is accurate as of: 10/05/15 11:09 AM.  Always use your most recent med list.               acetaminophen 500 MG tablet  Commonly known as:  TYLENOL  Take 500 mg by mouth every 6 (six) hours as needed.     amoxicillin-clavulanate 875-125 MG tablet  Commonly known as:  AUGMENTIN  Take 1 tablet by mouth 2 (two) times daily.     [employer]S VITAMIN B12 2000 MCG tablet  Generic drug:  cyanocobalamin  Take by mouth.     dicyclomine 10 MG capsule  Commonly known as:  BENTYL  TAKE 1 CAPSULE (10 MG TOTAL) BY MOUTH EVERY 8 (EIGHT) HOURS AS NEEDED.     ferrous sulfate 325 (65 FE) MG tablet  Take 1 tablet (325 mg total) by mouth daily with breakfast.     FINGERSTIX LANCETS Misc  1 Device by Does not apply route 3 (three) times daily.     glucose blood test strip  1 each by Other route as needed for other. Use as instructed to check blood glucose 3x/day     ibuprofen 200 MG tablet  Commonly known as:  ADVIL,MOTRIN  Take 200 mg by mouth every 6 (six) hours as needed.     meclizine 25 MG tablet  Commonly known as:  ANTIVERT  TAKE ONE TO TWO TABLETS BY MOUTH EVERY 8 HOURS AS NEEDED     metFORMIN 500 MG tablet  Commonly known as:  GLUCOPHAGE  Take 1 tablet (500 mg total) by mouth daily with  breakfast.     omeprazole 40 MG capsule  Commonly known as:  PRILOSEC  Take 1 capsule (40 mg total) by mouth daily.     ondansetron 4 MG disintegrating tablet  Commonly known as:  ZOFRAN ODT  Take 1 tablet (4 mg total) by mouth every 6 (six) hours as needed for nausea or vomiting.     oxyCODONE-acetaminophen 5-325 MG tablet  Commonly known as:  ROXICET  Take 1 tablet by mouth every 6 (six) hours as needed for severe pain.     polyethylene glycol packet  Commonly known as:  MIRALAX / GLYCOLAX  Take 17 g by mouth daily as needed.     PROBIOTIC COLON SUPPORT Caps  Take by mouth.     rosuvastatin 5 MG tablet  Commonly known as:  CRESTOR  Take 0.5 tablets (2.5 mg total) by mouth at bedtime.     sertraline 50 MG tablet  Commonly known as:  ZOLOFT  Take 1 tablet (50 mg total) by mouth daily.     TRUE METRIX AIR GLUCOSE METER Devi  1 Device by Does not apply route 3 (three) times daily.     URIBEL 118 MG Caps  One tablet four times daily as needed with 8 oz of water     VITAMIN D-1000 MAX ST 1000 units tablet  Generic drug:  Cholecalciferol  Take by mouth.        Allergies:  Allergies  Allergen Reactions  . Biaxin [Clarithromycin] Other (See Comments) and Diarrhea  . Copaxone [Glatiramer Acetate]   . Decadron [Dexamethasone] Nausea And Vomiting and Other (See Comments)    Other reaction(s): Muscle Pain Reaction:  Abdominal pain  . Dexamethasone Sodium Phosphate Other (See Comments)  . Diltiazem Hcl Other (See Comments)    Reaction:  Unknown   . Diltiazem Hcl Other (See Comments)  . Flagyl [Metronidazole] Nausea Only and Diarrhea    Other reaction(s): Vomiting  . Gabapentin Other (See Comments)    "burning all over" Reaction:  Unknown   . Iohexol Swelling and Other (See Comments)     Desc: tongue swelling with "IVP dye" sccording to nurses notes with a lumbar myelo-12/09- asm, Onset Date: [id number]  Pts tongue swells.  Clementeen Hoof [Iodinated Diagnostic Agents]  Swelling and Other (See Comments)    Passed out  Pts tongue swells.   . Levothyroxine Nausea Only  . Sulfonamide Derivatives Other (See Comments)  Reaction:  Unknown   . Synthroid [Levothyroxine Sodium]   . Copaxone  [Glatiramer] Rash  . Interferon Beta-1a Other (See Comments) and Rash    Reaction:  Unknown     Family History: Family History  Problem Relation Age of Onset  . Arthritis Sister   . Aneurysm Brother   . Heart disease Daughter   . ALS Mother   . Colon cancer    . Kidney disease Neg Hx     Social History:  reports that she has never smoked. She has never used smokeless tobacco. She reports that she does not drink alcohol or use illicit drugs.  ROS: UROLOGY Frequent Urination?: No Hard to postpone urination?: No Burning/pain with urination?: Yes Get up at night to urinate?: Yes Leakage of urine?: No Urine stream starts and stops?: No Trouble starting stream?: No Do you have to strain to urinate?: No Blood in urine?: No Urinary tract infection?: Yes Sexually transmitted disease?: No Injury to kidneys or bladder?: No Painful intercourse?: No Weak stream?: No Currently pregnant?: No Vaginal bleeding?: No Last menstrual period?: n  Gastrointestinal Nausea?: No Vomiting?: No Indigestion/heartburn?: Yes Diarrhea?: Yes Constipation?: Yes  Constitutional Fever: No Night sweats?: Yes Weight loss?: No Fatigue?: Yes  Skin Skin rash/lesions?: No Itching?: No  Eyes Blurred vision?: Yes Double vision?: No  Ears/Nose/Throat Sore throat?: No Sinus problems?: Yes  Hematologic/Lymphatic Swollen glands?: No Easy bruising?: No  Cardiovascular Leg swelling?: No Chest pain?: No  Respiratory Cough?: No Shortness of breath?: No  Endocrine Excessive thirst?: No  Musculoskeletal Back pain?: Yes Joint pain?: Yes  Neurological Headaches?: Yes Dizziness?: Yes  Psychologic Depression?: Yes Anxiety?: Yes  Physical Exam: Blood pressure  156/88, pulse 69, height 5' (1.524 m), weight 157 lb 9.6 oz (71.487 kg). Constitutional: Well nourished. Alert and oriented, No acute distress. HEENT: [place] AT, moist mucus membranes. Trachea midline, no masses. Cardiovascular: No clubbing, cyanosis, or edema. Respiratory: Normal respiratory effort, no increased work of breathing. GI: Abdomen is soft, non tender, non distended, no abdominal masses. Liver and spleen not palpable.  No hernias appreciated.  Stool sample for occult testing is not indicated.   GU: No CVA tenderness.  No bladder fullness or masses.   Skin: No rashes, bruises or suspicious lesions. Lymph: No cervical or inguinal adenopathy. Neurologic: Grossly intact, no focal deficits, moving all 4 extremities. Psychiatric: Normal mood and affect.   Laboratory Data: Lab Results  Component Value Date   WBC 5.4 08/03/2015   HGB 13.7 08/03/2015   HCT 41.4 08/03/2015   MCV 89.2 08/03/2015   PLT 181 08/03/2015   Lab Results  Component Value Date   CREATININE 0.96 08/03/2015   Lab Results  Component Value Date   HGBA1C 7.0 09/02/2015   Pertinent Imaging CLINICAL DATA: Microscopic hematuria  EXAM: RENAL / URINARY TRACT ULTRASOUND COMPLETE  COMPARISON: Abdominal and pelvic CT scan without contrast dated November 12, 2014.  FINDINGS: Right Kidney:  Length: 9.2 cm. Echogenicity within normal limits. No mass or hydronephrosis visualized.  Left Kidney:  Length: 10.0 cm. There are multiple renal parenchymal cysts on the left. The faintly calcified structure in the posterior aspect of the midpole seen on the previous study is not clearly evident on today's ultrasound. There is a midpole cyst measuring 1.5 x 1.6 x 1.8 cm. There is an upper pole cyst measuring 0.9 cm in diameter. There is a lower pole cyst measuring 1.1 cm in diameter. There is no hydronephrosis.  Bladder:  The urinary bladder is nondistended  and poorly evaluated.  IMPRESSION: 1. There are  simple appearing cysts in the left kidney. The radiodense structure in the midpole of the right kidney demonstrated on the previous CT scan is not clearly evident on today's ultrasound. 2. The right kidney is unremarkable. 3. The urinary bladder is not well evaluated on today's study. 4. If there is persistent microscopic hematuria and the urinary bladder has been cleared, MRI of the kidneys would be a useful next imaging step in an effort to evaluate the known hyperdense midpole lesion on the left which is not evident on today's ultrasound.   Electronically Signed  By: David Martinique M.D.  On: 07/19/2015 14:29          Urinalysis: Results for orders placed or performed in visit on 09/28/15  CULTURE, URINE COMPREHENSIVE  Result Value Ref Range   Urine Culture, Comprehensive Final report (A)    Result 1 Klebsiella pneumoniae (A)    ANTIMICROBIAL SUSCEPTIBILITY Comment   Microscopic Examination  Result Value Ref Range   WBC, UA 11-30 (A) 0 -  5 /hpf   RBC, UA 11-30 (A) 0 -  2 /hpf   Epithelial Cells (non renal) >10 (A) 0 - 10 /hpf   Renal Epithel, UA 0-10 (A) None seen /hpf   Bacteria, UA Few None seen/Few  Urinalysis, Complete  Result Value Ref Range   Specific Gravity, UA 1.025 1.005 - 1.030   pH, UA 5.5 5.0 - 7.5   Color, UA Green (A) Yellow   Appearance Ur Clear Clear   Leukocytes, UA 1+ (A) Negative   Protein, UA Negative Negative/Trace   Glucose, UA Negative Negative   Ketones, UA Negative Negative   RBC, UA 1+ (A) Negative   Bilirubin, UA Negative Negative   Urobilinogen, Ur 0.2 0.2 - 1.0 mg/dL   Nitrite, UA Negative Negative   Microscopic Examination See below:     Assessment & Plan:    1. Interstitial cystitis:  Patient reports previous urinary symptoms have somewhat improved. She completed treatment for her previous urinary tract infection. Rescue solution administered today. UTI mention strategies reviewed. - Urinalysis, Complete   Return in  about 1 week (around 10/12/2015) for with [facility].  Herbert Moors, [facility] [address], [place], [place] [address] [phone]

## 2015-10-12 ENCOUNTER — Ambulatory Visit (INDEPENDENT_AMBULATORY_CARE_PROVIDER_SITE_OTHER): Payer: Commercial Managed Care - HMO | Admitting: Family Medicine

## 2015-10-12 ENCOUNTER — Encounter: Payer: Self-pay | Admitting: Obstetrics and Gynecology

## 2015-10-12 ENCOUNTER — Ambulatory Visit (INDEPENDENT_AMBULATORY_CARE_PROVIDER_SITE_OTHER): Payer: Commercial Managed Care - HMO | Admitting: Obstetrics and Gynecology

## 2015-10-12 ENCOUNTER — Encounter: Payer: Self-pay | Admitting: Family Medicine

## 2015-10-12 VITALS — BP 163/83 | HR 66 | Resp 16 | Ht 62.0 in | Wt 158.8 lb

## 2015-10-12 VITALS — BP 122/78 | HR 84 | Temp 98.7°F | Resp 14 | Wt 158.0 lb

## 2015-10-12 DIAGNOSIS — M2550 Pain in unspecified joint: Secondary | ICD-10-CM | POA: Diagnosis not present

## 2015-10-12 DIAGNOSIS — N3090 Cystitis, unspecified without hematuria: Secondary | ICD-10-CM

## 2015-10-12 DIAGNOSIS — E1140 Type 2 diabetes mellitus with diabetic neuropathy, unspecified: Secondary | ICD-10-CM

## 2015-10-12 DIAGNOSIS — F3341 Major depressive disorder, recurrent, in partial remission: Secondary | ICD-10-CM

## 2015-10-12 DIAGNOSIS — G8929 Other chronic pain: Secondary | ICD-10-CM

## 2015-10-12 DIAGNOSIS — R296 Repeated falls: Secondary | ICD-10-CM

## 2015-10-12 DIAGNOSIS — I10 Essential (primary) hypertension: Secondary | ICD-10-CM | POA: Diagnosis not present

## 2015-10-12 LAB — URINALYSIS, COMPLETE
BILIRUBIN UA: NEGATIVE
Glucose, UA: NEGATIVE
Ketones, UA: NEGATIVE
Nitrite, UA: NEGATIVE
Protein, UA: NEGATIVE
RBC UA: NEGATIVE
Specific Gravity, UA: 1.02 (ref 1.005–1.030)
Urobilinogen, Ur: 0.2 mg/dL (ref 0.2–1.0)
pH, UA: 6 (ref 5.0–7.5)

## 2015-10-12 LAB — MICROSCOPIC EXAMINATION: Bacteria, UA: NONE SEEN

## 2015-10-12 NOTE — Progress Notes (Signed)
Name: [name]   MRN: [id number]    DOB: 01-01-1930   Date:10/12/2015       Progress Note  Subjective  Chief Complaint  Chief Complaint  Patient presents with  . Medication Refill  . Diabetes    checks glucose 1x per day   . Hyperlipidemia  . Depression    HPI  [name] is a 82 year old female with a complex medical history and ongoing issues with interstitial cystitis. In regards to her Diabetes she feels that her medical condition is well controlled. First diagnosed with DM Type II several years ago. Current diabetes medication regimen includes Metformin 547m once a day. Last Hba1c was 7% on 09/02/15. Patient is taking medications as instructed. Overall the patient feels that their blood glucose is well controlled. Checking blood glucose at home 1x/day. No symptoms of hypoglycemia are reported today. Related symptoms include increased fatigue and peripheral neuropathy. Related medical diagnosis include HTN, HLD. The last retinal eye exam was 2016, goes annually. Patient presents with hyperlipidemia as well. She was tested because co morbid conditions such as DM II, HTN. There is a family history of hyperlipidemia. There is not a family history of early ischemia heart disease.   Patient complains of depression with anxious moods, often worries about her health. She complains of depressed mood, fatigue and impaired memory. Onset was approximately several years ago, stable since that time. She denies current suicidal and homicidal plan or intent. Family history significant for alcoholism, anxiety, depression and substance abuse. Possible organic causes contributing are: medications, endocrine/metabolic, nutritional. Risk factors: previous episode of depression. Previous treatment includes Zoloft and individual therapy. She complains of the following side effects from the treatment: none.   Abdominal pain chronic, likely due to scar tissue it has been determined as extensive  workup has been negative.   Despite using walker she has a fall almost every month. She does complain of right hip pain for years for which she uses Percocet [phone].   [place] Will be closer to other children as well.  Past Medical History  Diagnosis Date  . GERD (gastroesophageal reflux disease)   . Vertigo   . Essential hypertension, benign   . Paroxysmal supraventricular tachycardia (HCAmity  . Pure hypercholesterolemia   . MS (multiple sclerosis) (HCA[place]  . Fibromyalgia   . Diverticulosis   . Diabetes mellitus without complication ([facility]  . Osteoarthritis   . Dyslipidemia   . SOB (shortness of breath)     SECONDARY TO REFLUX  . Chest pain     SECONDARY TO GERD  . Peripheral neuropathy (HCC)     MILD  . Cancer ([place]    breast  . Depression   . Chronic interstitial cystitis   . History of fractured rib   . Chronic right hip pain   . History of TIA (transient ischemic attack)   . Carpal tunnel syndrome   . Thyroid nodule   . Hearing loss of left ear   . Vitamin B 12 deficiency   . Protein malnutrition (HC[place]  . Frequent falls   . Anxiety and depression   . Irritable bowel   . Recurrent UTI     Patient Active Problem List   Diagnosis Date Noted  . Diabetic neuropathy with neurologic complication ([id number]. Chronic pain of multiple joints 09/02/2015  . Right lower quadrant abdominal pain 08/02/2015  . Microscopic hematuria  07/12/2015  . Recurrent UTI 04/09/2015  . Dysuria 04/01/2015  . Carpal tunnel syndrome 01/12/2014  . DD (diverticular disease) 01/12/2014  . Carcinoma in situ, breast, ductal 01/12/2014  . H/O surgical procedure 01/12/2014  . H/O right mastectomy 01/12/2014  . Hypercholesteremia 01/12/2014  . DS (disseminated sclerosis) ([place]) 01/12/2014  . Arthritis, degenerative 01/12/2014  . Thyroid nodule 01/12/2014  . Cyanocobalamine deficiency (non anemic) 01/12/2014  . DM (diabetes  mellitus) type II controlled, neurological manifestation ([place]) 06/26/2010  . Depression, major, recurrent, in partial remission ([place]) 06/26/2010  . Frequent falls 06/26/2010  . Stricture and stenosis of esophagus 06/26/2010  . Gastro-esophageal reflux disease without esophagitis 06/26/2010  . Fibromyalgia 06/26/2010  . Chronic interstitial cystitis 06/26/2010  . H/O malignant neoplasm of breast 06/26/2010  . Hypertension goal BP (blood pressure) < 140/90 06/01/2009  . H/O paroxysmal supraventricular tachycardia 06/01/2009  . Paroxysmal supraventricular tachycardia ([place]) 06/01/2009  . Irritable bowel syndrome with constipation and diarrhea 06/16/2008  . Diarrhea, functional 06/16/2008  . D (diarrhea) 06/16/2008    Social History  Substance Use Topics  . Smoking status: Never Smoker   . Smokeless tobacco: Never Used  . Alcohol Use: No     Current outpatient prescriptions:  .  Blood Glucose Monitoring Suppl (TRUE METRIX AIR GLUCOSE METER) DEVI, 1 Device by Does not apply route 3 (three) times daily., Disp: 1 Device, Rfl: 0 .  Cholecalciferol (VITAMIN D-1000 MAX ST) 1000 UNITS tablet, Take by mouth., Disp: , Rfl:  .  cyanocobalamin ([employer] VITAMIN B12) 2000 MCG tablet, Take by mouth., Disp: , Rfl:  .  dicyclomine (BENTYL) 10 MG capsule, TAKE 1 CAPSULE (10 MG TOTAL) BY MOUTH EVERY 8 (EIGHT) HOURS AS NEEDED., Disp: , Rfl: 1 .  ferrous sulfate 325 (65 FE) MG tablet, Take 1 tablet (325 mg total) by mouth daily with breakfast., Disp: 90 tablet, Rfl: 1 .  FINGERSTIX LANCETS MISC, 1 Device by Does not apply route 3 (three) times daily., Disp: 200 each, Rfl: 1 .  glucose blood test strip, 1 each by Other route as needed for other. Use as instructed to check blood glucose 3x/day, Disp: 100 each, Rfl: 5 .  ibuprofen (ADVIL,MOTRIN) 200 MG tablet, Take 200 mg by mouth every 6 (six) hours as needed., Disp: , Rfl:  .  meclizine (ANTIVERT) 25 MG tablet, TAKE ONE TO TWO TABLETS BY MOUTH EVERY 8 HOURS AS  NEEDED, Disp: 40 tablet, Rfl: 1 .  metFORMIN (GLUCOPHAGE) 500 MG tablet, Take 1 tablet (500 mg total) by mouth daily with breakfast., Disp: 90 tablet, Rfl: 2 .  Meth-Hyo-M Bl-Na Phos-Ph Sal (URIBEL) 118 MG CAPS, One tablet four times daily as needed with 8 oz of water, Disp: 25 capsule, Rfl: 3 .  omeprazole (PRILOSEC) 40 MG capsule, Take 1 capsule (40 mg total) by mouth daily., Disp: 90 capsule, Rfl: 2 .  oxyCODONE-acetaminophen (ROXICET) 5-325 MG tablet, Take 1 tablet by mouth every 6 (six) hours as needed for severe pain., Disp: 30 tablet, Rfl: 0 .  polyethylene glycol (MIRALAX / GLYCOLAX) packet, Take 17 g by mouth daily as needed., Disp: , Rfl:  .  Probiotic Product (PROBIOTIC COLON SUPPORT) CAPS, Take by mouth., Disp: , Rfl:  .  rosuvastatin (CRESTOR) 5 MG tablet, Take 0.5 tablets (2.5 mg total) by mouth at bedtime., Disp: 90 tablet, Rfl: 2 .  sertraline (ZOLOFT) 50 MG tablet, Take 1 tablet (50 mg total) by mouth daily., Disp: 90 tablet, Rfl: 2  Past Surgical History  Procedure Laterality  Date  . Total abdominal hysterectomy w/ bilateral salpingoophorectomy    . Mastectomy Right     DX MASTITIS/NO CANCER.Marland KitchenSALINE IMPLANT  . Cholecystectomy    . Hemorrhoid surgery      WITH RECONSTRUCTION  . Bladder tacking      X 3  . Eye surgeries      WITH BUCKLE DETACHMENT OF THE RETINA  . Tonsillectomy    . Appendectomy    . Tympanoplasty    . Breast surgery    . Mastectomy    . Breast lumpectomy    . Colonoscopy with propofol N/A 01/21/2015    Procedure: COLONOSCOPY WITH PROPOFOL;  Surgeon: Josefine Class, MD;  Location: [facility] ENDOSCOPY;  Service: Endoscopy;  Laterality: N/A;  . Esophagogastroduodenoscopy N/A 01/21/2015    Procedure: ESOPHAGOGASTRODUODENOSCOPY (EGD);  Surgeon: Josefine Class, MD;  Location: [facility] ENDOSCOPY;  Service: Endoscopy;  Laterality: N/A;    Family History  Problem Relation Age of Onset  . Arthritis Sister   . Aneurysm Brother   . Heart disease Daughter    . ALS Mother   . Colon cancer    . Kidney disease Neg Hx     Allergies  Allergen Reactions  . Biaxin [Clarithromycin] Other (See Comments) and Diarrhea  . Copaxone [Glatiramer Acetate]   . Decadron [Dexamethasone] Nausea And Vomiting and Other (See Comments)    Other reaction(s): Muscle Pain Reaction:  Abdominal pain  . Dexamethasone Sodium Phosphate Other (See Comments)  . Diltiazem Hcl Other (See Comments)    Reaction:  Unknown   . Diltiazem Hcl Other (See Comments)  . Flagyl [Metronidazole] Nausea Only and Diarrhea    Other reaction(s): Vomiting  . Gabapentin Other (See Comments)    "burning all over" Reaction:  Unknown   . Iohexol Swelling and Other (See Comments)     Desc: tongue swelling with "IVP dye" sccording to nurses notes with a lumbar myelo-12/09- asm, Onset Date: [id number]  Pts tongue swells.  Clementeen Hoof [Iodinated Diagnostic Agents] Swelling and Other (See Comments)    Passed out  Pts tongue swells.   . Levothyroxine Nausea Only  . Sulfonamide Derivatives Other (See Comments)    Reaction:  Unknown   . Synthroid [Levothyroxine Sodium]   . Copaxone  [Glatiramer] Rash  . Interferon Beta-1a Other (See Comments) and Rash    Reaction:  Unknown      Review of Systems  CONSTITUTIONAL: No significant weight changes, fever, chills, weakness or fatigue.  CARDIOVASCULAR: No chest pain, chest pressure or chest discomfort. No palpitations or edema.  RESPIRATORY: No shortness of breath, cough or sputum.  GASTROINTESTINAL: No anorexia, nausea, vomiting. No changes in bowel habits. Chronic intermittent abdominal pain without blood in stools.  GENITOURINARY: No dysuria. No frequency. No discharge.  NEUROLOGICAL: No headache, dizziness, syncope, paralysis, ataxia, numbness or tingling in the extremities. No memory changes. No change in bowel or bladder control.  MUSCULOSKELETAL: Chronic joint pain. No muscle pain. HEMATOLOGIC: No anemia, bleeding or bruising.  LYMPHATICS:  No enlarged lymph nodes.  PSYCHIATRIC: No change in mood. No change in sleep pattern.  ENDOCRINOLOGIC: No reports of sweating, cold or heat intolerance. No polyuria or polydipsia.     Objective  BP 122/78 mmHg  Pulse 84  Temp(Src) 98.7 F (37.1 C) (Oral)  Resp 14  Wt 158 lb (71.668 kg)  SpO2 96% Body mass index is 28.89 kg/(m^2).  Physical Exam  Constitutional: Patient is well developed and well-nourished. In no distress. Walking with use of seated  walker today. Cardiovascular: Normal rate, regular rhythm and normal heart sounds. No murmur heard.  Pulmonary/Chest: Effort normal and breath sounds normal. No respiratory distress. Abdomen: Soft, non tender, bowel sounds normal in all quadrants. Musculoskeletal: Normal range of motion bilateral UE and LE, no joint effusions. Strength LE 4/5 bilaterally with general wasting of hip extensor flexor muscle tone. Strength UE bilaterally 5/5 however she does have pain in her neck and trapezius muscle group that limit the duration of overhead use. No palpable spinal step off or abnormal bruising noted.  Peripheral vascular: Bilateral LE no edema. Neurological: CN II-XII grossly intact with no focal deficits. Alert and oriented to person, place, and time. Coordination, balance, strength, speech are normal. Gait is cautious. Skin: Skin is warm and dry. No rash noted. No erythema.  Psychiatric: Patient has a stable mood and affect. Behavior is normal in office today. Judgment and thought content normal in office today.   Recent Results (from the past 2160 hour(s))  Urinalysis, Complete     Status: Abnormal   Collection Time: 07/20/15 10:22 AM  Result Value Ref Range   Specific Gravity, UA >1.030 (H) 1.005 - 1.030   pH, UA 5.5 5.0 - 7.5   Color, UA Green (A) Yellow   Appearance Ur Clear Clear   Leukocytes, UA Negative Negative   Protein, UA Trace (A) Negative/Trace   Glucose, UA Negative Negative   Ketones, UA Negative Negative   RBC, UA  Negative Negative   Bilirubin, UA Negative Negative   Urobilinogen, Ur 0.2 0.2 - 1.0 mg/dL   Nitrite, UA Negative Negative   Microscopic Examination See below:   Microscopic Examination     Status: Abnormal   Collection Time: 07/20/15 10:22 AM  Result Value Ref Range   WBC, UA 0-5 0 -  5 /hpf   RBC, UA 0-2 0 -  2 /hpf   Epithelial Cells (non renal) 0-10 0 - 10 /hpf   Renal Epithel, UA 0-10 (A) None seen /hpf   Mucus, UA Present (A) Not Estab.   Bacteria, UA Few (A) None seen/Few  Urinalysis, Complete     Status: Abnormal   Collection Time: 07/27/15 10:34 AM  Result Value Ref Range   Specific Gravity, UA 1.025 1.005 - 1.030   pH, UA 5.5 5.0 - 7.5   Color, UA Green (A) Yellow   Appearance Ur Clear Clear   Leukocytes, UA Negative Negative   Protein, UA Negative Negative/Trace   Glucose, UA Negative Negative   Ketones, UA Negative Negative   RBC, UA Trace (A) Negative   Bilirubin, UA Negative Negative   Urobilinogen, Ur 0.2 0.2 - 1.0 mg/dL   Nitrite, UA Negative Negative   Microscopic Examination See below:   Microscopic Examination     Status: Abnormal   Collection Time: 07/27/15 10:34 AM  Result Value Ref Range   WBC, UA 0-5 0 -  5 /hpf   RBC, UA 0-2 0 -  2 /hpf   Epithelial Cells (non renal) 0-10 0 - 10 /hpf   Renal Epithel, UA 0-10 (A) None seen /hpf   Mucus, UA Present (A) Not Estab.   Bacteria, UA Few (A) None seen/Few  CBC with Differential/Platelet     Status: Abnormal   Collection Time: 08/02/15 10:16 AM  Result Value Ref Range   WBC 6.6 3.4 - 10.8 x10E3/uL   RBC 4.45 3.77 - 5.28 x10E6/uL   Hemoglobin 13.1 11.1 - 15.9 g/dL   Hematocrit 40.2 34.0 - 46.6 %  MCV 90 79 - 97 fL   MCH 29.4 26.6 - 33.0 pg   MCHC 32.6 31.5 - 35.7 g/dL   RDW 15.6 (H) 12.3 - 15.4 %   Platelets 211 150 - 379 x10E3/uL   Neutrophils 71 %   Lymphs 20 %   Monocytes 8 %   Eos 1 %   Basos 0 %   Neutrophils Absolute 4.6 1.4 - 7.0 x10E3/uL   Lymphocytes Absolute 1.4 0.7 - 3.1 x10E3/uL    Monocytes Absolute 0.5 0.1 - 0.9 x10E3/uL   EOS (ABSOLUTE) 0.1 0.0 - 0.4 x10E3/uL   Basophils Absolute 0.0 0.0 - 0.2 x10E3/uL   Immature Granulocytes 0 %   Immature Grans (Abs) 0.0 0.0 - 0.1 x10E3/uL  Comprehensive metabolic panel     Status: Abnormal   Collection Time: 08/02/15 10:16 AM  Result Value Ref Range   Glucose 132 (H) 65 - 99 mg/dL   BUN 10 8 - 27 mg/dL   Creatinine, Ser 0.86 0.57 - 1.00 mg/dL   GFR calc non Af Amer 63 >59 mL/min/1.73   GFR calc Af Amer 72 >59 mL/min/1.73   BUN/Creatinine Ratio 12 11 - 26   Sodium 143 134 - 144 mmol/L   Potassium 4.5 3.5 - 5.2 mmol/L   Chloride 101 96 - 106 mmol/L   CO2 25 18 - 29 mmol/L   Calcium 10.0 8.7 - 10.3 mg/dL   Total Protein 6.6 6.0 - 8.5 g/dL   Albumin 4.2 3.5 - 4.7 g/dL   Globulin, Total 2.4 1.5 - 4.5 g/dL   Albumin/Globulin Ratio 1.8 1.1 - 2.5   Bilirubin Total 0.3 0.0 - 1.2 mg/dL   Alkaline Phosphatase 46 39 - 117 IU/L   AST 10 0 - 40 IU/L   ALT 15 0 - 32 IU/L  Amylase     Status: Abnormal   Collection Time: 08/02/15 10:16 AM  Result Value Ref Range   Amylase 125 (H) 31 - 124 U/L  Lipase     Status: Abnormal   Collection Time: 08/02/15 10:16 AM  Result Value Ref Range   Lipase 172 (H) 0 - 59 U/L  Lipase, blood     Status: None   Collection Time: 08/03/15  2:02 PM  Result Value Ref Range   Lipase 19 11 - 51 U/L  Comprehensive metabolic panel     Status: Abnormal   Collection Time: 08/03/15  2:02 PM  Result Value Ref Range   Sodium 140 135 - 145 mmol/L   Potassium 4.4 3.5 - 5.1 mmol/L   Chloride 103 101 - 111 mmol/L   CO2 26 22 - 32 mmol/L   Glucose, Bld 142 (H) 65 - 99 mg/dL   BUN 13 6 - 20 mg/dL   Creatinine, Ser 0.96 0.44 - 1.00 mg/dL   Calcium 9.9 8.9 - 10.3 mg/dL   Total Protein 7.4 6.5 - 8.1 g/dL   Albumin 4.3 3.5 - 5.0 g/dL   AST 11 (L) 15 - 41 U/L   ALT 20 14 - 54 U/L   Alkaline Phosphatase 47 38 - 126 U/L   Total Bilirubin 0.9 0.3 - 1.2 mg/dL   GFR calc non Af Amer 53 (L) >60 mL/min   GFR calc  Af Amer >60 >60 mL/min    Comment: (NOTE) The eGFR has been calculated using the CKD EPI equation. This calculation has not been validated in all clinical situations. eGFR's persistently <60 mL/min signify possible Chronic Kidney Disease.    Anion gap 11  5 - 15  CBC     Status: Abnormal   Collection Time: 08/03/15  2:02 PM  Result Value Ref Range   WBC 5.4 3.6 - 11.0 K/uL   RBC 4.64 3.80 - 5.20 MIL/uL   Hemoglobin 13.7 12.0 - 16.0 g/dL   HCT 41.4 35.0 - 47.0 %   MCV 89.2 80.0 - 100.0 fL   MCH 29.5 26.0 - 34.0 pg   MCHC 33.1 32.0 - 36.0 g/dL   RDW 15.8 (H) 11.5 - 14.5 %   Platelets 181 150 - 440 K/uL  Troponin I     Status: None   Collection Time: 08/03/15  2:02 PM  Result Value Ref Range   Troponin I <0.03 <0.031 ng/mL    Comment:        NO INDICATION OF MYOCARDIAL INJURY.   Urinalysis complete, with microscopic ([facility] only)     Status: Abnormal   Collection Time: 08/03/15  9:21 PM  Result Value Ref Range   Color, Urine YELLOW (A) YELLOW   APPearance CLEAR (A) CLEAR   Glucose, UA NEGATIVE NEGATIVE mg/dL   Bilirubin Urine NEGATIVE NEGATIVE   Ketones, ur NEGATIVE NEGATIVE mg/dL   Specific Gravity, Urine 1.024 1.005 - 1.030   Hgb urine dipstick 2+ (A) NEGATIVE   pH 5.0 5.0 - 8.0   Protein, ur NEGATIVE NEGATIVE mg/dL   Nitrite NEGATIVE NEGATIVE   Leukocytes, UA NEGATIVE NEGATIVE   RBC / HPF 0-5 0 - 5 RBC/hpf   WBC, UA 0-5 0 - 5 WBC/hpf   Bacteria, UA NONE SEEN NONE SEEN   Squamous Epithelial / LPF 0-5 (A) NONE SEEN   Mucous PRESENT    Hyaline Casts, UA PRESENT   Urinalysis, Complete     Status: Abnormal   Collection Time: 08/16/15 10:21 AM  Result Value Ref Range   Specific Gravity, UA 1.025 1.005 - 1.030   pH, UA 5.0 5.0 - 7.5   Color, UA Yellow Yellow   Appearance Ur Clear Clear   Leukocytes, UA Trace (A) Negative   Protein, UA Trace (A) Negative/Trace   Glucose, UA Negative Negative   Ketones, UA Negative Negative   RBC, UA Trace (A) Negative   Bilirubin, UA  Negative Negative   Urobilinogen, Ur 0.2 0.2 - 1.0 mg/dL   Nitrite, UA Negative Negative   Microscopic Examination See below:   Microscopic Examination     Status: Abnormal   Collection Time: 08/16/15 10:21 AM  Result Value Ref Range   WBC, UA 6-10 (A) 0 -  5 /hpf   RBC, UA None seen 0 -  2 /hpf   Epithelial Cells (non renal) 0-10 0 - 10 /hpf   Mucus, UA Present (A) Not Estab.   Bacteria, UA Few (A) None seen/Few  Urinalysis, Complete     Status: Abnormal   Collection Time: 08/23/15  9:28 AM  Result Value Ref Range   Specific Gravity, UA 1.020 1.005 - 1.030   pH, UA 5.0 5.0 - 7.5   Color, UA Yellow Yellow   Appearance Ur Cloudy (A) Clear   Leukocytes, UA Negative Negative   Protein, UA Negative Negative/Trace   Glucose, UA Negative Negative   Ketones, UA Trace (A) Negative   RBC, UA Trace (A) Negative   Bilirubin, UA Negative Negative   Urobilinogen, Ur 0.2 0.2 - 1.0 mg/dL   Nitrite, UA Negative Negative   Microscopic Examination See below:   Microscopic Examination     Status: Abnormal   Collection Time:  08/23/15  9:28 AM  Result Value Ref Range   WBC, UA 6-10 (A) 0 -  5 /hpf   RBC, UA None seen 0 -  2 /hpf   Epithelial Cells (non renal) 0-10 0 - 10 /hpf   Bacteria, UA Many (A) None seen/Few  POCT Glucose (CBG)     Status: Normal   Collection Time: 09/02/15 10:19 AM  Result Value Ref Range   POC Glucose 163 (A) 70 - 99 mg/dl  POCT UA - Microalbumin     Status: Abnormal   Collection Time: 09/02/15 10:28 AM  Result Value Ref Range   Microalbumin Ur, POC 100 mg/L   Creatinine, POC  mg/dL   Albumin/Creatinine Ratio, Urine, POC    POCT HgB A1C     Status: Abnormal   Collection Time: 09/02/15 10:28 AM  Result Value Ref Range   Hemoglobin A1C 7.0   Urinalysis, Complete     Status: Abnormal   Collection Time: 09/28/15  9:35 AM  Result Value Ref Range   Specific Gravity, UA 1.025 1.005 - 1.030   pH, UA 5.5 5.0 - 7.5   Color, UA Green (A) Yellow   Appearance Ur Clear  Clear   Leukocytes, UA 1+ (A) Negative   Protein, UA Negative Negative/Trace   Glucose, UA Negative Negative   Ketones, UA Negative Negative   RBC, UA 1+ (A) Negative   Bilirubin, UA Negative Negative   Urobilinogen, Ur 0.2 0.2 - 1.0 mg/dL   Nitrite, UA Negative Negative   Microscopic Examination See below:   Microscopic Examination     Status: Abnormal   Collection Time: 09/28/15  9:35 AM  Result Value Ref Range   WBC, UA 11-30 (A) 0 -  5 /hpf   RBC, UA 11-30 (A) 0 -  2 /hpf   Epithelial Cells (non renal) >10 (A) 0 - 10 /hpf   Renal Epithel, UA 0-10 (A) None seen /hpf   Bacteria, UA Few None seen/Few  CULTURE, URINE COMPREHENSIVE     Status: Abnormal   Collection Time: 09/28/15 10:14 AM  Result Value Ref Range   Urine Culture, Comprehensive Final report (A)    Result 1 Klebsiella pneumoniae (A)     Comment: Greater than 100,000 colony forming units per mL   ANTIMICROBIAL SUSCEPTIBILITY Comment     Comment:       ** S = Susceptible; I = Intermediate; R = Resistant **                    P = Positive; N = Negative             MICS are expressed in micrograms per mL    Antibiotic                 RSLT#1    RSLT#2    RSLT#3    RSLT#4 Amoxicillin/Clavulanic Acid    S Ampicillin                     R Cefepime                       S Ceftriaxone                    S Cefuroxime                     S Cephalothin  S Ciprofloxacin                  S Ertapenem                      S Gentamicin                     S Imipenem                       S Levofloxacin                   S Nitrofurantoin                 I Piperacillin                   R Tetracycline                   S Tobramycin                     S Trimethoprim/Sulfa             S   Urinalysis, Complete     Status: Abnormal   Collection Time: 10/05/15 10:42 AM  Result Value Ref Range   Specific Gravity, UA 1.020 1.005 - 1.030   pH, UA 5.5 5.0 - 7.5   Color, UA Yellow Yellow   Appearance Ur Clear  Clear   Leukocytes, UA Negative Negative   Protein, UA Negative Negative/Trace   Glucose, UA Negative Negative   Ketones, UA Negative Negative   RBC, UA Trace (A) Negative   Bilirubin, UA Negative Negative   Urobilinogen, Ur 0.2 0.2 - 1.0 mg/dL   Nitrite, UA Negative Negative   Microscopic Examination See below:   Microscopic Examination     Status: None   Collection Time: 10/05/15 10:42 AM  Result Value Ref Range   WBC, UA None seen 0 -  5 /hpf   RBC, UA 0-2 0 -  2 /hpf   Epithelial Cells (non renal) 0-10 0 - 10 /hpf   Bacteria, UA None seen None seen/Few     Assessment & Plan  1. Hypertension goal BP (blood pressure) < 140/90 Well controled.  2. Controlled type 2 diabetes mellitus with diabetic neuropathy, without long-term current use of insulin ([place]) Not due to HBA1C yet, continue current regimen.  3. Depression, major, recurrent, in partial remission ([place]) Well controled, everything refilled at last visit.  4. Chronic pain of multiple joints Stable. Avoid chronic narcotic medication.  5. Frequent falls Perhaps moving closer to family and having frequent caretaker involvement will be best suited for her needs at this stage of her life. Continue using seated walker.

## 2015-10-12 NOTE — Progress Notes (Signed)
11:12 AM   [name] [birth year] [id number]  Referring provider: Bobetta Lime, MD [address] [place], [place] [address]  Chief Complaint  Patient presents with  . Cystitis    Bladder instillation    HPI: Patient is an 82 year old white female with a history of IC who presents today for rescue installation.      Today, she is experiencing mild suprapubic burning. Her UA is unremarkable. She denies any gross hematuria or other changes in urinary symptoms. She's not had any fevers, chills, nausea or vomiting.  Interval history 10/06/15  Patient reports worsening dysuria. No fevers flank pain or gross hematuria.  Reports last rescue solution helped her symptoms for a few days.  PMH: Past Medical History  Diagnosis Date  . GERD (gastroesophageal reflux disease)   . Vertigo   . Essential hypertension, benign   . Paroxysmal supraventricular tachycardia ([place])   . Pure hypercholesterolemia   . MS (multiple sclerosis) ([place])   . Fibromyalgia   . Diverticulosis   . Diabetes mellitus without complication ([place])   . Osteoarthritis   . Dyslipidemia   . SOB (shortness of breath)     SECONDARY TO REFLUX  . Chest pain     SECONDARY TO GERD  . Peripheral neuropathy (HCC)     MILD  . Cancer ([place])     breast  . Depression   . Chronic interstitial cystitis   . History of fractured rib   . Chronic right hip pain   . History of TIA (transient ischemic attack)   . Carpal tunnel syndrome   . Thyroid nodule   . Hearing loss of left ear   . Vitamin B 12 deficiency   . Protein malnutrition ([place])   . Frequent falls   . Anxiety and depression   . Irritable bowel   . Recurrent UTI     Surgical History: Past Surgical History  Procedure Laterality Date  . Total abdominal hysterectomy w/ bilateral salpingoophorectomy    . Mastectomy Right     DX MASTITIS/NO CANCER.Marland KitchenSALINE IMPLANT  . Cholecystectomy    . Hemorrhoid surgery      WITH RECONSTRUCTION  . Bladder  tacking      X 3  . Eye surgeries      WITH BUCKLE DETACHMENT OF THE RETINA  . Tonsillectomy    . Appendectomy    . Tympanoplasty    . Breast surgery    . Mastectomy    . Breast lumpectomy    . Colonoscopy with propofol N/A 01/21/2015    Procedure: COLONOSCOPY WITH PROPOFOL;  Surgeon: Josefine Class, MD;  Location: [facility] ENDOSCOPY;  Service: Endoscopy;  Laterality: N/A;  . Esophagogastroduodenoscopy N/A 01/21/2015    Procedure: ESOPHAGOGASTRODUODENOSCOPY (EGD);  Surgeon: Josefine Class, MD;  Location: [facility] ENDOSCOPY;  Service: Endoscopy;  Laterality: N/A;    Home Medications:    Medication List       This list is accurate as of: 10/12/15 11:12 AM.  Always use your most recent med list.               [employer] VITAMIN B12 2000 MCG tablet  Generic drug:  cyanocobalamin  Take by mouth.     dicyclomine 10 MG capsule  Commonly known as:  BENTYL  TAKE 1 CAPSULE (10 MG TOTAL) BY MOUTH EVERY 8 (EIGHT) HOURS AS NEEDED.     ferrous sulfate 325 (65 FE) MG tablet  Take 1 tablet (325 mg total) by  mouth daily with breakfast.     FINGERSTIX LANCETS Misc  1 Device by Does not apply route 3 (three) times daily.     glucose blood test strip  1 each by Other route as needed for other. Use as instructed to check blood glucose 3x/day     ibuprofen 200 MG tablet  Commonly known as:  ADVIL,MOTRIN  Take 200 mg by mouth every 6 (six) hours as needed.     meclizine 25 MG tablet  Commonly known as:  ANTIVERT  TAKE ONE TO TWO TABLETS BY MOUTH EVERY 8 HOURS AS NEEDED     metFORMIN 500 MG tablet  Commonly known as:  GLUCOPHAGE  Take 1 tablet (500 mg total) by mouth daily with breakfast.     omeprazole 40 MG capsule  Commonly known as:  PRILOSEC  Take 1 capsule (40 mg total) by mouth daily.     oxyCODONE-acetaminophen 5-325 MG tablet  Commonly known as:  ROXICET  Take 1 tablet by mouth every 6 (six) hours as needed for severe pain.     polyethylene glycol packet  Commonly known as:   MIRALAX / GLYCOLAX  Take 17 g by mouth daily as needed.     PROBIOTIC COLON SUPPORT Caps  Take by mouth.     rosuvastatin 5 MG tablet  Commonly known as:  CRESTOR  Take 0.5 tablets (2.5 mg total) by mouth at bedtime.     sertraline 50 MG tablet  Commonly known as:  ZOLOFT  Take 1 tablet (50 mg total) by mouth daily.     TRUE METRIX AIR GLUCOSE METER Devi  1 Device by Does not apply route 3 (three) times daily.     URIBEL 118 MG Caps  One tablet four times daily as needed with 8 oz of water     VITAMIN D-1000 MAX ST 1000 units tablet  Generic drug:  Cholecalciferol  Take by mouth.        Allergies:  Allergies  Allergen Reactions  . Biaxin [Clarithromycin] Other (See Comments) and Diarrhea  . Copaxone [Glatiramer Acetate]   . Decadron [Dexamethasone] Nausea And Vomiting and Other (See Comments)    Other reaction(s): Muscle Pain Reaction:  Abdominal pain  . Dexamethasone Sodium Phosphate Other (See Comments)  . Diltiazem Hcl Other (See Comments)    Reaction:  Unknown   . Diltiazem Hcl Other (See Comments)  . Flagyl [Metronidazole] Nausea Only and Diarrhea    Other reaction(s): Vomiting  . Gabapentin Other (See Comments)    "burning all over" Reaction:  Unknown   . Iohexol Swelling and Other (See Comments)     Desc: tongue swelling with "IVP dye" sccording to nurses notes with a lumbar myelo-12/09- asm, Onset Date: [id number]  Pts tongue swells.  Clementeen Hoof [Iodinated Diagnostic Agents] Swelling and Other (See Comments)    Passed out  Pts tongue swells.   . Levothyroxine Nausea Only  . Sulfonamide Derivatives Other (See Comments)    Reaction:  Unknown   . Synthroid [Levothyroxine Sodium]   . Copaxone  [Glatiramer] Rash  . Interferon Beta-1a Other (See Comments) and Rash    Reaction:  Unknown     Family History: Family History  Problem Relation Age of Onset  . Arthritis Sister   . Aneurysm Brother   . Heart disease Daughter   . ALS Mother   . Colon cancer      . Kidney disease Neg Hx     Social History:  reports that she  has never smoked. She has never used smokeless tobacco. She reports that she does not drink alcohol or use illicit drugs.  ROS: UROLOGY Frequent Urination?: No Hard to postpone urination?: No Burning/pain with urination?: Yes Get up at night to urinate?: Yes Leakage of urine?: No Urine stream starts and stops?: No Trouble starting stream?: No Do you have to strain to urinate?: No Blood in urine?: No Urinary tract infection?: No Sexually transmitted disease?: No Injury to kidneys or bladder?: No Painful intercourse?: No Weak stream?: No Currently pregnant?: No Vaginal bleeding?: No Last menstrual period?: n  Gastrointestinal Nausea?: No Vomiting?: No Indigestion/heartburn?: Yes Diarrhea?: No Constipation?: Yes  Constitutional Fever: No Night sweats?: Yes Weight loss?: No Fatigue?: Yes  Skin Skin rash/lesions?: No Itching?: No  Eyes Blurred vision?: Yes Double vision?: No  Ears/Nose/Throat Sore throat?: No Sinus problems?: Yes  Hematologic/Lymphatic Swollen glands?: No Easy bruising?: No  Cardiovascular Leg swelling?: No Chest pain?: No  Respiratory Cough?: No Shortness of breath?: No  Endocrine Excessive thirst?: No  Musculoskeletal Back pain?: Yes Joint pain?: Yes  Neurological Headaches?: Yes Dizziness?: Yes  Psychologic Depression?: Yes Anxiety?: Yes  Physical Exam: Blood pressure 156/88, pulse 69, height 5' (1.524 m), weight 157 lb 9.6 oz (71.487 kg). Constitutional: Well nourished. Alert and oriented, No acute distress. HEENT: [place] AT, moist mucus membranes. Trachea midline, no masses. Cardiovascular: No clubbing, cyanosis, or edema. Respiratory: Normal respiratory effort, no increased work of breathing. GI: Abdomen is soft, non tender, non distended, no abdominal masses. Liver and spleen not palpable.  No hernias appreciated.  Stool sample for occult testing is not  indicated.   GU: No CVA tenderness.  No bladder fullness or masses.   Skin: No rashes, bruises or suspicious lesions. Lymph: No cervical or inguinal adenopathy. Neurologic: Grossly intact, no focal deficits, moving all 4 extremities. Psychiatric: Normal mood and affect.   Laboratory Data: Lab Results  Component Value Date   WBC 5.4 08/03/2015   HGB 13.7 08/03/2015   HCT 41.4 08/03/2015   MCV 89.2 08/03/2015   PLT 181 08/03/2015   Lab Results  Component Value Date   CREATININE 0.96 08/03/2015   Lab Results  Component Value Date   HGBA1C 7.0 09/02/2015   Pertinent Imaging CLINICAL DATA: Microscopic hematuria  EXAM: RENAL / URINARY TRACT ULTRASOUND COMPLETE  COMPARISON: Abdominal and pelvic CT scan without contrast dated November 12, 2014.  FINDINGS: Right Kidney:  Length: 9.2 cm. Echogenicity within normal limits. No mass or hydronephrosis visualized.  Left Kidney:  Length: 10.0 cm. There are multiple renal parenchymal cysts on the left. The faintly calcified structure in the posterior aspect of the midpole seen on the previous study is not clearly evident on today's ultrasound. There is a midpole cyst measuring 1.5 x 1.6 x 1.8 cm. There is an upper pole cyst measuring 0.9 cm in diameter. There is a lower pole cyst measuring 1.1 cm in diameter. There is no hydronephrosis.  Bladder:  The urinary bladder is nondistended and poorly evaluated.  IMPRESSION: 1. There are simple appearing cysts in the left kidney. The radiodense structure in the midpole of the right kidney demonstrated on the previous CT scan is not clearly evident on today's ultrasound. 2. The right kidney is unremarkable. 3. The urinary bladder is not well evaluated on today's study. 4. If there is persistent microscopic hematuria and the urinary bladder has been cleared, MRI of the kidneys would be a useful next imaging step in an effort to evaluate the known hyperdense  midpole lesion  on the left which is not evident on today's ultrasound.   Electronically Signed  By: David Martinique M.D.  On: 07/19/2015 14:29          Urinalysis: Results for orders placed or performed in visit on 10/05/15  Microscopic Examination  Result Value Ref Range   WBC, UA None seen 0 -  5 /hpf   RBC, UA 0-2 0 -  2 /hpf   Epithelial Cells (non renal) 0-10 0 - 10 /hpf   Bacteria, UA None seen None seen/Few  Urinalysis, Complete  Result Value Ref Range   Specific Gravity, UA 1.020 1.005 - 1.030   pH, UA 5.5 5.0 - 7.5   Color, UA Yellow Yellow   Appearance Ur Clear Clear   Leukocytes, UA Negative Negative   Protein, UA Negative Negative/Trace   Glucose, UA Negative Negative   Ketones, UA Negative Negative   RBC, UA Trace (A) Negative   Bilirubin, UA Negative Negative   Urobilinogen, Ur 0.2 0.2 - 1.0 mg/dL   Nitrite, UA Negative Negative   Microscopic Examination See below:     Assessment & Plan:    1. Interstitial cystitis:  Patient reports previous urinary symptoms have somewhat improved. She completed treatment for her previous urinary tract infection. Rescue solution postponed today. Urine sent for culture - Urinalysis, Complete -urine culture   No Follow-up on file.  Herbert Moors, [facility] [address] [place], [place] [address] [phone]

## 2015-10-13 ENCOUNTER — Ambulatory Visit: Payer: Commercial Managed Care - HMO | Admitting: Family Medicine

## 2015-10-13 ENCOUNTER — Other Ambulatory Visit: Payer: Self-pay | Admitting: *Deleted

## 2015-10-13 NOTE — Patient Outreach (Signed)
[facility]) Care Management 09/16/2015  [name] [birth year] [id number]  RN Health Coach attempted #2  outreach call to patient.  Patient was unavailable. HIPPA compliance voicemail message was left with return callback number.    [facility] Care Management [phone]

## 2015-10-13 NOTE — Patient Outreach (Signed)
[facility]) Care Management  10/13/2015  [name] [birth year] [id number]  RN Health Coach attempted outreach call to patient.  Patient was unavailable. HIPPA compliance voicemail message was left with return callback number.    [name], BSN, RN [facility] Care Management RN Health Coach Phone: [place] complies with applicable Federal civil rights laws and does not discriminate on the basis of race, color, national origin, age, disability, or sex. Espaol (Spanish)  [place] cumple con las leyes federales de derechos civiles aplicables y no discrimina por motivos de raza, color, nacionalidad, edad, discapacidad o sexo.    Ti?ng Vi?t ([place])  [place] tun th? lu?t dn quy?n hi?n hnh c?a Lin bang v khng phn bi?t ?i x? d?a trn ch?ng t?c, mu da, ngu?n g?c qu?c gia, ? tu?i, khuy?t t?t, ho?c gi?i tnh.    (Arabic)    [place]                      .

## 2015-10-14 ENCOUNTER — Other Ambulatory Visit: Payer: Self-pay | Admitting: *Deleted

## 2015-10-14 ENCOUNTER — Encounter: Payer: Self-pay | Admitting: *Deleted

## 2015-10-14 NOTE — Patient Outreach (Signed)
[facility]) Care Management  [place]  10/14/2015   [name] 24-Sep-1929 [id number]  [place] telephone call to patient.  [place]ip[place] compliance verified. Per patient her blood sugar was 134 yesterday.  Per patient she hadn't taken today. Per patient if she doesn't eat around lunch time she gets real weak.  Patient does not know the signs or symptoms of hypo or hyper glycemia. Patient has poor vision but can see real large print. Patient is writing down her blood sugars but is having difficulty seeing what she had wrote down. I explained to patient to write it with a color marker. This may stand out a little better. Patient primary caregiver is her daughter who is sick also. Her husband is there also but patient stated he can hardly get around. Patient has been having multiple falls. She had broke her glasses and cut her nose in the past and some fractures in the past. Last fall was 2 weeks ago. Patient stated she has fluid in her ear and her balance is off. She uses a rollator walker and a cane. Per patient even when she uses a cart  In the store sometimes she goes side ways and bangs into the aisles. Per patient she is taking her medications as per ordered. This patient has multiple co morbidities. Patient has chronic pain in her side, back, and it hurts when walking. Patient is getting meals on wheels.   Objective:   Current Medications:  Current Outpatient Prescriptions  Medication Sig Dispense Refill  . Blood Glucose Monitoring Suppl (TRUE METRIX AIR GLUCOSE METER) DEVI 1 Device by Does not apply route 3 (three) times daily. 1 Device 0  . Cholecalciferol (VITAMIN D-1000 MAX ST) 1000 UNITS tablet Take by mouth.    . cyanocobalamin (CVS VITAMIN B12) 2000 MCG tablet Take by mouth.    . dicyclomine (BENTYL) 10 MG capsule TAKE 1 CAPSULE (10 MG TOTAL) BY MOUTH EVERY 8 (EIGHT) HOURS AS NEEDED.  1  . ferrous sulfate 325 (65 FE) MG tablet Take 1 tablet (325  mg total) by mouth daily with breakfast. 90 tablet 1  . FINGERSTIX LANCETS MISC 1 Device by Does not apply route 3 (three) times daily. 200 each 1  . glucose blood test strip 1 each by Other route as needed for other. Use as instructed to check blood glucose 3x/day 100 each 5  . ibuprofen (ADVIL,MOTRIN) 200 MG tablet Take 200 mg by mouth every 6 (six) hours as needed.    . meclizine (ANTIVERT) 25 MG tablet TAKE ONE TO TWO TABLETS BY MOUTH EVERY 8 HOURS AS NEEDED 40 tablet 1  . metFORMIN (GLUCOPHAGE) 500 MG tablet Take 1 tablet (500 mg total) by mouth daily with breakfast. 90 tablet 2  . Meth-Hyo-M Bl-Na Phos-Ph Sal (URIBEL) 118 MG CAPS One tablet four times daily as needed with 8 oz of water 25 capsule 3  . omeprazole (PRILOSEC) 40 MG capsule Take 1 capsule (40 mg total) by mouth daily. 90 capsule 2  . oxyCODONE-acetaminophen (ROXICET) 5-325 MG tablet Take 1 tablet by mouth every 6 (six) hours as needed for severe pain. 30 tablet 0  . polyethylene glycol (MIRALAX / GLYCOLAX) packet Take 17 g by mouth daily as needed.    . Probiotic Product (PROBIOTIC COLON SUPPORT) CAPS Take by mouth.    . rosuvastatin (CRESTOR) 5 MG tablet Take 0.5 tablets (2.5 mg total) by mouth at bedtime. 90 tablet 2  . sertraline (ZOLOFT) 50 MG  tablet Take 1 tablet (50 mg total) by mouth daily. 90 tablet 2   No current facility-administered medications for this visit.    Functional Status:  In your present state of health, do you have any difficulty performing the following activities: 10/14/2015 10/12/2015  Hearing? [name]  Vision? Y Y  Difficulty concentrating or making decisions? N N  Walking or climbing stairs? Y N  Dressing or bathing? N N  Doing errands, shopping? Y N  Preparing Food and eating ? - -  Using the Toilet? - -  In the past six months, have you accidently leaked urine? - -  Do you have problems with loss of bowel control? - -  Managing your Medications? - -  Managing your Finances? - -  Housekeeping or  managing your Housekeeping? - -    Fall/Depression Screening: PHQ 2/9 Scores 10/14/2015 10/12/2015 08/12/2015 05/12/2015 04/15/2015 02/21/2015  PHQ - 2 Score 1 1 2  0 0 1  PHQ- 9 Score - - 12 - - -   THN CM Care Plan Problem One        Most Recent Value   Care Plan Problem One  knowledge deficit in self management of diabetes   Role Documenting the Problem One  Health [place] for Problem One  Active   THN Long Term Goal (31-90 days)  Patient will not have any admissions for diabetes in the next 90 days   THN Long Term Goal Start Date  10/14/15   Interventions for Problem One Long Term Goal  RN reminded patient to keep all apoointments with PCP and other caregivers. RN reminded patient the importance of taking all medication as prescribed.   THN CM Short Term Goal #1 (0-30 days)  Patient will verbalized the symptoms of hypo and hyperglycemia within the next 30 days.    THN CM Short Term Goal #1 Start Date  10/14/15   Interventions for Short Term Goal #1  RN discussed hypo and hyperglycemia symptoms with patient. RN will send picture chart of hypo and hyperglycemia symptoms and action plan. RN will follow up with discussion and teach back with a month.   THN CM Short Term Goal #2 (0-30 days)  Patient will report checking blood sugar daily and recording results with the next 30 days   THN CM Short Term Goal #2 Start Date  10/14/15   Interventions for Short Term Goal #2  RN reminded the patient of checking her blood sugar each morning. RN explained to patient to write it down with a color marker since patient having some difficult seeing it.   THN CM Short Term Goal #3 (0-30 days)  Patient will not have any falls within the next 30 days   THN CM Short Term Goal #3 Start Date  10/14/15   Interventions for Short Tern Goal #3  RN discussed with patient about fall safety. Per patient she has fluid in ear that is causing her balance to be off. Patient couldn't remember the name of ENT she had seen. RN  called ENT from patient description and verified . RN called patient back and gave her name and number to set up an appointment       Assessment:  This patient would benefit from [employer] telephonic outreach from education and support for diabetes self management. This patient needs to make and appointment with ENT  Plan:  RN will send patient picture charts on Hypo and hyperglycemia RN will send patient EMMI  information on hypo and hyperglycemia RN will send patient information on falls prevention RN called ENT and it was verified that she had seen him in 2011. Patient was given  ENT name and number to make an appointment RN will send patient EMMI information on Diabetes neuropathy RN will send patient EMMI information on  Diabetes checklist  [name], BSN, [place] Management RN Health Coach Phone: [place] complies with applicable Federal civil rights laws and does not discriminate on the basis of race, color, national origin, age, disability, or sex. Espaol (Spanish)  [place] cumple con las leyes federales de derechos civiles aplicables y no discrimina por motivos de raza, color, nacionalidad, edad, discapacidad o sexo.    Ti[place] Vi[place]t ([place])  [place] tun th? lu?t dn quy?n hi?n hnh c?a Lin bang v khng phn bi?t ?i x? d?a trn ch?ng t?c, mu da, ngu?n g?c qu?c gia, ? tu?i, khuy?t t?t, ho?c gi?i tnh.    (Arabic)    [place]                      .

## 2015-10-16 LAB — CULTURE, URINE COMPREHENSIVE

## 2015-10-17 ENCOUNTER — Telehealth: Payer: Self-pay

## 2015-10-17 NOTE — Telephone Encounter (Signed)
-----   Message from Roda Shutters, [place] sent at 10/17/2015  8:26 AM EDT ----- Please notify patient that her urine culture was negative for infection. We can reschedule her rescue solution whenever convenient for her. Thanks

## 2015-10-17 NOTE — Telephone Encounter (Signed)
Spoke with pt in reference to -ucx. Pt voiced understanding stating she would just prefer to wait until the 20 to receive next tx.

## 2015-10-18 DIAGNOSIS — R42 Dizziness and giddiness: Secondary | ICD-10-CM | POA: Diagnosis not present

## 2015-10-18 DIAGNOSIS — H9042 Sensorineural hearing loss, unilateral, left ear, with unrestricted hearing on the contralateral side: Secondary | ICD-10-CM | POA: Diagnosis not present

## 2015-10-18 DIAGNOSIS — H6522 Chronic serous otitis media, left ear: Secondary | ICD-10-CM | POA: Diagnosis not present

## 2015-10-18 DIAGNOSIS — H9072 Mixed conductive and sensorineural hearing loss, unilateral, left ear, with unrestricted hearing on the contralateral side: Secondary | ICD-10-CM | POA: Diagnosis not present

## 2015-10-21 ENCOUNTER — Ambulatory Visit: Payer: Commercial Managed Care - HMO | Admitting: Radiation Oncology

## 2015-10-23 ENCOUNTER — Emergency Department: Payer: Commercial Managed Care - HMO

## 2015-10-23 ENCOUNTER — Emergency Department
Admission: EM | Admit: 2015-10-23 | Discharge: 2015-10-23 | Disposition: A | Discharge status: Home / Self Care | Payer: Commercial Managed Care - HMO | Attending: Emergency Medicine | Admitting: Emergency Medicine

## 2015-10-23 DIAGNOSIS — M810 Age-related osteoporosis without current pathological fracture: Secondary | ICD-10-CM | POA: Insufficient documentation

## 2015-10-23 DIAGNOSIS — F329 Major depressive disorder, single episode, unspecified: Secondary | ICD-10-CM | POA: Insufficient documentation

## 2015-10-23 DIAGNOSIS — H9192 Unspecified hearing loss, left ear: Secondary | ICD-10-CM | POA: Diagnosis not present

## 2015-10-23 DIAGNOSIS — E7800 Pure hypercholesterolemia, unspecified: Secondary | ICD-10-CM | POA: Diagnosis not present

## 2015-10-23 DIAGNOSIS — I10 Essential (primary) hypertension: Secondary | ICD-10-CM | POA: Insufficient documentation

## 2015-10-23 DIAGNOSIS — E46 Unspecified protein-calorie malnutrition: Secondary | ICD-10-CM | POA: Insufficient documentation

## 2015-10-23 DIAGNOSIS — E1140 Type 2 diabetes mellitus with diabetic neuropathy, unspecified: Secondary | ICD-10-CM | POA: Insufficient documentation

## 2015-10-23 DIAGNOSIS — Z7984 Long term (current) use of oral hypoglycemic drugs: Secondary | ICD-10-CM | POA: Diagnosis not present

## 2015-10-23 DIAGNOSIS — E785 Hyperlipidemia, unspecified: Secondary | ICD-10-CM | POA: Insufficient documentation

## 2015-10-23 DIAGNOSIS — Z8673 Personal history of transient ischemic attack (TIA), and cerebral infarction without residual deficits: Secondary | ICD-10-CM | POA: Insufficient documentation

## 2015-10-23 DIAGNOSIS — Z9011 Acquired absence of right breast and nipple: Secondary | ICD-10-CM | POA: Diagnosis not present

## 2015-10-23 DIAGNOSIS — R0789 Other chest pain: Secondary | ICD-10-CM | POA: Insufficient documentation

## 2015-10-23 DIAGNOSIS — Z79899 Other long term (current) drug therapy: Secondary | ICD-10-CM | POA: Diagnosis not present

## 2015-10-23 DIAGNOSIS — G35 Multiple sclerosis: Secondary | ICD-10-CM | POA: Diagnosis not present

## 2015-10-23 DIAGNOSIS — Z90710 Acquired absence of both cervix and uterus: Secondary | ICD-10-CM | POA: Diagnosis not present

## 2015-10-23 DIAGNOSIS — R0600 Dyspnea, unspecified: Secondary | ICD-10-CM | POA: Diagnosis not present

## 2015-10-23 DIAGNOSIS — I471 Supraventricular tachycardia: Secondary | ICD-10-CM | POA: Diagnosis not present

## 2015-10-23 DIAGNOSIS — J449 Chronic obstructive pulmonary disease, unspecified: Secondary | ICD-10-CM | POA: Diagnosis not present

## 2015-10-23 LAB — CBC
HCT: 40.4 % (ref 35.0–47.0)
HEMOGLOBIN: 13.2 g/dL (ref 12.0–16.0)
MCH: 29.8 pg (ref 26.0–34.0)
MCHC: 32.6 g/dL (ref 32.0–36.0)
MCV: 91.3 fL (ref 80.0–100.0)
Platelets: 165 10*3/uL (ref 150–440)
RBC: 4.42 MIL/uL (ref 3.80–5.20)
RDW: 14.9 % — ABNORMAL HIGH (ref 11.5–14.5)
WBC: 5.3 10*3/uL (ref 3.6–11.0)

## 2015-10-23 LAB — BASIC METABOLIC PANEL
Anion gap: 5 (ref 5–15)
BUN: 19 mg/dL (ref 6–20)
CALCIUM: 9.5 mg/dL (ref 8.9–10.3)
CHLORIDE: 108 mmol/L (ref 101–111)
CO2: 24 mmol/L (ref 22–32)
CREATININE: 0.99 mg/dL (ref 0.44–1.00)
GFR calc non Af Amer: 51 mL/min — ABNORMAL LOW (ref >60)
GFR, EST AFRICAN AMERICAN: 59 mL/min — AB (ref >60)
GLUCOSE: 182 mg/dL — AB (ref 65–99)
Potassium: 4.1 mmol/L (ref 3.5–5.1)
Sodium: 137 mmol/L (ref 135–145)

## 2015-10-23 LAB — TROPONIN I: Troponin I: <0.03 ng/mL (ref <0.031)

## 2015-10-23 MED ORDER — HYDROCODONE-ACETAMINOPHEN 5-325 MG PO TABS
1.0000 | ORAL_TABLET | Freq: Once | ORAL | Status: AC
  Administered 2015-10-23: 1 via ORAL
  Filled 2015-10-23: qty 1

## 2015-10-23 MED ORDER — HYDROCODONE-ACETAMINOPHEN 5-325 MG PO TABS: 2.0000 | ORAL_TABLET | Freq: Four times a day (QID) | ORAL | Status: DC | PRN

## 2015-10-23 MED ORDER — LIDOCAINE 5 % EX PTCH: 1.0000 | MEDICATED_PATCH | Freq: Two times a day (BID) | CUTANEOUS | Status: DC

## 2015-10-23 NOTE — ED Notes (Signed)
Pt states that she falls a lot, states that she fell on Friday landing on her left side, pt states that she has a hx of left sided breast cancer and that is the area that she hit. Pt states that she is having her hearing checked because she falls so often. Pt states that she has pain to breathe.

## 2015-10-23 NOTE — Discharge Instructions (Signed)
Please discuss with primary care doctor need to get an MRI to evaluate the lesion on your spine. Please seek medical attention for any high fevers, chest pain, shortness of breath, change in behavior, persistent vomiting, bloody stool or any other new or concerning symptoms.   Chest Wall Pain Chest wall pain is pain in or around the bones and muscles of your chest. Sometimes, an injury causes this pain. Sometimes, the cause may not be known. This pain may take several weeks or longer to get better. HOME CARE INSTRUCTIONS  Pay attention to any changes in your symptoms. Take these actions to help with your pain:   Rest as told by your health care provider.   Avoid activities that cause pain. These include any activities that use your chest muscles or your abdominal and side muscles to lift heavy items.   If directed, apply ice to the painful area:  Put ice in a plastic bag.  Place a towel between your skin and the bag.  Leave the ice on for 20 minutes, 2-3 times per day.  Take over-the-counter and prescription medicines only as told by your health care provider.  Do not use tobacco products, including cigarettes, chewing tobacco, and e-cigarettes. If you need help quitting, ask your health care provider.  Keep all follow-up visits as told by your health care provider. This is important. SEEK MEDICAL CARE IF:  You have a fever.  Your chest pain becomes worse.  You have new symptoms. SEEK IMMEDIATE MEDICAL CARE IF:  You have nausea or vomiting.  You feel sweaty or light-headed.  You have a cough with phlegm (sputum) or you cough up blood.  You develop shortness of breath.   This information is not intended to replace advice given to you by your health care provider. Make sure you discuss any questions you have with your health care provider.   Document Released: 07/23/2005 Document Revised: 04/13/2015 Document Reviewed: 10/18/2014 Elsevier Interactive Patient Education  [employer].

## 2015-10-23 NOTE — ED Notes (Signed)
Patient transported to CT 

## 2015-10-23 NOTE — ED Provider Notes (Signed)
[facility] Emergency Department Provider Note    ____________________________________________  Time seen: ~1855  I have reviewed the triage vital signs and the nursing notes.   HISTORY  Chief Complaint Chest Pain   History limited by: Not Limited   HPI [name] is a 82 y.o. female who presents to the emergency department today because of concern for left sided chest pain. The patient states that she fell onto the arm of a couch two days ago. She did have pain at that time. She thought it was getting a little better but than it got worse. She describes it as being severe. It has been worse the past day. She tried taking a percocet without significant relief. She denies shortness of breath or fever associated with the pain.    Past Medical History  Diagnosis Date  . GERD (gastroesophageal reflux disease)   . Vertigo   . Essential hypertension, benign   . Paroxysmal supraventricular tachycardia ([place])   . Pure hypercholesterolemia   . MS (multiple sclerosis) ([place])   . Fibromyalgia   . Diverticulosis   . Diabetes mellitus without complication ([place])   . Osteoarthritis   . Dyslipidemia   . SOB (shortness of breath)     SECONDARY TO REFLUX  . Chest pain     SECONDARY TO GERD  . Peripheral neuropathy (HCC)     MILD  . Cancer ([place])     breast  . Depression   . Chronic interstitial cystitis   . History of fractured rib   . Chronic right hip pain   . History of TIA (transient ischemic attack)   . Carpal tunnel syndrome   . Thyroid nodule   . Hearing loss of left ear   . Vitamin B 12 deficiency   . Protein malnutrition ([place])   . Frequent falls   . Anxiety and depression   . Irritable bowel   . Recurrent UTI     Patient Active Problem List   Diagnosis Date Noted  . Diabetic neuropathy with neurologic complication ([place]) [id number]  . Chronic pain of multiple joints 09/02/2015  . Right lower quadrant abdominal pain 08/02/2015  .  Microscopic hematuria 07/12/2015  . Recurrent UTI 04/09/2015  . Dysuria 04/01/2015  . Carpal tunnel syndrome 01/12/2014  . DD (diverticular disease) 01/12/2014  . Carcinoma in situ, breast, ductal 01/12/2014  . H/O surgical procedure 01/12/2014  . H/O right mastectomy 01/12/2014  . Hypercholesteremia 01/12/2014  . DS (disseminated sclerosis) ([place]) 01/12/2014  . Arthritis, degenerative 01/12/2014  . Thyroid nodule 01/12/2014  . Cyanocobalamine deficiency (non anemic) 01/12/2014  . DM (diabetes mellitus) type II controlled, neurological manifestation ([place]) 06/26/2010  . Depression, major, recurrent, in partial remission ([place]) 06/26/2010  . Frequent falls 06/26/2010  . Stricture and stenosis of esophagus 06/26/2010  . Gastro-esophageal reflux disease without esophagitis 06/26/2010  . Fibromyalgia 06/26/2010  . Chronic interstitial cystitis 06/26/2010  . H/O malignant neoplasm of breast 06/26/2010  . Hypertension goal BP (blood pressure) < 140/90 06/01/2009  . H/O paroxysmal supraventricular tachycardia 06/01/2009  . Paroxysmal supraventricular tachycardia ([place]) 06/01/2009  . Irritable bowel syndrome with constipation and diarrhea 06/16/2008  . Diarrhea, functional 06/16/2008  . D (diarrhea) 06/16/2008    Past Surgical History  Procedure Laterality Date  . Total abdominal hysterectomy w/ bilateral salpingoophorectomy    . Mastectomy Right     DX MASTITIS/NO CANCER.Marland KitchenSALINE IMPLANT  . Cholecystectomy    . Hemorrhoid surgery      WITH RECONSTRUCTION  .  Bladder tacking      X 3  . Eye surgeries      WITH BUCKLE DETACHMENT OF THE RETINA  . Tonsillectomy    . Appendectomy    . Tympanoplasty    . Breast surgery    . Mastectomy    . Breast lumpectomy    . Colonoscopy with propofol N/A 01/21/2015    Procedure: COLONOSCOPY WITH PROPOFOL;  Surgeon: Josefine Class, MD;  Location: [facility] ENDOSCOPY;  Service: Endoscopy;  Laterality: N/A;  . Esophagogastroduodenoscopy N/A 01/21/2015     Procedure: ESOPHAGOGASTRODUODENOSCOPY (EGD);  Surgeon: Josefine Class, MD;  Location: [facility] ENDOSCOPY;  Service: Endoscopy;  Laterality: N/A;    Current Outpatient Rx  Name  Route  Sig  Dispense  Refill  . Blood Glucose Monitoring Suppl (TRUE METRIX AIR GLUCOSE METER) DEVI   Does not apply   1 Device by Does not apply route 3 (three) times daily.   1 Device   0   . Cholecalciferol (VITAMIN D-1000 MAX ST) 1000 UNITS tablet   Oral   Take by mouth.         . cyanocobalamin (CVS VITAMIN B12) 2000 MCG tablet   Oral   Take by mouth.         . dicyclomine (BENTYL) 10 MG capsule      TAKE 1 CAPSULE (10 MG TOTAL) BY MOUTH EVERY 8 (EIGHT) HOURS AS NEEDED.      1   . ferrous sulfate 325 (65 FE) MG tablet   Oral   Take 1 tablet (325 mg total) by mouth daily with breakfast.   90 tablet   1   . FINGERSTIX LANCETS MISC   Does not apply   1 Device by Does not apply route 3 (three) times daily.   200 each   1     DX:E11.40 DM   . glucose blood test strip   Other   1 each by Other route as needed for other. Use as instructed to check blood glucose 3x/day   100 each   5   . ibuprofen (ADVIL,MOTRIN) 200 MG tablet   Oral   Take 200 mg by mouth every 6 (six) hours as needed.         . meclizine (ANTIVERT) 25 MG tablet      TAKE ONE TO TWO TABLETS BY MOUTH EVERY 8 HOURS AS NEEDED   40 tablet   1   . metFORMIN (GLUCOPHAGE) 500 MG tablet   Oral   Take 1 tablet (500 mg total) by mouth daily with breakfast.   90 tablet   2   . Meth-Hyo-M Bl-Na Phos-Ph Sal (URIBEL) 118 MG CAPS      One tablet four times daily as needed with 8 oz of water   25 capsule   3   . omeprazole (PRILOSEC) 40 MG capsule   Oral   Take 1 capsule (40 mg total) by mouth daily.   90 capsule   2   . oxyCODONE-acetaminophen (ROXICET) 5-325 MG tablet   Oral   Take 1 tablet by mouth every 6 (six) hours as needed for severe pain.   30 tablet   0   . polyethylene glycol (MIRALAX / GLYCOLAX)  packet   Oral   Take 17 g by mouth daily as needed.         . Probiotic Product (PROBIOTIC COLON SUPPORT) CAPS   Oral   Take by mouth.         Marland Kitchen  rosuvastatin (CRESTOR) 5 MG tablet   Oral   Take 0.5 tablets (2.5 mg total) by mouth at bedtime.   90 tablet   2   . sertraline (ZOLOFT) 50 MG tablet   Oral   Take 1 tablet (50 mg total) by mouth daily.   90 tablet   2     Allergies Biaxin; Copaxone; Decadron; Dexamethasone sodium phosphate; Diltiazem hcl; Diltiazem hcl; Flagyl; Gabapentin; Iohexol; Ivp dye; Levothyroxine; Sulfonamide derivatives; Synthroid; Copaxone ; and Interferon beta-1a  Family History  Problem Relation Age of Onset  . Arthritis Sister   . Aneurysm Brother   . Heart disease Daughter   . ALS Mother   . Colon cancer    . Kidney disease Neg Hx     Social History Social History  Substance Use Topics  . Smoking status: Never Smoker   . Smokeless tobacco: Never Used  . Alcohol Use: No    Review of Systems  Constitutional: Negative for fever. Cardiovascular: Positive for left sided chest pain. Respiratory: Negative for shortness of breath. Gastrointestinal: Negative for abdominal pain, vomiting and diarrhea. Neurological: Negative for headaches, focal weakness or numbness.  10-point ROS otherwise negative.  ____________________________________________   PHYSICAL EXAM:  VITAL SIGNS: ED Triage Vitals  Enc Vitals Group     BP 10/23/15 1804 145/80 mmHg     Pulse Rate 10/23/15 1804 88     Resp 10/23/15 1804 20     Temp 10/23/15 1804 98.2 F (36.8 C)     Temp Source 10/23/15 1804 Oral     SpO2 10/23/15 1804 98 %     Weight 10/23/15 1804 155 lb (70.308 kg)     Height 10/23/15 1804 5\' 2"  (1.575 m)     Head Cir --      Peak Flow --      Pain Score 10/23/15 1805 10   Constitutional: Alert and oriented. Well appearing and in no distress. Eyes: Conjunctivae are normal. PERRL. Normal extraocular movements. ENT   Head: Normocephalic and  atraumatic.   Nose: No congestion/rhinnorhea.   Mouth/Throat: Mucous membranes are moist.   Neck: No stridor. Hematological/Lymphatic/Immunilogical: No cervical lymphadenopathy. Cardiovascular: Normal rate, regular rhythm.  No murmurs, rubs, or gallops. Tender to palpation under the left breast.  Respiratory: Normal respiratory effort without tachypnea nor retractions. Breath sounds are clear and equal bilaterally. No wheezes/rales/rhonchi. Gastrointestinal: Soft and nontender. No distention. There is no CVA tenderness. Genitourinary: Deferred Musculoskeletal: Normal range of motion in all extremities. No joint effusions.  No lower extremity tenderness nor edema. Neurologic:  Normal speech and language. No gross focal neurologic deficits are appreciated.  Skin:  Skin is warm, dry and intact. No rash noted. Psychiatric: Mood and affect are normal. Speech and behavior are normal. Patient exhibits appropriate insight and judgment.  ____________________________________________    LABS (pertinent positives/negatives)  Labs Reviewed  BASIC METABOLIC PANEL - Abnormal; Notable for the following:    Glucose, Bld 182 (*)    GFR calc non Af Amer 51 (*)    GFR calc Af Amer 59 (*)    All other components within normal limits  CBC - Abnormal; Notable for the following:    RDW 14.9 (*)    All other components within normal limits  TROPONIN I     ____________________________________________   EKG  I, Nance Pear, attending physician, personally viewed and interpreted this EKG  EKG Time: 1814 Rate: 85 Rhythm: accelerated junctional rhythm Axis: normal Intervals: qtc 418 QRS: narrow ST changes: no  st elevation Impression: abnormal ekg   ____________________________________________    RADIOLOGY  CXR IMPRESSION: 1. No acute abnormality. 2. Stable mild changes of COPD.  CT chest  IMPRESSION: 1. Increased sclerosis inferiorly in the T7 vertebral body  with equivocal inferior endplate irregularity. This could be reactive findings from a subtle compression fracture air. Osseous metastatic disease from breast cancer is not totally excluded. Consider thoracic spine MRI with and without contrast for further characterization. 2. I do not observe a definite rib fracture. 3. Coronary atherosclerosis. 4. Stable 3 mm nodule in the superior segment left lower lobe, likely incidental, may merit observation. This appears unchanged from the earliest available comparison of 01/09/2015. 5. Stable right hepatic lobe fluid density lesion and stable faintly rim calcified splenic lesion, likely benign.  ____________________________________________   PROCEDURES  Procedure(s) performed: None  Critical Care performed: No  ____________________________________________   INITIAL IMPRESSION / ASSESSMENT AND PLAN / ED COURSE  Pertinent labs & imaging results that were available during my care of the patient were reviewed by me and considered in my medical decision making (see chart for details).  Patient presented to the emergency department today because of concerns for left chest pain after a fall. Chest x-ray and CT chest did not show any acute rib fractures. No overlying rash. At this point think patient likely suffered contusions. Patient states that the [place] did seem to help. Will discharge with Norco and lidocaine patch. In addition I discussed with the patient the lesion of the T7 seen on the CT scan. I did discuss with her could be a potential fracture or cancerous lesion. I discussed with her the need to follow up with primary care. For possible MRI.  ____________________________________________   FINAL CLINICAL IMPRESSION(S) / ED DIAGNOSES  Final diagnoses:  Chest wall pain     Nance Pear, MD 10/23/15 2010

## 2015-10-24 ENCOUNTER — Telehealth: Payer: Self-pay | Admitting: Urology

## 2015-10-24 ENCOUNTER — Encounter: Payer: Self-pay | Admitting: Urology

## 2015-10-24 ENCOUNTER — Ambulatory Visit (INDEPENDENT_AMBULATORY_CARE_PROVIDER_SITE_OTHER): Payer: Commercial Managed Care - HMO | Admitting: Urology

## 2015-10-24 VITALS — BP 153/52 | HR 76 | Ht 60.0 in | Wt 157.0 lb

## 2015-10-24 DIAGNOSIS — M539 Dorsopathy, unspecified: Secondary | ICD-10-CM

## 2015-10-24 DIAGNOSIS — N3010 Interstitial cystitis (chronic) without hematuria: Secondary | ICD-10-CM | POA: Diagnosis not present

## 2015-10-24 LAB — URINALYSIS, COMPLETE
BILIRUBIN UA: NEGATIVE
GLUCOSE, UA: NEGATIVE
KETONES UA: NEGATIVE
Nitrite, UA: NEGATIVE
PH UA: 5.5 (ref 5.0–7.5)
SPEC GRAV UA: 1.025 (ref 1.005–1.030)
UUROB: 0.2 mg/dL (ref 0.2–1.0)

## 2015-10-24 LAB — MICROSCOPIC EXAMINATION

## 2015-10-24 MED ORDER — SODIUM BICARBONATE 8.4 % IV SOLN
11.0000 mL | Freq: Once | INTRAVENOUS | Status: AC
  Administered 2015-10-24: 11 mL

## 2015-10-24 NOTE — Progress Notes (Signed)
9:39 PM   [name] Apr 15, 1930 [id number]  Referring provider: Bobetta Lime, MD No address on file  Chief Complaint  Patient presents with  . Cystitis    Rescue solution    HPI: Patient is an 82 year old white female with a history of IC who presents today for rescue installation.      Since her last visit with [place], she has experienced a fall due to a loss of balance.  She was seen in [facility] ED and a CT chest noted increased sclerosis inferiorly in the T7 vertebral body with equivocal inferior endplate irregularity. This could be reactive findings from a subtle compression fracture air. Osseous metastatic disease from breast cancer is not totally excluded. Consider thoracic spine MRI with and without contrast for further characterization.  Her PCP has recently relocated and she has not found another at this time.  Today, she is experiencing mild suprapubic burning.  Her UA is unremarkable today.  She denies any gross hematuria or other changes in urinary symptoms.  She's not had any fevers, chills, nausea or vomiting.   PMH: Past Medical History  Diagnosis Date  . GERD (gastroesophageal reflux disease)   . Vertigo   . Essential hypertension, benign   . Paroxysmal supraventricular tachycardia ([place])   . Pure hypercholesterolemia   . MS (multiple sclerosis) ([place])   . Fibromyalgia   . Diverticulosis   . Diabetes mellitus without complication ([place])   . Osteoarthritis   . Dyslipidemia   . SOB (shortness of breath)     SECONDARY TO REFLUX  . Chest pain     SECONDARY TO GERD  . Peripheral neuropathy (HCC)     MILD  . Cancer ([place])     breast  . Depression   . Chronic interstitial cystitis   . History of fractured rib   . Chronic right hip pain   . History of TIA (transient ischemic attack)   . Carpal tunnel syndrome   . Thyroid nodule   . Hearing loss of left ear   . Vitamin B 12 deficiency   . Protein malnutrition ([place])   . Frequent falls   . Anxiety and  depression   . Irritable bowel   . Recurrent UTI     Surgical History: Past Surgical History  Procedure Laterality Date  . Total abdominal hysterectomy w/ bilateral salpingoophorectomy    . Mastectomy Right     DX MASTITIS/NO CANCER.Marland KitchenSALINE IMPLANT  . Cholecystectomy    . Hemorrhoid surgery      WITH RECONSTRUCTION  . Bladder tacking      X 3  . Eye surgeries      WITH BUCKLE DETACHMENT OF THE RETINA  . Tonsillectomy    . Appendectomy    . Tympanoplasty    . Breast surgery    . Mastectomy    . Breast lumpectomy    . Colonoscopy with propofol N/A 01/21/2015    Procedure: COLONOSCOPY WITH PROPOFOL;  Surgeon: Josefine Class, MD;  [facility] ENDOSCOPY;  Service: Endoscopy;  Laterality: N/A;  . Esophagogastroduodenoscopy N/A 01/21/2015    Procedure: ESOPHAGOGASTRODUODENOSCOPY (EGD);  Surgeon: Josefine Class, MD;  Location: [facility] ENDOSCOPY;  Service: Endoscopy;  Laterality: N/A;    Home Medications:    Medication List       This list is accurate as of: 10/24/15  9:39 PM.  Always use your most recent med list.  CVS VITAMIN B12 2000 MCG tablet  Generic drug:  cyanocobalamin  Take by mouth.     dicyclomine 10 MG capsule  Commonly known as:  BENTYL  TAKE 1 CAPSULE (10 MG TOTAL) BY MOUTH EVERY 8 (EIGHT) HOURS AS NEEDED.     ferrous sulfate 325 (65 FE) MG tablet  Take 1 tablet (325 mg total) by mouth daily with breakfast.     FINGERSTIX LANCETS Misc  1 Device by Does not apply route 3 (three) times daily.     fluticasone 50 MCG/ACT nasal spray  Commonly known as:  FLONASE  SPRAY 2 SPRAYS EACH INTO BOTH NOSTRILS ONCE DAILY EACH NIGHT.     glucose blood test strip  1 each by Other route as needed for other. Use as instructed to check blood glucose 3x/day     HYDROcodone-acetaminophen 5-325 MG tablet  Commonly known as:  NORCO/VICODIN  Take 2 tablets by mouth every 6 (six) hours as needed for severe pain.     ibuprofen 200 MG tablet  Commonly  known as:  ADVIL,MOTRIN  Take 200 mg by mouth every 6 (six) hours as needed.     lidocaine 5 %  Commonly known as:  LIDODERM  Place 1 patch onto the skin every 12 (twelve) hours. Remove & Discard patch within 12 hours or as directed by MD     meclizine 25 MG tablet  Commonly known as:  ANTIVERT  TAKE ONE TO TWO TABLETS BY MOUTH EVERY 8 HOURS AS NEEDED     metFORMIN 500 MG tablet  Commonly known as:  GLUCOPHAGE  Take 1 tablet (500 mg total) by mouth daily with breakfast.     omeprazole 40 MG capsule  Commonly known as:  PRILOSEC  Take 1 capsule (40 mg total) by mouth daily.     oxyCODONE-acetaminophen 5-325 MG tablet  Commonly known as:  ROXICET  Take 1 tablet by mouth every 6 (six) hours as needed for severe pain.     polyethylene glycol packet  Commonly known as:  MIRALAX / GLYCOLAX  Take 17 g by mouth daily as needed.     PROBIOTIC COLON SUPPORT Caps  Take by mouth.     rosuvastatin 5 MG tablet  Commonly known as:  CRESTOR  Take 0.5 tablets (2.5 mg total) by mouth at bedtime.     sertraline 50 MG tablet  Commonly known as:  ZOLOFT  Take 1 tablet (50 mg total) by mouth daily.     TRUE METRIX AIR GLUCOSE METER Devi  1 Device by Does not apply route 3 (three) times daily.     URIBEL 118 MG Caps  One tablet four times daily as needed with 8 oz of water     VITAMIN D-1000 MAX ST 1000 units tablet  Generic drug:  Cholecalciferol  Take by mouth.     Vitamins A & D 5000-400 units Caps  Reported on 10/24/2015        Allergies:  Allergies  Allergen Reactions  . Biaxin [Clarithromycin] Other (See Comments) and Diarrhea  . Copaxone [Glatiramer Acetate]   . Decadron [Dexamethasone] Nausea And Vomiting and Other (See Comments)    Other reaction(s): Muscle Pain Reaction:  Abdominal pain  . Dexamethasone Sodium Phosphate Other (See Comments)  . Diltiazem Hcl Other (See Comments)    Reaction:  Unknown   . Diltiazem Hcl Other (See Comments)  . Flagyl [Metronidazole]  Nausea Only and Diarrhea    Other reaction(s): Vomiting  . Gabapentin Other (See Comments)    "  burning all over" Reaction:  Unknown   . Iohexol Swelling and Other (See Comments)     Desc: tongue swelling with "IVP dye" sccording to nurses notes with a lumbar myelo-12/09- asm, Onset Date: [id number]  Pts tongue swells.  Clementeen Hoof [Iodinated Diagnostic Agents] Swelling and Other (See Comments)    Passed out  Pts tongue swells.   . Levothyroxine Nausea Only  . Sulfonamide Derivatives Other (See Comments)    Reaction:  Unknown   . Synthroid [Levothyroxine Sodium]   . Copaxone  [Glatiramer] Rash  . Interferon Beta-1a Other (See Comments) and Rash    Reaction:  Unknown     Family History: Family History  Problem Relation Age of Onset  . Arthritis Sister   . Aneurysm Brother   . Heart disease Daughter   . ALS Mother   . Colon cancer    . Kidney disease Neg Hx     Social History:  reports that she has never smoked. She has never used smokeless tobacco. She reports that she does not drink alcohol or use illicit drugs.  ROS: UROLOGY Frequent Urination?: No Hard to postpone urination?: No Burning/pain with urination?: Yes Get up at night to urinate?: No Leakage of urine?: No Urine stream starts and stops?: No Trouble starting stream?: No Do you have to strain to urinate?: No Blood in urine?: No Urinary tract infection?: No Sexually transmitted disease?: No Injury to kidneys or bladder?: No Painful intercourse?: No Weak stream?: No Currently pregnant?: No Vaginal bleeding?: No Last menstrual period?: n  Gastrointestinal Nausea?: No Vomiting?: No Indigestion/heartburn?: Yes Diarrhea?: Yes Constipation?: Yes  Constitutional Fever: No Night sweats?: No Weight loss?: No Fatigue?: Yes  Skin Skin rash/lesions?: No Itching?: No  Eyes Blurred vision?: Yes Double vision?: No  Ears/Nose/Throat Sore throat?: Yes Sinus problems?: Yes  Hematologic/Lymphatic Swollen  glands?: No Easy bruising?: No  Cardiovascular Leg swelling?: No Chest pain?: Yes  Respiratory Cough?: No Shortness of breath?: No  Endocrine Excessive thirst?: No  Musculoskeletal Back pain?: Yes Joint pain?: Yes  Neurological Headaches?: Yes Dizziness?: Yes  Psychologic Depression?: Yes Anxiety?: Yes  Physical Exam: Blood pressure 153/52, pulse 76, height 5' (1.524 m), weight 157 lb (71.215 kg). Constitutional: Well nourished. Alert and oriented, No acute distress. HEENT: [place] AT, moist mucus membranes. Trachea midline, no masses. Cardiovascular: No clubbing, cyanosis, or edema. Respiratory: Normal respiratory effort, no increased work of breathing. GI: Abdomen is soft, non tender, non distended, no abdominal masses. Liver and spleen not palpable.  No hernias appreciated.  Stool sample for occult testing is not indicated.   GU: No CVA tenderness.  No bladder fullness or masses.   Skin: No rashes, bruises or suspicious lesions. Lymph: No cervical or inguinal adenopathy. Neurologic: Grossly intact, no focal deficits, moving all 4 extremities. Psychiatric: Normal mood and affect.  Laboratory Data: Lab Results  Component Value Date   WBC 5.3 10/23/2015   HGB 13.2 10/23/2015   HCT 40.4 10/23/2015   MCV 91.3 10/23/2015   PLT 165 10/23/2015   Lab Results  Component Value Date   CREATININE 0.99 10/23/2015   Lab Results  Component Value Date   HGBA1C 7.0 09/02/2015   Pertinent Imaging CLINICAL DATA: Microscopic hematuria  EXAM: RENAL / URINARY TRACT ULTRASOUND COMPLETE  COMPARISON: Abdominal and pelvic CT scan without contrast dated November 12, 2014.  FINDINGS: Right Kidney:  Length: 9.2 cm. Echogenicity within normal limits. No mass or hydronephrosis visualized.  Left Kidney:  Length: 10.0 cm. There are multiple  renal parenchymal cysts on the left. The faintly calcified structure in the posterior aspect of the midpole seen on the previous  study is not clearly evident on today's ultrasound. There is a midpole cyst measuring 1.5 x 1.6 x 1.8 cm. There is an upper pole cyst measuring 0.9 cm in diameter. There is a lower pole cyst measuring 1.1 cm in diameter. There is no hydronephrosis.  Bladder:  The urinary bladder is nondistended and poorly evaluated.  IMPRESSION: 1. There are simple appearing cysts in the left kidney. The radiodense structure in the midpole of the right kidney demonstrated on the previous CT scan is not clearly evident on today's ultrasound. 2. The right kidney is unremarkable. 3. The urinary bladder is not well evaluated on today's study. 4. If there is persistent microscopic hematuria and the urinary bladder has been cleared, MRI of the kidneys would be a useful next imaging step in an effort to evaluate the known hyperdense midpole lesion on the left which is not evident on today's ultrasound.   Electronically Signed  By: David Martinique M.D.  On: 07/19/2015 14:29          Urinalysis: Results for orders placed or performed in visit on 10/24/15  Microscopic Examination  Result Value Ref Range   WBC, UA 0-5 0 -  5 /hpf   RBC, UA 0-2 0 -  2 /hpf   Epithelial Cells (non renal) 0-10 0 - 10 /hpf   Bacteria, UA Many (A) None seen/Few  Urinalysis, Complete  Result Value Ref Range   Specific Gravity, UA 1.025 1.005 - 1.030   pH, UA 5.5 5.0 - 7.5   Color, UA Green (A) Yellow   Appearance Ur Clear Clear   Leukocytes, UA Trace (A) Negative   Protein, UA Trace (A) Negative/Trace   Glucose, UA Negative Negative   Ketones, UA Negative Negative   RBC, UA 1+ (A) Negative   Bilirubin, UA Negative Negative   Urobilinogen, Ur 0.2 0.2 - 1.0 mg/dL   Nitrite, UA Negative Negative   Microscopic Examination See below:     Assessment & Plan:    1. Interstitial cystitis:  Patient received her rescue solution today.  She will return next week for her next treatment.    2. Suspicious lesions  in the T 7 vertebral body:   Patient was seen in the emergency room after suffering a fall. She does have a history of breast cancer and an increased neurosis was seen inferiorly in the T7 vertebral body with the equivocal endplate rregularity. Osseous prostatic disease from the breast cancer was not totally excluded.  Primary care physician has recently relocated and therefore she is without a primary care provider.  Therefore, I will refer her onto medical oncology for further workup of this lesion.   Return in about 1 week (around 10/31/2015) for UA and rescue solution and refer to oncology for further evaluation of thoracic spine.  Zara Council, [facility] [address] [place], [place] [address] [phone]

## 2015-10-24 NOTE — Progress Notes (Signed)
In and Out Catheterization  Patient is present today for a Rescue solution for cystitis but is unable to void.  Patient was cleaned and prepped in a sterile fashion with betadine and Lidocaine 2% jelly was put on the tip of the catheter and a 14FR cath was inserted into the urethra,  no complications were noted , 76ml of urine return was noted, urine was blue in color because of patient taking Uribel. A clean urine sample was collected for determining if patient can have her Rescue solution today. Bladder was drained  and catheter was removed with out difficulty.    Preformed by: Lyndee Hensen CMA  Bladder Rescue Solution Instillation  Due to interstitial cystitis patient is present today for a Rescue Solution Treatment.  Patient was cleaned and prepped in a sterile fashion with betadine and lidocaine 2% jelly was instilled into the urethra.  A 14 FR catheter was inserted, urine return was noted 39ml, urine was blue in color.  Instilled a solution consisting of 42ml of Sodium Bicarb, 2 ml Lidocaine and 1 ml of Heparin. The catheter was then removed. Patient tolerated well, no complications were noted.   Performed by: Lyndee Hensen CMA  Follow up/ Additional Notes: One week

## 2015-10-25 ENCOUNTER — Telehealth: Payer: Self-pay | Admitting: Urology

## 2015-10-25 NOTE — Telephone Encounter (Signed)
Patient has an UTI.  Please call her in Augmentin 875/125, one tablet twice daily, # 14.

## 2015-10-26 NOTE — Telephone Encounter (Signed)
LMOM that patient has a UTI and that I will call her medication into [facility] [address] for her. She is to take med one tablet twice daily for 7 days. If she has any questions please call office. Med called in to pharmacy.

## 2015-10-26 NOTE — Telephone Encounter (Signed)
Spoke with patient to make sure she received the earlier message. Explained to patient again results and that medication had been called in to pharmacy. Patient understands.

## 2015-10-31 ENCOUNTER — Encounter: Payer: Self-pay | Admitting: Emergency Medicine

## 2015-10-31 ENCOUNTER — Ambulatory Visit: Payer: Commercial Managed Care - HMO | Admitting: Urology

## 2015-10-31 ENCOUNTER — Emergency Department
Admission: EM | Admit: 2015-10-31 | Discharge: 2015-10-31 | Disposition: A | Discharge status: Home / Self Care | Payer: Commercial Managed Care - HMO | Attending: Emergency Medicine | Admitting: Emergency Medicine

## 2015-10-31 DIAGNOSIS — E785 Hyperlipidemia, unspecified: Secondary | ICD-10-CM | POA: Diagnosis not present

## 2015-10-31 DIAGNOSIS — Z79891 Long term (current) use of opiate analgesic: Secondary | ICD-10-CM | POA: Insufficient documentation

## 2015-10-31 DIAGNOSIS — F329 Major depressive disorder, single episode, unspecified: Secondary | ICD-10-CM | POA: Diagnosis not present

## 2015-10-31 DIAGNOSIS — E1140 Type 2 diabetes mellitus with diabetic neuropathy, unspecified: Secondary | ICD-10-CM | POA: Diagnosis not present

## 2015-10-31 DIAGNOSIS — K625 Hemorrhage of anus and rectum: Secondary | ICD-10-CM

## 2015-10-31 DIAGNOSIS — Z90710 Acquired absence of both cervix and uterus: Secondary | ICD-10-CM | POA: Insufficient documentation

## 2015-10-31 DIAGNOSIS — E46 Unspecified protein-calorie malnutrition: Secondary | ICD-10-CM | POA: Insufficient documentation

## 2015-10-31 DIAGNOSIS — G8929 Other chronic pain: Secondary | ICD-10-CM | POA: Diagnosis not present

## 2015-10-31 DIAGNOSIS — Z7984 Long term (current) use of oral hypoglycemic drugs: Secondary | ICD-10-CM | POA: Diagnosis not present

## 2015-10-31 DIAGNOSIS — R197 Diarrhea, unspecified: Secondary | ICD-10-CM | POA: Diagnosis not present

## 2015-10-31 DIAGNOSIS — Z79899 Other long term (current) drug therapy: Secondary | ICD-10-CM | POA: Diagnosis not present

## 2015-10-31 DIAGNOSIS — E7800 Pure hypercholesterolemia, unspecified: Secondary | ICD-10-CM | POA: Insufficient documentation

## 2015-10-31 DIAGNOSIS — Z8673 Personal history of transient ischemic attack (TIA), and cerebral infarction without residual deficits: Secondary | ICD-10-CM | POA: Diagnosis not present

## 2015-10-31 DIAGNOSIS — M25551 Pain in right hip: Secondary | ICD-10-CM | POA: Insufficient documentation

## 2015-10-31 DIAGNOSIS — I10 Essential (primary) hypertension: Secondary | ICD-10-CM | POA: Insufficient documentation

## 2015-10-31 DIAGNOSIS — I471 Supraventricular tachycardia: Secondary | ICD-10-CM | POA: Insufficient documentation

## 2015-10-31 DIAGNOSIS — G35 Multiple sclerosis: Secondary | ICD-10-CM | POA: Insufficient documentation

## 2015-10-31 DIAGNOSIS — N3010 Interstitial cystitis (chronic) without hematuria: Secondary | ICD-10-CM | POA: Insufficient documentation

## 2015-10-31 DIAGNOSIS — R296 Repeated falls: Secondary | ICD-10-CM | POA: Diagnosis not present

## 2015-10-31 DIAGNOSIS — Z853 Personal history of malignant neoplasm of breast: Secondary | ICD-10-CM | POA: Insufficient documentation

## 2015-10-31 DIAGNOSIS — M1990 Unspecified osteoarthritis, unspecified site: Secondary | ICD-10-CM | POA: Diagnosis not present

## 2015-10-31 LAB — CBC
HEMATOCRIT: 40.9 % (ref 35.0–47.0)
HEMOGLOBIN: 13.6 g/dL (ref 12.0–16.0)
MCH: 30.3 pg (ref 26.0–34.0)
MCHC: 33.3 g/dL (ref 32.0–36.0)
MCV: 91 fL (ref 80.0–100.0)
PLATELETS: 190 10*3/uL (ref 150–440)
RBC: 4.49 MIL/uL (ref 3.80–5.20)
RDW: 14.9 % — AB (ref 11.5–14.5)
WBC: 4.3 10*3/uL (ref 3.6–11.0)

## 2015-10-31 LAB — COMPREHENSIVE METABOLIC PANEL
ALBUMIN: 4.1 g/dL (ref 3.5–5.0)
ALK PHOS: 45 U/L (ref 38–126)
ALT: 21 U/L (ref 14–54)
ANION GAP: 9 (ref 5–15)
AST: 13 U/L — ABNORMAL LOW (ref 15–41)
BILIRUBIN TOTAL: 0.7 mg/dL (ref 0.3–1.2)
BUN: 20 mg/dL (ref 6–20)
CALCIUM: 9.7 mg/dL (ref 8.9–10.3)
CO2: 23 mmol/L (ref 22–32)
Chloride: 107 mmol/L (ref 101–111)
Creatinine, Ser: 0.94 mg/dL (ref 0.44–1.00)
GFR calc non Af Amer: 55 mL/min — ABNORMAL LOW (ref >60)
GLUCOSE: 152 mg/dL — AB (ref 65–99)
POTASSIUM: 4.2 mmol/L (ref 3.5–5.1)
Sodium: 139 mmol/L (ref 135–145)
TOTAL PROTEIN: 7.2 g/dL (ref 6.5–8.1)

## 2015-10-31 LAB — TYPE AND SCREEN
ABO/RH(D): A POS
Antibody Screen: NEGATIVE

## 2015-10-31 LAB — ABO/RH: ABO/RH(D): A POS

## 2015-10-31 MED ORDER — LOPERAMIDE HCL 2 MG PO CAPS
4.0000 mg | ORAL_CAPSULE | Freq: Once | ORAL | Status: AC
  Administered 2015-10-31: 4 mg via ORAL
  Filled 2015-10-31: qty 2

## 2015-10-31 MED ORDER — DIPHENOXYLATE-ATROPINE 2.5-0.025 MG PO TABS: 1.0000 | ORAL_TABLET | Freq: Four times a day (QID) | ORAL | Status: DC | PRN

## 2015-10-31 MED ORDER — DICYCLOMINE HCL 20 MG PO TABS
20.0000 mg | ORAL_TABLET | Freq: Once | ORAL | Status: AC
Start: 2015-10-31 — End: 2015-10-31
  Administered 2015-10-31: 20 mg via ORAL
  Filled 2015-10-31: qty 1

## 2015-10-31 MED ORDER — HYDROCORTISONE ACETATE 25 MG RE SUPP: 25.0000 mg | Freq: Two times a day (BID) | RECTAL | Status: DC

## 2015-10-31 NOTE — ED Notes (Signed)
Unable to obtain signature due to e-signature not working. Pt given written and verbal discharge instructions. Verbalized understanding.

## 2015-10-31 NOTE — ED Provider Notes (Signed)
[facility] Emergency Department Provider Note     Time seen: ----------------------------------------- 3:15 PM on 10/31/2015 -----------------------------------------    I have reviewed the triage vital signs and the nursing notes.   HISTORY  Chief Complaint Diarrhea and Rectal Bleeding    HPI [name] is a 82 y.o. female who presents to ER with complaints of diarrhea and rectal bleeding. She denies any vomiting, she does report a history of hemorrhoids and states her rectal area is very painful.Patient was told the past she has internal and external hemorrhoids, she had an episode diarrhea just prior to arrival. Patient is alert border one episode of seeing blood in the toilet.   Past Medical History  Diagnosis Date  . GERD (gastroesophageal reflux disease)   . Vertigo   . Essential hypertension, benign   . Paroxysmal supraventricular tachycardia ([place])   . Pure hypercholesterolemia   . MS (multiple sclerosis) ([place])   . Fibromyalgia   . Diverticulosis   . Diabetes mellitus without complication ([place])   . Osteoarthritis   . Dyslipidemia   . SOB (shortness of breath)     SECONDARY TO REFLUX  . Chest pain     SECONDARY TO GERD  . Peripheral neuropathy (HCC)     MILD  . Cancer ([place])     breast  . Depression   . Chronic interstitial cystitis   . History of fractured rib   . Chronic right hip pain   . History of TIA (transient ischemic attack)   . Carpal tunnel syndrome   . Thyroid nodule   . Hearing loss of left ear   . Vitamin B 12 deficiency   . Protein malnutrition ([place])   . Frequent falls   . Anxiety and depression   . Irritable bowel   . Recurrent UTI     Patient Active Problem List   Diagnosis Date Noted  . Interstitial cystitis 10/24/2015  . Diabetic neuropathy with neurologic complication ([place]) [id number]  . Chronic pain of multiple joints 09/02/2015  . Right lower quadrant abdominal pain 08/02/2015  . Microscopic  hematuria 07/12/2015  . Recurrent UTI 04/09/2015  . Dysuria 04/01/2015  . Carpal tunnel syndrome 01/12/2014  . DD (diverticular disease) 01/12/2014  . Carcinoma in situ, breast, ductal 01/12/2014  . H/O surgical procedure 01/12/2014  . H/O right mastectomy 01/12/2014  . Hypercholesteremia 01/12/2014  . DS (disseminated sclerosis) ([place]) 01/12/2014  . Arthritis, degenerative 01/12/2014  . Thyroid nodule 01/12/2014  . Cyanocobalamine deficiency (non anemic) 01/12/2014  . DM (diabetes mellitus) type II controlled, neurological manifestation ([place]) 06/26/2010  . Depression, major, recurrent, in partial remission ([place]) 06/26/2010  . Frequent falls 06/26/2010  . Stricture and stenosis of esophagus 06/26/2010  . Gastro-esophageal reflux disease without esophagitis 06/26/2010  . Fibromyalgia 06/26/2010  . Chronic interstitial cystitis 06/26/2010  . H/O malignant neoplasm of breast 06/26/2010  . Hypertension goal BP (blood pressure) < 140/90 06/01/2009  . H/O paroxysmal supraventricular tachycardia 06/01/2009  . Paroxysmal supraventricular tachycardia ([place]) 06/01/2009  . Irritable bowel syndrome with constipation and diarrhea 06/16/2008  . Diarrhea, functional 06/16/2008  . D (diarrhea) 06/16/2008    Past Surgical History  Procedure Laterality Date  . Total abdominal hysterectomy w/ bilateral salpingoophorectomy    . Mastectomy Right     DX MASTITIS/NO CANCER.Marland KitchenSALINE IMPLANT  . Cholecystectomy    . Hemorrhoid surgery      WITH RECONSTRUCTION  . Bladder tacking      X 3  . Eye surgeries  WITH BUCKLE DETACHMENT OF THE RETINA  . Tonsillectomy    . Appendectomy    . Tympanoplasty    . Breast surgery    . Mastectomy    . Breast lumpectomy    . Colonoscopy with propofol N/A 01/21/2015    Procedure: COLONOSCOPY WITH PROPOFOL;  Surgeon: Josefine Class, MD;  Location[facility] ENDOSCOPY;  Service: Endoscopy;  Laterality: N/A;  . Esophagogastroduodenoscopy N/A 01/21/2015    Procedure:  ESOPHAGOGASTRODUODENOSCOPY (EGD);  Surgeon: Josefine Class, MD;  Location: [facility] ENDOSCOPY;  Service: Endoscopy;  Laterality: N/A;    Allergies Biaxin; Copaxone; Decadron; Dexamethasone sodium phosphate; Diltiazem hcl; Diltiazem hcl; Flagyl; Gabapentin; Iohexol; Ivp dye; Levothyroxine; Sulfonamide derivatives; Synthroid; Copaxone ; and Interferon beta-1a  Social History Social History  Substance Use Topics  . Smoking status: Never Smoker   . Smokeless tobacco: Never Used  . Alcohol Use: No    Review of Systems Constitutional: Negative for fever. Eyes: Negative for visual changes. ENT: Negative for sore throat. Cardiovascular: Negative for chest pain. Respiratory: Negative for shortness of breath. Gastrointestinal: Positive for abdominal pain, rectal bleeding Genitourinary: Negative for dysuria. Musculoskeletal: Negative for back pain. Skin: Negative for rash. Neurological: Negative for headaches, focal weakness or numbness.  10-point ROS otherwise negative.  ____________________________________________   PHYSICAL EXAM:  VITAL SIGNS: ED Triage Vitals  Enc Vitals Group     BP 10/31/15 1211 161/115 mmHg     Pulse Rate 10/31/15 1211 72     Resp 10/31/15 1211 18     Temp 10/31/15 1211 98.3 F (36.8 C)     Temp Source 10/31/15 1211 Oral     SpO2 10/31/15 1211 98 %     Weight 10/31/15 1211 155 lb (70.308 kg)     Height 10/31/15 1211 5\' 2"  (1.575 m)     Head Cir --      Peak Flow --      Pain Score 10/31/15 1212 9     Pain Loc --      Pain Edu? --      Excl. in [place]? --     Constitutional: Alert and oriented. Well appearing and in no distress. Eyes: Conjunctivae are normal. PERRL. Normal extraocular movements. ENT   Head: Normocephalic and atraumatic.   Nose: No congestion/rhinnorhea.   Mouth/Throat: Mucous membranes are moist.   Neck: No stridor. Cardiovascular: Normal rate, regular rhythm. Normal and symmetric distal pulses are present in all  extremities. No murmurs, rubs, or gallops. Respiratory: Normal respiratory effort without tachypnea nor retractions. Breath sounds are clear and equal bilaterally. No wheezes/rales/rhonchi. Gastrointestinal: Soft and nontender. No distention. No abdominal bruits. Hyper-active bowel sounds Rectal: Perirectal area is tender to touch, appears mildly excoriated. No external hemorrhoids, no visible blood or palpable stool. Heme-negative. Musculoskeletal: Nontender with normal range of motion in all extremities. No joint effusions.  No lower extremity tenderness nor edema. Neurologic:  Normal speech and language. No gross focal neurologic deficits are appreciated. Speech is normal. No gait instability. Skin:  Skin is warm, dry and intact. No rash noted. Psychiatric: Mood and affect are normal. Speech and behavior are normal. Patient exhibits appropriate insight and judgment. ____________________________________________  ED COURSE:  Pertinent labs & imaging results that were available during my care of the patient were reviewed by me and considered in my medical decision making (see chart for details). Patient with diarrhea and rectal bleeding of uncertain etiology. Exam here is been grossly unremarkable, we'll check basic labs and reevaluate. ____________________________________________    LABS (  pertinent positives/negatives)  Labs Reviewed  COMPREHENSIVE METABOLIC PANEL - Abnormal; Notable for the following:    Glucose, Bld 152 (*)    AST 13 (*)    GFR calc non Af Amer 55 (*)    All other components within normal limits  CBC - Abnormal; Notable for the following:    RDW 14.9 (*)    All other components within normal limits  TYPE AND SCREEN  ABO/RH  ____________________________________________  FINAL ASSESSMENT AND PLAN  Diarrhea, rectal bleeding  Plan: Patient with labs as dictated above. Patient with benign exam, normal labs here. I will give her Anusol suppositories. She has had  painless bleeding associated with the diarrhea. She was also given antidiarrheal agents here. She will be advised to have close follow-up with her doctor for reevaluation.   Earleen Newport, MD   Earleen Newport, MD 10/31/15 [phone]

## 2015-10-31 NOTE — ED Notes (Signed)
Pt presents to ED with complaints of diarrhea and rectal bleeding. Pt denies vomiting. Pt reports history of hemorrhoids and states her bottom is very painful.

## 2015-10-31 NOTE — Discharge Instructions (Signed)
Bloody Diarrhea ° ° °Bloody diarrhea can be caused by many different conditions. Most of the time bloody diarrhea is the result of food poisoning or minor infections. Bloody diarrhea usually improves over 2 to 3 days of rest and fluid replacement. Other conditions that can cause bloody diarrhea include: °· Internal bleeding. °· Infection. °· Diseases of the bowel and colon. °Internal bleeding from an ulcer or bowel disease can be severe and requires hospital care or even surgery. °DIAGNOSIS  °To find out what is wrong your caregiver may check your: °· Stool. °· Blood. °· Results from a test that looks inside the body (endoscopy). °TREATMENT  °· Get plenty of rest. °· Drink enough water and fluids to keep your urine clear or pale yellow. °· Do not smoke. °· Solid foods and dairy products should be avoided until your illness improves. °· As you improve, slowly return to a regular diet with easily-digested foods first. Examples are: °¨ Bananas. °¨ Rice. °¨ Toast. °¨ Crackers. °You should only need these for about 2 days before adding more normal foods to your diet. °· Avoid spicy or fatty foods as well as caffeine and alcohol for several days. °· Medicine to control cramping and diarrhea can relieve symptoms but may prolong some cases of bloody diarrhea. Antibiotics can speed recovery from diarrhea due to some bacterial infections. Call your caregiver if diarrhea does not get better in 3 days. °SEEK MEDICAL CARE IF:  °· You do not improve after 3 days. °· Your diarrhea improves but your stool appears black. °SEEK IMMEDIATE MEDICAL CARE IF:  °· You become extremely weak or faint. °· You become very sweaty. °· You have increased pain or bleeding. °· You develop repeated vomiting. °· You vomit and you see blood or the vomit looks black in color. °· You have a fever. °  °This information is not intended to replace advice given to you by your health care provider. Make sure you discuss any questions you have with your  health care provider. °  °Document Released: 07/23/2005 Document Revised: 08/13/2014 Document Reviewed: 06/24/2009 °Elsevier Interactive Patient Education ©2016 [employer] ° °

## 2015-11-07 ENCOUNTER — Ambulatory Visit (INDEPENDENT_AMBULATORY_CARE_PROVIDER_SITE_OTHER): Payer: Commercial Managed Care - HMO | Admitting: Urology

## 2015-11-07 ENCOUNTER — Encounter: Payer: Self-pay | Admitting: Urology

## 2015-11-07 VITALS — BP 118/71 | HR 83 | Ht 61.0 in | Wt 154.9 lb

## 2015-11-07 DIAGNOSIS — M539 Dorsopathy, unspecified: Secondary | ICD-10-CM

## 2015-11-07 DIAGNOSIS — N3010 Interstitial cystitis (chronic) without hematuria: Secondary | ICD-10-CM | POA: Diagnosis not present

## 2015-11-07 MED ORDER — LIDOCAINE HCL 2 % EX GEL
1.0000 "application " | Freq: Once | CUTANEOUS | Status: AC
  Administered 2015-11-07: 1 via URETHRAL

## 2015-11-07 MED ORDER — SODIUM BICARBONATE 8.4 % IV SOLN
11.0000 mL | Freq: Once | INTRAVENOUS | Status: AC
  Administered 2015-11-07: 11 mL

## 2015-11-07 NOTE — Progress Notes (Signed)
Bladder Rescue Solution Instillation  Due to Cystitis patient is present today for a Rescue Solution Treatment.  Patient was cleaned and prepped in a sterile fashion with betadine and lidocaine 2% jelly was instilled into the urethra.  A 14 FR catheter was inserted, urine return was noted 62ml, urine was blue in color because of medication.  Instilled a solution consisting of 8ml of Sodium Bicarb, 2 ml Lidocaine and 1 ml of Heparin. The catheter was then removed. Patient tolerated well, no complications were noted.   Performed by: Lyndee Hensen CMA  Follow up/ Additional Notes: One week

## 2015-11-07 NOTE — Progress Notes (Signed)
10:46 PM   [name] [birth year] [id number]  Referring provider: Bobetta Lime, MD [address] [place], [place] [address]  Chief Complaint  Patient presents with  . Cystitis    Rescue Solution    HPI: Patient is an 82 year old white female with a history of IC who presents today for rescue installation.     Since her last visit with [place], she had an episode of bloody diarrhea. She was seen and evaluated at [facility] emergency room Department.  She was given Imodium and suppositories and diagnosed with hemorrhoids.   She has an upcoming appointment on Wednesday for the findings on a CT chest noted increased sclerosis inferiorly in the T7 vertebral body with equivocal inferior endplate irregularity. This could be reactive findings from a subtle compression fracture air. Osseous metastatic disease from breast cancer is not totally excluded. Consider thoracic spine MRI with and without contrast for further characterization.    Today, she is experiencing mild suprapubic burning.  Her UA is remarkable for RBC's and WBC's.  She denies any gross hematuria or other changes in urinary symptoms.  She's not had any fevers, chills, nausea or vomiting.   PMH: Past Medical History  Diagnosis Date  . GERD (gastroesophageal reflux disease)   . Vertigo   . Essential hypertension, benign   . Paroxysmal supraventricular tachycardia ([place])   . Pure hypercholesterolemia   . MS (multiple sclerosis) ([place])   . Fibromyalgia   . Diverticulosis   . Diabetes mellitus without complication ([place])   . Osteoarthritis   . Dyslipidemia   . SOB (shortness of breath)     SECONDARY TO REFLUX  . Chest pain     SECONDARY TO GERD  . Peripheral neuropathy (HCC)     MILD  . Cancer ([place])     breast  . Depression   . Chronic interstitial cystitis   . History of fractured rib   . Chronic right hip pain   . History of TIA (transient ischemic attack)   . Carpal tunnel syndrome   . Thyroid nodule    . Hearing loss of left ear   . Vitamin B 12 deficiency   . Protein malnutrition ([place])   . Frequent falls   . Anxiety and depression   . Irritable bowel   . Recurrent UTI     Surgical History: Past Surgical History  Procedure Laterality Date  . Total abdominal hysterectomy w/ bilateral salpingoophorectomy    . Mastectomy Right     DX MASTITIS/NO CANCER.Marland KitchenSALINE IMPLANT  . Cholecystectomy    . Hemorrhoid surgery      WITH RECONSTRUCTION  . Bladder tacking      X 3  . Eye surgeries      WITH BUCKLE DETACHMENT OF THE RETINA  . Tonsillectomy    . Appendectomy    . Tympanoplasty    . Breast surgery    . Mastectomy    . Breast lumpectomy    . Colonoscopy with propofol N/A 01/21/2015    Procedure: COLONOSCOPY WITH PROPOFOL;  Surgeon: Josefine Class, MD;  Location[facility] ENDOSCOPY;  Service: Endoscopy;  Laterality: N/A;  . Esophagogastroduodenoscopy N/A 01/21/2015    Procedure: ESOPHAGOGASTRODUODENOSCOPY (EGD);  Surgeon: Josefine Class, MD;  Location: [facility] ENDOSCOPY;  Service: Endoscopy;  Laterality: N/A;    Home Medications:    Medication List       This list is accurate as of: 11/07/15 10:46 PM.  Always use your most recent med  list.               amoxicillin-clavulanate 875-125 MG tablet  Commonly known as:  AUGMENTIN  Reported on 11/07/2015     [facility] VITAMIN B12 2000 MCG tablet  Generic drug:  cyanocobalamin  Take by mouth.     dicyclomine 10 MG capsule  Commonly known as:  BENTYL  TAKE 1 CAPSULE (10 MG TOTAL) BY MOUTH EVERY 8 (EIGHT) HOURS AS NEEDED.     diphenoxylate-atropine 2.5-0.025 MG tablet  Commonly known as:  LOMOTIL  Take 1 tablet by mouth 4 (four) times daily as needed for diarrhea or loose stools.     ferrous sulfate 325 (65 FE) MG tablet  Take 1 tablet (325 mg total) by mouth daily with breakfast.     FINGERSTIX LANCETS Misc  1 Device by Does not apply route 3 (three) times daily.     fluticasone 50 MCG/ACT nasal spray  Commonly known as:   FLONASE  SPRAY 2 SPRAYS EACH INTO BOTH NOSTRILS ONCE DAILY EACH NIGHT.     glucose blood test strip  1 each by Other route as needed for other. Use as instructed to check blood glucose 3x/day     HYDROcodone-acetaminophen 5-325 MG tablet  Commonly known as:  NORCO/VICODIN  Take 2 tablets by mouth every 6 (six) hours as needed for severe pain.     hydrocortisone 25 MG suppository  Commonly known as:  ANUSOL-HC  Place 1 suppository (25 mg total) rectally 2 (two) times daily.     ibuprofen 200 MG tablet  Commonly known as:  ADVIL,MOTRIN  Take 200 mg by mouth every 6 (six) hours as needed.     lidocaine 5 %  Commonly known as:  LIDODERM  Place 1 patch onto the skin every 12 (twelve) hours. Remove & Discard patch within 12 hours or as directed by MD     meclizine 25 MG tablet  Commonly known as:  ANTIVERT  TAKE ONE TO TWO TABLETS BY MOUTH EVERY 8 HOURS AS NEEDED     metFORMIN 500 MG tablet  Commonly known as:  GLUCOPHAGE  Take 1 tablet (500 mg total) by mouth daily with breakfast.     omeprazole 40 MG capsule  Commonly known as:  PRILOSEC  Take 1 capsule (40 mg total) by mouth daily.     oxyCODONE-acetaminophen 5-325 MG tablet  Commonly known as:  ROXICET  Take 1 tablet by mouth every 6 (six) hours as needed for severe pain.     polyethylene glycol packet  Commonly known as:  MIRALAX / GLYCOLAX  Take 17 g by mouth daily as needed.     PROBIOTIC COLON SUPPORT Caps  Take by mouth.     rosuvastatin 5 MG tablet  Commonly known as:  CRESTOR  Take 0.5 tablets (2.5 mg total) by mouth at bedtime.     sertraline 50 MG tablet  Commonly known as:  ZOLOFT  Take 1 tablet (50 mg total) by mouth daily.     TRUE METRIX AIR GLUCOSE METER Devi  1 Device by Does not apply route 3 (three) times daily.     URIBEL 118 MG Caps  One tablet four times daily as needed with 8 oz of water     VITAMIN D-1000 MAX ST 1000 units tablet  Generic drug:  Cholecalciferol  Take by mouth.      Vitamins A & D 5000-400 units Caps  Reported on 10/24/2015        Allergies:    Allergies  Allergen Reactions  . Biaxin [Clarithromycin] Other (See Comments) and Diarrhea  . Copaxone [Glatiramer Acetate]   . Decadron [Dexamethasone] Nausea And Vomiting and Other (See Comments)    Other reaction(s): Muscle Pain Reaction:  Abdominal pain  . Dexamethasone Sodium Phosphate Other (See Comments)  . Diltiazem Hcl Other (See Comments)    Reaction:  Unknown   . Diltiazem Hcl Other (See Comments)  . Flagyl [Metronidazole] Nausea Only and Diarrhea    Other reaction(s): Vomiting  . Gabapentin Other (See Comments)    "burning all over" Reaction:  Unknown   . Iohexol Swelling and Other (See Comments)     Desc: tongue swelling with "IVP dye" sccording to nurses notes with a lumbar myelo-12/09- asm, Onset Date: [id number]  Pts tongue swells.  . Ivp Dye [Iodinated Diagnostic Agents] Swelling and Other (See Comments)    Passed out  Pts tongue swells.   . Levothyroxine Nausea Only  . Sulfonamide Derivatives Other (See Comments)    Reaction:  Unknown   . Synthroid [Levothyroxine Sodium]   . Copaxone  [Glatiramer] Rash  . Interferon Beta-1a Other (See Comments) and Rash    Reaction:  Unknown     Family History: Family History  Problem Relation Age of Onset  . Arthritis Sister   . Aneurysm Brother   . Heart disease Daughter   . ALS Mother   . Colon cancer    . Kidney disease Neg Hx     Social History:  reports that she has never smoked. She has never used smokeless tobacco. She reports that she does not drink alcohol or use illicit drugs.  ROS: UROLOGY Frequent Urination?: No Hard to postpone urination?: No Burning/pain with urination?: Yes Get up at night to urinate?: Yes Leakage of urine?: No Urine stream starts and stops?: No Trouble starting stream?: No Do you have to strain to urinate?: No Blood in urine?: No Urinary tract infection?: No Sexually transmitted disease?:  No Injury to kidneys or bladder?: No Painful intercourse?: No Weak stream?: No Currently pregnant?: No Vaginal bleeding?: No Last menstrual period?: n  Gastrointestinal Nausea?: No Vomiting?: No Indigestion/heartburn?: Yes Diarrhea?: Yes Constipation?: Yes  Constitutional Fever: No Night sweats?: Yes Weight loss?: No Fatigue?: Yes  Skin Skin rash/lesions?: No Itching?: No  Eyes Blurred vision?: Yes Double vision?: No  Ears/Nose/Throat Sore throat?: No Sinus problems?: No  Hematologic/Lymphatic Swollen glands?: No Easy bruising?: No  Cardiovascular Leg swelling?: No Chest pain?: No  Respiratory Cough?: No Shortness of breath?: No  Endocrine Excessive thirst?: No  Musculoskeletal Back pain?: Yes Joint pain?: Yes  Neurological Headaches?: No Dizziness?: Yes  Psychologic Depression?: Yes Anxiety?: Yes  Physical Exam: Blood pressure 153/52, pulse 76, height 5' (1.524 m), weight 157 lb (71.215 kg). Constitutional: Well nourished. Alert and oriented, No acute distress. HEENT: [place] AT, moist mucus membranes. Trachea midline, no masses. Cardiovascular: No clubbing, cyanosis, or edema. Respiratory: Normal respiratory effort, no increased work of breathing. GI: Abdomen is soft, non tender, non distended, no abdominal masses. GU: No CVA tenderness.  No bladder fullness or masses.   Skin: No rashes, bruises or suspicious lesions. Lymph: No cervical or inguinal adenopathy. Neurologic: Grossly intact, no focal deficits, moving all 4 extremities. Psychiatric: Normal mood and affect.  Laboratory Data: Lab Results  Component Value Date   WBC 4.3 10/31/2015   HGB 13.6 10/31/2015   HCT 40.9 10/31/2015   MCV 91.0 10/31/2015   PLT 190 10/31/2015   Lab Results  Component Value Date     CREATININE 0.94 10/31/2015   Lab Results  Component Value Date   HGBA1C 7.0 09/02/2015   Pertinent Imaging CLINICAL DATA: Microscopic hematuria  EXAM: RENAL /  URINARY TRACT ULTRASOUND COMPLETE  COMPARISON: Abdominal and pelvic CT scan without contrast dated November 12, 2014.  FINDINGS: Right Kidney:  Length: 9.2 cm. Echogenicity within normal limits. No mass or hydronephrosis visualized.  Left Kidney:  Length: 10.0 cm. There are multiple renal parenchymal cysts on the left. The faintly calcified structure in the posterior aspect of the midpole seen on the previous study is not clearly evident on today's ultrasound. There is a midpole cyst measuring 1.5 x 1.6 x 1.8 cm. There is an upper pole cyst measuring 0.9 cm in diameter. There is a lower pole cyst measuring 1.1 cm in diameter. There is no hydronephrosis.  Bladder:  The urinary bladder is nondistended and poorly evaluated.  IMPRESSION: 1. There are simple appearing cysts in the left kidney. The radiodense structure in the midpole of the right kidney demonstrated on the previous CT scan is not clearly evident on today's ultrasound. 2. The right kidney is unremarkable. 3. The urinary bladder is not well evaluated on today's study. 4. If there is persistent microscopic hematuria and the urinary bladder has been cleared, MRI of the kidneys would be a useful next imaging step in an effort to evaluate the known hyperdense midpole lesion on the left which is not evident on today's ultrasound.   Electronically Signed  By: David Jordan M.D.  On: 07/19/2015 14:29          Urinalysis: Results for orders placed or performed in visit on 11/07/15  Microscopic Examination  Result Value Ref Range   WBC, UA 11-30 (A) 0 -  5 /hpf   RBC, UA 3-10 (A) 0 -  2 /hpf   Epithelial Cells (non renal) >10 (A) 0 - 10 /hpf   Bacteria, UA None seen None seen/Few  Urinalysis, Complete  Result Value Ref Range   Specific Gravity, UA >1.030 (H) 1.005 - 1.030   pH, UA 5.5 5.0 - 7.5   Color, UA Green (A) Yellow   Appearance Ur Hazy (A) Clear   Leukocytes, UA Trace (A) Negative    Protein, UA Trace (A) Negative/Trace   Glucose, UA Negative Negative   Ketones, UA Trace (A) Negative   RBC, UA Trace (A) Negative   Bilirubin, UA Negative Negative   Urobilinogen, Ur 0.2 0.2 - 1.0 mg/dL   Nitrite, UA Negative Negative   Microscopic Examination See below:     Assessment & Plan:    1. Interstitial cystitis:  Patient received her rescue solution today.  She will return next week for her next treatment.  UA sent for culture.   2. Suspicious lesions in the T 7 vertebral body:   Patient will be evaluated at [facility] cancer [facility] on Wednesday for this finding.   Return in about 1 week (around 11/14/2015) for resuce instillation.   , PA-C  [facility] [address], [address] [place], [place] [address] [phone]   

## 2015-11-08 ENCOUNTER — Ambulatory Visit: Payer: Self-pay | Admitting: *Deleted

## 2015-11-08 ENCOUNTER — Encounter: Payer: Self-pay | Admitting: *Deleted

## 2015-11-08 ENCOUNTER — Other Ambulatory Visit: Payer: Self-pay | Admitting: *Deleted

## 2015-11-08 DIAGNOSIS — E1110 Type 2 diabetes mellitus with ketoacidosis without coma: Secondary | ICD-10-CM

## 2015-11-08 LAB — URINALYSIS, COMPLETE
BILIRUBIN UA: NEGATIVE
GLUCOSE, UA: NEGATIVE
Nitrite, UA: NEGATIVE
Urobilinogen, Ur: 0.2 mg/dL (ref 0.2–1.0)
pH, UA: 5.5 (ref 5.0–7.5)

## 2015-11-08 LAB — MICROSCOPIC EXAMINATION
Bacteria, UA: NONE SEEN
Epithelial Cells (non renal): >10 /hpf — AB (ref 0–10)

## 2015-11-08 NOTE — Patient Outreach (Addendum)
[name]) Care Management  [name]  11/08/2015   [name] [birth year] [id number]  [name] telephone call to patient.  [employer] compliance verified. Per patient she has fallen twice in one day. Patient is using a cane and a walker. Per patient she is having problems with dizziness. Patient went to the ENT and had a tube placed in her ear. Per patient she is having trouble taking her blood sugar. She had received a new machine from [facility] and had dropped that one. She has another new machine. She is unable to get it to work her blood sugar. Patient went to the hospital with GI bleeding after having frequent diarrhea. Patient stated she has an appointment with an oncologist tomorrow. Per patient stated she is suppose to have an MRI . Per patient it causes her to have burning in her hands and it feels like bee stings. RN health coach asked patient if any one had asked her about having any metal in her body and she stated no. RN asked do you and patient stated no I just have some screws in my back and they put something in my chest.   [name] told patient to tell everybody that asks about doing a MRI.  Patient is able to state what to do with hypoglycemia. Patient has agreed to follow up outreach.   Objective:   Encounter Medications:  Outpatient Encounter Prescriptions as of 11/08/2015  Medication Sig Note  . amoxicillin-clavulanate (AUGMENTIN) 875-125 MG tablet Reported on 11/07/2015 11/07/2015: Received from: External Pharmacy  . Blood Glucose Monitoring Suppl (TRUE METRIX AIR GLUCOSE METER) DEVI 1 Device by Does not apply route 3 (three) times daily.   . Cholecalciferol (VITAMIN D-1000 MAX ST) 1000 UNITS tablet Take by mouth. 08/15/2015: Takes 1 capsule daily at bedtime  . cyanocobalamin ([facility] VITAMIN B12) 2000 MCG tablet Take by mouth. 08/15/2015: Takes 1 tablet daily in the morning  . dicyclomine (BENTYL) 10 MG capsule TAKE 1 CAPSULE (10 MG TOTAL)  BY MOUTH EVERY 8 (EIGHT) HOURS AS NEEDED. 03/24/2015: Received from: External Pharmacy  . diphenoxylate-atropine (LOMOTIL) 2.5-0.025 MG tablet Take 1 tablet by mouth 4 (four) times daily as needed for diarrhea or loose stools.   . ferrous sulfate 325 (65 FE) MG tablet Take 1 tablet (325 mg total) by mouth daily with breakfast.   . FINGERSTIX LANCETS MISC 1 Device by Does not apply route 3 (three) times daily.   . fluticasone (FLONASE) 50 MCG/ACT nasal spray SPRAY 2 SPRAYS EACH INTO BOTH NOSTRILS ONCE DAILY EACH NIGHT. 10/24/2015: Received from: External Pharmacy  . glucose blood test strip 1 each by Other route as needed for other. Use as instructed to check blood glucose 3x/day   . HYDROcodone-acetaminophen (NORCO/VICODIN) 5-325 MG tablet Take 2 tablets by mouth every 6 (six) hours as needed for severe pain.   . hydrocortisone (ANUSOL-HC) 25 MG suppository Place 1 suppository (25 mg total) rectally 2 (two) times daily.   Marland Kitchen ibuprofen (ADVIL,MOTRIN) 200 MG tablet Take 200 mg by mouth every 6 (six) hours as needed.   . lidocaine (LIDODERM) 5 % Place 1 patch onto the skin every 12 (twelve) hours. Remove & Discard patch within 12 hours or as directed by MD (Patient not taking: Reported on 10/24/2015)   . meclizine (ANTIVERT) 25 MG tablet TAKE ONE TO TWO TABLETS BY MOUTH EVERY 8 HOURS AS NEEDED   . metFORMIN (GLUCOPHAGE) 500 MG tablet Take 1 tablet (500 mg total)  by mouth daily with breakfast.   . Meth-Hyo-M Bl-Na Phos-Ph Sal (URIBEL) 118 MG CAPS One tablet four times daily as needed with 8 oz of water 08/12/2015: Takes 1 capsule every morning  . omeprazole (PRILOSEC) 40 MG capsule Take 1 capsule (40 mg total) by mouth daily.   Marland Kitchen oxyCODONE-acetaminophen (ROXICET) 5-325 MG tablet Take 1 tablet by mouth every 6 (six) hours as needed for severe pain. (Patient not taking: Reported on 11/07/2015)   . polyethylene glycol (MIRALAX / GLYCOLAX) packet Take 17 g by mouth daily as needed.   . Probiotic Product (PROBIOTIC  COLON SUPPORT) CAPS Take by mouth. 08/15/2015: Takes 1 capsule by mouth daily at night  . rosuvastatin (CRESTOR) 5 MG tablet Take 0.5 tablets (2.5 mg total) by mouth at bedtime.   . sertraline (ZOLOFT) 50 MG tablet Take 1 tablet (50 mg total) by mouth daily.   . Vitamins A & D 5000-400 units CAPS Reported on 10/24/2015 10/24/2015: Received from: External Pharmacy   No facility-administered encounter medications on file as of 11/08/2015.    Functional Status:  In your present state of health, do you have any difficulty performing the following activities: 10/14/2015 10/12/2015  Hearing? [name]  Vision? Y Y  Difficulty concentrating or making decisions? N N  Walking or climbing stairs? Y N  Dressing or bathing? N N  Doing errands, shopping? Y N  Preparing Food and eating ? - -  Using the Toilet? - -  In the past six months, have you accidently leaked urine? - -  Do you have problems with loss of bowel control? - -  Managing your Medications? - -  Managing your Finances? - -  Housekeeping or managing your Housekeeping? - -    Fall/Depression Screening: PHQ 2/9 Scores 10/14/2015 10/12/2015 08/12/2015 05/12/2015 04/15/2015 02/21/2015  PHQ - 2 Score 1 1 2  0 0 1  PHQ- 9 Score - - 12 - - -   THN CM Care Plan Problem One        Most Recent Value   Care Plan Problem One  knowledge deficit in self management of diabetes   Role Documenting the Problem One  Health [name] for Problem One  Active   THN Long Term Goal (31-90 days)  Patient will not have any admissions for diabetes in the next 90 days   THN Long Term Goal Start Date  10/14/15   Interventions for Problem One Long Term Goal  RN reminded patient to keep all apoointments with PCP and other caregivers. RN reminded patient the importance of taking all medication as prescribed.   THN CM Short Term Goal #1 (0-30 days)  Patient will verbalized the symptoms of hypo and hyperglycemia within the next 30 days.    THN CM Short Term Goal #1 Start Date   11/08/15 [ongoing this needs continous reinforcement]   Interventions for Short Term Goal #1  RN discussed hypo and hyperglycemia symptoms with patient. RN will send picture chart of hypo and hyperglycemia symptoms and action plan. RN will follow up with discussion and teach back with a month.   THN CM Short Term Goal #2 (0-30 days)  Patient will report checking blood sugar daily and recording results with the next 30 days   THN CM Short Term Goal #2 Start Date  11/08/15 [patient is not taking blood sugras daily. Per patient having probelems with the machine.]   Interventions for Short Term Goal #2  RN reminded the patient of  checking her blood sugar each morning. RN explained to patient to write it down with a color marker since patient having some difficult seeing it.   THN CM Short Term Goal #3 (0-30 days)  Patient will not have any falls within the next 30 days   THN CM Short Term Goal #3 Start Date  10/14/15 [not met patient had fallen x2]   Interventions for Short Tern Goal #3  RN discussed with patient about fall safety. Per patient she has fluid in ear that is causing her balance to be off. Patient couldn't remember the name of ENT she had seen. RN called ENT from patient description and verified . RN called patient back and gave her name and number to set up an appointment      Assessment:  This patient would benefit from a community RN Visit Plan:  Referral to community RN  RN will send patient educational information on what to do on a sick day.   [facility] [phone]

## 2015-11-09 ENCOUNTER — Inpatient Hospital Stay: Payer: Commercial Managed Care - HMO | Attending: Internal Medicine | Admitting: Internal Medicine

## 2015-11-09 VITALS — BP 137/76 | HR 72 | Temp 99.3°F | Resp 18 | Wt 156.5 lb

## 2015-11-09 DIAGNOSIS — E119 Type 2 diabetes mellitus without complications: Secondary | ICD-10-CM | POA: Insufficient documentation

## 2015-11-09 DIAGNOSIS — E538 Deficiency of other specified B group vitamins: Secondary | ICD-10-CM | POA: Insufficient documentation

## 2015-11-09 DIAGNOSIS — Z9012 Acquired absence of left breast and nipple: Secondary | ICD-10-CM | POA: Insufficient documentation

## 2015-11-09 DIAGNOSIS — N3010 Interstitial cystitis (chronic) without hematuria: Secondary | ICD-10-CM | POA: Diagnosis not present

## 2015-11-09 DIAGNOSIS — M549 Dorsalgia, unspecified: Secondary | ICD-10-CM | POA: Insufficient documentation

## 2015-11-09 DIAGNOSIS — R079 Chest pain, unspecified: Secondary | ICD-10-CM | POA: Insufficient documentation

## 2015-11-09 DIAGNOSIS — N390 Urinary tract infection, site not specified: Secondary | ICD-10-CM | POA: Insufficient documentation

## 2015-11-09 DIAGNOSIS — Z853 Personal history of malignant neoplasm of breast: Secondary | ICD-10-CM | POA: Insufficient documentation

## 2015-11-09 DIAGNOSIS — R0602 Shortness of breath: Secondary | ICD-10-CM | POA: Insufficient documentation

## 2015-11-09 DIAGNOSIS — E785 Hyperlipidemia, unspecified: Secondary | ICD-10-CM

## 2015-11-09 DIAGNOSIS — E46 Unspecified protein-calorie malnutrition: Secondary | ICD-10-CM | POA: Diagnosis not present

## 2015-11-09 DIAGNOSIS — Z7984 Long term (current) use of oral hypoglycemic drugs: Secondary | ICD-10-CM | POA: Insufficient documentation

## 2015-11-09 DIAGNOSIS — Z8701 Personal history of pneumonia (recurrent): Secondary | ICD-10-CM | POA: Insufficient documentation

## 2015-11-09 DIAGNOSIS — R296 Repeated falls: Secondary | ICD-10-CM | POA: Insufficient documentation

## 2015-11-09 DIAGNOSIS — Z8673 Personal history of transient ischemic attack (TIA), and cerebral infarction without residual deficits: Secondary | ICD-10-CM | POA: Diagnosis not present

## 2015-11-09 DIAGNOSIS — F418 Other specified anxiety disorders: Secondary | ICD-10-CM | POA: Diagnosis not present

## 2015-11-09 DIAGNOSIS — D0512 Intraductal carcinoma in situ of left breast: Secondary | ICD-10-CM

## 2015-11-09 DIAGNOSIS — I471 Supraventricular tachycardia: Secondary | ICD-10-CM | POA: Insufficient documentation

## 2015-11-09 DIAGNOSIS — G35 Multiple sclerosis: Secondary | ICD-10-CM | POA: Diagnosis not present

## 2015-11-09 DIAGNOSIS — G8929 Other chronic pain: Secondary | ICD-10-CM | POA: Diagnosis not present

## 2015-11-09 DIAGNOSIS — F329 Major depressive disorder, single episode, unspecified: Secondary | ICD-10-CM | POA: Insufficient documentation

## 2015-11-09 DIAGNOSIS — G629 Polyneuropathy, unspecified: Secondary | ICD-10-CM | POA: Insufficient documentation

## 2015-11-09 DIAGNOSIS — K589 Irritable bowel syndrome without diarrhea: Secondary | ICD-10-CM | POA: Diagnosis not present

## 2015-11-09 DIAGNOSIS — M797 Fibromyalgia: Secondary | ICD-10-CM | POA: Diagnosis not present

## 2015-11-09 DIAGNOSIS — K219 Gastro-esophageal reflux disease without esophagitis: Secondary | ICD-10-CM | POA: Insufficient documentation

## 2015-11-09 DIAGNOSIS — Z79899 Other long term (current) drug therapy: Secondary | ICD-10-CM | POA: Diagnosis not present

## 2015-11-09 DIAGNOSIS — M25551 Pain in right hip: Secondary | ICD-10-CM

## 2015-11-09 DIAGNOSIS — M1990 Unspecified osteoarthritis, unspecified site: Secondary | ICD-10-CM | POA: Diagnosis not present

## 2015-11-09 DIAGNOSIS — R350 Frequency of micturition: Secondary | ICD-10-CM | POA: Insufficient documentation

## 2015-11-09 DIAGNOSIS — I10 Essential (primary) hypertension: Secondary | ICD-10-CM | POA: Diagnosis not present

## 2015-11-09 LAB — CULTURE, URINE COMPREHENSIVE

## 2015-11-09 NOTE — Progress Notes (Signed)
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   # debility/ chronic back pain/ Hx of falls/ interstitial cystitis   HISTORY OF PRESENTING ILLNESS:  [name] 82 y.o.  female with a history of left breast DCIS status post lumpectomy followed by radiation in 2015- had a recent fall that led to a CAT scan of the chest which was fairly unremarkable- except that showed T7 sclerosis- subtle- likely healing fractures-the differential diagnosis included metastasis from breast cancer.  Patient was recently seen by urology for increased frequency of urination/interstitial cystitis. Given the abnormal imaging patient has been referred by urology for further evaluation.  Patient has chronic back pain. This is not getting any worse. She has history of falls. She continues to complain of pain in her left chest wall. Otherwise no unusual weight loss. No headaches.   ROS: A complete 10 point review of system is done which is negative except mentioned above in history of present illness  MEDICAL HISTORY:  Past Medical History  Diagnosis Date  . GERD (gastroesophageal reflux disease)   . Vertigo   . Essential hypertension, benign   . Paroxysmal supraventricular tachycardia ([place])   . Pure hypercholesterolemia   . MS (multiple sclerosis) ([place])   . Fibromyalgia   . Diverticulosis   . Diabetes mellitus without complication ([place])   . Osteoarthritis   . Dyslipidemia   . SOB (shortness of breath)     SECONDARY TO REFLUX  . Chest pain     SECONDARY TO GERD  . Peripheral neuropathy (HCC)     MILD  . Cancer ([place])     breast  . Depression    . Chronic interstitial cystitis   . History of fractured rib   . Chronic right hip pain   . History of TIA (transient ischemic attack)   . Carpal tunnel syndrome   . Thyroid nodule   . Hearing loss of left ear   . Vitamin B 12 deficiency   . Protein malnutrition ([place])   . Frequent falls   . Anxiety and depression   . Irritable bowel   . Recurrent UTI     SURGICAL HISTORY: Past Surgical History  Procedure Laterality Date  . Total abdominal hysterectomy w/ bilateral salpingoophorectomy    . Mastectomy Right     DX MASTITIS/NO CANCER.Marland KitchenSALINE IMPLANT  . Cholecystectomy    . Hemorrhoid surgery      WITH RECONSTRUCTION  . Bladder tacking      X 3  . Eye surgeries      WITH BUCKLE DETACHMENT OF THE RETINA  . Tonsillectomy    . Appendectomy    . Tympanoplasty    . Breast surgery    . Mastectomy    . Breast lumpectomy    . Colonoscopy with propofol N/A 01/21/2015    Procedure: COLONOSCOPY WITH PROPOFOL;  Surgeon: Josefine Class, MD;  Location: [facility] ENDOSCOPY;  Service: Endoscopy;  Laterality: N/A;  . Esophagogastroduodenoscopy N/A 01/21/2015    Procedure: ESOPHAGOGASTRODUODENOSCOPY (EGD);  Surgeon: Josefine Class, MD;  Location: [facility] ENDOSCOPY;  Service: Endoscopy;  Laterality: N/A;    SOCIAL HISTORY: Social History  Social History  . Marital Status: Married    Spouse Name: Marcello Moores  . Number of Children: 4  . Years of Education: N/A   Occupational History  . 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 Smoking status: Never Smoker   . Smokeless tobacco: Never Used  . Alcohol Use: No  . Drug Use: No  . Sexual Activity: No   Other Topics Concern  . Not on file   Social History Narrative   1 SON, 3 DAUGHTER'S   15 GRANDCHILDREN   LIVING AT [facility]    FAMILY HISTORY: Family History  Problem Relation Age of Onset  . Arthritis Sister   . Aneurysm Brother   . Heart disease Daughter   . ALS Mother   . Colon cancer     . Kidney disease Neg Hx     ALLERGIES:  is allergic to biaxin; copaxone; decadron; dexamethasone sodium phosphate; diltiazem hcl; diltiazem hcl; flagyl; gabapentin; iohexol; ivp dye; levothyroxine; sulfonamide derivatives; synthroid; copaxone ; and interferon beta-1a.  MEDICATIONS:  Current Outpatient Prescriptions  Medication Sig Dispense Refill  . Cholecalciferol (VITAMIN D-1000 MAX ST) 1000 UNITS tablet Take 1,000 Units by mouth daily.     Marland Kitchen dicyclomine (BENTYL) 10 MG capsule TAKE 1 CAPSULE (10 MG TOTAL) BY MOUTH EVERY 8 (EIGHT) HOURS AS NEEDED.  1  . diphenoxylate-atropine (LOMOTIL) 2.5-0.025 MG tablet Take 1 tablet by mouth 4 (four) times daily as needed for diarrhea or loose stools. 30 tablet 1  . ferrous sulfate 325 (65 FE) MG tablet Take 1 tablet (325 mg total) by mouth daily with breakfast. 90 tablet 1  . fluticasone (FLONASE) 50 MCG/ACT nasal spray SPRAY 2 SPRAYS EACH INTO BOTH NOSTRILS ONCE DAILY EACH NIGHT.  5  . HYDROcodone-acetaminophen (NORCO/VICODIN) 5-325 MG tablet Take 2 tablets by mouth every 6 (six) hours as needed for severe pain. 15 tablet 0  . hydrocortisone (ANUSOL-HC) 25 MG suppository Place 1 suppository (25 mg total) rectally 2 (two) times daily. 12 suppository 0  . ibuprofen (ADVIL,MOTRIN) 200 MG tablet Take 200 mg by mouth every 6 (six) hours as needed.    . lidocaine (LIDODERM) 5 % Place 1 patch onto the skin every 12 (twelve) hours. Remove & Discard patch within 12 hours or as directed by MD 10 patch 0  . meclizine (ANTIVERT) 25 MG tablet TAKE ONE TO TWO TABLETS BY MOUTH EVERY 8 HOURS AS NEEDED 40 tablet 1  . metFORMIN (GLUCOPHAGE) 500 MG tablet Take 1 tablet (500 mg total) by mouth daily with breakfast. 90 tablet 2  . Meth-Hyo-M Bl-Na Phos-Ph Sal (URIBEL) 118 MG CAPS One tablet four times daily as needed with 8 oz of water 25 capsule 3  . omeprazole (PRILOSEC) 40 MG capsule Take 1 capsule (40 mg total) by mouth daily. 90 capsule 2  . polyethylene glycol  (MIRALAX / GLYCOLAX) packet Take 17 g by mouth daily as needed.    . Probiotic Product (PROBIOTIC COLON SUPPORT) CAPS Take 1 capsule by mouth daily.     . rosuvastatin (CRESTOR) 5 MG tablet Take 2.5 mg by mouth daily at 6 PM.    . sertraline (ZOLOFT) 50 MG tablet Take 1 tablet (50 mg total) by mouth daily. 90 tablet 2  . cyanocobalamin (CVS VITAMIN B12) 2000 MCG tablet Take 2,000 mcg by mouth daily.     . Vitamins A & D 5000-400 units CAPS Take 1 capsule by mouth daily. Reported on  10/24/2015     No current facility-administered medications for this visit.      Marland Kitchen  PHYSICAL EXAMINATION:   Filed Vitals:   11/09/15 1102  BP: 137/76  Pulse: 72  Temp: 99.3 F (37.4 C)  Resp: 18   Filed Weights   11/09/15 1102  Weight: 156 lb 8.4 oz (71 kg)    GENERAL: Well-nourished well-developed; Alert, no distress and comfortable. In a wheelchair. Accompanied by her husband. EYES: no pallor or icterus OROPHARYNX: no thrush or ulceration; good dentition  NECK: supple, no masses felt LYMPH:  no palpable lymphadenopathy in the cervical, axillary or inguinal regions LUNGS: clear to auscultation and  No wheeze or crackles HEART/CVS: regular rate & rhythm and no murmurs; No lower extremity edema ABDOMEN: abdomen soft, non-tender and normal bowel sounds Musculoskeletal:no cyanosis of digits and no clubbing; patient has diffuse tenderness on the left chest wall. No ecchymosis on the skin.  PSYCH: alert & oriented x 3 with fluent speech NEURO: no focal motor/sensory deficits SKIN:  no rashes or significant lesions Right and left BREAST exam [in the presence of husband]- no unusual skin changes or dominant masses felt. Surgical scars noted.    LABORATORY DATA:  I have reviewed the data as listed Lab Results  Component Value Date   WBC 4.3 10/31/2015   HGB 13.6 10/31/2015   HCT 40.9 10/31/2015   MCV 91.0 10/31/2015   PLT 190 10/31/2015    Recent Labs  08/02/15 1016 08/03/15 1402  10/23/15 1807 10/31/15 1208  NA 143 140 137 139  K 4.5 4.4 4.1 4.2  CL 101 103 108 107  CO2 25 26 24 23   GLUCOSE 132* 142* 182* 152*  BUN 10 13 19 20   CREATININE 0.86 0.96 0.99 0.94  CALCIUM 10.0 9.9 9.5 9.7  GFRNONAA 63 53* 51* 55*  GFRAA 72 >60 59* >60  PROT 6.6 7.4  --  7.2  ALBUMIN 4.2 4.3  --  4.1  AST 10 11*  --  13*  ALT 15 20  --  21  ALKPHOS 46 47  --  45  BILITOT 0.3 0.9  --  0.7    RADIOGRAPHIC STUDIES: I have personally reviewed the radiological images as listed and agreed with the findings in the report. Dg Chest 2 View  10/23/2015  CLINICAL DATA:  Sharp pain beneath the left breast after falling and hitting the wooden arm of her couch 2 days ago. EXAM: CHEST  2 VIEW COMPARISON:  01/06/2015 and chest CT dated 01/09/2015. FINDINGS: The cardiac silhouette remains borderline enlarged. Surgical left breast surgical clips are again demonstrated. A loop recorder is also again demonstrated. The lungs remain clear with mildly prominent interstitial markings and hyperexpansion. Thoracic spine degenerative changes and changes of DISH are again demonstrated. Cholecystectomy clips. No fracture or pneumothorax seen. IMPRESSION: 1. No acute abnormality. 2. Stable mild changes of COPD. Electronically Signed   By: Claudie Revering M.D.   On: 10/23/2015 18:53   Ct Chest Wo Contrast  10/23/2015  CLINICAL DATA:  Fall on Friday landing on left side.  Dyspnea. EXAM: CT CHEST WITHOUT CONTRAST TECHNIQUE: Multidetector CT imaging of the chest was performed following the standard protocol without IV contrast. COMPARISON:  01/09/2015 FINDINGS: Mediastinum/Nodes: Left anterior descending coronary artery atherosclerotic calcification. No pathologic thoracic adenopathy. Lungs/Pleura: Mild biapical pleural parenchymal scarring. 3 mm nodule in the superior segment left lower lobe, image 17 series 3, no change from 01/09/2015. Post radiation therapy findings in the lingula. Upper abdomen:  Fluid density 1.3 cm  lesion in the left hepatic lobe image 48 series 2, stable. Faintly rim calcified hypodense splenic lesion 3.9 cm on image 48 series 2, previously the same. Musculoskeletal: Prior left lumpectomy with clips in place. Right breast implant. Degenerative glenohumeral arthropathy on the left. Accounting for breathing motion artifact, I do not see a definite left rib fracture. Thoracic spondylosis is present with multilevel bridging spurring. There is new sclerosis inferiorly in the T7 vertebral body, images 76-84 of series 6, with some slight inferior endplate irregularity along the sclerosis. IMPRESSION: 1. Increased sclerosis inferiorly in the T7 vertebral body with equivocal inferior endplate irregularity. This could be reactive findings from a subtle compression fracture air. Osseous metastatic disease from breast cancer is not totally excluded. Consider thoracic spine MRI with and without contrast for further characterization. 2. I do not observe a definite rib fracture. 3. Coronary atherosclerosis. 4. Stable 3 mm nodule in the superior segment left lower lobe, likely incidental, may merit observation. This appears unchanged from the earliest available comparison of 01/09/2015. 5. Stable right hepatic lobe fluid density lesion and stable faintly rim calcified splenic lesion, likely benign. Electronically Signed   By: Van Clines M.D.   On: 10/23/2015 19:57    ASSESSMENT & PLAN:   # LEFT BREAST DCIS s/p Lumpec & RT. clinically based on physical exam/history concerns for recurrence at this time.   # Sclerosis of T7 vertebral body- it's very unlikely for DCIS status post treatment to recur distantly in in the spine. I suspect the sclerosis is from her osteoporosis/compression fractures. Patient states that she has intolerance to MRI. I would not recommend any further evaluation at this time unless clinically her symptoms merit further imaging. The subtle imaging changes are unlikely to be from her history  of DCIS/breast cancer recurrence.  # Left chest wall pain likely traumatic no evidence of any acute fractures. Recommend when necessary Tylenol/ice packs.    # I would not recommend any further follow-up in our clinic at this time. Patient will continue to follow up with her radiation oncologist Dr. Donella Stade. Since her PCP has moved; patient is going to see Dr. Sanda Klein.   Thank you Ms.McGowan for allowing me to participate in the care of your pleasant patient. Please do not hesitate to contact me with questions or concerns in the interim.     Cammie Sickle, MD 11/09/2015 11:18 AM

## 2015-11-10 ENCOUNTER — Encounter: Payer: Self-pay | Admitting: *Deleted

## 2015-11-10 ENCOUNTER — Other Ambulatory Visit: Payer: Self-pay | Admitting: *Deleted

## 2015-11-10 NOTE — Patient Outreach (Signed)
[facility]) Care Management  11/10/2015  [name] 1930-06-25 [id number]   Received referral from Health Coach for community care coordinator, for home visit to assist patient with monitoring her blood sugar, and safety evaluation patient with frequent fall. RNCM placed call to patient HIPAA compliance verified, [name] verified that she does have a new meter but does know how to use it and is agreeable to home visit. Due to several other appointments she has, patient not available until next week. Patient denies having any symptoms of hypoglycemia states she is aware of symptoms and how to treat episodes if occurs.   [name] reports she has a history of falls, and has a cane and walker for use at home , encouraged her to use at all times.  Patient denies any immediate concerns at this time. Provided my contact telephone number.  Plan  RNCM will visit patient in home in the next week, initial visit scheduled.  Joylene Draft, RN, [place] Management [phone]- Mobile [phone]- Toll Free Main Office

## 2015-11-11 ENCOUNTER — Ambulatory Visit: Payer: Commercial Managed Care - HMO | Admitting: *Deleted

## 2015-11-11 ENCOUNTER — Ambulatory Visit (INDEPENDENT_AMBULATORY_CARE_PROVIDER_SITE_OTHER): Payer: Commercial Managed Care - HMO | Admitting: Family Medicine

## 2015-11-11 ENCOUNTER — Encounter: Payer: Self-pay | Admitting: Family Medicine

## 2015-11-11 VITALS — BP 144/82 | HR 85 | Temp 98.6°F | Resp 14 | Wt 157.0 lb

## 2015-11-11 DIAGNOSIS — R42 Dizziness and giddiness: Secondary | ICD-10-CM | POA: Diagnosis not present

## 2015-11-11 DIAGNOSIS — D0510 Intraductal carcinoma in situ of unspecified breast: Secondary | ICD-10-CM

## 2015-11-11 DIAGNOSIS — M25541 Pain in joints of right hand: Secondary | ICD-10-CM | POA: Diagnosis not present

## 2015-11-11 DIAGNOSIS — K591 Functional diarrhea: Secondary | ICD-10-CM

## 2015-11-11 DIAGNOSIS — I10 Essential (primary) hypertension: Secondary | ICD-10-CM

## 2015-11-11 DIAGNOSIS — G35 Multiple sclerosis: Secondary | ICD-10-CM | POA: Diagnosis not present

## 2015-11-11 DIAGNOSIS — N3010 Interstitial cystitis (chronic) without hematuria: Secondary | ICD-10-CM

## 2015-11-11 DIAGNOSIS — E041 Nontoxic single thyroid nodule: Secondary | ICD-10-CM

## 2015-11-11 DIAGNOSIS — R911 Solitary pulmonary nodule: Secondary | ICD-10-CM | POA: Diagnosis not present

## 2015-11-11 DIAGNOSIS — M25542 Pain in joints of left hand: Secondary | ICD-10-CM

## 2015-11-11 DIAGNOSIS — R296 Repeated falls: Secondary | ICD-10-CM

## 2015-11-11 DIAGNOSIS — I2510 Atherosclerotic heart disease of native coronary artery without angina pectoris: Secondary | ICD-10-CM | POA: Diagnosis not present

## 2015-11-11 DIAGNOSIS — M25549 Pain in joints of unspecified hand: Secondary | ICD-10-CM | POA: Insufficient documentation

## 2015-11-11 MED ORDER — ASPIRIN EC 81 MG PO TBEC: 81.0000 mg | DELAYED_RELEASE_TABLET | Freq: Every day | ORAL | Status: DC

## 2015-11-11 NOTE — Progress Notes (Signed)
BP 144/82 mmHg  Pulse 85  Temp(Src) 98.6 F (37 C) (Oral)  Resp 14  Wt 157 lb (71.215 kg)  SpO2 97%   Subjective:    Patient ID: [name], female    DOB: 11-28-1929, 82 y.o.   MRN: [id number]  HPI: [name] is a 82 y.o. female  Chief Complaint  Patient presents with  . Follow-up    ER on 3/27  . Blood In Stools    bright red in toliet bowl.  Patient was having diarrhea  . Fall    recurrent falls at home, patient states gets dizzy   She was has had pain in the left breast; s/p mastectomy on the right; fell on the chest; saw oncologist yesterday and he did a good thorough breast exam; due for next mammo in October  She fell on a Friday 2-3 weeks ago; fell twice in one day; she went from her love seat across the arm of the couch and sent down; she did not pass out; gets dizzy at times and can't keep her balance; she has MS so she's had issues since age 48; I offered PT and she says she went a week but then ended up in the ER, within the last 30 days; using rolling walker for about 2-1/2 or 3 years  March 19th ER visit reviewed; that was for the fall  EXAM: CT CHEST WITHOUT CONTRAST  TECHNIQUE: Multidetector CT imaging of the chest was performed following the standard protocol without IV contrast.  COMPARISON: 01/09/2015  FINDINGS: Mediastinum/Nodes: Left anterior descending coronary artery atherosclerotic calcification. No pathologic thoracic adenopathy.  Lungs/Pleura: Mild biapical pleural parenchymal scarring. 3 mm nodule in the superior segment left lower lobe, image 17 series 3, no change from 01/09/2015. Post radiation therapy findings in the lingula.  Upper abdomen: Fluid density 1.3 cm lesion in the left hepatic lobe image 48 series 2, stable. Faintly rim calcified hypodense splenic lesion 3.9 cm on image 48 series 2, previously the same.  Musculoskeletal: Prior left lumpectomy with clips in place. Right breast implant.  Degenerative  glenohumeral arthropathy on the left. Accounting for breathing motion artifact, I do not see a definite left rib fracture. Thoracic spondylosis is present with multilevel bridging spurring.  There is new sclerosis inferiorly in the T7 vertebral body, images 76-84 of series 6, with some slight inferior endplate irregularity along the sclerosis.  IMPRESSION: 1. Increased sclerosis inferiorly in the T7 vertebral body with equivocal inferior endplate irregularity. This could be reactive findings from a subtle compression fracture air. Osseous metastatic disease from breast cancer is not totally excluded. Consider thoracic spine MRI with and without contrast for further characterization. 2. I do not observe a definite rib fracture. 3. Coronary atherosclerosis. 4. Stable 3 mm nodule in the superior segment left lower lobe, likely incidental, may merit observation. This appears unchanged from the earliest available comparison of 01/09/2015. 5. Stable right hepatic lobe fluid density lesion and stable faintly rim calcified splenic lesion, likely benign.   Electronically Signed  By: Van Clines M.D.  On: 10/23/2015 19:57  She also went to the ER on March 27th for rectal bleeding; she just saw the blood the one time; no more bleeding; ER note reviewed; she has internal and external hemorrhoids; they gave her suppositories; exam showed some tenderness, heme negative stool  She needs a rheumatologist because of bad arthritis in her hands; had three operations in her hands; cyst taken off on the left palm; trigger/locked finger  on the right index; she is right-handed; gets like a claw hand; hands hurt; she puts them under hot water and that helps  She skipped to having hx of back surgery and buckles in her eyes and then "this thing" for her heart; she says she needs a heart doctor; I asked what her heart problem was and she says that they implanted "this thing" in her left chest (3  years ago, maybe loop recorder); she was having palpitations; then she said she has two places on her thyroid and they were following those, Dr. Gabriel Carina or Secundino Ginger but she doesn't call me anymore  She just saw Zara Council (urologist) earlier this week; has interstitial cystitis  Relevant past medical, surgical, family and social history reviewed and updated as indicated Past Medical History  Diagnosis Date  . GERD (gastroesophageal reflux disease)   . Vertigo   . Essential hypertension, benign   . Paroxysmal supraventricular tachycardia ([place])   . Pure hypercholesterolemia   . MS (multiple sclerosis) ([place])   . Fibromyalgia   . Diverticulosis   . Diabetes mellitus without complication ([place])   . Osteoarthritis   . Dyslipidemia   . SOB (shortness of breath)     SECONDARY TO REFLUX  . Chest pain     SECONDARY TO GERD  . Peripheral neuropathy (HCC)     MILD  . Cancer ([place])     breast  . Depression   . Chronic interstitial cystitis   . History of fractured rib   . Chronic right hip pain   . History of TIA (transient ischemic attack)   . Carpal tunnel syndrome   . Thyroid nodule   . Hearing loss of left ear   . Vitamin B 12 deficiency   . Protein malnutrition ([place])   . Frequent falls   . Anxiety and depression   . Irritable bowel   . Recurrent UTI   . Solitary pulmonary nodule on lung CT 12/04/2015    3 mm LLL lung nodule June 2016, March 2017   Past Surgical History  Procedure Laterality Date  . Total abdominal hysterectomy w/ bilateral salpingoophorectomy    . Mastectomy Right     DX MASTITIS/NO CANCER.Marland KitchenSALINE IMPLANT  . Cholecystectomy    . Hemorrhoid surgery      WITH RECONSTRUCTION  . Bladder tacking      X 3  . Eye surgeries      WITH BUCKLE DETACHMENT OF THE RETINA  . Tonsillectomy    . Appendectomy    . Tympanoplasty    . Breast surgery    . Mastectomy    . Breast lumpectomy    . Colonoscopy with propofol N/A 01/21/2015    Procedure: COLONOSCOPY WITH  PROPOFOL;  Surgeon: Josefine Class, MD;  Location: [facility] ENDOSCOPY;  Service: Endoscopy;  Laterality: N/A;  . Esophagogastroduodenoscopy N/A 01/21/2015    Procedure: ESOPHAGOGASTRODUODENOSCOPY (EGD);  Surgeon: Josefine Class, MD;  Location: [facility] ENDOSCOPY;  Service: Endoscopy;  Laterality: N/A;   Social History  Substance Use Topics  . Smoking status: Never Smoker   . Smokeless tobacco: Never Used  . Alcohol Use: No    Interim medical history since last visit reviewed. Allergies and medications reviewed and updated.  Review of Systems  Per HPI unless specifically indicated above     Objective:    BP 144/82 mmHg  Pulse 85  Temp(Src) 98.6 F (37 C) (Oral)  Resp 14  Wt 157 lb (71.215 kg)  SpO2 97%  Wt  Readings from Last 3 Encounters:  11/30/15 158 lb 4.8 oz (71.804 kg)  11/25/15 157 lb 9.6 oz (71.487 kg)  11/24/15 158 lb 3.2 oz (71.759 kg)    Physical Exam  Constitutional: She appears well-developed and well-nourished. No distress.  HENT:  Head: Normocephalic and atraumatic.  Eyes: EOM are normal. No scleral icterus.  Neck: No thyromegaly present.  Cardiovascular: Normal rate, regular rhythm and normal heart sounds.   No murmur heard. Pulmonary/Chest: Effort normal and breath sounds normal. No respiratory distress. She has no wheezes.  Abdominal: Soft. Bowel sounds are normal. She exhibits no distension.  Musculoskeletal: She exhibits no edema.       Right hand: She exhibits decreased range of motion (trigger finger right index finger) and tenderness (MCPs).       Left hand: She exhibits decreased range of motion (flexion of left ring finger limited), tenderness (across MCPs) and deformity (surgical scar over left palm; nodular swelling left palm over 4th flexor tendons).  Neurological: She is alert. She exhibits normal muscle tone.  Skin: Skin is warm and dry. She is not diaphoretic. No pallor.  Psychiatric: She has a normal mood and affect. Her behavior is  normal. Judgment and thought content normal.    Results for orders placed or performed in visit on 11/07/15  CULTURE, URINE COMPREHENSIVE  Result Value Ref Range   Urine Culture, Comprehensive Final report    Result 1 Comment   Microscopic Examination  Result Value Ref Range   WBC, UA 11-30 (A) 0 -  5 /hpf   RBC, UA 3-10 (A) 0 -  2 /hpf   Epithelial Cells (non renal) >10 (A) 0 - 10 /hpf   Bacteria, UA None seen None seen/Few  Urinalysis, Complete  Result Value Ref Range   Specific Gravity, UA >1.030 (H) 1.005 - 1.030   pH, UA 5.5 5.0 - 7.5   Color, UA Green (A) Yellow   Appearance Ur Hazy (A) Clear   Leukocytes, UA Trace (A) Negative   Protein, UA Trace (A) Negative/Trace   Glucose, UA Negative Negative   Ketones, UA Trace (A) Negative   RBC, UA Trace (A) Negative   Bilirubin, UA Negative Negative   Urobilinogen, Ur 0.2 0.2 - 1.0 mg/dL   Nitrite, UA Negative Negative   Microscopic Examination See below:       Assessment & Plan:   Problem List Items Addressed This Visit      Cardiovascular and Mediastinum   Hypertension goal BP (blood pressure) < 140/90    Fair control today; DASH guidelines      Relevant Medications   aspirin EC 81 MG tablet   Coronary atherosclerosis    Noted on CT scan; refer to cardiologist; start aspirin 81 mg EC once a day      Relevant Medications   aspirin EC 81 MG tablet   Other Relevant Orders   Ambulatory referral to Cardiology     Digestive   Diarrhea, functional    With episode of BRBPR; has since resolved; checked out in ER; watch for any recurrence        Endocrine   Thyroid nodule    Followed by endo        Nervous and Auditory   Multiple sclerosis ([place])    With frequent falls; refer back to neuro      Relevant Orders   Ambulatory referral to Neurology     Genitourinary   Interstitial cystitis    Managed by urologist  Other   Frequent falls    Referral back to neuro; fall precautions; continue to work  with PT      Carcinoma in situ, breast, ductal    Hx of, with recent pain in chest wall; thorough breast exam by oncologist this week per patient; not repeated today      Pain, joint, hand - Primary    Turmeric suggested; will refer to rheumatologist      Relevant Orders   Ambulatory referral to Rheumatology   Dizziness   Relevant Orders   Ambulatory referral to Neurology   Solitary pulmonary nodule on lung CT    3 mm LLL lung nodule first noted June 2016; seen again March 2017; rescan March 2018         Follow up plan: Return in about 2 weeks (around 11/25/2015) for 40 minute visit for multiple issues.  An after-visit summary was printed and given to the patient at [place].  Please see the patient instructions which may contain other information and recommendations beyond what is mentioned above in the assessment and plan.

## 2015-11-11 NOTE — Assessment & Plan Note (Signed)
Followed by endo.  

## 2015-11-11 NOTE — Assessment & Plan Note (Signed)
Noted on CT scan; refer to cardiologist; start aspirin 81 mg EC once a day

## 2015-11-11 NOTE — Patient Instructions (Addendum)
We'll refer you to a neurologist and a rheumatologist and a cardiologist If you have not heard anything from my staff in a week about any orders/referrals/studies from today, please contact us here to follow-up [phone] Try turmeric as a natural anti-inflammatory (for pain and arthritis). It comes in capsules where you buy aspirin and fish oil, but also as a spice where you buy pepper and garlic powder. Keep working with your physical therapist Return in 2-3 weeks for a 40 minute visit for all of your other medical issues Start 81 mg coated aspirin once a day Practice good fall precautions  Fall Prevention in the Home  Falls can cause injuries and can affect people from all age groups. There are many simple things that you can do to make your home safe and to help prevent falls. WHAT CAN I DO ON THE OUTSIDE OF MY HOME?  Regularly repair the edges of walkways and driveways and fix any cracks.  Remove high doorway thresholds.  Trim any shrubbery on the main path into your home.  Use bright outdoor lighting.  Clear walkways of debris and clutter, including tools and rocks.  Regularly check that handrails are securely fastened and in good repair. Both sides of any steps should have handrails.  Install guardrails along the edges of any raised decks or porches.  Have leaves, snow, and ice cleared regularly.  Use sand or salt on walkways during winter months.  In the garage, clean up any spills right away, including grease or oil spills. WHAT CAN I DO IN THE BATHROOM?  Use night lights.  Install grab bars by the toilet and in the tub and shower. Do not use towel bars as grab bars.  Use non-skid mats or decals on the floor of the tub or shower.  If you need to sit down while you are in the shower, use a plastic, non-slip stool.Marland Kitchen  Keep the floor dry. Immediately clean up any water that spills on the floor.  Remove soap buildup in the tub or shower on a regular basis.  Attach  bath mats securely with double-sided non-slip rug tape.  Remove throw rugs and other tripping hazards from the floor. WHAT CAN I DO IN THE BEDROOM?  Use night lights.  Make sure that a bedside light is easy to reach.  Do not use oversized bedding that drapes onto the floor.  Have a firm chair that has side arms to use for getting dressed.  Remove throw rugs and other tripping hazards from the floor. WHAT CAN I DO IN THE KITCHEN?   Clean up any spills right away.  Avoid walking on wet floors.  Place frequently used items in easy-to-reach places.  If you need to reach for something above you, use a sturdy step stool that has a grab bar.  Keep electrical cables out of the way.  Do not use floor polish or wax that makes floors slippery. If you have to use wax, make sure that it is non-skid floor wax.  Remove throw rugs and other tripping hazards from the floor. WHAT CAN I DO IN THE STAIRWAYS?  Do not leave any items on the stairs.  Make sure that there are handrails on both sides of the stairs. Fix handrails that are broken or loose. Make sure that handrails are as long as the stairways.  Check any carpeting to make sure that it is firmly attached to the stairs. Fix any carpet that is loose or worn.  Avoid having  throw rugs at the top or bottom of stairways, or secure the rugs with carpet tape to prevent them from moving.  Make sure that you have a light switch at the top of the stairs and the bottom of the stairs. If you do not have them, have them installed. WHAT ARE SOME OTHER FALL PREVENTION TIPS?  Wear closed-toe shoes that fit well and support your feet. Wear shoes that have rubber soles or low heels.  When you use a stepladder, make sure that it is completely opened and that the sides are firmly locked. Have someone hold the ladder while you are using it. Do not climb a closed stepladder.  Add color or contrast paint or tape to grab bars and handrails in your home.  Place contrasting color strips on the first and last steps.  Use mobility aids as needed, such as canes, walkers, scooters, and crutches.  Turn on lights if it is dark. Replace any light bulbs that burn out.  Set up furniture so that there are clear paths. Keep the furniture in the same spot.  Fix any uneven floor surfaces.  Choose a carpet design that does not hide the edge of steps of a stairway.  Be aware of any and all pets.  Review your medicines with your healthcare provider. Some medicines can cause dizziness or changes in blood pressure, which increase your risk of falling. Talk with your health care provider about other ways that you can decrease your risk of falls. This may include working with a physical therapist or trainer to improve your strength, balance, and endurance.   This information is not intended to replace advice given to you by your health care provider. Make sure you discuss any questions you have with your health care provider.   Document Released: 07/13/2002 Document Revised: 12/07/2014 Document Reviewed: 08/27/2014 Elsevier Interactive Patient Education [employer].

## 2015-11-15 ENCOUNTER — Ambulatory Visit (INDEPENDENT_AMBULATORY_CARE_PROVIDER_SITE_OTHER): Payer: Commercial Managed Care - HMO | Admitting: Urology

## 2015-11-15 ENCOUNTER — Encounter: Payer: Self-pay | Admitting: Urology

## 2015-11-15 ENCOUNTER — Encounter: Payer: Self-pay | Admitting: *Deleted

## 2015-11-15 ENCOUNTER — Other Ambulatory Visit: Payer: Self-pay | Admitting: *Deleted

## 2015-11-15 VITALS — BP 138/71 | HR 83 | Temp 98.4°F | Ht 61.0 in | Wt 156.5 lb

## 2015-11-15 DIAGNOSIS — N3010 Interstitial cystitis (chronic) without hematuria: Secondary | ICD-10-CM

## 2015-11-15 LAB — URINALYSIS, COMPLETE
BILIRUBIN UA: NEGATIVE
Glucose, UA: NEGATIVE
Ketones, UA: NEGATIVE
Nitrite, UA: NEGATIVE
PH UA: 5 (ref 5.0–7.5)
Protein, UA: NEGATIVE
Specific Gravity, UA: 1.025 (ref 1.005–1.030)
Urobilinogen, Ur: 0.2 mg/dL (ref 0.2–1.0)

## 2015-11-15 LAB — MICROSCOPIC EXAMINATION: BACTERIA UA: NONE SEEN

## 2015-11-15 MED ORDER — SODIUM BICARBONATE 8.4 % IV SOLN
11.0000 mL | Freq: Once | INTRAVENOUS | Status: AC
  Administered 2015-11-15: 11 mL

## 2015-11-15 MED ORDER — LIDOCAINE HCL 2 % EX GEL
1.0000 "application " | Freq: Once | CUTANEOUS | Status: AC
  Administered 2015-11-15: 1 via URETHRAL

## 2015-11-15 NOTE — Progress Notes (Signed)
Bladder Rescue Solution Instillation  Due to interstitial cystitis patient is present today for a Rescue Solution Treatment.  Patient was cleaned and prepped in a sterile fashion with betadine and lidocaine 2% jelly was instilled into the urethra.  A 14 FR catheter was inserted, urine return was noted 72ml, urine was clear and yellow in color.  Instilled a solution consisting of 48ml of Sodium Bicarb, 2 ml Lidocaine and 1 ml of Heparin. The catheter was then removed. Patient tolerated well, no complications were noted.   Performed by: Lyndee Hensen CMA  Follow up/ Additional Notes: One week

## 2015-11-15 NOTE — Patient Outreach (Signed)
[name]   11/15/2015  [name] Jan 31, 1930 [id number]  [name] is an 82 y.o. female  Subjective: I am doing pretty good on today, just need a little help with making sure I am using this machine right.  Objective:  BP 120/70 mmHg  Pulse 67  Resp 18  SpO2 97% Review of Systems  Constitutional: Negative.   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   Cardiovascular: Negative.   Gastrointestinal: Negative.  Negative for blood in stool.  Musculoskeletal: Positive for falls.  Skin: Negative.   Neurological: Positive for dizziness.  Endo/Heme/Allergies: Negative.   Psychiatric/Behavioral: Negative.     Physical Exam  Constitutional: She is oriented to person, place, and time. She appears well-developed and well-nourished.  HENT:  Hard of hearing left ear  Cardiovascular: Normal rate and normal heart sounds.   Respiratory: Effort normal and breath sounds normal.  GI: Soft.  Neurological: She is alert and oriented to person, place, and time.  Skin: Skin is warm and dry.  Psychiatric: She has a normal mood and affect. Her behavior is normal. Judgment and thought content normal.    Encounter Medications:   Outpatient Encounter Prescriptions as of 11/15/2015  Medication Sig Note  . aspirin EC 81 MG tablet Take 1 tablet (81 mg total) by mouth daily.   . Carboxymethylcell-Hypromellose (GENTEAL OP) Apply 1 drop to eye. Each eye as needed   . cyanocobalamin ([facility] VITAMIN B12) 2000 MCG tablet Take 2,000 mcg by mouth daily.  08/15/2015: Takes 1 tablet daily in the morning  . dicyclomine (BENTYL) 10 MG capsule Take 10 mg by mouth 4 (four) times daily -  before meals and at bedtime.   . ferrous sulfate 325 (65 FE) MG tablet Take 1 tablet (325 mg total) by mouth daily with breakfast.   . fluticasone (FLONASE) 50 MCG/ACT nasal spray SPRAY 2 SPRAYS EACH INTO  BOTH NOSTRILS ONCE DAILY EACH NIGHT. 10/24/2015: Received from: External Pharmacy  . HYDROcodone-acetaminophen (NORCO/VICODIN) 5-325 MG tablet TAKE 2 TABLETS BY MOUTH EVERY 6 HOURS AS NEEDED FOR SEVERE PAIN. 11/15/2015: Received from: External Pharmacy  . hydrocortisone (ANUSOL-HC) 25 MG suppository Place 1 suppository (25 mg total) rectally 2 (two) times daily.   Marland Kitchen loperamide (IMODIUM A-D) 2 MG tablet Take 2 mg by mouth 4 (four) times daily as needed for diarrhea or loose stools.   . meclizine (ANTIVERT) 25 MG tablet TAKE ONE TO TWO TABLETS BY MOUTH EVERY 8 HOURS AS NEEDED   . metFORMIN (GLUCOPHAGE) 500 MG tablet Take 1 tablet (500 mg total) by mouth daily with breakfast.   . Meth-Hyo-M Bl-Na Phos-Ph Sal (URIBEL) 118 MG CAPS One tablet four times daily as needed with 8 oz of water 08/12/2015: Takes 1 capsule every morning  . omeprazole (PRILOSEC) 20 MG capsule Take 20 mg by mouth daily.   . ondansetron (ZOFRAN) 4 MG tablet Take 4 mg by mouth every 8 (eight) hours as needed for nausea or vomiting.   . Probiotic Product (PROBIOTIC COLON SUPPORT) CAPS Take 1 capsule by mouth daily.  08/15/2015: Takes 1 capsule by mouth daily at night  . rosuvastatin (CRESTOR) 5 MG tablet Take 2.5 mg by mouth daily at 6 PM.   . sertraline (ZOLOFT) 50 MG tablet Take 1 tablet (50 mg total) by mouth daily.   . Simethicone (  GAS-X EXTRA STRENGTH) 125 MG CAPS Take 1 capsule by mouth as needed.   . Vitamins A & D 5000-400 units CAPS Take 1 capsule by mouth daily. Reported on 10/24/2015 10/24/2015: Received from: External Pharmacy  . amoxicillin (AMOXIL) 875 MG tablet Take 875 mg by mouth 2 (two) times daily. Start date 3/33   . Cholecalciferol (VITAMIN D-1000 MAX ST) 1000 UNITS tablet Take 1,000 Units by mouth daily. Reported on 11/15/2015 08/15/2015: Takes 1 capsule daily at bedtime  . diphenoxylate-atropine (LOMOTIL) 2.5-0.025 MG tablet Reported on 11/15/2015 11/15/2015: Received from: External Pharmacy  . lidocaine (LIDODERM) 5 % Place  1 patch onto the skin every 12 (twelve) hours. Remove & Discard patch within 12 hours or as directed by MD   . omeprazole (PRILOSEC) 40 MG capsule Take 1 capsule (40 mg total) by mouth daily. (Patient not taking: Reported on 11/15/2015)   . polyethylene glycol (MIRALAX / GLYCOLAX) packet Take 17 g by mouth daily as needed.   . [EXPIRED] heparin-lidocaine-sod bicarb bladder irrigation (Bladder rescue for bladder instillation procedures)     No facility-administered encounter medications on file as of 11/15/2015.    Functional Status:   In your present state of health, do you have any difficulty performing the following activities: 11/10/2015 10/14/2015  Hearing? [name]  Vision? Y Y  Difficulty concentrating or making decisions? Y N  Walking or climbing stairs? Y Y  Dressing or bathing? Y N  Doing errands, shopping? [name]  Preparing Food and eating ? Y -  Using the Toilet? N -  In the past six months, have you accidently leaked urine? Y -  Do you have problems with loss of bowel control? Y -  Managing your Medications? Y -  Managing your Finances? Y -  Housekeeping or managing your Housekeeping? N -    Fall/Depression Screening:    PHQ 2/9 Scores 11/10/2015 10/14/2015 10/12/2015 08/12/2015 05/12/2015 04/15/2015 02/21/2015  PHQ - 2 Score 1 1 1 2  0 0 1  PHQ- 9 Score - - - 12 - - -    Assessment:  Community care coordinator home visit to assist patient with education on using her blood glucose meter.  Greeted in hallway by patient walking down the hallway using her cane,  [name] daughter [name]  present at visit and filled patient medication box during visit. [name] discussed her upcoming appointment with ENT follow up for left ear, as recent tubes placed,  she has a new cardiology appointment to follow up on her implanted recorder,  and awaiting neurology consult for her MS.  [name] daughter states she normally accompanies patient to appointments.  [name] also discussed her plans  to move out of [place] in [place] to be closer to other family to assist with her care.        Diabetes Glucose Monitoring: Patient had glucose meter, strips, lancet and lancet device all available, she discussed barriers that she was having with checking her blood sugar, reviewed how to adjust . Patient demonstrated x 2, with less prompting required the second time. Patient requested follow up visit to make sure that she was able to still complete blood sugar checks appropriately.     Plan:  Will follow up with patient in one more visit in  2 weeks regarding technique in completing blood sugar checks. Will update Health Coach with progress.  THN CM Care Plan Problem One        Most Recent Value   THN CM Short Term Goal #4 (  0-30 days)  Patient will demonstrate proper use of glucose meter in the next 14 days    THN CM Short Term Goal #4 Start Date  11/15/15   Interventions for Short Term Goal #4  Educated patient on step by step use of meter, allow patient to return demonstrate, use teachback , encouraged patient to use her magnifying glass to read  number on meter.      Joylene Draft, RN, [facility] [phone]- Mobile [phone]- Toll Free Main Office

## 2015-11-21 ENCOUNTER — Other Ambulatory Visit: Payer: Self-pay

## 2015-11-21 DIAGNOSIS — R938 Abnormal findings on diagnostic imaging of other specified body structures: Secondary | ICD-10-CM | POA: Diagnosis not present

## 2015-11-21 DIAGNOSIS — M65319 Trigger thumb, unspecified thumb: Secondary | ICD-10-CM | POA: Insufficient documentation

## 2015-11-21 DIAGNOSIS — M6530 Trigger finger, unspecified finger: Secondary | ICD-10-CM | POA: Insufficient documentation

## 2015-11-21 DIAGNOSIS — R9389 Abnormal findings on diagnostic imaging of other specified body structures: Secondary | ICD-10-CM | POA: Insufficient documentation

## 2015-11-21 DIAGNOSIS — M65312 Trigger thumb, left thumb: Secondary | ICD-10-CM | POA: Diagnosis not present

## 2015-11-21 DIAGNOSIS — M159 Polyosteoarthritis, unspecified: Secondary | ICD-10-CM | POA: Diagnosis not present

## 2015-11-21 DIAGNOSIS — M47814 Spondylosis without myelopathy or radiculopathy, thoracic region: Secondary | ICD-10-CM | POA: Diagnosis not present

## 2015-11-21 DIAGNOSIS — M65321 Trigger finger, right index finger: Secondary | ICD-10-CM | POA: Diagnosis not present

## 2015-11-21 DIAGNOSIS — M19042 Primary osteoarthritis, left hand: Secondary | ICD-10-CM | POA: Diagnosis not present

## 2015-11-21 DIAGNOSIS — M19041 Primary osteoarthritis, right hand: Secondary | ICD-10-CM | POA: Diagnosis not present

## 2015-11-21 NOTE — Telephone Encounter (Signed)
Please let patient know that she does not need to check her fingerstick blood sugars There has been a change in guidelines If she is beyond first 6 months of diagnosis of diabetes and does not use insulin, no need to check routine FSBS Thank you

## 2015-11-21 NOTE — Telephone Encounter (Signed)
Left voice mail

## 2015-11-24 ENCOUNTER — Encounter: Payer: Self-pay | Admitting: Urology

## 2015-11-24 ENCOUNTER — Ambulatory Visit (INDEPENDENT_AMBULATORY_CARE_PROVIDER_SITE_OTHER): Payer: Commercial Managed Care - HMO | Admitting: Urology

## 2015-11-24 VITALS — BP 158/72 | HR 75 | Ht 61.0 in | Wt 158.2 lb

## 2015-11-24 DIAGNOSIS — N3010 Interstitial cystitis (chronic) without hematuria: Secondary | ICD-10-CM | POA: Diagnosis not present

## 2015-11-24 DIAGNOSIS — N390 Urinary tract infection, site not specified: Secondary | ICD-10-CM

## 2015-11-24 LAB — URINALYSIS, COMPLETE
Bilirubin, UA: NEGATIVE
GLUCOSE, UA: NEGATIVE
Ketones, UA: NEGATIVE
NITRITE UA: NEGATIVE
PH UA: 5.5 (ref 5.0–7.5)
RBC, UA: NEGATIVE
Specific Gravity, UA: 1.025 (ref 1.005–1.030)
Urobilinogen, Ur: 0.2 mg/dL (ref 0.2–1.0)

## 2015-11-24 LAB — MICROSCOPIC EXAMINATION
Epithelial Cells (non renal): >10 /hpf — AB (ref 0–10)
RBC MICROSCOPIC, UA: NONE SEEN /HPF (ref 0–?)

## 2015-11-25 ENCOUNTER — Ambulatory Visit (INDEPENDENT_AMBULATORY_CARE_PROVIDER_SITE_OTHER): Payer: Commercial Managed Care - HMO | Admitting: Family Medicine

## 2015-11-25 ENCOUNTER — Encounter: Payer: Self-pay | Admitting: Family Medicine

## 2015-11-25 VITALS — BP 122/74 | HR 85 | Temp 98.8°F | Resp 16 | Wt 157.6 lb

## 2015-11-25 DIAGNOSIS — G8929 Other chronic pain: Secondary | ICD-10-CM | POA: Diagnosis not present

## 2015-11-25 DIAGNOSIS — N3010 Interstitial cystitis (chronic) without hematuria: Secondary | ICD-10-CM | POA: Diagnosis not present

## 2015-11-25 DIAGNOSIS — E1140 Type 2 diabetes mellitus with diabetic neuropathy, unspecified: Secondary | ICD-10-CM

## 2015-11-25 DIAGNOSIS — E041 Nontoxic single thyroid nodule: Secondary | ICD-10-CM | POA: Diagnosis not present

## 2015-11-25 DIAGNOSIS — R296 Repeated falls: Secondary | ICD-10-CM | POA: Diagnosis not present

## 2015-11-25 DIAGNOSIS — M2550 Pain in unspecified joint: Secondary | ICD-10-CM | POA: Diagnosis not present

## 2015-11-25 DIAGNOSIS — E7800 Pure hypercholesterolemia, unspecified: Secondary | ICD-10-CM

## 2015-11-25 DIAGNOSIS — R42 Dizziness and giddiness: Secondary | ICD-10-CM | POA: Diagnosis not present

## 2015-11-25 DIAGNOSIS — I2510 Atherosclerotic heart disease of native coronary artery without angina pectoris: Secondary | ICD-10-CM | POA: Diagnosis not present

## 2015-11-25 DIAGNOSIS — G35 Multiple sclerosis: Secondary | ICD-10-CM | POA: Diagnosis not present

## 2015-11-25 DIAGNOSIS — I471 Supraventricular tachycardia: Secondary | ICD-10-CM

## 2015-11-25 NOTE — Assessment & Plan Note (Signed)
Call 911 for any chest pain prior to cardiologist visit; taking two 81 mg aspirin daily

## 2015-11-25 NOTE — Assessment & Plan Note (Signed)
Cared for by urologist

## 2015-11-25 NOTE — Assessment & Plan Note (Signed)
Followed by neurologist 

## 2015-11-25 NOTE — Assessment & Plan Note (Signed)
May be related to medicines; advised stopping bentyl; refer to PT

## 2015-11-25 NOTE — Assessment & Plan Note (Signed)
Not taking hydrocodone per patient; removed from list; managed by Dr. Meda Coffee

## 2015-11-25 NOTE — Patient Instructions (Addendum)
Please do call [facility] and get in to see one of their gastroenterologists about your trouble swallowing pills and rectal pain We'll get you to physical therapy Return to clinic for fasting labs on or after Friday April 28th We'll send you a letter about your lab results I do recommend that you talk to the gastroenterologist about your Bentyl and anti-diarrheal medicines, as those might need to be stopped Call 911 if any chest pain prior to your cardiology visit

## 2015-11-25 NOTE — Assessment & Plan Note (Signed)
Check fasting labs Friday April 28th or just after; limit saturated fats

## 2015-11-25 NOTE — Assessment & Plan Note (Signed)
Followed by Dr. Secundino Ginger, s/p biopsy

## 2015-11-25 NOTE — Assessment & Plan Note (Signed)
Keep f/u with heart doctor next week

## 2015-11-25 NOTE — Assessment & Plan Note (Addendum)
Foot exam by MD today; return for labs on or after April 28th

## 2015-11-25 NOTE — Progress Notes (Signed)
BP 122/74 mmHg  Pulse 85  Temp(Src) 98.8 F (37.1 C) (Oral)  Resp 16  Wt 157 lb 9.6 oz (71.487 kg)  SpO2 96%   Subjective:    Patient ID: [name], female    DOB: 14-Feb-1930, 82 y.o.   MRN: [id number]  HPI: [name] is a 82 y.o. female  Chief Complaint  Patient presents with  . Follow-up  . Fall    recurrent falls    She had xrays done by an orthopaedist; fell twice since last; she is not working with physical therapy right now; did it for a week; her daughter cancelled her PT after just a week; I asked why daughter cancelled the PT, no clear answer given; I don't really understand why this was cancelled; one of her two falls was while using rolling walker with brakes; she does not think she broke anything  HTN; well-controlled; not checking BP at home; uses a little salt  Has hx of PSVT; under the care of cardiologist  Coronary artery atherosclerosis; sees cardiologist; noted incidentally on CT scan March 19th; taking 2 baby aspirin every day; no bleeding  GERD; still gets that; hard time swallowing pills; has seen gastroenterologist; has done EGD and colonoscopy in the last year; she needs to go back there because of hemorrhoids and rectal pain; alternates between diarrhea and constipation; on PPI, antidiarrheals; reviewed med list; she says she does not take both anti-diarrheals, but does take bentyl  Type 2 diabetes for 10 years; some dry mouth; gets thirsty a lot; gets up at night to urinate 4-5 times; has IC; has blurred vision  She is very hard of hearing; needs hearing aides  High cholesterol; on Crestor; hurts a lot but not sure if related to cholesterol; has been going on for a long time; she thinks the pain is MS and fibromyalgia and osteoarthritis   Depression screen [facility] 2/9 12/06/2015 11/25/2015 11/10/2015 10/14/2015 10/12/2015  Decreased Interest 0 0 0 0 0  Down, Depressed, Hopeless 0 1 1 1 1   PHQ - 2 Score 0 1 1 1 1   Altered sleeping - - - - -    Tired, decreased energy - - - - -  Change in appetite - - - - -  Feeling bad or failure about yourself  - - - - -  Trouble concentrating - - - - -  Moving slowly or fidgety/restless - - - - -  Suicidal thoughts - - - - -  PHQ-9 Score - - - - -  Difficult doing work/chores - - - - -   Relevant past medical, surgical, family and social history reviewed Interim medical history since last visit reviewed. Allergies and medications reviewed and updated.  Review of Systems Per HPI unless specifically indicated above     Objective:    BP 122/74 mmHg  Pulse 85  Temp(Src) 98.8 F (37.1 C) (Oral)  Resp 16  Wt 157 lb 9.6 oz (71.487 kg)  SpO2 96%  Wt Readings from Last 3 Encounters:  12/22/15 156 lb 12.8 oz (71.124 kg)  12/15/15 155 lb 9.6 oz (70.58 kg)  12/08/15 157 lb 12.8 oz (71.578 kg)    Physical Exam  Constitutional: She appears well-developed and well-nourished. No distress.  Elderly female, no distress  HENT:  Head: Normocephalic and atraumatic.  Eyes: EOM are normal. No scleral icterus.  Neck: No thyromegaly present.  Cardiovascular: Normal rate, regular rhythm and normal heart sounds.   No murmur heard. Pulmonary/Chest:  Effort normal and breath sounds normal. No respiratory distress. She has no wheezes.  Abdominal: Soft. Bowel sounds are normal. She exhibits no distension.  Musculoskeletal: She exhibits no edema.       Right hand: She exhibits decreased range of motion (trigger finger right index finger).       Left hand: She exhibits decreased range of motion (flexion of left ring finger limited).  Neurological: She is alert. She displays no tremor.  Skin: Skin is warm and dry. She is not diaphoretic. No pallor.  Psychiatric: She has a normal mood and affect. Her behavior is normal. Judgment and thought content normal.   Diabetic Foot Form - Detailed   Diabetic Foot Exam - detailed  Diabetic Foot exam was performed with the following findings:  Yes 11/25/2015  9:04  AM  Visual Foot Exam completed.:  Yes  Are the toenails ingrown?:  No    Pulse Foot Exam completed.:  Yes  Right Dorsalis Pedis:  Present Left Dorsalis Pedis:  Present  Sensory Foot Exam Completed.:  Yes  Semmes-Weinstein Monofilament Test  R Site 1-Great Toe:  Pos L Site 1-Great Toe:  Pos  R Site 4:  Pos L Site 4:  Pos  R Site 5:  Pos L Site 5:  Pos          Assessment & Plan:   Problem List Items Addressed This Visit      Cardiovascular and Mediastinum   Coronary artery calcification seen on CAT scan (Chronic)    Call 911 for any chest pain prior to cardiologist visit; taking two 81 mg aspirin daily      Paroxysmal supraventricular tachycardia (HCC) (Chronic)    Keep f/u with heart doctor next week        Endocrine   DM (diabetes mellitus) type II controlled, neurological manifestation ([place])    Foot exam by MD today; return for labs on or after April 28th      Relevant Orders   Hemoglobin [id number]   Basic metabolic panel   Thyroid nodule    Followed by Dr. Secundino Ginger, s/p biopsy        Nervous and Auditory   Multiple sclerosis ([place])    Followed by neurologist        Genitourinary   Chronic interstitial cystitis    Cared for by urologist        Other   Chronic pain of multiple joints    Not taking hydrocodone per patient; removed from list; managed by Dr. Meda Coffee      Dizziness    May be related to medicines; advised stopping bentyl; refer to PT      Frequent falls - Primary (Chronic)    Refer back to physical therapy; gait training, safety, walker use, etc      Relevant Orders   Ambulatory referral to Physical Therapy   Hypercholesteremia    Check fasting labs Friday April 28th or just after; limit saturated fats      Relevant Orders   Lipid Panel w/o Chol/HDL Ratio      Follow up plan: Return in about 7 days (around 12/02/2015), or or just AFTER, for fasting labs; 3-1/2 months with Dr. Sanda Klein for next visit.  An after-visit summary was printed and  given to the patient at [place].  Please see the patient instructions which may contain other information and recommendations beyond what is mentioned above in the assessment and plan.  No orders of the defined types were placed in this  encounter.    Orders Placed This Encounter  Procedures  . Hemoglobin A1c  . Basic metabolic panel  . Lipid Panel w/o Chol/HDL Ratio  . Ambulatory referral to Physical Therapy

## 2015-11-25 NOTE — Assessment & Plan Note (Signed)
Refer back to physical therapy; gait training, safety, walker use, etc

## 2015-11-26 LAB — CULTURE, URINE COMPREHENSIVE

## 2015-11-28 NOTE — Progress Notes (Signed)
8:[name] [birth year] [id number]  Referring provider: Arnetha Courser, MD [address] [place], [place] [address]  Chief Complaint  Patient presents with  . Cystitis    Rescue Solution    HPI: Patient is an 82 year old white female with a history of IC who presents today for rescue installation.     Since her last visit with [place], she had an episode of bloody diarrhea. She was seen and evaluated at [facility] emergency room Department.  She was given Imodium and suppositories and diagnosed with hemorrhoids.   Patient has been seen and evaluated by Dr.Brahmanday in oncology for the sclerosis of T7 vertebral body.  He felt it was unlikely for DCIS to recur distantly in the spine.  He felt it was more likely from her osteoporosis/compression fractures.  He is not recommending any further evaluation at this time and was clinically her symptoms merit further imaging.    Today, she states she just feels bad.  He feels that she has a urinary tract infection. She is experiencing dysuria and suprapubic pain. She denies fever, chills, nausea or vomiting. Her UA today is positive for 11-30 WBCs per high-power field.  PMH: Past Medical History  Diagnosis Date  . GERD (gastroesophageal reflux disease)   . Vertigo   . Essential hypertension, benign   . Paroxysmal supraventricular tachycardia ([place])   . Pure hypercholesterolemia   . MS (multiple sclerosis) ([place])   . Fibromyalgia   . Diverticulosis   . Diabetes mellitus without complication ([place])   . Osteoarthritis   . Dyslipidemia   . SOB (shortness of breath)     SECONDARY TO REFLUX  . Chest pain     SECONDARY TO GERD  . Peripheral neuropathy (HCC)     MILD  . Cancer ([place])     breast  . Depression   . Chronic interstitial cystitis   . History of fractured rib   . Chronic right hip pain   . History of TIA (transient ischemic attack)   . Carpal tunnel syndrome   . Thyroid nodule   . Hearing loss of left ear     . Vitamin B 12 deficiency   . Protein malnutrition ([place])   . Frequent falls   . Anxiety and depression   . Irritable bowel   . Recurrent UTI     Surgical History: Past Surgical History  Procedure Laterality Date  . Total abdominal hysterectomy w/ bilateral salpingoophorectomy    . Mastectomy Right     DX MASTITIS/NO CANCER.Marland KitchenSALINE IMPLANT  . Cholecystectomy    . Hemorrhoid surgery      WITH RECONSTRUCTION  . Bladder tacking      X 3  . Eye surgeries      WITH BUCKLE DETACHMENT OF THE RETINA  . Tonsillectomy    . Appendectomy    . Tympanoplasty    . Breast surgery    . Mastectomy    . Breast lumpectomy    . Colonoscopy with propofol N/A 01/21/2015    Procedure: COLONOSCOPY WITH PROPOFOL;  Surgeon: Josefine Class, MD;  Location: [facility] ENDOSCOPY;  Service: Endoscopy;  Laterality: N/A;  . Esophagogastroduodenoscopy N/A 01/21/2015    Procedure: ESOPHAGOGASTRODUODENOSCOPY (EGD);  Surgeon: Josefine Class, MD;  Location: [facility] ENDOSCOPY;  Service: Endoscopy;  Laterality: N/A;    Home Medications:    Medication List       This list is accurate as of: 11/24/15 11:59 PM.  Always use your most recent med list.               amoxicillin 875 MG tablet  Commonly known as:  AMOXIL  Take 875 mg by mouth 2 (two) times daily. Reported on 11/24/2015     aspirin EC 81 MG tablet  Take 1 tablet (81 mg total) by mouth daily.     CVS VITAMIN B12 2000 MCG tablet  Generic drug:  cyanocobalamin  Take 2,000 mcg by mouth daily.     dicyclomine 10 MG capsule  Commonly known as:  BENTYL  Take 10 mg by mouth 4 (four) times daily -  before meals and at bedtime.     diphenoxylate-atropine 2.5-0.025 MG tablet  Commonly known as:  LOMOTIL  Reported on 11/15/2015     ferrous sulfate 325 (65 FE) MG tablet  Take 1 tablet (325 mg total) by mouth daily with breakfast.     fluticasone 50 MCG/ACT nasal spray  Commonly known as:  FLONASE  SPRAY 2 SPRAYS EACH INTO BOTH NOSTRILS ONCE DAILY  EACH NIGHT.     GAS-X EXTRA STRENGTH 125 MG Caps  Generic drug:  Simethicone  Take 1 capsule by mouth as needed. Reported on 11/24/2015     GENTEAL OP  Apply 1 drop to eye. Each eye as needed     HYDROcodone-acetaminophen 5-325 MG tablet  Commonly known as:  NORCO/VICODIN  Reported on 11/24/2015     hydrocortisone 25 MG suppository  Commonly known as:  ANUSOL-HC  Place 1 suppository (25 mg total) rectally 2 (two) times daily.     lidocaine 5 %  Commonly known as:  LIDODERM  Place 1 patch onto the skin every 12 (twelve) hours. Remove & Discard patch within 12 hours or as directed by MD     loperamide 2 MG tablet  Commonly known as:  IMODIUM A-D  Take 2 mg by mouth 4 (four) times daily as needed for diarrhea or loose stools.     meclizine 25 MG tablet  Commonly known as:  ANTIVERT  TAKE ONE TO TWO TABLETS BY MOUTH EVERY 8 HOURS AS NEEDED     metFORMIN 500 MG tablet  Commonly known as:  GLUCOPHAGE  Take 1 tablet (500 mg total) by mouth daily with breakfast.     NON FORMULARY     omeprazole 20 MG capsule  Commonly known as:  PRILOSEC  Take 20 mg by mouth daily.     omeprazole 40 MG capsule  Commonly known as:  PRILOSEC  Take 1 capsule (40 mg total) by mouth daily.     ondansetron 4 MG tablet  Commonly known as:  ZOFRAN  Take 4 mg by mouth every 8 (eight) hours as needed for nausea or vomiting.     polyethylene glycol packet  Commonly known as:  MIRALAX / GLYCOLAX  Take 17 g by mouth daily as needed. Reported on 11/15/2015     PROBIOTIC COLON SUPPORT Caps  Take 1 capsule by mouth daily.     rosuvastatin 5 MG tablet  Commonly known as:  CRESTOR  Take 2.5 mg by mouth daily at 6 PM.     sertraline 50 MG tablet  Commonly known as:  ZOLOFT  Take 1 tablet (50 mg total) by mouth daily.     URIBEL 118 MG Caps  One tablet four times daily as needed with 8 oz of water     VITAMIN D-1000 MAX ST 1000 units tablet  Generic drug:  Cholecalciferol  Take 1,000 Units by  mouth daily. Reported on 11/15/2015     Vitamins A & D 5000-400 units Caps  Take 1 capsule by mouth daily. Reported on 10/24/2015        Allergies:  Allergies  Allergen Reactions  . Biaxin [Clarithromycin] Other (See Comments) and Diarrhea  . Copaxone [Glatiramer Acetate]   . Decadron [Dexamethasone] Nausea And Vomiting and Other (See Comments)    Other reaction(s): Muscle Pain Reaction:  Abdominal pain  . Dexamethasone Sodium Phosphate Other (See Comments)  . Diltiazem Hcl Other (See Comments)    Reaction:  Unknown   . Diltiazem Hcl Other (See Comments)  . Flagyl [Metronidazole] Nausea Only and Diarrhea    Other reaction(s): Vomiting  . Gabapentin Other (See Comments)    "burning all over" Reaction:  Unknown   . Iohexol Swelling and Other (See Comments)     Desc: tongue swelling with "IVP dye" sccording to nurses notes with a lumbar myelo-12/09- asm, Onset Date: [id number]  Pts tongue swells.  Clementeen Hoof [Iodinated Diagnostic Agents] Swelling and Other (See Comments)    Passed out  Pts tongue swells.   . Levothyroxine Nausea Only  . Sulfa Antibiotics Other (See Comments)    Reaction:  Unknown   . Sulfonamide Derivatives Other (See Comments)    Reaction:  Unknown   . Synthroid [Levothyroxine Sodium]   . Copaxone  [Glatiramer] Rash  . Interferon Beta-1a Other (See Comments) and Rash    Reaction:  Unknown     Family History: Family History  Problem Relation Age of Onset  . Arthritis Sister   . Aneurysm Brother   . Heart disease Daughter   . ALS Mother   . Colon cancer    . Kidney disease Sister     Social History:  reports that she has never smoked. She has never used smokeless tobacco. She reports that she does not drink alcohol or use illicit drugs.  ROS: UROLOGY Frequent Urination?: No Hard to postpone urination?: No Burning/pain with urination?: Yes Get up at night to urinate?: No Leakage of urine?: No Urine stream starts and stops?: No Trouble starting  stream?: No Do you have to strain to urinate?: No Blood in urine?: No Urinary tract infection?: No Sexually transmitted disease?: No Injury to kidneys or bladder?: No Painful intercourse?: No Weak stream?: No Currently pregnant?: No Vaginal bleeding?: No Last menstrual period?: n  Gastrointestinal Nausea?: No Vomiting?: No Indigestion/heartburn?: Yes Diarrhea?: Yes Constipation?: Yes  Constitutional Fever: No Night sweats?: Yes Weight loss?: No Fatigue?: Yes  Skin Skin rash/lesions?: No Itching?: No  Eyes Blurred vision?: Yes Double vision?: No  Ears/Nose/Throat Sore throat?: No Sinus problems?: No  Hematologic/Lymphatic Swollen glands?: No Easy bruising?: No  Cardiovascular Leg swelling?: No Chest pain?: No  Respiratory Cough?: No Shortness of breath?: No  Endocrine Excessive thirst?: No  Musculoskeletal Back pain?: Yes Joint pain?: Yes  Neurological Headaches?: Yes Dizziness?: Yes  Psychologic Depression?: Yes Anxiety?: Yes  Physical Exam: Blood pressure 153/52, pulse 76, height 5' (1.524 m), weight 157 lb (71.215 kg). Constitutional: Well nourished. Alert and oriented, No acute distress. HEENT: [place] AT, moist mucus membranes. Trachea midline, no masses. Cardiovascular: No clubbing, cyanosis, or edema. Respiratory: Normal respiratory effort, no increased work of breathing. GI: Abdomen is soft, non tender, non distended, no abdominal masses. GU: No CVA tenderness.  No bladder fullness or masses.   Skin: No rashes, bruises or suspicious lesions. Lymph: No cervical or inguinal adenopathy. Neurologic: Grossly intact, no focal  deficits, moving all 4 extremities. Psychiatric: Normal mood and affect.  Laboratory Data: Lab Results  Component Value Date   WBC 4.3 10/31/2015   HGB 13.6 10/31/2015   HCT 40.9 10/31/2015   MCV 91.0 10/31/2015   PLT 190 10/31/2015   Lab Results  Component Value Date   CREATININE 0.94 10/31/2015   Lab  Results  Component Value Date   HGBA1C 7.0 09/02/2015   Pertinent Imaging CLINICAL DATA: Microscopic hematuria  EXAM: RENAL / URINARY TRACT ULTRASOUND COMPLETE  COMPARISON: Abdominal and pelvic CT scan without contrast dated November 12, 2014.  FINDINGS: Right Kidney:  Length: 9.2 cm. Echogenicity within normal limits. No mass or hydronephrosis visualized.  Left Kidney:  Length: 10.0 cm. There are multiple renal parenchymal cysts on the left. The faintly calcified structure in the posterior aspect of the midpole seen on the previous study is not clearly evident on today's ultrasound. There is a midpole cyst measuring 1.5 x 1.6 x 1.8 cm. There is an upper pole cyst measuring 0.9 cm in diameter. There is a lower pole cyst measuring 1.1 cm in diameter. There is no hydronephrosis.  Bladder:  The urinary bladder is nondistended and poorly evaluated.  IMPRESSION: 1. There are simple appearing cysts in the left kidney. The radiodense structure in the midpole of the right kidney demonstrated on the previous CT scan is not clearly evident on today's ultrasound. 2. The right kidney is unremarkable. 3. The urinary bladder is not well evaluated on today's study. 4. If there is persistent microscopic hematuria and the urinary bladder has been cleared, MRI of the kidneys would be a useful next imaging step in an effort to evaluate the known hyperdense midpole lesion on the left which is not evident on today's ultrasound.   Electronically Signed  By: David Martinique M.D.  On: 07/19/2015 14:29          Urinalysis: Results for orders placed or performed in visit on 11/24/15  CULTURE, URINE COMPREHENSIVE  Result Value Ref Range   Urine Culture, Comprehensive Final report    Result 1 Comment   Microscopic Examination  Result Value Ref Range   WBC, UA 11-30 (A) 0 -  5 /hpf   RBC, UA None seen 0 -  2 /hpf   Epithelial Cells (non renal) >10 (A) 0 - 10 /hpf    Casts Present (A) None seen /lpf   Cast Type Hyaline casts N/A   Bacteria, UA Few None seen/Few  Urinalysis, Complete  Result Value Ref Range   Specific Gravity, UA 1.025 1.005 - 1.030   pH, UA 5.5 5.0 - 7.5   Color, UA Green (A) Yellow   Appearance Ur Hazy (A) Clear   Leukocytes, UA 1+ (A) Negative   Protein, UA 2+ (A) Negative/Trace   Glucose, UA Negative Negative   Ketones, UA Negative Negative   RBC, UA Negative Negative   Bilirubin, UA Negative Negative   Urobilinogen, Ur 0.2 0.2 - 1.0 mg/dL   Nitrite, UA Negative Negative   Microscopic Examination See below:     Assessment & Plan:    1. Interstitial cystitis:  Patient's rescue installation is held today.  Urine will be sent for culture.   Follow-up will be based on urine culture results.   2. Suspicious lesions in the T 7 vertebral body:   Patient sclerosis of T7 vertebral body is felt to be more likely from osteoporosis/compression fracture. No further evaluation at this time is recommended unless clinically her symptoms merit further  imaging.  3. Recurrent UTI's:   I will be sending patient's urine for culture. I will not start an antibiotic at this time. She will contact our office or seek treatment in the emergency room she should develop fever/chills or her urinary symptoms worsen.   Return for follow up pending urine culture results.  Zara Council, [facility] [address], [place], [place] [address] [phone]

## 2015-11-29 ENCOUNTER — Telehealth: Payer: Self-pay

## 2015-11-29 NOTE — Telephone Encounter (Signed)
-----   Message from Nori Riis, PA-C sent at 11/27/2015  6:52 PM EDT ----- Patient did not have a urinary tract infection.  Continue with rescue solutions.

## 2015-11-29 NOTE — Telephone Encounter (Signed)
LMOM- -ucx can continue rescue solutions.

## 2015-11-30 ENCOUNTER — Encounter: Payer: Self-pay | Admitting: Urology

## 2015-11-30 ENCOUNTER — Ambulatory Visit (INDEPENDENT_AMBULATORY_CARE_PROVIDER_SITE_OTHER): Payer: Commercial Managed Care - HMO | Admitting: Urology

## 2015-11-30 VITALS — BP 168/69 | HR 76 | Ht 61.0 in | Wt 158.3 lb

## 2015-11-30 DIAGNOSIS — N390 Urinary tract infection, site not specified: Secondary | ICD-10-CM

## 2015-11-30 DIAGNOSIS — W19XXXA Unspecified fall, initial encounter: Secondary | ICD-10-CM

## 2015-11-30 DIAGNOSIS — N3010 Interstitial cystitis (chronic) without hematuria: Secondary | ICD-10-CM | POA: Diagnosis not present

## 2015-11-30 DIAGNOSIS — R296 Repeated falls: Secondary | ICD-10-CM

## 2015-11-30 LAB — URINALYSIS, COMPLETE
Bilirubin, UA: NEGATIVE
Glucose, UA: NEGATIVE
Ketones, UA: NEGATIVE
Nitrite, UA: NEGATIVE
Specific Gravity, UA: 1.025 (ref 1.005–1.030)
Urobilinogen, Ur: 0.2 mg/dL (ref 0.2–1.0)
pH, UA: 6 (ref 5.0–7.5)

## 2015-11-30 LAB — MICROSCOPIC EXAMINATION: Epithelial Cells (non renal): >10 /HPF — AB (ref 0–10)

## 2015-11-30 MED ORDER — SODIUM BICARBONATE 8.4 % IV SOLN
11.0000 mL | Freq: Once | INTRAVENOUS | Status: AC
  Administered 2015-11-30: 11 mL

## 2015-11-30 NOTE — Progress Notes (Signed)
11:14 AM   [name] 1929/12/17 [id number]  Referring provider: Arnetha Courser, MD [address] [place], [place] [address]  Chief Complaint  Patient presents with  . Cystitis    Rescue Solution    HPI: Patient is an 82 year old white female with a history of IC who presents today for rescue installation.     At her last visit 1 week ago, her rescue solution was held due to the patient's feeling of general malaise and possible urinary tract infection. Urine was sent for culture at that time and culture results were negative.  Since we have last seen her, she states she has fallen 2-3 times. She currently has appointments pending with her cardiologist and physical therapist.    Today, she states she is having frequency of urination, urgency dysuria and nocturia.  She denies fever, chills, nausea or vomiting. Her UA today is positive for 11-30 WBC's per high-power field.  This seems to be her baseline.  PMH: Past Medical History  Diagnosis Date  . GERD (gastroesophageal reflux disease)   . Vertigo   . Essential hypertension, benign   . Paroxysmal supraventricular tachycardia ([place])   . Pure hypercholesterolemia   . MS (multiple sclerosis) ([place])   . Fibromyalgia   . Diverticulosis   . Diabetes mellitus without complication ([place])   . Osteoarthritis   . Dyslipidemia   . SOB (shortness of breath)     SECONDARY TO REFLUX  . Chest pain     SECONDARY TO GERD  . Peripheral neuropathy (HCC)     MILD  . Cancer ([place])     breast  . Depression   . Chronic interstitial cystitis   . History of fractured rib   . Chronic right hip pain   . History of TIA (transient ischemic attack)   . Carpal tunnel syndrome   . Thyroid nodule   . Hearing loss of left ear   . Vitamin B 12 deficiency   . Protein malnutrition ([place])   . Frequent falls   . Anxiety and depression   . Irritable bowel   . Recurrent UTI     Surgical History: Past Surgical History  Procedure  Laterality Date  . Total abdominal hysterectomy w/ bilateral salpingoophorectomy    . Mastectomy Right     DX MASTITIS/NO CANCER.Marland KitchenSALINE IMPLANT  . Cholecystectomy    . Hemorrhoid surgery      WITH RECONSTRUCTION  . Bladder tacking      X 3  . Eye surgeries      WITH BUCKLE DETACHMENT OF THE RETINA  . Tonsillectomy    . Appendectomy    . Tympanoplasty    . Breast surgery    . Mastectomy    . Breast lumpectomy    . Colonoscopy with propofol N/A 01/21/2015    Procedure: COLONOSCOPY WITH PROPOFOL;  Surgeon: Josefine Class, MD;  Location[facility] END[facility]COPY;  Service: Endoscopy;  Laterality: N/A;  . Esophagogastroduodenoscopy N/A 01/21/2015    Procedure: ESOPHAGOGASTRODUODENOSCOPY (EGD);  Surgeon: Josefine Class, MD;  Location: [facility] ENDOSCOPY;  Service: Endoscopy;  Laterality: N/A;    Home Medications:    Medication List       This list is accurate as of: 11/30/15 11:14 AM.  Always use your most recent med list.               aspirin EC 81 MG tablet  Take 1 tablet (81 mg total) by mouth daily.  CVS VITAMIN B12 2000 MCG tablet  Generic drug:  cyanocobalamin  Take 2,000 mcg by mouth daily.     dicyclomine 10 MG capsule  Commonly known as:  BENTYL  Take 10 mg by mouth 4 (four) times daily -  before meals and at bedtime.     ferrous sulfate 325 (65 FE) MG tablet  Take 1 tablet (325 mg total) by mouth daily with breakfast.     fluticasone 50 MCG/ACT nasal spray  Commonly known as:  FLONASE  SPRAY 2 SPRAYS EACH INTO BOTH NOSTRILS ONCE DAILY EACH NIGHT.     GAS-X EXTRA STRENGTH 125 MG Caps  Generic drug:  Simethicone  Take 1 capsule by mouth as needed. Reported on 11/24/2015     GENTEAL OP  Apply 1 drop to eye. Each eye as needed     hydrocortisone 25 MG suppository  Commonly known as:  ANUSOL-HC  Place 1 suppository (25 mg total) rectally 2 (two) times daily.     loperamide 2 MG tablet  Commonly known as:  IMODIUM A-D  Take 2 mg by mouth 4 (four) times  daily as needed for diarrhea or loose stools.     meclizine 25 MG tablet  Commonly known as:  ANTIVERT  TAKE ONE TO TWO TABLETS BY MOUTH EVERY 8 HOURS AS NEEDED     metFORMIN 500 MG tablet  Commonly known as:  GLUCOPHAGE  Take 1 tablet (500 mg total) by mouth daily with breakfast.     NON FORMULARY  Reported on 11/30/2015     omeprazole 40 MG capsule  Commonly known as:  PRILOSEC     ondansetron 4 MG tablet  Commonly known as:  ZOFRAN  Take 4 mg by mouth every 8 (eight) hours as needed for nausea or vomiting. Reported on 11/30/2015     polyethylene glycol packet  Commonly known as:  MIRALAX / GLYCOLAX  Take 17 g by mouth daily as needed. Reported on 11/15/2015     PROBIOTIC COLON SUPPORT Caps  Take 1 capsule by mouth daily.     rosuvastatin 5 MG tablet  Commonly known as:  CRESTOR  Take 2.5 mg by mouth daily at 6 PM.     sertraline 50 MG tablet  Commonly known as:  ZOLOFT  Take 1 tablet (50 mg total) by mouth daily.     URIBEL 118 MG Caps  One tablet four times daily as needed with 8 oz of water     VITAMIN D-1000 MAX ST 1000 units tablet  Generic drug:  Cholecalciferol  Take 1,000 Units by mouth daily. Reported on 11/15/2015     Vitamins A & D 5000-400 units Caps  Take 1 capsule by mouth daily. Reported on 10/24/2015        Allergies:  Allergies  Allergen Reactions  . Biaxin [Clarithromycin] Other (See Comments) and Diarrhea  . Copaxone [Glatiramer Acetate]   . Decadron [Dexamethasone] Nausea And Vomiting and Other (See Comments)    Other reaction(s): Muscle Pain Reaction:  Abdominal pain  . Dexamethasone Sodium Phosphate Other (See Comments)  . Diltiazem Hcl Other (See Comments)    Reaction:  Unknown   . Diltiazem Hcl Other (See Comments)  . Flagyl [Metronidazole] Nausea Only and Diarrhea    Other reaction(s): Vomiting  . Gabapentin Other (See Comments)    "burning all over" Reaction:  Unknown   . Iohexol Swelling and Other (See Comments)     Desc:  tongue swelling with "IVP dye" sccording to nurses notes  with a lumbar myelo-12/09- asm, Onset Date: 12751700  Pts tongue swells.  Clementeen Hoof [Iodinated Diagnostic Agents] Swelling and Other (See Comments)    Passed out  Pts tongue swells.   . Levothyroxine Nausea Only  . Sulfa Antibiotics Other (See Comments)    Reaction:  Unknown   . Sulfonamide Derivatives Other (See Comments)    Reaction:  Unknown   . Synthroid [Levothyroxine Sodium]   . Copaxone  [Glatiramer] Rash  . Interferon Beta-1a Other (See Comments) and Rash    Reaction:  Unknown     Family History: Family History  Problem Relation Age of Onset  . Arthritis Sister   . Aneurysm Brother   . Heart disease Daughter   . ALS Mother   . Colon cancer    . Kidney disease Sister     Social History:  reports that she has never smoked. She has never used smokeless tobacco. She reports that she does not drink alcohol or use illicit drugs.  ROS: UROLOGY Frequent Urination?: Yes Hard to postpone urination?: Yes Burning/pain with urination?: Yes Get up at night to urinate?: Yes Leakage of urine?: No Urine stream starts and stops?: No Trouble starting stream?: No Do you have to strain to urinate?: No Blood in urine?: No Urinary tract infection?: No Sexually transmitted disease?: No Injury to kidneys or bladder?: No Painful intercourse?: No Weak stream?: No Currently pregnant?: No Vaginal bleeding?: No Last menstrual period?: n  Gastrointestinal Nausea?: No Vomiting?: No Indigestion/heartburn?: Yes Diarrhea?: Yes Constipation?: Yes  Constitutional Fever: No Night sweats?: Yes Weight loss?: No Fatigue?: Yes  Skin Skin rash/lesions?: No Itching?: No  Eyes Blurred vision?: Yes Double vision?: No  Ears/Nose/Throat Sore throat?: No Sinus problems?: Yes  Hematologic/Lymphatic Swollen glands?: No Easy bruising?: No  Cardiovascular Leg swelling?: No Chest pain?: No  Respiratory Cough?: No Shortness  of breath?: No  Endocrine Excessive thirst?: No  Musculoskeletal Back pain?: Yes Joint pain?: Yes  Neurological Headaches?: Yes Dizziness?: Yes  Psychologic Depression?: Yes Anxiety?: Yes  Physical Exam: Blood pressure 168/69, pulse 76, height _0  (1.549 m), weight 158 lb 4.8 oz (71.804 kg). Constitutional: Well nourished. Alert and oriented, No acute distress. HEENT: [place] AT, moist mucus membranes. Trachea midline, no masses. Cardiovascular: No clubbing, cyanosis, or edema. Respiratory: Normal respiratory effort, no increased work of breathing. GI: Abdomen is soft, non tender, non distended, no abdominal masses. GU: No CVA tenderness.  No bladder fullness or masses.   Skin: No rashes, bruises or suspicious lesions. Lymph: No cervical or inguinal adenopathy. Neurologic: Grossly intact, no focal deficits, moving all 4 extremities. Psychiatric: Normal mood and affect.  Laboratory Data: Lab Results  Component Value Date   WBC 4.3 10/31/2015   HGB 13.6 10/31/2015   HCT 40.9 10/31/2015   MCV 91.0 10/31/2015   PLT 190 10/31/2015   Lab Results  Component Value Date   CREATININE 0.94 10/31/2015   Lab Results  Component Value Date   HGBA1C 7.0 09/02/2015    Urinalysis: Results for orders placed or performed in visit on 11/30/15  Microscopic Examination  Result Value Ref Range   WBC, UA 11-30 (A) 0 -  5 /hpf   RBC, UA 0-2 0 -  2 /hpf   Epithelial Cells (non renal) >10 (A) 0 - 10 /hpf   Bacteria, UA Few None seen/Few  Urinalysis, Complete  Result Value Ref Range   Specific Gravity, UA 1.025 1.005 - 1.030   pH, UA 6.0 5.0 - 7.5  Color, UA Green (A) Yellow   Appearance Ur Clear Clear   Leukocytes, UA 1+ (A) Negative   Protein, UA 1+ (A) Negative/Trace   Glucose, UA Negative Negative   Ketones, UA Negative Negative   RBC, UA Trace (A) Negative   Bilirubin, UA Negative Negative   Urobilinogen, Ur 0.2 0.2 - 1.0 mg/dL   Nitrite, UA Negative Negative    Microscopic Examination See below:     Assessment & Plan:    1. Interstitial cystitis:  Patient's rescue installation is performed today.  She will return next week for her next treatment.  2. Recurrent UTI's:   UA at baseline today. We will continue to monitor. She will return next week and a urinalysis will be performed at that time.  3. Falling:   Patient has upcoming appointments with her other specialists for further evaluation for her falls.  We will need to assist the patient in on dressing and getting on exam tables at this time.   Return in about 1 week (around 12/07/2015) for rescue solution.  Zara Council, [facility] [address], [place], [place] [address] [phone]

## 2015-11-30 NOTE — Progress Notes (Signed)
Bladder Rescue Solution Instillation  Due to IC patient is present today for a Rescue Solution Treatment.  Patient was cleaned and prepped in a sterile fashion with betadine and lidocaine 2% jelly was instilled into the urethra.  A 14 FR catheter was inserted, urine return was noted 39ml, urine was blue/green in color.  Instilled a solution consisting of 47ml of Sodium Bicarb, 2 ml Lidocaine and 1 ml of Heparin. The catheter was then removed. Patient tolerated well, no complications were noted.   Performed by: Toniann Fail, LPN

## 2015-12-04 ENCOUNTER — Encounter: Payer: Self-pay | Admitting: Family Medicine

## 2015-12-04 DIAGNOSIS — R911 Solitary pulmonary nodule: Secondary | ICD-10-CM

## 2015-12-04 HISTORY — DX: Solitary pulmonary nodule: R91.1

## 2015-12-04 NOTE — Assessment & Plan Note (Signed)
With frequent falls; refer back to neuro

## 2015-12-04 NOTE — Assessment & Plan Note (Signed)
3 mm LLL lung nodule first noted June 2016; seen again March 2017; rescan March 2018

## 2015-12-04 NOTE — Assessment & Plan Note (Signed)
Hx of, with recent pain in chest wall; thorough breast exam by oncologist this week per patient; not repeated today

## 2015-12-04 NOTE — Assessment & Plan Note (Signed)
Referral back to neuro; fall precautions; continue to work with PT

## 2015-12-04 NOTE — Assessment & Plan Note (Signed)
Turmeric suggested; will refer to rheumatologist

## 2015-12-04 NOTE — Assessment & Plan Note (Signed)
With episode of BRBPR; has since resolved; checked out in ER; watch for any recurrence

## 2015-12-04 NOTE — Assessment & Plan Note (Signed)
Fair control today; DASH guidelines

## 2015-12-04 NOTE — Assessment & Plan Note (Signed)
Managed by urologist 

## 2015-12-06 ENCOUNTER — Other Ambulatory Visit: Payer: Self-pay | Admitting: *Deleted

## 2015-12-06 ENCOUNTER — Encounter: Payer: Self-pay | Admitting: *Deleted

## 2015-12-06 ENCOUNTER — Telehealth: Payer: Self-pay | Admitting: Family Medicine

## 2015-12-06 ENCOUNTER — Telehealth: Payer: Self-pay

## 2015-12-06 VITALS — BP 124/78 | HR 72 | Resp 18

## 2015-12-06 DIAGNOSIS — G35 Multiple sclerosis: Secondary | ICD-10-CM

## 2015-12-06 DIAGNOSIS — M1990 Unspecified osteoarthritis, unspecified site: Secondary | ICD-10-CM

## 2015-12-06 DIAGNOSIS — R296 Repeated falls: Secondary | ICD-10-CM

## 2015-12-06 DIAGNOSIS — E118 Type 2 diabetes mellitus with unspecified complications: Secondary | ICD-10-CM

## 2015-12-06 NOTE — Patient Outreach (Signed)
[facility]) Care Management   12/06/2015  [name] [birth year] [id number]  [name] is an 82 y.o. female  Subjective: " I am doing better with checking my blood sugar" Patient discussed the best way for her document blood sugar is directly on the day of the calendar. Patient discussed her recent visit with PCP and new consults with cardiology, orthopedic, neuro outpatient rehab, and neurology.   Objective:  BP 124/78 mmHg  Pulse 72  Resp 18  SpO2 97% Review of Systems  Constitutional: Negative.   HENT: Positive for hearing loss.        Left ear hearing loss  Eyes:       Limited vision  Respiratory: Negative.   Cardiovascular: Negative.  Negative for chest pain and leg swelling.  Gastrointestinal: Negative.   Genitourinary:       Interstitial cystis  Musculoskeletal: Positive for joint pain and falls.  Skin: Negative.   Endo/Heme/Allergies: Negative.   Psychiatric/Behavioral: Negative.     Physical Exam  Encounter Medications:   Outpatient Encounter Prescriptions as of 12/06/2015  Medication Sig Note  . aspirin EC 81 MG tablet Take 1 tablet (81 mg total) by mouth daily.   . Carboxymethylcell-Hypromellose (GENTEAL OP) Apply 1 drop to eye. Each eye as needed   . Cholecalciferol (VITAMIN D-1000 MAX ST) 1000 UNITS tablet Take 1,000 Units by mouth daily. Reported on 11/15/2015 08/15/2015: Takes 1 capsule daily at bedtime  . cyanocobalamin ([employer] VITAMIN B12) 2000 MCG tablet Take 2,000 mcg by mouth daily.  08/15/2015: Takes 1 tablet daily in the morning  . dicyclomine (BENTYL) 10 MG capsule Take 10 mg by mouth 4 (four) times daily -  before meals and at bedtime.   . ferrous sulfate 325 (65 FE) MG tablet Take 1 tablet (325 mg total) by mouth daily with breakfast.   . fluticasone (FLONASE) 50 MCG/ACT nasal spray SPRAY 2 SPRAYS EACH INTO BOTH NOSTRILS ONCE DAILY EACH NIGHT. 10/24/2015: Received from: External Pharmacy  . hydrocortisone (ANUSOL-HC) 25 MG  suppository Place 1 suppository (25 mg total) rectally 2 (two) times daily.   Marland Kitchen loperamide (IMODIUM A-D) 2 MG tablet Take 2 mg by mouth 4 (four) times daily as needed for diarrhea or loose stools.   . meclizine (ANTIVERT) 25 MG tablet TAKE ONE TO TWO TABLETS BY MOUTH EVERY 8 HOURS AS NEEDED   . metFORMIN (GLUCOPHAGE) 500 MG tablet Take 1 tablet (500 mg total) by mouth daily with breakfast.   . Meth-Hyo-M Bl-Na Phos-Ph Sal (URIBEL) 118 MG CAPS One tablet four times daily as needed with 8 oz of water 08/12/2015: Takes 1 capsule every morning  . NON FORMULARY Reported on 11/30/2015 11/24/2015: Received from: [place]  . omeprazole (PRILOSEC) 40 MG capsule  11/30/2015: Received from: External Pharmacy  . ondansetron (ZOFRAN) 4 MG tablet Take 4 mg by mouth every 8 (eight) hours as needed for nausea or vomiting. Reported on 11/30/2015   . polyethylene glycol (MIRALAX / GLYCOLAX) packet Take 17 g by mouth daily as needed. Reported on 11/15/2015   . Probiotic Product (PROBIOTIC COLON SUPPORT) CAPS Take 1 capsule by mouth daily.  08/15/2015: Takes 1 capsule by mouth daily at night  . rosuvastatin (CRESTOR) 5 MG tablet Take 2.5 mg by mouth daily at 6 PM.   . sertraline (ZOLOFT) 50 MG tablet Take 1 tablet (50 mg total) by mouth daily.   . Simethicone (GAS-X EXTRA STRENGTH) 125 MG CAPS Take 1 capsule by mouth as  needed. Reported on 11/24/2015   . Vitamins A & D 5000-400 units CAPS Take 1 capsule by mouth daily. Reported on 10/24/2015 10/24/2015: Received from: External Pharmacy   No facility-administered encounter medications on file as of 12/06/2015.    Functional Status:   In your present state of health, do you have any difficulty performing the following activities: 11/10/2015 10/14/2015  Hearing? [name]  Vision? Y Y  Difficulty concentrating or making decisions? Y N  Walking or climbing stairs? Y Y  Dressing or bathing? Y N  Doing errands, shopping? [name]  Preparing Food and eating ? Y -  Using  the Toilet? N -  In the past six months, have you accidently leaked urine? Y -  Do you have problems with loss of bowel control? Y -  Managing your Medications? Y -  Managing your Finances? Y -  Housekeeping or managing your Housekeeping? N -    Fall/Depression Screening:    PHQ 2/9 Scores 11/25/2015 11/10/2015 10/14/2015 10/12/2015 08/12/2015 05/12/2015 04/15/2015  PHQ - 2 Score _0 0 0  PHQ- 9 Score - - - - 12 - -    Assessment:    Diabetes: Patient checking blood sugar at least twice daily and recording it on [facility] calendar. No hypoglycemic episodes. Will benefit from continued follow up with health coach for education and management of Diabetes.  Frequent Falls Recent fall within the last week, no injury. Encouraged to use her walker at all times. Patient is scheduled to begin outpatient neuro physical therapy.  Patient has several MD specialty appointments scheduled, she will speak with her daughter today regarding setting up follow up appointment with ENT regarding left ear .  Plan:  Will refer back to Health Coach for diabetes education and management, patient has met goal of demonstrating proper use of using cbg meter to monitor blood sugars.   [facility] CM Care Plan Problem One        Most Recent Value   Care Plan Problem One  knowledge deficit in self management of diabetes   Role Documenting the Problem One  Care Management Coordinator   Care Plan for Problem One  Active   [facility] CM Short Term Goal #4 (0-30 days)  Patient will demonstrate proper use of glucose meter in the next 14 days    [facility] CM Short Term Goal #4 Start Date  11/15/15   [facility] CM Short Term Goal #4 Met Date  12/06/15     Joylene Draft, RN, [place] Management [phone]- Mobile [phone]- [place]

## 2015-12-06 NOTE — Telephone Encounter (Signed)
Patienjt states she is to have fasting labs? I do not see an order?

## 2015-12-06 NOTE — Telephone Encounter (Signed)
ALREADY HAS REFERRAL FOR PATIENT FOR HER [facility] FOR [facility] FOR HER BLOOD CHECKS. DOES NOT NEED ONE UNLESS IT IS SOMETHING ELSE YOU NEED THEM TO DO.

## 2015-12-07 ENCOUNTER — Ambulatory Visit (INDEPENDENT_AMBULATORY_CARE_PROVIDER_SITE_OTHER): Payer: Commercial Managed Care - HMO | Admitting: Cardiology

## 2015-12-07 ENCOUNTER — Encounter: Payer: Self-pay | Admitting: Cardiology

## 2015-12-07 VITALS — BP 144/74 | HR 80 | Ht 63.0 in | Wt 156.8 lb

## 2015-12-07 DIAGNOSIS — I471 Supraventricular tachycardia: Secondary | ICD-10-CM

## 2015-12-07 DIAGNOSIS — I10 Essential (primary) hypertension: Secondary | ICD-10-CM

## 2015-12-07 DIAGNOSIS — R296 Repeated falls: Secondary | ICD-10-CM

## 2015-12-07 DIAGNOSIS — I2510 Atherosclerotic heart disease of native coronary artery without angina pectoris: Secondary | ICD-10-CM | POA: Diagnosis not present

## 2015-12-07 NOTE — Patient Instructions (Signed)
Medication Instructions:  Your physician recommends that you continue on your current medications as directed. Please refer to the Current Medication list given to you today.   Labwork: none  Testing/Procedures: none  Follow-Up: Your physician recommends that you schedule a follow-up appointment as needed with Dr. Harding   Any Other Special Instructions Will Be Listed Below (If Applicable).     If you need a refill on your cardiac medications before your next appointment, please call your pharmacy.   

## 2015-12-07 NOTE — Progress Notes (Signed)
PATIENT: [name] MRN: [id number] DOB: 11-29-1929 PCP: Enid Derry, MD  Clinic Note: Chief Complaint  Patient presents with  . Advice Only    Consult: Pt has hx of tachycardia. Meds reviewed verbally with pt.    HPI: [name] is a 82 y.o. female with a PMH below who presents today for h/o PSVT & Coronary Ca on CT (10/2015)  [name] is a 82 y.o. female with a history of MS - with multiple falls, breast cancer (status post left-sided lumpectomy and right-sided mastectomy-, and paroxysmal SVT who has reestablish a new PCP, and is now being sent for another cardiac evaluation.   The patient moved from [place] to [place], [place] in May 2015. She initially saw Dr. Felicita Gage @ [facility]. She was seen by him once and he referred her to Electrophysiology Roderic Palau P. Piccini, MD).  She was describing having intermittent episodes of palpitations that have been going on for years. She had had several syncopal events, however nothing was documented as far as any arrhythmia. Apparently there is a diagnosis of PSVT and she had been noting palpitations dating back many years. In [place], she was started on flecainide that was symptomatically discontinued by her PCP. She had a REVEAL loop recorder implant for ambulatory heart rhythm monitoring. in November 2014, and syncopal events while this was in place confirmed no SVT noted during the event.The only main thing that was done by cardiologist from [place] was start beta blocker which she is no longer taking. The patient had a REVEAL device implanted in 06/2013. Interrogation by Dr. Ernest Haber on 03/03/2014 revealed a single episode of tachycardia on 5/24 at 8:42am lasting about 10 seconds. The rhythm is slightly irregular and thus may represent AF, although this is difficult to say for sure. Plan was to have the REVEAL monitor in for total of 3 years. She is post a follow-up last year with [facility], and canceled her appointment.   Prior to moving to [place], she lived near [place] and was followed by Dr. Fransico Him for general cardiology and Dr. Cristopher Peru for electrophysiology. She had a cardiac catheterization done that was negative.  She also endorse dizziness, which she attributes to her MS. She has a history of frequent falls mostly related to loss of balance. She says she falls out of bed falls down when sitting up from 1 to bed. There is no clear indication that she has loss of consciousness during these falls. She denies any palpitations associated with falls.  Interval History: [name] presents today for cardiology evaluation. She has various different chest discomfort symptoms that are per internal under the left breast and midaxillary in location. They occur with rest not necessarily associated with exertion. Exacerbated with deep inspiration. She says he does have tachycardia spells, but but there is no difference in the current ones in the 194. Nothing last more than a minute or 2. She is able to take a few deep breaths and stop the episodes. I imagine she remembers having been instructed on how to do some vagal maneuvers. She's not had any true syncopal symptoms but I can tell from her history. Her daughters underwhelmed by the nature of these palpitations stating that patient seems to be doing fairly well when she says she is having palpitations.  Additional cardiac review of symptoms: Cardiovascular ROS: positive for - Diffuse chest pain noted above, palpitations with short runs of rapid heartbeats. negative for - dyspnea on exertion, murmur, orthopnea, paroxysmal nocturnal  dyspnea, shortness of breath or She really doesn't do much walking indoors to truly note exertional dyspnea.  CARDIAC RISK FACTORS: Hyperlipidemia: yes. Hypertension: yes. No longer on prior 3 medications for her HBP. Diabetes Mellitus: yes . Tobacco: no . Obesity: Has lost 50 pounds over the past year (diverticulitis). . Sedentary  lifestyle: yes . Positive family history: no   Past Medical History  Diagnosis Date  . GERD (gastroesophageal reflux disease)   . Vertigo   . Essential hypertension, benign   . Paroxysmal supraventricular tachycardia ([place])   . Pure hypercholesterolemia   . MS (multiple sclerosis) ([place])   . Fibromyalgia   . Diverticulosis   . Diabetes mellitus without complication ([place])   . Osteoarthritis   . Dyslipidemia   . SOB (shortness of breath)     SECONDARY TO REFLUX  . Chest pain     SECONDARY TO GERD  . Peripheral neuropathy (HCC)     MILD  . Cancer ([place])     breast  . Depression   . Chronic interstitial cystitis   . History of fractured rib   . Chronic right hip pain   . History of TIA (transient ischemic attack)   . Carpal tunnel syndrome   . Thyroid nodule   . Hearing loss of left ear   . Vitamin B 12 deficiency   . Protein malnutrition ([place])   . Frequent falls   . Anxiety and depression   . Irritable bowel   . Recurrent UTI   . Solitary pulmonary nodule on lung CT 12/04/2015    3 mm LLL lung nodule June 2016, March 2017  . Tachycardia, paroxysmal (HCC)     Reportedly Paroxysmal Supraventricular Tachycardia, but unable to find documentation confirming this    Prior Cardiac Evaluation and Past Surgical History: Past Surgical History  Procedure Laterality Date  . Total abdominal hysterectomy w/ bilateral salpingoophorectomy    . Mastectomy Right     DX MASTITIS/NO CANCER.Marland KitchenSALINE IMPLANT  . Cholecystectomy    . Hemorrhoid surgery      WITH RECONSTRUCTION  . Bladder tacking      X 3  . Eye surgeries      WITH BUCKLE DETACHMENT OF THE RETINA  . Tonsillectomy    . Appendectomy    . Tympanoplasty    . Breast surgery    . Mastectomy    . Breast lumpectomy    . Colonoscopy with propofol N/A 01/21/2015    Procedure: COLONOSCOPY WITH PROPOFOL;  Surgeon: Josefine Class, MD;  Location: [facility] ENDOSCOPY;  Service: Endoscopy;  Laterality: N/A;  .  Esophagogastroduodenoscopy N/A 01/21/2015    Procedure: ESOPHAGOGASTRODUODENOSCOPY (EGD);  Surgeon: Josefine Class, MD;  Location[facility]COPY;  Service: Endoscopy;  Laterality: N/A;  . Cardiac catheterization  10/2001    Normal coronary arteries with the exception of 20% proximal D1    Allergies  Allergen Reactions  . Biaxin [Clarithromycin] Other (See Comments) and Diarrhea  . Copaxone [Glatiramer Acetate]   . Decadron [Dexamethasone] Nausea And Vomiting and Other (See Comments)    Other reaction(s): Muscle Pain Reaction:  Abdominal pain  . Dexamethasone Sodium Phosphate Other (See Comments)  . Diltiazem Hcl Other (See Comments)    Reaction:  Unknown   . Diltiazem Hcl Other (See Comments)  . Flagyl [Metronidazole] Nausea Only and Diarrhea    Other reaction(s): Vomiting  . Gabapentin Other (See Comments)    "burning all over" Reaction:  Unknown   . Iohexol Swelling and Other (See Comments)  Desc: tongue swelling with "IVP dye" sccording to nurses notes with a lumbar myelo-12/09- asm, Onset Date: [id number]  Pts tongue swells.  Clementeen Hoof [Iodinated Diagnostic Agents] Swelling and Other (See Comments)    Passed out  Pts tongue swells.   . Levothyroxine Nausea Only  . Sulfa Antibiotics Other (See Comments)    Reaction:  Unknown   . Sulfonamide Derivatives Other (See Comments)    Reaction:  Unknown   . Synthroid [Levothyroxine Sodium]   . Copaxone  [Glatiramer] Rash  . Interferon Beta-1a Other (See Comments) and Rash    Reaction:  Unknown     Current Outpatient Prescriptions  Medication Sig Dispense Refill  . aspirin EC 81 MG tablet Take 1 tablet (81 mg total) by mouth daily. 30 tablet 11  . b complex vitamins tablet Take 1 tablet by mouth daily.    . Carboxymethylcell-Hypromellose (GENTEAL OP) Apply 1 drop to eye. Each eye as needed    . Cholecalciferol (VITAMIN D-1000 MAX ST) 1000 UNITS tablet Take 1,000 Units by mouth daily. Reported on 12/06/2015    .  Cholecalciferol (VITAMIN D3) 50000 units CAPS Take 5,000 Int'l Units/day by mouth daily.    . cyanocobalamin (CVS VITAMIN B12) 2000 MCG tablet Take 2,000 mcg by mouth daily.     . ferrous sulfate 325 (65 FE) MG tablet Take 1 tablet (325 mg total) by mouth daily with breakfast. (Patient taking differently: Take 325 mg by mouth as needed. ) 90 tablet 1  . fluticasone (FLONASE) 50 MCG/ACT nasal spray SPRAY 2 SPRAYS EACH INTO BOTH NOSTRILS ONCE DAILY EACH NIGHT.  5  . hydrocortisone (ANUSOL-HC) 25 MG suppository Place 1 suppository (25 mg total) rectally 2 (two) times daily. 12 suppository 0  . loperamide (IMODIUM A-D) 2 MG tablet Take 2 mg by mouth 4 (four) times daily as needed for diarrhea or loose stools.    . meclizine (ANTIVERT) 25 MG tablet TAKE ONE TO TWO TABLETS BY MOUTH EVERY 8 HOURS AS NEEDED 40 tablet 1  . metFORMIN (GLUCOPHAGE) 500 MG tablet Take 1 tablet (500 mg total) by mouth daily with breakfast. 90 tablet 2  . Meth-Hyo-M Bl-Na Phos-Ph Sal (URIBEL) 118 MG CAPS One tablet four times daily as needed with 8 oz of water 25 capsule 3  . omeprazole (PRILOSEC) 40 MG capsule Take 40 mg by mouth daily.     . ondansetron (ZOFRAN) 4 MG tablet Take 4 mg by mouth every 8 (eight) hours as needed for nausea or vomiting. Reported on 11/30/2015    . polyethylene glycol (MIRALAX / GLYCOLAX) packet Take 17 g by mouth daily as needed. Reported on 11/15/2015    . Probiotic Product (PROBIOTIC COLON SUPPORT) CAPS Take 1 capsule by mouth daily.     . rosuvastatin (CRESTOR) 5 MG tablet Take 2.5 mg by mouth daily at 6 PM.    . sertraline (ZOLOFT) 50 MG tablet Take 1 tablet (50 mg total) by mouth daily. 90 tablet 2  . Simethicone (GAS-X EXTRA STRENGTH) 125 MG CAPS Take 1 capsule by mouth as needed. Reported on 11/24/2015    . Vitamins A & D 5000-400 units CAPS Take 1 capsule by mouth daily. Reported on 12/06/2015     No current facility-administered medications for this visit.   Social History   Social History    . Marital Status: Married    Spouse Name: Marcello Moores  . Number of Children: 4  . Years of Education: N/A   Occupational History  . 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 Smoking status: Never Smoker   . Smokeless tobacco: Never Used  . Alcohol Use: No  . Drug Use: No  . Sexual Activity: No   Other Topics Concern  . None   Social History Narrative   1 SON, 3 DAUGHTER'S   15 GRANDCHILDREN   LIVING AT [facility]   Family History family history includes ALS in her mother; Aneurysm in her brother; Arthritis in her sister; Heart disease in her daughter; Kidney disease in her sister.  ROS: A comprehensive Review of Systems -  Review of Systems  Constitutional: Negative for fever, chills and malaise/fatigue.  HENT: Negative for congestion.   Respiratory: Negative for cough and shortness of breath.   Cardiovascular: Positive for leg swelling (Mild). Negative for claudication.  Gastrointestinal: Positive for heartburn. Negative for constipation, blood in stool and melena.  Genitourinary: Negative for hematuria.  Musculoskeletal: Positive for myalgias, back pain and joint pain (Knees, hips, ankles).  Neurological: Positive for dizziness (Sounds vertebrobasilar) and weakness (Global). Negative for focal weakness, seizures, loss of consciousness and headaches.  Endo/Heme/Allergies: Bruises/bleeds easily.  Psychiatric/Behavioral: Positive for memory loss. Negative for depression. The patient is not nervous/anxious and does not have insomnia.   All other systems reviewed and are negative.   PHYSICAL EXAM BP 144/74 mmHg  Pulse 80  Ht 5' 3"  (1.6 m)  Wt 156 lb 12 oz (71.101 kg)  BMI 27.77 kg/m2 General appearance: alert, cooperative, appears stated age, no distress and Well-nourished and well-groomed Neck: no adenopathy, no carotid bruit, no JVD, supple, symmetrical, trachea midline and Right-sided thyroid is slightly enlarged. Lungs: clear to  auscultation bilaterally, normal percussion bilaterally and Nonlabored, good air movement Heart: RRR with normal S1 and S2. Soft S4, but no S3, M/R. nondisplaced PMI. Abdomen: Soft with mild epigastric and left subcostal/upper quadrant tenderness along the lower rib cage.  NABS.Marland Kitchen No HSM Extremities: No clubbing or cyanosis. Trace edema. Pulses: 2+ and symmetric Skin: Skin color, texture, turgor normal. No rashes or lesions Neurologic: Mental status: Alert, oriented, thought content appropriate, Despite this, she deathly has deferring most questions to her daughter. She is very slow to answer questions Cranial nerves: normal   Adult ECG Report  Rate: 80 ;  Rhythm: normal sinus rhythm, premature ventricular contractions (PVC) and Nonspecific ST-T wave changes  QRS Axis: -4 ;  PR Interval: 140 ;  QRS Duration: 84 ; QTc: 449;  Voltages: Normal  Conduction Disturbances: none;  Other Abnormalities: Nonspecific ST-T wave abnormalities  Narrative Interpretation: Essentially normal EKG.  Recent Labs:   Chemistry      Component Value Date/Time   NA 139 10/31/2015 1208   NA 140 11/12/2014 1650   K 4.2 10/31/2015 1208   CL 107 10/31/2015 1208   CO2 23 10/31/2015 1208   BUN 20 10/31/2015 1208   CREATININE 0.94 10/31/2015 1208      Component Value Date/Time   CALCIUM 9.7 10/31/2015 1208   ALKPHOS 45 10/31/2015 1208   AST 13* 10/31/2015 1208   ALT 21 10/31/2015 1208   BILITOT 0.7 10/31/2015 1208      ASSESSMENT / PLAN: Very pleasant 82 year old woman with multiple other comorbidities besides cardiac issues. She canceled her cardiology follow-up with [facility] in order to discuss potentially removing the REVEAL monitor last year. She has this documented a history of palpitations, and would not be unreasonable to interrogate the monitor. However I don't have the ability to do so.  At this point I'm not sure if the device would still be holding his memory. I will discuss with our electrophysiology  physicians as to how we manage the situation.  As for her symptoms, I don't think that there is need documentation that her pass out spells or falls which over they are related to arrhythmia. She may have some neurocardiogenic type of near syncope and orthostatic changes. At this point I think adequate hydration to avoid exacerbating tachycardia is reasonable. He does not want take any medicines and I'm fine not putting on a beta blocker since she did not take it to begin with.  She has different episodes of chest pain which are mostly chest wall pain and not likely to be related to anything cardiac in nature. She barely does any walking around to speak of. I had a long talk with the patient and her daughter and we pretty much agreed that it would probably best not to investigate these chest wall symptoms with any type of ischemic evaluation because this would potentially open up a box of information that they would not be interested in. She would not want to have any invasive procedures done, which essentially obviates the need for having a stress test done.  At this point think from a cardiac standpoint I will follow-up on the question of the loop recorder explant, otherwise I think she can follow-up with cardiology on an as-needed basis and just maintain care with her primary physician.  Problem List Items Addressed This Visit    Paroxysmal supraventricular tachycardia ([place]) - Primary (Chronic)    Reportedly diagnosed years ago. Unable to find confirmation. REVEAL loop recorder in place with plans to have been removed in 2016. We'll discuss with electrophysiology determine if, and how this would be done Also if the device could be interrogated again.      Hypertension goal BP (blood pressure) < 140/90 (Chronic)    Borderline today. I agree I would not like to treat her with any medications. She has falling episodes which may be orthostatic in nature. Would be very reluctant to initiate any  antihypertensive therapy for this level of blood pressure      Frequent falls (Chronic)    These don't sound to be related to cardiac problems besides maybe neurocardiogenic orthostatic symptoms. Would allow for mild permissive hypertension. Because of her multiple falls, she would not be a great candidate for PCI of any potential coronary disease.  Without MI, we decided that we would not want to pursue ischemic evaluation unless she is having symptomatic symptoms of angina Continue to work with PT. Falls precautions are warranted.      Coronary artery calcification seen on CAT scan (Chronic)    Relatively asymptomatic. She has some chest wall type pain, no concerning anginal type symptoms. Would not recommend evaluating with a stress test as she would not want to proceed with any further invasive evaluation beyond that unless she is truly having symptoms  Continue risk factor modification         No orders of the defined types were placed in this encounter.    Followup: When necessary  I spent close to 40 minutes in direct face-to-face consultation with his patient and an additional 30+ minutes in chart review including care everywhere, and old notes from scanned reports in Epic.  Total time 70-80 minutes. > 50% of the time was in direct consultation discussing the different findings and recommendations for treatment. We also discussed her goals of  care.   Leonie Man, M.D., M.S.  [employer]  [address] [place],  [address] [phone] Fax [phone]

## 2015-12-08 ENCOUNTER — Encounter: Payer: Self-pay | Admitting: Urology

## 2015-12-08 ENCOUNTER — Ambulatory Visit (INDEPENDENT_AMBULATORY_CARE_PROVIDER_SITE_OTHER): Payer: Commercial Managed Care - HMO | Admitting: Urology

## 2015-12-08 VITALS — BP 145/83 | HR 83 | Ht 63.0 in | Wt 157.8 lb

## 2015-12-08 DIAGNOSIS — W19XXXA Unspecified fall, initial encounter: Secondary | ICD-10-CM

## 2015-12-08 DIAGNOSIS — N390 Urinary tract infection, site not specified: Secondary | ICD-10-CM | POA: Diagnosis not present

## 2015-12-08 DIAGNOSIS — N3010 Interstitial cystitis (chronic) without hematuria: Secondary | ICD-10-CM

## 2015-12-08 DIAGNOSIS — R296 Repeated falls: Secondary | ICD-10-CM

## 2015-12-08 LAB — MICROSCOPIC EXAMINATION: Epithelial Cells (non renal): >10 /hpf — AB (ref 0–10)

## 2015-12-08 LAB — URINALYSIS, COMPLETE
BILIRUBIN UA: NEGATIVE
GLUCOSE, UA: NEGATIVE
Nitrite, UA: NEGATIVE
PH UA: 5.5 (ref 5.0–7.5)
RBC UA: NEGATIVE
Specific Gravity, UA: >1.03 — ABNORMAL HIGH (ref 1.005–1.030)
Urobilinogen, Ur: 0.2 mg/dL (ref 0.2–1.0)

## 2015-12-08 MED ORDER — SODIUM BICARBONATE 8.4 % IV SOLN
11.0000 mL | Freq: Once | INTRAVENOUS | Status: AC
  Administered 2015-12-08: 11 mL

## 2015-12-08 NOTE — Progress Notes (Signed)
10:45 AM   [name] July 19, 1930 [id number]  Referring provider: Arnetha Courser, MD [address] [place], [place] [address]  Chief Complaint  Patient presents with  . Other    rescue solution    HPI: Patient is an 82 year old white female with a history of IC who presents today for rescue installation.     Patient has been experiencing falls recently. She has been evaluated by cardiology and they feel that this is not of a cardiac nature.   She currently has an appointment with physical therapist.    Today, she states she is having frequency of urination, urgency dysuria and nocturia.  She denies fever, chills, nausea or vomiting. Her UA today is positive for 3-10 RBC's per high-power field.    PMH: Past Medical History  Diagnosis Date  . GERD (gastroesophageal reflux disease)   . Vertigo   . Essential hypertension, benign   . Paroxysmal supraventricular tachycardia ([place])   . Pure hypercholesterolemia   . MS (multiple sclerosis) ([place])   . Fibromyalgia   . Diverticulosis   . Diabetes mellitus without complication ([place])   . Osteoarthritis   . Dyslipidemia   . SOB (shortness of breath)     SECONDARY TO REFLUX  . Chest pain     SECONDARY TO GERD  . Peripheral neuropathy (HCC)     MILD  . Cancer ([place])     breast  . Depression   . Chronic interstitial cystitis   . History of fractured rib   . Chronic right hip pain   . History of TIA (transient ischemic attack)   . Carpal tunnel syndrome   . Thyroid nodule   . Hearing loss of left ear   . Vitamin B 12 deficiency   . Protein malnutrition ([place])   . Frequent falls   . Anxiety and depression   . Irritable bowel   . Recurrent UTI   . Solitary pulmonary nodule on lung CT 12/04/2015    3 mm LLL lung nodule June 2016, March 2017    Surgical History: Past Surgical History  Procedure Laterality Date  . Total abdominal hysterectomy w/ bilateral salpingoophorectomy    . Mastectomy Right     DX  MASTITIS/NO CANCER.Marland KitchenSALINE IMPLANT  . Cholecystectomy    . Hemorrhoid surgery      WITH RECONSTRUCTION  . Bladder tacking      X 3  . Eye surgeries      WITH BUCKLE DETACHMENT OF THE RETINA  . Tonsillectomy    . Appendectomy    . Tympanoplasty    . Breast surgery    . Mastectomy    . Breast lumpectomy    . Colonoscopy with propofol N/A 01/21/2015    Procedure: COLONOSCOPY WITH PROPOFOL;  Surgeon: Josefine Class, MD;  Location: [facility] ENDOSCOPY;  Service: Endoscopy;  Laterality: N/A;  . Esophagogastroduodenoscopy N/A 01/21/2015    Procedure: ESOPHAGOGASTRODUODENOSCOPY (EGD);  Surgeon: Josefine Class, MD;  Location: [facility] ENDOSCOPY;  Service: Endoscopy;  Laterality: N/A;  . Cardiac catheterization      Home Medications:    Medication List       This list is accurate as of: 12/08/15 10:45 AM.  Always use your most recent med list.               aspirin EC 81 MG tablet  Take 1 tablet (81 mg total) by mouth daily.     b complex vitamins tablet  Take  1 tablet by mouth daily.     [employer] VITAMIN B12 2000 MCG tablet  Generic drug:  cyanocobalamin  Take 2,000 mcg by mouth daily.     ferrous sulfate 325 (65 FE) MG tablet  Take 1 tablet (325 mg total) by mouth daily with breakfast.     fluticasone 50 MCG/ACT nasal spray  Commonly known as:  FLONASE  SPRAY 2 SPRAYS EACH INTO BOTH NOSTRILS ONCE DAILY EACH NIGHT.     GAS-X EXTRA STRENGTH 125 MG Caps  Generic drug:  Simethicone  Take 1 capsule by mouth as needed. Reported on 11/24/2015     GENTEAL OP  Apply 1 drop to eye. Each eye as needed     hydrocortisone 25 MG suppository  Commonly known as:  ANUSOL-HC  Place 1 suppository (25 mg total) rectally 2 (two) times daily.     loperamide 2 MG tablet  Commonly known as:  IMODIUM A-D  Take 2 mg by mouth 4 (four) times daily as needed for diarrhea or loose stools.     meclizine 25 MG tablet  Commonly known as:  ANTIVERT  TAKE ONE TO TWO TABLETS BY MOUTH EVERY 8 HOURS AS  NEEDED     metFORMIN 500 MG tablet  Commonly known as:  GLUCOPHAGE  Take 1 tablet (500 mg total) by mouth daily with breakfast.     omeprazole 40 MG capsule  Commonly known as:  PRILOSEC  Take 40 mg by mouth daily.     ondansetron 4 MG tablet  Commonly known as:  ZOFRAN  Take 4 mg by mouth every 8 (eight) hours as needed for nausea or vomiting. Reported on 11/30/2015     polyethylene glycol packet  Commonly known as:  MIRALAX / GLYCOLAX  Take 17 g by mouth daily as needed. Reported on 11/15/2015     PROBIOTIC COLON SUPPORT Caps  Take 1 capsule by mouth daily.     rosuvastatin 5 MG tablet  Commonly known as:  CRESTOR  Take 2.5 mg by mouth daily at 6 PM.     sertraline 50 MG tablet  Commonly known as:  ZOLOFT  Take 1 tablet (50 mg total) by mouth daily.     URIBEL 118 MG Caps  One tablet four times daily as needed with 8 oz of water     VITAMIN D-1000 MAX ST 1000 units tablet  Generic drug:  Cholecalciferol  Take 1,000 Units by mouth daily. Reported on 12/06/2015     Vitamin D3 50000 units Caps  Take 5,000 Int'l Units/day by mouth daily.     Vitamins A & D 5000-400 units Caps  Take 1 capsule by mouth daily. Reported on 12/06/2015        Allergies:  Allergies  Allergen Reactions  . Biaxin [Clarithromycin] Other (See Comments) and Diarrhea  . Copaxone [Glatiramer Acetate]   . Decadron [Dexamethasone] Nausea And Vomiting and Other (See Comments)    Other reaction(s): Muscle Pain Reaction:  Abdominal pain  . Dexamethasone Sodium Phosphate Other (See Comments)  . Diltiazem Hcl Other (See Comments)    Reaction:  Unknown   . Diltiazem Hcl Other (See Comments)  . Flagyl [Metronidazole] Nausea Only and Diarrhea    Other reaction(s): Vomiting  . Gabapentin Other (See Comments)    "burning all over" Reaction:  Unknown   . Iohexol Swelling and Other (See Comments)     Desc: tongue swelling with "IVP dye" sccording to nurses notes with a lumbar myelo-12/09- asm, Onset Date:  [id number]  Pts tongue swells.  Clementeen Hoof [Iodinated Diagnostic Agents] Swelling and Other (See Comments)    Passed out  Pts tongue swells.   . Levothyroxine Nausea Only  . Sulfa Antibiotics Other (See Comments)    Reaction:  Unknown   . Sulfonamide Derivatives Other (See Comments)    Reaction:  Unknown   . Synthroid [Levothyroxine Sodium]   . Copaxone  [Glatiramer] Rash  . Interferon Beta-1a Other (See Comments) and Rash    Reaction:  Unknown     Family History: Family History  Problem Relation Age of Onset  . Arthritis Sister   . Aneurysm Brother   . Heart disease Daughter   . ALS Mother   . Colon cancer    . Kidney disease Sister     Social History:  reports that she has never smoked. She has never used smokeless tobacco. She reports that she does not drink alcohol or use illicit drugs.  ROS: UROLOGY Frequent Urination?: No Hard to postpone urination?: No Burning/pain with urination?: Yes Get up at night to urinate?: Yes Leakage of urine?: Yes Urine stream starts and stops?: No Trouble starting stream?: No Do you have to strain to urinate?: No Blood in urine?: No Urinary tract infection?: No Sexually transmitted disease?: No Injury to kidneys or bladder?: No Painful intercourse?: No Weak stream?: No Currently pregnant?: No Vaginal bleeding?: No Last menstrual period?: n  Gastrointestinal Nausea?: No Vomiting?: No Indigestion/heartburn?: Yes Diarrhea?: Yes Constipation?: Yes  Constitutional Fever: No Night sweats?: Yes Weight loss?: No Fatigue?: Yes  Skin Skin rash/lesions?: No Itching?: No  Eyes Blurred vision?: Yes Double vision?: No  Ears/Nose/Throat Sore throat?: No Sinus problems?: Yes  Hematologic/Lymphatic Swollen glands?: No Easy bruising?: No  Cardiovascular Leg swelling?: Yes Chest pain?: No  Respiratory Cough?: No Shortness of breath?: No  Endocrine Excessive thirst?: No  Musculoskeletal Back pain?: Yes Joint pain?:  Yes  Neurological Headaches?: Yes Dizziness?: Yes  Psychologic Depression?: Yes Anxiety?: Yes  Physical Exam: Blood pressure 145/83, pulse 83, height _0  (1.6 m), weight 157 lb 12.8 oz (71.578 kg). Constitutional: Well nourished. Alert and oriented, No acute distress. HEENT: [place] AT, moist mucus membranes. Trachea midline, no masses. Cardiovascular: No clubbing, cyanosis, or edema. Respiratory: Normal respiratory effort, no increased work of breathing. GI: Abdomen is soft, non tender, non distended, no abdominal masses. GU: No CVA tenderness.  No bladder fullness or masses.   Skin: No rashes, bruises or suspicious lesions. Lymph: No cervical or inguinal adenopathy. Neurologic: Grossly intact, no focal deficits, moving all 4 extremities. Psychiatric: Normal mood and affect.  Laboratory Data: Lab Results  Component Value Date   WBC 4.3 10/31/2015   HGB 13.6 10/31/2015   HCT 40.9 10/31/2015   MCV 91.0 10/31/2015   PLT 190 10/31/2015   Lab Results  Component Value Date   CREATININE 0.94 10/31/2015   Lab Results  Component Value Date   HGBA1C 7.0 09/02/2015    Urinalysis: Results for orders placed or performed in visit on 12/08/15  Microscopic Examination  Result Value Ref Range   WBC, UA 0-5 0 -  5 /hpf   RBC, UA 3-10 (A) 0 -  2 /hpf   Epithelial Cells (non renal) >10 (A) 0 - 10 /hpf   Bacteria, UA Few None seen/Few  Urinalysis, Complete  Result Value Ref Range   Specific Gravity, UA >1.030 (H) 1.005 - 1.030   pH, UA 5.5 5.0 - 7.5   Color, UA Green (A) Yellow   Appearance  Ur Clear Clear   Leukocytes, UA Trace (A) Negative   Protein, UA 1+ (A) Negative/Trace   Glucose, UA Negative Negative   Ketones, UA Trace (A) Negative   RBC, UA Negative Negative   Bilirubin, UA Negative Negative   Urobilinogen, Ur 0.2 0.2 - 1.0 mg/dL   Nitrite, UA Negative Negative   Microscopic Examination See below:     Assessment & Plan:    1. Interstitial cystitis:  Patient's  rescue installation is performed today.  She will return next week for her next treatment.  3-10 RBC's/hpf were noted on today's UA.  This may be the result of instrumentation.  We will continue to monitor.    2. Recurrent UTI's:   UA at baseline today. We will continue to monitor. She will return next week and a urinalysis will be performed at that time.  3. Falling:   Patient has been evaluated by cardiology and they do not feel the falls other results of a cardiac issue.  We will need to assist the patient in on dressing and getting on exam tables at this time.   No Follow-up on file.  Zara Council, [facility] [address] [place], [place] [address] [phone]

## 2015-12-08 NOTE — Progress Notes (Signed)
Bladder Rescue Solution Instillation  Due to IC patient is present today for a Rescue Solution Treatment.  Patient was cleaned and prepped in a sterile fashion with betadine and lidocaine 2% jelly was instilled into the urethra.  A 14 FR catheter was inserted, urine return was noted 133ml, urine was blue/green in color.  Instilled a solution consisting of 59ml of Sodium Bicarb, 2 ml Lidocaine and 1 ml of Heparin. The catheter was then removed. Patient tolerated well, no complications were noted.   Performed by: Toniann Fail, LPN

## 2015-12-09 ENCOUNTER — Encounter: Payer: Self-pay | Admitting: Cardiology

## 2015-12-09 NOTE — Assessment & Plan Note (Signed)
Borderline today. I agree I would not like to treat her with any medications. She has falling episodes which may be orthostatic in nature. Would be very reluctant to initiate any antihypertensive therapy for this level of blood pressure

## 2015-12-09 NOTE — Assessment & Plan Note (Signed)
These don't sound to be related to cardiac problems besides maybe neurocardiogenic orthostatic symptoms. Would allow for mild permissive hypertension. Because of her multiple falls, she would not be a great candidate for PCI of any potential coronary disease.  Without MI, we decided that we would not want to pursue ischemic evaluation unless she is having symptomatic symptoms of angina Continue to work with PT. Falls precautions are warranted.

## 2015-12-09 NOTE — Assessment & Plan Note (Addendum)
Relatively asymptomatic. She has some chest wall type pain, no concerning anginal type symptoms. Would not recommend evaluating with a stress test as she would not want to proceed with any further invasive evaluation beyond that unless she is truly having symptoms  Continue risk factor modification

## 2015-12-09 NOTE — Assessment & Plan Note (Signed)
Reportedly diagnosed years ago. Unable to find confirmation. REVEAL loop recorder in place with plans to have been removed in 2016. We'll discuss with electrophysiology determine if, and how this would be done Also if the device could be interrogated again.

## 2015-12-12 DIAGNOSIS — N3010 Interstitial cystitis (chronic) without hematuria: Secondary | ICD-10-CM | POA: Insufficient documentation

## 2015-12-14 DIAGNOSIS — M65321 Trigger finger, right index finger: Secondary | ICD-10-CM | POA: Diagnosis not present

## 2015-12-14 DIAGNOSIS — M65312 Trigger thumb, left thumb: Secondary | ICD-10-CM | POA: Diagnosis not present

## 2015-12-15 ENCOUNTER — Encounter: Payer: Self-pay | Admitting: Urology

## 2015-12-15 ENCOUNTER — Ambulatory Visit (INDEPENDENT_AMBULATORY_CARE_PROVIDER_SITE_OTHER): Payer: Commercial Managed Care - HMO | Admitting: Urology

## 2015-12-15 VITALS — BP 169/148 | HR 94 | Ht 61.0 in | Wt 155.6 lb

## 2015-12-15 DIAGNOSIS — W19XXXA Unspecified fall, initial encounter: Secondary | ICD-10-CM

## 2015-12-15 DIAGNOSIS — R296 Repeated falls: Secondary | ICD-10-CM | POA: Diagnosis not present

## 2015-12-15 DIAGNOSIS — N390 Urinary tract infection, site not specified: Secondary | ICD-10-CM | POA: Diagnosis not present

## 2015-12-15 DIAGNOSIS — N3010 Interstitial cystitis (chronic) without hematuria: Secondary | ICD-10-CM

## 2015-12-15 LAB — URINALYSIS, COMPLETE
BILIRUBIN UA: NEGATIVE
Glucose, UA: NEGATIVE
KETONES UA: NEGATIVE
NITRITE UA: NEGATIVE
Protein, UA: NEGATIVE
SPEC GRAV UA: 1.025 (ref 1.005–1.030)
UUROB: 0.2 mg/dL (ref 0.2–1.0)
pH, UA: 5.5 (ref 5.0–7.5)

## 2015-12-15 LAB — MICROSCOPIC EXAMINATION

## 2015-12-15 MED ORDER — SODIUM BICARBONATE 8.4 % IV SOLN
11.0000 mL | Freq: Once | INTRAVENOUS | Status: AC
  Administered 2015-12-15: 11 mL

## 2015-12-15 MED ORDER — LIDOCAINE HCL 2 % EX GEL
1.0000 "application " | Freq: Once | CUTANEOUS | Status: AC
  Administered 2015-12-15: 1 via URETHRAL

## 2015-12-15 NOTE — Progress Notes (Signed)
Bladder Rescue Solution Instillation  Due to interstitial cystitis patient is present today for a Rescue Solution Treatment.  Patient was cleaned and prepped in a sterile fashion with betadine and lidocaine 2% jelly was instilled into the urethra.  A 14 FR catheter was inserted, urine return was noted 5ml, urine was yellow in color.  Instilled a solution consisting of 17ml of Sodium Bicarb, 2 ml Lidocaine and 1 ml of Heparin. The catheter was then removed. Patient tolerated well, no complications were noted.   Performed by: Lyndee Hensen CMA  Follow up/ Additional Notes: One week

## 2015-12-16 DIAGNOSIS — H66001 Acute suppurative otitis media without spontaneous rupture of ear drum, right ear: Secondary | ICD-10-CM | POA: Diagnosis not present

## 2015-12-17 LAB — CULTURE, URINE COMPREHENSIVE

## 2015-12-20 ENCOUNTER — Ambulatory Visit: Payer: Commercial Managed Care - HMO | Attending: Family Medicine

## 2015-12-20 DIAGNOSIS — M6281 Muscle weakness (generalized): Secondary | ICD-10-CM | POA: Diagnosis not present

## 2015-12-20 DIAGNOSIS — R2681 Unsteadiness on feet: Secondary | ICD-10-CM | POA: Insufficient documentation

## 2015-12-20 NOTE — Therapy (Signed)
[place] MAIN [facility] SERVICES [address] [place], [place], [address] Phone: [phone]   Fax:  [phone]  Physical Therapy Treatment  Patient Details  Name: [name] MRN: [id number] Date of Birth: 25-Aug-1929 Referring Provider: Sanda Klein  Encounter Date: 12/20/2015      PT End of Session - 12/20/15 1458    Visit Number 1   Number of Visits 9   Date for PT Re-Evaluation 01/17/16   Authorization Type 1/10   PT Start Time 1345   PT Stop Time 1450   PT Time Calculation (min) 65 min   Equipment Utilized During Treatment Gait belt   Activity Tolerance Patient tolerated treatment well   Behavior During Therapy [facility] for tasks assessed/performed      Past Medical History  Diagnosis Date  . GERD (gastroesophageal reflux disease)   . Vertigo   . Essential hypertension, benign   . Paroxysmal supraventricular tachycardia ([place])   . Pure hypercholesterolemia   . MS (multiple sclerosis) ([place])   . Fibromyalgia   . Diverticulosis   . Diabetes mellitus without complication ([place])   . Osteoarthritis   . Dyslipidemia   . SOB (shortness of breath)     SECONDARY TO REFLUX  . Chest pain     SECONDARY TO GERD  . Peripheral neuropathy (HCC)     MILD  . Cancer ([place])     breast  . Depression   . Chronic interstitial cystitis   . History of fractured rib   . Chronic right hip pain   . History of TIA (transient ischemic attack)   . Carpal tunnel syndrome   . Thyroid nodule   . Hearing loss of left ear   . Vitamin B 12 deficiency   . Protein malnutrition ([place])   . Frequent falls   . Anxiety and depression   . Irritable bowel   . Recurrent UTI   . Solitary pulmonary nodule on lung CT 12/04/2015    3 mm LLL lung nodule June 2016, March 2017  . Tachycardia, paroxysmal (HCC)     Reportedly Paroxysmal Supraventricular Tachycardia, but unable to find documentation confirming this    Past Surgical History  Procedure Laterality Date  . Total  abdominal hysterectomy w/ bilateral salpingoophorectomy    . Mastectomy Right     DX MASTITIS/NO CANCER.Marland KitchenSALINE IMPLANT  . Cholecystectomy    . Hemorrhoid surgery      WITH RECONSTRUCTION  . Bladder tacking      X 3  . Eye surgeries      WITH BUCKLE DETACHMENT OF THE RETINA  . Tonsillectomy    . Appendectomy    . Tympanoplasty    . Breast surgery    . Mastectomy    . Breast lumpectomy    . Colonoscopy with propofol N/A 01/21/2015    Procedure: COLONOSCOPY WITH PROPOFOL;  Surgeon: Josefine Class, MD;  Location: [facility] ENDOSCOPY;  Service: Endoscopy;  Laterality: N/A;  . Esophagogastroduodenoscopy N/A 01/21/2015    Procedure: ESOPHAGOGASTRODUODENOSCOPY (EGD);  Surgeon: Josefine Class, MD;  Location: [facility] ENDOSCOPY;  Service: Endoscopy;  Laterality: N/A;  . Cardiac catheterization  10/2001    Normal coronary arteries with the exception of 20% proximal D1    There were no vitals filed for this visit.      Subjective Assessment - 12/20/15 1404    Subjective pt reports she falls almost every day. She reports she started falling in her 57s when she was dx with MS. she  reports her falls have gotten worse recently in the past 5 years.    Patient is accompained by: Family member   Limitations Walking;House hold activities   Patient Stated Goals "to not fall as much, to decrease pain, to be less dizzy."   Pain Onset More than a month ago            [facility] PT Assessment - 12/20/15 0001    Assessment   Medical Diagnosis Frequent falls   Referring Provider Lada   Onset Date/Surgical Date 12/20/14   Prior Therapy yes   Precautions   Precautions Fall   Precaution Comments MS   Restrictions   Weight Bearing Restrictions No   Balance Screen   Has the patient fallen in the past 6 months Yes   How many times? many   Has the patient had a decrease in activity level because of a fear of falling?  Yes   Is the patient reluctant to leave their home because of a fear of falling?   Yes   [place] Private residence   Living Arrangements Spouse/significant other   Type of [place] One level   [place] - 4 wheels   Prior Function   Level of Independence Independent with household mobility with device   Vocation Retired   Editor, commissioning Impaired Detail   Semmes Weinstein Monofilament Scale Diminshed Light Touch   Standardized Balance Assessment   Standardized Balance Assessment Berg Balance Test;Timed Up and Go Test;Five Times Sit to Stand;10 meter walk test        POSTURE/OBSERVATION: No acute distress  PROM/AROM: WFL  STRENGTH:  Graded on a 0-5 scale Muscle Group Left Right  Shoulder flex 4 4  Shoulder Abd 4 4  Shoulder Ext    Shoulder IR/ER    Elbow 4 4  Wrist/hand  4 4  Hip Flex 4- 4-  Hip Abd 4- 4-  Hip Add 4- 4-  Hip Ext 4- 4-  Hip IR/ER    Knee Flex 4- 4-  Knee Ext 4- 4-  Ankle DF 4- 4-  Ankle PF 3 3   SENSATION: Impaired below the knee  SPECIAL TESTS:   FUNCTIONAL MOBILITY: CGA with device and inc time  BALANCE: Poor, static balance is poor. Pt has history of falling out of chairs.   GAIT: Wide BOS, using rollator, good heel/ toe transfer  OUTCOME MEASURES: TEST Outcome Interpretation  5 times sit<>stand 28s sec >60 yo, >15 sec indicates increased risk for falls  10 meter walk test         0.58m/s        m/s <1.0 m/s indicates increased risk for falls; limited community ambulator  Timed up and Go                 sec <14 sec indicates increased risk for falls  6 minute walk test                Feet 1000 feet is community Water quality scientist  <36/56 (100% risk for falls), 37-45 (80% risk for falls); 46-51 (>50% risk for falls); 52-55 (lower risk <25% of falls)                           PT Education - 12/20/15 1456    Education provided Yes   Education Details  plan of care, elements of balance,  exam findings   Person(s) Educated Patient   Methods Explanation   Comprehension Verbalized understanding             PT Long Term Goals - Dec 22, 2015 1507    PT LONG TERM GOAL #1   Title pt will improve berg balance score by 6pts to demo improved balance    Time 6   Period Weeks   Status New   PT LONG TERM GOAL #2   Title Patient will demonstrate improved safety by improving gait speed to 10 meters in < 20 sec.    Baseline 26 sec   Time 6   Period Weeks   Status New   PT LONG TERM GOAL #3   Title Patient will demonstrate improved mobility by performing 5x STS in < 20 sec.    Baseline 28s   Time 6   Period Weeks   Status New               Plan - 2015-12-22 1458    Clinical Impression Statement pt presents with extensive history of imbalance and falls secondary likely to mulitiple factors. pt has history of vision impairement due to retina issues, history of weakness and fatigue due to deconditioning and MS, history of reduced LE sensation due to neuropathy, joint pain in hands/feet due to arthritis, and extensive dizziness possibly due to inner ear issues. explained to pt today that some of her factors effecting her balance are modifiable, but some are not and we need to focus on those that are such has her strength, balance, choice of AD, shoewear, and vestibular rehab. pt does report s/s consistant with BPPV which should be further evaluated. pt would benefit from skilled PT services to address strength, balance, gait and mobility.    Rehab Potential Fair   Clinical Impairments Affecting Rehab Potential co-morbidities    PT Frequency 2x / week   PT Duration 6 weeks   PT Treatment/Interventions Moist Heat;Therapeutic exercise;Therapeutic activities;Gait training;Neuromuscular re-education;Balance training;Aquatic Therapy;Patient/family education;Manual techniques;Energy conservation;Vestibular;ADLs/Self Care Home Management;Canalith Repostioning;Cryotherapy;Secretary/administrator and Agree with Plan of Care Patient      Patient will benefit from skilled therapeutic intervention in order to improve the following deficits and impairments:  Abnormal gait, Decreased activity tolerance, Decreased balance, Decreased strength, Decreased mobility, Difficulty walking, Dizziness, Pain, Decreased safety awareness, Decreased endurance, Decreased range of motion  Visit Diagnosis: Unsteadiness on feet - Plan: PT plan of care cert/re-cert  Muscle weakness (generalized) - Plan: PT plan of care cert/re-cert       G-Codes - Dec 22, 2015 1507/11/07    Functional Assessment Tool Used [id number]   Functional Limitation Mobility: Walking and moving around   Mobility: Walking and Moving Around Current Status [phone]) At least 60 percent but less than 80 percent impaired, limited or restricted   Mobility: Walking and Moving Around Goal Status [phone]) At least 20 percent but less than 40 percent impaired, limited or restricted      Problem List Patient Active Problem List   Diagnosis Date Noted  . IC (interstitial cystitis) 12/12/2015  . Solitary pulmonary nodule on lung CT 12/04/2015  . Pain, joint, hand 11/11/2015  . Coronary artery calcification seen on CAT scan 11/11/2015  . Multiple sclerosis ([place]) 11/11/2015  . Dizziness 11/11/2015  . Interstitial cystitis 10/24/2015  . Diabetic neuropathy with neurologic complication ([place]) [id number]  . Chronic pain of multiple joints 09/02/2015  . Right lower quadrant abdominal pain 08/02/2015  .  Microscopic hematuria 07/12/2015  . Recurrent UTI 04/09/2015  . Dysuria 04/01/2015  . Carpal tunnel syndrome 01/12/2014  . DD (diverticular disease) 01/12/2014  . Carcinoma in situ, breast, ductal 01/12/2014  . H/O surgical procedure 01/12/2014  . H/O right mastectomy 01/12/2014  . Hypercholesteremia 01/12/2014  . DS (disseminated sclerosis) ([place]) 01/12/2014  . Arthritis, degenerative 01/12/2014  . Thyroid nodule  01/12/2014  . Cyanocobalamine deficiency (non anemic) 01/12/2014  . DM (diabetes mellitus) type II controlled, neurological manifestation ([place]) 06/26/2010  . Depression, major, recurrent, in partial remission ([place]) 06/26/2010  . Frequent falls 06/26/2010  . Stricture and stenosis of esophagus 06/26/2010  . Gastro-esophageal reflux disease without esophagitis 06/26/2010  . Fibromyalgia 06/26/2010  . Chronic interstitial cystitis 06/26/2010  . H/O malignant neoplasm of breast 06/26/2010  . Hypertension goal BP (blood pressure) < 140/90 06/01/2009  . H/O paroxysmal supraventricular tachycardia 06/01/2009  . Paroxysmal supraventricular tachycardia ([place]) 06/01/2009  . Irritable bowel syndrome with constipation and diarrhea 06/16/2008  . Diarrhea, functional 06/16/2008   Gorden Harms. Lorriann Hansmann, PT, DPT [phone]  Dainelle Hun 12/20/2015, 3:14 PM  [place] MAIN [facility] SERVICES [address] [place], [place], [address] Phone: [phone]   Fax:  [phone]  Name: [name] MRN: [id number] Date of Birth: [birth year]

## 2015-12-20 NOTE — Progress Notes (Signed)
9:06 AM   [name] 04-12-1930 [id number]  Referring provider: Arnetha Courser, MD [address] [place], [place] [address]  Chief Complaint  Patient presents with  . Cystitis    Rescue Solution    HPI: Patient is an 82 year old white female with a history of IC who presents today for rescue installation.     Patient has been experiencing falls recently. She has been evaluated by cardiology and they feel that this is not of a cardiac nature.   She is currently working with physical therapist.    Today, she states she is having frequency of urination, urgency, burning, incontinence and nocturia.  She denies fever, chills, nausea or vomiting. Her UA today is positive for 0-2 RBC's per high-power field.    She recently had a friend pass away and is upset that she wasn't able to attend the funeral.   PMH: Past Medical History  Diagnosis Date  . GERD (gastroesophageal reflux disease)   . Vertigo   . Essential hypertension, benign   . Paroxysmal supraventricular tachycardia ([place])   . Pure hypercholesterolemia   . MS (multiple sclerosis) ([place])   . Fibromyalgia   . Diverticulosis   . Diabetes mellitus without complication ([place])   . Osteoarthritis   . Dyslipidemia   . SOB (shortness of breath)     SECONDARY TO REFLUX  . Chest pain     SECONDARY TO GERD  . Peripheral neuropathy (HCC)     MILD  . Cancer ([place])     breast  . Depression   . Chronic interstitial cystitis   . History of fractured rib   . Chronic right hip pain   . History of TIA (transient ischemic attack)   . Carpal tunnel syndrome   . Thyroid nodule   . Hearing loss of left ear   . Vitamin B 12 deficiency   . Protein malnutrition ([place])   . Frequent falls   . Anxiety and depression   . Irritable bowel   . Recurrent UTI   . Solitary pulmonary nodule on lung CT 12/04/2015    3 mm LLL lung nodule June 2016, March 2017  . Tachycardia, paroxysmal (HCC)     Reportedly Paroxysmal  Supraventricular Tachycardia, but unable to find documentation confirming this    Surgical History: Past Surgical History  Procedure Laterality Date  . Total abdominal hysterectomy w/ bilateral salpingoophorectomy    . Mastectomy Right     DX MASTITIS/NO CANCER.Marland KitchenSALINE IMPLANT  . Cholecystectomy    . Hemorrhoid surgery      WITH RECONSTRUCTION  . Bladder tacking      X 3  . Eye surgeries      WITH BUCKLE DETACHMENT OF THE RETINA  . Tonsillectomy    . Appendectomy    . Tympanoplasty    . Breast surgery    . Mastectomy    . Breast lumpectomy    . Colonoscopy with propofol N/A 01/21/2015    Procedure: COLONOSCOPY WITH PROPOFOL;  Surgeon: Josefine Class, MD;  [facility];  Service: Endoscopy;  Laterality: N/A;  . Esophagogastroduodenoscopy N/A 01/21/2015    Procedure: ESOPHAGOGASTRODUODENOSCOPY (EGD);  Surgeon: Josefine Class, MD;  [facility]OSCOPY;  Service: Endoscopy;  Laterality: N/A;  . Cardiac catheterization  10/2001    Normal coronary arteries with the exception of 20% proximal D1    Home Medications:    Medication List       This list is  accurate as of: 12/15/15 11:59 PM.  Always use your most recent med list.               aspirin EC 81 MG tablet  Take 1 tablet (81 mg total) by mouth daily.     b complex vitamins tablet  Take 1 tablet by mouth daily.     CVS VITAMIN B12 2000 MCG tablet  Generic drug:  cyanocobalamin  Take 2,000 mcg by mouth daily.     ferrous sulfate 325 (65 FE) MG tablet  Take 1 tablet (325 mg total) by mouth daily with breakfast.     fluticasone 50 MCG/ACT nasal spray  Commonly known as:  FLONASE  SPRAY 2 SPRAYS EACH INTO BOTH NOSTRILS ONCE DAILY EACH NIGHT.     GAS-X EXTRA STRENGTH 125 MG Caps  Generic drug:  Simethicone  Take 1 capsule by mouth as needed. Reported on 11/24/2015     GENTEAL OP  Apply 1 drop to eye. Each eye as needed     hydrocortisone 25 MG suppository  Commonly known as:  ANUSOL-HC    Place 1 suppository (25 mg total) rectally 2 (two) times daily.     loperamide 2 MG tablet  Commonly known as:  IMODIUM A-D  Take 2 mg by mouth 4 (four) times daily as needed for diarrhea or loose stools.     meclizine 25 MG tablet  Commonly known as:  ANTIVERT  TAKE ONE TO TWO TABLETS BY MOUTH EVERY 8 HOURS AS NEEDED     metFORMIN 500 MG tablet  Commonly known as:  GLUCOPHAGE  Take 1 tablet (500 mg total) by mouth daily with breakfast.     omeprazole 40 MG capsule  Commonly known as:  PRILOSEC  Take 40 mg by mouth daily.     ondansetron 4 MG tablet  Commonly known as:  ZOFRAN  Take 4 mg by mouth every 8 (eight) hours as needed for nausea or vomiting. Reported on 11/30/2015     polyethylene glycol packet  Commonly known as:  MIRALAX / GLYCOLAX  Take 17 g by mouth daily as needed. Reported on 11/15/2015     PROBIOTIC COLON SUPPORT Caps  Take 1 capsule by mouth daily.     rosuvastatin 5 MG tablet  Commonly known as:  CRESTOR  Take 2.5 mg by mouth daily at 6 PM.     sertraline 50 MG tablet  Commonly known as:  ZOLOFT  Take 1 tablet (50 mg total) by mouth daily.     URIBEL 118 MG Caps  One tablet four times daily as needed with 8 oz of water     VITAMIN D-1000 MAX ST 1000 units tablet  Generic drug:  Cholecalciferol  Take 1,000 Units by mouth daily. Reported on 12/15/2015     Vitamin D3 50000 units Caps  Take 5,000 Int'l Units/day by mouth daily.     Vitamins A & D 5000-400 units Caps  Take 1 capsule by mouth daily. Reported on 12/06/2015        Allergies:  Allergies  Allergen Reactions  . Biaxin [Clarithromycin] Other (See Comments) and Diarrhea  . Copaxone [Glatiramer Acetate]   . Decadron [Dexamethasone] Nausea And Vomiting and Other (See Comments)    Other reaction(s): Muscle Pain Reaction:  Abdominal pain  . Dexamethasone Sodium Phosphate Other (See Comments)  . Diltiazem Hcl Other (See Comments)    Reaction:  Unknown   . Diltiazem Hcl Other (See  Comments)  . Flagyl [Metronidazole] Nausea Only  and Diarrhea    Other reaction(s): Vomiting  . Gabapentin Other (See Comments)    "burning all over" Reaction:  Unknown   . Iohexol Swelling and Other (See Comments)     Desc: tongue swelling with "IVP dye" sccording to nurses notes with a lumbar myelo-12/09- asm, Onset Date: [id number]  Pts tongue swells.  Clementeen Hoof [Iodinated Diagnostic Agents] Swelling and Other (See Comments)    Passed out  Pts tongue swells.   . Levothyroxine Nausea Only  . Sulfa Antibiotics Other (See Comments)    Reaction:  Unknown   . Sulfonamide Derivatives Other (See Comments)    Reaction:  Unknown   . Synthroid [Levothyroxine Sodium]   . Copaxone  [Glatiramer] Rash  . Interferon Beta-1a Other (See Comments) and Rash    Reaction:  Unknown     Family History: Family History  Problem Relation Age of Onset  . Arthritis Sister   . Aneurysm Brother   . Heart disease Daughter   . ALS Mother   . Colon cancer    . Kidney disease Sister     Social History:  reports that she has never smoked. She has never used smokeless tobacco. She reports that she does not drink alcohol or use illicit drugs.  ROS: UROLOGY Frequent Urination?: Yes Hard to postpone urination?: Yes Burning/pain with urination?: Yes Get up at night to urinate?: Yes Leakage of urine?: Yes Urine stream starts and stops?: No Trouble starting stream?: No Do you have to strain to urinate?: No Blood in urine?: No Urinary tract infection?: No Sexually transmitted disease?: No Injury to kidneys or bladder?: No Painful intercourse?: No Weak stream?: No Currently pregnant?: No Vaginal bleeding?: No Last menstrual period?: n  Gastrointestinal Nausea?: No Vomiting?: No Indigestion/heartburn?: Yes Diarrhea?: Yes Constipation?: Yes  Constitutional Fever: No Night sweats?: Yes Weight loss?: No Fatigue?: Yes  Skin Skin rash/lesions?: No Itching?: No  Eyes Blurred vision?:  Yes Double vision?: No  Ears/Nose/Throat Sore throat?: No Sinus problems?: Yes  Hematologic/Lymphatic Swollen glands?: No Easy bruising?: No  Cardiovascular Leg swelling?: No Chest pain?: No  Respiratory Cough?: No Shortness of breath?: No  Endocrine Excessive thirst?: No  Musculoskeletal Back pain?: Yes Joint pain?: Yes  Neurological Headaches?: Yes Dizziness?: Yes  Psychologic Depression?: Yes Anxiety?: Yes  Physical Exam: Blood pressure 145/83, pulse 83, height 5' 3"  (1.6 m), weight 157 lb 12.8 oz (71.578 kg). Constitutional: Well nourished. Alert and oriented, No acute distress. HEENT: [place] AT, moist mucus membranes. Trachea midline, no masses. Cardiovascular: No clubbing, cyanosis, or edema. Respiratory: Normal respiratory effort, no increased work of breathing. GI: Abdomen is soft, non tender, non distended, no abdominal masses. GU: No CVA tenderness.  No bladder fullness or masses.   Skin: No rashes, bruises or suspicious lesions. Lymph: No cervical or inguinal adenopathy. Neurologic: Grossly intact, no focal deficits, moving all 4 extremities. Psychiatric: Normal mood and affect.  Laboratory Data: Lab Results  Component Value Date   WBC 4.3 10/31/2015   HGB 13.6 10/31/2015   HCT 40.9 10/31/2015   MCV 91.0 10/31/2015   PLT 190 10/31/2015   Lab Results  Component Value Date   CREATININE 0.94 10/31/2015   Lab Results  Component Value Date   HGBA1C 7.0 09/02/2015    Urinalysis: Results for orders placed or performed in visit on 12/15/15  CULTURE, URINE COMPREHENSIVE  Result Value Ref Range   Urine Culture, Comprehensive Final report    Result 1 Comment   Microscopic Examination  Result Value  Ref Range   WBC, UA 6-10 (A) 0 -  5 /hpf   RBC, UA 0-2 0 -  2 /hpf   Epithelial Cells (non renal) 0-10 0 - 10 /hpf   Bacteria, UA Few (A) None seen/Few  Urinalysis, Complete  Result Value Ref Range   Specific Gravity, UA 1.025 1.005 - 1.030   pH,  UA 5.5 5.0 - 7.5   Color, UA Yellow Yellow   Appearance Ur Clear Clear   Leukocytes, UA 1+ (A) Negative   Protein, UA Negative Negative/Trace   Glucose, UA Negative Negative   Ketones, UA Negative Negative   RBC, UA 1+ (A) Negative   Bilirubin, UA Negative Negative   Urobilinogen, Ur 0.2 0.2 - 1.0 mg/dL   Nitrite, UA Negative Negative   Microscopic Examination See below:     Assessment & Plan:    1. Interstitial cystitis:  Patient's rescue installation is performed today.  She will return next week for her next treatment.  0-2 RBC's/hpf were noted on today's UA.   She states that the instillations are helpful and she would like to continue them.  2. Recurrent UTI's:   UA at clear today. We will continue to monitor. She will return next week and a urinalysis will be performed at that time.  3. Falling:   Patient has been evaluated by cardiology and they do not feel the falls other results of a cardiac issue.  We will need to assist the patient with undressing and getting on exam tables at this time.   Return in about 1 week (around 12/22/2015) for Rescue solution.  Zara Council, [facility] [address] [place], [place] [address] [phone]

## 2015-12-22 ENCOUNTER — Ambulatory Visit (INDEPENDENT_AMBULATORY_CARE_PROVIDER_SITE_OTHER): Payer: Commercial Managed Care - HMO | Admitting: Urology

## 2015-12-22 ENCOUNTER — Ambulatory Visit: Payer: Commercial Managed Care - HMO

## 2015-12-22 ENCOUNTER — Encounter: Payer: Self-pay | Admitting: Urology

## 2015-12-22 VITALS — BP 140/61 | HR 69

## 2015-12-22 VITALS — BP 152/77 | HR 69 | Ht 60.0 in | Wt 156.8 lb

## 2015-12-22 DIAGNOSIS — R2681 Unsteadiness on feet: Secondary | ICD-10-CM

## 2015-12-22 DIAGNOSIS — N390 Urinary tract infection, site not specified: Secondary | ICD-10-CM

## 2015-12-22 DIAGNOSIS — N3010 Interstitial cystitis (chronic) without hematuria: Secondary | ICD-10-CM

## 2015-12-22 DIAGNOSIS — M6281 Muscle weakness (generalized): Secondary | ICD-10-CM

## 2015-12-22 LAB — URINALYSIS, COMPLETE
Bilirubin, UA: NEGATIVE
Glucose, UA: NEGATIVE
NITRITE UA: NEGATIVE
PH UA: 5 (ref 5.0–7.5)
Protein, UA: NEGATIVE
Specific Gravity, UA: >1.03 — ABNORMAL HIGH (ref 1.005–1.030)
Urobilinogen, Ur: 0.2 mg/dL (ref 0.2–1.0)

## 2015-12-22 LAB — MICROSCOPIC EXAMINATION

## 2015-12-22 MED ORDER — AMOXICILLIN-POT CLAVULANATE 875-125 MG PO TABS: 1.0000 | ORAL_TABLET | Freq: Two times a day (BID) | ORAL | Status: DC

## 2015-12-22 NOTE — Progress Notes (Signed)
7:29 PM   [name] 27-Mar-1930 [id number]  Referring provider: Arnetha Courser, MD [address] [place], [place] [address]  Chief Complaint  Patient presents with  . Cystitis    Rescue Solution    HPI: Patient is an 82 year old white female with a history of IC who presents today for rescue installation.     Patient has been experiencing falls recently. She has been evaluated by cardiology and they feel that this is not of a cardiac nature.   She is currently working with physical therapist.    Today, she states she is having dysuria and nocturia.  She denies fever, chills, nausea or vomiting. Her UA today is positive for 3-10 RBC's per high-power field and >30 WBC's/hpf.    Patient had a CT abdomen and pelvis wo contrast on 08/03/2015 which noted a stable hyperdense cyst in the left kidney.  She had a cystoscopy in 2015 which revealed no malignancies.     PMH: Past Medical History  Diagnosis Date  . GERD (gastroesophageal reflux disease)   . Vertigo   . Essential hypertension, benign   . Paroxysmal supraventricular tachycardia ([place])   . Pure hypercholesterolemia   . MS (multiple sclerosis) ([place])   . Fibromyalgia   . Diverticulosis   . Diabetes mellitus without complication ([place])   . Osteoarthritis   . Dyslipidemia   . SOB (shortness of breath)     SECONDARY TO REFLUX  . Chest pain     SECONDARY TO GERD  . Peripheral neuropathy (HCC)     MILD  . Cancer ([place])     breast  . Depression   . Chronic interstitial cystitis   . History of fractured rib   . Chronic right hip pain   . History of TIA (transient ischemic attack)   . Carpal tunnel syndrome   . Thyroid nodule   . Hearing loss of left ear   . Vitamin B 12 deficiency   . Protein malnutrition ([place])   . Frequent falls   . Anxiety and depression   . Irritable bowel   . Recurrent UTI   . Solitary pulmonary nodule on lung CT 12/04/2015    3 mm LLL lung nodule June 2016, March 2017  .  Tachycardia, paroxysmal (HCC)     Reportedly Paroxysmal Supraventricular Tachycardia, but unable to find documentation confirming this    Surgical History: Past Surgical History  Procedure Laterality Date  . Total abdominal hysterectomy w/ bilateral salpingoophorectomy    . Mastectomy Right     DX MASTITIS/NO CANCER.Marland KitchenSALINE IMPLANT  . Cholecystectomy    . Hemorrhoid surgery      WITH RECONSTRUCTION  . Bladder tacking      X 3  . Eye surgeries      WITH BUCKLE DETACHMENT OF THE RETINA  . Tonsillectomy    . Appendectomy    . Tympanoplasty    . Breast surgery    . Mastectomy    . Breast lumpectomy    . Colonoscopy with propofol N/A 01/21/2015    Procedure: COLONOSCOPY WITH PROPOFOL;  Surgeon: Josefine Class, MD;  Location: [facility] ENDOSCOPY;  Service: Endoscopy;  Laterality: N/A;  . Esophagogastroduodenoscopy N/A 01/21/2015    Procedure: ESOPHAGOGASTRODUODENOSCOPY (EGD);  Surgeon: Josefine Class, MD;  Location: [facility] ENDOSCOPY;  Service: Endoscopy;  Laterality: N/A;  . Cardiac catheterization  10/2001    Normal coronary arteries with the exception of 20% proximal D1    Home Medications:  Medication List       This list is accurate as of: 12/22/15 11:59 PM.  Always use your most recent med list.               amoxicillin-clavulanate 875-125 MG tablet  Commonly known as:  AUGMENTIN  Take 1 tablet by mouth every 12 (twelve) hours.     aspirin EC 81 MG tablet  Take 1 tablet (81 mg total) by mouth daily.     b complex vitamins tablet  Take 1 tablet by mouth daily.     CVS VITAMIN B12 2000 MCG tablet  Generic drug:  cyanocobalamin  Take 2,000 mcg by mouth daily.     ferrous sulfate 325 (65 FE) MG tablet  Take 1 tablet (325 mg total) by mouth daily with breakfast.     fluticasone 50 MCG/ACT nasal spray  Commonly known as:  FLONASE  SPRAY 2 SPRAYS EACH INTO BOTH NOSTRILS ONCE DAILY EACH NIGHT.     GAS-X EXTRA STRENGTH 125 MG Caps  Generic drug:  Simethicone    Take 1 capsule by mouth as needed. Reported on 11/24/2015     GENTEAL OP  Apply 1 drop to eye. Each eye as needed     hydrocortisone 25 MG suppository  Commonly known as:  ANUSOL-HC  Place 1 suppository (25 mg total) rectally 2 (two) times daily.     loperamide 2 MG tablet  Commonly known as:  IMODIUM A-D  Take 2 mg by mouth 4 (four) times daily as needed for diarrhea or loose stools.     meclizine 25 MG tablet  Commonly known as:  ANTIVERT  TAKE ONE TO TWO TABLETS BY MOUTH EVERY 8 HOURS AS NEEDED     metFORMIN 500 MG tablet  Commonly known as:  GLUCOPHAGE  Take 1 tablet (500 mg total) by mouth daily with breakfast.     neomycin-polymyxin-hydrocortisone 3.5-10000-1 otic suspension  Commonly known as:  CORTISPORIN     omeprazole 40 MG capsule  Commonly known as:  PRILOSEC  Take 40 mg by mouth daily.     ondansetron 4 MG tablet  Commonly known as:  ZOFRAN  Take 4 mg by mouth every 8 (eight) hours as needed for nausea or vomiting. Reported on 12/22/2015     polyethylene glycol packet  Commonly known as:  MIRALAX / GLYCOLAX  Take 17 g by mouth daily as needed. Reported on 11/15/2015     PROBIOTIC COLON SUPPORT Caps  Take 1 capsule by mouth daily.     rosuvastatin 5 MG tablet  Commonly known as:  CRESTOR  Take 2.5 mg by mouth daily at 6 PM.     sertraline 50 MG tablet  Commonly known as:  ZOLOFT  Take 1 tablet (50 mg total) by mouth daily.     URIBEL 118 MG Caps  One tablet four times daily as needed with 8 oz of water     VITAMIN D-1000 MAX ST 1000 units tablet  Generic drug:  Cholecalciferol  Take 1,000 Units by mouth daily. Reported on 12/15/2015     Vitamin D3 50000 units Caps  Take 5,000 Int'l Units/day by mouth daily.     Vitamins A & D 5000-400 units Caps  Take 1 capsule by mouth daily. Reported on 12/06/2015        Allergies:  Allergies  Allergen Reactions  . Biaxin [Clarithromycin] Other (See Comments) and Diarrhea  . Copaxone [Glatiramer Acetate]    . Decadron [Dexamethasone] Nausea And Vomiting and   Other (See Comments)    Other reaction(s): Muscle Pain Reaction:  Abdominal pain  . Dexamethasone Sodium Phosphate Other (See Comments)  . Diltiazem Hcl Other (See Comments)    Reaction:  Unknown   . Diltiazem Hcl Other (See Comments)  . Flagyl [Metronidazole] Nausea Only and Diarrhea    Other reaction(s): Vomiting  . Gabapentin Other (See Comments)    "burning all over" Reaction:  Unknown   . Iohexol Swelling and Other (See Comments)     Desc: tongue swelling with "IVP dye" sccording to nurses notes with a lumbar myelo-12/09- asm, Onset Date: [id number]  Pts tongue swells.  Clementeen Hoof [Iodinated Diagnostic Agents] Swelling and Other (See Comments)    Passed out  Pts tongue swells.   . Levothyroxine Nausea Only  . Sulfa Antibiotics Other (See Comments)    Reaction:  Unknown   . Sulfonamide Derivatives Other (See Comments)    Reaction:  Unknown   . Synthroid [Levothyroxine Sodium]   . Copaxone  [Glatiramer] Rash  . Interferon Beta-1a Other (See Comments) and Rash    Reaction:  Unknown     Family History: Family History  Problem Relation Age of Onset  . Arthritis Sister   . Aneurysm Brother   . Heart disease Daughter   . ALS Mother   . Colon cancer    . Kidney disease Sister     Social History:  reports that she has never smoked. She has never used smokeless tobacco. She reports that she does not drink alcohol or use illicit drugs.  ROS: UROLOGY Frequent Urination?: No Hard to postpone urination?: No Burning/pain with urination?: Yes Get up at night to urinate?: Yes Leakage of urine?: No Urine stream starts and stops?: No Trouble starting stream?: No Do you have to strain to urinate?: No Blood in urine?: No Urinary tract infection?: No Sexually transmitted disease?: No Injury to kidneys or bladder?: No Painful intercourse?: No Weak stream?: No Currently pregnant?: No Vaginal bleeding?: No Last menstrual  period?: n  Gastrointestinal Nausea?: No Vomiting?: No Indigestion/heartburn?: Yes Diarrhea?: Yes Constipation?: Yes  Constitutional Fever: No Night sweats?: Yes Weight loss?: No Fatigue?: Yes  Skin Skin rash/lesions?: No Itching?: No  Eyes Blurred vision?: Yes Double vision?: No  Ears/Nose/Throat Sore throat?: No Sinus problems?: Yes  Hematologic/Lymphatic Swollen glands?: No Easy bruising?: No  Cardiovascular Leg swelling?: No Chest pain?: No  Respiratory Cough?: No Shortness of breath?: No  Endocrine Excessive thirst?: No  Musculoskeletal Back pain?: Yes Joint pain?: Yes  Neurological Headaches?: Yes Dizziness?: Yes  Psychologic Depression?: Yes Anxiety?: Yes  Physical Exam: Blood pressure 152/77, pulse 69, height 5' (1.524 m), weight 156 lb 12.8 oz (71.124 kg). Constitutional: Well nourished. Alert and oriented, No acute distress. HEENT: [place] AT, moist mucus membranes. Trachea midline, no masses. Cardiovascular: No clubbing, cyanosis, or edema. Respiratory: Normal respiratory effort, no increased work of breathing. GI: Abdomen is soft, non tender, non distended, no abdominal masses. GU: No CVA tenderness.  No bladder fullness or masses.   Skin: No rashes, bruises or suspicious lesions. Lymph: No cervical or inguinal adenopathy. Neurologic: Grossly intact, no focal deficits, moving all 4 extremities. Psychiatric: Normal mood and affect.  Laboratory Data: Lab Results  Component Value Date   WBC 4.3 10/31/2015   HGB 13.6 10/31/2015   HCT 40.9 10/31/2015   MCV 91.0 10/31/2015   PLT 190 10/31/2015   Lab Results  Component Value Date   CREATININE 0.94 10/31/2015   Lab Results  Component Value Date  HGBA1C 7.0 09/02/2015    Urinalysis: Results for orders placed or performed in visit on 12/22/15  Microscopic Examination  Result Value Ref Range   WBC, UA >30W 0 -  5 /hpf   RBC, UA 3-10 (A) 0 -  2 /hpf   Epithelial Cells (non renal)  >10E 0 - 10 /hpf   Renal Epithel, UA 0-10 (A) None seen /hpf   Bacteria, UA Few None seen/Few  Urinalysis, Complete  Result Value Ref Range   Specific Gravity, UA >1.030 (H) 1.005 - 1.030   pH, UA 5.0 5.0 - 7.5   Color, UA Yellow Yellow   Appearance Ur Cloudy (A) Clear   Leukocytes, UA 2+ (A) Negative   Protein, UA Negative Negative/Trace   Glucose, UA Negative Negative   Ketones, UA Trace (A) Negative   RBC, UA Trace (A) Negative   Bilirubin, UA Negative Negative   Urobilinogen, Ur 0.2 0.2 - 1.0 mg/dL   Nitrite, UA Negative Negative   Microscopic Examination See below:    Assessment & Plan:    1. Interstitial cystitis:  Patient's rescue installation is not performed today.  She will return next week for her next treatment.  3-10 RBC's/hpf and >30 WBC's/hpf were noted on today's UA.   Urine sent for culture.  She is empirically started on Augmentin 875/125.    2. Recurrent UTI's:   UA is suspicious for infection.   We will send for culture.  She will return next week and a urinalysis will be performed at that time.  3. Falling:   Patient has been evaluated by cardiology and they do not feel the falls other results of a cardiac issue.  We will need to assist the patient with undressing and getting on exam tables at this time.   Return in about 1 week (around 12/29/2015) for rescue solution.   , PA-C  [facility] [address], [address] [place], [place] [address] [phone]   

## 2015-12-22 NOTE — Therapy (Signed)
[place] MAIN [facility] SERVICES [address] [place], [place], [address] Phone: [phone]   Fax:  [phone]  Physical Therapy Treatment  Patient Details  Name: [name] MRN: [id number] Date of Birth: 1930/04/06 Referring Provider: Sanda Klein  Encounter Date: 12/22/2015      PT End of Session - 12/22/15 1432    Visit Number 2   Number of Visits 9   Date for PT Re-Evaluation 01/17/16   Authorization Type 2/10   PT Start Time 1346   PT Stop Time 1430   PT Time Calculation (min) 44 min   Equipment Utilized During Treatment Gait belt   Activity Tolerance Patient tolerated treatment well   Behavior During Therapy [facility] for tasks assessed/performed      Past Medical History  Diagnosis Date  . GERD (gastroesophageal reflux disease)   . Vertigo   . Essential hypertension, benign   . Paroxysmal supraventricular tachycardia ([place])   . Pure hypercholesterolemia   . MS (multiple sclerosis) ([place])   . Fibromyalgia   . Diverticulosis   . Diabetes mellitus without complication ([place])   . Osteoarthritis   . Dyslipidemia   . SOB (shortness of breath)     SECONDARY TO REFLUX  . Chest pain     SECONDARY TO GERD  . Peripheral neuropathy (HCC)     MILD  . Cancer ([place])     breast  . Depression   . Chronic interstitial cystitis   . History of fractured rib   . Chronic right hip pain   . History of TIA (transient ischemic attack)   . Carpal tunnel syndrome   . Thyroid nodule   . Hearing loss of left ear   . Vitamin B 12 deficiency   . Protein malnutrition ([place])   . Frequent falls   . Anxiety and depression   . Irritable bowel   . Recurrent UTI   . Solitary pulmonary nodule on lung CT 12/04/2015    3 mm LLL lung nodule June 2016, March 2017  . Tachycardia, paroxysmal (HCC)     Reportedly Paroxysmal Supraventricular Tachycardia, but unable to find documentation confirming this    Past Surgical History  Procedure Laterality Date  . Total  abdominal hysterectomy w/ bilateral salpingoophorectomy    . Mastectomy Right     DX MASTITIS/NO CANCER.Marland KitchenSALINE IMPLANT  . Cholecystectomy    . Hemorrhoid surgery      WITH RECONSTRUCTION  . Bladder tacking      X 3  . Eye surgeries      WITH BUCKLE DETACHMENT OF THE RETINA  . Tonsillectomy    . Appendectomy    . Tympanoplasty    . Breast surgery    . Mastectomy    . Breast lumpectomy    . Colonoscopy with propofol N/A 01/21/2015    Procedure: COLONOSCOPY WITH PROPOFOL;  Surgeon: Josefine Class, MD;  Location[facility] ENDOSCOPY;  Service: Endoscopy;  Laterality: N/A;  . Esophagogastroduodenoscopy N/A 01/21/2015    Procedure: ESOPHAGOGASTRODUODENOSCOPY (EGD);  Surgeon: Josefine Class, MD;  Location: [facility] ENDOSCOPY;  Service: Endoscopy;  Laterality: N/A;  . Cardiac catheterization  10/2001    Normal coronary arteries with the exception of 20% proximal D1    Filed Vitals:   12/22/15 1350  BP: 140/61  Pulse: 69  SpO2: 99%        Subjective Assessment - 12/22/15 1349    Subjective Pt reports she has chronic generalized body pain all the time. Unable to  rate at this time but not acute pain. She complains of chronic dizziness for many years. She states she has been to "dizzy" physical therapy and this made her symptoms worse. No specific questions or concerns currently. Pt denies new falls since last therapy visit.    Patient is accompained by: Family member   Limitations Walking;House hold activities   Patient Stated Goals "to not fall as much, to decrease pain, to be less dizzy."   Currently in Pain? Yes   Pain Location --  Generalized chronic body pain, unable to rate   Pain Type Chronic pain      Treatment  Neuromuscular Re-education  Briefly obtained history from patient for her symptomatic dizziness; Pt appears to have a L horizontal mid range nystagmus with L gaze during finger follow. She has bouts of vertigo that occur when she bends over primarily or when she  first sits up in bed. They last for an entire day and improve with meclizine. Pt reports she will take up to 4 meclizine within a short time frame; Marye Round performed with patient and is negative for nystagmus bilaterally. Pt reports 7/10 vertigo on the R side side and 8/10 on L side however appears in no acute distress while in position. She reports worsening of symptoms when returning to sitting. Once upright pt reports nausea requiring 5 minutes of seated rest break and wet wash cloth. Nausea persists for rest of therapy session; Pt reports she has a follow-up appointment with ENT to discuss the tubes in her L ear. She reports a history of vestibular therapy in [place] which "made me worse." Do not believe that pt is a candidate for vestibular rehab at this time;  Sit to stand without UE support x 10 for both strengthening and balance; Toe taps to 6" step without UE support; Heel/toe rocking x 10 each direction without UE support; Single leg balance removing hands from bars intermittently for 2-4 second bouts x 30 seconds total on each LE; Toe taps to 6" step without UE support standing on Airex; Extensive education with patient regarding HEP. Written program provided with instructions about how to complete safely at home.                             PT Education - 12/22/15 1354    Education provided Yes   Education Details HEP furnished   [employer]) Educated Patient   Methods Explanation;Demonstration;Tactile cues;Verbal cues;Handout   Comprehension Verbalized understanding;Returned demonstration;Verbal cues required             PT Long Term Goals - 12/20/15 1507    PT LONG TERM GOAL #1   Title pt will improve berg balance score by 6pts to demo improved balance    Time 6   Period Weeks   Status New   PT LONG TERM GOAL #2   Title Patient will demonstrate improved safety by improving gait speed to 10 meters in < 20 sec.    Baseline 26 sec   Time 6    Period Weeks   Status New   PT LONG TERM GOAL #3   Title Patient will demonstrate improved mobility by performing 5x STS in < 20 sec.    Baseline 28s   Time 6   Period Weeks   Status New               Plan - 12/22/15 1432    Clinical Impression Statement Pt  demonstrates considerable balance deficits. Marye Round testing was negative bilaterally for posterior canal BPPV however pt reports subjective vertigo as well as worsening of symptoms after sitting up. She becomes nauseated which lasts the rest of the therapy session. She does have some midrange L horizontal nystagmus with L gaze but unclear of significance. Her dizziness is likely multifactoral and could have a vestibular component however pt reports prior history of vestibular therapy with worsening of symptoms. Pt furnished with HEP for balance and LE strengthening and extensive education performed with patient regarding how to safely complete at home. Pt encouraged to follow-up as scheduled.    Rehab Potential Fair   Clinical Impairments Affecting Rehab Potential co-morbidities    PT Frequency 2x / week   PT Duration 6 weeks   PT Treatment/Interventions Moist Heat;Therapeutic exercise;Therapeutic activities;Gait training;Neuromuscular re-education;Balance training;Aquatic Therapy;Patient/family education;Manual techniques;Energy conservation;Vestibular;ADLs/Self Care Home Management;Canalith Repostioning;Cryotherapy;Electrical Stimulation   PT Next Visit Plan Continue balance and LE strengthening   PT Home Exercise Plan Sit to stand, heel/toe rocking, single leg balance   Consulted and Agree with Plan of Care Patient      Patient will benefit from skilled therapeutic intervention in order to improve the following deficits and impairments:  Abnormal gait, Decreased activity tolerance, Decreased balance, Decreased strength, Decreased mobility, Difficulty walking, Dizziness, Pain, Decreased safety awareness, Decreased endurance,  Decreased range of motion  Visit Diagnosis: Unsteadiness on feet  Muscle weakness (generalized)     Problem List Patient Active Problem List   Diagnosis Date Noted  . IC (interstitial cystitis) 12/12/2015  . Solitary pulmonary nodule on lung CT 12/04/2015  . Pain, joint, hand 11/11/2015  . Coronary artery calcification seen on CAT scan 11/11/2015  . Multiple sclerosis ([place]) 11/11/2015  . Dizziness 11/11/2015  . Interstitial cystitis 10/24/2015  . Diabetic neuropathy with neurologic complication ([place]) [id number]  . Chronic pain of multiple joints 09/02/2015  . Right lower quadrant abdominal pain 08/02/2015  . Microscopic hematuria 07/12/2015  . Recurrent UTI 04/09/2015  . Dysuria 04/01/2015  . Carpal tunnel syndrome 01/12/2014  . DD (diverticular disease) 01/12/2014  . Carcinoma in situ, breast, ductal 01/12/2014  . H/O surgical procedure 01/12/2014  . H/O right mastectomy 01/12/2014  . Hypercholesteremia 01/12/2014  . DS (disseminated sclerosis) ([place]) 01/12/2014  . Arthritis, degenerative 01/12/2014  . Thyroid nodule 01/12/2014  . Cyanocobalamine deficiency (non anemic) 01/12/2014  . DM (diabetes mellitus) type II controlled, neurological manifestation ([place]) 06/26/2010  . Depression, major, recurrent, in partial remission ([place]) 06/26/2010  . Frequent falls 06/26/2010  . Stricture and stenosis of esophagus 06/26/2010  . Gastro-esophageal reflux disease without esophagitis 06/26/2010  . Fibromyalgia 06/26/2010  . Chronic interstitial cystitis 06/26/2010  . H/O malignant neoplasm of breast 06/26/2010  . Hypertension goal BP (blood pressure) < 140/90 06/01/2009  . H/O paroxysmal supraventricular tachycardia 06/01/2009  . Paroxysmal supraventricular tachycardia ([place]) 06/01/2009  . Irritable bowel syndrome with constipation and diarrhea 06/16/2008  . Diarrhea, functional 06/16/2008   Phillips Grout PT, DPT   Glorene Leitzke 12/22/2015, 2:39 PM  [place] MAIN [facility] SERVICES [address] [place], [place], [address] Phone: [phone]   Fax:  [phone]  Name: [name] MRN: [id number] Date of Birth: Oct 02, 1929

## 2015-12-22 NOTE — Patient Instructions (Signed)
SIT TO STAND: Feet Wide    Place feet apart. Lean chest forward. Raise hips and straighten knees to stand. _10__ reps, rest and then repeat. Perform _2__ sets per day, _7__ days per week. .  Toe / Heel Raise (Standing)    Standing with support, raise heels, then rock back on heels and raise toes. _10__ reps, rest and then repeat. Perform _2__ sets per day, _7__ days per week.  Single Leg - Eyes Open    Holding support, lift right leg while maintaining balance over other leg. Progress to removing hands from support surface for longer periods of time. Hold_30___ seconds total (may be in 3-5 second increments but the goal is to slowly increase the time. Repeat _3___ times per session on each leg. Do _2___ sessions per day.

## 2015-12-26 ENCOUNTER — Other Ambulatory Visit: Payer: Self-pay | Admitting: Family Medicine

## 2015-12-26 DIAGNOSIS — E7800 Pure hypercholesterolemia, unspecified: Secondary | ICD-10-CM

## 2015-12-26 DIAGNOSIS — E1140 Type 2 diabetes mellitus with diabetic neuropathy, unspecified: Secondary | ICD-10-CM

## 2015-12-26 NOTE — Telephone Encounter (Signed)
I spoke with patient; orders are in; she can come any time this week

## 2015-12-27 ENCOUNTER — Ambulatory Visit: Payer: Commercial Managed Care - HMO

## 2015-12-27 DIAGNOSIS — R2681 Unsteadiness on feet: Secondary | ICD-10-CM

## 2015-12-27 DIAGNOSIS — M6281 Muscle weakness (generalized): Secondary | ICD-10-CM | POA: Diagnosis not present

## 2015-12-27 NOTE — Therapy (Signed)
[place] MAIN [facility] SERVICES [address] [place], [place], [address] Phone: [phone]   Fax:  [phone]  Physical Therapy Treatment  Patient Details  Name: [name] MRN: [id number] Date of Birth: 1930/03/11 Referring Provider: Sanda Klein  Encounter Date: 12/27/2015      PT End of Session - 12/27/15 1523    Visit Number 3   Number of Visits 9   Date for PT Re-Evaluation 01/17/16   Authorization Type 3/10   PT Start Time 1350   PT Stop Time 1435   PT Time Calculation (min) 45 min   Equipment Utilized During Treatment Gait belt   Activity Tolerance Patient tolerated treatment well   Behavior During Therapy [facility] for tasks assessed/performed      Past Medical History  Diagnosis Date  . GERD (gastroesophageal reflux disease)   . Vertigo   . Essential hypertension, benign   . Paroxysmal supraventricular tachycardia ([place])   . Pure hypercholesterolemia   . MS (multiple sclerosis) ([place])   . Fibromyalgia   . Diverticulosis   . Diabetes mellitus without complication ([place])   . Osteoarthritis   . Dyslipidemia   . SOB (shortness of breath)     SECONDARY TO REFLUX  . Chest pain     SECONDARY TO GERD  . Peripheral neuropathy (HCC)     MILD  . Cancer ([place])     breast  . Depression   . Chronic interstitial cystitis   . History of fractured rib   . Chronic right hip pain   . History of TIA (transient ischemic attack)   . Carpal tunnel syndrome   . Thyroid nodule   . Hearing loss of left ear   . Vitamin B 12 deficiency   . Protein malnutrition ([place])   . Frequent falls   . Anxiety and depression   . Irritable bowel   . Recurrent UTI   . Solitary pulmonary nodule on lung CT 12/04/2015    3 mm LLL lung nodule June 2016, March 2017  . Tachycardia, paroxysmal (HCC)     Reportedly Paroxysmal Supraventricular Tachycardia, but unable to find documentation confirming this    Past Surgical History  Procedure Laterality Date  . Total  abdominal hysterectomy w/ bilateral salpingoophorectomy    . Mastectomy Right     DX MASTITIS/NO CANCER.Marland KitchenSALINE IMPLANT  . Cholecystectomy    . Hemorrhoid surgery      WITH RECONSTRUCTION  . Bladder tacking      X 3  . Eye surgeries      WITH BUCKLE DETACHMENT OF THE RETINA  . Tonsillectomy    . Appendectomy    . Tympanoplasty    . Breast surgery    . Mastectomy    . Breast lumpectomy    . Colonoscopy with propofol N/A 01/21/2015    Procedure: COLONOSCOPY WITH PROPOFOL;  Surgeon: Josefine Class, MD;  Location: [facility] ENDOSCOPY;  Service: Endoscopy;  Laterality: N/A;  . Esophagogastroduodenoscopy N/A 01/21/2015    Procedure: ESOPHAGOGASTRODUODENOSCOPY (EGD);  Surgeon: Josefine Class, MD;  Location: [facility] ENDOSCOPY;  Service: Endoscopy;  Laterality: N/A;  . Cardiac catheterization  10/2001    Normal coronary arteries with the exception of 20% proximal D1    There were no vitals filed for this visit.      Subjective Assessment - 12/27/15 1522    Subjective pt reports she has been compliant with HEP   Patient is accompained by: Family member   Limitations Walking;House hold activities  Patient Stated Goals "to not fall as much, to decrease pain, to be less dizzy."   Currently in Pain? Yes   Pain Location --  unable to rate. generalized body chronic pain   Pain Onset More than a month ago       Therex: Vitals assessed: 109/56mmHg 71 BPM      [facility] PT Assessment - 12/27/15 0001    Standardized Balance Assessment   Standardized Balance Assessment Berg Balance Test   Berg Balance Test   Sit to Stand Able to stand  independently using hands   Standing Unsupported Needs several tries to stand 30 seconds unsupported   Sitting with Back Unsupported but Feet Supported on Floor or Stool Able to sit safely and securely 2 minutes   Stand to Sit Sits safely with minimal use of hands   Transfers Able to transfer safely, definite need of hands   Standing Unsupported with Eyes  Closed Able to stand 3 seconds   Standing Ubsupported with Feet Together Able to place feet together independently but unable to hold for 30 seconds   From Standing, Reach Forward with Outstretched Arm Can reach forward >5 cm safely (2")   From Standing Position, Pick up Object from Floor Unable to pick up and needs supervision   From Standing Position, Turn to Look Behind Over each Shoulder Needs supervision when turning   Turn 360 Degrees Needs close supervision or verbal cueing   Standing Unsupported, Alternately Place Feet on Step/Stool Able to complete 4 steps without aid or supervision   Standing Unsupported, One Foot in Front Able to take small step independently and hold 30 seconds   Standing on One Leg Tries to lift leg/unable to hold 3 seconds but remains standing independently   Total Score 29     mini squat 2x10 Standing hip abduction 2x 10  HR / ankle DF 2x10 Pt requires mod verbal and tactile cues for proper exercise performance    NMR: standing on AIREX 30s x 3 wide BOS Standing on AIREX with weigth shifting AP/SS 1 min x 3 each  pt requires CGA-min A for safety on balance exercises   mod cues to attend to body sway and correct to reduce LOB                          PT Long Term Goals - 12/20/15 1507    PT LONG TERM GOAL #1   Title pt will improve berg balance score by 6pts to demo improved balance    Time 6   Period Weeks   Status New   PT LONG TERM GOAL #2   Title Patient will demonstrate improved safety by improving gait speed to 10 meters in < 20 sec.    Baseline 26 sec   Time 6   Period Weeks   Status New   PT LONG TERM GOAL #3   Title Patient will demonstrate improved mobility by performing 5x STS in < 20 sec.    Baseline 28s   Time 6   Period Weeks   Status New               Plan - 12/27/15 1523    Clinical Impression Statement pt scored 29/56 on Berg balance test indicating a high risk of falls. pt again urged to use  rollator at all times. pt needs mod cues throughout exercise for body awareness and to stand on task. pt is very unsteady even  just with standing still with wide BOS. pt had improved performance on AIREX with practice, however her reaction to body sway seems pretty delayed with leads to more risk of falls.    Rehab Potential Fair   Clinical Impairments Affecting Rehab Potential co-morbidities    PT Frequency 2x / week   PT Duration 6 weeks   PT Treatment/Interventions Moist Heat;Therapeutic exercise;Therapeutic activities;Gait training;Neuromuscular re-education;Balance training;Aquatic Therapy;Patient/family education;Manual techniques;Energy conservation;Vestibular;ADLs/Self Care Home Management;Canalith Repostioning;Cryotherapy;Electrical Stimulation   PT Next Visit Plan Continue balance and LE strengthening   PT Home Exercise Plan Sit to stand, heel/toe rocking, single leg balance   Consulted and Agree with Plan of Care Patient      Patient will benefit from skilled therapeutic intervention in order to improve the following deficits and impairments:  Abnormal gait, Decreased activity tolerance, Decreased balance, Decreased strength, Decreased mobility, Difficulty walking, Dizziness, Pain, Decreased safety awareness, Decreased endurance, Decreased range of motion  Visit Diagnosis: Unsteadiness on feet  Muscle weakness (generalized)     Problem List Patient Active Problem List   Diagnosis Date Noted  . IC (interstitial cystitis) 12/12/2015  . Solitary pulmonary nodule on lung CT 12/04/2015  . Pain, joint, hand 11/11/2015  . Coronary artery calcification seen on CAT scan 11/11/2015  . Multiple sclerosis ([place]) 11/11/2015  . Dizziness 11/11/2015  . Interstitial cystitis 10/24/2015  . Diabetic neuropathy with neurologic complication ([place]) [id number]  . Chronic pain of multiple joints 09/02/2015  . Right lower quadrant abdominal pain 08/02/2015  . Microscopic hematuria 07/12/2015  .  Recurrent UTI 04/09/2015  . Dysuria 04/01/2015  . Carpal tunnel syndrome 01/12/2014  . DD (diverticular disease) 01/12/2014  . Carcinoma in situ, breast, ductal 01/12/2014  . H/O surgical procedure 01/12/2014  . H/O right mastectomy 01/12/2014  . Hypercholesteremia 01/12/2014  . DS (disseminated sclerosis) ([place]) 01/12/2014  . Arthritis, degenerative 01/12/2014  . Thyroid nodule 01/12/2014  . Cyanocobalamine deficiency (non anemic) 01/12/2014  . DM (diabetes mellitus) type II controlled, neurological manifestation ([place]) 06/26/2010  . Depression, major, recurrent, in partial remission ([place]) 06/26/2010  . Frequent falls 06/26/2010  . Stricture and stenosis of esophagus 06/26/2010  . Gastro-esophageal reflux disease without esophagitis 06/26/2010  . Fibromyalgia 06/26/2010  . Chronic interstitial cystitis 06/26/2010  . H/O malignant neoplasm of breast 06/26/2010  . Hypertension goal BP (blood pressure) < 140/90 06/01/2009  . H/O paroxysmal supraventricular tachycardia 06/01/2009  . Paroxysmal supraventricular tachycardia ([place]) 06/01/2009  . Irritable bowel syndrome with constipation and diarrhea 06/16/2008  . Diarrhea, functional 06/16/2008   Gorden Harms. Joandry Slagter, PT, DPT [phone]   Tobie Hellen 12/27/2015, 3:30 PM  [place] MAIN [facility] SERVICES [address] [place], [place], [address] Phone: [phone]   Fax:  [phone]  Name: SABRIYAH JUDE MRN: [id number] Date of Birth: Dec 30, 1929

## 2015-12-27 NOTE — Patient Instructions (Signed)
[email or web address] Standing hip abduction  2 x 10

## 2015-12-28 DIAGNOSIS — E1140 Type 2 diabetes mellitus with diabetic neuropathy, unspecified: Secondary | ICD-10-CM | POA: Diagnosis not present

## 2015-12-29 ENCOUNTER — Encounter: Payer: Self-pay | Admitting: Urology

## 2015-12-29 ENCOUNTER — Ambulatory Visit (INDEPENDENT_AMBULATORY_CARE_PROVIDER_SITE_OTHER): Payer: Commercial Managed Care - HMO | Admitting: Urology

## 2015-12-29 ENCOUNTER — Ambulatory Visit: Payer: Commercial Managed Care - HMO

## 2015-12-29 ENCOUNTER — Other Ambulatory Visit: Payer: Self-pay

## 2015-12-29 ENCOUNTER — Other Ambulatory Visit
Admission: RE | Admit: 2015-12-29 | Discharge: 2015-12-29 | Disposition: A | Discharge status: Home / Self Care | Payer: Commercial Managed Care - HMO | Source: Ambulatory Visit | Attending: Family Medicine | Admitting: Family Medicine

## 2015-12-29 ENCOUNTER — Other Ambulatory Visit: Payer: Self-pay | Admitting: Family Medicine

## 2015-12-29 VITALS — BP 113/87 | HR 73 | Ht 60.0 in | Wt 156.7 lb

## 2015-12-29 DIAGNOSIS — R790 Abnormal level of blood mineral: Secondary | ICD-10-CM | POA: Insufficient documentation

## 2015-12-29 DIAGNOSIS — N3010 Interstitial cystitis (chronic) without hematuria: Secondary | ICD-10-CM | POA: Diagnosis not present

## 2015-12-29 DIAGNOSIS — R3129 Other microscopic hematuria: Secondary | ICD-10-CM | POA: Diagnosis not present

## 2015-12-29 DIAGNOSIS — R2681 Unsteadiness on feet: Secondary | ICD-10-CM

## 2015-12-29 DIAGNOSIS — E1140 Type 2 diabetes mellitus with diabetic neuropathy, unspecified: Secondary | ICD-10-CM

## 2015-12-29 DIAGNOSIS — M6281 Muscle weakness (generalized): Secondary | ICD-10-CM | POA: Diagnosis not present

## 2015-12-29 DIAGNOSIS — N390 Urinary tract infection, site not specified: Secondary | ICD-10-CM

## 2015-12-29 DIAGNOSIS — E876 Hypokalemia: Secondary | ICD-10-CM

## 2015-12-29 LAB — URINALYSIS, COMPLETE
Bilirubin, UA: NEGATIVE
Glucose, UA: NEGATIVE
KETONES UA: NEGATIVE
Nitrite, UA: NEGATIVE
PH UA: 5 (ref 5.0–7.5)
Protein, UA: NEGATIVE
SPEC GRAV UA: 1.025 (ref 1.005–1.030)
Urobilinogen, Ur: 0.2 mg/dL (ref 0.2–1.0)

## 2015-12-29 LAB — LIPID PANEL W/O CHOL/HDL RATIO
Cholesterol, Total: 218 mg/dL — ABNORMAL HIGH (ref 100–199)
HDL: 80 mg/dL (ref >39)
LDL Calculated: 120 mg/dL — ABNORMAL HIGH (ref 0–99)
Triglycerides: 91 mg/dL (ref 0–149)
VLDL Cholesterol Cal: 18 mg/dL (ref 5–40)

## 2015-12-29 LAB — HEMOGLOBIN A1C
ESTIMATED AVERAGE GLUCOSE: 163 mg/dL
HEMOGLOBIN A1C: 7.3 % — AB (ref 4.8–5.6)

## 2015-12-29 LAB — MICROSCOPIC EXAMINATION: BACTERIA UA: NONE SEEN

## 2015-12-29 LAB — BASIC METABOLIC PANEL
BUN / CREAT RATIO: 18 (ref 12–28)
BUN: 18 mg/dL (ref 8–27)
CALCIUM: 9.5 mg/dL (ref 8.7–10.3)
CHLORIDE: 104 mmol/L (ref 96–106)
CO2: 26 mmol/L (ref 18–29)
Creatinine, Ser: 0.98 mg/dL (ref 0.57–1.00)
GFR, EST AFRICAN AMERICAN: 61 mL/min/{1.73_m2} (ref >59)
GFR, EST NON AFRICAN AMERICAN: 53 mL/min/{1.73_m2} — AB (ref >59)
Glucose: 123 mg/dL — ABNORMAL HIGH (ref 65–99)
POTASSIUM: 5.5 mmol/L — AB (ref 3.5–5.2)
SODIUM: 144 mmol/L (ref 134–144)

## 2015-12-29 LAB — POTASSIUM: Potassium: 4.4 mmol/L (ref 3.5–5.1)

## 2015-12-29 MED ORDER — METFORMIN HCL 500 MG PO TABS: 500.0000 mg | ORAL_TABLET | Freq: Two times a day (BID) | ORAL | Status: DC

## 2015-12-29 MED ORDER — AMOXICILLIN-POT CLAVULANATE 875-125 MG PO TABS: 1.0000 | ORAL_TABLET | Freq: Two times a day (BID) | ORAL | Status: DC

## 2015-12-29 MED ORDER — ROSUVASTATIN CALCIUM 5 MG PO TABS: 5.0000 mg | ORAL_TABLET | Freq: Every day | ORAL | Status: DC

## 2015-12-29 MED ORDER — SODIUM BICARBONATE 8.4 % IV SOLN
11.0000 mL | Freq: Once | INTRAVENOUS | Status: AC
  Administered 2015-12-29: 11 mL

## 2015-12-29 MED ORDER — LIDOCAINE HCL 2 % EX GEL
1.0000 "application " | Freq: Once | CUTANEOUS | Status: AC
  Administered 2015-12-29: 1 via URETHRAL

## 2015-12-29 NOTE — Therapy (Signed)
[place] MAIN [facility] SERVICES [address] [place], [place], [address] Phone: [phone]   Fax:  [phone]  Physical Therapy Treatment  Patient Details  Name: [name] MRN: [id number] Date of Birth: 1929-12-22 Referring Provider: Sanda Klein  Encounter Date: 12/29/2015      PT End of Session - 12/29/15 1441    Visit Number 4   Number of Visits 9   Date for PT Re-Evaluation 01/17/16   Authorization Type 4/10   PT Start Time [id number]   PT Stop Time 1427   PT Time Calculation (min) 42 min   Equipment Utilized During Treatment Gait belt   Activity Tolerance Patient tolerated treatment well   Behavior During Therapy [facility] for tasks assessed/performed      Past Medical History  Diagnosis Date  . GERD (gastroesophageal reflux disease)   . Vertigo   . Essential hypertension, benign   . Paroxysmal supraventricular tachycardia ([place])   . Pure hypercholesterolemia   . MS (multiple sclerosis) ([place])   . Fibromyalgia   . Diverticulosis   . Diabetes mellitus without complication ([place])   . Osteoarthritis   . Dyslipidemia   . SOB (shortness of breath)     SECONDARY TO REFLUX  . Chest pain     SECONDARY TO GERD  . Peripheral neuropathy (HCC)     MILD  . Cancer ([place])     breast  . Depression   . Chronic interstitial cystitis   . History of fractured rib   . Chronic right hip pain   . History of TIA (transient ischemic attack)   . Carpal tunnel syndrome   . Thyroid nodule   . Hearing loss of left ear   . Vitamin B 12 deficiency   . Protein malnutrition ([place])   . Frequent falls   . Anxiety and depression   . Irritable bowel   . Recurrent UTI   . Solitary pulmonary nodule on lung CT 12/04/2015    3 mm LLL lung nodule June 2016, March 2017  . Tachycardia, paroxysmal (HCC)     Reportedly Paroxysmal Supraventricular Tachycardia, but unable to find documentation confirming this    Past Surgical History  Procedure Laterality Date  . Total  abdominal hysterectomy w/ bilateral salpingoophorectomy    . Mastectomy Right     DX MASTITIS/NO CANCER.Marland KitchenSALINE IMPLANT  . Cholecystectomy    . Hemorrhoid surgery      WITH RECONSTRUCTION  . Bladder tacking      X 3  . Eye surgeries      WITH BUCKLE DETACHMENT OF THE RETINA  . Tonsillectomy    . Appendectomy    . Tympanoplasty    . Breast surgery    . Mastectomy    . Breast lumpectomy    . Colonoscopy with propofol N/A 01/21/2015    Procedure: COLONOSCOPY WITH PROPOFOL;  Surgeon: Josefine Class, MD;  Location: [facility] ENDOSCOPY;  Service: Endoscopy;  Laterality: N/A;  . Esophagogastroduodenoscopy N/A 01/21/2015    Procedure: ESOPHAGOGASTRODUODENOSCOPY (EGD);  Surgeon: Josefine Class, MD;  Location: [facility] ENDOSCOPY;  Service: Endoscopy;  Laterality: N/A;  . Cardiac catheterization  10/2001    Normal coronary arteries with the exception of 20% proximal D1    There were no vitals filed for this visit.      Subjective Assessment - 12/29/15 1402    Subjective she was sore after last visit, but better now   Patient is accompained by: Family member   Limitations Walking;House hold  activities   Patient Stated Goals "to not fall as much, to decrease pain, to be less dizzy."   Currently in Pain? No/denies   Pain Onset More than a month ago     therex: Nustep L 2 x 3 min warm up no charge Leg press 75lbs 3 x 15 Pt requires min verbal and tactile cues for proper exercise performance    NMR: Standing on AIRE[employer] static balance x 5 min with cues to stay on task and cues to attend to proprioception in feet to correct various postural sway Standing on AIREX with SS head turns 3 x 10  Standing on AIRE[employer] EO/EC 10s x 6 Fwd/ retro walking in // bars no UE x 5 laps CGA for balance Standing calf stretch on wall 20s x 3  Each leg pt need mod cues   pt requires CGA for safety on balance exercises                              PT Education - 12/29/15 1441    Education  provided Yes   Education Details standing calf stretch    Person(s) Educated Patient   Methods Explanation   Comprehension Verbalized understanding;Returned demonstration             PT Long Term Goals - 12/20/15 1507    PT LONG TERM GOAL #1   Title pt will improve berg balance score by 6pts to demo improved balance    Time 6   Period Weeks   Status New   PT LONG TERM GOAL #2   Title Patient will demonstrate improved safety by improving gait speed to 10 meters in < 20 sec.    Baseline 26 sec   Time 6   Period Weeks   Status New   PT LONG TERM GOAL #3   Title Patient will demonstrate improved mobility by performing 5x STS in < 20 sec.    Baseline 28s   Time 6   Period Weeks   Status New               Plan - 12/29/15 1442    Clinical Impression Statement pt has trouble staying focused on task at hand during session needing cues for redirection. pt was more steady on compliant surface today with practice, PT able to progress exercises standing on the AIREX. pt has has no falls x 2 weeks   Rehab Potential Fair   Clinical Impairments Affecting Rehab Potential co-morbidities    PT Frequency 2x / week   PT Duration 6 weeks   PT Treatment/Interventions Moist Heat;Therapeutic exercise;Therapeutic activities;Gait training;Neuromuscular re-education;Balance training;Aquatic Therapy;Patient/family education;Manual techniques;Energy conservation;Vestibular;ADLs/Self Care Home Management;Canalith Repostioning;Cryotherapy;Electrical Stimulation   PT Next Visit Plan Continue balance and LE strengthening   PT Home Exercise Plan Sit to stand, heel/toe rocking, single leg balance   Consulted and Agree with Plan of Care Patient      Patient will benefit from skilled therapeutic intervention in order to improve the following deficits and impairments:  Abnormal gait, Decreased activity tolerance, Decreased balance, Decreased strength, Decreased mobility, Difficulty walking, Dizziness,  Pain, Decreased safety awareness, Decreased endurance, Decreased range of motion  Visit Diagnosis: Unsteadiness on feet  Muscle weakness (generalized)     Problem List Patient Active Problem List   Diagnosis Date Noted  . IC (interstitial cystitis) 12/12/2015  . Solitary pulmonary nodule on lung CT 12/04/2015  . Pain, joint, hand 11/11/2015  . Coronary  artery calcification seen on CAT scan 11/11/2015  . Multiple sclerosis ([place]) 11/11/2015  . Dizziness 11/11/2015  . Interstitial cystitis 10/24/2015  . Diabetic neuropathy with neurologic complication ([place]) [id number]  . Chronic pain of multiple joints 09/02/2015  . Right lower quadrant abdominal pain 08/02/2015  . Microscopic hematuria 07/12/2015  . Recurrent UTI 04/09/2015  . Dysuria 04/01/2015  . Carpal tunnel syndrome 01/12/2014  . DD (diverticular disease) 01/12/2014  . Carcinoma in situ, breast, ductal 01/12/2014  . H/O surgical procedure 01/12/2014  . H/O right mastectomy 01/12/2014  . Hypercholesteremia 01/12/2014  . DS (disseminated sclerosis) ([place]) 01/12/2014  . Arthritis, degenerative 01/12/2014  . Thyroid nodule 01/12/2014  . Cyanocobalamine deficiency (non anemic) 01/12/2014  . DM (diabetes mellitus) type II controlled, neurological manifestation ([place]) 06/26/2010  . Depression, major, recurrent, in partial remission ([place]) 06/26/2010  . Frequent falls 06/26/2010  . Stricture and stenosis of esophagus 06/26/2010  . Gastro-esophageal reflux disease without esophagitis 06/26/2010  . Fibromyalgia 06/26/2010  . Chronic interstitial cystitis 06/26/2010  . H/O malignant neoplasm of breast 06/26/2010  . Hypertension goal BP (blood pressure) < 140/90 06/01/2009  . H/O paroxysmal supraventricular tachycardia 06/01/2009  . Paroxysmal supraventricular tachycardia ([place]) 06/01/2009  . Irritable bowel syndrome with constipation and diarrhea 06/16/2008  . Diarrhea, functional 06/16/2008   Gorden Harms. Martrell Eguia, PT, DPT  [phone]  Tirsa Gail 12/29/2015, 2:43 PM  [place] MAIN [facility] SERVICES [address] [place], [place], [address] Phone: [phone]   Fax:  [phone]  Name: [name] MRN: [id number] Date of Birth: 1930/07/26

## 2015-12-29 NOTE — Progress Notes (Signed)
Bladder Rescue Solution Instillation  Due to interstitial cystitis patient is present today for a Rescue Solution Treatment.  Patient was cleaned and prepped in a sterile fashion with betadine and lidocaine 2% jelly was instilled into the urethra.  A 14FR catheter was inserted, urine return was noted 47ml, urine was clear and yell0w in color.  Instilled a solution consisting of 81ml of Sodium Bicarb, 2 ml Lidocaine and 1 ml of Heparin. The catheter was then removed. Patient tolerated well, no complications were noted.   Performed by: Lyndee Hensen CMA  Follow up/ Additional Notes: One week

## 2015-12-29 NOTE — Progress Notes (Signed)
11:50 AM   [name] [birth year] [id number]  Referring provider: Arnetha Courser, MD [address] [place], [place] [address]  Chief Complaint  Patient presents with  . Interstitial cystitis    Rescue Solution    HPI: Patient is an 82 year old white female with a history of IC who presents today for rescue installation.     Patient has been experiencing falls recently. She has been evaluated by cardiology and they feel that this is not of a cardiac nature.   She is currently working with physical therapist.    Today, she states she is having dysuria and nocturia.  She denies fever, chills, nausea or vomiting. Her UA today is positive for 3-10 RBC's per high-power field and 6-10 WBC's/hpf.    Patient had a CT abdomen and pelvis wo contrast on 08/03/2015 which noted a stable hyperdense cyst in the left kidney.  She had a cystoscopy in 2015 which revealed no malignancies.     PMH: Past Medical History  Diagnosis Date  . GERD (gastroesophageal reflux disease)   . Vertigo   . Essential hypertension, benign   . Paroxysmal supraventricular tachycardia ([place])   . Pure hypercholesterolemia   . MS (multiple sclerosis) ([place])   . Fibromyalgia   . Diverticulosis   . Diabetes mellitus without complication ([place])   . Osteoarthritis   . Dyslipidemia   . SOB (shortness of breath)     SECONDARY TO REFLUX  . Chest pain     SECONDARY TO GERD  . Peripheral neuropathy (HCC)     MILD  . Cancer ([place])     breast  . Depression   . Chronic interstitial cystitis   . History of fractured rib   . Chronic right hip pain   . History of TIA (transient ischemic attack)   . Carpal tunnel syndrome   . Thyroid nodule   . Hearing loss of left ear   . Vitamin B 12 deficiency   . Protein malnutrition ([place])   . Frequent falls   . Anxiety and depression   . Irritable bowel   . Recurrent UTI   . Solitary pulmonary nodule on lung CT 12/04/2015    3 mm LLL lung nodule June 2016, March  2017  . Tachycardia, paroxysmal (HCC)     Reportedly Paroxysmal Supraventricular Tachycardia, but unable to find documentation confirming this    Surgical History: Past Surgical History  Procedure Laterality Date  . Total abdominal hysterectomy w/ bilateral salpingoophorectomy    . Mastectomy Right     DX MASTITIS/NO CANCER.Marland KitchenSALINE IMPLANT  . Cholecystectomy    . Hemorrhoid surgery      WITH RECONSTRUCTION  . Bladder tacking      X 3  . Eye surgeries      WITH BUCKLE DETACHMENT OF THE RETINA  . Tonsillectomy    . Appendectomy    . Tympanoplasty    . Breast surgery    . Mastectomy    . Breast lumpectomy    . Colonoscopy with propofol N/A 01/21/2015    Procedure: COLONOSCOPY WITH PROPOFOL;  Surgeon: Josefine Class, MD;  Location: [facility] ENDOSCOPY;  Service: Endoscopy;  Laterality: N/A;  . Esophagogastroduodenoscopy N/A 01/21/2015    Procedure: ESOPHAGOGASTRODUODENOSCOPY (EGD);  Surgeon: Josefine Class, MD;  Location: [facility] ENDOSCOPY;  Service: Endoscopy;  Laterality: N/A;  . Cardiac catheterization  10/2001    Normal coronary arteries with the exception of 20% proximal D1    Home  Medications:    Medication List       This list is accurate as of: 12/29/15 11:50 AM.  Always use your most recent med list.               acetaminophen 500 MG tablet  Commonly known as:  TYLENOL  Take by mouth.     amoxicillin-clavulanate 875-125 MG tablet  Commonly known as:  AUGMENTIN  Take 1 tablet by mouth every 12 (twelve) hours.     aspirin EC 81 MG tablet  Take 1 tablet (81 mg total) by mouth daily.     b complex vitamins tablet  Take 1 tablet by mouth daily.     [employer] VITAMIN B12 2000 MCG tablet  Generic drug:  cyanocobalamin  Take 2,000 mcg by mouth daily.     dicyclomine 10 MG capsule  Commonly known as:  BENTYL  Take by mouth.     ferrous sulfate 325 (65 FE) MG tablet  Take 1 tablet (325 mg total) by mouth daily with breakfast.     fluticasone 50 MCG/ACT nasal  spray  Commonly known as:  FLONASE  SPRAY 2 SPRAYS EACH INTO BOTH NOSTRILS ONCE DAILY EACH NIGHT.     GAS-X EXTRA STRENGTH 125 MG Caps  Generic drug:  Simethicone  Take 1 capsule by mouth as needed. Reported on 11/24/2015     GENTEAL OP  Apply 1 drop to eye. Each eye as needed     hydrocortisone 25 MG suppository  Commonly known as:  ANUSOL-HC  Place 1 suppository (25 mg total) rectally 2 (two) times daily.     loperamide 2 MG tablet  Commonly known as:  IMODIUM A-D  Take 2 mg by mouth 4 (four) times daily as needed for diarrhea or loose stools.     meclizine 25 MG tablet  Commonly known as:  ANTIVERT  TAKE ONE TO TWO TABLETS BY MOUTH EVERY 8 HOURS AS NEEDED     metFORMIN 500 MG tablet  Commonly known as:  GLUCOPHAGE  Take 1 tablet (500 mg total) by mouth 2 (two) times daily with a meal.     neomycin-polymyxin-hydrocortisone 3.5-10000-1 otic suspension  Commonly known as:  CORTISPORIN     omeprazole 40 MG capsule  Commonly known as:  PRILOSEC  Take 40 mg by mouth daily.     ondansetron 4 MG tablet  Commonly known as:  ZOFRAN  Take 4 mg by mouth every 8 (eight) hours as needed for nausea or vomiting. Reported on 12/22/2015     polyethylene glycol packet  Commonly known as:  MIRALAX / GLYCOLAX  Take 17 g by mouth daily as needed. Reported on 11/15/2015     PROBIOTIC COLON SUPPORT Caps  Take 1 capsule by mouth daily.     rosuvastatin 5 MG tablet  Commonly known as:  CRESTOR  Take 1 tablet (5 mg total) by mouth daily at 6 PM.     sertraline 50 MG tablet  Commonly known as:  ZOLOFT  Take 1 tablet (50 mg total) by mouth daily.     URIBEL 118 MG Caps  One tablet four times daily as needed with 8 oz of water     VITAMIN D-1000 MAX ST 1000 units tablet  Generic drug:  Cholecalciferol  Take 1,000 Units by mouth daily. Reported on 12/15/2015     Vitamin D3 50000 units Caps  Take 5,000 Int'l Units/day by mouth daily. Reported on 12/29/2015     Vitamins A & D 5000-400  units Caps  Take 1 capsule by mouth daily. Reported on 12/29/2015        Allergies:  Allergies  Allergen Reactions  . Biaxin [Clarithromycin] Other (See Comments) and Diarrhea  . Copaxone [Glatiramer Acetate]   . Decadron [Dexamethasone] Nausea And Vomiting and Other (See Comments)    Other reaction(s): Muscle Pain Reaction:  Abdominal pain  . Dexamethasone Sodium Phosphate Other (See Comments)  . Diltiazem Hcl Other (See Comments)    Reaction:  Unknown   . Diltiazem Hcl Other (See Comments)  . Flagyl [Metronidazole] Nausea Only and Diarrhea    Other reaction(s): Vomiting  . Gabapentin Other (See Comments)    "burning all over" Reaction:  Unknown   . Iohexol Swelling and Other (See Comments)     Desc: tongue swelling with "IVP dye" sccording to nurses notes with a lumbar myelo-12/09- asm, Onset Date: [id number]  Pts tongue swells.  Clementeen Hoof [Iodinated Diagnostic Agents] Swelling and Other (See Comments)    Passed out  Pts tongue swells.   . Levothyroxine Nausea Only  . Sulfa Antibiotics Other (See Comments)    Reaction:  Unknown   . Sulfonamide Derivatives Other (See Comments)    Reaction:  Unknown   . Synthroid [Levothyroxine Sodium]   . Copaxone  [Glatiramer] Rash  . Interferon Beta-1a Other (See Comments) and Rash    Reaction:  Unknown     Family History: Family History  Problem Relation Age of Onset  . Arthritis Sister   . Aneurysm Brother   . Heart disease Daughter   . ALS Mother   . Colon cancer    . Kidney disease Sister     Social History:  reports that she has never smoked. She has never used smokeless tobacco. She reports that she does not drink alcohol or use illicit drugs.  ROS: UROLOGY Frequent Urination?: Yes Hard to postpone urination?: Yes Burning/pain with urination?: Yes Get up at night to urinate?: Yes Leakage of urine?: Yes Urine stream starts and stops?: No Trouble starting stream?: No Do you have to strain to urinate?: No Blood in  urine?: No Urinary tract infection?: No Sexually transmitted disease?: No Injury to kidneys or bladder?: No Painful intercourse?: No Weak stream?: No Currently pregnant?: No Vaginal bleeding?: No Last menstrual period?: n  Gastrointestinal Nausea?: No Vomiting?: No Indigestion/heartburn?: No Diarrhea?: Yes Constipation?: Yes  Constitutional Fever: No Night sweats?: Yes Weight loss?: No Fatigue?: Yes  Skin Skin rash/lesions?: No Itching?: No  Eyes Blurred vision?: Yes Double vision?: No  Ears/Nose/Throat Sore throat?: Yes Sinus problems?: Yes  Hematologic/Lymphatic Swollen glands?: No Easy bruising?: No  Cardiovascular Leg swelling?: No Chest pain?: No  Respiratory Cough?: No Shortness of breath?: No  Endocrine Excessive thirst?: No  Musculoskeletal Back pain?: Yes Joint pain?: Yes  Neurological Headaches?: Yes Dizziness?: Yes  Psychologic Depression?: Yes Anxiety?: Yes  Physical Exam: Blood pressure 113/87, pulse 73, height 5' (1.524 m), weight 156 lb 11.2 oz (71.079 kg). Constitutional: Well nourished. Alert and oriented, No acute distress. HEENT: [place] AT, moist mucus membranes. Trachea midline, no masses. Cardiovascular: No clubbing, cyanosis, or edema. Respiratory: Normal respiratory effort, no increased work of breathing. GI: Abdomen is soft, non tender, non distended, no abdominal masses. GU: No CVA tenderness.  No bladder fullness or masses.   Skin: No rashes, bruises or suspicious lesions. Lymph: No cervical or inguinal adenopathy. Neurologic: Grossly intact, no focal deficits, moving all 4 extremities. Psychiatric: Normal mood and affect.  Laboratory Data: Lab Results  Component  Value Date   WBC 4.3 10/31/2015   HGB 13.6 10/31/2015   HCT 40.9 10/31/2015   MCV 91.0 10/31/2015   PLT 190 10/31/2015   Lab Results  Component Value Date   CREATININE 0.98 12/28/2015   Lab Results  Component Value Date   HGBA1C 7.3* 12/28/2015      Urinalysis: Results for orders placed or performed in visit on 12/29/15  CULTURE, URINE COMPREHENSIVE  Result Value Ref Range   Urine Culture, Comprehensive Final report    Result 1 Comment   Microscopic Examination  Result Value Ref Range   WBC, UA 6-10 (A) 0 -  5 /hpf   RBC, UA 3-10 (A) 0 -  2 /hpf   Epithelial Cells (non renal) 0-10 0 - 10 /hpf   Bacteria, UA None seen None seen/Few  Urinalysis, Complete  Result Value Ref Range   Specific Gravity, UA 1.025 1.005 - 1.030   pH, UA 5.0 5.0 - 7.5   Color, UA Yellow Yellow   Appearance Ur Hazy (A) Clear   Leukocytes, UA 2+ (A) Negative   Protein, UA Negative Negative/Trace   Glucose, UA Negative Negative   Ketones, UA Negative Negative   RBC, UA Trace (A) Negative   Bilirubin, UA Negative Negative   Urobilinogen, Ur 0.2 0.2 - 1.0 mg/dL   Nitrite, UA Negative Negative   Microscopic Examination See below:    Assessment & Plan:    1. Interstitial cystitis:  Patient's rescue installation not performed today.  She will return next week for her next treatment.  3-10 RBC's/hpf and 6-10 WBC's/hpf were noted on today's UA.   Urine sent for culture.    2. Recurrent UTI's:   UA is suspicious for infection.   We will send for culture.   3. Microscopic hematuria:   Patient's UA contained 3-10 RBC's/hpf at today's visit.  I will send for culture.    4. Falling:   Patient has been evaluated by cardiology and they do not feel the falls other results of a cardiac issue.  We will need to assist the patient with undressing and getting on exam tables at this time.   Return in about 1 week (around 01/05/2016) for rescue solution.  Zara Council, PA-C  [facility] [address], [place], [place] [address] [phone]  Addendum:   Patient's urine culture from this visit was negative.  She did have microscopic hematuria. She did have a CT non contrast and RUS in 07/2015 and no distinct pathology was  discovered.  Her last cystoscopy was in 2015. She needs to have another one.

## 2016-01-01 LAB — CULTURE, URINE COMPREHENSIVE

## 2016-01-03 ENCOUNTER — Ambulatory Visit: Payer: Commercial Managed Care - HMO

## 2016-01-03 ENCOUNTER — Telehealth: Payer: Self-pay

## 2016-01-03 DIAGNOSIS — M6281 Muscle weakness (generalized): Secondary | ICD-10-CM

## 2016-01-03 DIAGNOSIS — R2681 Unsteadiness on feet: Secondary | ICD-10-CM

## 2016-01-03 NOTE — Telephone Encounter (Signed)
-----   Message from Nori Riis, PA-C sent at 01/02/2016  8:24 PM EDT ----- Patient's urine culture was negative.  She did have microscopic hematuria.  Her last cystoscopy was in 2015.  She needs to have another one.

## 2016-01-03 NOTE — Telephone Encounter (Signed)
Spoke with pt in reference to -ucx. Also made aware Larene [place] wanted her to have a cysto. Pt voiced understanding of both. Pt was transferred to the front to make f/u appt.

## 2016-01-03 NOTE — Therapy (Signed)
[place] MAIN [facility] SERVICES [address] [place], [place], [address] Phone: [phone]   Fax:  [phone]  Physical Therapy Treatment  Patient Details  Name: [name] MRN: [id number] Date of Birth: 1930/04/24 Referring Provider: Sanda Klein  Encounter Date: 01/03/2016      PT End of Session - 01/03/16 1456    Visit Number 5   Number of Visits 9   Date for PT Re-Evaluation 01/17/16   Authorization Type 5/10   PT Start Time [id number]   PT Stop Time 1429   PT Time Calculation (min) 44 min   Equipment Utilized During Treatment Gait belt   Activity Tolerance Patient tolerated treatment well   Behavior During Therapy [facility] for tasks assessed/performed      Past Medical History  Diagnosis Date  . GERD (gastroesophageal reflux disease)   . Vertigo   . Essential hypertension, benign   . Paroxysmal supraventricular tachycardia ([place])   . Pure hypercholesterolemia   . MS (multiple sclerosis) ([place])   . Fibromyalgia   . Diverticulosis   . Diabetes mellitus without complication ([place])   . Osteoarthritis   . Dyslipidemia   . SOB (shortness of breath)     SECONDARY TO REFLUX  . Chest pain     SECONDARY TO GERD  . Peripheral neuropathy (HCC)     MILD  . Cancer ([place])     breast  . Depression   . Chronic interstitial cystitis   . History of fractured rib   . Chronic right hip pain   . History of TIA (transient ischemic attack)   . Carpal tunnel syndrome   . Thyroid nodule   . Hearing loss of left ear   . Vitamin B 12 deficiency   . Protein malnutrition ([place])   . Frequent falls   . Anxiety and depression   . Irritable bowel   . Recurrent UTI   . Solitary pulmonary nodule on lung CT 12/04/2015    3 mm LLL lung nodule June 2016, March 2017  . Tachycardia, paroxysmal (HCC)     Reportedly Paroxysmal Supraventricular Tachycardia, but unable to find documentation confirming this    Past Surgical History  Procedure Laterality Date  . Total  abdominal hysterectomy w/ bilateral salpingoophorectomy    . Mastectomy Right     DX MASTITIS/NO CANCER.Marland KitchenSALINE IMPLANT  . Cholecystectomy    . Hemorrhoid surgery      WITH RECONSTRUCTION  . Bladder tacking      X 3  . Eye surgeries      WITH BUCKLE DETACHMENT OF THE RETINA  . Tonsillectomy    . Appendectomy    . Tympanoplasty    . Breast surgery    . Mastectomy    . Breast lumpectomy    . Colonoscopy with propofol N/A 01/21/2015    Procedure: COLONOSCOPY WITH PROPOFOL;  Surgeon: Josefine Class, MD;  Location: [facility] ENDOSCOPY;  Service: Endoscopy;  Laterality: N/A;  . Esophagogastroduodenoscopy N/A 01/21/2015    Procedure: ESOPHAGOGASTRODUODENOSCOPY (EGD);  Surgeon: Josefine Class, MD;  Location: [facility] ENDOSCOPY;  Service: Endoscopy;  Laterality: N/A;  . Cardiac catheterization  10/2001    Normal coronary arteries with the exception of 20% proximal D1    There were no vitals filed for this visit.      Subjective Assessment - 01/03/16 1455    Subjective pt repots no falls in over 2 weeks. she reports LOB, but she was able to catch herself. pt is pleased with  her progress.    Patient is accompained by: Family member   Limitations Walking;House hold activities   Patient Stated Goals "to not fall as much, to decrease pain, to be less dizzy."   Currently in Pain? Yes   Pain Score 5    Pain Location Back   Pain Orientation Lower   Pain Descriptors / Indicators Aching   Pain Type Chronic pain   Pain Onset More than a month ago     NMR: Nustep  L 3  X 3 min no charge warm up Leg press 100lbs 2 x 12 Fwd lunge no UE 2 x 10 each side Side lunge no UE 2 x 10 each side Pt needs mod tactile and verbal cues for proper performance, CGA for safety Standing on [employer] SS and up/down head turns 2 x 10 each CGA -min A for safety  Standing on AI[employer] EO/EC 10 s x 10 Pt cued to attend to task at hand, cued to focus on proprioceptive feedback.                              PT Education - 01/03/16 1456    Education provided Yes   Education Details focus on proprioception during balance exercise    Person(s) Educated Patient   Methods Explanation   Comprehension Verbalized understanding;Verbal cues required             PT Long Term Goals - 12/20/15 1507    PT LONG TERM GOAL #1   Title pt will improve berg balance score by 6pts to demo improved balance    Time 6   Period Weeks   Status New   PT LONG TERM GOAL #2   Title Patient will demonstrate improved safety by improving gait speed to 10 meters in < 20 sec.    Baseline 26 sec   Time 6   Period Weeks   Status New   PT LONG TERM GOAL #3   Title Patient will demonstrate improved mobility by performing 5x STS in < 20 sec.    Baseline 28s   Time 6   Period Weeks   Status New               Plan - 01/03/16 1457    Clinical Impression Statement pt seemingly is progressing well in PT. she has had no falls in over 2 weeks, whereas per her report prior to PT she was falling multiple times daily. progressed NMR today focusing on weight shift and stepping/footclearance when was challenging to her. she shows improved performance on compliant surfaces but is still challenged by this.    Rehab Potential Fair   Clinical Impairments Affecting Rehab Potential co-morbidities    PT Frequency 2x / week   PT Duration 6 weeks   PT Treatment/Interventions Moist Heat;Therapeutic exercise;Therapeutic activities;Gait training;Neuromuscular re-education;Balance training;Aquatic Therapy;Patient/family education;Manual techniques;Energy conservation;Vestibular;ADLs/Self Care Home Management;Canalith Repostioning;Cryotherapy;Electrical Stimulation   PT Next Visit Plan Continue balance and LE strengthening   PT Home Exercise Plan Sit to stand, heel/toe rocking, single leg balance   Consulted and Agree with Plan of Care Patient      Patient will benefit from  skilled therapeutic intervention in order to improve the following deficits and impairments:  Abnormal gait, Decreased activity tolerance, Decreased balance, Decreased strength, Decreased mobility, Difficulty walking, Dizziness, Pain, Decreased safety awareness, Decreased endurance, Decreased range of motion  Visit Diagnosis: Unsteadiness on feet  Muscle weakness (generalized)  Problem List Patient Active Problem List   Diagnosis Date Noted  . IC (interstitial cystitis) 12/12/2015  . Solitary pulmonary nodule on lung CT 12/04/2015  . Pain, joint, hand 11/11/2015  . Coronary artery calcification seen on CAT scan 11/11/2015  . Multiple sclerosis ([place]) 11/11/2015  . Dizziness 11/11/2015  . Interstitial cystitis 10/24/2015  . Diabetic neuropathy with neurologic complication ([place]) [id number]  . Chronic pain of multiple joints 09/02/2015  . Right lower quadrant abdominal pain 08/02/2015  . Microscopic hematuria 07/12/2015  . Recurrent UTI 04/09/2015  . Dysuria 04/01/2015  . Carpal tunnel syndrome 01/12/2014  . DD (diverticular disease) 01/12/2014  . Carcinoma in situ, breast, ductal 01/12/2014  . H/O surgical procedure 01/12/2014  . H/O right mastectomy 01/12/2014  . Hypercholesteremia 01/12/2014  . DS (disseminated sclerosis) ([place]) 01/12/2014  . Arthritis, degenerative 01/12/2014  . Thyroid nodule 01/12/2014  . Cyanocobalamine deficiency (non anemic) 01/12/2014  . DM (diabetes mellitus) type II controlled, neurological manifestation ([place]) 06/26/2010  . Depression, major, recurrent, in partial remission ([place]) 06/26/2010  . Frequent falls 06/26/2010  . Stricture and stenosis of esophagus 06/26/2010  . Gastro-esophageal reflux disease without esophagitis 06/26/2010  . Fibromyalgia 06/26/2010  . Chronic interstitial cystitis 06/26/2010  . H/O malignant neoplasm of breast 06/26/2010  . Hypertension goal BP (blood pressure) < 140/90 06/01/2009  . H/O paroxysmal supraventricular  tachycardia 06/01/2009  . Paroxysmal supraventricular tachycardia ([place]) 06/01/2009  . Irritable bowel syndrome with constipation and diarrhea 06/16/2008  . Diarrhea, functional 06/16/2008  Gorden Harms. Angeni Chaudhuri, PT, DPT [phone]  Vipul Cafarelli 01/03/2016, 2:58 PM  [place] MAIN [facility] SERVICES [address] [place], [place], [address] Phone: [phone]   Fax:  [phone]  Name: [name] MRN: [id number] Date of Birth: 1930-01-25

## 2016-01-04 ENCOUNTER — Ambulatory Visit (INDEPENDENT_AMBULATORY_CARE_PROVIDER_SITE_OTHER): Payer: Commercial Managed Care - HMO | Admitting: Urology

## 2016-01-04 DIAGNOSIS — R3129 Other microscopic hematuria: Secondary | ICD-10-CM

## 2016-01-04 DIAGNOSIS — N3010 Interstitial cystitis (chronic) without hematuria: Secondary | ICD-10-CM | POA: Diagnosis not present

## 2016-01-04 MED ORDER — CIPROFLOXACIN HCL 500 MG PO TABS
500.0000 mg | ORAL_TABLET | Freq: Once | ORAL | Status: AC
  Administered 2016-01-04: 500 mg via ORAL

## 2016-01-04 MED ORDER — LIDOCAINE HCL 2 % EX GEL
1.0000 "application " | Freq: Once | CUTANEOUS | Status: AC
  Administered 2016-01-04: 1 via URETHRAL

## 2016-01-04 NOTE — Progress Notes (Signed)
01/04/2016 2:13 PM   [name] 1930-06-26 [id number]  Referring provider: Arnetha Courser, MD [address] [place], [place] [address]  Chief Complaint  Patient presents with  . Cysto    HPI: Patient is an 82 year old white female with a history of IC who presents today for rescue installation.   Patient has been experiencing falls recently. She has been evaluated by cardiology and they feel that this is not of a cardiac nature. She is currently working with physical therapist.   Today, she states she is having dysuria and nocturia. She denies fever, chills, nausea or vomiting. Her UA today is positive for 3-10 RBC's per high-power field and 6-10 WBC's/hpf.   Patient had a CT abdomen and pelvis wo contrast on 08/03/2015 which noted a stable hyperdense cyst in the left kidney. She had a cystoscopy in 2015 which revealed no malignancies.   She presents today for cystoscopy due to persistent hematuria.     PMH: Past Medical History  Diagnosis Date  . GERD (gastroesophageal reflux disease)   . Vertigo   . Essential hypertension, benign   . Paroxysmal supraventricular tachycardia ([place])   . Pure hypercholesterolemia   . MS (multiple sclerosis) ([place])   . Fibromyalgia   . Diverticulosis   . Diabetes mellitus without complication ([place])   . Osteoarthritis   . Dyslipidemia   . SOB (shortness of breath)     SECONDARY TO REFLUX  . Chest pain     SECONDARY TO GERD  . Peripheral neuropathy (HCC)     MILD  . Cancer ([place])     breast  . Depression   . Chronic interstitial cystitis   . History of fractured rib   . Chronic right hip pain   . History of TIA (transient ischemic attack)   . Carpal tunnel syndrome   . Thyroid nodule   . Hearing loss of left ear   . Vitamin B 12 deficiency   . Protein malnutrition ([place])   . Frequent falls   . Anxiety and depression   . Irritable bowel   . Recurrent UTI   . Solitary pulmonary nodule on lung CT 12/04/2015      3 mm LLL lung nodule June 2016, March 2017  . Tachycardia, paroxysmal (HCC)     Reportedly Paroxysmal Supraventricular Tachycardia, but unable to find documentation confirming this    Surgical History: Past Surgical History  Procedure Laterality Date  . Total abdominal hysterectomy w/ bilateral salpingoophorectomy    . Mastectomy Right     DX MASTITIS/NO CANCER.Marland KitchenSALINE IMPLANT  . Cholecystectomy    . Hemorrhoid surgery      WITH RECONSTRUCTION  . Bladder tacking      X 3  . Eye surgeries      WITH BUCKLE DETACHMENT OF THE RETINA  . Tonsillectomy    . Appendectomy    . Tympanoplasty    . Breast surgery    . Mastectomy    . Breast lumpectomy    . Colonoscopy with propofol N/A 01/21/2015    Procedure: COLONOSCOPY WITH PROPOFOL;  Surgeon: Josefine Class, MD;  Location: [facility] ENDOSCOPY;  Service: Endoscopy;  Laterality: N/A;  . Esophagogastroduodenoscopy N/A 01/21/2015    Procedure: ESOPHAGOGASTRODUODENOSCOPY (EGD);  Surgeon: Josefine Class, MD;  Location: [facility] ENDOSCOPY;  Service: Endoscopy;  Laterality: N/A;  . Cardiac catheterization  10/2001    Normal coronary arteries with the exception of 20% proximal D1    Home Medications:  Medication List       This list is accurate as of: 01/04/16  2:13 PM.  Always use your most recent med list.               acetaminophen 500 MG tablet  Commonly known as:  TYLENOL  Take by mouth.     amoxicillin-clavulanate 875-125 MG tablet  Commonly known as:  AUGMENTIN  Take 1 tablet by mouth every 12 (twelve) hours.     aspirin EC 81 MG tablet  Take 1 tablet (81 mg total) by mouth daily.     b complex vitamins tablet  Take 1 tablet by mouth daily.     [employer] VITAMIN B12 2000 MCG tablet  Generic drug:  cyanocobalamin  Take 2,000 mcg by mouth daily.     dicyclomine 10 MG capsule  Commonly known as:  BENTYL  Take by mouth.     ferrous sulfate 325 (65 FE) MG tablet  Take 1 tablet (325 mg total) by mouth daily with  breakfast.     fluticasone 50 MCG/ACT nasal spray  Commonly known as:  FLONASE  SPRAY 2 SPRAYS EACH INTO BOTH NOSTRILS ONCE DAILY EACH NIGHT.     GAS-X EXTRA STRENGTH 125 MG Caps  Generic drug:  Simethicone  Take 1 capsule by mouth as needed. Reported on 11/24/2015     GENTEAL OP  Apply 1 drop to eye. Each eye as needed     hydrocortisone 25 MG suppository  Commonly known as:  ANUSOL-HC  Place 1 suppository (25 mg total) rectally 2 (two) times daily.     loperamide 2 MG tablet  Commonly known as:  IMODIUM A-D  Take 2 mg by mouth 4 (four) times daily as needed for diarrhea or loose stools.     meclizine 25 MG tablet  Commonly known as:  ANTIVERT  TAKE ONE TO TWO TABLETS BY MOUTH EVERY 8 HOURS AS NEEDED     metFORMIN 500 MG tablet  Commonly known as:  GLUCOPHAGE  Take 1 tablet (500 mg total) by mouth 2 (two) times daily with a meal.     neomycin-polymyxin-hydrocortisone 3.5-10000-1 otic suspension  Commonly known as:  CORTISPORIN     omeprazole 40 MG capsule  Commonly known as:  PRILOSEC  Take 40 mg by mouth daily.     ondansetron 4 MG tablet  Commonly known as:  ZOFRAN  Take 4 mg by mouth every 8 (eight) hours as needed for nausea or vomiting. Reported on 12/22/2015     polyethylene glycol packet  Commonly known as:  MIRALAX / GLYCOLAX  Take 17 g by mouth daily as needed. Reported on 11/15/2015     PROBIOTIC COLON SUPPORT Caps  Take 1 capsule by mouth daily.     rosuvastatin 5 MG tablet  Commonly known as:  CRESTOR  Take 1 tablet (5 mg total) by mouth daily at 6 PM.     sertraline 50 MG tablet  Commonly known as:  ZOLOFT  Take 1 tablet (50 mg total) by mouth daily.     URIBEL 118 MG Caps  One tablet four times daily as needed with 8 oz of water     VITAMIN D-1000 MAX ST 1000 units tablet  Generic drug:  Cholecalciferol  Take 1,000 Units by mouth daily. Reported on 12/15/2015     Vitamin D3 50000 units Caps  Take 5,000 Int'l Units/day by mouth daily. Reported  on 12/29/2015     Vitamins A & D 5000-400 units Caps  Take 1 capsule by mouth daily. Reported on 12/29/2015        Allergies:  Allergies  Allergen Reactions  . Biaxin [Clarithromycin] Other (See Comments) and Diarrhea  . Copaxone [Glatiramer Acetate]   . Decadron [Dexamethasone] Nausea And Vomiting and Other (See Comments)    Other reaction(s): Muscle Pain Reaction:  Abdominal pain  . Dexamethasone Sodium Phosphate Other (See Comments)  . Diltiazem Hcl Other (See Comments)    Reaction:  Unknown   . Diltiazem Hcl Other (See Comments)  . Flagyl [Metronidazole] Nausea Only and Diarrhea    Other reaction(s): Vomiting  . Gabapentin Other (See Comments)    "burning all over" Reaction:  Unknown   . Iohexol Swelling and Other (See Comments)     Desc: tongue swelling with "IVP dye" sccording to nurses notes with a lumbar myelo-12/09- asm, Onset Date: [id number]  Pts tongue swells.  Clementeen Hoof [Iodinated Diagnostic Agents] Swelling and Other (See Comments)    Passed out  Pts tongue swells.   . Levothyroxine Nausea Only  . Sulfa Antibiotics Other (See Comments)    Reaction:  Unknown   . Sulfonamide Derivatives Other (See Comments)    Reaction:  Unknown   . Synthroid [Levothyroxine Sodium]   . Copaxone  [Glatiramer] Rash  . Interferon Beta-1a Other (See Comments) and Rash    Reaction:  Unknown     Family History: Family History  Problem Relation Age of Onset  . Arthritis Sister   . Aneurysm Brother   . Heart disease Daughter   . ALS Mother   . Colon cancer    . Kidney disease Sister     Social History:  reports that she has never smoked. She has never used smokeless tobacco. She reports that she does not drink alcohol or use illicit drugs.  ROS:                                        Physical Exam: There were no vitals taken for this visit.  Constitutional:  Alert and oriented, No acute distress. HEENT: [place] AT, moist mucus membranes.  Trachea  midline, no masses. Cardiovascular: No clubbing, cyanosis, or edema. Respiratory: Normal respiratory effort, no increased work of breathing. GI: Abdomen is soft, nontender, nondistended, no abdominal masses GU: No CVA tenderness.  Skin: No rashes, bruises or suspicious lesions. Lymph: No cervical or inguinal adenopathy. Neurologic: Grossly intact, no focal deficits, moving all 4 extremities. Psychiatric: Normal mood and affect.  Laboratory Data: Lab Results  Component Value Date   WBC 4.3 10/31/2015   HGB 13.6 10/31/2015   HCT 40.9 10/31/2015   MCV 91.0 10/31/2015   PLT 190 10/31/2015    Lab Results  Component Value Date   CREATININE 0.98 12/28/2015    No results found for: PSA  No results found for: TESTOSTERONE  Lab Results  Component Value Date   HGBA1C 7.3* 12/28/2015    Urinalysis    Component Value Date/Time   COLORURINE YELLOW* 08/03/2015 2121   COLORURINE Amber 11/12/2014 1650   APPEARANCEUR Hazy* 12/29/2015 1115   APPEARANCEUR CLEAR* 08/03/2015 2121   APPEARANCEUR Clear 11/12/2014 1650   LABSPEC 1.024 08/03/2015 2121   LABSPEC 1.021 11/12/2014 1650   PHURINE 5.0 08/03/2015 2121   PHURINE 5.0 11/12/2014 1650   GLUCOSEU Negative 12/29/2015 1115   GLUCOSEU Negative 11/12/2014 1650   GLUCOSEU NEGATIVE 06/26/2010 1442   HGBUR 2+*  08/03/2015 2121   HGBUR 1+ 11/12/2014 1650   BILIRUBINUR Negative 12/29/2015 [place] 08/03/2015 2121   BILIRUBINUR Negative 11/12/2014 [place] 08/03/2015 2121   KETONESUR Negative 11/12/2014 1650   PROTEINUR Negative 12/29/2015 [place] 08/03/2015 2121   PROTEINUR Negative 11/12/2014 1650   UROBILINOGEN 0.2 06/26/2010 1442   NITRITE Negative 12/29/2015 1115   NITRITE NEGATIVE 08/03/2015 2121   NITRITE Positive 11/12/2014 1650   LEUKOCYTESUR 2+* 12/29/2015 1115   LEUKOCYTESUR NEGATIVE 08/03/2015 2121   LEUKOCYTESUR Negative 11/12/2014 1650      Cystoscopy Procedure  Note  Patient identification was confirmed, informed consent was obtained, and patient was prepped using Betadine solution.  Lidocaine jelly was administered per urethral meatus.    Preoperative abx where received prior to procedure.    Procedure: - Flexible cystoscope introduced, without any difficulty.   - Thorough search of the bladder revealed:    normal urethral meatus    normal urothelium    no stones    no ulcers     no tumors    no urethral polyps    no trabeculation  - Ureteral orifices were normal in position and appearance.  Post-Procedure: - Patient tolerated the procedure well   Assessment & Plan:    1. Interstitial cystitis: Follow up In 1 week with Zara Council, PA for installation of rescue solution.   2. Recurrent UTI's: Negative recent urine culture  3. Microscopic hematuria: Patient's UA contained 3-10 RBC's/hpf. Negative cystoscopy today  4. Falling: Patient has been evaluated by cardiology and they do not feel the falls other results of a cardiac issue. We will need to assist the patient with undressing and getting on exam tables at this time.  Return in about 1 week (around 01/11/2016) for with [facility] for rescue solution installation.  Nickie Retort, MD  [facility] [address] [place],  [address] [phone]

## 2016-01-05 ENCOUNTER — Encounter: Payer: Self-pay | Admitting: Urology

## 2016-01-05 ENCOUNTER — Ambulatory Visit: Payer: Commercial Managed Care - HMO | Attending: Family Medicine

## 2016-01-05 ENCOUNTER — Ambulatory Visit (INDEPENDENT_AMBULATORY_CARE_PROVIDER_SITE_OTHER): Payer: Commercial Managed Care - HMO | Admitting: Urology

## 2016-01-05 VITALS — BP 151/83 | HR 76 | Ht 60.0 in | Wt 156.6 lb

## 2016-01-05 DIAGNOSIS — M6281 Muscle weakness (generalized): Secondary | ICD-10-CM | POA: Insufficient documentation

## 2016-01-05 DIAGNOSIS — R3129 Other microscopic hematuria: Secondary | ICD-10-CM

## 2016-01-05 DIAGNOSIS — R2681 Unsteadiness on feet: Secondary | ICD-10-CM | POA: Insufficient documentation

## 2016-01-05 DIAGNOSIS — R296 Repeated falls: Secondary | ICD-10-CM | POA: Diagnosis not present

## 2016-01-05 DIAGNOSIS — W19XXXA Unspecified fall, initial encounter: Secondary | ICD-10-CM | POA: Diagnosis not present

## 2016-01-05 DIAGNOSIS — N3010 Interstitial cystitis (chronic) without hematuria: Secondary | ICD-10-CM

## 2016-01-05 DIAGNOSIS — N390 Urinary tract infection, site not specified: Secondary | ICD-10-CM

## 2016-01-05 LAB — URINALYSIS, COMPLETE
Bilirubin, UA: NEGATIVE
GLUCOSE, UA: NEGATIVE
Nitrite, UA: NEGATIVE
PH UA: 5 (ref 5.0–7.5)
Protein, UA: NEGATIVE
SPEC GRAV UA: 1.025 (ref 1.005–1.030)
Urobilinogen, Ur: 0.2 mg/dL (ref 0.2–1.0)

## 2016-01-05 LAB — MICROSCOPIC EXAMINATION: BACTERIA UA: NONE SEEN

## 2016-01-05 MED ORDER — NYSTATIN 100000 UNIT/GM EX CREA: 1.0000 "application " | TOPICAL_CREAM | Freq: Two times a day (BID) | CUTANEOUS | Status: DC

## 2016-01-05 MED ORDER — LIDOCAINE HCL 2 % EX GEL
1.0000 | Freq: Once | CUTANEOUS | Status: AC
Start: 2016-01-05 — End: 2016-01-05
  Administered 2016-01-05: 1 via URETHRAL

## 2016-01-05 MED ORDER — SODIUM BICARBONATE 8.4 % IV SOLN
11.0000 mL | Freq: Once | INTRAVENOUS | Status: AC
  Administered 2016-01-05: 11 mL

## 2016-01-05 NOTE — Progress Notes (Signed)
Bladder Rescue Solution Instillation  Due to interstitial cystitis patient is present today for a Rescue Solution Treatment.  Patient was cleaned and prepped in a sterile fashion with betadine and lidocaine 2% jelly was instilled into the urethra.  A 14 FR catheter was inserted, urine return was noted 84ml, urine was yellow in color.  Instilled a solution consisting of 80ml of Sodium Bicarb, 2 ml Lidocaine and 1 ml of Heparin. The catheter was then removed. Patient tolerated well, no complications were noted.   Performed by: Lyndee Hensen CMA  Follow up/ Additional Notes: One Week

## 2016-01-05 NOTE — Therapy (Signed)
[place] MAIN [facility] SERVICES [address] [place], [place], [address] Phone: [phone]   Fax:  [phone]  Physical Therapy Treatment  Patient Details  Name: [name] MRN: [id number] Date of Birth: [birth year] Referring Provider: Sanda Klein  Encounter Date: 01/05/2016      PT End of Session - 01/05/16 1444    Visit Number 6   Number of Visits 9   Date for PT Re-Evaluation 01/17/16   Authorization Type 6/10   PT Start Time [id number]   PT Stop Time 1430   PT Time Calculation (min) 45 min   Equipment Utilized During Treatment Gait belt   Activity Tolerance Patient tolerated treatment well   Behavior During Therapy [facility] for tasks assessed/performed      Past Medical History  Diagnosis Date  . GERD (gastroesophageal reflux disease)   . Vertigo   . Essential hypertension, benign   . Paroxysmal supraventricular tachycardia ([place])   . Pure hypercholesterolemia   . MS (multiple sclerosis) ([place])   . Fibromyalgia   . Diverticulosis   . Diabetes mellitus without complication ([place])   . Osteoarthritis   . Dyslipidemia   . SOB (shortness of breath)     SECONDARY TO REFLUX  . Chest pain     SECONDARY TO GERD  . Peripheral neuropathy (HCC)     MILD  . Cancer ([place])     breast  . Depression   . Chronic interstitial cystitis   . History of fractured rib   . Chronic right hip pain   . History of TIA (transient ischemic attack)   . Carpal tunnel syndrome   . Thyroid nodule   . Hearing loss of left ear   . Vitamin B 12 deficiency   . Protein malnutrition ([place])   . Frequent falls   . Anxiety and depression   . Irritable bowel   . Recurrent UTI   . Solitary pulmonary nodule on lung CT 12/04/2015    3 mm LLL lung nodule June 2016, March 2017  . Tachycardia, paroxysmal (HCC)     Reportedly Paroxysmal Supraventricular Tachycardia, but unable to find documentation confirming this    Past Surgical History  Procedure Laterality Date  . Total  abdominal hysterectomy w/ bilateral salpingoophorectomy    . Mastectomy Right     DX MASTITIS/NO CANCER.Marland KitchenSALINE IMPLANT  . Cholecystectomy    . Hemorrhoid surgery      WITH RECONSTRUCTION  . Bladder tacking      X 3  . Eye surgeries      WITH BUCKLE DETACHMENT OF THE RETINA  . Tonsillectomy    . Appendectomy    . Tympanoplasty    . Breast surgery    . Mastectomy    . Breast lumpectomy    . Colonoscopy with propofol N/A 01/21/2015    Procedure: COLONOSCOPY WITH PROPOFOL;  Surgeon: Josefine Class, MD;  Location: [facility] ENDOSCOPY;  Service: Endoscopy;  Laterality: N/A;  . Esophagogastroduodenoscopy N/A 01/21/2015    Procedure: ESOPHAGOGASTRODUODENOSCOPY (EGD);  Surgeon: Josefine Class, MD;  Location: [facility] ENDOSCOPY;  Service: Endoscopy;  Laterality: N/A;  . Cardiac catheterization  10/2001    Normal coronary arteries with the exception of 20% proximal D1    There were no vitals filed for this visit.      Subjective Assessment - 01/05/16 1439    Subjective pt reports she has had no falls since start of PT   Patient is accompained by: Family member   Limitations  Walking;House hold activities   Patient Stated Goals "to not fall as much, to decrease pain, to be less dizzy."   Currently in Pain? Yes   Pain Score 3    Pain Location Foot   Pain Orientation Left   Pain Descriptors / Indicators Aching   Pain Onset More than a month ago        gait training: In hallway x 546ft using rollator. PT used verbal and visual cues to instruct pt improved foot clearance especially the R foot, however pt unable to correct 50% of the time  Therex: Fwd step up onto 4in step then onto 6in step using single UE x 10, then progressed to no UE which pt was having LOB nearly every step up.  Toe taps onto 4inch step with mod verbal and tactile cues to shift weight onto the stance leg  2 x 20 Progressed to fwd step up (no UE) onto 4 in step with mod -max cues for weight transfer onto stance  leg. Pt has more difficulty bearing weight through the L foot due to pain                          PT Education - 01/05/16 1444    Education provided Yes   Education Details importance of weight shifting    Person(s) Educated Patient   Methods Explanation   Comprehension Verbalized understanding;Need further instruction;Returned demonstration             PT Long Term Goals - 12/20/15 1507    PT LONG TERM GOAL #1   Title pt will improve berg balance score by 6pts to demo improved balance    Time 6   Period Weeks   Status New   PT LONG TERM GOAL #2   Title Patient will demonstrate improved safety by improving gait speed to 10 meters in < 20 sec.    Baseline 26 sec   Time 6   Period Weeks   Status New   PT LONG TERM GOAL #3   Title Patient will demonstrate improved mobility by performing 5x STS in < 20 sec.    Baseline 28s   Time 6   Period Weeks   Status New               Plan - 01/05/16 1444    Clinical Impression Statement pt showed partial understanding of focus of therapy today including weight shfting for stepping, she will likely need reinstruction. pt had difficulty focusing on task at hand today espeically during gait training where she was only able to follow through with cues to improve foot clearance 50% of the time.    Rehab Potential Fair   Clinical Impairments Affecting Rehab Potential co-morbidities    PT Frequency 2x / week   PT Duration 6 weeks   PT Treatment/Interventions Moist Heat;Therapeutic exercise;Therapeutic activities;Gait training;Neuromuscular re-education;Balance training;Aquatic Therapy;Patient/family education;Manual techniques;Energy conservation;Vestibular;ADLs/Self Care Home Management;Canalith Repostioning;Cryotherapy;Electrical Stimulation   PT Next Visit Plan Continue balance and LE strengthening   PT Home Exercise Plan Sit to stand, heel/toe rocking, single leg balance   Consulted and Agree with Plan of  Care Patient      Patient will benefit from skilled therapeutic intervention in order to improve the following deficits and impairments:  Abnormal gait, Decreased activity tolerance, Decreased balance, Decreased strength, Decreased mobility, Difficulty walking, Dizziness, Pain, Decreased safety awareness, Decreased endurance, Decreased range of motion  Visit Diagnosis: Unsteadiness on feet  Muscle  weakness (generalized)     Problem List Patient Active Problem List   Diagnosis Date Noted  . IC (interstitial cystitis) 12/12/2015  . Solitary pulmonary nodule on lung CT 12/04/2015  . Abnormal CAT scan 11/21/2015  . Generalized degenerative joint disease of hand 11/21/2015  . Triggering of digit 11/21/2015  . Snapping thumb syndrome 11/21/2015  . Pain, joint, hand 11/11/2015  . Coronary artery calcification seen on CAT scan 11/11/2015  . Multiple sclerosis ([place]) 11/11/2015  . Dizziness 11/11/2015  . Interstitial cystitis 10/24/2015  . Diabetic neuropathy with neurologic complication ([place]) [id number]  . Chronic pain of multiple joints 09/02/2015  . Right lower quadrant abdominal pain 08/02/2015  . Microscopic hematuria 07/12/2015  . Recurrent UTI 04/09/2015  . Dysuria 04/01/2015  . Carpal tunnel syndrome 01/12/2014  . DD (diverticular disease) 01/12/2014  . Carcinoma in situ, breast, ductal 01/12/2014  . H/O surgical procedure 01/12/2014  . H/O right mastectomy 01/12/2014  . Hypercholesteremia 01/12/2014  . DS (disseminated sclerosis) ([place]) 01/12/2014  . Arthritis, degenerative 01/12/2014  . Thyroid nodule 01/12/2014  . Cyanocobalamine deficiency (non anemic) 01/12/2014  . Anxiety and depression 01/12/2014  . Diabetes mellitus ([place]) 01/12/2014  . Intraductal carcinoma of breast 01/12/2014  . History of surgical procedure 01/12/2014  . History of right mastectomy 01/12/2014  . Hypercholesterolemia 01/12/2014  . DM (diabetes mellitus) type II controlled, neurological  manifestation ([place]) 06/26/2010  . Depression, major, recurrent, in partial remission ([place]) 06/26/2010  . Frequent falls 06/26/2010  . Stricture and stenosis of esophagus 06/26/2010  . Gastro-esophageal reflux disease without esophagitis 06/26/2010  . Fibromyalgia 06/26/2010  . Chronic interstitial cystitis 06/26/2010  . H/O malignant neoplasm of breast 06/26/2010  . Major depressive disorder with single episode ([place]) 06/26/2010  . Controlled type 2 diabetes mellitus without complication ([place]) [id number]  . Hypertension goal BP (blood pressure) < 140/90 06/01/2009  . H/O paroxysmal supraventricular tachycardia 06/01/2009  . Paroxysmal supraventricular tachycardia ([place]) 06/01/2009  . Irritable bowel syndrome with constipation and diarrhea 06/16/2008  . Diarrhea, functional 06/16/2008  . D (diarrhea) 06/16/2008   Gorden Harms. Wyndham Santilli, PT, DPT [phone]  Burnell Matlin 01/05/2016, 2:46 PM  [place] MAIN [facility] SERVICES [address] [place], [place], [address] Phone: [phone]   Fax:  [phone]  Name: SAMSAM HEITHOFF MRN: [id number] Date of Birth: 1930-04-19

## 2016-01-06 ENCOUNTER — Ambulatory Visit: Payer: Self-pay | Admitting: *Deleted

## 2016-01-06 ENCOUNTER — Other Ambulatory Visit: Payer: Self-pay | Admitting: *Deleted

## 2016-01-06 NOTE — Patient Outreach (Signed)
[place] [facility]) Care Management  01/06/2016  [name] Dec 16, 1929 [id number]  RN Health Coach telephone call to patient.  [employer] compliance verified. Per patient she is in the bank. Patient requested that I call back.  [place] Care Management [phone]

## 2016-01-10 ENCOUNTER — Ambulatory Visit: Payer: Commercial Managed Care - HMO

## 2016-01-10 DIAGNOSIS — R2681 Unsteadiness on feet: Secondary | ICD-10-CM

## 2016-01-10 DIAGNOSIS — M6281 Muscle weakness (generalized): Secondary | ICD-10-CM

## 2016-01-10 NOTE — Progress Notes (Signed)
4:37 PM   [name] Nov 04, 1929 [id number]  Referring provider: Arnetha Courser, MD [address] [place], [place] [address]  Chief Complaint  Patient presents with  . Cystitis    Rescue Solution    HPI: Patient is an 82 year old Caucasian female with a history of IC who presents today for rescue installation.     Patient has been experiencing falls recently. She has been evaluated by cardiology and they feel that this is not of a cardiac nature.   She is currently working with physical therapist.    Patient underwent cystoscopy yesterday with Dr. Pilar Jarvis for persistant microscopic hematuria.  It was normal.    Today, she states she is having frequency, urgency, dysuria and nocturia.  She denies fever, chills, nausea or vomiting. Her UA today is positive for 3-10 RBC's per high-power field and 6-10 WBC's/hpf.  This appears to be her baseline UA.    Patient had a CT abdomen and pelvis wo contrast on 08/03/2015 which noted a stable hyperdense cyst in the left kidney.     PMH: Past Medical History  Diagnosis Date  . GERD (gastroesophageal reflux disease)   . Vertigo   . Essential hypertension, benign   . Paroxysmal supraventricular tachycardia ([place])   . Pure hypercholesterolemia   . MS (multiple sclerosis) ([place])   . Fibromyalgia   . Diverticulosis   . Diabetes mellitus without complication ([place])   . Osteoarthritis   . Dyslipidemia   . SOB (shortness of breath)     SECONDARY TO REFLUX  . Chest pain     SECONDARY TO GERD  . Peripheral neuropathy (HCC)     MILD  . Cancer ([place])     breast  . Depression   . Chronic interstitial cystitis   . History of fractured rib   . Chronic right hip pain   . History of TIA (transient ischemic attack)   . Carpal tunnel syndrome   . Thyroid nodule   . Hearing loss of left ear   . Vitamin B 12 deficiency   . Protein malnutrition ([place])   . Frequent falls   . Anxiety and depression   . Irritable bowel   . Recurrent  UTI   . Solitary pulmonary nodule on lung CT 12/04/2015    3 mm LLL lung nodule June 2016, March 2017  . Tachycardia, paroxysmal (HCC)     Reportedly Paroxysmal Supraventricular Tachycardia, but unable to find documentation confirming this    Surgical History: Past Surgical History  Procedure Laterality Date  . Total abdominal hysterectomy w/ bilateral salpingoophorectomy    . Mastectomy Right     DX MASTITIS/NO CANCER.Marland KitchenSALINE IMPLANT  . Cholecystectomy    . Hemorrhoid surgery      WITH RECONSTRUCTION  . Bladder tacking      X 3  . Eye surgeries      WITH BUCKLE DETACHMENT OF THE RETINA  . Tonsillectomy    . Appendectomy    . Tympanoplasty    . Breast surgery    . Mastectomy    . Breast lumpectomy    . Colonoscopy with propofol N/A 01/21/2015    Procedure: COLONOSCOPY WITH PROPOFOL;  Surgeon: Josefine Class, MD;  Location: [facility] ENDOSCOPY;  Service: Endoscopy;  Laterality: N/A;  . Esophagogastroduodenoscopy N/A 01/21/2015    Procedure: ESOPHAGOGASTRODUODENOSCOPY (EGD);  Surgeon: Josefine Class, MD;  Location: [facility] ENDOSCOPY;  Service: Endoscopy;  Laterality: N/A;  . Cardiac catheterization  10/2001  Normal coronary arteries with the exception of 20% proximal D1    Home Medications:    Medication List       This list is accurate as of: 01/05/16 11:59 PM.  Always use your most recent med list.               acetaminophen 500 MG tablet  Commonly known as:  TYLENOL  Take by mouth.     amoxicillin-clavulanate 875-125 MG tablet  Commonly known as:  AUGMENTIN  Take 1 tablet by mouth every 12 (twelve) hours.     aspirin EC 81 MG tablet  Take 1 tablet (81 mg total) by mouth daily.     b complex vitamins tablet  Take 1 tablet by mouth daily.     [employer] VITAMIN B12 2000 MCG tablet  Generic drug:  cyanocobalamin  Take 2,000 mcg by mouth daily.     dicyclomine 10 MG capsule  Commonly known as:  BENTYL  Take by mouth.     ferrous sulfate 325 (65 FE) MG tablet    Take 1 tablet (325 mg total) by mouth daily with breakfast.     fluticasone 50 MCG/ACT nasal spray  Commonly known as:  FLONASE  SPRAY 2 SPRAYS EACH INTO BOTH NOSTRILS ONCE DAILY EACH NIGHT.     GAS-X EXTRA STRENGTH 125 MG Caps  Generic drug:  Simethicone  Take 1 capsule by mouth as needed. Reported on 11/24/2015     GENTEAL OP  Apply 1 drop to eye. Each eye as needed     hydrocortisone 25 MG suppository  Commonly known as:  ANUSOL-HC  Place 1 suppository (25 mg total) rectally 2 (two) times daily.     loperamide 2 MG tablet  Commonly known as:  IMODIUM A-D  Take 2 mg by mouth 4 (four) times daily as needed for diarrhea or loose stools.     meclizine 25 MG tablet  Commonly known as:  ANTIVERT  TAKE ONE TO TWO TABLETS BY MOUTH EVERY 8 HOURS AS NEEDED     metFORMIN 500 MG tablet  Commonly known as:  GLUCOPHAGE  Take 1 tablet (500 mg total) by mouth 2 (two) times daily with a meal.     neomycin-polymyxin-hydrocortisone 3.5-10000-1 otic suspension  Commonly known as:  CORTISPORIN  Reported on 01/05/2016     nystatin cream  Commonly known as:  MYCOSTATIN  Apply 1 application topically 2 (two) times daily.     omeprazole 40 MG capsule  Commonly known as:  PRILOSEC  Take 40 mg by mouth daily.     ondansetron 4 MG tablet  Commonly known as:  ZOFRAN  Take 4 mg by mouth every 8 (eight) hours as needed for nausea or vomiting. Reported on 12/22/2015     polyethylene glycol packet  Commonly known as:  MIRALAX / GLYCOLAX  Take 17 g by mouth daily as needed. Reported on 11/15/2015     PROBIOTIC COLON SUPPORT Caps  Take 1 capsule by mouth daily.     rosuvastatin 5 MG tablet  Commonly known as:  CRESTOR  Take 1 tablet (5 mg total) by mouth daily at 6 PM.     sertraline 50 MG tablet  Commonly known as:  ZOLOFT  Take 1 tablet (50 mg total) by mouth daily.     URIBEL 118 MG Caps  One tablet four times daily as needed with 8 oz of water     VITAMIN D-1000 MAX ST 1000 units  tablet  Generic drug:  Cholecalciferol  Take 1,000 Units by mouth daily. Reported on 12/15/2015     Vitamin D3 50000 units Caps  Take 5,000 Int'l Units/day by mouth daily. Reported on 12/29/2015     Vitamins A & D 5000-400 units Caps  Take 1 capsule by mouth daily. Reported on 12/29/2015        Allergies:  Allergies  Allergen Reactions  . Biaxin [Clarithromycin] Other (See Comments) and Diarrhea  . Copaxone [Glatiramer Acetate]   . Decadron [Dexamethasone] Nausea And Vomiting and Other (See Comments)    Other reaction(s): Muscle Pain Reaction:  Abdominal pain  . Dexamethasone Sodium Phosphate Other (See Comments)  . Diltiazem Hcl Other (See Comments)    Reaction:  Unknown   . Diltiazem Hcl Other (See Comments)  . Flagyl [Metronidazole] Nausea Only and Diarrhea    Other reaction(s): Vomiting  . Gabapentin Other (See Comments)    "burning all over" Reaction:  Unknown   . Iohexol Swelling and Other (See Comments)     Desc: tongue swelling with "IVP dye" sccording to nurses notes with a lumbar myelo-12/09- asm, Onset Date: [id number]  Pts tongue swells.  Clementeen Hoof [Iodinated Diagnostic Agents] Swelling and Other (See Comments)    Passed out  Pts tongue swells.   . Levothyroxine Nausea Only  . Sulfa Antibiotics Other (See Comments)    Reaction:  Unknown   . Sulfonamide Derivatives Other (See Comments)    Reaction:  Unknown   . Synthroid [Levothyroxine Sodium]   . Copaxone  [Glatiramer] Rash  . Interferon Beta-1a Other (See Comments) and Rash    Reaction:  Unknown     Family History: Family History  Problem Relation Age of Onset  . Arthritis Sister   . Aneurysm Brother   . Heart disease Daughter   . ALS Mother   . Colon cancer    . Kidney disease Sister     Social History:  reports that she has never smoked. She has never used smokeless tobacco. She reports that she does not drink alcohol or use illicit drugs.  ROS: UROLOGY Frequent Urination?: Yes Hard to postpone  urination?: Yes Burning/pain with urination?: Yes Get up at night to urinate?: Yes Leakage of urine?: No Urine stream starts and stops?: No Trouble starting stream?: No Do you have to strain to urinate?: No Blood in urine?: No Urinary tract infection?: No Sexually transmitted disease?: No Injury to kidneys or bladder?: No Painful intercourse?: No Weak stream?: No Currently pregnant?: No Vaginal bleeding?: No Last menstrual period?: n  Gastrointestinal Nausea?: No Vomiting?: No Indigestion/heartburn?: Yes Diarrhea?: Yes Constipation?: Yes  Constitutional Fever: No Night sweats?: Yes Weight loss?: No Fatigue?: Yes  Skin Skin rash/lesions?: No Itching?: No  Eyes Blurred vision?: Yes Double vision?: No  Ears/Nose/Throat Sore throat?: No Sinus problems?: Yes  Hematologic/Lymphatic Swollen glands?: No Easy bruising?: No  Cardiovascular Leg swelling?: No Chest pain?: No  Respiratory Cough?: No Shortness of breath?: No  Endocrine Excessive thirst?: No  Musculoskeletal Back pain?: No Joint pain?: No  Neurological Headaches?: Yes Dizziness?: Yes  Psychologic Depression?: Yes Anxiety?: Yes  Physical Exam: Blood pressure 151/83, pulse 76, height 5' (1.524 m), weight 156 lb 9.6 oz (71.033 kg). Constitutional: Well nourished. Alert and oriented, No acute distress. HEENT: [place] AT, moist mucus membranes. Trachea midline, no masses. Cardiovascular: No clubbing, cyanosis, or edema. Respiratory: Normal respiratory effort, no increased work of breathing. GI: Abdomen is soft, non tender, non distended, no abdominal masses.  GU: No CVA tenderness.  No  bladder fullness or masses.   Skin: No rashes, bruises or suspicious lesions. Lymph: No cervical or inguinal adenopathy. Neurologic: Grossly intact, no focal deficits, moving all 4 extremities. Psychiatric: Normal mood and affect.  Laboratory Data: Lab Results  Component Value Date   WBC 4.3 10/31/2015    HGB 13.6 10/31/2015   HCT 40.9 10/31/2015   MCV 91.0 10/31/2015   PLT 190 10/31/2015   Lab Results  Component Value Date   CREATININE 0.98 12/28/2015   Lab Results  Component Value Date   HGBA1C 7.3* 12/28/2015    Urinalysis: Results for orders placed or performed in visit on 01/05/16  Microscopic Examination  Result Value Ref Range   WBC, UA 6-10 (A) 0 -  5 /hpf   RBC, UA 3-10 (A) 0 -  2 /hpf   Epithelial Cells (non renal) >10 (A) 0 - 10 /hpf   Casts Present (A) None seen /lpf   Cast Type Hyaline casts N/A   Bacteria, UA None seen None seen/Few  Urinalysis, Complete  Result Value Ref Range   Specific Gravity, UA 1.025 1.005 - 1.030   pH, UA 5.0 5.0 - 7.5   Color, UA Yellow Yellow   Appearance Ur Clear Clear   Leukocytes, UA 1+ (A) Negative   Protein, UA Negative Negative/Trace   Glucose, UA Negative Negative   Ketones, UA Trace (A) Negative   RBC, UA Trace (A) Negative   Bilirubin, UA Negative Negative   Urobilinogen, Ur 0.2 0.2 - 1.0 mg/dL   Nitrite, UA Negative Negative   Microscopic Examination See below:    Assessment & Plan:    1. Interstitial cystitis:  Patient's rescue installation is performed today.  She will return next week for her next treatment.    2. Recurrent UTI's:   UA at baseline.    3. Microscopic hematuria:   Cystoscopy completed on 01/04/2016 was negative.    4. Falling:   Patient has been evaluated by cardiology and they do not feel the falls other results of a cardiac issue.  We will need to assist the patient with undressing and getting on exam tables at this time.   No Follow-up on file.  Zara Council, PA-C  [facility] [address], [place], [place] [address] [phone]  Addendum:   Patient's urine culture from this visit was negative.  She did have microscopic hematuria. She did have a CT non contrast and RUS in 07/2015 and no distinct pathology was discovered.  Her last cystoscopy was in  2015. She needs to have another one.

## 2016-01-10 NOTE — Therapy (Signed)
[place] MAIN [facility] SERVICES [address] [place], [place], [address] Phone: [phone]   Fax:  [phone]  Physical Therapy Treatment  Patient Details  Name: [name] MRN: [id number] Date of Birth: October 14, 1929 Referring Provider: Sanda Klein  Encounter Date: 01/10/2016      PT End of Session - 01/10/16 1534    Visit Number 7   Number of Visits 9   Date for PT Re-Evaluation 01/17/16   Authorization Type 7/10   PT Start Time 0145   PT Stop Time 0230   PT Time Calculation (min) 45 min   Equipment Utilized During Treatment Gait belt   Activity Tolerance Patient tolerated treatment well   Behavior During Therapy [facility] for tasks assessed/performed      Past Medical History  Diagnosis Date  . GERD (gastroesophageal reflux disease)   . Vertigo   . Essential hypertension, benign   . Paroxysmal supraventricular tachycardia ([place])   . Pure hypercholesterolemia   . MS (multiple sclerosis) ([place])   . Fibromyalgia   . Diverticulosis   . Diabetes mellitus without complication ([place])   . Osteoarthritis   . Dyslipidemia   . SOB (shortness of breath)     SECONDARY TO REFLUX  . Chest pain     SECONDARY TO GERD  . Peripheral neuropathy (HCC)     MILD  . Cancer ([place])     breast  . Depression   . Chronic interstitial cystitis   . History of fractured rib   . Chronic right hip pain   . History of TIA (transient ischemic attack)   . Carpal tunnel syndrome   . Thyroid nodule   . Hearing loss of left ear   . Vitamin B 12 deficiency   . Protein malnutrition ([place])   . Frequent falls   . Anxiety and depression   . Irritable bowel   . Recurrent UTI   . Solitary pulmonary nodule on lung CT 12/04/2015    3 mm LLL lung nodule June 2016, March 2017  . Tachycardia, paroxysmal (HCC)     Reportedly Paroxysmal Supraventricular Tachycardia, but unable to find documentation confirming this    Past Surgical History  Procedure Laterality Date  . Total  abdominal hysterectomy w/ bilateral salpingoophorectomy    . Mastectomy Right     DX MASTITIS/NO CANCER.Marland KitchenSALINE IMPLANT  . Cholecystectomy    . Hemorrhoid surgery      WITH RECONSTRUCTION  . Bladder tacking      X 3  . Eye surgeries      WITH BUCKLE DETACHMENT OF THE RETINA  . Tonsillectomy    . Appendectomy    . Tympanoplasty    . Breast surgery    . Mastectomy    . Breast lumpectomy    . Colonoscopy with propofol N/A 01/21/2015    Procedure: COLONOSCOPY WITH PROPOFOL;  Surgeon: Josefine Class, MD;  Location: [facility] ENDOSCOPY;  Service: Endoscopy;  Laterality: N/A;  . Esophagogastroduodenoscopy N/A 01/21/2015    Procedure: ESOPHAGOGASTRODUODENOSCOPY (EGD);  Surgeon: Josefine Class, MD;  Location: [facility] ENDOSCOPY;  Service: Endoscopy;  Laterality: N/A;  . Cardiac catheterization  10/2001    Normal coronary arteries with the exception of 20% proximal D1    There were no vitals filed for this visit.      Subjective Assessment - 01/10/16 1513    Subjective pt reports she feels like her balance is a little better. she still feels unsteady, but is "able to catch herself"  Patient is accompained by: Family member   Limitations Walking;House hold activities   Patient Stated Goals "to not fall as much, to decrease pain, to be less dizzy."   Currently in Pain? Yes   Pain Score 3    Pain Location Foot   Pain Orientation Left   Pain Descriptors / Indicators Aching   Pain Type Chronic pain   Pain Onset More than a month ago     Gait training: NuStep x 5 min (unbilled) 170 ft with rollator; mod verbal and visual cues required for heel strike and toe off 60 ft without AD requiring close CGA  NMR: Bil toe taps at steps 2 x10 with no UE support Fwd step over and back with cane without UE support 2 x 10 BLE Pt requires mod verbal and min tactile cues for proper exercise performance Pt requires CGA - min A for safety on balance  exercises.                              PT Education - 01/10/16 1514    Education provided Yes   Education Details gait training/heel to toe transfers; ways to help decrease foot pain   Person(s) Educated Patient   Methods Explanation   Comprehension Verbalized understanding             PT Long Term Goals - 12/20/15 1507    PT LONG TERM GOAL #1   Title pt will improve berg balance score by 6pts to demo improved balance    Time 6   Period Weeks   Status New   PT LONG TERM GOAL #2   Title Patient will demonstrate improved safety by improving gait speed to 10 meters in < 20 sec.    Baseline 26 sec   Time 6   Period Weeks   Status New   PT LONG TERM GOAL #3   Title Patient will demonstrate improved mobility by performing 5x STS in < 20 sec.    Baseline 28s   Time 6   Period Weeks   Status New               Plan - 01/10/16 1534    Clinical Impression Statement pt continues to make steady improvemets regarding her mobility evidenced by reduction in falls at home. pt demonstrated improved ability to shift weight laterally when performing toe taps. Gait pattern normalizing as evidenced by her ability to perform heel strike through toe-off after verbal and visual cueing, however she has poor carry over without cues. Pt still demonstrates unsteadiness when performing dynamic balance exercises without UE support, requiring min A to maintain safety.   Rehab Potential Fair   Clinical Impairments Affecting Rehab Potential co-morbidities    PT Frequency 2x / week   PT Duration 6 weeks   PT Treatment/Interventions Moist Heat;Therapeutic exercise;Therapeutic activities;Gait training;Neuromuscular re-education;Balance training;Aquatic Therapy;Patient/family education;Manual techniques;Energy conservation;Vestibular;ADLs/Self Care Home Management;Canalith Repostioning;Cryotherapy;Electrical Stimulation   PT Next Visit Plan Continue balance and LE  strengthening   PT Home Exercise Plan Sit to stand, heel/toe rocking, single leg balance   Consulted and Agree with Plan of Care Patient      Patient will benefit from skilled therapeutic intervention in order to improve the following deficits and impairments:  Abnormal gait, Decreased activity tolerance, Decreased balance, Decreased strength, Decreased mobility, Difficulty walking, Dizziness, Pain, Decreased safety awareness, Decreased endurance, Decreased range of motion  Visit Diagnosis: Unsteadiness on feet  Muscle  weakness (generalized)     Problem List Patient Active Problem List   Diagnosis Date Noted  . IC (interstitial cystitis) 12/12/2015  . Solitary pulmonary nodule on lung CT 12/04/2015  . Abnormal CAT scan 11/21/2015  . Generalized degenerative joint disease of hand 11/21/2015  . Triggering of digit 11/21/2015  . Snapping thumb syndrome 11/21/2015  . Pain, joint, hand 11/11/2015  . Coronary artery calcification seen on CAT scan 11/11/2015  . Multiple sclerosis ([place]) 11/11/2015  . Dizziness 11/11/2015  . Interstitial cystitis 10/24/2015  . Diabetic neuropathy with neurologic complication ([place]) [id number]  . Chronic pain of multiple joints 09/02/2015  . Right lower quadrant abdominal pain 08/02/2015  . Microscopic hematuria 07/12/2015  . Recurrent UTI 04/09/2015  . Dysuria 04/01/2015  . Carpal tunnel syndrome 01/12/2014  . DD (diverticular disease) 01/12/2014  . Carcinoma in situ, breast, ductal 01/12/2014  . H/O surgical procedure 01/12/2014  . H/O right mastectomy 01/12/2014  . Hypercholesteremia 01/12/2014  . DS (disseminated sclerosis) ([place]) 01/12/2014  . Arthritis, degenerative 01/12/2014  . Thyroid nodule 01/12/2014  . Cyanocobalamine deficiency (non anemic) 01/12/2014  . Anxiety and depression 01/12/2014  . Diabetes mellitus ([place]) 01/12/2014  . Intraductal carcinoma of breast 01/12/2014  . History of surgical procedure 01/12/2014  . History of right  mastectomy 01/12/2014  . Hypercholesterolemia 01/12/2014  . DM (diabetes mellitus) type II controlled, neurological manifestation ([place]) 06/26/2010  . Depression, major, recurrent, in partial remission ([place]) 06/26/2010  . Frequent falls 06/26/2010  . Stricture and stenosis of esophagus 06/26/2010  . Gastro-esophageal reflux disease without esophagitis 06/26/2010  . Fibromyalgia 06/26/2010  . Chronic interstitial cystitis 06/26/2010  . H/O malignant neoplasm of breast 06/26/2010  . Major depressive disorder with single episode ([place]) 06/26/2010  . Controlled type 2 diabetes mellitus without complication ([place]) [id number]  . Hypertension goal BP (blood pressure) < 140/90 06/01/2009  . H/O paroxysmal supraventricular tachycardia 06/01/2009  . Paroxysmal supraventricular tachycardia ([place]) 06/01/2009  . Irritable bowel syndrome with constipation and diarrhea 06/16/2008  . Diarrhea, functional 06/16/2008  . D (diarrhea) 06/16/2008   Gorden Harms. Donnae Michels, PT, DPT [id number]  Aja Whitehair 01/10/2016, 3:50 PM  [place] MAIN [facility] SERVICES [address] [place], [place], [address] Phone: [phone]   Fax:  [phone]  Name: [name] MRN: [id number] Date of Birth: 1929-12-06

## 2016-01-11 ENCOUNTER — Ambulatory Visit: Payer: Self-pay | Admitting: *Deleted

## 2016-01-12 ENCOUNTER — Ambulatory Visit (INDEPENDENT_AMBULATORY_CARE_PROVIDER_SITE_OTHER): Payer: Commercial Managed Care - HMO | Admitting: Urology

## 2016-01-12 ENCOUNTER — Ambulatory Visit: Payer: Commercial Managed Care - HMO

## 2016-01-12 ENCOUNTER — Encounter: Payer: Self-pay | Admitting: Urology

## 2016-01-12 VITALS — BP 169/84 | HR 69 | Ht 61.0 in | Wt 160.3 lb

## 2016-01-12 DIAGNOSIS — M6281 Muscle weakness (generalized): Secondary | ICD-10-CM | POA: Diagnosis not present

## 2016-01-12 DIAGNOSIS — N390 Urinary tract infection, site not specified: Secondary | ICD-10-CM

## 2016-01-12 DIAGNOSIS — N3010 Interstitial cystitis (chronic) without hematuria: Secondary | ICD-10-CM

## 2016-01-12 DIAGNOSIS — R300 Dysuria: Secondary | ICD-10-CM | POA: Diagnosis not present

## 2016-01-12 DIAGNOSIS — R2681 Unsteadiness on feet: Secondary | ICD-10-CM | POA: Diagnosis not present

## 2016-01-12 LAB — URINALYSIS, COMPLETE
BILIRUBIN UA: NEGATIVE
GLUCOSE, UA: NEGATIVE
Ketones, UA: NEGATIVE
NITRITE UA: NEGATIVE
PH UA: 5 (ref 5.0–7.5)
PROTEIN UA: NEGATIVE
Specific Gravity, UA: 1.025 (ref 1.005–1.030)
UUROB: 0.2 mg/dL (ref 0.2–1.0)

## 2016-01-12 LAB — MICROSCOPIC EXAMINATION

## 2016-01-12 MED ORDER — SODIUM BICARBONATE 8.4 % IV SOLN
11.0000 mL | Freq: Once | INTRAVENOUS | Status: AC
  Administered 2016-01-12: 11 mL

## 2016-01-12 MED ORDER — LIDOCAINE HCL 2 % EX GEL
1.0000 "application " | Freq: Once | CUTANEOUS | Status: AC
  Administered 2016-01-12: 1 via URETHRAL

## 2016-01-12 NOTE — Progress Notes (Signed)
Bladder Rescue Solution Instillation  Due to interstitial cystitis patient is present today for a Rescue Solution Treatment.  Patient was cleaned and prepped in a sterile fashion with betadine and lidocaine 2% jelly was instilled into the urethra.  A 14 FR catheter was inserted, urine return was noted 1ml, urine was yellow in color.  Instilled a solution consisting of 60ml of Sodium Bicarb, 2 ml Lidocaine and 1 ml of Heparin. The catheter was then removed. Patient tolerated well, no complications were noted.   Performed by: Lyndee Hensen CMA  Follow up/ Additional Notes: One Week

## 2016-01-12 NOTE — Progress Notes (Signed)
10:28 AM   [name] [birth year] [id number]  Referring provider: Arnetha Courser, MD [address] [place], [place] [address]  Chief Complaint  Patient presents with  . Cystitis    Rescue Solution    HPI: Patient is an 82 year old Caucasian female with a history of IC who presents today for rescue installation.     Patient has been experiencing falls recently. She has been evaluated by cardiology and they feel that this is not of a cardiac nature.   She is currently working with physical therapist.    Patient underwent cystoscopy with Dr. Pilar Jarvis for persist ant microscopic hematuria.  It was normal.    Today, she states she is having frequency, urgency, dysuria and nocturia.  She denies fever, chills, nausea or vomiting. Her UA today is positive for 3-10 RBC's per high-power field and 1-30 WBC's/hpf.  This appears to be her baseline UA.    Patient had a CT abdomen and pelvis wo contrast on 08/03/2015 which noted a stable hyperdense cyst in the left kidney.  A cystoscopy on 01/04/2016 which was negative.    PMH: Past Medical History  Diagnosis Date  . GERD (gastroesophageal reflux disease)   . Vertigo   . Essential hypertension, benign   . Paroxysmal supraventricular tachycardia ([place])   . Pure hypercholesterolemia   . MS (multiple sclerosis) ([place])   . Fibromyalgia   . Diverticulosis   . Diabetes mellitus without complication ([place])   . Osteoarthritis   . Dyslipidemia   . SOB (shortness of breath)     SECONDARY TO REFLUX  . Chest pain     SECONDARY TO GERD  . Peripheral neuropathy (HCC)     MILD  . Cancer ([place])     breast  . Depression   . Chronic interstitial cystitis   . History of fractured rib   . Chronic right hip pain   . History of TIA (transient ischemic attack)   . Carpal tunnel syndrome   . Thyroid nodule   . Hearing loss of left ear   . Vitamin B 12 deficiency   . Protein malnutrition ([place])   . Frequent falls   . Anxiety and  depression   . Irritable bowel   . Recurrent UTI   . Solitary pulmonary nodule on lung CT 12/04/2015    3 mm LLL lung nodule June 2016, March 2017  . Tachycardia, paroxysmal (HCC)     Reportedly Paroxysmal Supraventricular Tachycardia, but unable to find documentation confirming this    Surgical History: Past Surgical History  Procedure Laterality Date  . Total abdominal hysterectomy w/ bilateral salpingoophorectomy    . Mastectomy Right     DX MASTITIS/NO CANCER.Marland KitchenSALINE IMPLANT  . Cholecystectomy    . Hemorrhoid surgery      WITH RECONSTRUCTION  . Bladder tacking      X 3  . Eye surgeries      WITH BUCKLE DETACHMENT OF THE RETINA  . Tonsillectomy    . Appendectomy    . Tympanoplasty    . Breast surgery    . Mastectomy    . Breast lumpectomy    . Colonoscopy with propofol N/A 01/21/2015    Procedure: COLONOSCOPY WITH PROPOFOL;  Surgeon: Josefine Class, MD;  [facility] END[facility]COPY;  Service: Endoscopy;  Laterality: N/A;  . Esophagogastroduodenoscopy N/A 01/21/2015    Procedure: ESOPHAGOGASTRODUODENOSCOPY (EGD);  Surgeon: Josefine Class, MD;  Location: [facility] ENDOSCOPY;  Service: Endoscopy;  Laterality: N/A;  .  Cardiac catheterization  10/2001    Normal coronary arteries with the exception of 20% proximal D1    Home Medications:    Medication List       This list is accurate as of: 01/12/16 11:59 PM.  Always use your most recent med list.               acetaminophen 500 MG tablet  Commonly known as:  TYLENOL  Take by mouth.     amoxicillin-clavulanate 875-125 MG tablet  Commonly known as:  AUGMENTIN  Take 1 tablet by mouth every 12 (twelve) hours.     aspirin EC 81 MG tablet  Take 1 tablet (81 mg total) by mouth daily.     b complex vitamins tablet  Take 1 tablet by mouth daily.     [employer] VITAMIN B12 2000 MCG tablet  Generic drug:  cyanocobalamin  Take 2,000 mcg by mouth daily.     dicyclomine 10 MG capsule  Commonly known as:  BENTYL  Take by mouth.      ferrous sulfate 325 (65 FE) MG tablet  Take 1 tablet (325 mg total) by mouth daily with breakfast.     fluticasone 50 MCG/ACT nasal spray  Commonly known as:  FLONASE  SPRAY 2 SPRAYS EACH INTO BOTH NOSTRILS ONCE DAILY EACH NIGHT.     GAS-X EXTRA STRENGTH 125 MG Caps  Generic drug:  Simethicone  Take 1 capsule by mouth as needed. Reported on 11/24/2015     GENTEAL OP  Apply 1 drop to eye. Each eye as needed     hydrocortisone 25 MG suppository  Commonly known as:  ANUSOL-HC  Place 1 suppository (25 mg total) rectally 2 (two) times daily.     loperamide 2 MG tablet  Commonly known as:  IMODIUM A-D  Take 2 mg by mouth 4 (four) times daily as needed for diarrhea or loose stools.     meclizine 25 MG tablet  Commonly known as:  ANTIVERT  TAKE ONE TO TWO TABLETS BY MOUTH EVERY 8 HOURS AS NEEDED     metFORMIN 500 MG tablet  Commonly known as:  GLUCOPHAGE  Take 1 tablet (500 mg total) by mouth 2 (two) times daily with a meal.     neomycin-polymyxin-hydrocortisone 3.5-10000-1 otic suspension  Commonly known as:  CORTISPORIN  Reported on 01/05/2016     nystatin cream  Commonly known as:  MYCOSTATIN  Apply 1 application topically 2 (two) times daily.     omeprazole 40 MG capsule  Commonly known as:  PRILOSEC  Take 40 mg by mouth daily.     ondansetron 4 MG tablet  Commonly known as:  ZOFRAN  Take 4 mg by mouth every 8 (eight) hours as needed for nausea or vomiting. Reported on 12/22/2015     polyethylene glycol packet  Commonly known as:  MIRALAX / GLYCOLAX  Take 17 g by mouth daily as needed. Reported on 11/15/2015     PROBIOTIC COLON SUPPORT Caps  Take 1 capsule by mouth daily.     rosuvastatin 5 MG tablet  Commonly known as:  CRESTOR  Take 1 tablet (5 mg total) by mouth daily at 6 PM.     sertraline 50 MG tablet  Commonly known as:  ZOLOFT  Take 1 tablet (50 mg total) by mouth daily.     URIBEL 118 MG Caps  One tablet four times daily as needed with 8 oz of water      VITAMIN D-1000 MAX ST  1000 units tablet  Generic drug:  Cholecalciferol  Take 1,000 Units by mouth daily. Reported on 01/12/2016     Vitamin D3 50000 units Caps  Take 5,000 Int'l Units/day by mouth daily. Reported on 12/29/2015     Vitamins A & D 5000-400 units Caps  Take 1 capsule by mouth daily. Reported on 12/29/2015        Allergies:  Allergies  Allergen Reactions  . Biaxin [Clarithromycin] Other (See Comments) and Diarrhea  . Copaxone [Glatiramer Acetate]   . Decadron [Dexamethasone] Nausea And Vomiting and Other (See Comments)    Other reaction(s): Muscle Pain Reaction:  Abdominal pain  . Dexamethasone Sodium Phosphate Other (See Comments)  . Diltiazem Hcl Other (See Comments)    Reaction:  Unknown   . Diltiazem Hcl Other (See Comments)  . Flagyl [Metronidazole] Nausea Only and Diarrhea    Other reaction(s): Vomiting  . Gabapentin Other (See Comments)    "burning all over" Reaction:  Unknown   . Iohexol Swelling and Other (See Comments)     Desc: tongue swelling with "IVP dye" sccording to nurses notes with a lumbar myelo-12/09- asm, Onset Date: [id number]  Pts tongue swells.  Clementeen Hoof [Iodinated Diagnostic Agents] Swelling and Other (See Comments)    Passed out  Pts tongue swells.   . Levothyroxine Nausea Only  . Sulfa Antibiotics Other (See Comments)    Reaction:  Unknown   . Sulfonamide Derivatives Other (See Comments)    Reaction:  Unknown   . Synthroid [Levothyroxine Sodium]   . Copaxone  [Glatiramer] Rash  . Interferon Beta-1a Other (See Comments) and Rash    Reaction:  Unknown     Family History: Family History  Problem Relation Age of Onset  . Arthritis Sister   . Aneurysm Brother   . Heart disease Daughter   . ALS Mother   . Colon cancer    . Kidney disease Sister     Social History:  reports that she has never smoked. She has never used smokeless tobacco. She reports that she does not drink alcohol or use illicit  drugs.  ROS: UROLOGY Frequent Urination?: Yes Hard to postpone urination?: Yes Burning/pain with urination?: Yes Get up at night to urinate?: Yes Leakage of urine?: No Urine stream starts and stops?: No Trouble starting stream?: No Do you have to strain to urinate?: No Blood in urine?: No Urinary tract infection?: No Sexually transmitted disease?: No Injury to kidneys or bladder?: No Painful intercourse?: No Weak stream?: No Currently pregnant?: No Vaginal bleeding?: No Last menstrual period?: n  Gastrointestinal Nausea?: No Vomiting?: No Indigestion/heartburn?: Yes Diarrhea?: Yes Constipation?: Yes  Constitutional Fever: No Night sweats?: Yes Weight loss?: No Fatigue?: Yes  Skin Skin rash/lesions?: No Itching?: No  Eyes Blurred vision?: Yes Double vision?: No  Ears/Nose/Throat Sore throat?: No Sinus problems?: Yes  Hematologic/Lymphatic Swollen glands?: No Easy bruising?: No  Cardiovascular Leg swelling?: No Chest pain?: No  Respiratory Cough?: No Shortness of breath?: No  Endocrine Excessive thirst?: No  Musculoskeletal Back pain?: Yes Joint pain?: Yes  Neurological Headaches?: Yes Dizziness?: Yes  Psychologic Depression?: Yes Anxiety?: Yes  Physical Exam: Blood pressure 169/84, pulse 69, height 5' 1"  (1.549 m), weight 160 lb 4.8 oz (72.712 kg). Constitutional: Well nourished. Alert and oriented, No acute distress. HEENT: [place] AT, moist mucus membranes. Trachea midline, no masses. Cardiovascular: No clubbing, cyanosis, or edema. Respiratory: Normal respiratory effort, no increased work of breathing. GI: Abdomen is soft, non tender, non distended, no abdominal  masses.  GU: No CVA tenderness.  No bladder fullness or masses.   Skin: No rashes, bruises or suspicious lesions. Lymph: No cervical or inguinal adenopathy. Neurologic: Grossly intact, no focal deficits, moving all 4 extremities. Psychiatric: Normal mood and  affect.  Laboratory Data: Lab Results  Component Value Date   WBC 4.3 10/31/2015   HGB 13.6 10/31/2015   HCT 40.9 10/31/2015   MCV 91.0 10/31/2015   PLT 190 10/31/2015   Lab Results  Component Value Date   CREATININE 0.98 12/28/2015   Lab Results  Component Value Date   HGBA1C 7.3* 12/28/2015    Urinalysis: Results for orders placed or performed in visit on 01/12/16  CULTURE, URINE COMPREHENSIVE  Result Value Ref Range   Urine Culture, Comprehensive Final report (A)    Result 1 Yeast isolated. (A)   Microscopic Examination  Result Value Ref Range   WBC, UA 11-30 (A) 0 -  5 /hpf   RBC, UA 3-10 (A) 0 -  2 /hpf   Epithelial Cells (non renal) >10 (A) 0 - 10 /hpf   Bacteria, UA Few None seen/Few  Urinalysis, Complete  Result Value Ref Range   Specific Gravity, UA 1.025 1.005 - 1.030   pH, UA 5.0 5.0 - 7.5   Color, UA Yellow Yellow   Appearance Ur Clear Clear   Leukocytes, UA 1+ (A) Negative   Protein, UA Negative Negative/Trace   Glucose, UA Negative Negative   Ketones, UA Negative Negative   RBC, UA 1+ (A) Negative   Bilirubin, UA Negative Negative   Urobilinogen, Ur 0.2 0.2 - 1.0 mg/dL   Nitrite, UA Negative Negative   Microscopic Examination See below:    Assessment & Plan:    1. Interstitial cystitis:  Patient's rescue installation is performed today.  Patient is not wanting to taper her instillations at this time.  She will return next week for her next treatment.    2. Recurrent UTI's:   UA at baseline.    3. Microscopic hematuria:   Cystoscopy completed on 01/04/2016 was negative.    4. Falling:   Patient has been evaluated by cardiology and they do not feel the falls other results of a cardiac issue.  We will need to assist the patient with undressing and getting on exam tables at this time.   Return in about 1 week (around 01/19/2016) for rescue solution.  Zara Council, [facility] [address], [place], [place] [address] [phone]

## 2016-01-12 NOTE — Therapy (Signed)
[place] MAIN [facility] SERVICES [address] [place], [place], [address] Phone: [phone]   Fax:  [phone]  Physical Therapy Treatment  Patient Details  Name: [name] MRN: [id number] Date of Birth: Sep 10, 1929 Referring Provider: Sanda Klein  Encounter Date: 01/12/2016      PT End of Session - 01/12/16 1444    Visit Number 8   Number of Visits 9   Date for PT Re-Evaluation 01/17/16   Authorization Type 8/10   PT Start Time 1345   PT Stop Time 1430   PT Time Calculation (min) 45 min   Equipment Utilized During Treatment Gait belt   Activity Tolerance Patient tolerated treatment well   Behavior During Therapy [facility] for tasks assessed/performed      Past Medical History  Diagnosis Date  . GERD (gastroesophageal reflux disease)   . Vertigo   . Essential hypertension, benign   . Paroxysmal supraventricular tachycardia ([place])   . Pure hypercholesterolemia   . MS (multiple sclerosis) ([place])   . Fibromyalgia   . Diverticulosis   . Diabetes mellitus without complication ([place])   . Osteoarthritis   . Dyslipidemia   . SOB (shortness of breath)     SECONDARY TO REFLUX  . Chest pain     SECONDARY TO GERD  . Peripheral neuropathy (HCC)     MILD  . Cancer ([place])     breast  . Depression   . Chronic interstitial cystitis   . History of fractured rib   . Chronic right hip pain   . History of TIA (transient ischemic attack)   . Carpal tunnel syndrome   . Thyroid nodule   . Hearing loss of left ear   . Vitamin B 12 deficiency   . Protein malnutrition ([place])   . Frequent falls   . Anxiety and depression   . Irritable bowel   . Recurrent UTI   . Solitary pulmonary nodule on lung CT 12/04/2015    3 mm LLL lung nodule June 2016, March 2017  . Tachycardia, paroxysmal (HCC)     Reportedly Paroxysmal Supraventricular Tachycardia, but unable to find documentation confirming this    Past Surgical History  Procedure Laterality Date  . Total  abdominal hysterectomy w/ bilateral salpingoophorectomy    . Mastectomy Right     DX MASTITIS/NO CANCER.Marland KitchenSALINE IMPLANT  . Cholecystectomy    . Hemorrhoid surgery      WITH RECONSTRUCTION  . Bladder tacking      X 3  . Eye surgeries      WITH BUCKLE DETACHMENT OF THE RETINA  . Tonsillectomy    . Appendectomy    . Tympanoplasty    . Breast surgery    . Mastectomy    . Breast lumpectomy    . Colonoscopy with propofol N/A 01/21/2015    Procedure: COLONOSCOPY WITH PROPOFOL;  Surgeon: Josefine Class, MD;  Location: [facility] ENDOSCOPY;  Service: Endoscopy;  Laterality: N/A;  . Esophagogastroduodenoscopy N/A 01/21/2015    Procedure: ESOPHAGOGASTRODUODENOSCOPY (EGD);  Surgeon: Josefine Class, MD;  Location: [facility] ENDOSCOPY;  Service: Endoscopy;  Laterality: N/A;  . Cardiac catheterization  10/2001    Normal coronary arteries with the exception of 20% proximal D1    There were no vitals filed for this visit.      Subjective Assessment - 01/12/16 1427    Subjective pt reports she feels like her balance is a little better. she still feels unsteady, but is "able to catch herself"  Patient is accompained by: Family member   Limitations Walking;House hold activities   Patient Stated Goals "to not fall as much, to decrease pain, to be less dizzy."   Pain Onset More than a month ago        gait training: Walking on treadmill x 10 min; max verbal and visual cues for proper sequencing, foot placement, and to stay on task; close CGA throughout 140 ft with rollator in hall; CGA throughout mod cues for increased step length  Stepping over short obstacles fwd and sideways  (1/2 foam rolls) in // bars x 5 laps each; pt required mod-max verbal/tactile cueing for proper sequencing and attention to task; CGA throughout  NMR: Bil static standing bal on 1/2 foam roll w/ no UE support; CGA - min A throughout to maintain balance 2 x 2 min  Perturbations in bil stance in // bars; CGA-min A to  maintain balance x 8 min  VOR 1 with vertical and horizontal on mirror x 2 and plain background x 1; CGA due to c/o dizziness after about 15 seconds each trial x 5 min total multiple bouts                           PT Education - 01/12/16 1442    Education provided Yes   Education Details proper technique and reinstructions for exercise   Person(s) Educated Patient   Methods Explanation   Comprehension Verbalized understanding             PT Long Term Goals - 12/20/15 1507    PT LONG TERM GOAL #1   Title pt will improve berg balance score by 6pts to demo improved balance    Time 6   Period Weeks   Status New   PT LONG TERM GOAL #2   Title Patient will demonstrate improved safety by improving gait speed to 10 meters in < 20 sec.    Baseline 26 sec   Time 6   Period Weeks   Status New   PT LONG TERM GOAL #3   Title Patient will demonstrate improved mobility by performing 5x STS in < 20 sec.    Baseline 28s   Time 6   Period Weeks   Status New               Plan - 01/12/16 1445    Clinical Impression Statement pt continues to make improvements in balance and strength however, she still has remaining deficits. she demonstrates diminished ankle strategies with dynamic balance exercises as evidenced by her tendency to hip hinge to maintain balance. she tolerated perturbations in standing well but demonstrated delayed stepping strategies and still displayed increased hip hinging to maintain balance. she had the most difficulty with ant/post purturbations versus lateral. also performed VOR exercises with pt which reproduced her dizziness, vertical > horizontal. pt needs continued skilled PT intervention to address remaining deficits in order to decrease risk of falls and improve overall function.   Rehab Potential Fair   Clinical Impairments Affecting Rehab Potential co-morbidities    PT Frequency 2x / week   PT Duration 6 weeks   PT  Treatment/Interventions Moist Heat;Therapeutic exercise;Therapeutic activities;Gait training;Neuromuscular re-education;Balance training;Aquatic Therapy;Patient/family education;Manual techniques;Energy conservation;Vestibular;ADLs/Self Care Home Management;Canalith Repostioning;Cryotherapy;Electrical Stimulation   PT Next Visit Plan Continue balance and LE strengthening   PT Home Exercise Plan Sit to stand, heel/toe rocking, single leg balance   Consulted and Agree with Plan of Care Patient  Patient will benefit from skilled therapeutic intervention in order to improve the following deficits and impairments:  Abnormal gait, Decreased activity tolerance, Decreased balance, Decreased strength, Decreased mobility, Difficulty walking, Dizziness, Pain, Decreased safety awareness, Decreased endurance, Decreased range of motion  Visit Diagnosis: Unsteadiness on feet  Muscle weakness (generalized)     Problem List Patient Active Problem List   Diagnosis Date Noted  . IC (interstitial cystitis) 12/12/2015  . Solitary pulmonary nodule on lung CT 12/04/2015  . Abnormal CAT scan 11/21/2015  . Generalized degenerative joint disease of hand 11/21/2015  . Triggering of digit 11/21/2015  . Snapping thumb syndrome 11/21/2015  . Pain, joint, hand 11/11/2015  . Coronary artery calcification seen on CAT scan 11/11/2015  . Multiple sclerosis ([place]) 11/11/2015  . Dizziness 11/11/2015  . Interstitial cystitis 10/24/2015  . Diabetic neuropathy with neurologic complication ([place]) [id number]  . Chronic pain of multiple joints 09/02/2015  . Right lower quadrant abdominal pain 08/02/2015  . Microscopic hematuria 07/12/2015  . Recurrent UTI 04/09/2015  . Dysuria 04/01/2015  . Carpal tunnel syndrome 01/12/2014  . DD (diverticular disease) 01/12/2014  . Carcinoma in situ, breast, ductal 01/12/2014  . H/O surgical procedure 01/12/2014  . H/O right mastectomy 01/12/2014  . Hypercholesteremia 01/12/2014   . DS (disseminated sclerosis) ([place]) 01/12/2014  . Arthritis, degenerative 01/12/2014  . Thyroid nodule 01/12/2014  . Cyanocobalamine deficiency (non anemic) 01/12/2014  . Anxiety and depression 01/12/2014  . Diabetes mellitus ([employer] [employer]) 01/12/2014  . Intraductal carcinoma of breast 01/12/2014  . History of surgical procedure 01/12/2014  . History of right mastectomy 01/12/2014  . Hypercholesterolemia 01/12/2014  . DM (diabetes mellitus) type II controlled, neurological manifestation ([place]) 06/26/2010  . Depression, major, recurrent, in partial remission ([place]) 06/26/2010  . Frequent falls 06/26/2010  . Stricture and stenosis of esophagus 06/26/2010  . Gastro-esophageal reflux disease without esophagitis 06/26/2010  . Fibromyalgia 06/26/2010  . Chronic interstitial cystitis 06/26/2010  . H/O malignant neoplasm of breast 06/26/2010  . Major depressive disorder with single episode ([place]) 06/26/2010  . Controlled type 2 diabetes mellitus without complication ([place]) [id number]  . Hypertension goal BP (blood pressure) < 140/90 06/01/2009  . H/O paroxysmal supraventricular tachycardia 06/01/2009  . Paroxysmal supraventricular tachycardia ([place]) 06/01/2009  . Irritable bowel syndrome with constipation and diarrhea 06/16/2008  . Diarrhea, functional 06/16/2008  . D (diarrhea) 06/16/2008    Geraldine Solar, SPT  This entire session was performed under direct supervision and direction of a licensed therapist/therapist assistant . I have personally read, edited and approve of the note as written. Gorden Harms. Tortorici, PT, DPT [phone]  Tortorici,Ashley 01/12/2016, 3:17 PM  [place] MAIN [facility] SERVICES [address] [place], [place], [address] Phone: [phone]   Fax:  [phone]  Name: [name] MRN: [id number] Date of Birth: 09/22/1929

## 2016-01-14 ENCOUNTER — Other Ambulatory Visit: Payer: Self-pay | Admitting: Urology

## 2016-01-15 LAB — CULTURE, URINE COMPREHENSIVE

## 2016-01-17 ENCOUNTER — Ambulatory Visit: Payer: Commercial Managed Care - HMO

## 2016-01-17 DIAGNOSIS — R2681 Unsteadiness on feet: Secondary | ICD-10-CM | POA: Diagnosis not present

## 2016-01-17 DIAGNOSIS — M6281 Muscle weakness (generalized): Secondary | ICD-10-CM | POA: Diagnosis not present

## 2016-01-17 NOTE — Therapy (Signed)
[place] MAIN [facility] SERVICES [address] [place], [place], [address] Phone: [phone]   Fax:  [phone]  Physical Therapy Treatment  Patient Details  Name: [name] MRN: [id number] Date of Birth: [birth year] Referring Provider: Sanda Klein  Encounter Date: 01/17/2016      PT End of Session - 01/17/16 1433    Visit Number 9   Number of Visits 9   Date for PT Re-Evaluation 01/17/16   Authorization Type 9/10   PT Start Time 1345   PT Stop Time 1430   PT Time Calculation (min) 45 min   Equipment Utilized During Treatment Gait belt   Activity Tolerance Patient tolerated treatment well   Behavior During Therapy [facility] for tasks assessed/performed      Past Medical History  Diagnosis Date  . GERD (gastroesophageal reflux disease)   . Vertigo   . Essential hypertension, benign   . Paroxysmal supraventricular tachycardia ([place])   . Pure hypercholesterolemia   . MS (multiple sclerosis) ([place])   . Fibromyalgia   . Diverticulosis   . Diabetes mellitus without complication ([place])   . Osteoarthritis   . Dyslipidemia   . SOB (shortness of breath)     SECONDARY TO REFLUX  . Chest pain     SECONDARY TO GERD  . Peripheral neuropathy (HCC)     MILD  . Cancer ([place])     breast  . Depression   . Chronic interstitial cystitis   . History of fractured rib   . Chronic right hip pain   . History of TIA (transient ischemic attack)   . Carpal tunnel syndrome   . Thyroid nodule   . Hearing loss of left ear   . Vitamin B 12 deficiency   . Protein malnutrition ([place])   . Frequent falls   . Anxiety and depression   . Irritable bowel   . Recurrent UTI   . Solitary pulmonary nodule on lung CT 12/04/2015    3 mm LLL lung nodule June 2016, March 2017  . Tachycardia, paroxysmal (HCC)     Reportedly Paroxysmal Supraventricular Tachycardia, but unable to find documentation confirming this    Past Surgical History  Procedure Laterality Date  . Total  abdominal hysterectomy w/ bilateral salpingoophorectomy    . Mastectomy Right     DX MASTITIS/NO CANCER.Marland KitchenSALINE IMPLANT  . Cholecystectomy    . Hemorrhoid surgery      WITH RECONSTRUCTION  . Bladder tacking      X 3  . Eye surgeries      WITH BUCKLE DETACHMENT OF THE RETINA  . Tonsillectomy    . Appendectomy    . Tympanoplasty    . Breast surgery    . Mastectomy    . Breast lumpectomy    . Colonoscopy with propofol N/A 01/21/2015    Procedure: COLONOSCOPY WITH PROPOFOL;  Surgeon: Josefine Class, MD;  Location: [facility] ENDOSCOPY;  Service: Endoscopy;  Laterality: N/A;  . Esophagogastroduodenoscopy N/A 01/21/2015    Procedure: ESOPHAGOGASTRODUODENOSCOPY (EGD);  Surgeon: Josefine Class, MD;  [facility] END[facility];  Service: Endoscopy;  Laterality: N/A;  . Cardiac catheterization  10/2001    Normal coronary arteries with the exception of 20% proximal D1    There were no vitals filed for this visit.      Subjective Assessment - 01/17/16 1346    Subjective pt reports feeling tired today after attending her granddaughters graduation yesterday. She reports having a near fall at her daughters house when  she tried to ambulate up stairs. her daughter prevented her from falling.   Patient is accompained by: Family member   Limitations Walking;House hold activities   Patient Stated Goals "to not fall as much, to decrease pain, to be less dizzy."   Pain Score 8    Pain Location Foot   Pain Orientation Left   Pain Descriptors / Indicators Sharp   Pain Onset More than a month ago     THEREX -- not billed today as pt was treated not under direct supervision of PT due to scheduling conflict (x 10 min) nustep x 5 min L2, unbilled bil step up 6" step 1x 12 w/ 1 UE support each side; 1x10 w/o UE support each side; min verbal cues for proper technique and CGA - min A for balance throughout  NMR -- not billed today as pt was treated not under direct supervision of PT due to scheduling  conflict (x 8 min) small rocker board lateral and bil a/p weight shifts x 8 min; min tactile cues proper weight shift, CGA - min A for balance  GAIT TRAINING (x 18 min charged) walking x 140 ft; CGA, min - mod verbal cues for increased step length throughout, CGA  fwd and side to side stepping over 1/2 foam rolls in // bars x 5 laps each; mod A for balance, pt frequently not clearing foot enough, especially with side to side; mod verbal cues for increased foot clearance throughout Navigating obstacles with rollator, x 8 laps; min A and max verbal cues for balance throughout as rollator kept getting away from pt and she continued to walk at an unsafe gait speed                 PT Education - 01/17/16 1432    Education provided Yes   Education Details weight shifting, decrease gait speed and increase step length   Person(s) Educated Patient   Methods Explanation   Comprehension Verbalized understanding             PT Long Term Goals - 12/20/15 1507    PT LONG TERM GOAL #1   Title pt will improve berg balance score by 6pts to demo improved balance    Time 6   Period Weeks   Status New   PT LONG TERM GOAL #2   Title Patient will demonstrate improved safety by improving gait speed to 10 meters in < 20 sec.    Baseline 26 sec   Time 6   Period Weeks   Status New   PT LONG TERM GOAL #3   Title Patient will demonstrate improved mobility by performing 5x STS in < 20 sec.    Baseline 28s   Time 6   Period Weeks   Status New               Plan - 01/17/16 1435    Clinical Impression Statement pt making some progress towards goals. she continues to demonstrate gait deviations as evidenced by her decreased step length on R and frequent tendency to have decreased foot clearance. she also demonstrated increased gait speed and would let her rollator get ahead of her today and needed mod - max verbal and tactile cues to keep her rollator close and decrease gait to a safe  speed. she demonstrated difficulty with stepping over obstacles today and continues to have difficulty properly shifting weight over BOS. pt needs continued skilled PT intervention to address remaining deficits and improve overall  function and decrease risk of falls.   Rehab Potential Fair   Clinical Impairments Affecting Rehab Potential co-morbidities    PT Frequency 2x / week   PT Duration 6 weeks   PT Treatment/Interventions Moist Heat;Therapeutic exercise;Therapeutic activities;Gait training;Neuromuscular re-education;Balance training;Aquatic Therapy;Patient/family education;Manual techniques;Energy conservation;Vestibular;ADLs/Self Care Home Management;Canalith Repostioning;Cryotherapy;Electrical Stimulation   PT Next Visit Plan Continue balance and LE strengthening   PT Home Exercise Plan Sit to stand, heel/toe rocking, single leg balance   Consulted and Agree with Plan of Care Patient      Patient will benefit from skilled therapeutic intervention in order to improve the following deficits and impairments:  Abnormal gait, Decreased activity tolerance, Decreased balance, Decreased strength, Decreased mobility, Difficulty walking, Dizziness, Pain, Decreased safety awareness, Decreased endurance, Decreased range of motion  Visit Diagnosis: Unsteadiness on feet  Muscle weakness (generalized)     Problem List Patient Active Problem List   Diagnosis Date Noted  . IC (interstitial cystitis) 12/12/2015  . Solitary pulmonary nodule on lung CT 12/04/2015  . Abnormal CAT scan 11/21/2015  . Generalized degenerative joint disease of hand 11/21/2015  . Triggering of digit 11/21/2015  . Snapping thumb syndrome 11/21/2015  . Pain, joint, hand 11/11/2015  . Coronary artery calcification seen on CAT scan 11/11/2015  . Multiple sclerosis ([place]) 11/11/2015  . Dizziness 11/11/2015  . Interstitial cystitis 10/24/2015  . Diabetic neuropathy with neurologic complication ([place]) [id number]  . Chronic  pain of multiple joints 09/02/2015  . Right lower quadrant abdominal pain 08/02/2015  . Microscopic hematuria 07/12/2015  . Recurrent UTI 04/09/2015  . Dysuria 04/01/2015  . Carpal tunnel syndrome 01/12/2014  . DD (diverticular disease) 01/12/2014  . Carcinoma in situ, breast, ductal 01/12/2014  . H/O surgical procedure 01/12/2014  . H/O right mastectomy 01/12/2014  . Hypercholesteremia 01/12/2014  . DS (disseminated sclerosis) ([place]) 01/12/2014  . Arthritis, degenerative 01/12/2014  . Thyroid nodule 01/12/2014  . Cyanocobalamine deficiency (non anemic) 01/12/2014  . Anxiety and depression 01/12/2014  . Diabetes mellitus ([place]) 01/12/2014  . Intraductal carcinoma of breast 01/12/2014  . History of surgical procedure 01/12/2014  . History of right mastectomy 01/12/2014  . Hypercholesterolemia 01/12/2014  . DM (diabetes mellitus) type II controlled, neurological manifestation ([place]) 06/26/2010  . Depression, major, recurrent, in partial remission ([place]) 06/26/2010  . Frequent falls 06/26/2010  . Stricture and stenosis of esophagus 06/26/2010  . Gastro-esophageal reflux disease without esophagitis 06/26/2010  . Fibromyalgia 06/26/2010  . Chronic interstitial cystitis 06/26/2010  . H/O malignant neoplasm of breast 06/26/2010  . Major depressive disorder with single episode ([place]) 06/26/2010  . Controlled type 2 diabetes mellitus without complication ([place]) [id number]  . Hypertension goal BP (blood pressure) < 140/90 06/01/2009  . H/O paroxysmal supraventricular tachycardia 06/01/2009  . Paroxysmal supraventricular tachycardia ([place]) 06/01/2009  . Irritable bowel syndrome with constipation and diarrhea 06/16/2008  . Diarrhea, functional 06/16/2008  . D (diarrhea) 06/16/2008   Geraldine Solar, SPT This entire session was performed under direct supervision and direction of a licensed therapist/therapist assistant . I have personally read, edited and approve of the note as written. Gorden Harms.  Tortorici, PT, DPT [phone]   Tortorici,Ashley 01/17/2016, 3:25 PM  [place] MAIN [facility] SERVICES [address] [place], [place], [address] Phone: [phone]   Fax:  [phone]  Name: ANGELETTE ROTHWELL MRN: [id number] Date of Birth: April 02, 1930

## 2016-01-18 ENCOUNTER — Other Ambulatory Visit: Payer: Self-pay | Admitting: *Deleted

## 2016-01-18 NOTE — Patient Outreach (Signed)
[place] [facility]) Care Management  01/18/2016  [name] 09/16/1929 [id number]   RN Health Coach  attempted   outreach call to patient.  Patient was unavailable. HIPPA compliance voicemail message was left with return callback number.  Plan: RN will call patient again within 10 days  [place] Management [phone]

## 2016-01-19 ENCOUNTER — Encounter: Payer: Self-pay | Admitting: Urology

## 2016-01-19 ENCOUNTER — Ambulatory Visit: Payer: Commercial Managed Care - HMO

## 2016-01-19 ENCOUNTER — Ambulatory Visit (INDEPENDENT_AMBULATORY_CARE_PROVIDER_SITE_OTHER): Payer: Commercial Managed Care - HMO | Admitting: Urology

## 2016-01-19 VITALS — BP 137/74 | HR 83 | Ht 61.0 in | Wt 164.5 lb

## 2016-01-19 DIAGNOSIS — R2681 Unsteadiness on feet: Secondary | ICD-10-CM

## 2016-01-19 DIAGNOSIS — N3010 Interstitial cystitis (chronic) without hematuria: Secondary | ICD-10-CM | POA: Diagnosis not present

## 2016-01-19 DIAGNOSIS — M6281 Muscle weakness (generalized): Secondary | ICD-10-CM

## 2016-01-19 LAB — URINALYSIS, COMPLETE
BILIRUBIN UA: NEGATIVE
Glucose, UA: NEGATIVE
Ketones, UA: NEGATIVE
Nitrite, UA: NEGATIVE
PH UA: 5 (ref 5.0–7.5)
PROTEIN UA: NEGATIVE
RBC UA: NEGATIVE
Specific Gravity, UA: 1.025 (ref 1.005–1.030)
Urobilinogen, Ur: 0.2 mg/dL (ref 0.2–1.0)

## 2016-01-19 LAB — MICROSCOPIC EXAMINATION: BACTERIA UA: NONE SEEN

## 2016-01-19 MED ORDER — SODIUM BICARBONATE 8.4 % IV SOLN
11.0000 mL | Freq: Once | INTRAVENOUS | Status: AC
  Administered 2016-01-19: 11 mL

## 2016-01-19 NOTE — Progress Notes (Signed)
11:58 AM   [name] [birth year] [id number]  Referring provider: Arnetha Courser, MD [address] [place], [place] [address]  Chief Complaint  Patient presents with  . Follow-up    rescue solution    HPI: Patient is an 82 year old Caucasian female with a history of IC who presents today for rescue installation.     Patient has been experiencing falls recently. She has been evaluated by cardiology and they feel that this is not of a cardiac nature.   She is currently working with physical therapist.    Patient underwent cystoscopy with Dr. Pilar Jarvis for persistent microscopic hematuria.  It was normal.    Today, she states she is urgency, dysuria and nocturia.  She denies fever, chills, nausea or vomiting. Her UA today is positive for 6-10 WBC's/hpf.    Patient had a CT abdomen and pelvis wo contrast on 08/03/2015 which noted a stable hyperdense cyst in the left kidney.  A cystoscopy on 01/04/2016 which was negative.    PMH: Past Medical History  Diagnosis Date  . GERD (gastroesophageal reflux disease)   . Vertigo   . Essential hypertension, benign   . Paroxysmal supraventricular tachycardia ([place])   . Pure hypercholesterolemia   . MS (multiple sclerosis) ([place])   . Fibromyalgia   . Diverticulosis   . Diabetes mellitus without complication ([place])   . Osteoarthritis   . Dyslipidemia   . SOB (shortness of breath)     SECONDARY TO REFLUX  . Chest pain     SECONDARY TO GERD  . Peripheral neuropathy (HCC)     MILD  . Cancer ([place])     breast  . Depression   . Chronic interstitial cystitis   . History of fractured rib   . Chronic right hip pain   . History of TIA (transient ischemic attack)   . Carpal tunnel syndrome   . Thyroid nodule   . Hearing loss of left ear   . Vitamin B 12 deficiency   . Protein malnutrition ([place])   . Frequent falls   . Anxiety and depression   . Irritable bowel   . Recurrent UTI   . Solitary pulmonary nodule on lung CT  12/04/2015    3 mm LLL lung nodule June 2016, March 2017  . Tachycardia, paroxysmal (HCC)     Reportedly Paroxysmal Supraventricular Tachycardia, but unable to find documentation confirming this    Surgical History: Past Surgical History  Procedure Laterality Date  . Total abdominal hysterectomy w/ bilateral salpingoophorectomy    . Mastectomy Right     DX MASTITIS/NO CANCER.Marland KitchenSALINE IMPLANT  . Cholecystectomy    . Hemorrhoid surgery      WITH RECONSTRUCTION  . Bladder tacking      X 3  . Eye surgeries      WITH BUCKLE DETACHMENT OF THE RETINA  . Tonsillectomy    . Appendectomy    . Tympanoplasty    . Breast surgery    . Mastectomy    . Breast lumpectomy    . Colonoscopy with propofol N/A 01/21/2015    Procedure: COLONOSCOPY WITH PROPOFOL;  Surgeon: Josefine Class, MD;  Location: [facility] ENDOSCOPY;  Service: Endoscopy;  Laterality: N/A;  . Esophagogastroduodenoscopy N/A 01/21/2015    Procedure: ESOPHAGOGASTRODUODENOSCOPY (EGD);  Surgeon: Josefine Class, MD;  Location: [facility] ENDOSCOPY;  Service: Endoscopy;  Laterality: N/A;  . Cardiac catheterization  10/2001    Normal coronary arteries with the exception  of 20% proximal D1    Home Medications:    Medication List       This list is accurate as of: 01/19/16 11:58 AM.  Always use your most recent med list.               acetaminophen 500 MG tablet  Commonly known as:  TYLENOL  Take by mouth.     amoxicillin-clavulanate 875-125 MG tablet  Commonly known as:  AUGMENTIN  Take 1 tablet by mouth every 12 (twelve) hours.     aspirin EC 81 MG tablet  Take 1 tablet (81 mg total) by mouth daily.     b complex vitamins tablet  Take 1 tablet by mouth daily.     [employer] VITAMIN B12 2000 MCG tablet  Generic drug:  cyanocobalamin  Take 2,000 mcg by mouth daily.     dicyclomine 10 MG capsule  Commonly known as:  BENTYL  Take by mouth.     ferrous sulfate 325 (65 FE) MG tablet  Take 1 tablet (325 mg total) by mouth daily  with breakfast.     fluticasone 50 MCG/ACT nasal spray  Commonly known as:  FLONASE  SPRAY 2 SPRAYS EACH INTO BOTH NOSTRILS ONCE DAILY EACH NIGHT.     GAS-X EXTRA STRENGTH 125 MG Caps  Generic drug:  Simethicone  Take 1 capsule by mouth as needed. Reported on 11/24/2015     GENTEAL OP  Apply 1 drop to eye. Each eye as needed     hydrocortisone 25 MG suppository  Commonly known as:  ANUSOL-HC  Place 1 suppository (25 mg total) rectally 2 (two) times daily.     loperamide 2 MG tablet  Commonly known as:  IMODIUM A-D  Take 2 mg by mouth 4 (four) times daily as needed for diarrhea or loose stools.     meclizine 25 MG tablet  Commonly known as:  ANTIVERT  TAKE ONE TO TWO TABLETS BY MOUTH EVERY 8 HOURS AS NEEDED     metFORMIN 500 MG tablet  Commonly known as:  GLUCOPHAGE  Take 1 tablet (500 mg total) by mouth 2 (two) times daily with a meal.     neomycin-polymyxin-hydrocortisone 3.5-10000-1 otic suspension  Commonly known as:  CORTISPORIN  Reported on 01/05/2016     nystatin cream  Commonly known as:  MYCOSTATIN  Apply 1 application topically 2 (two) times daily.     omeprazole 40 MG capsule  Commonly known as:  PRILOSEC  Take 40 mg by mouth daily.     ondansetron 4 MG tablet  Commonly known as:  ZOFRAN  Take 4 mg by mouth every 8 (eight) hours as needed for nausea or vomiting. Reported on 12/22/2015     polyethylene glycol packet  Commonly known as:  MIRALAX / GLYCOLAX  Take 17 g by mouth daily as needed. Reported on 11/15/2015     PROBIOTIC COLON SUPPORT Caps  Take 1 capsule by mouth daily.     rosuvastatin 5 MG tablet  Commonly known as:  CRESTOR  Take 1 tablet (5 mg total) by mouth daily at 6 PM.     sertraline 50 MG tablet  Commonly known as:  ZOLOFT  Take 1 tablet (50 mg total) by mouth daily.     URIBEL 118 MG Caps  ONE TABLET FOUR TIMES DAILY AS NEEDED WITH 8 OZ OF WATER     VITAMIN D-1000 MAX ST 1000 units tablet  Generic drug:  Cholecalciferol  Take  1,000 Units by  mouth daily. Reported on 01/12/2016     Vitamin D3 50000 units Caps  Take 5,000 Int'l Units/day by mouth daily. Reported on 12/29/2015     Vitamins A & D 5000-400 units Caps  Take 1 capsule by mouth daily. Reported on 12/29/2015        Allergies:  Allergies  Allergen Reactions  . Biaxin [Clarithromycin] Other (See Comments) and Diarrhea  . Copaxone [Glatiramer Acetate]   . Decadron [Dexamethasone] Nausea And Vomiting and Other (See Comments)    Other reaction(s): Muscle Pain Reaction:  Abdominal pain  . Dexamethasone Sodium Phosphate Other (See Comments)  . Diltiazem Hcl Other (See Comments)    Reaction:  Unknown   . Diltiazem Hcl Other (See Comments)  . Flagyl [Metronidazole] Nausea Only and Diarrhea    Other reaction(s): Vomiting  . Gabapentin Other (See Comments)    "burning all over" Reaction:  Unknown   . Iohexol Swelling and Other (See Comments)     Desc: tongue swelling with "IVP dye" sccording to nurses notes with a lumbar myelo-12/09- asm, Onset Date: [id number]  Pts tongue swells.  Clementeen Hoof [Iodinated Diagnostic Agents] Swelling and Other (See Comments)    Passed out  Pts tongue swells.   . Levothyroxine Nausea Only  . Sulfa Antibiotics Other (See Comments)    Reaction:  Unknown   . Sulfonamide Derivatives Other (See Comments)    Reaction:  Unknown   . Synthroid [Levothyroxine Sodium]   . Copaxone  [Glatiramer] Rash  . Interferon Beta-1a Other (See Comments) and Rash    Reaction:  Unknown     Family History: Family History  Problem Relation Age of Onset  . Arthritis Sister   . Aneurysm Brother   . Heart disease Daughter   . ALS Mother   . Colon cancer    . Kidney disease Sister     Social History:  reports that she has never smoked. She has never used smokeless tobacco. She reports that she does not drink alcohol or use illicit drugs.  ROS: UROLOGY Frequent Urination?: No Hard to postpone urination?: Yes Burning/pain with urination?:  Yes Get up at night to urinate?: Yes Leakage of urine?: No Urine stream starts and stops?: No Trouble starting stream?: No Do you have to strain to urinate?: No Blood in urine?: No Urinary tract infection?: No Sexually transmitted disease?: No Injury to kidneys or bladder?: No Painful intercourse?: No Weak stream?: No Currently pregnant?: No Vaginal bleeding?: No Last menstrual period?: n  Gastrointestinal Nausea?: No Vomiting?: No Indigestion/heartburn?: No Diarrhea?: No Constipation?: No  Constitutional Fever: No Night sweats?: No Weight loss?: No Fatigue?: No  Skin Skin rash/lesions?: No Itching?: No  Eyes Blurred vision?: Yes Double vision?: No  Ears/Nose/Throat Sore throat?: No Sinus problems?: No  Hematologic/Lymphatic Swollen glands?: No Easy bruising?: No  Cardiovascular Leg swelling?: No Chest pain?: No  Respiratory Cough?: No Shortness of breath?: No  Endocrine Excessive thirst?: No  Musculoskeletal Back pain?: Yes Joint pain?: Yes  Neurological Headaches?: Yes Dizziness?: Yes  Psychologic Depression?: Yes Anxiety?: Yes  Physical Exam: Blood pressure 137/74, pulse 83, height _0  (1.549 m), weight 164 lb 8 oz (74.617 kg). Constitutional: Well nourished. Alert and oriented, No acute distress. HEENT: [place] AT, moist mucus membranes. Trachea midline, no masses. Cardiovascular: No clubbing, cyanosis, or edema. Respiratory: Normal respiratory effort, no increased work of breathing. GI: Abdomen is soft, non tender, non distended, no abdominal masses.  GU: No CVA tenderness.  No bladder fullness or masses.  Skin: No rashes, bruises or suspicious lesions. Lymph: No cervical or inguinal adenopathy. Neurologic: Grossly intact, no focal deficits, moving all 4 extremities. Psychiatric: Normal mood and affect.  Laboratory Data: Lab Results  Component Value Date   WBC 4.3 10/31/2015   HGB 13.6 10/31/2015   HCT 40.9 10/31/2015   MCV  91.0 10/31/2015   PLT 190 10/31/2015   Lab Results  Component Value Date   CREATININE 0.98 12/28/2015   Lab Results  Component Value Date   HGBA1C 7.3* 12/28/2015    Urinalysis: Results for orders placed or performed in visit on 01/19/16  Microscopic Examination  Result Value Ref Range   WBC, UA 6-10 (A) 0 -  5 /hpf   RBC, UA 0-2 0 -  2 /hpf   Epithelial Cells (non renal) 0-10 0 - 10 /hpf   Bacteria, UA None seen None seen/Few  Urinalysis, Complete  Result Value Ref Range   Specific Gravity, UA 1.025 1.005 - 1.030   pH, UA 5.0 5.0 - 7.5   Color, UA Yellow Yellow   Appearance Ur Clear Clear   Leukocytes, UA 1+ (A) Negative   Protein, UA Negative Negative/Trace   Glucose, UA Negative Negative   Ketones, UA Negative Negative   RBC, UA Negative Negative   Bilirubin, UA Negative Negative   Urobilinogen, Ur 0.2 0.2 - 1.0 mg/dL   Nitrite, UA Negative Negative   Microscopic Examination See below:    Assessment & Plan:    1. Interstitial cystitis:  Patient's rescue installation is performed today.  Patient is still not wanting to taper her instillations at this time.  She will return next week for her next treatment.    2. Recurrent UTI's:   UA at baseline.    3. Microscopic hematuria:   Cystoscopy completed on 01/04/2016 was negative.  No microscopic hematuria on today's UA.    4. Falling:   Patient has been evaluated by cardiology and they do not feel the falls other results of a cardiac issue.  We will need to assist the patient with undressing and getting on exam tables at this time.   Return in about 1 week (around 01/26/2016) for rescue solution.  Zara Council, [facility] [address] [place], [place] [address] [phone]

## 2016-01-19 NOTE — Progress Notes (Signed)
Bladder Rescue Solution Instillation  Due to IC patient is present today for a Rescue Solution Treatment.  Patient was cleaned and prepped in a sterile fashion with betadine and lidocaine 2% jelly was instilled into the urethra.  A 14 FR catheter was inserted, urine return was noted 169ml, urine was yellow in color.  Instilled a solution consisting of 39ml of Sodium Bicarb, 2 ml Lidocaine and 1 ml of Heparin. The catheter was then removed. Patient tolerated well, no complications were noted.   Performed by: Toniann Fail, LPN

## 2016-01-19 NOTE — Therapy (Signed)
[place] MAIN [facility] SERVICES [address] [place], [place], [address] Phone: [phone]   Fax:  [phone]  Physical Therapy Treatment/Progress Note  Patient Details  Name: [name] MRN: [id number] Date of Birth: 10/29/1929 Referring Provider: Sanda Klein  Encounter Date: 01/19/2016      PT End of Session - 01/19/16 1445    Visit Number 10   Number of Visits 21   Date for PT Re-Evaluation 02/16/16   Authorization Type 1/10   PT Start Time 1345   PT Stop Time 1430   PT Time Calculation (min) 45 min   Equipment Utilized During Treatment Gait belt   Activity Tolerance Patient tolerated treatment well   Behavior During Therapy [facility] for tasks assessed/performed      Past Medical History  Diagnosis Date  . GERD (gastroesophageal reflux disease)   . Vertigo   . Essential hypertension, benign   . Paroxysmal supraventricular tachycardia ([place])   . Pure hypercholesterolemia   . MS (multiple sclerosis) ([place])   . Fibromyalgia   . Diverticulosis   . Diabetes mellitus without complication ([place])   . Osteoarthritis   . Dyslipidemia   . SOB (shortness of breath)     SECONDARY TO REFLUX  . Chest pain     SECONDARY TO GERD  . Peripheral neuropathy (HCC)     MILD  . Cancer ([place])     breast  . Depression   . Chronic interstitial cystitis   . History of fractured rib   . Chronic right hip pain   . History of TIA (transient ischemic attack)   . Carpal tunnel syndrome   . Thyroid nodule   . Hearing loss of left ear   . Vitamin B 12 deficiency   . Protein malnutrition ([place])   . Frequent falls   . Anxiety and depression   . Irritable bowel   . Recurrent UTI   . Solitary pulmonary nodule on lung CT 12/04/2015    3 mm LLL lung nodule June 2016, March 2017  . Tachycardia, paroxysmal (HCC)     Reportedly Paroxysmal Supraventricular Tachycardia, but unable to find documentation confirming this    Past Surgical History  Procedure  Laterality Date  . Total abdominal hysterectomy w/ bilateral salpingoophorectomy    . Mastectomy Right     DX MASTITIS/NO CANCER.Marland KitchenSALINE IMPLANT  . Cholecystectomy    . Hemorrhoid surgery      WITH RECONSTRUCTION  . Bladder tacking      X 3  . Eye surgeries      WITH BUCKLE DETACHMENT OF THE RETINA  . Tonsillectomy    . Appendectomy    . Tympanoplasty    . Breast surgery    . Mastectomy    . Breast lumpectomy    . Colonoscopy with propofol N/A 01/21/2015    Procedure: COLONOSCOPY WITH PROPOFOL;  Surgeon: Josefine Class, MD;  Location: [facility] ENDOSCOPY;  Service: Endoscopy;  Laterality: N/A;  . Esophagogastroduodenoscopy N/A 01/21/2015    Procedure: ESOPHAGOGASTRODUODENOSCOPY (EGD);  Surgeon: Josefine Class, MD;  Location: [facility] ENDOSCOPY;  Service: Endoscopy;  Laterality: N/A;  . Cardiac catheterization  10/2001    Normal coronary arteries with the exception of 20% proximal D1    There were no vitals filed for this visit.      Subjective Assessment - 01/19/16 1353    Subjective pt states "I haven't fallen" since last treatment session. states that taking showers are getting harder.   Patient is accompained  by: Family member   Limitations Walking;House hold activities   Patient Stated Goals "to not fall as much, to decrease pain, to be less dizzy."   Currently in Pain? Yes   Pain Score 8    Pain Location Foot   Pain Orientation Left   Pain Descriptors / Indicators Sore   Pain Onset More than a month ago            [facility] PT Assessment - 01/19/16 1357    Standardized Balance Assessment   Standardized Balance Assessment Five Times Sit to Stand;10 meter walk test   Five times sit to stand comments  23.29 s   10 Meter Walk 0.55 m/s   Berg Balance Test   Sit to Stand Able to stand  independently using hands   Standing Unsupported Able to stand 2 minutes with supervision   Sitting with Back Unsupported but Feet Supported on Floor or Stool Able to sit safely and  securely 2 minutes   Stand to Sit Sits safely with minimal use of hands   Transfers Able to transfer safely, definite need of hands   Standing Unsupported with Eyes Closed Able to stand 10 seconds with supervision   Standing Ubsupported with Feet Together Able to place feet together independently and stand for 1 minute with supervision   From Standing, Reach Forward with Outstretched Arm Can reach forward >12 cm safely (5")   From Standing Position, Pick up Object from Floor Able to pick up shoe, needs supervision   From Standing Position, Turn to Look Behind Over each Shoulder Turn sideways only but maintains balance   Turn 360 Degrees Needs close supervision or verbal cueing   Standing Unsupported, Alternately Place Feet on Step/Stool Able to stand independently and complete 8 steps >20 seconds   Standing Unsupported, One Foot in Front Needs help to step but can hold 15 seconds   Standing on One Leg Tries to lift leg/unable to hold 3 seconds but remains standing independently   Total Score 37     Therex: Nustep x 5 min L3 (unbilled)   NMR: Bil tandem stance 15 x 5 sec hold each with no UE support; pt required CGA - min A for balance and required verbal cues to properly shift weight over BOS; required UE support to maintain balance; tended to lose balance to the R Alternating march with SLS on third march 10x3 sec hold each leg; CGA - min A for balance and required max verbal and mod tactile cues for proper exercise technique SPT reassessed balance outcome measures:  BERG balance: 37/56  5 Time Sit to Stand: 23.29 sec  10MWT: 0.55 m/s w/ rollator        PT Education - 01/19/16 1355    Education provided Yes   Education Details weight shifting, gait pattern, gait speed; POC   Person(s) Educated Patient   Methods Explanation   Comprehension Verbalized understanding             PT Long Term Goals - 01/19/16 1426    PT LONG TERM GOAL #1   Title pt will improve berg balance  score by 6pts to demo improved balance    Time 6   Period Weeks   Status Achieved   PT LONG TERM GOAL #2   Title Patient will demonstrate improved safety by improving gait speed to 10 meters in < 20 sec.    Baseline 26 sec   Time 6   Period Weeks   Status Partially  Met   PT LONG TERM GOAL #3   Title Patient will demonstrate improved mobility by performing 5x STS in < 20 sec.    Baseline 23.29 sec (2016-02-04)   Time 6   Period Weeks   Status Partially Met   PT LONG TERM GOAL #4   Title Pt will improve BERG balance score by >9 points to demonstrate improved balance and decrease risk of falls.    Baseline 37   Time 6   Period Weeks   Status New   PT LONG TERM GOAL #5   Title Pt will improve 10MWT by >0.76ms with rollator to demonstrate improved overall function and limited community ambulation.   Baseline 0.55 m/s   Time 6   Period Weeks   Status New   Additional Long Term Goals   Additional Long Term Goals Yes   PT LONG TERM GOAL #6   Title Pt will ambulate 12 stairs with RUE support with supervision in order for her to enter her daughter's house.   Time 6   Period Weeks   Status New               Plan - 007/01/171446    Clinical Impression Statement Pt reassessed today for progress note and has met 2 goals and partially met 1. All of her outcome measure scores have improved the past 6 weeks. Her BERG improved by 8 points, 5 time sit to stand improved by 5 seconds, and her 10MWT improved by 8 seconds, however, her scores still indicate that she is at risk for falls. She demonstrates the most difficulty with SLS bilaterally, negotiating obstacles, and ambulating stairs. Her gait continues to vary in stability as evidenced by her continued need for cues to increase R step length and decrease overall gait speed for safety. Pt is making progress and new goals have been established for pt to work towards. Pt needs continued skilled PT intervention to further promote  independence, decrease risk of falls, and improve overall function.   Rehab Potential Fair   Clinical Impairments Affecting Rehab Potential co-morbidities    PT Frequency 2x / week   PT Duration 6 weeks   PT Treatment/Interventions Moist Heat;Therapeutic exercise;Therapeutic activities;Gait training;Neuromuscular re-education;Balance training;Aquatic Therapy;Patient/family education;Manual techniques;Energy conservation;Vestibular;ADLs/Self Care Home Management;Canalith Repostioning;Cryotherapy;Electrical Stimulation   PT Next Visit Plan Continue balance and LE strengthening   Consulted and Agree with Plan of Care Patient      Patient will benefit from skilled therapeutic intervention in order to improve the following deficits and impairments:  Abnormal gait, Decreased activity tolerance, Decreased balance, Decreased strength, Decreased mobility, Difficulty walking, Dizziness, Pain, Decreased safety awareness, Decreased endurance, Decreased range of motion  Visit Diagnosis: Unsteadiness on feet - Plan: PT plan of care cert/re-cert  Muscle weakness (generalized) - Plan: PT plan of care cert/re-cert       G-Codes - [id number]    Functional Assessment Tool Used 143mlk/Berg/5xsittostand   Functional Limitation Mobility: Walking and moving around   Mobility: Walking and Moving Around Current Status ([phone] least 20 percent but less than 40 percent impaired, limited or restricted   Mobility: Walking and Moving Around Goal Status ([phone] least 1 percent but less than 20 percent impaired, limited or restricted      Problem List Patient Active Problem List   Diagnosis Date Noted  . IC (interstitial cystitis) 12/12/2015  . Solitary pulmonary nodule on lung CT 12/04/2015  . Abnormal CAT scan 11/21/2015  . Generalized degenerative joint disease  of hand 11/21/2015  . Triggering of digit 11/21/2015  . Snapping thumb syndrome 11/21/2015  . Pain, joint, hand 11/11/2015  . Coronary  artery calcification seen on CAT scan 11/11/2015  . Multiple sclerosis ([place]) 11/11/2015  . Dizziness 11/11/2015  . Interstitial cystitis 10/24/2015  . Diabetic neuropathy with neurologic complication ([place]) 24/93/2419  . Chronic pain of multiple joints 09/02/2015  . Right lower quadrant abdominal pain 08/02/2015  . Microscopic hematuria 07/12/2015  . Recurrent UTI 04/09/2015  . Dysuria 04/01/2015  . Carpal tunnel syndrome 01/12/2014  . DD (diverticular disease) 01/12/2014  . Carcinoma in situ, breast, ductal 01/12/2014  . H/O surgical procedure 01/12/2014  . H/O right mastectomy 01/12/2014  . Hypercholesteremia 01/12/2014  . DS (disseminated sclerosis) ([place]) 01/12/2014  . Arthritis, degenerative 01/12/2014  . Thyroid nodule 01/12/2014  . Cyanocobalamine deficiency (non anemic) 01/12/2014  . Anxiety and depression 01/12/2014  . Diabetes mellitus ([place]) 01/12/2014  . Intraductal carcinoma of breast 01/12/2014  . History of surgical procedure 01/12/2014  . History of right mastectomy 01/12/2014  . Hypercholesterolemia 01/12/2014  . DM (diabetes mellitus) type II controlled, neurological manifestation ([place]) 06/26/2010  . Depression, major, recurrent, in partial remission ([place]) 06/26/2010  . Frequent falls 06/26/2010  . Stricture and stenosis of esophagus 06/26/2010  . Gastro-esophageal reflux disease without esophagitis 06/26/2010  . Fibromyalgia 06/26/2010  . Chronic interstitial cystitis 06/26/2010  . H/O malignant neoplasm of breast 06/26/2010  . Major depressive disorder with single episode ([place]) 06/26/2010  . Controlled type 2 diabetes mellitus without complication ([place]) 91/44/4584  . Hypertension goal BP (blood pressure) < 140/90 06/01/2009  . H/O paroxysmal supraventricular tachycardia 06/01/2009  . Paroxysmal supraventricular tachycardia ([place]) 06/01/2009  . Irritable bowel syndrome with constipation and diarrhea 06/16/2008  . Diarrhea, functional 06/16/2008  . D (diarrhea)  06/16/2008   Geraldine Solar, SPT This entire session was performed under direct supervision and direction of a licensed therapist/therapist assistant . I have personally read, edited and approve of the note as written. Gorden Harms. Tortorici, PT, DPT [phone]   Tortorici,Ashley 01/19/2016, 4:31 PM  [place] MAIN [facility] SERVICES [address] [place], [place], [address] Phone: [phone]   Fax:  [phone]  Name: [name] MRN: [id number] Date of Birth: [birth year]

## 2016-01-21 DIAGNOSIS — H66001 Acute suppurative otitis media without spontaneous rupture of ear drum, right ear: Secondary | ICD-10-CM | POA: Diagnosis not present

## 2016-01-25 ENCOUNTER — Ambulatory Visit (INDEPENDENT_AMBULATORY_CARE_PROVIDER_SITE_OTHER): Payer: Commercial Managed Care - HMO | Admitting: Urology

## 2016-01-25 ENCOUNTER — Encounter: Payer: Self-pay | Admitting: Urology

## 2016-01-25 ENCOUNTER — Ambulatory Visit: Payer: Commercial Managed Care - HMO

## 2016-01-25 VITALS — BP 151/73 | HR 72 | Ht 60.0 in | Wt 160.0 lb

## 2016-01-25 DIAGNOSIS — R3129 Other microscopic hematuria: Secondary | ICD-10-CM | POA: Diagnosis not present

## 2016-01-25 DIAGNOSIS — R2681 Unsteadiness on feet: Secondary | ICD-10-CM | POA: Diagnosis not present

## 2016-01-25 DIAGNOSIS — N3010 Interstitial cystitis (chronic) without hematuria: Secondary | ICD-10-CM | POA: Diagnosis not present

## 2016-01-25 DIAGNOSIS — M6281 Muscle weakness (generalized): Secondary | ICD-10-CM

## 2016-01-25 DIAGNOSIS — N390 Urinary tract infection, site not specified: Secondary | ICD-10-CM

## 2016-01-25 LAB — MICROSCOPIC EXAMINATION

## 2016-01-25 LAB — URINALYSIS, COMPLETE
BILIRUBIN UA: NEGATIVE
Glucose, UA: NEGATIVE
Ketones, UA: NEGATIVE
Nitrite, UA: NEGATIVE
PH UA: 5 (ref 5.0–7.5)
RBC UA: NEGATIVE
Specific Gravity, UA: 1.025 (ref 1.005–1.030)
Urobilinogen, Ur: 0.2 mg/dL (ref 0.2–1.0)

## 2016-01-25 MED ORDER — NYSTATIN 100000 UNIT/GM EX CREA: 1.0000 "application " | TOPICAL_CREAM | Freq: Two times a day (BID) | CUTANEOUS | Status: DC

## 2016-01-25 MED ORDER — SODIUM BICARBONATE 8.4 % IV SOLN
11.0000 mL | Freq: Once | INTRAVENOUS | Status: AC
  Administered 2016-01-25: 11 mL

## 2016-01-25 MED ORDER — LIDOCAINE HCL 2 % EX GEL
1.0000 "application " | Freq: Once | CUTANEOUS | Status: AC
  Administered 2016-01-25: 1 via URETHRAL

## 2016-01-25 NOTE — Therapy (Signed)
[place] MAIN [facility] SERVICES [address] [place], [place], [address] Phone: [phone]   Fax:  [phone]  Physical Therapy Treatment  Patient Details  Name: [name] MRN: [id number] Date of Birth: 1929/11/27 Referring Provider: Sanda Klein  Encounter Date: 01/25/2016      PT End of Session - 01/25/16 1536    Visit Number 11   Number of Visits 21   Date for PT Re-Evaluation 02/16/16   Authorization Type 2/10   PT Start Time 1515   PT Stop Time 1600   PT Time Calculation (min) 45 min   Equipment Utilized During Treatment Gait belt   Activity Tolerance Patient tolerated treatment well   Behavior During Therapy [facility] for tasks assessed/performed      Past Medical History  Diagnosis Date  . GERD (gastroesophageal reflux disease)   . Vertigo   . Essential hypertension, benign   . Paroxysmal supraventricular tachycardia ([place])   . Pure hypercholesterolemia   . MS (multiple sclerosis) ([place])   . Fibromyalgia   . Diverticulosis   . Diabetes mellitus without complication ([place])   . Osteoarthritis   . Dyslipidemia   . SOB (shortness of breath)     SECONDARY TO REFLUX  . Chest pain     SECONDARY TO GERD  . Peripheral neuropathy (HCC)     MILD  . Cancer ([place])     breast  . Depression   . Chronic interstitial cystitis   . History of fractured rib   . Chronic right hip pain   . History of TIA (transient ischemic attack)   . Carpal tunnel syndrome   . Thyroid nodule   . Hearing loss of left ear   . Vitamin B 12 deficiency   . Protein malnutrition ([place])   . Frequent falls   . Anxiety and depression   . Irritable bowel   . Recurrent UTI   . Solitary pulmonary nodule on lung CT 12/04/2015    3 mm LLL lung nodule June 2016, March 2017  . Tachycardia, paroxysmal (HCC)     Reportedly Paroxysmal Supraventricular Tachycardia, but unable to find documentation confirming this    Past Surgical History  Procedure Laterality Date  .  Total abdominal hysterectomy w/ bilateral salpingoophorectomy    . Mastectomy Right     DX MASTITIS/NO CANCER.Marland KitchenSALINE IMPLANT  . Cholecystectomy    . Hemorrhoid surgery      WITH RECONSTRUCTION  . Bladder tacking      X 3  . Eye surgeries      WITH BUCKLE DETACHMENT OF THE RETINA  . Tonsillectomy    . Appendectomy    . Tympanoplasty    . Breast surgery    . Mastectomy    . Breast lumpectomy    . Colonoscopy with propofol N/A 01/21/2015    Procedure: COLONOSCOPY WITH PROPOFOL;  Surgeon: Josefine Class, MD;  Location: [facility] ENDOSCOPY;  Service: Endoscopy;  Laterality: N/A;  . Esophagogastroduodenoscopy N/A 01/21/2015    Procedure: ESOPHAGOGASTRODUODENOSCOPY (EGD);  Surgeon: Josefine Class, MD;  Location: [facility] ENDOSCOPY;  Service: Endoscopy;  Laterality: N/A;  . Cardiac catheterization  10/2001    Normal coronary arteries with the exception of 20% proximal D1    There were no vitals filed for this visit.      Subjective Assessment - 01/25/16 1525    Subjective pt states she has not fallen since last treatment but has had some close calls.   Patient is accompained by: Family  member   Limitations Walking;House hold activities   Patient Stated Goals "to not fall as much, to decrease pain, to be less dizzy."   Currently in Pain? Yes   Pain Score 7    Pain Location Foot   Pain Orientation Left   Pain Descriptors / Indicators Constant   Pain Onset More than a month ago       NMR bil cone taps (x3) x 10 each leg with no UE support; mod verbal cues to shift weight properly; min A for balance throughout bil stance with NBOS with EC with no UE support x 3 min; CGA - min A for balance bil staggered stance with EC with no UE support x 3 min; CGA - min A for balance bil staggered stance with front foot on 4" step and trunk rotation with ball hand off, 2 x 10 each; CGA - min A for balance, more difficult with RLE back  GAIT TRAINING gait training in hallway with rollator 180 ft  x 3; mod cues to decrease speed and keep rollator close fwd/retro and bil side stepping over cane with no UE support 1 x 10 each; close CGA - min Athroughout for balance        PT Education - 01/25/16 1536    Education provided Yes   Education Details gait pattern, weight shifting   Person(s) Educated Patient   Methods Explanation   Comprehension Verbalized understanding             PT Long Term Goals - 01/19/16 1426    PT LONG TERM GOAL #1   Title pt will improve berg balance score by 6pts to demo improved balance    Time 6   Period Weeks   Status Achieved   PT LONG TERM GOAL #2   Title Patient will demonstrate improved safety by improving gait speed to 10 meters in < 20 sec.    Baseline 26 sec   Time 6   Period Weeks   Status Partially Met   PT LONG TERM GOAL #3   Title Patient will demonstrate improved mobility by performing 5x STS in < 20 sec.    Baseline 23.29 sec (01/19/16)   Time 6   Period Weeks   Status Partially Met   PT LONG TERM GOAL #4   Title Pt will improve BERG balance score by >9 points to demonstrate improved balance and decrease risk of falls.    Baseline 37   Time 6   Period Weeks   Status New   PT LONG TERM GOAL #5   Title Pt will improve 10MWT by >0.24ms with rollator to demonstrate improved overall function and limited community ambulation.   Baseline 0.55 m/s   Time 6   Period Weeks   Status New   Additional Long Term Goals   Additional Long Term Goals Yes   PT LONG TERM GOAL #6   Title Pt will ambulate 12 stairs with RUE support with supervision in order for her to enter her daughter's house.   Time 6   Period Weeks   Status New               Plan - 01/25/16 1646    Clinical Impression Statement pt continues to make progress as evidenced by her ability to participate in more challenging balance exercises today. However, she continues to demonstrate difficulty with shifting weight laterally to the R and has more difficulty  with NBOS. Pt continues to need cues for  safe ambulation speed and increased step length (R>L). Pt needs continued skilled intervention to address remaining deficits in order to maximize overall function and decrease risk of falls.   Rehab Potential Fair   Clinical Impairments Affecting Rehab Potential co-morbidities    PT Frequency 2x / week   PT Duration 6 weeks   PT Treatment/Interventions Moist Heat;Therapeutic exercise;Therapeutic activities;Gait training;Neuromuscular re-education;Balance training;Aquatic Therapy;Patient/family education;Manual techniques;Energy conservation;Vestibular;ADLs/Self Care Home Management;Canalith Repostioning;Cryotherapy;Electrical Stimulation   PT Next Visit Plan Continue balance and LE strengthening   Consulted and Agree with Plan of Care Patient      Patient will benefit from skilled therapeutic intervention in order to improve the following deficits and impairments:  Abnormal gait, Decreased activity tolerance, Decreased balance, Decreased strength, Decreased mobility, Difficulty walking, Dizziness, Pain, Decreased safety awareness, Decreased endurance, Decreased range of motion  Visit Diagnosis: Unsteadiness on feet  Muscle weakness (generalized)     Problem List Patient Active Problem List   Diagnosis Date Noted  . IC (interstitial cystitis) 12/12/2015  . Solitary pulmonary nodule on lung CT 12/04/2015  . Abnormal CAT scan 11/21/2015  . Generalized degenerative joint disease of hand 11/21/2015  . Triggering of digit 11/21/2015  . Snapping thumb syndrome 11/21/2015  . Pain, joint, hand 11/11/2015  . Coronary artery calcification seen on CAT scan 11/11/2015  . Multiple sclerosis ([place]) 11/11/2015  . Dizziness 11/11/2015  . Interstitial cystitis 10/24/2015  . Diabetic neuropathy with neurologic complication ([place]) 78/47/8412  . Chronic pain of multiple joints 09/02/2015  . Right lower quadrant abdominal pain 08/02/2015  . Microscopic hematuria  07/12/2015  . Recurrent UTI 04/09/2015  . Dysuria 04/01/2015  . Carpal tunnel syndrome 01/12/2014  . DD (diverticular disease) 01/12/2014  . Carcinoma in situ, breast, ductal 01/12/2014  . H/O surgical procedure 01/12/2014  . H/O right mastectomy 01/12/2014  . Hypercholesteremia 01/12/2014  . DS (disseminated sclerosis) ([place]) 01/12/2014  . Arthritis, degenerative 01/12/2014  . Thyroid nodule 01/12/2014  . Cyanocobalamine deficiency (non anemic) 01/12/2014  . Anxiety and depression 01/12/2014  . Diabetes mellitus ([place]) 01/12/2014  . Intraductal carcinoma of breast 01/12/2014  . History of surgical procedure 01/12/2014  . History of right mastectomy 01/12/2014  . Hypercholesterolemia 01/12/2014  . DM (diabetes mellitus) type II controlled, neurological manifestation ([place]) 06/26/2010  . Depression, major, recurrent, in partial remission ([place]) 06/26/2010  . Frequent falls 06/26/2010  . Stricture and stenosis of esophagus 06/26/2010  . Gastro-esophageal reflux disease without esophagitis 06/26/2010  . Fibromyalgia 06/26/2010  . Chronic interstitial cystitis 06/26/2010  . H/O malignant neoplasm of breast 06/26/2010  . Major depressive disorder with single episode ([place]) 06/26/2010  . Controlled type 2 diabetes mellitus without complication ([place]) 82/03/1387  . Hypertension goal BP (blood pressure) < 140/90 06/01/2009  . H/O paroxysmal supraventricular tachycardia 06/01/2009  . Paroxysmal supraventricular tachycardia ([place]) 06/01/2009  . Irritable bowel syndrome with constipation and diarrhea 06/16/2008  . Diarrhea, functional 06/16/2008  . D (diarrhea) 06/16/2008    Geraldine Solar, SPT This entire session was performed under direct supervision and direction of a licensed therapist/therapist assistant . I have personally read, edited and approve of the note as written. Gorden Harms. Tortorici, PT, DPT [phone]   Tortorici,Ashley 01/26/2016, 9:28 AM  [place] MAIN [facility] SERVICES [address] [place], [place], [address] Phone: [phone]   Fax:  [phone]  Name: [name] MRN: [id number] Date of Birth: [birth year]

## 2016-01-25 NOTE — Progress Notes (Signed)
Bladder Rescue Solution Instillation  Due to interstitial cystitis patient is present today for a Rescue Solution Treatment.  Patient was cleaned and prepped in a sterile fashion with betadine and lidocaine 2% jelly was instilled into the urethra.  A 14FR catheter was inserted, urine return was noted 78ml, urine was dark yellow in color.  Instilled a solution consisting of 59ml of Sodium Bicarb, 2 ml Lidocaine and 1 ml of Heparin. The catheter was then removed. Patient tolerated well, no complications were noted.   Performed by: Lyndee Hensen CMA  Follow up/ Additional Notes: One week

## 2016-01-27 ENCOUNTER — Other Ambulatory Visit: Payer: Self-pay | Admitting: *Deleted

## 2016-01-27 NOTE — Patient Outreach (Signed)
[facility]) Care Management  01/27/2016  [name] Jun 04, 1930 [id number]  RN Health Coach  attempted  Follow up outreach call to patient.  Patient was unavailable. HIPPA compliance voicemail message was left with return callback number.     Plan: RN will call patient again within 14 days.    [place] Care Management [phone]

## 2016-01-30 NOTE — Progress Notes (Signed)
12:49 PM   [name] 11-18-1929 [id number]  Referring provider: Arnetha Courser, MD [address] [place], [place] [address]  Chief Complaint  Patient presents with  . Cystitis    Rescue Solution    HPI: Patient is an 82 year old Caucasian female with a history of IC who presents today for rescue installation.     Patient has been experiencing falls recently. She has been evaluated by cardiology and they feel that this is not of a cardiac nature.   She is currently working with physical therapist.    Patient underwent cystoscopy with Dr. Pilar Jarvis for persistent microscopic hematuria.  It was normal.    Today, she states she is urgency, dysuria, incontinence and nocturia.  She denies fever, chills, nausea or vomiting. Her UA today is positive for 6-10 WBC's/hpf.  She is also experiencing suprapubic discomfort.  She stated that it started 2 days prior to her presentation here. She has not had gross hematuria.  Patient had a CT abdomen and pelvis wo contrast on 08/03/2015 which noted a stable hyperdense cyst in the left kidney.  A cystoscopy on 01/04/2016 which was negative.    PMH: Past Medical History  Diagnosis Date  . GERD (gastroesophageal reflux disease)   . Vertigo   . Essential hypertension, benign   . Paroxysmal supraventricular tachycardia ([place])   . Pure hypercholesterolemia   . MS (multiple sclerosis) ([place])   . Fibromyalgia   . Diverticulosis   . Diabetes mellitus without complication ([place])   . Osteoarthritis   . Dyslipidemia   . SOB (shortness of breath)     SECONDARY TO REFLUX  . Chest pain     SECONDARY TO GERD  . Peripheral neuropathy (HCC)     MILD  . Cancer ([place])     breast  . Depression   . Chronic interstitial cystitis   . History of fractured rib   . Chronic right hip pain   . History of TIA (transient ischemic attack)   . Carpal tunnel syndrome   . Thyroid nodule   . Hearing loss of left ear   . Vitamin B 12 deficiency     . Protein malnutrition ([place])   . Frequent falls   . Anxiety and depression   . Irritable bowel   . Recurrent UTI   . Solitary pulmonary nodule on lung CT 12/04/2015    3 mm LLL lung nodule June 2016, March 2017  . Tachycardia, paroxysmal (HCC)     Reportedly Paroxysmal Supraventricular Tachycardia, but unable to find documentation confirming this    Surgical History: Past Surgical History  Procedure Laterality Date  . Total abdominal hysterectomy w/ bilateral salpingoophorectomy    . Mastectomy Right     DX MASTITIS/NO CANCER.Marland KitchenSALINE IMPLANT  . Cholecystectomy    . Hemorrhoid surgery      WITH RECONSTRUCTION  . Bladder tacking      X 3  . Eye surgeries      WITH BUCKLE DETACHMENT OF THE RETINA  . Tonsillectomy    . Appendectomy    . Tympanoplasty    . Breast surgery    . Mastectomy    . Breast lumpectomy    . Colonoscopy with propofol N/A 01/21/2015    Procedure: COLONOSCOPY WITH PROPOFOL;  Surgeon: Josefine Class, MD;  Location: [facility] ENDOSCOPY;  Service: Endoscopy;  Laterality: N/A;  . Esophagogastroduodenoscopy N/A 01/21/2015    Procedure: ESOPHAGOGASTRODUODENOSCOPY (EGD);  Surgeon: Josefine Class,  MD;  Location: [facility] ENDOSCOPY;  Service: Endoscopy;  Laterality: N/A;  . Cardiac catheterization  10/2001    Normal coronary arteries with the exception of 20% proximal D1    Home Medications:    Medication List       This list is accurate as of: 01/25/16 11:59 PM.  Always use your most recent med list.               acetaminophen 500 MG tablet  Commonly known as:  TYLENOL  Take by mouth.     amoxicillin-clavulanate 875-125 MG tablet  Commonly known as:  AUGMENTIN  Take 1 tablet by mouth every 12 (twelve) hours.     aspirin EC 81 MG tablet  Take 1 tablet (81 mg total) by mouth daily.     b complex vitamins tablet  Take 1 tablet by mouth daily.     ciprofloxacin-fluocinolone PF 0.3-0.025 % Soln  Commonly known as:  OTOVEL  Place 0.25 mLs in ear(s) 2  (two) times daily.     [employer] VITAMIN B12 2000 MCG tablet  Generic drug:  cyanocobalamin  Take 2,000 mcg by mouth daily.     dicyclomine 10 MG capsule  Commonly known as:  BENTYL  Take by mouth.     ferrous sulfate 325 (65 FE) MG tablet  Take 1 tablet (325 mg total) by mouth daily with breakfast.     fluticasone 50 MCG/ACT nasal spray  Commonly known as:  FLONASE  SPRAY 2 SPRAYS EACH INTO BOTH NOSTRILS ONCE DAILY EACH NIGHT.     GAS-X EXTRA STRENGTH 125 MG Caps  Generic drug:  Simethicone  Take 1 capsule by mouth as needed. Reported on 11/24/2015     GENTEAL OP  Apply 1 drop to eye. Each eye as needed     hydrocortisone 25 MG suppository  Commonly known as:  ANUSOL-HC  Place 1 suppository (25 mg total) rectally 2 (two) times daily.     loperamide 2 MG tablet  Commonly known as:  IMODIUM A-D  Take 2 mg by mouth 4 (four) times daily as needed for diarrhea or loose stools.     meclizine 25 MG tablet  Commonly known as:  ANTIVERT  TAKE ONE TO TWO TABLETS BY MOUTH EVERY 8 HOURS AS NEEDED     metFORMIN 500 MG tablet  Commonly known as:  GLUCOPHAGE  Take 1 tablet (500 mg total) by mouth 2 (two) times daily with a meal.     neomycin-polymyxin-hydrocortisone 3.5-10000-1 otic suspension  Commonly known as:  CORTISPORIN  Reported on 01/05/2016     nystatin cream  Commonly known as:  MYCOSTATIN  Apply 1 application topically 2 (two) times daily.     nystatin cream  Commonly known as:  MYCOSTATIN  Apply 1 application topically 2 (two) times daily.     omeprazole 40 MG capsule  Commonly known as:  PRILOSEC  Take 40 mg by mouth daily.     ondansetron 4 MG tablet  Commonly known as:  ZOFRAN  Take 4 mg by mouth every 8 (eight) hours as needed for nausea or vomiting. Reported on 12/22/2015     polyethylene glycol packet  Commonly known as:  MIRALAX / GLYCOLAX  Take 17 g by mouth daily as needed. Reported on 11/15/2015     PROBIOTIC COLON SUPPORT Caps  Take 1 capsule by mouth  daily.     rosuvastatin 5 MG tablet  Commonly known as:  CRESTOR  Take 1 tablet (5 mg total)  by mouth daily at 6 PM.     sertraline 50 MG tablet  Commonly known as:  ZOLOFT  Take 1 tablet (50 mg total) by mouth daily.     URIBEL 118 MG Caps  ONE TABLET FOUR TIMES DAILY AS NEEDED WITH 8 OZ OF WATER     VITAMIN D-1000 MAX ST 1000 units tablet  Generic drug:  Cholecalciferol  Take 1,000 Units by mouth daily. Reported on 01/25/2016     Vitamin D3 50000 units Caps  Take 5,000 Int'l Units/day by mouth daily. Reported on 01/25/2016     Vitamins A & D 5000-400 units Caps  Take 1 capsule by mouth daily. Reported on 12/29/2015        Allergies:  Allergies  Allergen Reactions  . Biaxin [Clarithromycin] Other (See Comments) and Diarrhea  . Copaxone [Glatiramer Acetate]   . Decadron [Dexamethasone] Nausea And Vomiting and Other (See Comments)    Other reaction(s): Muscle Pain Reaction:  Abdominal pain  . Dexamethasone Sodium Phosphate Other (See Comments)  . Diltiazem Hcl Other (See Comments)    Reaction:  Unknown   . Diltiazem Hcl Other (See Comments)  . Flagyl [Metronidazole] Nausea Only and Diarrhea    Other reaction(s): Vomiting  . Gabapentin Other (See Comments)    "burning all over" Reaction:  Unknown   . Iohexol Swelling and Other (See Comments)     Desc: tongue swelling with "IVP dye" sccording to nurses notes with a lumbar myelo-12/09- asm, Onset Date: [id number]  Pts tongue swells.  Clementeen Hoof [Iodinated Diagnostic Agents] Swelling and Other (See Comments)    Passed out  Pts tongue swells.   . Levothyroxine Nausea Only  . Sulfa Antibiotics Other (See Comments)    Reaction:  Unknown   . Sulfonamide Derivatives Other (See Comments)    Reaction:  Unknown   . Synthroid [Levothyroxine Sodium]   . Copaxone  [Glatiramer] Rash  . Interferon Beta-1a Other (See Comments) and Rash    Reaction:  Unknown     Family History: Family History  Problem Relation Age of Onset  .  Arthritis Sister   . Aneurysm Brother   . Heart disease Daughter   . ALS Mother   . Colon cancer    . Kidney disease Sister     Social History:  reports that she has never smoked. She has never used smokeless tobacco. She reports that she does not drink alcohol or use illicit drugs.  ROS: UROLOGY Frequent Urination?: No Hard to postpone urination?: Yes Burning/pain with urination?: Yes Get up at night to urinate?: Yes Leakage of urine?: Yes Urine stream starts and stops?: No Trouble starting stream?: No Do you have to strain to urinate?: No Blood in urine?: No Urinary tract infection?: No Sexually transmitted disease?: No Injury to kidneys or bladder?: No Painful intercourse?: No Weak stream?: No Currently pregnant?: No Vaginal bleeding?: No Last menstrual period?: n  Gastrointestinal Nausea?: No Vomiting?: No Indigestion/heartburn?: No Diarrhea?: No Constipation?: No  Constitutional Fever: No Night sweats?: No Weight loss?: No Fatigue?: No  Skin Skin rash/lesions?: No Itching?: No  Eyes Blurred vision?: No Double vision?: No  Ears/Nose/Throat Sore throat?: No Sinus problems?: Yes  Hematologic/Lymphatic Swollen glands?: No Easy bruising?: No  Cardiovascular Leg swelling?: No Chest pain?: No  Respiratory Cough?: No Shortness of breath?: No  Endocrine Excessive thirst?: No  Musculoskeletal Back pain?: Yes Joint pain?: Yes  Neurological Headaches?: Yes Dizziness?: Yes  Psychologic Depression?: Yes Anxiety?: Yes  Physical Exam: Blood pressure  151/73, pulse 72, height 5' (1.524 m), weight 160 lb (72.576 kg). Constitutional: Well nourished. Alert and oriented, No acute distress. HEENT: [place] AT, moist mucus membranes. Trachea midline, no masses. Cardiovascular: No clubbing, cyanosis, or edema. Respiratory: Normal respiratory effort, no increased work of breathing. GI: Abdomen is soft, non tender, non distended, no abdominal masses.    GU: No CVA tenderness.  No bladder fullness or masses.   Skin: No rashes, bruises or suspicious lesions. Lymph: No cervical or inguinal adenopathy. Neurologic: Grossly intact, no focal deficits, moving all 4 extremities. Psychiatric: Normal mood and affect.  Laboratory Data: Lab Results  Component Value Date   WBC 4.3 10/31/2015   HGB 13.6 10/31/2015   HCT 40.9 10/31/2015   MCV 91.0 10/31/2015   PLT 190 10/31/2015   Lab Results  Component Value Date   CREATININE 0.98 12/28/2015   Lab Results  Component Value Date   HGBA1C 7.3* 12/28/2015    Urinalysis: Results for orders placed or performed in visit on 01/25/16  Microscopic Examination  Result Value Ref Range   WBC, UA 6-10 (A) 0 -  5 /hpf   RBC, UA 0-2 0 -  2 /hpf   Epithelial Cells (non renal) 0-10 0 - 10 /hpf   Mucus, UA Present (A) Not Estab.   Bacteria, UA Few (A) None seen/Few  Urinalysis, Complete  Result Value Ref Range   Specific Gravity, UA 1.025 1.005 - 1.030   pH, UA 5.0 5.0 - 7.5   Color, UA Yellow Yellow   Appearance Ur Clear Clear   Leukocytes, UA Trace (A) Negative   Protein, UA Trace (A) Negative/Trace   Glucose, UA Negative Negative   Ketones, UA Negative Negative   RBC, UA Negative Negative   Bilirubin, UA Negative Negative   Urobilinogen, Ur 0.2 0.2 - 1.0 mg/dL   Nitrite, UA Negative Negative   Microscopic Examination See below:    Assessment & Plan:    1. Interstitial cystitis:  Patient's rescue installation is performed today.  Patient is still not wanting to taper her instillations at this time.  She will return next week for her next treatment.    2. Recurrent UTI's:   UA at baseline.    3. Microscopic hematuria:   Cystoscopy completed on 01/04/2016 was negative.  No microscopic hematuria on today's UA.    4. Falling:   Patient has been evaluated by cardiology and they do not feel the falls other results of a cardiac issue.  We will need to assist the patient with undressing and  getting on exam tables at this time.   Return in about 1 week (around 02/01/2016) for rescue solution.  Zara Council, [facility] [address], [place], [place] [address] [phone]

## 2016-01-31 ENCOUNTER — Ambulatory Visit: Payer: Commercial Managed Care - HMO

## 2016-01-31 DIAGNOSIS — R2681 Unsteadiness on feet: Secondary | ICD-10-CM

## 2016-01-31 DIAGNOSIS — M6281 Muscle weakness (generalized): Secondary | ICD-10-CM | POA: Diagnosis not present

## 2016-01-31 NOTE — Therapy (Signed)
[place] MAIN [facility] SERVICES [address] [place], [place], [address] Phone: [phone]   Fax:  [phone]  Physical Therapy Treatment  Patient Details  Name: [name] MRN: [id number] Date of Birth: 04-Apr-1930 Referring Provider: Sanda Klein  Encounter Date: 01/31/2016      PT End of Session - 01/31/16 1716    Visit Number 12   Number of Visits 21   Date for PT Re-Evaluation 02/16/16   Authorization Type 3/10   PT Start Time 1600   PT Stop Time 1645   PT Time Calculation (min) 45 min   Equipment Utilized During Treatment Gait belt   Activity Tolerance Patient tolerated treatment well   Behavior During Therapy [facility] for tasks assessed/performed      Past Medical History  Diagnosis Date  . GERD (gastroesophageal reflux disease)   . Vertigo   . Essential hypertension, benign   . Paroxysmal supraventricular tachycardia ([place])   . Pure hypercholesterolemia   . MS (multiple sclerosis) ([place])   . Fibromyalgia   . Diverticulosis   . Diabetes mellitus without complication ([place])   . Osteoarthritis   . Dyslipidemia   . SOB (shortness of breath)     SECONDARY TO REFLUX  . Chest pain     SECONDARY TO GERD  . Peripheral neuropathy (HCC)     MILD  . Cancer ([place])     breast  . Depression   . Chronic interstitial cystitis   . History of fractured rib   . Chronic right hip pain   . History of TIA (transient ischemic attack)   . Carpal tunnel syndrome   . Thyroid nodule   . Hearing loss of left ear   . Vitamin B 12 deficiency   . Protein malnutrition ([place])   . Frequent falls   . Anxiety and depression   . Irritable bowel   . Recurrent UTI   . Solitary pulmonary nodule on lung CT 12/04/2015    3 mm LLL lung nodule June 2016, March 2017  . Tachycardia, paroxysmal (HCC)     Reportedly Paroxysmal Supraventricular Tachycardia, but unable to find documentation confirming this    Past Surgical History  Procedure Laterality Date  .  Total abdominal hysterectomy w/ bilateral salpingoophorectomy    . Mastectomy Right     DX MASTITIS/NO CANCER.Marland KitchenSALINE IMPLANT  . Cholecystectomy    . Hemorrhoid surgery      WITH RECONSTRUCTION  . Bladder tacking      X 3  . Eye surgeries      WITH BUCKLE DETACHMENT OF THE RETINA  . Tonsillectomy    . Appendectomy    . Tympanoplasty    . Breast surgery    . Mastectomy    . Breast lumpectomy    . Colonoscopy with propofol N/A 01/21/2015    Procedure: COLONOSCOPY WITH PROPOFOL;  Surgeon: Josefine Class, MD;  Location: [facility] ENDOSCOPY;  Service: Endoscopy;  Laterality: N/A;  . Esophagogastroduodenoscopy N/A 01/21/2015    Procedure: ESOPHAGOGASTRODUODENOSCOPY (EGD);  Surgeon: Josefine Class, MD;  Location: [facility] ENDOSCOPY;  Service: Endoscopy;  Laterality: N/A;  . Cardiac catheterization  10/2001    Normal coronary arteries with the exception of 20% proximal D1    There were no vitals filed for this visit.      Subjective Assessment - 01/31/16 1715    Subjective pt reports she almost fell but caught herself    Patient is accompained by: Family member   Limitations Walking;House hold  activities   Patient Stated Goals "to not fall as much, to decrease pain, to be less dizzy."   Currently in Pain? No/denies   Pain Onset More than a month ago       NMR: cone savenger hunt around room with forward bend to pick up 2 x 3 min , min cues to promote knee flexion and get closer to cone. SBA for safety AP tilt no UE on small rocker board 2 x 2 min Static balance AP on smaller rocker board 2 x 2 min (no UE) Fwd step over/ back over line 2 x 10 each leg no Ue Side step over / back over line 2 x 10 each leg no Ue Standing NBOS balloon hit 3 x 90s, CGA for safety Alt. Ball kick x 10 each leg no UE CGA for safety                           PT Education - 01/31/16 1716    Education provided Yes   Education Details attend to task, get closer to object that you are  picking up from floor    Person(s) Educated Patient   Methods Explanation   Comprehension Verbalized understanding             PT Long Term Goals - 01/19/16 1426    PT LONG TERM GOAL #1   Title pt will improve berg balance score by 6pts to demo improved balance    Time 6   Period Weeks   Status Achieved   PT LONG TERM GOAL #2   Title Patient will demonstrate improved safety by improving gait speed to 10 meters in < 20 sec.    Baseline 26 sec   Time 6   Period Weeks   Status Partially Met   PT LONG TERM GOAL #3   Title Patient will demonstrate improved mobility by performing 5x STS in < 20 sec.    Baseline 23.29 sec (01/19/16)   Time 6   Period Weeks   Status Partially Met   PT LONG TERM GOAL #4   Title Pt will improve BERG balance score by >9 points to demonstrate improved balance and decrease risk of falls.    Baseline 37   Time 6   Period Weeks   Status New   PT LONG TERM GOAL #5   Title Pt will improve 10MWT by >0.13ms with rollator to demonstrate improved overall function and limited community ambulation.   Baseline 0.55 m/s   Time 6   Period Weeks   Status New   Additional Long Term Goals   Additional Long Term Goals Yes   PT LONG TERM GOAL #6   Title Pt will ambulate 12 stairs with RUE support with supervision in order for her to enter her daughter's house.   Time 6   Period Weeks   Status New               Plan - 01/31/16 1718    Clinical Impression Statement pt did well with progression of more dynamic balance activity and now progressed to multitasking activities which were a challenge for her mentally and visually. pt wound benefit from continued skilled PT services to progress balance, gait as tolerated. pt also picked up items from floor multiple times safely, but did need education to get close enough to the object at hand to reduce forward COG shfit    Rehab Potential Fair  Clinical Impairments Affecting Rehab Potential co-morbidities     PT Frequency 2x / week   PT Duration 6 weeks   PT Treatment/Interventions Moist Heat;Therapeutic exercise;Therapeutic activities;Gait training;Neuromuscular re-education;Balance training;Aquatic Therapy;Patient/family education;Manual techniques;Energy conservation;Vestibular;ADLs/Self Care Home Management;Canalith Repostioning;Cryotherapy;Electrical Stimulation   PT Next Visit Plan Continue balance and LE strengthening   Consulted and Agree with Plan of Care Patient      Patient will benefit from skilled therapeutic intervention in order to improve the following deficits and impairments:  Abnormal gait, Decreased activity tolerance, Decreased balance, Decreased strength, Decreased mobility, Difficulty walking, Dizziness, Pain, Decreased safety awareness, Decreased endurance, Decreased range of motion  Visit Diagnosis: Unsteadiness on feet  Muscle weakness (generalized)     Problem List Patient Active Problem List   Diagnosis Date Noted  . IC (interstitial cystitis) 12/12/2015  . Solitary pulmonary nodule on lung CT 12/04/2015  . Abnormal CAT scan 11/21/2015  . Generalized degenerative joint disease of hand 11/21/2015  . Triggering of digit 11/21/2015  . Snapping thumb syndrome 11/21/2015  . Pain, joint, hand 11/11/2015  . Coronary artery calcification seen on CAT scan 11/11/2015  . Multiple sclerosis ([place]) 11/11/2015  . Dizziness 11/11/2015  . Interstitial cystitis 10/24/2015  . Diabetic neuropathy with neurologic complication ([place]) 37/94/3276  . Chronic pain of multiple joints 09/02/2015  . Right lower quadrant abdominal pain 08/02/2015  . Microscopic hematuria 07/12/2015  . Recurrent UTI 04/09/2015  . Dysuria 04/01/2015  . Carpal tunnel syndrome 01/12/2014  . DD (diverticular disease) 01/12/2014  . Carcinoma in situ, breast, ductal 01/12/2014  . H/O surgical procedure 01/12/2014  . H/O right mastectomy 01/12/2014  . Hypercholesteremia 01/12/2014  . DS (disseminated  sclerosis) ([place]) 01/12/2014  . Arthritis, degenerative 01/12/2014  . Thyroid nodule 01/12/2014  . Cyanocobalamine deficiency (non anemic) 01/12/2014  . Anxiety and depression 01/12/2014  . Diabetes mellitus ([place]) 01/12/2014  . Intraductal carcinoma of breast 01/12/2014  . History of surgical procedure 01/12/2014  . History of right mastectomy 01/12/2014  . Hypercholesterolemia 01/12/2014  . DM (diabetes mellitus) type II controlled, neurological manifestation ([place]) 06/26/2010  . Depression, major, recurrent, in partial remission ([place]) 06/26/2010  . Frequent falls 06/26/2010  . Stricture and stenosis of esophagus 06/26/2010  . Gastro-esophageal reflux disease without esophagitis 06/26/2010  . Fibromyalgia 06/26/2010  . Chronic interstitial cystitis 06/26/2010  . H/O malignant neoplasm of breast 06/26/2010  . Major depressive disorder with single episode ([place]) 06/26/2010  . Controlled type 2 diabetes mellitus without complication ([place]) 14/70/9295  . Hypertension goal BP (blood pressure) < 140/90 06/01/2009  . H/O paroxysmal supraventricular tachycardia 06/01/2009  . Paroxysmal supraventricular tachycardia ([place]) 06/01/2009  . Irritable bowel syndrome with constipation and diarrhea 06/16/2008  . Diarrhea, functional 06/16/2008  . D (diarrhea) 06/16/2008   Gorden Harms. Dajanique Robley, PT, DPT [phone]  Diago Haik 01/31/2016, 5:21 PM  [place] MAIN [facility] SERVICES [address] [place], [place], [address] Phone: [phone]   Fax:  [phone]  Name: [name] MRN: [id number] Date of Birth: 07/03/1930

## 2016-02-01 ENCOUNTER — Encounter: Payer: Self-pay | Admitting: Urology

## 2016-02-01 ENCOUNTER — Ambulatory Visit (INDEPENDENT_AMBULATORY_CARE_PROVIDER_SITE_OTHER): Payer: Commercial Managed Care - HMO | Admitting: Urology

## 2016-02-01 VITALS — BP 156/92 | HR 75 | Ht 63.0 in | Wt 159.7 lb

## 2016-02-01 DIAGNOSIS — N3010 Interstitial cystitis (chronic) without hematuria: Secondary | ICD-10-CM | POA: Diagnosis not present

## 2016-02-01 LAB — URINALYSIS, COMPLETE
BILIRUBIN UA: NEGATIVE
GLUCOSE, UA: NEGATIVE
Ketones, UA: NEGATIVE
NITRITE UA: NEGATIVE
PH UA: 5.5 (ref 5.0–7.5)
Specific Gravity, UA: 1.025 (ref 1.005–1.030)
UUROB: 0.2 mg/dL (ref 0.2–1.0)

## 2016-02-01 LAB — MICROSCOPIC EXAMINATION: Epithelial Cells (non renal): >10 /hpf — AB (ref 0–10)

## 2016-02-01 MED ORDER — SODIUM BICARBONATE 8.4 % IV SOLN
11.0000 mL | Freq: Once | INTRAVENOUS | Status: AC
  Administered 2016-02-01: 11 mL

## 2016-02-01 MED ORDER — LIDOCAINE HCL 2 % EX GEL
1.0000 "application " | Freq: Once | CUTANEOUS | Status: AC
  Administered 2016-02-01: 1 via URETHRAL

## 2016-02-01 NOTE — Progress Notes (Signed)
Bladder Rescue Solution Instillation  Due to interstitial cystitis patient is present today for a Rescue Solution Treatment.  Patient was cleaned and prepped in a sterile fashion with betadine and lidocaine 2% jelly was instilled into the urethra.  A 14 FR catheter was inserted, urine return was noted 4ml, urine was blue in color due to medication Uribel.  Instilled a solution consisting of 21ml of Sodium Bicarb, 2 ml Lidocaine and 1 ml of Heparin. The catheter was then removed. Patient tolerated well, no complications were noted.   Performed by: Lyndee Hensen CMA  Follow up/ Additional Notes: One Week

## 2016-02-02 ENCOUNTER — Other Ambulatory Visit: Payer: Self-pay | Admitting: *Deleted

## 2016-02-02 ENCOUNTER — Other Ambulatory Visit: Payer: Self-pay

## 2016-02-02 ENCOUNTER — Encounter: Payer: Self-pay | Admitting: *Deleted

## 2016-02-02 DIAGNOSIS — H66001 Acute suppurative otitis media without spontaneous rupture of ear drum, right ear: Secondary | ICD-10-CM | POA: Diagnosis not present

## 2016-02-02 MED ORDER — MECLIZINE HCL 25 MG PO TABS: 25.0000 mg | ORAL_TABLET | Freq: Three times a day (TID) | ORAL | Status: DC | PRN

## 2016-02-02 NOTE — Patient Outreach (Addendum)
[facility] [place]) Care Management  [place]  02/02/2016   [name] [birth year] [id number]  Subjective: RN Health Coach telephone call to patient.  [place] compliance verified. Per patient she is still having near fall episodes. Her gait is still unsteady. Per patient she has not actually fallen lately she catches herself. Patient stated that she has a fear of falling.  Patient is in rehab to strengthen her legs.She is going twice a week. Patient is going to an ENT to decrease the fluid in ears to help with the dizziness. Per patient her ear is still draining despite having the tubes placed. Per patient she is having some pain in the ear. Patient  is going back to the ENT.  Per patient she is not moving  From [place] as they had planned . Patient stated she has not had any hypo or hyperglycemia episodes. Patient stated she had been following her diet pretty good but every once in  a while she has a piece of cake.  Patient A1C is 7.3.  Her blood sugar  range has been running 137-142. Marland Kitchen Patient fasting blood sugar was 137.  Patient has agreed to follow up outreach calls.  Objective:   Encounter Medications:  Outpatient Encounter Prescriptions as of 02/02/2016  Medication Sig Note  . acetaminophen (TYLENOL) 500 MG tablet Take by mouth. 12/29/2015: Received from: [place]  . amoxicillin-clavulanate (AUGMENTIN) 875-125 MG tablet Take 1 tablet by mouth every 12 (twelve) hours. (Patient not taking: Reported on 02/02/2016)   . aspirin EC 81 MG tablet Take 1 tablet (81 mg total) by mouth daily.   Marland Kitchen b complex vitamins tablet Take 1 tablet by mouth daily.   . Carboxymethylcell-Hypromellose (GENTEAL OP) Apply 1 drop to eye. Each eye as needed   . Cholecalciferol (VITAMIN D-1000 MAX ST) 1000 UNITS tablet Take 1,000 Units by mouth daily. Reported on 02/01/2016 08/15/2015: Takes 1 capsule daily at bedtime  . Cholecalciferol (VITAMIN D3) 50000 units CAPS Take 5,000  Int'l Units/day by mouth daily. Reported on 01/25/2016   . ciprofloxacin-fluocinolone PF (OTOVEL) 0.3-0.025 % SOLN Place 0.25 mLs in ear(s) 2 (two) times daily.   . cyanocobalamin ([employer] VITAMIN B12) 2000 MCG tablet Take 2,000 mcg by mouth daily.  08/15/2015: Takes 1 tablet daily in the morning  . dicyclomine (BENTYL) 10 MG capsule Take by mouth. 12/29/2015: Received from: [facility]  . ferrous sulfate 325 (65 FE) MG tablet Take 1 tablet (325 mg total) by mouth daily with breakfast. (Patient taking differently: Take 325 mg by mouth as needed. )   . fluticasone (FLONASE) 50 MCG/ACT nasal spray SPRAY 2 SPRAYS EACH INTO BOTH NOSTRILS ONCE DAILY EACH NIGHT. 10/24/2015: Received from: External Pharmacy  . hydrocortisone (ANUSOL-HC) 25 MG suppository Place 1 suppository (25 mg total) rectally 2 (two) times daily.   Marland Kitchen loperamide (IMODIUM A-D) 2 MG tablet Take 2 mg by mouth 4 (four) times daily as needed for diarrhea or loose stools.   . meclizine (ANTIVERT) 25 MG tablet TAKE ONE TO TWO TABLETS BY MOUTH EVERY 8 HOURS AS NEEDED   . metFORMIN (GLUCOPHAGE) 500 MG tablet Take 1 tablet (500 mg total) by mouth 2 (two) times daily with a meal.   . Meth-Hyo-M Bl-Na Phos-Ph Sal (URIBEL) 118 MG CAPS ONE TABLET FOUR TIMES DAILY AS NEEDED WITH 8 OZ OF WATER   . neomycin-polymyxin-hydrocortisone (CORTISPORIN) 3.5-10000-1 otic suspension Reported on 01/05/2016 12/22/2015: Received from: External Pharmacy  . nystatin cream (  MYCOSTATIN) Apply 1 application topically 2 (two) times daily.   Marland Kitchen nystatin cream (MYCOSTATIN) Apply 1 application topically 2 (two) times daily.   Marland Kitchen omeprazole (PRILOSEC) 40 MG capsule Take 40 mg by mouth daily.  11/30/2015: Received from: External Pharmacy  . ondansetron (ZOFRAN) 4 MG tablet Take 4 mg by mouth every 8 (eight) hours as needed for nausea or vomiting. Reported on 12/22/2015 01/12/2016: PRN  . polyethylene glycol (MIRALAX / GLYCOLAX) packet Take 17 g by mouth daily as needed.  Reported on 11/15/2015   . Probiotic Product (PROBIOTIC COLON SUPPORT) CAPS Take 1 capsule by mouth daily.  08/15/2015: Takes 1 capsule by mouth daily at night  . rosuvastatin (CRESTOR) 5 MG tablet Take 1 tablet (5 mg total) by mouth daily at 6 PM.   . sertraline (ZOLOFT) 50 MG tablet Take 1 tablet (50 mg total) by mouth daily.   . Simethicone (GAS-X EXTRA STRENGTH) 125 MG CAPS Take 1 capsule by mouth as needed. Reported on 11/24/2015   . solifenacin (VESICARE) 5 MG tablet Take 5 mg by mouth daily.   . Vitamins A & D 5000-400 units CAPS Take 1 capsule by mouth daily. Reported on 12/29/2015 10/24/2015: Received from: External Pharmacy   No facility-administered encounter medications on file as of 02/02/2016.    Functional Status:  In your present state of health, do you have any difficulty performing the following activities: 02/02/2016 11/10/2015  Hearing? [name]  Vision? Y Y  Difficulty concentrating or making decisions? N Y  Walking or climbing stairs? Y Y  Dressing or bathing? N Y  Doing errands, shopping? [name]  Preparing Food and eating ? Y Y  Using the Toilet? N N  In the past six months, have you accidently leaked urine? Y Y  Do you have problems with loss of bowel control? N Y  Managing your Medications? N Y  Managing your Finances? N Y  Housekeeping or managing your Housekeeping? Y N    Fall/Depression Screening: PHQ 2/9 Scores 02/02/2016 12/06/2015 11/25/2015 11/10/2015 10/14/2015 10/12/2015 08/12/2015  PHQ - 2 Score 1 0 1 1 1 1 2   PHQ- 9 Score - - - - - - 12   THN CM Care Plan Problem One        Most Recent Value   Care Plan Problem One  knowledge deficit in self management of diabetes   Role Documenting the Problem One  Health [place] for Problem One  Active   THN Long Term Goal (31-90 days)  Patient will continue not have any admissions for diabetes in the next 90 days   THN Long Term Goal Start Date  02/02/16   Interventions for Problem One Long Term Goal  RN reminded patient to  keep all apoointments with PCP and other caregivers. RN reminded patient the importance of taking all medication as prescribed.   THN CM Short Term Goal #1 (0-30 days)  Patient will verbalized the symptoms of hypo and hyperglycemia within the next 30 days.    THN CM Short Term Goal #1 Start Date  02/02/16   Interventions for Short Term Goal #1  RN discussed hypo and hyperglycemia symptoms with patient. RN will send picture chart of hypo and hyperglycemia symptoms and action plan. RN will follow up with discussion and teach back with a month.   THN CM Short Term Goal #2 (0-30 days)  Patient will report checking blood sugar daily and recording results with the next 30 days  THN CM Short Term Goal #2 Start Date  02/02/16 [continue to make sure that patient is following up with taking her blood sugars since Health Coach sent a community nurse to work with assisting patient to utilize her meter.]   Interventions for Short Term Goal #2  RN reminded the patient of checking her blood sugar each morning. RN explained to patient to write it down with a color marker since patient having some difficult seeing it.   THN CM Short Term Goal #3 (0-30 days)  Patient will not have any falls within the next 30 days   THN CM Short Term Goal #3 Start Date  02/02/16 [continue/ Patient is in rehab working on strnghten body but her gait is still unsteady]   Interventions for Short Tern Goal #3  RN discussed with patient about fall safety. Per patient she has fluid in ear that is causing her balance to be off. Patient couldn't remember the name of ENT she had seen. RN called ENT from patient description and verified . RN called patient back and gave her name and number to set up an appointment       Assessment:  Patient will continue to  benefit from Health Coach telephonic outreach for education and support for diabetes self management.  Plan:  RN will send educational material on healthy snacks RN will send patient  educational material on creating a Healthy Meal Plan RN will follow up within 30 days to discuss and teach back and follow progress with ENT and Rehab.  [place] Care Management [phone]

## 2016-02-04 NOTE — Progress Notes (Signed)
5:30 PM   [name] 10-28-1929 [id number]  Referring provider: Arnetha Courser, MD [address] [place], [place] [address]  Chief Complaint  Patient presents with  . Cystitis    Rescue solution    HPI: Patient is an 82 year old Caucasian female with a history of IC who presents today for rescue installation.     Patient had been experiencing falls recently. She has been evaluated by cardiology and they feel that this is not of a cardiac nature.   She is currently working with physical therapist.    Patient had a CT abdomen and pelvis wo contrast on 08/03/2015 which noted a stable hyperdense cyst in the left kidney.  A cystoscopy on 01/04/2016 which was negative.   Today, she states she is experiencing suprapubic discomfort, urgency and nocturia.  She states that the suprapubic discomfort began 2 days ago.  She denies fever, chills, nausea or vomiting.  Her UA today is unremarkable.      PMH: Past Medical History  Diagnosis Date  . GERD (gastroesophageal reflux disease)   . Vertigo   . Essential hypertension, benign   . Paroxysmal supraventricular tachycardia ([place])   . Pure hypercholesterolemia   . MS (multiple sclerosis) ([place])   . Fibromyalgia   . Diverticulosis   . Diabetes mellitus without complication ([place])   . Osteoarthritis   . Dyslipidemia   . SOB (shortness of breath)     SECONDARY TO REFLUX  . Chest pain     SECONDARY TO GERD  . Peripheral neuropathy (HCC)     MILD  . Cancer ([place])     breast  . Depression   . Chronic interstitial cystitis   . History of fractured rib   . Chronic right hip pain   . History of TIA (transient ischemic attack)   . Carpal tunnel syndrome   . Thyroid nodule   . Hearing loss of left ear   . Vitamin B 12 deficiency   . Protein malnutrition ([place])   . Frequent falls   . Anxiety and depression   . Irritable bowel   . Recurrent UTI   . Solitary pulmonary nodule on lung CT 12/04/2015    3 mm LLL lung  nodule June 2016, March 2017  . Tachycardia, paroxysmal (HCC)     Reportedly Paroxysmal Supraventricular Tachycardia, but unable to find documentation confirming this    Surgical History: Past Surgical History  Procedure Laterality Date  . Total abdominal hysterectomy w/ bilateral salpingoophorectomy    . Mastectomy Right     DX MASTITIS/NO CANCER.Marland KitchenSALINE IMPLANT  . Cholecystectomy    . Hemorrhoid surgery      WITH RECONSTRUCTION  . Bladder tacking      X 3  . Eye surgeries      WITH BUCKLE DETACHMENT OF THE RETINA  . Tonsillectomy    . Appendectomy    . Tympanoplasty    . Breast surgery    . Mastectomy    . Breast lumpectomy    . Colonoscopy with propofol N/A 01/21/2015    Procedure: COLONOSCOPY WITH PROPOFOL;  Surgeon: Josefine Class, MD;  Location: [facility] ENDOSCOPY;  Service: Endoscopy;  Laterality: N/A;  . Esophagogastroduodenoscopy N/A 01/21/2015    Procedure: ESOPHAGOGASTRODUODENOSCOPY (EGD);  Surgeon: Josefine Class, MD;  Location[facility] ENDOSCOPY;  Service: Endoscopy;  Laterality: N/A;  . Cardiac catheterization  10/2001    Normal coronary arteries with the exception of 20% proximal D1  Home Medications:    Medication List       This list is accurate as of: 02/01/16 11:59 PM.  Always use your most recent med list.               acetaminophen 500 MG tablet  Commonly known as:  TYLENOL  Take by mouth.     amoxicillin-clavulanate 875-125 MG tablet  Commonly known as:  AUGMENTIN  Take 1 tablet by mouth every 12 (twelve) hours.     aspirin EC 81 MG tablet  Take 1 tablet (81 mg total) by mouth daily.     b complex vitamins tablet  Take 1 tablet by mouth daily.     ciprofloxacin-fluocinolone PF 0.3-0.025 % Soln  Commonly known as:  OTOVEL  Place 0.25 mLs in ear(s) 2 (two) times daily.     [employer] VITAMIN B12 2000 MCG tablet  Generic drug:  cyanocobalamin  Take 2,000 mcg by mouth daily.     dicyclomine 10 MG capsule  Commonly known as:  BENTYL  Take  by mouth.     ferrous sulfate 325 (65 FE) MG tablet  Take 1 tablet (325 mg total) by mouth daily with breakfast.     fluticasone 50 MCG/ACT nasal spray  Commonly known as:  FLONASE  SPRAY 2 SPRAYS EACH INTO BOTH NOSTRILS ONCE DAILY EACH NIGHT.     GAS-X EXTRA STRENGTH 125 MG Caps  Generic drug:  Simethicone  Take 1 capsule by mouth as needed. Reported on 11/24/2015     GENTEAL OP  Apply 1 drop to eye. Each eye as needed     hydrocortisone 25 MG suppository  Commonly known as:  ANUSOL-HC  Place 1 suppository (25 mg total) rectally 2 (two) times daily.     loperamide 2 MG tablet  Commonly known as:  IMODIUM A-D  Take 2 mg by mouth 4 (four) times daily as needed for diarrhea or loose stools.     meclizine 25 MG tablet  Commonly known as:  ANTIVERT  TAKE ONE TO TWO TABLETS BY MOUTH EVERY 8 HOURS AS NEEDED     metFORMIN 500 MG tablet  Commonly known as:  GLUCOPHAGE  Take 1 tablet (500 mg total) by mouth 2 (two) times daily with a meal.     neomycin-polymyxin-hydrocortisone 3.5-10000-1 otic suspension  Commonly known as:  CORTISPORIN  Reported on 01/05/2016     nystatin cream  Commonly known as:  MYCOSTATIN  Apply 1 application topically 2 (two) times daily.     nystatin cream  Commonly known as:  MYCOSTATIN  Apply 1 application topically 2 (two) times daily.     omeprazole 40 MG capsule  Commonly known as:  PRILOSEC  Take 40 mg by mouth daily.     ondansetron 4 MG tablet  Commonly known as:  ZOFRAN  Take 4 mg by mouth every 8 (eight) hours as needed for nausea or vomiting. Reported on 12/22/2015     polyethylene glycol packet  Commonly known as:  MIRALAX / GLYCOLAX  Take 17 g by mouth daily as needed. Reported on 11/15/2015     PROBIOTIC COLON SUPPORT Caps  Take 1 capsule by mouth daily.     rosuvastatin 5 MG tablet  Commonly known as:  CRESTOR  Take 1 tablet (5 mg total) by mouth daily at 6 PM.     sertraline 50 MG tablet  Commonly known as:  ZOLOFT  Take 1  tablet (50 mg total) by mouth daily.  solifenacin 5 MG tablet  Commonly known as:  VESICARE  Take 5 mg by mouth daily.     URIBEL 118 MG Caps  ONE TABLET FOUR TIMES DAILY AS NEEDED WITH 8 OZ OF WATER     VITAMIN D-1000 MAX ST 1000 units tablet  Generic drug:  Cholecalciferol  Take 1,000 Units by mouth daily. Reported on 02/01/2016     Vitamin D3 50000 units Caps  Take 5,000 Int'l Units/day by mouth daily. Reported on 01/25/2016     Vitamins A & D 5000-400 units Caps  Take 1 capsule by mouth daily. Reported on 12/29/2015        Allergies:  Allergies  Allergen Reactions  . Biaxin [Clarithromycin] Other (See Comments) and Diarrhea  . Copaxone [Glatiramer Acetate]   . Decadron [Dexamethasone] Nausea And Vomiting and Other (See Comments)    Other reaction(s): Muscle Pain Reaction:  Abdominal pain  . Dexamethasone Sodium Phosphate Other (See Comments)  . Diltiazem Hcl Other (See Comments)    Reaction:  Unknown   . Diltiazem Hcl Other (See Comments)  . Flagyl [Metronidazole] Nausea Only and Diarrhea    Other reaction(s): Vomiting  . Gabapentin Other (See Comments)    "burning all over" Reaction:  Unknown   . Iohexol Swelling and Other (See Comments)     Desc: tongue swelling with "IVP dye" sccording to nurses notes with a lumbar myelo-12/09- asm, Onset Date: 13244010  Pts tongue swells.  Clementeen Hoof [Iodinated Diagnostic Agents] Swelling and Other (See Comments)    Passed out  Pts tongue swells.   . Levothyroxine Nausea Only  . Sulfa Antibiotics Other (See Comments)    Reaction:  Unknown   . Sulfonamide Derivatives Other (See Comments)    Reaction:  Unknown   . Synthroid [Levothyroxine Sodium]   . Copaxone  [Glatiramer] Rash  . Interferon Beta-1a Other (See Comments) and Rash    Reaction:  Unknown     Family History: Family History  Problem Relation Age of Onset  . Arthritis Sister   . Aneurysm Brother   . Heart disease Daughter   . ALS Mother   . Colon cancer      . Kidney disease Sister     Social History:  reports that she has never smoked. She has never used smokeless tobacco. She reports that she does not drink alcohol or use illicit drugs.  ROS: UROLOGY Frequent Urination?: No Hard to postpone urination?: Yes Burning/pain with urination?: No Get up at night to urinate?: Yes Leakage of urine?: No Urine stream starts and stops?: No Trouble starting stream?: No Do you have to strain to urinate?: No Blood in urine?: No Urinary tract infection?: No Sexually transmitted disease?: No Injury to kidneys or bladder?: No Painful intercourse?: No Weak stream?: No Currently pregnant?: No Vaginal bleeding?: No Last menstrual period?: n  Gastrointestinal Nausea?: No Vomiting?: No Indigestion/heartburn?: Yes Diarrhea?: Yes Constipation?: Yes  Constitutional Fever: No Night sweats?: Yes Weight loss?: No Fatigue?: Yes  Skin Skin rash/lesions?: No Itching?: No  Eyes Blurred vision?: Yes Double vision?: No  Ears/Nose/Throat Sore throat?: No Sinus problems?: Yes  Hematologic/Lymphatic Swollen glands?: No Easy bruising?: No  Cardiovascular Leg swelling?: No Chest pain?: No  Respiratory Cough?: No Shortness of breath?: No  Endocrine Excessive thirst?: No  Musculoskeletal Back pain?: Yes Joint pain?: Yes  Neurological Headaches?: Yes Dizziness?: Yes  Psychologic Depression?: Yes Anxiety?: Yes  Physical Exam: Blood pressure 156/92, pulse 75, height '5\' 3"'$  (1.6 m), weight 159 lb 11.2  oz (72.439 kg). Constitutional: Well nourished. Alert and oriented, No acute distress. HEENT: [place] AT, moist mucus membranes. Trachea midline, no masses. Cardiovascular: No clubbing, cyanosis, or edema. Respiratory: Normal respiratory effort, no increased work of breathing. GI: Abdomen is soft, non tender, non distended, no abdominal masses.  GU: No CVA tenderness.  No bladder fullness or masses.   Skin: No rashes, bruises or  suspicious lesions. Lymph: No cervical or inguinal adenopathy. Neurologic: Grossly intact, no focal deficits, moving all 4 extremities. Psychiatric: Normal mood and affect.  Laboratory Data: Lab Results  Component Value Date   WBC 4.3 10/31/2015   HGB 13.6 10/31/2015   HCT 40.9 10/31/2015   MCV 91.0 10/31/2015   PLT 190 10/31/2015   Lab Results  Component Value Date   CREATININE 0.98 12/28/2015   Lab Results  Component Value Date   HGBA1C 7.3* 12/28/2015    Urinalysis: Results for orders placed or performed in visit on 02/01/16  Microscopic Examination  Result Value Ref Range   WBC, UA 0-5 0 -  5 /hpf   RBC, UA 0-2 0 -  2 /hpf   Epithelial Cells (non renal) >10 (A) 0 - 10 /hpf   Mucus, UA Present (A) Not Estab.   Bacteria, UA Few None seen/Few   Yeast, UA Present (A) None seen  Urinalysis, Complete  Result Value Ref Range   Specific Gravity, UA 1.025 1.005 - 1.030   pH, UA 5.5 5.0 - 7.5   Color, UA Green (A) Yellow   Appearance Ur Clear Clear   Leukocytes, UA 1+ (A) Negative   Protein, UA Trace (A) Negative/Trace   Glucose, UA Negative Negative   Ketones, UA Negative Negative   RBC, UA Trace (A) Negative   Bilirubin, UA Negative Negative   Urobilinogen, Ur 0.2 0.2 - 1.0 mg/dL   Nitrite, UA Negative Negative   Microscopic Examination See below:    Assessment & Plan:    1. Interstitial cystitis:  Patient's rescue installation is performed today.  Patient is still not wanting to taper her instillations at this time.  She will return next week for her next treatment.    2. Recurrent UTI's:   UA is normal today.     3. Microscopic hematuria:   Cystoscopy completed on 01/04/2016 was negative.  No microscopic hematuria on today's UA.    4. Falling:   Patient has been evaluated by cardiology and they do not feel the falls other results of a cardiac issue.  We will need to assist the patient with undressing and getting on exam tables at this time.   Return in about  1 week (around 02/08/2016) for Rescue solution.  Zara Council, [facility] [address] [place], [place] [address] [phone]

## 2016-02-08 ENCOUNTER — Ambulatory Visit: Payer: Commercial Managed Care - HMO

## 2016-02-08 DIAGNOSIS — M25561 Pain in right knee: Secondary | ICD-10-CM | POA: Diagnosis not present

## 2016-02-08 DIAGNOSIS — R030 Elevated blood-pressure reading, without diagnosis of hypertension: Secondary | ICD-10-CM | POA: Diagnosis not present

## 2016-02-09 ENCOUNTER — Emergency Department: Payer: Commercial Managed Care - HMO

## 2016-02-09 ENCOUNTER — Emergency Department
Admission: EM | Admit: 2016-02-09 | Discharge: 2016-02-09 | Disposition: A | Discharge status: Home / Self Care | Payer: Commercial Managed Care - HMO | Attending: Student | Admitting: Student

## 2016-02-09 ENCOUNTER — Encounter: Payer: Self-pay | Admitting: Emergency Medicine

## 2016-02-09 DIAGNOSIS — X501XXA Overexertion from prolonged static or awkward postures, initial encounter: Secondary | ICD-10-CM | POA: Insufficient documentation

## 2016-02-09 DIAGNOSIS — Z79899 Other long term (current) drug therapy: Secondary | ICD-10-CM | POA: Diagnosis not present

## 2016-02-09 DIAGNOSIS — M25561 Pain in right knee: Secondary | ICD-10-CM | POA: Diagnosis not present

## 2016-02-09 DIAGNOSIS — M1990 Unspecified osteoarthritis, unspecified site: Secondary | ICD-10-CM | POA: Insufficient documentation

## 2016-02-09 DIAGNOSIS — Z8673 Personal history of transient ischemic attack (TIA), and cerebral infarction without residual deficits: Secondary | ICD-10-CM | POA: Insufficient documentation

## 2016-02-09 DIAGNOSIS — I10 Essential (primary) hypertension: Secondary | ICD-10-CM | POA: Insufficient documentation

## 2016-02-09 DIAGNOSIS — S8391XA Sprain of unspecified site of right knee, initial encounter: Secondary | ICD-10-CM

## 2016-02-09 DIAGNOSIS — Z7982 Long term (current) use of aspirin: Secondary | ICD-10-CM | POA: Insufficient documentation

## 2016-02-09 DIAGNOSIS — Y999 Unspecified external cause status: Secondary | ICD-10-CM | POA: Insufficient documentation

## 2016-02-09 DIAGNOSIS — F3341 Major depressive disorder, recurrent, in partial remission: Secondary | ICD-10-CM | POA: Insufficient documentation

## 2016-02-09 DIAGNOSIS — E785 Hyperlipidemia, unspecified: Secondary | ICD-10-CM | POA: Diagnosis not present

## 2016-02-09 DIAGNOSIS — E119 Type 2 diabetes mellitus without complications: Secondary | ICD-10-CM | POA: Diagnosis not present

## 2016-02-09 DIAGNOSIS — Z853 Personal history of malignant neoplasm of breast: Secondary | ICD-10-CM | POA: Insufficient documentation

## 2016-02-09 DIAGNOSIS — Z7984 Long term (current) use of oral hypoglycemic drugs: Secondary | ICD-10-CM | POA: Insufficient documentation

## 2016-02-09 DIAGNOSIS — Y9389 Activity, other specified: Secondary | ICD-10-CM | POA: Diagnosis not present

## 2016-02-09 DIAGNOSIS — S8991XA Unspecified injury of right lower leg, initial encounter: Secondary | ICD-10-CM | POA: Diagnosis not present

## 2016-02-09 DIAGNOSIS — Y929 Unspecified place or not applicable: Secondary | ICD-10-CM | POA: Insufficient documentation

## 2016-02-09 MED ORDER — TRAMADOL HCL 50 MG PO TABS
50.0000 mg | ORAL_TABLET | Freq: Once | ORAL | Status: AC
Start: 2016-02-09 — End: 2016-02-09
  Administered 2016-02-09: 50 mg via ORAL
  Filled 2016-02-09: qty 1

## 2016-02-09 MED ORDER — TRAMADOL HCL 50 MG PO TABS: 50.0000 mg | ORAL_TABLET | Freq: Two times a day (BID) | ORAL | Status: DC | PRN

## 2016-02-09 NOTE — ED Notes (Signed)
Patient presents to the ED with right knee pain since "saturday or Sunday".  Patient states, "I turned around to sit down funny and it's been hurting ever since."

## 2016-02-09 NOTE — Discharge Instructions (Signed)
How to Use a Knee Brace °A knee brace is a device that you wear to support your knee, especially if the knee is healing after an injury or surgery. There are several types of knee braces. Some are designed to prevent an injury (prophylactic brace). These are often worn during sports. Others support an injured knee (functional brace) or keep it still while it heals (rehabilitative brace). People with severe arthritis of the knee may benefit from a brace that takes some pressure off the knee (unloader brace). Most knee braces are made from a combination of cloth and metal or plastic.  °You may need to wear a knee brace to: °· Relieve knee pain. °· Help your knee support your weight (improve stability). °· Help you walk farther (improve mobility). °· Prevent injury. °· Support your knee while it heals from surgery or from an injury. °RISKS AND COMPLICATIONS °Generally, knee braces are very safe to wear. However, problems may occur, including: °· Skin irritation that may lead to infection. °· Making your condition worse if you wear the brace in the wrong way. °HOW TO USE A KNEE BRACE °Different braces will have different instructions for use. Your health care provider will tell you or show you: °· How to put on your brace. °· How to adjust the brace. °· When and how often to wear the brace. °· How to remove the brace. °· If you will need any assistive devices in addition to the brace, such as crutches or a cane. °In general, your brace should: °· Have the hinge of the brace line up with the bend of your knee. °· Have straps, hooks, or tapes that fasten snugly around your leg. °· Not feel too tight or too loose.  °HOW TO CARE FOR A KNEE BRACE °· Check your brace often for signs of damage, such as loose connections or attachments. Your knee brace may get damaged or wear out during normal use. °· Wash the fabric parts of your brace with soap and water. °· Read the insert that comes with your brace for other specific care  instructions.  °SEEK MEDICAL CARE IF: °· Your knee brace is too loose or too tight and you cannot adjust it. °· Your knee brace causes skin redness, swelling, bruising, or irritation. °· Your knee brace is not helping. °· Your knee brace is making your knee pain worse. °  °This information is not intended to replace advice given to you by your health care provider. Make sure you discuss any questions you have with your health care provider. °  °Document Released: 10/13/2003 Document Revised: 04/13/2015 Document Reviewed: 11/15/2014 °[employer]vier Interactive Patient Education ©2016 [employer] ° °

## 2016-02-09 NOTE — ED Provider Notes (Signed)
[facility] Emergency Department Provider Note   ____________________________________________  Time seen: Approximately 10:16 AM  I have reviewed the triage vital signs and the nursing notes.   HISTORY  Chief Complaint Knee Pain    HPI Diane MATHIEU is a 82 y.o. female patient complain of 4 days of right knee pain secondary to a twisting incident. Patient states he felt a pop in her knee and has increased pain with ambulation since the incident. Patient states she's taking Tylenol and ibuprofen without any relief. Patient went to urgent care was told to come the emergency room for definitive evaluation and treatment. Patient is rating the pain as a 10 over 10. Patient described the pain as "sharp". No other palliative measures for this complaint   Past Medical History  Diagnosis Date  . GERD (gastroesophageal reflux disease)   . Vertigo   . Essential hypertension, benign   . Paroxysmal supraventricular tachycardia ([place])   . Pure hypercholesterolemia   . MS (multiple sclerosis) ([place])   . Fibromyalgia   . Diverticulosis   . Diabetes mellitus without complication ([place])   . Osteoarthritis   . Dyslipidemia   . SOB (shortness of breath)     SECONDARY TO REFLUX  . Chest pain     SECONDARY TO GERD  . Peripheral neuropathy (HCC)     MILD  . Cancer ([place])     breast  . Depression   . Chronic interstitial cystitis   . History of fractured rib   . Chronic right hip pain   . History of TIA (transient ischemic attack)   . Carpal tunnel syndrome   . Thyroid nodule   . Hearing loss of left ear   . Vitamin B 12 deficiency   . Protein malnutrition ([place])   . Frequent falls   . Anxiety and depression   . Irritable bowel   . Recurrent UTI   . Solitary pulmonary nodule on lung CT 12/04/2015    3 mm LLL lung nodule June 2016, March 2017  . Tachycardia, paroxysmal (HCC)     Reportedly Paroxysmal Supraventricular Tachycardia, but unable to find  documentation confirming this    Patient Active Problem List   Diagnosis Date Noted  . IC (interstitial cystitis) 12/12/2015  . Solitary pulmonary nodule on lung CT 12/04/2015  . Abnormal CAT scan 11/21/2015  . Generalized degenerative joint disease of hand 11/21/2015  . Triggering of digit 11/21/2015  . Snapping thumb syndrome 11/21/2015  . Pain, joint, hand 11/11/2015  . Coronary artery calcification seen on CAT scan 11/11/2015  . Multiple sclerosis ([place]) 11/11/2015  . Dizziness 11/11/2015  . Interstitial cystitis 10/24/2015  . Diabetic neuropathy with neurologic complication ([place]) [id number]  . Chronic pain of multiple joints 09/02/2015  . Right lower quadrant abdominal pain 08/02/2015  . Microscopic hematuria 07/12/2015  . Recurrent UTI 04/09/2015  . Dysuria 04/01/2015  . Carpal tunnel syndrome 01/12/2014  . DD (diverticular disease) 01/12/2014  . Carcinoma in situ, breast, ductal 01/12/2014  . H/O surgical procedure 01/12/2014  . H/O right mastectomy 01/12/2014  . Hypercholesteremia 01/12/2014  . DS (disseminated sclerosis) ([place]) 01/12/2014  . Arthritis, degenerative 01/12/2014  . Thyroid nodule 01/12/2014  . Cyanocobalamine deficiency (non anemic) 01/12/2014  . Anxiety and depression 01/12/2014  . Diabetes mellitus ([place]) 01/12/2014  . Intraductal carcinoma of breast 01/12/2014  . History of surgical procedure 01/12/2014  . History of right mastectomy 01/12/2014  . Hypercholesterolemia 01/12/2014  . DM (diabetes mellitus) type II controlled,  neurological manifestation ([place]) 06/26/2010  . Depression, major, recurrent, in partial remission ([place]) 06/26/2010  . Frequent falls 06/26/2010  . Stricture and stenosis of esophagus 06/26/2010  . Gastro-esophageal reflux disease without esophagitis 06/26/2010  . Fibromyalgia 06/26/2010  . Chronic interstitial cystitis 06/26/2010  . H/O malignant neoplasm of breast 06/26/2010  . Major depressive disorder with single episode  ([place]) 06/26/2010  . Controlled type 2 diabetes mellitus without complication ([place]) [id number]  . Hypertension goal BP (blood pressure) < 140/90 06/01/2009  . H/O paroxysmal supraventricular tachycardia 06/01/2009  . Paroxysmal supraventricular tachycardia ([place]) 06/01/2009  . Irritable bowel syndrome with constipation and diarrhea 06/16/2008  . Diarrhea, functional 06/16/2008  . D (diarrhea) 06/16/2008    Past Surgical History  Procedure Laterality Date  . Total abdominal hysterectomy w/ bilateral salpingoophorectomy    . Mastectomy Right     DX MASTITIS/NO CANCER.Marland KitchenSALINE IMPLANT  . Cholecystectomy    . Hemorrhoid surgery      WITH RECONSTRUCTION  . Bladder tacking      X 3  . Eye surgeries      WITH BUCKLE DETACHMENT OF THE RETINA  . Tonsillectomy    . Appendectomy    . Tympanoplasty    . Breast surgery    . Mastectomy    . Breast lumpectomy    . Colonoscopy with propofol N/A 01/21/2015    Procedure: COLONOSCOPY WITH PROPOFOL;  Surgeon: Josefine Class, MD;  Location[facility] ENDOSCOPY;  Service: Endoscopy;  Laterality: N/A;  . Esophagogastroduodenoscopy N/A 01/21/2015    Procedure: ESOPHAGOGASTRODUODENOSCOPY (EGD);  Surgeon: Josefine Class, MD;  Location: [facility] ENDOSCOPY;  Service: Endoscopy;  Laterality: N/A;  . Cardiac catheterization  10/2001    Normal coronary arteries with the exception of 20% proximal D1    Current Outpatient Rx  Name  Route  Sig  Dispense  Refill  . acetaminophen (TYLENOL) 500 MG tablet   Oral   Take by mouth.         Marland Kitchen amoxicillin-clavulanate (AUGMENTIN) 875-125 MG tablet   Oral   Take 1 tablet by mouth every 12 (twelve) hours. Patient not taking: Reported on 02/02/2016   14 tablet   0   . aspirin EC 81 MG tablet   Oral   Take 1 tablet (81 mg total) by mouth daily.   30 tablet   11   . b complex vitamins tablet   Oral   Take 1 tablet by mouth daily.         . Carboxymethylcell-Hypromellose (GENTEAL OP)   Ophthalmic   Apply 1  drop to eye. Each eye as needed         . Cholecalciferol (VITAMIN D-1000 MAX ST) 1000 UNITS tablet   Oral   Take 1,000 Units by mouth daily. Reported on 02/01/2016         . Cholecalciferol (VITAMIN D3) 50000 units CAPS   Oral   Take 5,000 Int'l Units/day by mouth daily. Reported on 01/25/2016         . ciprofloxacin-fluocinolone PF (OTOVEL) 0.3-0.025 % SOLN   Otic   Place 0.25 mLs in ear(s) 2 (two) times daily.         . cyanocobalamin ([employer] VITAMIN B12) 2000 MCG tablet   Oral   Take 2,000 mcg by mouth daily.          Marland Kitchen dicyclomine (BENTYL) 10 MG capsule   Oral   Take by mouth.         . ferrous sulfate 325 (65  FE) MG tablet   Oral   Take 1 tablet (325 mg total) by mouth daily with breakfast. Patient taking differently: Take 325 mg by mouth as needed.    90 tablet   1   . fluticasone (FLONASE) 50 MCG/ACT nasal spray      SPRAY 2 SPRAYS EACH INTO BOTH NOSTRILS ONCE DAILY EACH NIGHT.      5   . hydrocortisone (ANUSOL-HC) 25 MG suppository   Rectal   Place 1 suppository (25 mg total) rectally 2 (two) times daily.   12 suppository   0   . loperamide (IMODIUM A-D) 2 MG tablet   Oral   Take 2 mg by mouth 4 (four) times daily as needed for diarrhea or loose stools.         . meclizine (ANTIVERT) 25 MG tablet   Oral   Take 1 tablet (25 mg total) by mouth every 8 (eight) hours as needed for dizziness.   30 tablet   0   . metFORMIN (GLUCOPHAGE) 500 MG tablet   Oral   Take 1 tablet (500 mg total) by mouth 2 (two) times daily with a meal.   180 tablet   1     New instructions   . Meth-Hyo-M Bl-Na Phos-Ph Sal (URIBEL) 118 MG CAPS      ONE TABLET FOUR TIMES DAILY AS NEEDED WITH 8 OZ OF WATER   25 capsule   3     PLEASE REFILL   . neomycin-polymyxin-hydrocortisone (CORTISPORIN) 3.5-10000-1 otic suspension      Reported on 01/05/2016         . nystatin cream (MYCOSTATIN)   Topical   Apply 1 application topically 2 (two) times daily.   30 g   0    . nystatin cream (MYCOSTATIN)   Topical   Apply 1 application topically 2 (two) times daily.   30 g   0   . omeprazole (PRILOSEC) 40 MG capsule   Oral   Take 40 mg by mouth daily.          . ondansetron (ZOFRAN) 4 MG tablet   Oral   Take 4 mg by mouth every 8 (eight) hours as needed for nausea or vomiting. Reported on 12/22/2015         . polyethylene glycol (MIRALAX / GLYCOLAX) packet   Oral   Take 17 g by mouth daily as needed. Reported on 11/15/2015         . Probiotic Product (PROBIOTIC COLON SUPPORT) CAPS   Oral   Take 1 capsule by mouth daily.          . rosuvastatin (CRESTOR) 5 MG tablet   Oral   Take 1 tablet (5 mg total) by mouth daily at 6 PM.   90 tablet   0     New instructions   . sertraline (ZOLOFT) 50 MG tablet   Oral   Take 1 tablet (50 mg total) by mouth daily.   90 tablet   2   . Simethicone (GAS-X EXTRA STRENGTH) 125 MG CAPS   Oral   Take 1 capsule by mouth as needed. Reported on 11/24/2015         . solifenacin (VESICARE) 5 MG tablet   Oral   Take 5 mg by mouth daily.         . traMADol (ULTRAM) 50 MG tablet   Oral   Take 1 tablet (50 mg total) by mouth every 12 (twelve) hours as needed for  moderate pain.   12 tablet   0   . Vitamins A & D 5000-400 units CAPS   Oral   Take 1 capsule by mouth daily. Reported on 12/29/2015           Allergies Biaxin; Copaxone; Decadron; Dexamethasone sodium phosphate; Diltiazem hcl; Diltiazem hcl; Flagyl; Gabapentin; Iohexol; Ivp dye; Levothyroxine; Sulfa antibiotics; Sulfonamide derivatives; Synthroid; Copaxone ; and Interferon beta-1a  Family History  Problem Relation Age of Onset  . Arthritis Sister   . Aneurysm Brother   . Heart disease Daughter   . ALS Mother   . Colon cancer    . Kidney disease Sister     Social History Social History  Substance Use Topics  . Smoking status: Never Smoker   . Smokeless tobacco: Never Used  . Alcohol Use: No    Review of  Systems Constitutional: No fever/chills Eyes: No visual changes. ENT: No sore throat. Cardiovascular: Denies chest pain. Respiratory: Denies shortness of breath. Gastrointestinal: No abdominal pain.  No nausea, no vomiting.  No diarrhea.  No constipation. Genitourinary: Negative for dysuria. Musculoskeletal: Negative for back pain. Skin: Negative for rash. Neurological: Negative for headaches, focal weakness or numbness. Psychiatric: Anxiety depression Endocrine:Hypertension, hyperlipidemia, and diabetes.  Hematological/Lymphatic: Allergic/Immunilogical: See medication list   ____________________________________________   PHYSICAL EXAM:  VITAL SIGNS: ED Triage Vitals  Enc Vitals Group     BP 02/09/16 1006 149/89 mmHg     Pulse Rate 02/09/16 1006 77     Resp 02/09/16 1006 20     Temp 02/09/16 1006 99.3 F (37.4 C)     Temp Source 02/09/16 1006 Oral     SpO2 02/09/16 1006 96 %     Weight 02/09/16 1002 159 lb (72.122 kg)     Height 02/09/16 1002 5\' 2"  (1.575 m)     Head Cir --      Peak Flow --      Pain Score 02/09/16 1000 10     Pain Loc --      Pain Edu? --      Excl. in [place]? --     Constitutional: Alert and oriented. Well appearing and in no acute distress. Eyes: Conjunctivae are normal. PERRL. EOMI. Head: Atraumatic. Nose: No congestion/rhinnorhea. Mouth/Throat: Mucous membranes are moist.  Oropharynx non-erythematous. Neck: No stridor.  No cervical spine tenderness to palpation. Hematological/Lymphatic/Immunilogical: No cervical lymphadenopathy. Cardiovascular: Normal rate, regular rhythm. Grossly normal heart sounds.  Good peripheral circulation. Respiratory: Normal respiratory effort.  No retractions. Lungs CTAB. Gastrointestinal: Soft and nontender. No distention. No abdominal bruits. No CVA tenderness. Musculoskeletal: No lower extremity tenderness nor edema.  No joint effusions. Neurologic:  Normal speech and language. No gross focal neurologic deficits  are appreciated. No gait instability. Skin:  Skin is warm, dry and intact. No rash noted. Psychiatric: Mood and affect are normal. Speech and behavior are normal.  ____________________________________________   LABS (all labs ordered are listed, but only abnormal results are displayed)  Labs Reviewed - No data to display ____________________________________________  EKG   ____________________________________________  RADIOLOGY  No acute findings on x-ray. Possible loose bodies found posterior aspect of the right knee. ____________________________________________   PROCEDURES  Procedure(s) performed: None  Procedures  Critical Care performed: No  ____________________________________________   INITIAL IMPRESSION / ASSESSMENT AND PLAN / ED COURSE  Pertinent labs & imaging results that were available during my care of the patient were reviewed by me and considered in my medical decision making (see chart for details).  Pain  left knee. Discussed x-ray finding with patient. Patient placed in a knee immobilizer but advised elastic knee support can be purchased over-the-counter. Patient advised to follow orthopedics for definitive evaluation and treatment. ____________________________________________   FINAL CLINICAL IMPRESSION(S) / ED DIAGNOSES  Final diagnoses:  Sprain of right knee, initial encounter      NEW MEDICATIONS STARTED DURING THIS VISIT:  New Prescriptions   TRAMADOL (ULTRAM) 50 MG TABLET    Take 1 tablet (50 mg total) by mouth every 12 (twelve) hours as needed for moderate pain.     Note:  This document was prepared using Dragon voice recognition software and may include unintentional dictation errors.    Sable Feil, PA-C 02/09/16 [place], MD 02/09/16 [phone]

## 2016-02-09 NOTE — ED Notes (Signed)
See triage note states she may have twisted her right leg/knee on Sunday  conts to have pain to right knee which is now radiating into right hip area  No deformity noted

## 2016-02-10 ENCOUNTER — Ambulatory Visit: Payer: Self-pay | Admitting: *Deleted

## 2016-02-13 ENCOUNTER — Ambulatory Visit: Payer: Commercial Managed Care - HMO

## 2016-02-13 ENCOUNTER — Other Ambulatory Visit: Payer: Self-pay

## 2016-02-13 ENCOUNTER — Ambulatory Visit (INDEPENDENT_AMBULATORY_CARE_PROVIDER_SITE_OTHER): Payer: Commercial Managed Care - HMO | Admitting: Family Medicine

## 2016-02-13 ENCOUNTER — Encounter: Payer: Self-pay | Admitting: Family Medicine

## 2016-02-13 VITALS — BP 132/80 | HR 81 | Temp 98.8°F | Resp 14 | Wt 159.0 lb

## 2016-02-13 DIAGNOSIS — S8391XD Sprain of unspecified site of right knee, subsequent encounter: Secondary | ICD-10-CM | POA: Diagnosis not present

## 2016-02-13 DIAGNOSIS — M25461 Effusion, right knee: Secondary | ICD-10-CM

## 2016-02-13 DIAGNOSIS — S8391XA Sprain of unspecified site of right knee, initial encounter: Secondary | ICD-10-CM | POA: Insufficient documentation

## 2016-02-13 DIAGNOSIS — M25561 Pain in right knee: Secondary | ICD-10-CM

## 2016-02-13 DIAGNOSIS — F3341 Major depressive disorder, recurrent, in partial remission: Secondary | ICD-10-CM

## 2016-02-13 MED ORDER — SERTRALINE HCL 50 MG PO TABS: 50.0000 mg | ORAL_TABLET | Freq: Every day | ORAL | Status: DC

## 2016-02-13 MED ORDER — HUGO ROLLING WALKER ELITE MISC: Status: DC

## 2016-02-13 NOTE — Progress Notes (Signed)
BP 132/80 mmHg  Pulse 81  Temp(Src) 98.8 F (37.1 C) (Oral)  Resp 14  Wt 159 lb (72.122 kg)  SpO2 95%   Subjective:    Patient ID: [name], female    DOB: 03-31-1930, 82 y.o.   MRN: [id number]  HPI: [name] is a 82 y.o. female  Chief Complaint  Patient presents with  . Hospitalization Follow-up   She twisted her right knee about a week ago She was walking on hard floor, at home and she turned and her knee stayed put; it popped and really hurt It hurt immediately; she couldn't put pressure on the right leg; she dragged the leg and then went to urgent care a week ago, July 3rd They gave her rx for tramadol which she has not filled It's getting a little better; still hurts a little bit She is wearing a brace on the right She has an appt with PT later today at 1:00 pm She will see Dr. Marry Guan, but has not made the appointment yet Using tylenol for pain Pain right now is 7 out of 10  Relevant past medical, surgical, family and social history reviewed Past Medical History  Diagnosis Date  . GERD (gastroesophageal reflux disease)   . Vertigo   . Essential hypertension, benign   . Paroxysmal supraventricular tachycardia ([place])   . Pure hypercholesterolemia   . MS (multiple sclerosis) ([employer])   . Fibromyalgia   . Diverticulosis   . Diabetes mellitus without complication ([place])   . Osteoarthritis   . Dyslipidemia   . SOB (shortness of breath)     SECONDARY TO REFLUX  . Chest pain     SECONDARY TO GERD  . Peripheral neuropathy (HCC)     MILD  . Cancer ([place])     breast  . Depression   . Chronic interstitial cystitis   . History of fractured rib   . Chronic right hip pain   . History of TIA (transient ischemic attack)   . Carpal tunnel syndrome   . Thyroid nodule   . Hearing loss of left ear   . Vitamin B 12 deficiency   . Protein malnutrition ([place])   . Frequent falls   . Anxiety and depression   . Irritable bowel   . Recurrent UTI   . Solitary  pulmonary nodule on lung CT 12/04/2015    3 mm LLL lung nodule June 2016, March 2017  . Tachycardia, paroxysmal (HCC)     Reportedly Paroxysmal Supraventricular Tachycardia, but unable to find documentation confirming this   Social History  Substance Use Topics  . Smoking status: Never Smoker   . Smokeless tobacco: Never Used  . Alcohol Use: No   Interim medical history since last visit reviewed. Allergies and medications reviewed  Review of Systems Per HPI unless specifically indicated above     Objective:    BP 132/80 mmHg  Pulse 81  Temp(Src) 98.8 F (37.1 C) (Oral)  Resp 14  Wt 159 lb (72.122 kg)  SpO2 95%  Wt Readings from Last 3 Encounters:  02/13/16 159 lb (72.122 kg)  02/09/16 159 lb (72.122 kg)  02/01/16 159 lb 11.2 oz (72.439 kg)    Physical Exam  Constitutional: She appears well-developed and well-nourished.  Cardiovascular: Normal rate.   Pulmonary/Chest: Effort normal.  Musculoskeletal:       Right knee: She exhibits decreased range of motion and swelling. She exhibits no effusion, no ecchymosis, no deformity, no laceration and no erythema.  Tenderness found.  Soft mass just distal to infrapatellar tendon right >> left  Skin: No abrasion and no bruising noted. No erythema.  Psychiatric: Her mood appears not anxious. She does not exhibit a depressed mood.      Assessment & Plan:   Problem List Items Addressed This Visit      Musculoskeletal and Integument   Right knee sprain    Agree with PT and ortho referral; brace, pain relief discussed; fall prevention; Rx for rolling walker given        Other   Pain and swelling of right knee - Primary    Agree with physical therapy and referral to orthopaedist; continue to wear brace, but do exercises with foot, ankle, calf to decrease risk of blood clot; topical ice application periodically for pain and swelling         Follow up plan: Return if symptoms worsen or fail to improve.  An after-visit summary  was printed and given to the patient at [place].  Please see the patient instructions which may contain other information and recommendations beyond what is mentioned above in the assessment and plan.  Meds ordered this encounter  Medications  . ciprofloxacin (CIPRO) 500 MG tablet    Sig: TAKE 1 TABLET BY MOUTH TWICE DAILY FOR 10 DAYS.    Refill:  0  . Misc. Devices ([place]) MISC    Sig: Rolling walker; knee sprain, arthritis; LON 99 months    Dispense:  1 each    Refill:  0

## 2016-02-13 NOTE — Telephone Encounter (Signed)
approved

## 2016-02-13 NOTE — Patient Instructions (Signed)
Please do see Dr. Marry Guan about your knee pain and the swelling in the front of the right leg below the knee See the physical therapist Okay to use ice topically for 15 minutes at a time, and always protect the skin for the ice by using a thin cloth between the cold and your skin

## 2016-02-15 ENCOUNTER — Telehealth: Payer: Self-pay | Admitting: Family Medicine

## 2016-02-15 DIAGNOSIS — M25561 Pain in right knee: Secondary | ICD-10-CM

## 2016-02-15 NOTE — Telephone Encounter (Signed)
Forwarded to provider.

## 2016-02-15 NOTE — Telephone Encounter (Signed)
Pt needs referral to DR HOOTEN ([place]) IN [place]. CALL WHEN APPT IS DONE PER PATIENT

## 2016-02-15 NOTE — Telephone Encounter (Signed)
Referral entered; please call her as requested; thank you

## 2016-02-18 NOTE — Assessment & Plan Note (Addendum)
Agree with PT and ortho referral; brace, pain relief discussed; fall prevention; Rx for rolling walker given

## 2016-02-18 NOTE — Assessment & Plan Note (Signed)
Agree with physical therapy and referral to orthopaedist; continue to wear brace, but do exercises with foot, ankle, calf to decrease risk of blood clot; topical ice application periodically for pain and swelling

## 2016-02-21 DIAGNOSIS — J330 Polyp of nasal cavity: Secondary | ICD-10-CM | POA: Diagnosis not present

## 2016-02-21 DIAGNOSIS — H66001 Acute suppurative otitis media without spontaneous rupture of ear drum, right ear: Secondary | ICD-10-CM | POA: Diagnosis not present

## 2016-02-24 ENCOUNTER — Other Ambulatory Visit: Payer: Self-pay

## 2016-02-24 MED ORDER — MECLIZINE HCL 25 MG PO TABS: 25.0000 mg | ORAL_TABLET | Freq: Three times a day (TID) | ORAL | Status: DC | PRN

## 2016-02-24 NOTE — Telephone Encounter (Signed)
Pharmacy called from [place] # [phone] wants to get refill.  Pt is out of town

## 2016-02-24 NOTE — Telephone Encounter (Signed)
Called into pharmacy

## 2016-02-28 ENCOUNTER — Ambulatory Visit: Payer: Commercial Managed Care - HMO | Admitting: Family Medicine

## 2016-02-29 ENCOUNTER — Other Ambulatory Visit: Payer: Self-pay | Admitting: *Deleted

## 2016-02-29 ENCOUNTER — Encounter: Payer: Self-pay | Admitting: *Deleted

## 2016-02-29 NOTE — Patient Outreach (Signed)
[facility]) Care Management  02/29/2016   [name] 07-14-1930 [id number]  Subjective: RN Health Coach telephone call to patient.  [employer] compliance verified. Per patient she is in [place] visiting her son who is getting married. Patient blood sugar is 136 fasting today. Patient has not had any hypo or hyperglycemic reactions within the last outreach call. Per patient she forgot her brace for her knee and left it in [place]. Patient talked about taking aspirin each day and for her headache. RN explained to patient that the aspirin for her headache and the one she is to take daily are different doses. Patient thinks she has been taking the 325 asa.  Patient is going to the store to get the 81 mg asa for daily dose. Patient stated she has a lot of swelling and fluid in her legs. Patient has an appointment with  orthopedic physician regarding the knee and will have him to look at the swelling in the leg. Patient is not on a diuretic. Patient has agreed to follow up outreach call.     Objective:   Current Medications:  Current Outpatient Prescriptions  Medication Sig Dispense Refill  . acetaminophen (TYLENOL) 500 MG tablet Take by mouth.    Marland Kitchen amoxicillin-clavulanate (AUGMENTIN) 875-125 MG tablet Take 1 tablet by mouth every 12 (twelve) hours. (Patient not taking: Reported on 02/02/2016) 14 tablet 0  . aspirin EC 81 MG tablet Take 1 tablet (81 mg total) by mouth daily. 30 tablet 11  . b complex vitamins tablet Take 1 tablet by mouth daily.    . Carboxymethylcell-Hypromellose (GENTEAL OP) Apply 1 drop to eye. Each eye as needed    . Cholecalciferol (VITAMIN D-1000 MAX ST) 1000 UNITS tablet Take 1,000 Units by mouth daily. Reported on 02/01/2016    . Cholecalciferol (VITAMIN D3) 50000 units CAPS Take 5,000 Int'l Units/day by mouth daily. Reported on 01/25/2016    . ciprofloxacin (CIPRO) 500 MG tablet TAKE 1 TABLET BY MOUTH TWICE DAILY FOR 10 DAYS.  0  . ciprofloxacin-fluocinolone  PF (OTOVEL) 0.3-0.025 % SOLN Place 0.25 mLs in ear(s) 2 (two) times daily.    . cyanocobalamin (CVS VITAMIN B12) 2000 MCG tablet Take 2,000 mcg by mouth daily.     Marland Kitchen dicyclomine (BENTYL) 10 MG capsule Take by mouth.    . ferrous sulfate 325 (65 FE) MG tablet Take 1 tablet (325 mg total) by mouth daily with breakfast. (Patient taking differently: Take 325 mg by mouth as needed. ) 90 tablet 1  . fluticasone (FLONASE) 50 MCG/ACT nasal spray SPRAY 2 SPRAYS EACH INTO BOTH NOSTRILS ONCE DAILY EACH NIGHT.  5  . hydrocortisone (ANUSOL-HC) 25 MG suppository Place 1 suppository (25 mg total) rectally 2 (two) times daily. 12 suppository 0  . loperamide (IMODIUM A-D) 2 MG tablet Take 2 mg by mouth 4 (four) times daily as needed for diarrhea or loose stools.    . meclizine (ANTIVERT) 25 MG tablet Take 1 tablet (25 mg total) by mouth every 8 (eight) hours as needed for dizziness. 30 tablet 0  . metFORMIN (GLUCOPHAGE) 500 MG tablet Take 1 tablet (500 mg total) by mouth 2 (two) times daily with a meal. 180 tablet 1  . Meth-Hyo-M Bl-Na Phos-Ph Sal (URIBEL) 118 MG CAPS ONE TABLET FOUR TIMES DAILY AS NEEDED WITH 8 OZ OF WATER 25 capsule 3  . Misc. Devices (HUGO ROLLING WALKER ELITE) MISC Rolling walker; knee sprain, arthritis; LON 99 months 1 each 0  . neomycin-polymyxin-hydrocortisone (CORTISPORIN)  3.5-10000-1 otic suspension Reported on 01/05/2016    . nystatin cream (MYCOSTATIN) Apply 1 application topically 2 (two) times daily. 30 g 0  . omeprazole (PRILOSEC) 40 MG capsule Take 40 mg by mouth daily.     . ondansetron (ZOFRAN) 4 MG tablet Take 4 mg by mouth every 8 (eight) hours as needed for nausea or vomiting. Reported on 12/22/2015    . polyethylene glycol (MIRALAX / GLYCOLAX) packet Take 17 g by mouth daily as needed. Reported on 11/15/2015    . Probiotic Product (PROBIOTIC COLON SUPPORT) CAPS Take 1 capsule by mouth daily.     . rosuvastatin (CRESTOR) 5 MG tablet Take 1 tablet (5 mg total) by mouth daily at 6 PM.  90 tablet 0  . sertraline (ZOLOFT) 50 MG tablet Take 1 tablet (50 mg total) by mouth daily. 90 tablet 3  . Simethicone (GAS-X EXTRA STRENGTH) 125 MG CAPS Take 1 capsule by mouth as needed. Reported on 11/24/2015    . solifenacin (VESICARE) 5 MG tablet Take 5 mg by mouth daily.    . Vitamins A & D 5000-400 units CAPS Take 1 capsule by mouth daily. Reported on 12/29/2015     No current facility-administered medications for this visit.     Functional Status:  In your present state of health, do you have any difficulty performing the following activities: 02/29/2016 02/02/2016  Hearing? [name]  Vision? Y Y  Difficulty concentrating or making decisions? N N  Walking or climbing stairs? Y Y  Dressing or bathing? N N  Doing errands, shopping? [name]  Preparing Food and eating ? Y Y  Using the Toilet? N N  In the past six months, have you accidently leaked urine? Y Y  Do you have problems with loss of bowel control? N N  Managing your Medications? N N  Managing your Finances? N N  Housekeeping or managing your Housekeeping? [name]  Some recent data might be hidden    Fall/Depression Screening: PHQ 2/9 Scores 02/29/2016 02/02/2016 12/06/2015 11/25/2015 11/10/2015 10/14/2015 10/12/2015  PHQ - 2 Score 1 1 0 _0 PHQ- 9 Score - - - - - - -   THN CM Care Plan Problem One   Flowsheet Row Most Recent Value  Care Plan Problem One  knowledge deficit in self management of diabetes  Role Documenting the Problem One  [facility] [place] for Problem One  Active  THN Long Term Goal (31-90 days)  Patient will continue not have any admissions for diabetes in the next 90 days  THN Long Term Goal Start Date  02/29/16  Interventions for Problem One Long Term Goal  RN reminded patient to keep all apoointments with PCP and other caregivers. RN reminded patient the importance of taking all medication as prescribed.  THN CM Short Term Goal #1 (0-30 days)  Patient will verbalized the symptoms of hypo and hyperglycemia  within the next 30 days.   THN CM Short Term Goal #1 Start Date  02/29/16  [facility] CM Short Term Goal #1 Met Date  02/29/16  Interventions for Short Term Goal #1  RN discussed hypo and hyperglycemia symptoms with patient. RN will send picture chart of hypo and hyperglycemia symptoms and action plan. RN will follow up with discussion and teach back with a month.  THN CM Short Term Goal #2 Start Date  02/29/16  [facility] CM Short Term Goal #2 Met Date  02/29/16  Interventions for Short Term Goal #  2  RN reminded the patient of checking her blood sugar each morning. RN explained to patient to write it down with a color marker since patient having some difficult seeing it.  THN CM Short Term Goal #3 (0-30 days)  Patient will not have any falls within the next 30 days  THN CM Short Term Goal #3 Start Date  02/29/16  Interventions for Short Tern Goal #3  RN discussed with patient about fall safety. Patient has follow up appointment with orthopedic      Assessment:  Patient is [place] of [place] at this time Patient has injured knee Patient will continue to benefit from [place] telephonic outreach for education and support for diabetes self management.  Plan:  RN discussed the difference between daily aspirin dose and the dosage she takes for headaches RN will send patient educational material about Elderly and Heat exhaustion RN will follow up with patient within the month of August  Rechel Delosreyes [facility] [phone]

## 2016-03-13 DIAGNOSIS — M25561 Pain in right knee: Secondary | ICD-10-CM | POA: Diagnosis not present

## 2016-03-13 DIAGNOSIS — M2391 Unspecified internal derangement of right knee: Secondary | ICD-10-CM | POA: Diagnosis not present

## 2016-03-16 ENCOUNTER — Encounter: Payer: Self-pay | Admitting: Urology

## 2016-03-16 ENCOUNTER — Ambulatory Visit (INDEPENDENT_AMBULATORY_CARE_PROVIDER_SITE_OTHER): Payer: Commercial Managed Care - HMO | Admitting: Urology

## 2016-03-16 VITALS — BP 174/79 | HR 65 | Ht 60.0 in | Wt 160.4 lb

## 2016-03-16 DIAGNOSIS — N3020 Other chronic cystitis without hematuria: Secondary | ICD-10-CM

## 2016-03-16 DIAGNOSIS — N390 Urinary tract infection, site not specified: Secondary | ICD-10-CM

## 2016-03-16 DIAGNOSIS — R3129 Other microscopic hematuria: Secondary | ICD-10-CM | POA: Diagnosis not present

## 2016-03-16 LAB — URINALYSIS, COMPLETE
BILIRUBIN UA: NEGATIVE
Bilirubin, UA: NEGATIVE
GLUCOSE, UA: NEGATIVE
Glucose, UA: NEGATIVE
Ketones, UA: NEGATIVE
NITRITE UA: NEGATIVE
NITRITE UA: NEGATIVE
PH UA: 5 (ref 5.0–7.5)
PH UA: 5.5 (ref 5.0–7.5)
PROTEIN UA: NEGATIVE
Specific Gravity, UA: >1.03 — ABNORMAL HIGH (ref 1.005–1.030)
Urobilinogen, Ur: 0.2 mg/dL (ref 0.2–1.0)
Urobilinogen, Ur: 0.2 mg/dL (ref 0.2–1.0)

## 2016-03-16 LAB — MICROSCOPIC EXAMINATION
BACTERIA UA: NONE SEEN
RBC MICROSCOPIC, UA: NONE SEEN /HPF (ref 0–?)

## 2016-03-16 MED ORDER — SODIUM BICARBONATE 8.4 % IV SOLN
11.0000 mL | Freq: Once | INTRAVENOUS | Status: AC
  Administered 2016-03-16: 11 mL

## 2016-03-16 NOTE — Progress Notes (Signed)
Bladder Rescue Solution Instillation  Due to IC patient is present today for a Rescue Solution Treatment.  Patient was cleaned and prepped in a sterile fashion with betadine and lidocaine 2% jelly was instilled into the urethra.  A 14 FR catheter was inserted, urine return was noted 39ml, urine was yellow in color.  Instilled a solution consisting of 44ml of Sodium Bicarb, 2 ml Lidocaine and 1 ml of Heparin. The catheter was then removed. Patient tolerated well, no complications were noted.   Performed by: Fonnie Jarvis, CMA  Follow up/ Additional Notes: as needed

## 2016-03-17 NOTE — Progress Notes (Signed)
10:16 PM   [name] [birth year] [id number]  Referring provider: Arnetha Courser, MD [address] [place], [place] [address]  Chief Complaint  Patient presents with  . Cystitis    Rescue Solution    HPI: Patient is an 82 year old Caucasian female with a history of IC who presents today for rescue installation.     She has recently strained her right knee and is currently under evaluation with orthopedics.  This is the reason for missing her last few rescue solutions.  History of hematuria Patient had a CT abdomen and pelvis wo contrast on 08/03/2015 which noted a stable hyperdense cyst in the left kidney.  A cystoscopy on 01/04/2016 which was negative.  UA today is negative for hematuria.    History of recurrent UTI's Patient has had greater than 4 documented UTI's over the last year.  She has had mutliple organisms with varibble resistent pattern.   UA is clear today.    Today, she states she is experiencing suprapubic discomfort, dysuria and nocturia.  She states that the symptoms began several weeks ago.  The pain is 8/10.  It lasts for several minutes.  Nothing makes the pain worse.  Emptying her bladder eases the pain somewhat.  She has not had gross hematuria.  She denies fever, chills, nausea or vomiting.  Her UA today is unremarkable.      PMH: Past Medical History:  Diagnosis Date  . Anxiety and depression   . Cancer (HCC)    breast  . Carpal tunnel syndrome   . Chest pain    SECONDARY TO GERD  . Chronic interstitial cystitis   . Chronic right hip pain   . Depression   . Diabetes mellitus without complication ([place])   . Diverticulosis   . Dyslipidemia   . Essential hypertension, benign   . Fibromyalgia   . Frequent falls   . GERD (gastroesophageal reflux disease)   . Hearing loss of left ear   . History of fractured rib   . History of TIA (transient ischemic attack)   . Irritable bowel   . MS (multiple sclerosis) ([place])   .  Osteoarthritis   . Paroxysmal supraventricular tachycardia ([place])   . Peripheral neuropathy (HCC)    MILD  . Protein malnutrition ([place])   . Pure hypercholesterolemia   . Recurrent UTI   . SOB (shortness of breath)    SECONDARY TO REFLUX  . Solitary pulmonary nodule on lung CT 12/04/2015   3 mm LLL lung nodule June 2016, March 2017  . Tachycardia, paroxysmal (HCC)    Reportedly Paroxysmal Supraventricular Tachycardia, but unable to find documentation confirming this  . Thyroid nodule   . Vertigo   . Vitamin B 12 deficiency     Surgical History: Past Surgical History:  Procedure Laterality Date  . APPENDECTOMY    . BLADDER TACKING     X 3  . BREAST LUMPECTOMY    . BREAST SURGERY    . CARDIAC CATHETERIZATION  10/2001   Normal coronary arteries with the exception of 20% proximal D1  . CHOLECYSTECTOMY    . COLONOSCOPY WITH PROPOFOL N/A 01/21/2015   Procedure: COLONOSCOPY WITH PROPOFOL;  Surgeon: Josefine Class, MD;  [facility] ENDOSCOPY;  Service: Endoscopy;  Laterality: N/A;  . ESOPHAGOGASTRODUODENOSCOPY N/A 01/21/2015   Procedure: ESOPHAGOGASTRODUODENOSCOPY (EGD);  Surgeon: Josefine Class, MD;  Location: [facility] ENDOSCOPY;  Service: Endoscopy;  Laterality: N/A;  . EYE SURGERIES  WITH BUCKLE DETACHMENT OF THE RETINA  . HEMORRHOID SURGERY     WITH RECONSTRUCTION  . MASTECTOMY Right    DX MASTITIS/NO CANCER.Marland KitchenSALINE IMPLANT  . MASTECTOMY    . TONSILLECTOMY    . TOTAL ABDOMINAL HYSTERECTOMY W/ BILATERAL SALPINGOOPHORECTOMY    . TYMPANOPLASTY      Home Medications:    Medication List       Accurate as of 03/16/16 11:59 PM. Always use your most recent med list.          acetaminophen 500 MG tablet Commonly known as:  TYLENOL Take by mouth.   amoxicillin-clavulanate 875-125 MG tablet Commonly known as:  AUGMENTIN Take 1 tablet by mouth every 12 (twelve) hours.   aspirin EC 81 MG tablet Take 1 tablet (81 mg total) by mouth daily.   b complex vitamins  tablet Take 1 tablet by mouth daily.   ciprofloxacin 500 MG tablet Commonly known as:  CIPRO TAKE 1 TABLET BY MOUTH TWICE DAILY FOR 10 DAYS.   ciprofloxacin-fluocinolone PF 0.3-0.025 % Soln Commonly known as:  OTOVEL Place 0.25 mLs in ear(s) 2 (two) times daily.   CVS VITAMIN B12 2000 MCG tablet Generic drug:  cyanocobalamin Take 2,000 mcg by mouth daily.   Vitamin B-12 1000 MCG Subl   dicyclomine 10 MG capsule Commonly known as:  BENTYL Take by mouth.   erythromycin ophthalmic ointment   ferrous sulfate 325 (65 FE) MG tablet Take 1 tablet (325 mg total) by mouth daily with breakfast.   fluticasone 50 MCG/ACT nasal spray Commonly known as:  FLONASE SPRAY 2 SPRAYS EACH INTO BOTH NOSTRILS ONCE DAILY EACH NIGHT.   GAS-X EXTRA STRENGTH 125 MG Caps Generic drug:  Simethicone Take 1 capsule by mouth as needed. Reported on 11/24/2015   GENTEAL OP Apply 1 drop to eye. Each eye as needed   Brielle Rolling walker; knee sprain, arthritis; LON 99 months   hydrocortisone 25 MG suppository Commonly known as:  ANUSOL-HC Place 1 suppository (25 mg total) rectally 2 (two) times daily.   ibuprofen 800 MG tablet Commonly known as:  ADVIL,MOTRIN TAKE 1 TABLET BY MOUTH EVERY 6-8 HOURS AS NEEDED FOR PAIN   loperamide 2 MG tablet Commonly known as:  IMODIUM A-D Take 2 mg by mouth 4 (four) times daily as needed for diarrhea or loose stools.   meclizine 25 MG tablet Commonly known as:  ANTIVERT Take 1 tablet (25 mg total) by mouth every 8 (eight) hours as needed for dizziness.   meloxicam 7.5 MG tablet Commonly known as:  MOBIC Take by mouth.   metFORMIN 500 MG tablet Commonly known as:  GLUCOPHAGE Take 1 tablet (500 mg total) by mouth 2 (two) times daily with a meal.   neomycin-polymyxin-hydrocortisone 3.5-10000-1 otic suspension Commonly known as:  CORTISPORIN Reported on 01/05/2016   nystatin cream Commonly known as:  MYCOSTATIN Apply 1 application  topically 2 (two) times daily.   omeprazole 40 MG capsule Commonly known as:  PRILOSEC Take 40 mg by mouth daily.   ondansetron 4 MG tablet Commonly known as:  ZOFRAN Take 4 mg by mouth every 8 (eight) hours as needed for nausea or vomiting. Reported on 12/22/2015   polyethylene glycol packet Commonly known as:  MIRALAX / GLYCOLAX Take 17 g by mouth daily as needed. Reported on 11/15/2015   PROBIOTIC COLON SUPPORT Caps Take 1 capsule by mouth daily.   rosuvastatin 5 MG tablet Commonly known as:  CRESTOR Take 1 tablet (5 mg total)  by mouth daily at 6 PM.   sertraline 50 MG tablet Commonly known as:  ZOLOFT Take 1 tablet (50 mg total) by mouth daily.   solifenacin 5 MG tablet Commonly known as:  VESICARE Take 5 mg by mouth daily.   URIBEL 118 MG Caps ONE TABLET FOUR TIMES DAILY AS NEEDED WITH 8 OZ OF WATER   VITAMIN D-1000 MAX ST 1000 units tablet Generic drug:  Cholecalciferol Take 1,000 Units by mouth daily. Reported on 02/01/2016   Vitamin D3 50000 units Caps Take 5,000 Int'l Units/day by mouth daily. Reported on 01/25/2016   Vitamins A & D 5000-400 units Caps Take 1 capsule by mouth daily. Reported on 12/29/2015       Allergies:  Allergies  Allergen Reactions  . Biaxin [Clarithromycin] Other (See Comments) and Diarrhea  . Copaxone [Glatiramer Acetate]   . Decadron [Dexamethasone] Nausea And Vomiting and Other (See Comments)    Other reaction(s): Muscle Pain Reaction:  Abdominal pain  . Dexamethasone Sodium Phosphate Other (See Comments)  . Diltiazem Hcl Other (See Comments)    Reaction:  Unknown   . Diltiazem Hcl Other (See Comments)  . Flagyl [Metronidazole] Nausea Only and Diarrhea    Other reaction(s): Vomiting  . Gabapentin Other (See Comments)    "burning all over" Reaction:  Unknown   . Iohexol Swelling and Other (See Comments)     Desc: tongue swelling with "IVP dye" sccording to nurses notes with a lumbar myelo-12/09- asm, Onset Date: [id number]  Pts  tongue swells.  Clementeen Hoof [Iodinated Diagnostic Agents] Swelling and Other (See Comments)    Passed out  Pts tongue swells.   . Levothyroxine Nausea Only  . Sulfa Antibiotics Other (See Comments)    Reaction:  Unknown   . Sulfonamide Derivatives Other (See Comments)    Reaction:  Unknown   . Synthroid [Levothyroxine Sodium]   . Copaxone  [Glatiramer] Rash  . Interferon Beta-1a Other (See Comments) and Rash    Reaction:  Unknown     Family History: Family History  Problem Relation Age of Onset  . Arthritis Sister   . Aneurysm Brother   . Heart disease Daughter   . ALS Mother   . Colon cancer    . Kidney disease Sister     Social History:  reports that she has never smoked. She has never used smokeless tobacco. She reports that she does not drink alcohol or use drugs.  ROS: UROLOGY Frequent Urination?: No Hard to postpone urination?: No Burning/pain with urination?: Yes Get up at night to urinate?: Yes Leakage of urine?: No Urine stream starts and stops?: No Trouble starting stream?: No Do you have to strain to urinate?: No Blood in urine?: No Urinary tract infection?: No Sexually transmitted disease?: No Injury to kidneys or bladder?: No Painful intercourse?: No Weak stream?: No Currently pregnant?: No Vaginal bleeding?: No Last menstrual period?: n  Gastrointestinal Nausea?: No Vomiting?: No Indigestion/heartburn?: Yes Diarrhea?: Yes Constipation?: Yes  Constitutional Fever: No Night sweats?: Yes Weight loss?: No Fatigue?: Yes  Skin Skin rash/lesions?: No Itching?: No  Eyes Blurred vision?: Yes Double vision?: Yes  Ears/Nose/Throat Sore throat?: No Sinus problems?: Yes  Hematologic/Lymphatic Swollen glands?: No Easy bruising?: No  Cardiovascular Leg swelling?: Yes Chest pain?: No  Respiratory Cough?: No Shortness of breath?: No  Endocrine Excessive thirst?: No  Musculoskeletal Back pain?: Yes Joint pain?:  Yes  Neurological Headaches?: Yes Dizziness?: Yes  Psychologic Depression?: Yes Anxiety?: Yes  Physical Exam: Blood pressure Marland Kitchen)  174/79, pulse 65, height 5' (1.524 m), weight 160 lb 6.4 oz (72.8 kg). Constitutional: Well nourished. Alert and oriented, No acute distress. HEENT: [place] AT, moist mucus membranes. Trachea midline, no masses. Cardiovascular: No clubbing, cyanosis, or edema. Respiratory: Normal respiratory effort, no increased work of breathing. GI: Abdomen is soft, non tender, non distended, no abdominal masses.  GU: No CVA tenderness.  No bladder fullness or masses.   Skin: No rashes, bruises or suspicious lesions. Lymph: No cervical or inguinal adenopathy. Neurologic: Grossly intact, no focal deficits, moving all 4 extremities. Psychiatric: Normal mood and affect.  Laboratory Data: Lab Results  Component Value Date   WBC 4.3 10/31/2015   HGB 13.6 10/31/2015   HCT 40.9 10/31/2015   MCV 91.0 10/31/2015   PLT 190 10/31/2015   Lab Results  Component Value Date   CREATININE 0.98 12/28/2015   Lab Results  Component Value Date   HGBA1C 7.3 (H) 12/28/2015    Urinalysis: Results for orders placed or performed in visit on 03/16/16  Microscopic Examination  Result Value Ref Range   WBC, UA 0-5 0 - 5 /hpf   RBC, UA None seen 0 - 2 /hpf   Epithelial Cells (non renal) 0-10 0 - 10 /hpf   Bacteria, UA Few None seen/Few   Yeast, UA Present (A) None seen  Microscopic Examination  Result Value Ref Range   WBC, UA 0-5 0 - 5 /hpf   RBC, UA 0-2 0 - 2 /hpf   Epithelial Cells (non renal) 0-10 0 - 10 /hpf   Casts Present (A) None seen /lpf   Cast Type Hyaline casts N/A   Bacteria, UA None seen None seen/Few   Yeast, UA Present (A) None seen  Urinalysis, Complete  Result Value Ref Range   Specific Gravity, UA >1.030 (H) 1.005 - 1.030   pH, UA 5.5 5.0 - 7.5   Color, UA Yellow Yellow   Appearance Ur Clear Clear   Leukocytes, UA Trace (A) Negative   Protein, UA  Negative Negative/Trace   Glucose, UA Negative Negative   Ketones, UA Negative Negative   RBC, UA Trace (A) Negative   Bilirubin, UA Negative Negative   Urobilinogen, Ur 0.2 0.2 - 1.0 mg/dL   Nitrite, UA Negative Negative   Microscopic Examination See below:   Urinalysis, Complete  Result Value Ref Range   Specific Gravity, UA >1.030 (H) 1.005 - 1.030   pH, UA 5.0 5.0 - 7.5   Color, UA Yellow Yellow   Appearance Ur Clear Clear   Leukocytes, UA 1+ (A) Negative   Protein, UA 1+ (A) Negative/Trace   Glucose, UA Negative Negative   Ketones, UA Trace (A) Negative   RBC, UA Trace (A) Negative   Bilirubin, UA Negative Negative   Urobilinogen, Ur 0.2 0.2 - 1.0 mg/dL   Nitrite, UA Negative Negative   Microscopic Examination See below:    Assessment & Plan:    1. Interstitial cystitis:  Patient's rescue installation is performed today.  Patient missed several rescue solutions and her symptoms returned.  She does want to continue the instillations at this time.   She will return next week for her next treatment.    2. Recurrent UTI's:   UA is normal today.     3. Microscopic hematuria:  Work up completed in 2017.  No maligancies discovered.  UA checked yearly.  4. Falling:   Patient has been evaluated by cardiology and they do not feel the falls other results of a  cardiac issue.  We will need to assist the patient with undressing and getting on exam tables at this time.   Return in about 1 week (around 03/23/2016) for rescue solution and exam.  Zara Council, [facility] [address] [place], [place] [address] [phone]

## 2016-03-23 ENCOUNTER — Encounter: Payer: Self-pay | Admitting: Urology

## 2016-03-23 ENCOUNTER — Ambulatory Visit (INDEPENDENT_AMBULATORY_CARE_PROVIDER_SITE_OTHER): Payer: Commercial Managed Care - HMO | Admitting: Urology

## 2016-03-23 VITALS — BP 175/77 | HR 74 | Ht 61.0 in | Wt 160.2 lb

## 2016-03-23 DIAGNOSIS — R3129 Other microscopic hematuria: Secondary | ICD-10-CM

## 2016-03-23 DIAGNOSIS — N3010 Interstitial cystitis (chronic) without hematuria: Secondary | ICD-10-CM

## 2016-03-23 DIAGNOSIS — N952 Postmenopausal atrophic vaginitis: Secondary | ICD-10-CM | POA: Diagnosis not present

## 2016-03-23 DIAGNOSIS — Z87440 Personal history of urinary (tract) infections: Secondary | ICD-10-CM | POA: Diagnosis not present

## 2016-03-23 DIAGNOSIS — R296 Repeated falls: Secondary | ICD-10-CM

## 2016-03-23 DIAGNOSIS — W19XXXA Unspecified fall, initial encounter: Secondary | ICD-10-CM | POA: Diagnosis not present

## 2016-03-23 LAB — URINALYSIS, COMPLETE
Bilirubin, UA: NEGATIVE
GLUCOSE, UA: NEGATIVE
Ketones, UA: NEGATIVE
NITRITE UA: NEGATIVE
Specific Gravity, UA: >1.03 — ABNORMAL HIGH (ref 1.005–1.030)
UUROB: 0.2 mg/dL (ref 0.2–1.0)
pH, UA: 5 (ref 5.0–7.5)

## 2016-03-23 LAB — MICROSCOPIC EXAMINATION

## 2016-03-23 MED ORDER — LIDOCAINE HCL 2 % EX GEL
1.0000 "application " | Freq: Once | CUTANEOUS | Status: AC
  Administered 2016-03-23: 1 via URETHRAL

## 2016-03-23 MED ORDER — SODIUM BICARBONATE 8.4 % IV SOLN
11.0000 mL | Freq: Once | INTRAVENOUS | Status: AC
  Administered 2016-03-23: 11 mL

## 2016-03-23 NOTE — Progress Notes (Signed)
12:04 PM   [name] [birth year] [id number]  Referring provider: Arnetha Courser, MD [address] [place], [place] [address]  Chief Complaint  Patient presents with  . Cystitis    rescue solution    HPI: Patient is an 82 year old Caucasian female with a history of IC, fall risk and a history of recurrent UTI's who presents today for rescue installation.      Fall risk She has recently strained her right knee and is currently under evaluation with orthopedics.    History of hematuria Patient had a CT abdomen and pelvis wo contrast on 08/03/2015 which noted a stable hyperdense cyst in the left kidney.  A cystoscopy on 01/04/2016 which was negative.  UA today is negative for hematuria.  She denies any gross hematuria.  History of recurrent UTI's Patient has had greater than 4 documented UTI's over the last year.  She has had multiple organisms with variable resistant pattern.   UA contains 6-10 WBCs, moderate bacteria and epithelial cells. I believe this is a contaminated specimen.  Today, she states she is having urgency, nocturia and vaginal burning.  This is been occurring for the last 2 days.  The vaginal burning is intense.  She states it is quite bothersome to her. The burning, last several hours. She's not had a vaginal discharge. She is not noted anything that helps the burning or makes the burning worse. She has not been using her vaginal estrogen cream. Her UA today is contaminated.    PMH: Past Medical History:  Diagnosis Date  . Anxiety and depression   . Cancer (HCC)    breast  . Carpal tunnel syndrome   . Chest pain    SECONDARY TO GERD  . Chronic interstitial cystitis   . Chronic right hip pain   . Depression   . Diabetes mellitus without complication ([place])   . Diverticulosis   . Dyslipidemia   . Essential hypertension, benign   . Fibromyalgia   . Frequent falls   . GERD (gastroesophageal reflux disease)   . Hearing loss of left ear    . History of fractured rib   . History of TIA (transient ischemic attack)   . Irritable bowel   . MS (multiple sclerosis) ([place])   . Osteoarthritis   . Paroxysmal supraventricular tachycardia ([place])   . Peripheral neuropathy (HCC)    MILD  . Protein malnutrition ([place])   . Pure hypercholesterolemia   . Recurrent UTI   . SOB (shortness of breath)    SECONDARY TO REFLUX  . Solitary pulmonary nodule on lung CT 12/04/2015   3 mm LLL lung nodule June 2016, March 2017  . Tachycardia, paroxysmal (HCC)    Reportedly Paroxysmal Supraventricular Tachycardia, but unable to find documentation confirming this  . Thyroid nodule   . Vertigo   . Vitamin B 12 deficiency     Surgical History: Past Surgical History:  Procedure Laterality Date  . APPENDECTOMY    . BLADDER TACKING     X 3  . BREAST LUMPECTOMY    . BREAST SURGERY    . CARDIAC CATHETERIZATION  10/2001   Normal coronary arteries with the exception of 20% proximal D1  . CHOLECYSTECTOMY    . COLONOSCOPY WITH PROPOFOL N/A 01/21/2015   Procedure: COLONOSCOPY WITH PROPOFOL;  Surgeon: Josefine Class, MD;  Location: [facility] ENDOSCOPY;  Service: Endoscopy;  Laterality: N/A;  . ESOPHAGOGASTRODUODENOSCOPY N/A 01/21/2015   Procedure: ESOPHAGOGASTRODUODENOSCOPY (EGD);  Surgeon: Josefine Class, MD;  Location: [facility] ENDOSCOPY;  Service: Endoscopy;  Laterality: N/A;  . EYE SURGERIES     WITH BUCKLE DETACHMENT OF THE RETINA  . HEMORRHOID SURGERY     WITH RECONSTRUCTION  . MASTECTOMY Right    DX MASTITIS/NO CANCER.Marland KitchenSALINE IMPLANT  . MASTECTOMY    . TONSILLECTOMY    . TOTAL ABDOMINAL HYSTERECTOMY W/ BILATERAL SALPINGOOPHORECTOMY    . TYMPANOPLASTY      Home Medications:    Medication List       Accurate as of 03/23/16 12:04 PM. Always use your most recent med list.          acetaminophen 500 MG tablet Commonly known as:  TYLENOL Take by mouth.   amoxicillin-clavulanate 875-125 MG tablet Commonly known as:  AUGMENTIN Take 1  tablet by mouth every 12 (twelve) hours.   aspirin EC 81 MG tablet Take 1 tablet (81 mg total) by mouth daily.   b complex vitamins tablet Take 1 tablet by mouth daily.   ciprofloxacin 500 MG tablet Commonly known as:  CIPRO TAKE 1 TABLET BY MOUTH TWICE DAILY FOR 10 DAYS.   ciprofloxacin-fluocinolone PF 0.3-0.025 % Soln Commonly known as:  OTOVEL Place 0.25 mLs in ear(s) 2 (two) times daily.   [employer] VITAMIN B12 2000 MCG tablet Generic drug:  cyanocobalamin Take 2,000 mcg by mouth daily.   Vitamin B-12 1000 MCG Subl   dicyclomine 10 MG capsule Commonly known as:  BENTYL Take by mouth.   erythromycin ophthalmic ointment   ferrous sulfate 325 (65 FE) MG tablet Take 1 tablet (325 mg total) by mouth daily with breakfast.   fluticasone 50 MCG/ACT nasal spray Commonly known as:  FLONASE SPRAY 2 SPRAYS EACH INTO BOTH NOSTRILS ONCE DAILY EACH NIGHT.   GAS-X EXTRA STRENGTH 125 MG Caps Generic drug:  Simethicone Take 1 capsule by mouth as needed. Reported on 11/24/2015   GENTEAL OP Apply 1 drop to eye. Each eye as needed   [place] Rolling walker; knee sprain, arthritis; LON 99 months   hydrocortisone 25 MG suppository Commonly known as:  ANUSOL-HC Place 1 suppository (25 mg total) rectally 2 (two) times daily.   ibuprofen 800 MG tablet Commonly known as:  ADVIL,MOTRIN TAKE 1 TABLET BY MOUTH EVERY 6-8 HOURS AS NEEDED FOR PAIN   loperamide 2 MG tablet Commonly known as:  IMODIUM A-D Take 2 mg by mouth 4 (four) times daily as needed for diarrhea or loose stools.   meclizine 25 MG tablet Commonly known as:  ANTIVERT Take 1 tablet (25 mg total) by mouth every 8 (eight) hours as needed for dizziness.   meloxicam 7.5 MG tablet Commonly known as:  MOBIC Take by mouth.   metFORMIN 500 MG tablet Commonly known as:  GLUCOPHAGE Take 1 tablet (500 mg total) by mouth 2 (two) times daily with a meal.   neomycin-polymyxin-hydrocortisone 3.5-10000-1 otic  suspension Commonly known as:  CORTISPORIN Reported on 01/05/2016   nystatin cream Commonly known as:  MYCOSTATIN Apply 1 application topically 2 (two) times daily.   omeprazole 40 MG capsule Commonly known as:  PRILOSEC Take 40 mg by mouth daily.   ondansetron 4 MG tablet Commonly known as:  ZOFRAN Take 4 mg by mouth every 8 (eight) hours as needed for nausea or vomiting. Reported on 12/22/2015   polyethylene glycol packet Commonly known as:  MIRALAX / GLYCOLAX Take 17 g by mouth daily as needed. Reported on 11/15/2015   PROBIOTIC COLON SUPPORT Caps  Take 1 capsule by mouth daily.   rosuvastatin 5 MG tablet Commonly known as:  CRESTOR Take 1 tablet (5 mg total) by mouth daily at 6 PM.   sertraline 50 MG tablet Commonly known as:  ZOLOFT Take 1 tablet (50 mg total) by mouth daily.   solifenacin 5 MG tablet Commonly known as:  VESICARE Take 5 mg by mouth daily.   URIBEL 118 MG Caps ONE TABLET FOUR TIMES DAILY AS NEEDED WITH 8 OZ OF WATER   VITAMIN D-1000 MAX ST 1000 units tablet Generic drug:  Cholecalciferol Take 1,000 Units by mouth daily. Reported on 02/01/2016   Vitamin D3 50000 units Caps Take 5,000 Int'l Units/day by mouth daily. Reported on 01/25/2016   Vitamins A & D 5000-400 units Caps Take 1 capsule by mouth daily. Reported on 12/29/2015       Allergies:  Allergies  Allergen Reactions  . Biaxin [Clarithromycin] Other (See Comments) and Diarrhea  . Copaxone [Glatiramer Acetate]   . Decadron [Dexamethasone] Nausea And Vomiting and Other (See Comments)    Other reaction(s): Muscle Pain Reaction:  Abdominal pain  . Dexamethasone Sodium Phosphate Other (See Comments)  . Diltiazem Hcl Other (See Comments)    Reaction:  Unknown   . Diltiazem Hcl Other (See Comments)  . Flagyl [Metronidazole] Nausea Only and Diarrhea    Other reaction(s): Vomiting  . Gabapentin Other (See Comments)    "burning all over" Reaction:  Unknown   . Iohexol Swelling and Other  (See Comments)     Desc: tongue swelling with "IVP dye" sccording to nurses notes with a lumbar myelo-12/09- asm, Onset Date: [id number]  Pts tongue swells.  Clementeen Hoof [Iodinated Diagnostic Agents] Swelling and Other (See Comments)    Passed out  Pts tongue swells.   . Levothyroxine Nausea Only  . Sulfa Antibiotics Other (See Comments)    Reaction:  Unknown   . Sulfonamide Derivatives Other (See Comments)    Reaction:  Unknown   . Synthroid [Levothyroxine Sodium]   . Copaxone  [Glatiramer] Rash  . Interferon Beta-1a Other (See Comments) and Rash    Reaction:  Unknown     Family History: Family History  Problem Relation Age of Onset  . Arthritis Sister   . Aneurysm Brother   . Heart disease Daughter   . ALS Mother   . Colon cancer    . Kidney disease Sister     Social History:  reports that she has never smoked. She has never used smokeless tobacco. She reports that she does not drink alcohol or use drugs.  ROS: UROLOGY Frequent Urination?: No Hard to postpone urination?: Yes Burning/pain with urination?: No Get up at night to urinate?: Yes Leakage of urine?: No Urine stream starts and stops?: No Trouble starting stream?: No Do you have to strain to urinate?: No Blood in urine?: No Urinary tract infection?: No Sexually transmitted disease?: No Injury to kidneys or bladder?: No Painful intercourse?: No Weak stream?: No Currently pregnant?: No Vaginal bleeding?: No Last menstrual period?: n  Gastrointestinal Nausea?: No Vomiting?: No Indigestion/heartburn?: Yes Diarrhea?: Yes Constipation?: Yes  Constitutional Fever: No Night sweats?: Yes Weight loss?: No Fatigue?: Yes  Skin Skin rash/lesions?: No Itching?: No  Eyes Blurred vision?: Yes Double vision?: No  Ears/Nose/Throat Sore throat?: No Sinus problems?: Yes  Hematologic/Lymphatic Swollen glands?: No Easy bruising?: No  Cardiovascular Leg swelling?: Yes Chest pain?:  No  Respiratory Cough?: No Shortness of breath?: No  Endocrine Excessive thirst?: No  Musculoskeletal Back  pain?: Yes Joint pain?: Yes  Neurological Headaches?: Yes Dizziness?: Yes  Psychologic Depression?: Yes Anxiety?: Yes  Physical Exam: Blood pressure (!) 174/79, pulse 65, height 5' (1.524 m), weight 160 lb 6.4 oz (72.8 kg). Constitutional: Well nourished. Alert and oriented, No acute distress. HEENT: [place] AT, moist mucus membranes. Trachea midline, no masses. Cardiovascular: No clubbing, cyanosis, or edema. Respiratory: Normal respiratory effort, no increased work of breathing. GI: Abdomen is soft, non tender, non distended, no abdominal masses.  GU: No CVA tenderness.  No bladder fullness or masses.   Skin: No rashes, bruises or suspicious lesions. Lymph: No cervical or inguinal adenopathy. Neurologic: Grossly intact, no focal deficits, moving all 4 extremities. Psychiatric: Normal mood and affect.  Laboratory Data: Lab Results  Component Value Date   WBC 4.3 10/31/2015   HGB 13.6 10/31/2015   HCT 40.9 10/31/2015   MCV 91.0 10/31/2015   PLT 190 10/31/2015   Lab Results  Component Value Date   CREATININE 0.98 12/28/2015   Lab Results  Component Value Date   HGBA1C 7.3 (H) 12/28/2015    Urinalysis: Results for orders placed or performed in visit on 03/16/16  Microscopic Examination  Result Value Ref Range   WBC, UA 0-5 0 - 5 /hpf   RBC, UA None seen 0 - 2 /hpf   Epithelial Cells (non renal) 0-10 0 - 10 /hpf   Bacteria, UA Few None seen/Few   Yeast, UA Present (A) None seen  Microscopic Examination  Result Value Ref Range   WBC, UA 0-5 0 - 5 /hpf   RBC, UA 0-2 0 - 2 /hpf   Epithelial Cells (non renal) 0-10 0 - 10 /hpf   Casts Present (A) None seen /lpf   Cast Type Hyaline casts N/A   Bacteria, UA None seen None seen/Few   Yeast, UA Present (A) None seen  Urinalysis, Complete  Result Value Ref Range   Specific Gravity, UA >1.030 (H) 1.005 -  1.030   pH, UA 5.5 5.0 - 7.5   Color, UA Yellow Yellow   Appearance Ur Clear Clear   Leukocytes, UA Trace (A) Negative   Protein, UA Negative Negative/Trace   Glucose, UA Negative Negative   Ketones, UA Negative Negative   RBC, UA Trace (A) Negative   Bilirubin, UA Negative Negative   Urobilinogen, Ur 0.2 0.2 - 1.0 mg/dL   Nitrite, UA Negative Negative   Microscopic Examination See below:   Urinalysis, Complete  Result Value Ref Range   Specific Gravity, UA >1.030 (H) 1.005 - 1.030   pH, UA 5.0 5.0 - 7.5   Color, UA Yellow Yellow   Appearance Ur Clear Clear   Leukocytes, UA 1+ (A) Negative   Protein, UA 1+ (A) Negative/Trace   Glucose, UA Negative Negative   Ketones, UA Trace (A) Negative   RBC, UA Trace (A) Negative   Bilirubin, UA Negative Negative   Urobilinogen, Ur 0.2 0.2 - 1.0 mg/dL   Nitrite, UA Negative Negative   Microscopic Examination See below:    Assessment & Plan:    1. Interstitial cystitis:  Patient's rescue installation is performed today.  Patient missed several rescue solutions and her symptoms returned.  She does want to continue the instillations at this time.   She will return next week for her next treatment.    2. Recurrent UTI's:   UA is contaminated today.       3. Microscopic hematuria:  Work up completed in 2017.  No malignancies discovered.  UA checked yearly.  No recent gross hematuria.    4. Falling:   Patient with a bad right knee.  5. Vaginal atrophy  - reminded the patient that when women go through menopause and her estrogen levels are severely diminished, the normal vaginal flora will change.  This is due to an increase of the vaginal canal's pH. Because of this, the vaginal canal may be colonized by bacteria from the rectum instead of the protective lactobacillus.  This accompanied by the loss of the mucus barrier with vaginal atrophy is a cause of recurrent urinary tract infections.  In some studies, it has been demonstrated that  patients can experience a reduction in recurrent urinary tract infections to one a year.   Patient was given a sample of vaginal estrogen cream (Premarin) and instructed to apply 0.61m (pea-sized amount)  just inside the vaginal introitus with a finger-tip every night for two weeks and then Monday, Wednesday and Friday nights.  I explained to the patient that vaginally administered estrogen, which causes only a slight increase in the blood estrogen levels, have fewer contraindications and adverse systemic effects that oral HT.  She will return in 1 week for symptom recheck, exam and report if she was available to require the vaginal cream by prescription.  Return in about 1 week (around 03/30/2016) for UA and rescue solution.  SZara Council [facility] [address] [place] [phone]

## 2016-03-23 NOTE — Progress Notes (Signed)
Bladder Rescue Solution Instillation  Due to interstitial cystitis patient is present today for a Rescue Solution Treatment.  Patient was cleaned and prepped in a sterile fashion with betadine and lidocaine 2% jelly was instilled into the urethra.  A 14 FR catheter was inserted, urine return was noted 8ml, urine was yellow in color.  Instilled a solution consisting of 29ml of Sodium Bicarb, 2 ml Lidocaine and 1 ml of Heparin. The catheter was then removed. Patient tolerated well, no complications were noted.   Performed by: Lyndee Hensen CMA  Follow up/ Additional Notes: One week

## 2016-03-30 ENCOUNTER — Encounter: Payer: Self-pay | Admitting: Urology

## 2016-03-30 ENCOUNTER — Ambulatory Visit (INDEPENDENT_AMBULATORY_CARE_PROVIDER_SITE_OTHER): Payer: Commercial Managed Care - HMO | Admitting: Urology

## 2016-03-30 VITALS — BP 159/81 | HR 85 | Ht 61.0 in | Wt 160.4 lb

## 2016-03-30 DIAGNOSIS — N390 Urinary tract infection, site not specified: Secondary | ICD-10-CM | POA: Diagnosis not present

## 2016-03-30 DIAGNOSIS — W19XXXA Unspecified fall, initial encounter: Secondary | ICD-10-CM

## 2016-03-30 DIAGNOSIS — N3010 Interstitial cystitis (chronic) without hematuria: Secondary | ICD-10-CM | POA: Diagnosis not present

## 2016-03-30 DIAGNOSIS — R3129 Other microscopic hematuria: Secondary | ICD-10-CM | POA: Diagnosis not present

## 2016-03-30 DIAGNOSIS — R296 Repeated falls: Secondary | ICD-10-CM | POA: Diagnosis not present

## 2016-03-30 DIAGNOSIS — N952 Postmenopausal atrophic vaginitis: Secondary | ICD-10-CM | POA: Diagnosis not present

## 2016-03-30 LAB — URINALYSIS, COMPLETE
BILIRUBIN UA: NEGATIVE
Glucose, UA: NEGATIVE
KETONES UA: NEGATIVE
Nitrite, UA: NEGATIVE
PH UA: 5.5 (ref 5.0–7.5)
UUROB: 0.2 mg/dL (ref 0.2–1.0)

## 2016-03-30 LAB — MICROSCOPIC EXAMINATION: Epithelial Cells (non renal): >10 /hpf — AB (ref 0–10)

## 2016-03-30 MED ORDER — SODIUM BICARBONATE 8.4 % IV SOLN
11.0000 mL | Freq: Once | INTRAVENOUS | Status: AC
  Administered 2016-03-30: 11 mL

## 2016-03-30 NOTE — Progress Notes (Signed)
11:12 AM   [name] [birth year] [id number]  Referring provider: Arnetha Courser, MD [address] [place],  [address]  Chief Complaint  Patient presents with  . Cystitis    HPI: Patient is an 82 year old Caucasian female with a history of IC, fall risk and a history of recurrent UTI's who presents today for rescue installation.     Fall risk She has recently strained her right knee and is currently under evaluation with orthopedics.    History of hematuria Patient had a CT abdomen and pelvis wo contrast on 08/03/2015 which noted a stable hyperdense cyst in the left kidney.  A cystoscopy on 01/04/2016 which was negative.  UA today is negative for hematuria.  She denies any gross hematuria.  History of recurrent UTI's Patient has had greater than 4 documented UTI's over the last year.  She has had multiple organisms with variable resistant pattern.   UA contains 6-10 WBCs, moderate bacteria and epithelial cells. I believe this is a contaminated specimen.  Today, she states she is having frequency, dysuria (due to vaginal estrogen cream) and leakage of urine.  She states that the vaginal burning is improving.  She is not having urinary dysuria.  She is not experiencing fevers, chills, nausea or vomiting.  Her UA today notes 6-10 WBC's and 3-10 RBC's.    She feels she is having improvement with the instillations.      PMH: Past Medical History:  Diagnosis Date  . Anxiety and depression   . Cancer (HCC)    breast  . Carpal tunnel syndrome   . Chest pain    SECONDARY TO GERD  . Chronic interstitial cystitis   . Chronic right hip pain   . Depression   . Diabetes mellitus without complication ([place])   . Diverticulosis   . Dyslipidemia   . Essential hypertension, benign   . Fibromyalgia   . Frequent falls   . GERD (gastroesophageal reflux disease)   . Hearing loss of left ear   . History of fractured rib   . History of TIA (transient ischemic  attack)   . Irritable bowel   . MS (multiple sclerosis) ([place])   . Osteoarthritis   . Paroxysmal supraventricular tachycardia ([place])   . Peripheral neuropathy (HCC)    MILD  . Protein malnutrition ([place])   . Pure hypercholesterolemia   . Recurrent UTI   . SOB (shortness of breath)    SECONDARY TO REFLUX  . Solitary pulmonary nodule on lung CT 12/04/2015   3 mm LLL lung nodule June 2016, March 2017  . Tachycardia, paroxysmal (HCC)    Reportedly Paroxysmal Supraventricular Tachycardia, but unable to find documentation confirming this  . Thyroid nodule   . Vertigo   . Vitamin B 12 deficiency     Surgical History: Past Surgical History:  Procedure Laterality Date  . APPENDECTOMY    . BLADDER TACKING     X 3  . BREAST LUMPECTOMY    . BREAST SURGERY    . CARDIAC CATHETERIZATION  10/2001   Normal coronary arteries with the exception of 20% proximal D1  . CHOLECYSTECTOMY    . COLONOSCOPY WITH PROPOFOL N/A 01/21/2015   Procedure: COLONOSCOPY WITH PROPOFOL;  Surgeon: Josefine Class, MD;  Location[facility] ENDOSCOPY;  Service: Endoscopy;  Laterality: N/A;  . ESOPHAGOGASTRODUODENOSCOPY N/A 01/21/2015   Procedure: ESOPHAGOGASTRODUODENOSCOPY (EGD);  Surgeon: Josefine Class, MD;  Location: [facility] ENDOSCOPY;  Service: Endoscopy;  Laterality: N/A;  . EYE SURGERIES     WITH BUCKLE DETACHMENT OF THE RETINA  . HEMORRHOID SURGERY     WITH RECONSTRUCTION  . MASTECTOMY Right    DX MASTITIS/NO CANCER.Marland KitchenSALINE IMPLANT  . MASTECTOMY    . TONSILLECTOMY    . TOTAL ABDOMINAL HYSTERECTOMY W/ BILATERAL SALPINGOOPHORECTOMY    . TYMPANOPLASTY      Home Medications:    Medication List       Accurate as of 03/30/16 11:12 AM. Always use your most recent med list.          acetaminophen 500 MG tablet Commonly known as:  TYLENOL Take by mouth.   amoxicillin-clavulanate 875-125 MG tablet Commonly known as:  AUGMENTIN Take 1 tablet by mouth every 12 (twelve) hours.   aspirin EC 81 MG  tablet Take 1 tablet (81 mg total) by mouth daily.   b complex vitamins tablet Take 1 tablet by mouth daily.   ciprofloxacin 500 MG tablet Commonly known as:  CIPRO TAKE 1 TABLET BY MOUTH TWICE DAILY FOR 10 DAYS.   ciprofloxacin-fluocinolone PF 0.3-0.025 % Soln Commonly known as:  OTOVEL Place 0.25 mLs in ear(s) 2 (two) times daily.   dicyclomine 10 MG capsule Commonly known as:  BENTYL Take by mouth.   erythromycin ophthalmic ointment   ferrous sulfate 325 (65 FE) MG tablet Take 1 tablet (325 mg total) by mouth daily with breakfast.   fluticasone 50 MCG/ACT nasal spray Commonly known as:  FLONASE SPRAY 2 SPRAYS EACH INTO BOTH NOSTRILS ONCE DAILY EACH NIGHT.   GAS-X EXTRA STRENGTH 125 MG Caps Generic drug:  Simethicone Take 1 capsule by mouth as needed. Reported on 11/24/2015   GENTEAL OP Apply 1 drop to eye. Each eye as needed   [place] Rolling walker; knee sprain, arthritis; LON 99 months   hydrocortisone 25 MG suppository Commonly known as:  ANUSOL-HC Place 1 suppository (25 mg total) rectally 2 (two) times daily.   ibuprofen 800 MG tablet Commonly known as:  ADVIL,MOTRIN TAKE 1 TABLET BY MOUTH EVERY 6-8 HOURS AS NEEDED FOR PAIN   loperamide 2 MG tablet Commonly known as:  IMODIUM A-D Take 2 mg by mouth 4 (four) times daily as needed for diarrhea or loose stools.   meclizine 25 MG tablet Commonly known as:  ANTIVERT Take 1 tablet (25 mg total) by mouth every 8 (eight) hours as needed for dizziness.   meloxicam 7.5 MG tablet Commonly known as:  MOBIC Take by mouth.   metFORMIN 500 MG tablet Commonly known as:  GLUCOPHAGE Take 1 tablet (500 mg total) by mouth 2 (two) times daily with a meal.   neomycin-polymyxin-hydrocortisone 3.5-10000-1 otic suspension Commonly known as:  CORTISPORIN Reported on 01/05/2016   nystatin cream Commonly known as:  MYCOSTATIN Apply 1 application topically 2 (two) times daily.   omeprazole 40 MG  capsule Commonly known as:  PRILOSEC Take 40 mg by mouth daily.   ondansetron 4 MG tablet Commonly known as:  ZOFRAN Take 4 mg by mouth every 8 (eight) hours as needed for nausea or vomiting. Reported on 12/22/2015   polyethylene glycol packet Commonly known as:  MIRALAX / GLYCOLAX Take 17 g by mouth daily as needed. Reported on 11/15/2015   PROBIOTIC COLON SUPPORT Caps Take 1 capsule by mouth daily.   rosuvastatin 5 MG tablet Commonly known as:  CRESTOR Take 1 tablet (5 mg total) by mouth daily at 6 PM.   sertraline 50 MG tablet Commonly known as:  ZOLOFT Take 1 tablet (50 mg total) by mouth daily.   solifenacin 5 MG tablet Commonly known as:  VESICARE Take 5 mg by mouth daily.   URIBEL 118 MG Caps ONE TABLET FOUR TIMES DAILY AS NEEDED WITH 8 OZ OF WATER   Vitamin B-12 1000 MCG Subl   VITAMIN D-1000 MAX ST 1000 units tablet Generic drug:  Cholecalciferol Take 1,000 Units by mouth daily. Reported on 02/01/2016   Vitamin D3 50000 units Caps Take 5,000 Int'l Units/day by mouth daily. Reported on 01/25/2016   Vitamins A & D 5000-400 units Caps Take 1 capsule by mouth daily. Reported on 12/29/2015       Allergies:  Allergies  Allergen Reactions  . Biaxin [Clarithromycin] Other (See Comments) and Diarrhea  . Copaxone [Glatiramer Acetate]   . Decadron [Dexamethasone] Nausea And Vomiting and Other (See Comments)    Other reaction(s): Muscle Pain Reaction:  Abdominal pain  . Dexamethasone Sodium Phosphate Other (See Comments)  . Diltiazem Hcl Other (See Comments)    Reaction:  Unknown   . Diltiazem Hcl Other (See Comments)  . Flagyl [Metronidazole] Nausea Only and Diarrhea    Other reaction(s): Vomiting  . Gabapentin Other (See Comments)    "burning all over" Reaction:  Unknown   . Iohexol Swelling and Other (See Comments)     Desc: tongue swelling with "IVP dye" sccording to nurses notes with a lumbar myelo-12/09- asm, Onset Date: [id number]  Pts tongue swells.  Clementeen Hoof [Iodinated Diagnostic Agents] Swelling and Other (See Comments)    Passed out  Pts tongue swells.   . Levothyroxine Nausea Only  . Sulfa Antibiotics Other (See Comments)    Reaction:  Unknown   . Sulfonamide Derivatives Other (See Comments)    Reaction:  Unknown   . Synthroid [Levothyroxine Sodium]   . Copaxone  [Glatiramer] Rash  . Interferon Beta-1a Other (See Comments) and Rash    Reaction:  Unknown     Family History: Family History  Problem Relation Age of Onset  . Arthritis Sister   . Aneurysm Brother   . Heart disease Daughter   . ALS Mother   . Colon cancer    . Kidney disease Sister     Social History:  reports that she has never smoked. She has never used smokeless tobacco. She reports that she does not drink alcohol or use drugs.  ROS: UROLOGY Frequent Urination?: Yes Hard to postpone urination?: No Burning/pain with urination?: Yes Get up at night to urinate?: No Leakage of urine?: Yes Urine stream starts and stops?: No Trouble starting stream?: No Do you have to strain to urinate?: No Blood in urine?: No Urinary tract infection?: No Sexually transmitted disease?: No Injury to kidneys or bladder?: No Painful intercourse?: No Weak stream?: No Currently pregnant?: No Vaginal bleeding?: No Last menstrual period?: n  Gastrointestinal Nausea?: No Vomiting?: No Indigestion/heartburn?: No Diarrhea?: Yes Constipation?: Yes  Constitutional Fever: No Night sweats?: Yes Weight loss?: No Fatigue?: Yes  Skin Skin rash/lesions?: No Itching?: No  Eyes Blurred vision?: Yes Double vision?: Yes  Ears/Nose/Throat Sore throat?: No Sinus problems?: Yes  Hematologic/Lymphatic Swollen glands?: No Easy bruising?: No  Cardiovascular Leg swelling?: Yes Chest pain?: No  Respiratory Cough?: No Shortness of breath?: No  Endocrine Excessive thirst?: No  Musculoskeletal Back pain?: Yes Joint pain?: Yes  Neurological Headaches?:  Yes Dizziness?: Yes  Psychologic Depression?: Yes Anxiety?: Yes  Physical Exam: Blood pressure (!) 174/79, pulse 65, height 5' (1.524 m), weight 160  lb 6.4 oz (72.8 kg). Constitutional: Well nourished. Alert and oriented, No acute distress. HEENT: [place] AT, moist mucus membranes. Trachea midline, no masses. Cardiovascular: No clubbing, cyanosis, or edema. Respiratory: Normal respiratory effort, no increased work of breathing. GI: Abdomen is soft, non tender, non distended, no abdominal masses.  GU: No CVA tenderness.  No bladder fullness or masses.   Skin: No rashes, bruises or suspicious lesions. Lymph: No cervical or inguinal adenopathy. Neurologic: Grossly intact, no focal deficits, moving all 4 extremities. Psychiatric: Normal mood and affect.  Laboratory Data: Lab Results  Component Value Date   WBC 4.3 10/31/2015   HGB 13.6 10/31/2015   HCT 40.9 10/31/2015   MCV 91.0 10/31/2015   PLT 190 10/31/2015   Lab Results  Component Value Date   CREATININE 0.98 12/28/2015   Lab Results  Component Value Date   HGBA1C 7.3 (H) 12/28/2015    Urinalysis: Significant for 6-10 WBC's and 3-10 RBC's.  See EPIC.    Assessment & Plan:    1. Interstitial cystitis:  Patient's rescue installation is performed today.  Patient missed several rescue solutions due to her knee issues and recently restarted her treatments last week.  She does want to continue the instillations at this time.   She will return next week for her next treatment.    2. Recurrent UTI's:  No UTI symptoms today.       3. Microscopic hematuria:  Work up completed in 2017.  No malignancies discovered.  UA checked yearly.  No recent gross hematuria.    4. Falling:   Patient with a bad right knee.  5. Vaginal atrophy  - patient will continue the vaginal estrogen cream nightly for one more week, then switch to three nights weekly  She will return in 1 week for symptom recheck, exam and report if she was available to  require the vaginal cream by prescription.  Return in about 1 week (around 04/06/2016) for UA and rescue solution.  Zara Council, [facility] [address], [place], [place] [address] [phone]

## 2016-03-30 NOTE — Progress Notes (Signed)
Bladder Rescue Solution Instillation  Due to interstitial cystitis patient is present today for a Rescue Solution Treatment.  Patient was cleaned and prepped in a sterile fashion with betadine and lidocaine 2% jelly was instilled into the urethra.  A 14 FR catheter was inserted, urine return was noted 50ml, urine was yellow in color.  Instilled a solution consisting of 28ml of Sodium Bicarb, 2 ml Lidocaine and 1 ml of Heparin. The catheter was then removed. Patient tolerated well, no complications were noted.   Performed by: Elberta Leatherwood, CMA  Follow up/ Additional Notes: In 1 week on Friday

## 2016-04-02 ENCOUNTER — Encounter: Payer: Self-pay | Admitting: *Deleted

## 2016-04-02 ENCOUNTER — Other Ambulatory Visit: Payer: Self-pay | Admitting: *Deleted

## 2016-04-02 DIAGNOSIS — E1110 Type 2 diabetes mellitus with ketoacidosis without coma: Secondary | ICD-10-CM

## 2016-04-02 NOTE — Patient Outreach (Signed)
[facility]) Care Management  04/02/2016   KORTNEI BASDEN 08/20/1929 [id number]  Subjective: RN Health Coach telephone call to patient.  [employer] compliance verified.  Per patient she is falling a lot. She is having some swelling in foot and knee. Patient stated that she is having some neuropathy in her legs. Per patient when she falls she can't get off the floor. Her husband has to help get her up. Patient stated she doesn't have a med alert because someone is there with her all the time.    Per patient she has an appointment with an orthopedic this Friday for her knee to be injected. Patient had stated that she is on the list in [place] at an assisted living facility named [facility].  Per patient she is number 48.   Patient is receiving meals on wheels 5 x week. Her Blood sugar is 127 fasting. Per patient she not  had any  episodes of high or low blood sugars. Patient agreed to follow up outreach calls.    Objective:   Current Medications:  Current Outpatient Prescriptions  Medication Sig Dispense Refill  . acetaminophen (TYLENOL) 500 MG tablet Take by mouth.    Marland Kitchen aspirin EC 81 MG tablet Take 1 tablet (81 mg total) by mouth daily. 30 tablet 11  . b complex vitamins tablet Take 1 tablet by mouth daily.    . Carboxymethylcell-Hypromellose (GENTEAL OP) Apply 1 drop to eye. Each eye as needed    . Cholecalciferol (VITAMIN D-1000 MAX ST) 1000 UNITS tablet Take 1,000 Units by mouth daily. Reported on 02/01/2016    . Cholecalciferol (VITAMIN D3) 50000 units CAPS Take 5,000 Int'l Units/day by mouth daily. Reported on 01/25/2016    . ciprofloxacin (CIPRO) 500 MG tablet TAKE 1 TABLET BY MOUTH TWICE DAILY FOR 10 DAYS.  0  . ciprofloxacin-fluocinolone PF (OTOVEL) 0.3-0.025 % SOLN Place 0.25 mLs in ear(s) 2 (two) times daily.    . Cyanocobalamin (VITAMIN B-12) 1000 MCG SUBL     . dicyclomine (BENTYL) 10 MG capsule Take by mouth.    . erythromycin ophthalmic ointment     . ferrous  sulfate 325 (65 FE) MG tablet Take 1 tablet (325 mg total) by mouth daily with breakfast. (Patient taking differently: Take 325 mg by mouth as needed. ) 90 tablet 1  . fluticasone (FLONASE) 50 MCG/ACT nasal spray SPRAY 2 SPRAYS EACH INTO BOTH NOSTRILS ONCE DAILY EACH NIGHT.  5  . hydrocortisone (ANUSOL-HC) 25 MG suppository Place 1 suppository (25 mg total) rectally 2 (two) times daily. 12 suppository 0  . ibuprofen (ADVIL,MOTRIN) 800 MG tablet TAKE 1 TABLET BY MOUTH EVERY 6-8 HOURS AS NEEDED FOR PAIN    . loperamide (IMODIUM A-D) 2 MG tablet Take 2 mg by mouth 4 (four) times daily as needed for diarrhea or loose stools.    . meclizine (ANTIVERT) 25 MG tablet Take 1 tablet (25 mg total) by mouth every 8 (eight) hours as needed for dizziness. 30 tablet 0  . meloxicam (MOBIC) 7.5 MG tablet Take by mouth.    . metFORMIN (GLUCOPHAGE) 500 MG tablet Take 1 tablet (500 mg total) by mouth 2 (two) times daily with a meal. 180 tablet 1  . Misc. Devices (HUGO ROLLING WALKER ELITE) MISC Rolling walker; knee sprain, arthritis; LON 99 months 1 each 0  . neomycin-polymyxin-hydrocortisone (CORTISPORIN) 3.5-10000-1 otic suspension Reported on 01/05/2016    . nystatin cream (MYCOSTATIN) Apply 1 application topically 2 (two) times daily. 30 g  0  . omeprazole (PRILOSEC) 40 MG capsule Take 40 mg by mouth daily.     . ondansetron (ZOFRAN) 4 MG tablet Take 4 mg by mouth every 8 (eight) hours as needed for nausea or vomiting. Reported on 12/22/2015    . polyethylene glycol (MIRALAX / GLYCOLAX) packet Take 17 g by mouth daily as needed. Reported on 11/15/2015    . Probiotic Product (PROBIOTIC COLON SUPPORT) CAPS Take 1 capsule by mouth daily.     . rosuvastatin (CRESTOR) 5 MG tablet Take 1 tablet (5 mg total) by mouth daily at 6 PM. 90 tablet 0  . sertraline (ZOLOFT) 50 MG tablet Take 1 tablet (50 mg total) by mouth daily. 90 tablet 3  . Simethicone (GAS-X EXTRA STRENGTH) 125 MG CAPS Take 1 capsule by mouth as needed. Reported  on 11/24/2015    . solifenacin (VESICARE) 5 MG tablet Take 5 mg by mouth daily.    . Vitamins A & D 5000-400 units CAPS Take 1 capsule by mouth daily. Reported on 12/29/2015    . amoxicillin-clavulanate (AUGMENTIN) 875-125 MG tablet Take 1 tablet by mouth every 12 (twelve) hours. (Patient not taking: Reported on 03/30/2016) 14 tablet 0  . Meth-Hyo-M Bl-Na Phos-Ph Sal (URIBEL) 118 MG CAPS ONE TABLET FOUR TIMES DAILY AS NEEDED WITH 8 OZ OF WATER (Patient not taking: Reported on 03/30/2016) 25 capsule 3   No current facility-administered medications for this visit.     Functional Status:  In your present state of health, do you have any difficulty performing the following activities: 04/02/2016 02/29/2016  Hearing? [name]  Vision? Y Y  Difficulty concentrating or making decisions? N N  Walking or climbing stairs? Y Y  Dressing or bathing? N N  Doing errands, shopping? [name]  Preparing Food and eating ? Y Y  Using the Toilet? N N  In the past six months, have you accidently leaked urine? Y Y  Do you have problems with loss of bowel control? N N  Managing your Medications? N N  Managing your Finances? N N  Housekeeping or managing your Housekeeping? [name]  Some recent data might be hidden    Fall/Depression Screening: PHQ 2/9 Scores 04/02/2016 02/29/2016 02/02/2016 12/06/2015 11/25/2015 11/10/2015 10/14/2015  PHQ - 2 Score 1 1 1  0 1 1 1   PHQ- 9 Score - - - - - - -   THN CM Care Plan Problem One   Flowsheet Row Most Recent Value  Care Plan Problem One  knowledge deficit in self management of diabetes  Role Documenting the Problem One  Health [place] for Problem One  Active  THN Long Term Goal (31-90 days)  Patient will continue not have any admissions for diabetes in the next 90 days  THN Long Term Goal Start Date  04/02/16  Interventions for Problem One Long Term Goal  RN reminded patient to keep all apointments with PCP and other caregivers. RN reminded patient the importance of taking all  medication as prescribed.  THN CM Short Term Goal #3 Start Date  04/02/16  Interventions for Short Tern Goal #3  RN discussed with patient about fall safety. Patient has follow up appointment with orthopedic on Fri     Assessment:  Patient is having multiple falls Patient is not adhering to diet Patient will continue to benefit from [place] telephonic outreach for education and support for diabetes self management.  Plan:  RN referred to Chemical engineer  regarding diabetic diet RN sent educational material on how to avoid diabetic problems RN will follow up within the month of September  Kevia Zaucha [place] [facility] [phone]

## 2016-04-03 ENCOUNTER — Encounter: Payer: Self-pay | Admitting: Family Medicine

## 2016-04-03 ENCOUNTER — Ambulatory Visit (INDEPENDENT_AMBULATORY_CARE_PROVIDER_SITE_OTHER): Payer: Commercial Managed Care - HMO | Admitting: Family Medicine

## 2016-04-03 VITALS — BP 130/82 | HR 87 | Temp 98.4°F | Resp 16 | Wt 160.1 lb

## 2016-04-03 DIAGNOSIS — Z23 Encounter for immunization: Secondary | ICD-10-CM | POA: Diagnosis not present

## 2016-04-03 DIAGNOSIS — N3010 Interstitial cystitis (chronic) without hematuria: Secondary | ICD-10-CM | POA: Diagnosis not present

## 2016-04-03 DIAGNOSIS — E7800 Pure hypercholesterolemia, unspecified: Secondary | ICD-10-CM

## 2016-04-03 DIAGNOSIS — R410 Disorientation, unspecified: Secondary | ICD-10-CM | POA: Diagnosis not present

## 2016-04-03 DIAGNOSIS — F0390 Unspecified dementia without behavioral disturbance: Secondary | ICD-10-CM | POA: Diagnosis not present

## 2016-04-03 DIAGNOSIS — Z5181 Encounter for therapeutic drug level monitoring: Secondary | ICD-10-CM

## 2016-04-03 DIAGNOSIS — R2241 Localized swelling, mass and lump, right lower limb: Secondary | ICD-10-CM | POA: Diagnosis not present

## 2016-04-03 DIAGNOSIS — E1140 Type 2 diabetes mellitus with diabetic neuropathy, unspecified: Secondary | ICD-10-CM | POA: Diagnosis not present

## 2016-04-03 DIAGNOSIS — F418 Other specified anxiety disorders: Secondary | ICD-10-CM | POA: Diagnosis not present

## 2016-04-03 DIAGNOSIS — R296 Repeated falls: Secondary | ICD-10-CM | POA: Diagnosis not present

## 2016-04-03 DIAGNOSIS — I10 Essential (primary) hypertension: Secondary | ICD-10-CM | POA: Diagnosis not present

## 2016-04-03 DIAGNOSIS — S8391XD Sprain of unspecified site of right knee, subsequent encounter: Secondary | ICD-10-CM

## 2016-04-03 DIAGNOSIS — R269 Unspecified abnormalities of gait and mobility: Secondary | ICD-10-CM

## 2016-04-03 DIAGNOSIS — R2240 Localized swelling, mass and lump, unspecified lower limb: Secondary | ICD-10-CM | POA: Insufficient documentation

## 2016-04-03 LAB — COMPLETE METABOLIC PANEL WITH GFR
ALT: 22 U/L (ref 6–29)
AST: 12 U/L (ref 10–35)
Albumin: 4.1 g/dL (ref 3.6–5.1)
Alkaline Phosphatase: 40 U/L (ref 33–130)
BUN: 17 mg/dL (ref 7–25)
CHLORIDE: 107 mmol/L (ref 98–110)
CO2: 26 mmol/L (ref 20–31)
Calcium: 9.4 mg/dL (ref 8.6–10.4)
Creat: 1.1 mg/dL — ABNORMAL HIGH (ref 0.60–0.88)
GFR, EST AFRICAN AMERICAN: 53 mL/min — AB (ref >=60)
GFR, EST NON AFRICAN AMERICAN: 46 mL/min — AB (ref >=60)
Glucose, Bld: 146 mg/dL — ABNORMAL HIGH (ref 65–99)
POTASSIUM: 4.7 mmol/L (ref 3.5–5.3)
Sodium: 142 mmol/L (ref 135–146)
Total Bilirubin: 0.3 mg/dL (ref 0.2–1.2)
Total Protein: 6.7 g/dL (ref 6.1–8.1)

## 2016-04-03 LAB — CBC WITH DIFFERENTIAL/PLATELET
BASOS ABS: 0 {cells}/uL (ref 0–200)
Basophils Relative: 0 %
EOS ABS: 132 {cells}/uL (ref 15–500)
EOS PCT: 3 %
HCT: 39.7 % (ref 35.0–45.0)
HEMOGLOBIN: 12.9 g/dL (ref 11.7–15.5)
LYMPHS ABS: 1232 {cells}/uL (ref 850–3900)
Lymphocytes Relative: 28 %
MCH: 30 pg (ref 27.0–33.0)
MCHC: 32.5 g/dL (ref 32.0–36.0)
MCV: 92.3 fL (ref 80.0–100.0)
MPV: 9.7 fL (ref 7.5–12.5)
Monocytes Absolute: 440 cells/uL (ref 200–950)
Monocytes Relative: 10 %
NEUTROS ABS: 2596 {cells}/uL (ref 1500–7800)
Neutrophils Relative %: 59 %
Platelets: 186 10*3/uL (ref 140–400)
RBC: 4.3 MIL/uL (ref 3.80–5.10)
RDW: 14.7 % (ref 11.0–15.0)
WBC: 4.4 10*3/uL (ref 3.8–10.8)

## 2016-04-03 LAB — LIPID PANEL
CHOLESTEROL: 228 mg/dL — AB (ref 125–200)
HDL: 71 mg/dL (ref >=46)
LDL Cholesterol: 124 mg/dL (ref <130)
TRIGLYCERIDES: 167 mg/dL — AB (ref <150)
Total CHOL/HDL Ratio: 3.2 Ratio (ref <=5.0)
VLDL: 33 mg/dL — ABNORMAL HIGH (ref <30)

## 2016-04-03 LAB — TSH: TSH: 3.14 mIU/L

## 2016-04-03 LAB — HEMOGLOBIN A1C
HEMOGLOBIN A1C: 6.5 % — AB (ref <5.7)
MEAN PLASMA GLUCOSE: 140 mg/dL

## 2016-04-03 NOTE — Assessment & Plan Note (Signed)
Check A1c today; foot exam by MD; limit white rice, white flour, white rice, sodas, etc.

## 2016-04-03 NOTE — Assessment & Plan Note (Signed)
With memory loss; will start with labs; daughter does not think head CT needed; going on for a while; return for memory testing

## 2016-04-03 NOTE — Assessment & Plan Note (Signed)
Check lipids; current labs on every other day medicine; limit or avoid eggs, fatty meats, saturated fats

## 2016-04-03 NOTE — Assessment & Plan Note (Signed)
Will get US 

## 2016-04-03 NOTE — Assessment & Plan Note (Signed)
Seeing orthopaedist

## 2016-04-03 NOTE — Assessment & Plan Note (Signed)
Offered therapy; she declined; continue exercises at home; be safe

## 2016-04-03 NOTE — Assessment & Plan Note (Signed)
Managed by urology 

## 2016-04-03 NOTE — Assessment & Plan Note (Signed)
BP controlled today

## 2016-04-03 NOTE — Assessment & Plan Note (Signed)
Check labs today.

## 2016-04-03 NOTE — Progress Notes (Signed)
BP 130/82   Pulse 87   Temp 98.4 F (36.9 C)   Resp 16   Wt 160 lb 1.6 oz (72.6 kg)   SpO2 96%   BMI 30.25 kg/m    Subjective:    Patient ID: [name], female    DOB: August 09, 1929, 82 y.o.   MRN: [id number]  HPI: [name] is a 82 y.o. female  Chief Complaint  Patient presents with  . Follow-up   Patient is here for f/u  She is seeing orthopaedist at [facility] for her right knee pain; she is going to get a shot this week; he thinks the next thing would be surgery; she is compensating with the left leg and the left knee is starting to hurt; sharp pain; she has a lump on the side of the right leg, but ortho said that was from brace; did twist the right knee a few weeks ago   She is seeing urologist upstairs  Type 2 diabetes; her last A1c was 7.3; not watching her diet; dx'd years ago; sometimes gets dry mouth; blurred vision, with double vision; goes to Dr. Cephus Shelling at [facility]; they know about her eye conditions  High cholesterol; on statin; taking every other day, the cardiologist said she go without it; her cardiologist was going to see about taking the thing out of her chest  Depression screen [facility] 2/9 04/03/2016 04/02/2016 02/29/2016 02/02/2016 12/06/2015  Decreased Interest 0 0 0 0 0  Down, Depressed, Hopeless 0 1 1 1  0  PHQ - 2 Score 0 1 1 1  0  Altered sleeping - - - - -  Tired, decreased energy - - - - -  Change in appetite - - - - -  Feeling bad or failure about yourself  - - - - -  Trouble concentrating - - - - -  Moving slowly or fidgety/restless - - - - -  Suicidal thoughts - - - - -  PHQ-9 Score - - - - -  Difficult doing work/chores - - - - -   Relevant past medical, surgical, family and social history reviewed Past Medical History:  Diagnosis Date  . Anxiety and depression   . Cancer (HCC)    breast  . Carpal tunnel syndrome   . Chest pain    SECONDARY TO GERD  . Chronic interstitial cystitis   . Chronic right hip pain   . Depression   .  Diabetes mellitus without complication ([place])   . Diverticulosis   . Dyslipidemia   . Essential hypertension, benign   . Fibromyalgia   . Frequent falls   . GERD (gastroesophageal reflux disease)   . Hearing loss of left ear   . History of fractured rib   . History of TIA (transient ischemic attack)   . Irritable bowel   . MS (multiple sclerosis) ([place])   . Osteoarthritis   . Paroxysmal supraventricular tachycardia ([place])   . Peripheral neuropathy (HCC)    MILD  . Protein malnutrition ([place])   . Pure hypercholesterolemia   . Recurrent UTI   . SOB (shortness of breath)    SECONDARY TO REFLUX  . Solitary pulmonary nodule on lung CT 12/04/2015   3 mm LLL lung nodule June 2016, March 2017  . Tachycardia, paroxysmal (HCC)    Reportedly Paroxysmal Supraventricular Tachycardia, but unable to find documentation confirming this  . Thyroid nodule   . Vertigo   . Vitamin B 12 deficiency    Past Surgical  History:  Procedure Laterality Date  . APPENDECTOMY    . BLADDER TACKING     X 3  . BREAST LUMPECTOMY    . BREAST SURGERY    . CARDIAC CATHETERIZATION  10/2001   Normal coronary arteries with the exception of 20% proximal D1  . CHOLECYSTECTOMY    . COLONOSCOPY WITH PROPOFOL N/A 01/21/2015   Procedure: COLONOSCOPY WITH PROPOFOL;  Surgeon: Josefine Class, MD;  Location: [facility] ENDOSCOPY;  Service: Endoscopy;  Laterality: N/A;  . ESOPHAGOGASTRODUODENOSCOPY N/A 01/21/2015   Procedure: ESOPHAGOGASTRODUODENOSCOPY (EGD);  Surgeon: Josefine Class, MD;  Location: [facility] ENDOSCOPY;  Service: Endoscopy;  Laterality: N/A;  . EYE SURGERIES     WITH BUCKLE DETACHMENT OF THE RETINA  . HEMORRHOID SURGERY     WITH RECONSTRUCTION  . MASTECTOMY Right    DX MASTITIS/NO CANCER.Marland KitchenSALINE IMPLANT  . MASTECTOMY    . TONSILLECTOMY    . TOTAL ABDOMINAL HYSTERECTOMY W/ BILATERAL SALPINGOOPHORECTOMY    . TYMPANOPLASTY     Family History  Problem Relation Age of Onset  . Arthritis Sister   . Aneurysm  Brother   . Heart disease Daughter   . ALS Mother   . Colon cancer    . Kidney disease Sister    Social History  Substance Use Topics  . Smoking status: Never Smoker  . Smokeless tobacco: Never Used  . Alcohol use No   Interim medical history since last visit reviewed. Allergies and medications reviewed  Review of Systems Per HPI unless specifically indicated above     Objective:    BP 130/82   Pulse 87   Temp 98.4 F (36.9 C)   Resp 16   Wt 160 lb 1.6 oz (72.6 kg)   SpO2 96%   BMI 30.25 kg/m   Wt Readings from Last 3 Encounters:  04/03/16 160 lb 1.6 oz (72.6 kg)  03/30/16 160 lb 6.4 oz (72.8 kg)  03/23/16 160 lb 3.2 oz (72.7 kg)    Physical Exam  Constitutional: She appears well-developed and well-nourished. No distress.  Elderly female, no distress; obese, weight stable  Eyes: EOM are normal. No scleral icterus.  Neck: No thyromegaly present.  Cardiovascular: Normal rate, regular rhythm and normal heart sounds.   No murmur heard. Pulmonary/Chest: Effort normal and breath sounds normal. No respiratory distress. She has no wheezes.  Abdominal: Soft. Bowel sounds are normal. She exhibits no distension.  Musculoskeletal: She exhibits no edema.       Right knee: She exhibits decreased range of motion and swelling. She exhibits no deformity and no erythema.       Right lower leg: She exhibits deformity (soft mass lateral aspect RIGHT leg).  Neurological: She is alert. She displays no tremor.  Skin: Skin is warm and dry. She is not diaphoretic. No pallor.  Psychiatric: She has a normal mood and affect. Her behavior is normal. Judgment and thought content normal. Her mood appears not anxious. She does not exhibit a depressed mood.   Diabetic Foot Form - Detailed   Diabetic Foot Exam - detailed Diabetic Foot exam was performed with the following findings:  Yes 04/03/2016 11:12 AM  Visual Foot Exam completed.:  Yes  Normal Range of Motion:  Yes Pulse Foot Exam  completed.:  Yes  Right Dorsalis Pedis:  Present Left Dorsalis Pedis:  Present  Sensory Foot Exam Completed.:  Yes Swelling:  No Semmes-Weinstein Monofilament Test R Site 1-Great Toe:  Pos L Site 1-Great Toe:  Pos  R Site 4:  Pos L Site 4:  Pos  R Site 5:  Pos L Site 5:  Pos          Assessment & Plan:   Problem List Items Addressed This Visit      Cardiovascular and Mediastinum   Hypertension goal BP (blood pressure) < 140/90 (Chronic)    BP controlled today        Endocrine   DM (diabetes mellitus) type II controlled, neurological manifestation (HCC) - Primary    Check A1c today; foot exam by MD; limit white rice, white flour, white rice, sodas, etc.      Relevant Orders   Hemoglobin A1c (Completed)   Microalbumin / creatinine urine ratio (Completed)     Nervous and Auditory   Confusion    With memory loss; will start with labs; daughter does not think head CT needed; going on for a while; return for memory testing      Relevant Orders   TSH (Completed)   Vitamin B12 (Completed)   VITAMIN D 25 Hydroxy (Vit-D Deficiency, Fractures) (Completed)     Musculoskeletal and Integument   Skin lump of leg    Will get [place]      Relevant Orders   [place] Extrem Heeia   Right knee sprain    Seeing orthopaedist        Genitourinary   Chronic interstitial cystitis    Managed by urology        Other   Medication monitoring encounter    Check labs today      Relevant Orders   CBC with Differential/Platelet (Completed)   COMPLETE METABOLIC PANEL WITH GFR (Completed)   Hypercholesteremia    Check lipids; current labs on every other day medicine; limit or avoid eggs, fatty meats, saturated fats      Relevant Orders   Lipid panel (Completed)   Gait abnormality    Offered therapy; she declined; continue exercises at home; be safe      Relevant Orders   TSH (Completed)   Vitamin B12 (Completed)   VITAMIN D 25 Hydroxy (Vit-D Deficiency, Fractures)  (Completed)    Other Visit Diagnoses    Needs flu shot       Relevant Orders   Flu vaccine HIGH DOSE PF (Fluzone High dose) (Completed)      Follow up plan: Return for memory testing and stools.  An after-visit summary was printed and given to the patient at [facility].  Please see the patient instructions which may contain other information and recommendations beyond what is mentioned above in the assessment and plan.  No orders of the defined types were placed in this encounter.   Orders Placed This Encounter  Procedures  . [place] Extrem Low Right Ltd  . Flu vaccine HIGH DOSE PF (Fluzone High dose)  . CBC with Differential/Platelet  . Hemoglobin A1c  . Microalbumin / creatinine urine ratio  . COMPLETE METABOLIC PANEL WITH GFR  . TSH  . Vitamin B12  . VITAMIN D 25 Hydroxy (Vit-D Deficiency, Fractures)  . Lipid panel

## 2016-04-03 NOTE — Patient Instructions (Addendum)
You received the flu shot today; it should protect you against the flu virus over the coming months; it will take about two weeks for antibodies to develop; do try to stay away from hospitals, nursing homes, and daycares during peak flu season; taking extra vitamin C daily during flu season may help you avoid getting sick  Please do see your eye doctor regularly, and have your eyes examined every year (or more often per his or her recommendation) Check your feet every night and let me know right away of any sores, infections, numbness, etc. Try to limit sweets, white bread, white rice, white potatoes It is okay with me for you to not check your fingerstick blood sugars (per [employer] of Endocrinology Best Practices), unless you are interested and feel it would be helpful for you  Return in 1-2 weeks for memory testing and discussion about stools

## 2016-04-04 ENCOUNTER — Telehealth: Payer: Self-pay | Admitting: Family Medicine

## 2016-04-04 ENCOUNTER — Other Ambulatory Visit: Payer: Self-pay | Admitting: Family Medicine

## 2016-04-04 ENCOUNTER — Other Ambulatory Visit: Payer: Self-pay | Admitting: *Deleted

## 2016-04-04 LAB — MICROALBUMIN / CREATININE URINE RATIO
Creatinine, Urine: 221 mg/dL (ref 20–320)
Microalb Creat Ratio: 5 ug/mg{creat}
Microalb, Ur: 1.2 mg/dL

## 2016-04-04 LAB — VITAMIN B12: Vitamin B-12: 1031 pg/mL (ref 200–1100)

## 2016-04-04 LAB — VITAMIN D 25 HYDROXY (VIT D DEFICIENCY, FRACTURES): VIT D 25 HYDROXY: 52 ng/mL (ref 30–100)

## 2016-04-04 MED ORDER — ATORVASTATIN CALCIUM 20 MG PO TABS: 20.0000 mg | ORAL_TABLET | Freq: Every day | ORAL | 1 refills | Status: DC

## 2016-04-04 NOTE — Progress Notes (Signed)
Rx for statin sent

## 2016-04-04 NOTE — Patient Outreach (Signed)
[facility]) Care Management  04/04/2016  [name] 02-Apr-1930 [id number]   Phone call to pateint to explore community resource needs  for in home assistance.  Per patient she admits to frequent falls, despite the use of her assistive devices(cain and walker) and outpatient therapy.  Per patient, she would love to move back to an assisted living, however her husband does not want to move again.  Patient and her spouse live in a retirement home at this time.  Per patient, her daughter and son-in law help with the household needs as best as they can, however patient's daughter has her own medical issues.  Patient's daughter, does fill  her pill box and transports her to all of her medical appointments.  Per patient, she and her spouse had a previous stay at an assisted living but hade to move due to finances.  They lived with her daughter in [place] for approximately 3 years and have been living in senior housing for the last 3 years.  Patient states difficulty keeping up with the upkeep of the home and multiple co-pays to see specialist. Due to their fixed income, patient cannot afford personal care services.  Per patient, they have applied for VA benefits and have been denied.   Home visit scheduled for 04/09/16 to provide community resources for possible assistance.  Plan:  [facility] all inclusive program to be discussed as well as [place].    [name] [facility] Care Management [phone]

## 2016-04-04 NOTE — Progress Notes (Signed)
Decrease vit D to 5,000 iu weekly; stop ibuprofen Ask pt about statin

## 2016-04-04 NOTE — Telephone Encounter (Signed)
I called scheduling and found the right test code for the [place] Thank you for the reminder Please call pt and let her know to STOP the Crestor and start atorvastatin

## 2016-04-05 DIAGNOSIS — M2391 Unspecified internal derangement of right knee: Secondary | ICD-10-CM | POA: Diagnosis not present

## 2016-04-05 DIAGNOSIS — M1711 Unilateral primary osteoarthritis, right knee: Secondary | ICD-10-CM | POA: Diagnosis not present

## 2016-04-05 NOTE — Telephone Encounter (Signed)
notified

## 2016-04-06 ENCOUNTER — Telehealth: Payer: Self-pay | Admitting: Family Medicine

## 2016-04-06 ENCOUNTER — Ambulatory Visit: Payer: Commercial Managed Care - HMO | Admitting: Urology

## 2016-04-06 ENCOUNTER — Encounter: Payer: Self-pay | Admitting: Urology

## 2016-04-06 NOTE — Telephone Encounter (Signed)
Please verify with patient that this is HER wish The lump could be nothing, just a lipoma or benign fatty tumor, or it could be cancer; we won't know unless we check it out She has the right to refuse the test, but we just want to make sure she is aware and that is her desire

## 2016-04-10 ENCOUNTER — Encounter: Payer: Self-pay | Admitting: Urology

## 2016-04-10 ENCOUNTER — Other Ambulatory Visit: Payer: Self-pay | Admitting: *Deleted

## 2016-04-10 ENCOUNTER — Ambulatory Visit (INDEPENDENT_AMBULATORY_CARE_PROVIDER_SITE_OTHER): Payer: Commercial Managed Care - HMO | Admitting: Urology

## 2016-04-10 VITALS — BP 181/81 | HR 73 | Ht 61.0 in | Wt 156.8 lb

## 2016-04-10 DIAGNOSIS — N3010 Interstitial cystitis (chronic) without hematuria: Secondary | ICD-10-CM | POA: Diagnosis not present

## 2016-04-10 LAB — URINALYSIS, COMPLETE
Bilirubin, UA: NEGATIVE
Glucose, UA: NEGATIVE
Ketones, UA: NEGATIVE
NITRITE UA: NEGATIVE
PH UA: 5 (ref 5.0–7.5)
Specific Gravity, UA: 1.025 (ref 1.005–1.030)
UUROB: 0.2 mg/dL (ref 0.2–1.0)

## 2016-04-10 LAB — MICROSCOPIC EXAMINATION: Epithelial Cells (non renal): >10 /hpf — AB (ref 0–10)

## 2016-04-10 MED ORDER — SODIUM BICARBONATE 8.4 % IV SOLN
11.0000 mL | Freq: Once | INTRAVENOUS | Status: AC
  Administered 2016-04-10: 11 mL

## 2016-04-10 NOTE — Patient Outreach (Signed)
[name]) Care Management  [name] Social Work  04/10/2016  [name] January 07, 1930 [id number]  Subjective:  Patient is a 82 year female, currently living in independent living for seniors with her husband.  Patient admits to frequent falls, difficulty hearing and seeing.  Per patient, she understands that there is not much she can do about her vision, however she could benefit from a hearing aid in her right ear.  Objective:   Encounter Medications:  Outpatient Encounter Prescriptions as of 04/10/2016  Medication Sig Note  . acetaminophen (TYLENOL) 500 MG tablet Take by mouth. 12/29/2015: Received from: [name]  . aspirin EC 81 MG tablet Take 1 tablet (81 mg total) by mouth daily.   Marland Kitchen atorvastatin (LIPITOR) 20 MG tablet Take 1 tablet (20 mg total) by mouth at bedtime.   Marland Kitchen b complex vitamins tablet Take 1 tablet by mouth daily.   . Carboxymethylcell-Hypromellose (GENTEAL OP) Apply 1 drop to eye. Each eye as needed   . Cholecalciferol (VITAMIN D3) 50000 units CAPS Take 5,000 Int'l Units/day by mouth once a week. Reported on 01/25/2016   . Cyanocobalamin (VITAMIN B-12) 1000 MCG SUBL  03/16/2016: Received from: [name]  . dicyclomine (BENTYL) 10 MG capsule Take by mouth. 12/29/2015: Received from: [name]  . erythromycin ophthalmic ointment  03/16/2016: Received from: [name]  . ferrous sulfate 325 (65 FE) MG tablet Take 1 tablet (325 mg total) by mouth daily with breakfast. (Patient taking differently: Take 325 mg by mouth as needed. )   . fluticasone (FLONASE) 50 MCG/ACT nasal spray SPRAY 2 SPRAYS EACH INTO BOTH NOSTRILS ONCE DAILY EACH NIGHT. 10/24/2015: Received from: [name]  . hydrocortisone (ANUSOL-HC) 25 MG suppository Place 1 suppository (25 mg total) rectally 2 (two) times daily.   Marland Kitchen loperamide (IMODIUM A-D) 2 MG tablet Take 2 mg by mouth 4 (four) times daily as needed for  diarrhea or loose stools.   . meclizine (ANTIVERT) 25 MG tablet Take 1 tablet (25 mg total) by mouth every 8 (eight) hours as needed for dizziness.   . meloxicam (MOBIC) 7.5 MG tablet Take by mouth. 03/16/2016: Received from: [name]: Take 1 tablet (7.5 mg total) by mouth 2 (two) times daily after meals.  . metFORMIN (GLUCOPHAGE) 500 MG tablet Take 1 tablet (500 mg total) by mouth 2 (two) times daily with a meal.   . Meth-Hyo-M Bl-Na Phos-Ph Sal (URIBEL) 118 MG CAPS ONE TABLET FOUR TIMES DAILY AS NEEDED WITH 8 OZ OF WATER (Patient not taking: Reported on 03/30/2016)   . Misc. Devices ([employer]) MISC Rolling walker; knee sprain, arthritis; LON 99 months   . neomycin-polymyxin-hydrocortisone (CORTISPORIN) 3.5-10000-1 otic suspension Reported on 01/05/2016 12/22/2015: Received from: [name]  . nystatin cream (MYCOSTATIN) Apply 1 application topically 2 (two) times daily.   Marland Kitchen omeprazole (PRILOSEC) 40 MG capsule Take 40 mg by mouth daily.  11/30/2015: Received from: [name]  . ondansetron (ZOFRAN) 4 MG tablet Take 4 mg by mouth every 8 (eight) hours as needed for nausea or vomiting. Reported on 12/22/2015 01/12/2016: PRN  . Probiotic Product (PROBIOTIC COLON SUPPORT) CAPS Take 1 capsule by mouth daily.  08/15/2015: Takes 1 capsule by mouth daily at night  . sertraline (ZOLOFT) 50 MG tablet Take 1 tablet (50 mg total) by mouth daily.   . Simethicone (GAS-X EXTRA STRENGTH) 125 MG CAPS Take 1 capsule by mouth as needed. Reported  on 11/24/2015   . solifenacin (VESICARE) 5 MG tablet Take 5 mg by mouth daily.   . [EXPIRED] heparin-lidocaine-sod bicarb bladder irrigation (Bladder rescue for bladder instillation procedures)     No facility-administered encounter medications on file as of 04/10/2016.     Functional Status:  In your present state of health, do you have any difficulty performing the following activities: 04/03/2016 04/02/2016  Hearing? [name]   Vision? Y Y  Difficulty concentrating or making decisions? N N  Walking or climbing stairs? N Y  Dressing or bathing? N N  Doing errands, shopping? [name]  Preparing Food and eating ? - Y  Using the Toilet? - N  In the past six months, have you accidently leaked urine? - Y  Do you have problems with loss of bowel control? - N  Managing your Medications? - N  Managing your Finances? - N  Housekeeping or managing your Housekeeping? - Y  Some recent data might be hidden    Fall/Depression Screening:  PHQ 2/9 Scores 04/10/2016 04/03/2016 04/02/2016 02/29/2016 02/02/2016 12/06/2015 11/25/2015  PHQ - 2 Score 0 0 1 1 1  0 1  PHQ- 9 Score - - - - - - -   Pateint reports frequent falls, " I am not sure why I fall so much" . Patient has a Engineer, civil (consulting) and cain.  It was emphasized that she use these devices to help her to avoid falls.  Patient reports residing in [name]  in the past but had to move because they could not afford it. Patient and spouse now live in an [name] living apartment.  Patient and spouse both report having benefited from residing in the assisted living, however are very hesitant to move to one again.  Per patient, her spouse and her daughter transport her to her medical appointments. Patient manages her own medicines. Patient and spouse take care of the housekeeping as well, however both report they may not be able to do it for long. Patient's daughter and son-in-law assist [name].   Patient discussed not having much socialization. Participation in the activities at the [name] discussed. Patient declined. [name]  Information on free over the counter medicine give away on 06/01/16 9am-2pm given to patient as well as a Warden/ranger  of [name]. Patient declined housekeeping resources, stating "we will try a little bit longer to do it ourselves" however resources provided for future use.   [name] all inclusive care  discussed as an option as well. Contact information provided.   Patient may qualify for one at no cost Hearing aid through the telephonic Equipment Distribution Program.  Per patient, she believes she will qualify and has had a hearing test within the last 6 months.     Plan: This Education officer, museum will assist patient with the eligibility process for a hearing aid through the telephonic equipment distribution program. This social worker will follow up within 1 month   [place], [place] Management [phone]'

## 2016-04-10 NOTE — Telephone Encounter (Signed)
She went to ortho the very next day and he thought it was not needed

## 2016-04-10 NOTE — Progress Notes (Signed)
Bladder Rescue Solution Instillation  Due to interstitial cystitis patient is present today for a Rescue Solution Treatment.  Patient was cleaned and prepped in a sterile fashion with betadine and lidocaine 1% jelly was instilled into the urethra.  A 14 FR catheter was inserted, urine return was noted 29ml, urine was yellow in color.  Instilled a solution consisting of 59ml of Sodium Bicarb, 2 ml Lidocaine and 1 ml of Heparin. The catheter was then removed. Patient tolerated well, no complications were noted.   Performed by: Elberta Leatherwood, CMA  Follow up/ Additional Notes: 1 week for Rescue Solution

## 2016-04-10 NOTE — Progress Notes (Signed)
04/10/2016 9:34 AM   [name] 1929/09/15 [id number]  Referring provider: Arnetha Courser, MD [address] [place], [place] [address]  Chief Complaint  Patient presents with  . Follow-up    Rescue solution    HPI: [name] is a 82yo here for rescue solution for IC. She has dysuria and uses premarin qhs with slight improvement in the past 2 weeks. She denies frequency, urgency. No gross hematuria.    PMH: Past Medical History:  Diagnosis Date  . Anxiety and depression   . Cancer (HCC)    breast  . Carpal tunnel syndrome   . Chest pain    SECONDARY TO GERD  . Chronic interstitial cystitis   . Chronic right hip pain   . Depression   . Diabetes mellitus without complication ([place])   . Diverticulosis   . Dyslipidemia   . Essential hypertension, benign   . Fibromyalgia   . Frequent falls   . GERD (gastroesophageal reflux disease)   . Hearing loss of left ear   . History of fractured rib   . History of TIA (transient ischemic attack)   . Irritable bowel   . Diane (multiple sclerosis) ([place])   . Osteoarthritis   . Paroxysmal supraventricular tachycardia ([place])   . Peripheral neuropathy (HCC)    MILD  . Protein malnutrition ([place])   . Pure hypercholesterolemia   . Recurrent UTI   . SOB (shortness of breath)    SECONDARY TO REFLUX  . Solitary pulmonary nodule on lung CT 12/04/2015   3 mm LLL lung nodule June 2016, March 2017  . Tachycardia, paroxysmal (HCC)    Reportedly Paroxysmal Supraventricular Tachycardia, but unable to find documentation confirming this  . Thyroid nodule   . Vertigo   . Vitamin B 12 deficiency     Surgical History: Past Surgical History:  Procedure Laterality Date  . APPENDECTOMY    . BLADDER TACKING     X 3  . BREAST LUMPECTOMY    . BREAST SURGERY    . CARDIAC CATHETERIZATION  10/2001   Normal coronary arteries with the exception of 20% proximal D1  . CHOLECYSTECTOMY    . COLONOSCOPY WITH PROPOFOL N/A 01/21/2015   Procedure: COLONOSCOPY WITH PROPOFOL;  Surgeon: Josefine Class, MD;  Location: [facility] ENDOSCOPY;  Service: Endoscopy;  Laterality: N/A;  . ESOPHAGOGASTRODUODENOSCOPY N/A 01/21/2015   Procedure: ESOPHAGOGASTRODUODENOSCOPY (EGD);  Surgeon: Josefine Class, MD;  Location: [facility] ENDOSCOPY;  Service: Endoscopy;  Laterality: N/A;  . EYE SURGERIES     WITH BUCKLE DETACHMENT OF THE RETINA  . HEMORRHOID SURGERY     WITH RECONSTRUCTION  . MASTECTOMY Right    DX MASTITIS/NO CANCER.Marland KitchenSALINE IMPLANT  . MASTECTOMY    . TONSILLECTOMY    . TOTAL ABDOMINAL HYSTERECTOMY W/ BILATERAL SALPINGOOPHORECTOMY    . TYMPANOPLASTY      Home Medications:    Medication List       Accurate as of 04/10/16  9:34 AM. Always use your most recent med list.          acetaminophen 500 MG tablet Commonly known as:  TYLENOL Take by mouth.   aspirin EC 81 MG tablet Take 1 tablet (81 mg total) by mouth daily.   atorvastatin 20 MG tablet Commonly known as:  LIPITOR Take 1 tablet (20 mg total) by mouth at bedtime.   b complex vitamins tablet Take 1 tablet by mouth daily.   dicyclomine 10 MG capsule Commonly known as:  BENTYL  Take by mouth.   erythromycin ophthalmic ointment   ferrous sulfate 325 (65 FE) MG tablet Take 1 tablet (325 mg total) by mouth daily with breakfast.   fluticasone 50 MCG/ACT nasal spray Commonly known as:  FLONASE SPRAY 2 SPRAYS EACH INTO BOTH NOSTRILS ONCE DAILY EACH NIGHT.   GAS-X EXTRA STRENGTH 125 MG Caps Generic drug:  Simethicone Take 1 capsule by mouth as needed. Reported on 11/24/2015   GENTEAL OP Apply 1 drop to eye. Each eye as needed   [place] Rolling walker; knee sprain, arthritis; LON 99 months   hydrocortisone 25 MG suppository Commonly known as:  ANUSOL-HC Place 1 suppository (25 mg total) rectally 2 (two) times daily.   loperamide 2 MG tablet Commonly known as:  IMODIUM A-D Take 2 mg by mouth 4 (four) times daily as needed for  diarrhea or loose stools.   meclizine 25 MG tablet Commonly known as:  ANTIVERT Take 1 tablet (25 mg total) by mouth every 8 (eight) hours as needed for dizziness.   meloxicam 7.5 MG tablet Commonly known as:  MOBIC Take by mouth.   metFORMIN 500 MG tablet Commonly known as:  GLUCOPHAGE Take 1 tablet (500 mg total) by mouth 2 (two) times daily with a meal.   neomycin-polymyxin-hydrocortisone 3.5-10000-1 otic suspension Commonly known as:  CORTISPORIN Reported on 01/05/2016   nystatin cream Commonly known as:  MYCOSTATIN Apply 1 application topically 2 (two) times daily.   omeprazole 40 MG capsule Commonly known as:  PRILOSEC Take 40 mg by mouth daily.   ondansetron 4 MG tablet Commonly known as:  ZOFRAN Take 4 mg by mouth every 8 (eight) hours as needed for nausea or vomiting. Reported on 12/22/2015   PROBIOTIC COLON SUPPORT Caps Take 1 capsule by mouth daily.   sertraline 50 MG tablet Commonly known as:  ZOLOFT Take 1 tablet (50 mg total) by mouth daily.   solifenacin 5 MG tablet Commonly known as:  VESICARE Take 5 mg by mouth daily.   URIBEL 118 MG Caps ONE TABLET FOUR TIMES DAILY AS NEEDED WITH 8 OZ OF WATER   Vitamin B-12 1000 MCG Subl   Vitamin D3 50000 units Caps Take 5,000 Int'l Units/day by mouth once a week. Reported on 01/25/2016       Allergies:  Allergies  Allergen Reactions  . Biaxin [Clarithromycin] Other (See Comments) and Diarrhea  . Copaxone [Glatiramer Acetate]   . Decadron [Dexamethasone] Nausea And Vomiting and Other (See Comments)    Other reaction(s): Muscle Pain Reaction:  Abdominal pain  . Dexamethasone Sodium Phosphate Other (See Comments)  . Diltiazem Hcl Other (See Comments)    Reaction:  Unknown   . Diltiazem Hcl Other (See Comments)  . Flagyl [Metronidazole] Nausea Only and Diarrhea    Other reaction(s): Vomiting  . Gabapentin Other (See Comments)    "burning all over" Reaction:  Unknown   . Iohexol Swelling and Other (See  Comments)     Desc: tongue swelling with "IVP dye" sccording to nurses notes with a lumbar myelo-12/09- asm, Onset Date: [id number]  Pts tongue swells.  Clementeen Hoof [Iodinated Diagnostic Agents] Swelling and Other (See Comments)    Passed out  Pts tongue swells.   . Levothyroxine Nausea Only  . Sulfa Antibiotics Other (See Comments)    Reaction:  Unknown   . Sulfonamide Derivatives Other (See Comments)    Reaction:  Unknown   . Synthroid [Levothyroxine Sodium]   . Copaxone  [Glatiramer] Rash  .  Interferon Beta-1a Other (See Comments) and Rash    Reaction:  Unknown     Family History: Family History  Problem Relation Age of Onset  . Arthritis Sister   . Aneurysm Brother   . Heart disease Daughter   . ALS Mother   . Colon cancer    . Kidney disease Sister     Social History:  reports that she has never smoked. She has never used smokeless tobacco. She reports that she does not drink alcohol or use drugs.  ROS: UROLOGY Frequent Urination?: No Hard to postpone urination?: Yes Burning/pain with urination?: Yes Get up at night to urinate?: Yes Leakage of urine?: No Urine stream starts and stops?: No Trouble starting stream?: No Do you have to strain to urinate?: No Blood in urine?: No Urinary tract infection?: No Sexually transmitted disease?: No Injury to kidneys or bladder?: No Painful intercourse?: No Weak stream?: No Currently pregnant?: No Vaginal bleeding?: No Last menstrual period?: n  Gastrointestinal Nausea?: No Vomiting?: No Indigestion/heartburn?: Yes Diarrhea?: Yes Constipation?: Yes  Constitutional Fever: No Night sweats?: Yes Weight loss?: No Fatigue?: Yes  Skin Skin rash/lesions?: No Itching?: No  Eyes Blurred vision?: Yes Double vision?: Yes  Ears/Nose/Throat Sore throat?: No Sinus problems?: Yes  Hematologic/Lymphatic Swollen glands?: No Easy bruising?: No  Cardiovascular Leg swelling?: Yes Chest pain?: No  Respiratory Cough?:  No Shortness of breath?: No  Endocrine Excessive thirst?: No  Musculoskeletal Back pain?: Yes Joint pain?: Yes  Neurological Headaches?: Yes Dizziness?: Yes  Psychologic Depression?: Yes Anxiety?: Yes  Physical Exam: BP (!) 181/81 (BP Location: Left Arm, Patient Position: Sitting, Cuff Size: Normal)   Pulse 73   Ht _0  (1.549 m)   Wt 71.1 kg (156 lb 12.8 oz)   BMI 29.63 kg/m   Constitutional:  Alert and oriented, No acute distress. HEENT: [place] AT, moist mucus membranes.  Trachea midline, no masses. Cardiovascular: No clubbing, cyanosis, or edema. Respiratory: Normal respiratory effort, no increased work of breathing. GI: Abdomen is soft, nontender, nondistended, no abdominal masses GU: No CVA tenderness.  Skin: No rashes, bruises or suspicious lesions. Lymph: No cervical or inguinal adenopathy. Neurologic: Grossly intact, no focal deficits, moving all 4 extremities. Psychiatric: Normal mood and affect.  Laboratory Data: Lab Results  Component Value Date   WBC 4.4 04/03/2016   HGB 12.9 04/03/2016   HCT 39.7 04/03/2016   MCV 92.3 04/03/2016   PLT 186 04/03/2016    Lab Results  Component Value Date   CREATININE 1.10 (H) 04/03/2016    No results found for: PSA  No results found for: TESTOSTERONE  Lab Results  Component Value Date   HGBA1C 6.5 (H) 04/03/2016    Urinalysis    Component Value Date/Time   COLORURINE YELLOW (A) 08/03/2015 2121   APPEARANCEUR Cloudy (A) 03/30/2016 1034   LABSPEC 1.024 08/03/2015 2121   LABSPEC 1.021 11/12/2014 1650   PHURINE 5.0 08/03/2015 2121   GLUCOSEU Negative 03/30/2016 1034   GLUCOSEU Negative 11/12/2014 1650   GLUCOSEU NEGATIVE 06/26/2010 1442   HGBUR 2+ (A) 08/03/2015 2121   BILIRUBINUR Negative 03/30/2016 1034   BILIRUBINUR Negative 11/12/2014 1650   KETONESUR NEGATIVE 08/03/2015 2121   PROTEINUR 1+ (A) 03/30/2016 1034   PROTEINUR NEGATIVE 08/03/2015 2121   UROBILINOGEN 0.2 06/26/2010 1442   NITRITE  Negative 03/30/2016 1034   NITRITE NEGATIVE 08/03/2015 2121   LEUKOCYTESUR 2+ (A) 03/30/2016 1034   LEUKOCYTESUR Negative 11/12/2014 1650    Pertinent Imaging: none  Assessment & Plan:  1. Interstitial cystitis -RTC 1 week for rescue solution - Urinalysis, Complete - heparin-lidocaine-sod bicarb bladder irrigation (Bladder rescue for bladder instillation procedures); Irrigate with 11 mLs as directed once.   No Follow-up on file.  Nicolette Bang, MD  [facility] [address] [place], [place] [address] [phone]

## 2016-04-11 ENCOUNTER — Ambulatory Visit: Payer: Commercial Managed Care - HMO

## 2016-04-12 ENCOUNTER — Encounter: Payer: Self-pay | Admitting: *Deleted

## 2016-04-13 ENCOUNTER — Other Ambulatory Visit: Payer: Self-pay | Admitting: *Deleted

## 2016-04-13 NOTE — Patient Outreach (Signed)
[facility]) Care Management  04/13/2016  [name] 28-Nov-1929 [id number]   Phone call from patient's daughter stating that patient received a hearing test through [facility] in March of this year.  Phone call to [employer], [phone]  to see if they are a-contracted with the telephonic Equipment Distribution Program through the Department of Health and [employer]. It was confirmed that they are no contracted.  Plan:  Patient to be informed that she will have to have another hearing test done with a doctor that is contracted with the Department of Health and Human Services in order to be considered for the telephonic equipment distribution program.    [place], [name] Management [phone]

## 2016-04-16 ENCOUNTER — Ambulatory Visit (INDEPENDENT_AMBULATORY_CARE_PROVIDER_SITE_OTHER): Payer: Commercial Managed Care - HMO | Admitting: Urology

## 2016-04-16 ENCOUNTER — Encounter: Payer: Self-pay | Admitting: Urology

## 2016-04-16 VITALS — BP 159/82 | HR 72 | Ht 63.0 in | Wt 158.0 lb

## 2016-04-16 DIAGNOSIS — N3010 Interstitial cystitis (chronic) without hematuria: Secondary | ICD-10-CM | POA: Diagnosis not present

## 2016-04-16 LAB — URINALYSIS, COMPLETE
Bilirubin, UA: NEGATIVE
GLUCOSE, UA: NEGATIVE
KETONES UA: NEGATIVE
NITRITE UA: NEGATIVE
PROTEIN UA: NEGATIVE
SPEC GRAV UA: 1.025 (ref 1.005–1.030)
UUROB: 0.2 mg/dL (ref 0.2–1.0)
pH, UA: 5 (ref 5.0–7.5)

## 2016-04-16 LAB — MICROSCOPIC EXAMINATION: RBC MICROSCOPIC, UA: NONE SEEN /HPF (ref 0–?)

## 2016-04-16 MED ORDER — SODIUM BICARBONATE 8.4 % IV SOLN
11.0000 mL | Freq: Once | INTRAVENOUS | Status: AC
  Administered 2016-04-16: 11 mL

## 2016-04-16 NOTE — Addendum Note (Signed)
Addended by: Wilson Singer on: 04/16/2016 11:41 AM   Modules accepted: Orders

## 2016-04-16 NOTE — Progress Notes (Signed)
04/16/2016 9:40 AM   [name] 1930-06-17 [id number]  Referring provider: Arnetha Courser, MD [address] [place], [place] [address]  Chief Complaint  Patient presents with  . Follow-up    Interstitial cystitis    HPI: [name] is a 82yo here for rescue solution for IC. She has dysuria and uses premarin qhs. She has improved over the last few weeks and wants to take a few weeks off. No gross hematuria.    PMH: Past Medical History:  Diagnosis Date  . Anxiety and depression   . Cancer (HCC)    breast  . Carpal tunnel syndrome   . Chest pain    SECONDARY TO GERD  . Chronic interstitial cystitis   . Chronic right hip pain   . Depression   . Diabetes mellitus without complication ([place])   . Diverticulosis   . Dyslipidemia   . Essential hypertension, benign   . Fibromyalgia   . Frequent falls   . GERD (gastroesophageal reflux disease)   . Hearing loss of left ear   . History of fractured rib   . History of TIA (transient ischemic attack)   . Irritable bowel   . Diane (multiple sclerosis) ([place])   . Osteoarthritis   . Paroxysmal supraventricular tachycardia ([place])   . Peripheral neuropathy (HCC)    MILD  . Protein malnutrition ([place])   . Pure hypercholesterolemia   . Recurrent UTI   . SOB (shortness of breath)    SECONDARY TO REFLUX  . Solitary pulmonary nodule on lung CT 12/04/2015   3 mm LLL lung nodule June 2016, March 2017  . Tachycardia, paroxysmal (HCC)    Reportedly Paroxysmal Supraventricular Tachycardia, but unable to find documentation confirming this  . Thyroid nodule   . Vertigo   . Vitamin B 12 deficiency     Surgical History: Past Surgical History:  Procedure Laterality Date  . APPENDECTOMY    . BLADDER TACKING     X 3  . BREAST LUMPECTOMY    . BREAST SURGERY    . CARDIAC CATHETERIZATION  10/2001   Normal coronary arteries with the exception of 20% proximal D1  . CHOLECYSTECTOMY    . COLONOSCOPY WITH PROPOFOL N/A 01/21/2015   Procedure: COLONOSCOPY WITH PROPOFOL;  Surgeon: Josefine Class, MD;  Location: [facility] ENDOSCOPY;  Service: Endoscopy;  Laterality: N/A;  . ESOPHAGOGASTRODUODENOSCOPY N/A 01/21/2015   Procedure: ESOPHAGOGASTRODUODENOSCOPY (EGD);  Surgeon: Josefine Class, MD;  Location: [facility] ENDOSCOPY;  Service: Endoscopy;  Laterality: N/A;  . EYE SURGERIES     WITH BUCKLE DETACHMENT OF THE RETINA  . HEMORRHOID SURGERY     WITH RECONSTRUCTION  . MASTECTOMY Right    DX MASTITIS/NO CANCER.Marland KitchenSALINE IMPLANT  . MASTECTOMY    . TONSILLECTOMY    . TOTAL ABDOMINAL HYSTERECTOMY W/ BILATERAL SALPINGOOPHORECTOMY    . TYMPANOPLASTY      Home Medications:    Medication List       Accurate as of 04/16/16  9:40 AM. Always use your most recent med list.          acetaminophen 500 MG tablet Commonly known as:  TYLENOL Take by mouth.   aspirin EC 81 MG tablet Take 1 tablet (81 mg total) by mouth daily.   atorvastatin 20 MG tablet Commonly known as:  LIPITOR Take 1 tablet (20 mg total) by mouth at bedtime.   b complex vitamins tablet Take 1 tablet by mouth daily.   dicyclomine 10 MG capsule Commonly  known as:  BENTYL Take by mouth.   erythromycin ophthalmic ointment   ferrous sulfate 325 (65 FE) MG tablet Take 1 tablet (325 mg total) by mouth daily with breakfast.   fluticasone 50 MCG/ACT nasal spray Commonly known as:  FLONASE SPRAY 2 SPRAYS EACH INTO BOTH NOSTRILS ONCE DAILY EACH NIGHT.   GAS-X EXTRA STRENGTH 125 MG Caps Generic drug:  Simethicone Take 1 capsule by mouth as needed. Reported on 11/24/2015   GENTEAL OP Apply 1 drop to eye. Each eye as needed   [place] Rolling walker; knee sprain, arthritis; LON 99 months   hydrocortisone 25 MG suppository Commonly known as:  ANUSOL-HC Place 1 suppository (25 mg total) rectally 2 (two) times daily.   loperamide 2 MG tablet Commonly known as:  IMODIUM A-D Take 2 mg by mouth 4 (four) times daily as needed for  diarrhea or loose stools.   meclizine 25 MG tablet Commonly known as:  ANTIVERT Take 1 tablet (25 mg total) by mouth every 8 (eight) hours as needed for dizziness.   metFORMIN 500 MG tablet Commonly known as:  GLUCOPHAGE Take 1 tablet (500 mg total) by mouth 2 (two) times daily with a meal.   neomycin-polymyxin-hydrocortisone 3.5-10000-1 otic suspension Commonly known as:  CORTISPORIN Reported on 01/05/2016   nystatin cream Commonly known as:  MYCOSTATIN Apply 1 application topically 2 (two) times daily.   omeprazole 40 MG capsule Commonly known as:  PRILOSEC Take 40 mg by mouth daily.   ondansetron 4 MG tablet Commonly known as:  ZOFRAN Take 4 mg by mouth every 8 (eight) hours as needed for nausea or vomiting. Reported on 12/22/2015   PROBIOTIC COLON SUPPORT Caps Take 1 capsule by mouth daily.   sertraline 50 MG tablet Commonly known as:  ZOLOFT Take 1 tablet (50 mg total) by mouth daily.   solifenacin 5 MG tablet Commonly known as:  VESICARE Take 5 mg by mouth daily.   URIBEL 118 MG Caps ONE TABLET FOUR TIMES DAILY AS NEEDED WITH 8 OZ OF WATER   Vitamin B-12 1000 MCG Subl   Vitamin D3 50000 units Caps Take 5,000 Int'l Units/day by mouth once a week. Reported on 01/25/2016       Allergies:  Allergies  Allergen Reactions  . Biaxin [Clarithromycin] Other (See Comments) and Diarrhea  . Copaxone [Glatiramer Acetate]   . Decadron [Dexamethasone] Nausea And Vomiting and Other (See Comments)    Other reaction(s): Muscle Pain Reaction:  Abdominal pain  . Dexamethasone Sodium Phosphate Other (See Comments)  . Diltiazem Hcl Other (See Comments)    Reaction:  Unknown   . Diltiazem Hcl Other (See Comments)  . Flagyl [Metronidazole] Nausea Only and Diarrhea    Other reaction(s): Vomiting  . Gabapentin Other (See Comments)    "burning all over" Reaction:  Unknown   . Iohexol Swelling and Other (See Comments)     Desc: tongue swelling with "IVP dye" sccording to nurses  notes with a lumbar myelo-12/09- asm, Onset Date: [id number]  Pts tongue swells.  Clementeen Hoof [Iodinated Diagnostic Agents] Swelling and Other (See Comments)    Passed out  Pts tongue swells.   . Levothyroxine Nausea Only  . Sulfa Antibiotics Other (See Comments)    Reaction:  Unknown   . Sulfonamide Derivatives Other (See Comments)    Reaction:  Unknown   . Synthroid [Levothyroxine Sodium]   . Copaxone  [Glatiramer] Rash  . Interferon Beta-1a Other (See Comments) and Rash  Reaction:  Unknown     Family History: Family History  Problem Relation Age of Onset  . Arthritis Sister   . Aneurysm Brother   . Heart disease Daughter   . ALS Mother   . Colon cancer    . Kidney disease Sister     Social History:  reports that she has never smoked. She has never used smokeless tobacco. She reports that she does not drink alcohol or use drugs.  ROS: UROLOGY Frequent Urination?: No Hard to postpone urination?: No Burning/pain with urination?: No Get up at night to urinate?: Yes Leakage of urine?: Yes Urine stream starts and stops?: No Trouble starting stream?: No Do you have to strain to urinate?: No Blood in urine?: No Urinary tract infection?: No Sexually transmitted disease?: No Injury to kidneys or bladder?: No Painful intercourse?: No Weak stream?: No Currently pregnant?: No Vaginal bleeding?: No Last menstrual period?: n  Gastrointestinal Nausea?: No Vomiting?: No Indigestion/heartburn?: Yes Diarrhea?: Yes Constipation?: Yes  Constitutional Fever: No Night sweats?: Yes Weight loss?: No Fatigue?: Yes  Skin Skin rash/lesions?: No Itching?: No  Eyes Blurred vision?: Yes Double vision?: No  Ears/Nose/Throat Sore throat?: No Sinus problems?: Yes  Hematologic/Lymphatic Swollen glands?: No Easy bruising?: No  Cardiovascular Leg swelling?: No Chest pain?: No  Respiratory Cough?: No Shortness of breath?: No  Endocrine Excessive thirst?:  No  Musculoskeletal Back pain?: Yes Joint pain?: Yes  Neurological Headaches?: Yes Dizziness?: Yes  Psychologic Depression?: Yes Anxiety?: Yes  Physical Exam: BP (!) 159/82   Pulse 72   Ht 5' 3"  (1.6 m)   Wt 71.7 kg (158 lb)   BMI 27.99 kg/m   Constitutional:  Alert and oriented, No acute distress. HEENT: [place] AT, moist mucus membranes.  Trachea midline, no masses. Cardiovascular: No clubbing, cyanosis, or edema. Respiratory: Normal respiratory effort, no increased work of breathing. Skin: No rashes, bruises or suspicious lesions. Lymph: No cervical or inguinal adenopathy. Neurologic: Grossly intact, no focal deficits, moving all 4 extremities. Psychiatric: Normal mood and affect.  Procedure: see nurse note for full details -- heparin-lidocaine-sod bicarb bladder irrigation (Bladder rescue for bladder instillation procedures).  Laboratory Data: Lab Results  Component Value Date   WBC 4.4 04/03/2016   HGB 12.9 04/03/2016   HCT 39.7 04/03/2016   MCV 92.3 04/03/2016   PLT 186 04/03/2016    Lab Results  Component Value Date   CREATININE 1.10 (H) 04/03/2016    No results found for: PSA  No results found for: TESTOSTERONE  Lab Results  Component Value Date   HGBA1C 6.5 (H) 04/03/2016    Urinalysis    Component Value Date/Time   COLORURINE YELLOW (A) 08/03/2015 2121   APPEARANCEUR Cloudy (A) 04/10/2016 0857   LABSPEC 1.024 08/03/2015 2121   LABSPEC 1.021 11/12/2014 1650   PHURINE 5.0 08/03/2015 2121   GLUCOSEU Negative 04/10/2016 0857   GLUCOSEU Negative 11/12/2014 1650   GLUCOSEU NEGATIVE 06/26/2010 1442   HGBUR 2+ (A) 08/03/2015 2121   BILIRUBINUR Negative 04/10/2016 0857   BILIRUBINUR Negative 11/12/2014 [place] 08/03/2015 2121   PROTEINUR 1+ (A) 04/10/2016 0857   PROTEINUR NEGATIVE 08/03/2015 2121   UROBILINOGEN 0.2 06/26/2010 1442   NITRITE Negative 04/10/2016 0857   NITRITE NEGATIVE 08/03/2015 2121   LEUKOCYTESUR 1+ (A)  04/10/2016 0857   LEUKOCYTESUR Negative 11/12/2014 1650     Assessment & Plan:    1. Interstitial cystitis -s/p rescue solution f/u PRN  - Urinalysis, Complete   No Follow-up on file.  Festus Aloe, [facility] [address], [place], [place] [address] [phone]

## 2016-04-16 NOTE — Progress Notes (Signed)
Bladder Rescue Solution Instillation  Due to IC patient is present today for a Rescue Solution Treatment.  Patient was cleaned and prepped in a sterile fashion with betadine and lidocaine 2% jelly was instilled into the urethra.  A 14 FR catheter was inserted, urine return was noted 2 ml, urine was yellow in color.  Instilled a solution consisting of 21ml of Sodium Bicarb, 2 ml Lidocaine and 1 ml of Heparin. The catheter was then removed. Patient tolerated well, no complications were noted.   Performed by: K.Russell,CMA

## 2016-04-17 ENCOUNTER — Other Ambulatory Visit: Payer: Self-pay | Admitting: *Deleted

## 2016-04-17 NOTE — Patient Outreach (Addendum)
[facility]) Care Management  04/17/2016  KYESHIA DECATUR 28-Feb-1930 [id number]   Phone call to patient to inform her that she would need a new hearing test with a doctor that contracts with the Department of Human Services for the Deaf and hard of hearing.  Contact information provided to the [facility]NT [phone]. Per patient, she was seen by them in the past but did not know when.  Phone call to the [facility]NT with the permission of patient. It was confirmed that she had not been seen by them since December 2015. Patient will need a referral from her doctor.   Plan:  Patient agrees to contact [facility] to schedule appointment to be assessed for a hearing aid. This Education officer, museum will follow up within 2 weeks.   [name] [facility] Care Management [phone]

## 2016-04-18 ENCOUNTER — Telehealth: Payer: Self-pay | Admitting: Family Medicine

## 2016-04-18 NOTE — Telephone Encounter (Signed)
Can please do this for me. Thanks

## 2016-04-19 NOTE — Telephone Encounter (Signed)
This was approved and patient notified by phone. Authorization [id number] from 04/19/2016-10/16/2016 for Dr. Tami Ribas. At [facility]

## 2016-04-23 ENCOUNTER — Other Ambulatory Visit: Payer: Self-pay | Admitting: *Deleted

## 2016-04-23 NOTE — Patient Outreach (Signed)
[facility]) Care Management  04/23/2016  KATRELL MALSCH Mar 10, 1930 [id number]   Phone call to patient to follow up on appointment for her hearing test.  Per patient, she called the ENT who stated that she needed a referral from her doctor in order to make an appointment. Per patient, the ENT office will contact her primary care office to request this.  Per patient, she has an appointment with her doctor on 04/24/16 and will follow up on the referral as well.  Plan:  This Education officer, museum will follow up with patient regarding the referral within 2 weeks.   [name] [facility] Care Management [phone]

## 2016-04-24 ENCOUNTER — Encounter: Payer: Self-pay | Admitting: Family Medicine

## 2016-04-24 ENCOUNTER — Ambulatory Visit (INDEPENDENT_AMBULATORY_CARE_PROVIDER_SITE_OTHER): Payer: Commercial Managed Care - HMO | Admitting: Family Medicine

## 2016-04-24 DIAGNOSIS — K582 Mixed irritable bowel syndrome: Secondary | ICD-10-CM | POA: Diagnosis not present

## 2016-04-24 DIAGNOSIS — F0390 Unspecified dementia without behavioral disturbance: Secondary | ICD-10-CM

## 2016-04-24 DIAGNOSIS — K222 Esophageal obstruction: Secondary | ICD-10-CM | POA: Diagnosis not present

## 2016-04-24 DIAGNOSIS — K591 Functional diarrhea: Secondary | ICD-10-CM

## 2016-04-24 DIAGNOSIS — K649 Unspecified hemorrhoids: Secondary | ICD-10-CM

## 2016-04-24 MED ORDER — DONEPEZIL HCL 5 MG PO TABS: 5.0000 mg | ORAL_TABLET | Freq: Every day | ORAL | 0 refills | Status: DC

## 2016-04-24 NOTE — Assessment & Plan Note (Signed)
Refer to GI 

## 2016-04-24 NOTE — Assessment & Plan Note (Signed)
Start Aricept; ask urologist about stopping vesicare; return in 2 months

## 2016-04-24 NOTE — Assessment & Plan Note (Signed)
Non-bleeding hemorrhoids, refer to GI

## 2016-04-24 NOTE — Patient Instructions (Addendum)
Start the new medicine for your memory Take 5 mg every evening before bed Do brain exercise, stay up-to-date with events and local happenings and weather Try to get enough sleep Eat healthy foods Try to increase activity I've entered the referral for the GI doctor We'll see if the urologist agrees with stopping Vesicare

## 2016-04-24 NOTE — Progress Notes (Signed)
BP 136/72   Pulse 83   Temp 97.9 F (36.6 C) (Oral)   Resp 14   Wt 158 lb (71.7 kg)   SpO2 96%   BMI 27.99 kg/m    Subjective:    Patient ID: [name], female    DOB: 1929/12/21, 82 y.o.   MRN: [id number]  HPI: [name] is a 82 y.o. female  Chief Complaint  Patient presents with  . Follow-up   She is here for f/u; here with her husband She is having problems with her memory She lost her phone book with all her numbers; she misplaces things like the phone Trouble getting names out Sometimes she doesn't talk plainly and she has to repeat herself for her husband She has hearing difficulty Patient does not drink alcohol She has been taking vesicare for a little while, just a month; memory is getting worse on the medicine She takes meclizine for dizziness, not every day, just had a spell yesterday Reviewed recent labs: Vitamin B 1031 Vitamin D 52 TSH 3.14  I asked about old head injuries and she and her husband both laughed out loud; she fell at least 3 times and had 3 concussions, had to have her skin stapled shut; still has pain at the right occipital, fell out of the shower and knocked her head open; ER reviewed; head CT June 2016 -------------------------------------------------- EXAM: CT HEAD WITHOUT CONTRAST  TECHNIQUE: Contiguous axial images were obtained from the base of the skull through the vertex without intravenous contrast.  COMPARISON:  CT head without contrast 01/06/2015  FINDINGS: Mild atrophy and white matter changes are stable. No acute cortical infarct, hemorrhage, or mass lesion is present. The ventricles are proportionate to the degree of atrophy. Basal ganglia are intact.  Minimal mucosal thickening is again noted in the right sphenoid sinus. The paranasal sinuses are otherwise clear. A left mastoidectomy is stable. The right mastoid air cells are clear.  No significant extracranial soft tissue injury is present.  Scleral bands are again noted. The globes and orbits are otherwise within normal limits.  IMPRESSION: 1. No acute intracranial abnormality or significant interval change. 2. Stable atrophy and white matter disease.   Electronically Signed   By: San Morelle M.D.   On: 01/09/2015 15:11 --------------------------------------------------  She says she needs a referral, needs to see a GI specialist, Dr. Rayann Heman left; she has hemorrhoids, colon polyps; internal and external hemorrhoids, having diarrhea; she also gets food stuck when she eats, goes part way down  Relevant past medical, surgical, family and social history reviewed Past Medical History:  Diagnosis Date  . Anxiety and depression   . Cancer (HCC)    breast  . Carpal tunnel syndrome   . Chest pain    SECONDARY TO GERD  . Chronic interstitial cystitis   . Chronic right hip pain   . Depression   . Diabetes mellitus without complication ([place])   . Diverticulosis   . Dyslipidemia   . Essential hypertension, benign   . Fibromyalgia   . Frequent falls   . GERD (gastroesophageal reflux disease)   . Hearing loss of left ear   . History of fractured rib   . History of TIA (transient ischemic attack)   . Irritable bowel   . MS (multiple sclerosis) ([place])   . Osteoarthritis   . Paroxysmal supraventricular tachycardia ([place])   . Peripheral neuropathy (HCC)    MILD  . Protein malnutrition ([place])   . Pure hypercholesterolemia   .  Recurrent UTI   . SOB (shortness of breath)    SECONDARY TO REFLUX  . Solitary pulmonary nodule on lung CT 12/04/2015   3 mm LLL lung nodule June 2016, March 2017  . Tachycardia, paroxysmal (HCC)    Reportedly Paroxysmal Supraventricular Tachycardia, but unable to find documentation confirming this  . Thyroid nodule   . Vertigo   . Vitamin B 12 deficiency    Past Surgical History:  Procedure Laterality Date  . APPENDECTOMY    . BLADDER TACKING     X 3  . BREAST LUMPECTOMY    . BREAST  SURGERY    . CARDIAC CATHETERIZATION  10/2001   Normal coronary arteries with the exception of 20% proximal D1  . CHOLECYSTECTOMY    . COLONOSCOPY WITH PROPOFOL N/A 01/21/2015   Procedure: COLONOSCOPY WITH PROPOFOL;  Surgeon: Josefine Class, MD;  Location[facility] ENDOSCOPY;  Service: Endoscopy;  Laterality: N/A;  . ESOPHAGOGASTRODUODENOSCOPY N/A 01/21/2015   Procedure: ESOPHAGOGASTRODUODENOSCOPY (EGD);  Surgeon: Josefine Class, MD;  Location: [facility] ENDOSCOPY;  Service: Endoscopy;  Laterality: N/A;  . EYE SURGERIES     WITH BUCKLE DETACHMENT OF THE RETINA  . HEMORRHOID SURGERY     WITH RECONSTRUCTION  . MASTECTOMY Right    DX MASTITIS/NO CANCER.Marland KitchenSALINE IMPLANT  . MASTECTOMY    . TONSILLECTOMY    . TOTAL ABDOMINAL HYSTERECTOMY W/ BILATERAL SALPINGOOPHORECTOMY    . TYMPANOPLASTY     Family History  Problem Relation Age of Onset  . Arthritis Sister   . Aneurysm Brother   . Heart disease Daughter   . ALS Mother   . Colon cancer    . Kidney disease Sister   Mother had Leverne Humbles disease Father died of cancer, "all over"; he smoked  Social History  Substance Use Topics  . Smoking status: Never Smoker  . Smokeless tobacco: Never Used  . Alcohol use No   Interim medical history since last visit reviewed. Allergies and medications reviewed  Review of Systems Per HPI unless specifically indicated above     Objective:    BP 136/72   Pulse 83   Temp 97.9 F (36.6 C) (Oral)   Resp 14   Wt 158 lb (71.7 kg)   SpO2 96%   BMI 27.99 kg/m   Wt Readings from Last 3 Encounters:  04/24/16 158 lb (71.7 kg)  04/16/16 158 lb (71.7 kg)  04/10/16 156 lb 12.8 oz (71.1 kg)    Physical Exam  Constitutional: She appears well-developed and well-nourished. No distress.  HENT:  Right Ear: Decreased hearing is noted.  Left Ear: Decreased hearing is noted.  Mouth/Throat: Mucous membranes are normal.  Eyes: EOM are normal. No scleral icterus.  Neck: No thyromegaly present.   Cardiovascular: Normal rate.   Pulmonary/Chest: Effort normal.  Abdominal: She exhibits no distension.  Neurological:  Named 14 animals in one minute; named only 7 cities/towns in one minute (there were all local)  Skin: No pallor.  Psychiatric: She has a normal mood and affect. Her behavior is normal. Judgment and thought content normal. Her mood appears not anxious. Her speech is not delayed and not slurred. She is not slowed and not withdrawn. Cognition and memory are impaired. She does not exhibit a depressed mood. She exhibits abnormal recent memory. She is attentive.   Results for orders placed or performed in visit on 04/16/16  Microscopic Examination  Result Value Ref Range   WBC, UA 0-5 0 - 5 /hpf   RBC, UA None  seen 0 - 2 /hpf   Epithelial Cells (non renal) 0-10 0 - 10 /hpf   Mucus, UA Present (A) Not Estab.   Bacteria, UA Few (A) None seen/Few  Urinalysis, Complete  Result Value Ref Range   Specific Gravity, UA 1.025 1.005 - 1.030   pH, UA 5.0 5.0 - 7.5   Color, UA Yellow Yellow   Appearance Ur Clear Clear   Leukocytes, UA Trace (A) Negative   Protein, UA Negative Negative/Trace   Glucose, UA Negative Negative   Ketones, UA Negative Negative   RBC, UA Trace (A) Negative   Bilirubin, UA Negative Negative   Urobilinogen, Ur 0.2 0.2 - 1.0 mg/dL   Nitrite, UA Negative Negative   Microscopic Examination See below:       Assessment & Plan:   Problem List Items Addressed This Visit      Cardiovascular and Mediastinum   Hemorrhoids    Non-bleeding hemorrhoids, refer to GI      Relevant Orders   Ambulatory referral to Gastroenterology     Digestive   Stricture and stenosis of esophagus    Refer to GI      Relevant Orders   Ambulatory referral to Gastroenterology   Irritable bowel syndrome with constipation and diarrhea    Refer to GI      Relevant Orders   Ambulatory referral to Gastroenterology   Diarrhea, functional    Refer to GI      Relevant  Orders   Ambulatory referral to Gastroenterology     Nervous and Auditory   Dementia    Start Aricept; ask urologist about stopping vesicare; return in 2 months      Relevant Medications   donepezil (ARICEPT) 5 MG tablet    Other Visit Diagnoses   None.     Follow up plan: Return in about 2 months (around 06/24/2016) for memory follow-up.  An after-visit summary was printed and given to the patient at [place].  Please see the patient instructions which may contain other information and recommendations beyond what is mentioned above in the assessment and plan.  Meds ordered this encounter  Medications  . donepezil (ARICEPT) 5 MG tablet    Sig: Take 1 tablet (5 mg total) by mouth at bedtime.    Dispense:  30 tablet    Refill:  0   Orders Placed This Encounter  Procedures  . Ambulatory referral to Gastroenterology

## 2016-04-25 NOTE — Telephone Encounter (Signed)
Left detailed voicemail

## 2016-04-25 NOTE — Telephone Encounter (Signed)
-----   Message from Nori Riis, PA-C sent at 04/24/2016  9:00 PM EDT ----- Regarding: RE: Question about stopping vesicare Her BP is elevated and Myrbetriq can potentially raise her BP even more, but I think we can stop the Vesicare and start Myrbetriq and check her BP in two weeks.  We have samples of the med, so she can pick those up at our office.   ----- Message ----- From: Arnetha Courser, MD Sent: 04/24/2016   4:02 PM To: Nori Riis, PA-C Subject: Question about stopping vesicare               Patient was seen today for memory impairment. What are your thoughts about stopping the Vesicare and either using nothing or Myrbetriq? Thanks!!

## 2016-04-25 NOTE — Telephone Encounter (Signed)
[name], please call patient, let her know [name]'s recommendations: stop Vesicare, pick up samples new med at her office, follow BP closely, see Diane Beach back in 2 weeks Warn her that new med can raise BP; I'd recommend she only take it absolutely needed

## 2016-04-27 ENCOUNTER — Telehealth: Payer: Self-pay

## 2016-04-27 MED ORDER — MEMANTINE HCL 28 X 5 MG & 21 X 10 MG PO TABS: ORAL_TABLET | ORAL | 0 refills | Status: DC

## 2016-04-27 NOTE — Telephone Encounter (Signed)
Patient states she cannot take the new rx Aricept.  We made her really tired, gave her head pain and made her see things(people who were not there).  Pleas advise?

## 2016-04-27 NOTE — Telephone Encounter (Signed)
STOP that medicine I left detailed message Start new medicine Call with any issues

## 2016-04-30 ENCOUNTER — Other Ambulatory Visit: Payer: Self-pay | Admitting: *Deleted

## 2016-05-01 ENCOUNTER — Encounter: Payer: Self-pay | Admitting: *Deleted

## 2016-05-01 NOTE — Patient Outreach (Addendum)
[facility]) Care Management  05/01/2016  TEASA JESS 03-06-1930 [id number]   Phone call to patient to follow up on appointment with the ENT for her hearing test.  Per patient, she was not sure if the referral was made or not.  She is also concerned that the doctor will refer her to a specialist to drain the fluid from her ear.  This Education officer, museum given permission to follow on this on her behalf.  Spoke with Danell who was able to schedule patient for a hearing test on 06/06/16 at 9:45am.  Patient informed and agrees to appointment date and time.  Patient states that she started a new medication for Dementia, however does not know the name of the medication.   Plan:  Home visit scheduled for 05/11/16 at 12:00pm. Patient encouraged to contact her provider with questions regarding her new medication. Health Coach to be informed of new medication patient has started.    [place], [place] Management [place], [place] Care Management [phone]

## 2016-05-02 NOTE — Telephone Encounter (Signed)
This encounter was created in error - please disregard.  This encounter was created in error - please disregard.

## 2016-05-03 ENCOUNTER — Other Ambulatory Visit: Payer: Self-pay | Admitting: *Deleted

## 2016-05-04 ENCOUNTER — Encounter: Payer: Self-pay | Admitting: *Deleted

## 2016-05-04 ENCOUNTER — Telehealth: Payer: Self-pay | Admitting: Family Medicine

## 2016-05-04 NOTE — Telephone Encounter (Signed)
[place] IS NEEDING [employer] [facility] [place]. DIA CODE C50.91 NPI [id number] DR GLENN CHRYSTAL. APT IS OCT 5 @ 1:30

## 2016-05-04 NOTE — Patient Outreach (Signed)
[facility]) Care Management  05/04/2016   [name] Jun 07, 1930 [id number]  Subjective: RN Health Coach telephone call to patient.  [facility] compliance verified. Per patient her blood sugar was 115 non fasting. Patient had went for her diabetic checkup. Per patient she was told by the Dr that she does not need to check her blood sugar every day. Patient A1C is 6.5.Patient is having a hard time hearing.  Patient is working with [place] worker and will be going for a hearing test and possible hearing aid. Patient stated that she has painful hemorrhoids and the Dr gave her a referral to a [place] specialist. Patient has agreed to follow up outreach call.    Objective:   Current Medications:  Current Outpatient Prescriptions  Medication Sig Dispense Refill  . acetaminophen (TYLENOL) 500 MG tablet Take by mouth.    Marland Kitchen aspirin EC 81 MG tablet Take 1 tablet (81 mg total) by mouth daily. 30 tablet 11  . atorvastatin (LIPITOR) 20 MG tablet Take 1 tablet (20 mg total) by mouth at bedtime. 30 tablet 1  . b complex vitamins tablet Take 1 tablet by mouth daily.    . Carboxymethylcell-Hypromellose (GENTEAL OP) Apply 1 drop to eye. Each eye as needed    . Cholecalciferol (VITAMIN D3) 50000 units CAPS Take 5,000 Int'l Units/day by mouth once a week. Reported on 01/25/2016 1 capsule   . Cyanocobalamin (VITAMIN B-12) 1000 MCG SUBL     . dicyclomine (BENTYL) 10 MG capsule Take by mouth.    . erythromycin ophthalmic ointment     . ferrous sulfate 325 (65 FE) MG tablet Take 1 tablet (325 mg total) by mouth daily with breakfast. (Patient taking differently: Take 325 mg by mouth as needed. ) 90 tablet 1  . fluticasone (FLONASE) 50 MCG/ACT nasal spray SPRAY 2 SPRAYS EACH INTO BOTH NOSTRILS ONCE DAILY EACH NIGHT.  5  . hydrocortisone (ANUSOL-HC) 25 MG suppository Place 1 suppository (25 mg total) rectally 2 (two) times daily. 12 suppository 0  . loperamide (IMODIUM A-D) 2 MG tablet Take 2 mg by  mouth 4 (four) times daily as needed for diarrhea or loose stools.    . meclizine (ANTIVERT) 25 MG tablet Take 1 tablet (25 mg total) by mouth every 8 (eight) hours as needed for dizziness. 30 tablet 0  . memantine (NAMENDA TITRATION PAK) tablet pack Titration pack, take as directed 49 tablet 0  . metFORMIN (GLUCOPHAGE) 500 MG tablet Take 1 tablet (500 mg total) by mouth 2 (two) times daily with a meal. (Patient taking differently: Take 500 mg by mouth daily. ) 180 tablet 1  . Misc. Devices (HUGO ROLLING WALKER ELITE) MISC Rolling walker; knee sprain, arthritis; LON 99 months 1 each 0  . neomycin-polymyxin-hydrocortisone (CORTISPORIN) 3.5-10000-1 otic suspension Reported on 01/05/2016    . nystatin cream (MYCOSTATIN) Apply 1 application topically 2 (two) times daily. 30 g 0  . omeprazole (PRILOSEC) 40 MG capsule Take 40 mg by mouth daily.     . ondansetron (ZOFRAN) 4 MG tablet Take 4 mg by mouth every 8 (eight) hours as needed for nausea or vomiting. Reported on 12/22/2015    . Probiotic Product (PROBIOTIC COLON SUPPORT) CAPS Take 1 capsule by mouth daily.     . sertraline (ZOLOFT) 50 MG tablet Take 1 tablet (50 mg total) by mouth daily. 90 tablet 3  . Simethicone (GAS-X EXTRA STRENGTH) 125 MG CAPS Take 1 capsule by mouth as needed. Reported on 11/24/2015  No current facility-administered medications for this visit.     Functional Status:  In your present state of health, do you have any difficulty performing the following activities: 05/04/2016 04/03/2016  Hearing? [name]  Vision? Y Y  Difficulty concentrating or making decisions? N N  Walking or climbing stairs? Y N  Dressing or bathing? N N  Doing errands, shopping? Y Y  Preparing Food and eating ? - -  Using the Toilet? - -  In the past six months, have you accidently leaked urine? - -  Do you have problems with loss of bowel control? - -  Managing your Medications? - -  Managing your Finances? - -  Housekeeping or managing your  Housekeeping? - -  Some recent data might be hidden    Fall/Depression Screening: PHQ 2/9 Scores 05/04/2016 04/10/2016 04/03/2016 04/02/2016 02/29/2016 02/02/2016 12/06/2015  PHQ - 2 Score 1 0 0 1 1 1  0  PHQ- 9 Score - - - - - - -   THN CM Care Plan Problem One   Flowsheet Row Most Recent Value  Care Plan Problem One  knowledge deficit in self management of diabetes  Role Documenting the Problem One  Health [place] for Problem One  Active  THN Long Term Goal (31-90 days)  Patient will continue not have any admissions for diabetes in the next 90 days  THN Long Term Goal Start Date  05/04/16  Interventions for Problem One Long Term Goal  RN reminded patient to keep all apointments with PCP and other caregivers. RN reminded patient the importance of taking all medication as prescribed.  THN CM Short Term Goal #3 (0-30 days)  Patient will not have any falls within the next 30 days  THN CM Short Term Goal #3 Start Date  05/04/16  Interventions for Short Tern Goal #3  RN discussed with patient about fall safety. Patient has follow up appointment with orthopedic on Fri       Assessment: Patient has some short term memory deficit. Patient is decreasing times checking blood sugar Patient will benefit from [place] telephonic outreach for education and support for diabetes self management.   Plan:  RN will follow up with patient in November  RN sent patient a new calender book for appointments and blood sugar checks RN sent patient  Quick reference sheet on treating high and low blood glucose RN sent education material on Your Physical Health, Have Regular checkups, Tests and vaccinations,Foot care, Eye Care, [place] and Hearing care   [place] Management [phone]

## 2016-05-08 DIAGNOSIS — K644 Residual hemorrhoidal skin tags: Secondary | ICD-10-CM | POA: Diagnosis not present

## 2016-05-08 DIAGNOSIS — R198 Other specified symptoms and signs involving the digestive system and abdomen: Secondary | ICD-10-CM | POA: Diagnosis not present

## 2016-05-09 ENCOUNTER — Encounter: Payer: Self-pay | Admitting: Urology

## 2016-05-09 ENCOUNTER — Ambulatory Visit (INDEPENDENT_AMBULATORY_CARE_PROVIDER_SITE_OTHER): Payer: Commercial Managed Care - HMO | Admitting: Family Medicine

## 2016-05-09 ENCOUNTER — Ambulatory Visit
Admission: RE | Admit: 2016-05-09 | Discharge: 2016-05-09 | Disposition: A | Discharge status: Home / Self Care | Payer: Commercial Managed Care - HMO | Source: Ambulatory Visit | Attending: Family Medicine | Admitting: Family Medicine

## 2016-05-09 ENCOUNTER — Encounter: Payer: Self-pay | Admitting: Family Medicine

## 2016-05-09 ENCOUNTER — Ambulatory Visit (INDEPENDENT_AMBULATORY_CARE_PROVIDER_SITE_OTHER): Payer: Commercial Managed Care - HMO | Admitting: Urology

## 2016-05-09 VITALS — BP 159/91 | HR 75 | Ht 62.0 in | Wt 156.4 lb

## 2016-05-09 VITALS — BP 128/74 | HR 77 | Temp 98.1°F | Resp 16 | Wt 156.4 lb

## 2016-05-09 DIAGNOSIS — W19XXXA Unspecified fall, initial encounter: Secondary | ICD-10-CM | POA: Diagnosis not present

## 2016-05-09 DIAGNOSIS — N952 Postmenopausal atrophic vaginitis: Secondary | ICD-10-CM

## 2016-05-09 DIAGNOSIS — R296 Repeated falls: Secondary | ICD-10-CM

## 2016-05-09 DIAGNOSIS — S0990XA Unspecified injury of head, initial encounter: Secondary | ICD-10-CM | POA: Insufficient documentation

## 2016-05-09 DIAGNOSIS — R110 Nausea: Secondary | ICD-10-CM

## 2016-05-09 DIAGNOSIS — N390 Urinary tract infection, site not specified: Secondary | ICD-10-CM

## 2016-05-09 DIAGNOSIS — R51 Headache: Secondary | ICD-10-CM | POA: Diagnosis not present

## 2016-05-09 DIAGNOSIS — N3010 Interstitial cystitis (chronic) without hematuria: Secondary | ICD-10-CM | POA: Diagnosis not present

## 2016-05-09 DIAGNOSIS — R3129 Other microscopic hematuria: Secondary | ICD-10-CM | POA: Diagnosis not present

## 2016-05-09 DIAGNOSIS — H7012 Chronic mastoiditis, left ear: Secondary | ICD-10-CM | POA: Insufficient documentation

## 2016-05-09 DIAGNOSIS — D485 Neoplasm of uncertain behavior of skin: Secondary | ICD-10-CM | POA: Diagnosis not present

## 2016-05-09 DIAGNOSIS — R32 Unspecified urinary incontinence: Secondary | ICD-10-CM | POA: Diagnosis not present

## 2016-05-09 LAB — MICROSCOPIC EXAMINATION

## 2016-05-09 LAB — URINALYSIS, COMPLETE
Bilirubin, UA: NEGATIVE
Glucose, UA: NEGATIVE
Ketones, UA: NEGATIVE
NITRITE UA: POSITIVE — AB
UUROB: 0.2 mg/dL (ref 0.2–1.0)
pH, UA: 5 (ref 5.0–7.5)

## 2016-05-09 MED ORDER — LIDOCAINE HCL 2 % EX GEL
1.0000 "application " | Freq: Once | CUTANEOUS | Status: AC
  Administered 2016-05-09: 1 via URETHRAL

## 2016-05-09 MED ORDER — SODIUM BICARBONATE 8.4 % IV SOLN
11.0000 mL | Freq: Once | INTRAVENOUS | Status: AC
  Administered 2016-05-09: 11 mL

## 2016-05-09 NOTE — Assessment & Plan Note (Signed)
Refer back to PT; fall precautions

## 2016-05-09 NOTE — Progress Notes (Signed)
BP 128/74   Pulse 77   Temp 98.1 F (36.7 C) (Oral)   Resp 16   Wt 156 lb 6.4 oz (70.9 kg)   SpO2 93%   BMI 28.61 kg/m    Subjective:    Patient ID: [name], female    DOB: Jul 25, 1930, 82 y.o.   MRN: [id number]  HPI: [name] is a 82 y.o. female  Chief Complaint  Patient presents with  . Referral    Dermatology    She fell and hit her head yesterday; she was at home and fell backward and hit head on the wall; no LOC; took a while to get up; her other provider upstairs checked her head; she falls frequently; eyes get blurred a lot, going on for a while; left ear is blocked up; she felt nauseated when she woke up this morning, but no vomiting; has headache all over; headache is an 8 out of 10 right now; has hx of concussion, multiple concussions; had to have it closed with staple gun before  She would like to see dermatologist for multiple spots; one on right cheek, two on upper back, left arm, right arm, chest  Depression screen [facility] 2/9 05/14/2016 05/09/2016 05/04/2016 04/10/2016 04/03/2016  Decreased Interest 0 0 0 0 0  Down, Depressed, Hopeless 1 0 1 0 0  PHQ - 2 Score 1 0 1 0 0  Altered sleeping - - - - -  Tired, decreased energy - - - - -  Change in appetite - - - - -  Feeling bad or failure about yourself  - - - - -  Trouble concentrating - - - - -  Moving slowly or fidgety/restless - - - - -  Suicidal thoughts - - - - -  PHQ-9 Score - - - - -  Difficult doing work/chores - - - - -  Some recent data might be hidden   Relevant past medical, surgical, family and social history reviewed Past Medical History:  Diagnosis Date  . Anxiety and depression   . Cancer (HCC)    breast  . Carpal tunnel syndrome   . Chest pain    SECONDARY TO GERD  . Chronic interstitial cystitis   . Chronic right hip pain   . Depression   . Diabetes mellitus without complication ([place])   . Diverticulosis   . Dyslipidemia   . Essential hypertension, benign   . Fibromyalgia     . Frequent falls   . GERD (gastroesophageal reflux disease)   . Hearing loss of left ear   . History of fractured rib   . History of TIA (transient ischemic attack)   . Irritable bowel   . MS (multiple sclerosis) ([place])   . Osteoarthritis   . Paroxysmal supraventricular tachycardia ([place])   . Peripheral neuropathy (HCC)    MILD  . Protein malnutrition ([place])   . Pure hypercholesterolemia   . Recurrent UTI   . SOB (shortness of breath)    SECONDARY TO REFLUX  . Solitary pulmonary nodule on lung CT 12/04/2015   3 mm LLL lung nodule June 2016, March 2017  . Tachycardia, paroxysmal (HCC)    Reportedly Paroxysmal Supraventricular Tachycardia, but unable to find documentation confirming this  . Thyroid nodule   . Vertigo   . Vitamin B 12 deficiency    Past Surgical History:  Procedure Laterality Date  . APPENDECTOMY    . BLADDER TACKING     X 3  . BREAST  LUMPECTOMY    . BREAST SURGERY    . CARDIAC CATHETERIZATION  10/2001   Normal coronary arteries with the exception of 20% proximal D1  . CHOLECYSTECTOMY    . COLONOSCOPY WITH PROPOFOL N/A 01/21/2015   Procedure: COLONOSCOPY WITH PROPOFOL;  Surgeon: Josefine Class, MD;  Location: [facility] ENDOSCOPY;  Service: Endoscopy;  Laterality: N/A;  . ESOPHAGOGASTRODUODENOSCOPY N/A 01/21/2015   Procedure: ESOPHAGOGASTRODUODENOSCOPY (EGD);  Surgeon: Josefine Class, MD;  Location: [facility] ENDOSCOPY;  Service: Endoscopy;  Laterality: N/A;  . EYE SURGERIES     WITH BUCKLE DETACHMENT OF THE RETINA  . HEMORRHOID SURGERY     WITH RECONSTRUCTION  . MASTECTOMY Right    DX MASTITIS/NO CANCER.Marland KitchenSALINE IMPLANT  . MASTECTOMY    . TONSILLECTOMY    . TOTAL ABDOMINAL HYSTERECTOMY W/ BILATERAL SALPINGOOPHORECTOMY    . TYMPANOPLASTY     Social History  Substance Use Topics  . Smoking status: Never Smoker  . Smokeless tobacco: Never Used  . Alcohol use No   Interim medical history since last visit reviewed. Allergies and medications  reviewed  Review of Systems Per HPI unless specifically indicated above     Objective:    BP 128/74   Pulse 77   Temp 98.1 F (36.7 C) (Oral)   Resp 16   Wt 156 lb 6.4 oz (70.9 kg)   SpO2 93%   BMI 28.61 kg/m   Wt Readings from Last 3 Encounters:  05/10/16 156 lb 3.1 oz (70.8 kg)  05/09/16 156 lb 6.4 oz (70.9 kg)  05/09/16 156 lb 6.4 oz (70.9 kg)    Physical Exam  Constitutional: She appears well-developed and well-nourished. No distress.  HENT:  Head: Normocephalic and atraumatic.  Right Ear: External ear normal.  Left Ear: External ear normal.  Nose: Nose normal.  Mouth/Throat: Oropharynx is clear and moist.  Eyes: EOM are normal. No scleral icterus.  Neck: No thyromegaly present.  Cardiovascular: Normal rate.   Pulmonary/Chest: Effort normal.  Abdominal: She exhibits no distension.  Neurological: She displays no tremor. No cranial nerve deficit or sensory deficit. Coordination and gait normal.  Skin: Lesion (forearms, cheek, keratotic lesions) noted. No pallor.  Psychiatric: She has a normal mood and affect. Her behavior is normal. Judgment and thought content normal.   Results for orders placed or performed in visit on 05/09/16  CULTURE, URINE COMPREHENSIVE  Result Value Ref Range   Urine Culture, Comprehensive Final report (A)    Result 1 Klebsiella pneumoniae (A)    ANTIMICROBIAL SUSCEPTIBILITY Comment   Microscopic Examination  Result Value Ref Range   WBC, UA 11-30 (A) 0 - 5 /hpf   RBC, UA 3-10 (A) 0 - 2 /hpf   Epithelial Cells (non renal) >10 (A) 0 - 10 /hpf   Bacteria, UA Moderate (A) None seen/Few  Urinalysis, Complete  Result Value Ref Range   Specific Gravity, UA >1.030 (H) 1.005 - 1.030   pH, UA 5.0 5.0 - 7.5   Color, UA Yellow Yellow   Appearance Ur Cloudy (A) Clear   Leukocytes, UA 1+ (A) Negative   Protein, UA Trace (A) Negative/Trace   Glucose, UA Negative Negative   Ketones, UA Negative Negative   RBC, UA 1+ (A) Negative   Bilirubin, UA  Negative Negative   Urobilinogen, Ur 0.2 0.2 - 1.0 mg/dL   Nitrite, UA Positive (A) Negative   Microscopic Examination See below:       Assessment & Plan:   Problem List Items Addressed This Visit  Musculoskeletal and Integument   Neoplasm of uncertain behavior of skin    Multiple lesions on skin; refer to derm      Relevant Orders   Ambulatory referral to Dermatology     Other   Nausea without vomiting - Primary   Relevant Orders   CT Head Wo Contrast (Completed)   Head injury due to trauma    Get head CT; so important to not fall      Relevant Orders   CT Head Wo Contrast (Completed)   Frequent falls (Chronic)    Refer back to PT; fall precautions      Relevant Orders   Ambulatory referral to Physical Therapy    Other Visit Diagnoses   None.      Follow up plan: No Follow-up on file.  An after-visit summary was printed and given to the patient at [facility].  Please see the patient instructions which may contain other information and recommendations beyond what is mentioned above in the assessment and plan.  No orders of the defined types were placed in this encounter.   Orders Placed This Encounter  Procedures  . CT Head Wo Contrast  . Ambulatory referral to Physical Therapy  . Ambulatory referral to Dermatology

## 2016-05-09 NOTE — Progress Notes (Signed)
8:09 PM   [name] 1930-01-27 [id number]  Referring provider: Arnetha Courser, MD [address] [place], [place] [address]  Chief Complaint  Patient presents with  . Cystitis    Rescue Solution    HPI: Patient is an 82 year old Caucasian female with a history of IC, fall risk, a history of hematuria, incontinence and a history of recurrent UTI's who presents today for rescue installation.     Fall risk She continues to have falling accidents.  She fell against the bathroom wall last evening.  She states she did not lose consciousness.  She states she did not become dizzy, black out or trip.    History of hematuria Patient had a CT abdomen and pelvis wo contrast on 08/03/2015 which noted a stable hyperdense cyst in the left kidney.  A cystoscopy on 01/04/2016 which was negative.  UA today is suspicious for infection.  She denies any gross hematuria.  History of recurrent UTI's Patient has had greater than 4 documented UTI's over the last year.  She has had multiple organisms with variable resistant pattern.   UA contains 6-10 WBCs, moderate bacteria and epithelial cells.  She states she is applying the vaginal cream three nights weekly.  She is experiencing nocturia and leakage of urine.  These are baseline.  UA is suspicious for infection.    Interstitial cystitis Patient presents today for a rescue solution.  She feels she is having improvement with the instillations.    Incontinence Patient currently on Myrbetriq 25 mg daily.  She feels they are providing benefit.  Her BP today is 159/91.  This is baseline BP.     PMH: Past Medical History:  Diagnosis Date  . Anxiety and depression   . Cancer (HCC)    breast  . Carpal tunnel syndrome   . Chest pain    SECONDARY TO GERD  . Chronic interstitial cystitis   . Chronic right hip pain   . Depression   . Diabetes mellitus without complication ([place])   . Diverticulosis   . Dyslipidemia   . Essential  hypertension, benign   . Fibromyalgia   . Frequent falls   . GERD (gastroesophageal reflux disease)   . Hearing loss of left ear   . History of fractured rib   . History of TIA (transient ischemic attack)   . Irritable bowel   . MS (multiple sclerosis) ([place])   . Osteoarthritis   . Paroxysmal supraventricular tachycardia ([place])   . Peripheral neuropathy (HCC)    MILD  . Protein malnutrition ([place])   . Pure hypercholesterolemia   . Recurrent UTI   . SOB (shortness of breath)    SECONDARY TO REFLUX  . Solitary pulmonary nodule on lung CT 12/04/2015   3 mm LLL lung nodule June 2016, March 2017  . Tachycardia, paroxysmal (HCC)    Reportedly Paroxysmal Supraventricular Tachycardia, but unable to find documentation confirming this  . Thyroid nodule   . Vertigo   . Vitamin B 12 deficiency     Surgical History: Past Surgical History:  Procedure Laterality Date  . APPENDECTOMY    . BLADDER TACKING     X 3  . BREAST LUMPECTOMY    . BREAST SURGERY    . CARDIAC CATHETERIZATION  10/2001   Normal coronary arteries with the exception of 20% proximal D1  . CHOLECYSTECTOMY    . COLONOSCOPY WITH PROPOFOL N/A 01/21/2015   Procedure: COLONOSCOPY WITH PROPOFOL;  Surgeon: Josefine Class, MD;  Location: [facility] ENDOSCOPY;  Service: Endoscopy;  Laterality: N/A;  . ESOPHAGOGASTRODUODENOSCOPY N/A 01/21/2015   Procedure: ESOPHAGOGASTRODUODENOSCOPY (EGD);  Surgeon: Josefine Class, MD;  Location: [facility] ENDOSCOPY;  Service: Endoscopy;  Laterality: N/A;  . EYE SURGERIES     WITH BUCKLE DETACHMENT OF THE RETINA  . HEMORRHOID SURGERY     WITH RECONSTRUCTION  . MASTECTOMY Right    DX MASTITIS/NO CANCER.Marland KitchenSALINE IMPLANT  . MASTECTOMY    . TONSILLECTOMY    . TOTAL ABDOMINAL HYSTERECTOMY W/ BILATERAL SALPINGOOPHORECTOMY    . TYMPANOPLASTY      Home Medications:    Medication List       Accurate as of 05/09/16 11:59 PM. Always use your most recent med list.          acetaminophen 500 MG  tablet Commonly known as:  TYLENOL Take by mouth.   aspirin EC 81 MG tablet Take 1 tablet (81 mg total) by mouth daily.   atorvastatin 20 MG tablet Commonly known as:  LIPITOR Take 1 tablet (20 mg total) by mouth at bedtime.   b complex vitamins tablet Take 1 tablet by mouth daily.   dicyclomine 10 MG capsule Commonly known as:  BENTYL Take by mouth.   erythromycin ophthalmic ointment   ESTRACE VAGINAL 0.1 MG/GM vaginal cream Generic drug:  estradiol Place 1 Applicatorful vaginally at bedtime.   ferrous sulfate 325 (65 FE) MG tablet Take 1 tablet (325 mg total) by mouth daily with breakfast.   fluticasone 50 MCG/ACT nasal spray Commonly known as:  FLONASE SPRAY 2 SPRAYS EACH INTO BOTH NOSTRILS ONCE DAILY EACH NIGHT.   GAS-X EXTRA STRENGTH 125 MG Caps Generic drug:  Simethicone Take 1 capsule by mouth as needed. Reported on 11/24/2015   GENTEAL OP Apply 1 drop to eye. Each eye as needed   [place] Rolling walker; knee sprain, arthritis; LON 99 months   hydrocortisone 25 MG suppository Commonly known as:  ANUSOL-HC Place 1 suppository (25 mg total) rectally 2 (two) times daily.   loperamide 2 MG tablet Commonly known as:  IMODIUM A-D Take 2 mg by mouth 4 (four) times daily as needed for diarrhea or loose stools.   meclizine 25 MG tablet Commonly known as:  ANTIVERT Take 1 tablet (25 mg total) by mouth every 8 (eight) hours as needed for dizziness.   memantine tablet pack Commonly known as:  NAMENDA TITRATION PAK Titration pack, take as directed   metFORMIN 500 MG tablet Commonly known as:  GLUCOPHAGE Take 1 tablet (500 mg total) by mouth 2 (two) times daily with a meal.   MYRBETRIQ 25 MG Tb24 tablet Generic drug:  mirabegron ER Take 25 mg by mouth daily.   neomycin-polymyxin-hydrocortisone 3.5-10000-1 otic suspension Commonly known as:  CORTISPORIN Reported on 01/05/2016   nystatin cream Commonly known as:  MYCOSTATIN Apply 1  application topically 2 (two) times daily.   omeprazole 40 MG capsule Commonly known as:  PRILOSEC Take 40 mg by mouth daily.   ondansetron 4 MG tablet Commonly known as:  ZOFRAN Take 4 mg by mouth every 8 (eight) hours as needed for nausea or vomiting. Reported on 12/22/2015   polyethylene glycol powder powder Commonly known as:  GLYCOLAX/MIRALAX Take by mouth.   PROBIOTIC COLON SUPPORT Caps Take 1 capsule by mouth daily.   sertraline 50 MG tablet Commonly known as:  ZOLOFT Take 1 tablet (50 mg total) by mouth daily.   Vitamin B-12 1000 MCG Subl  Vitamin D3 50000 units Caps Take 5,000 Int'l Units/day by mouth once a week. Reported on 01/25/2016       Allergies:  Allergies  Allergen Reactions  . Aricept [Donepezil Hcl] Other (See Comments)    hallucinations  . Biaxin [Clarithromycin] Other (See Comments) and Diarrhea  . Copaxone [Glatiramer Acetate]   . Decadron [Dexamethasone] Nausea And Vomiting and Other (See Comments)    Other reaction(s): Muscle Pain Reaction:  Abdominal pain  . Dexamethasone Sodium Phosphate Other (See Comments)  . Diltiazem Hcl Other (See Comments)    Reaction:  Unknown   . Diltiazem Hcl Other (See Comments)  . Flagyl [Metronidazole] Nausea Only and Diarrhea    Other reaction(s): Vomiting  . Gabapentin Other (See Comments)    "burning all over" Reaction:  Unknown   . Iohexol Swelling and Other (See Comments)     Desc: tongue swelling with "IVP dye" sccording to nurses notes with a lumbar myelo-12/09- asm, Onset Date: [id number]  Pts tongue swells.  Clementeen Hoof [Iodinated Diagnostic Agents] Swelling and Other (See Comments)    Passed out  Pts tongue swells.   . Levothyroxine Nausea Only  . Sulfa Antibiotics Other (See Comments)    Reaction:  Unknown   . Sulfonamide Derivatives Other (See Comments)    Reaction:  Unknown   . Synthroid [Levothyroxine Sodium]   . Copaxone  [Glatiramer] Rash  . Interferon Beta-1a Other (See Comments) and Rash      Reaction:  Unknown     Family History: Family History  Problem Relation Age of Onset  . ALS Mother   . Kidney disease Sister   . Arthritis Sister   . Aneurysm Brother   . Heart disease Daughter   . Colon cancer      Social History:  reports that she has never smoked. She has never used smokeless tobacco. She reports that she does not drink alcohol or use drugs.  ROS: UROLOGY Frequent Urination?: No Hard to postpone urination?: No Burning/pain with urination?: No Get up at night to urinate?: Yes Leakage of urine?: Yes Urine stream starts and stops?: No Trouble starting stream?: No Do you have to strain to urinate?: No Blood in urine?: No Urinary tract infection?: No Sexually transmitted disease?: No Injury to kidneys or bladder?: No Painful intercourse?: No Weak stream?: No Currently pregnant?: No Vaginal bleeding?: No Last menstrual period?: n  Gastrointestinal Nausea?: No Vomiting?: No Indigestion/heartburn?: No Diarrhea?: No Constipation?: No  Constitutional Fever: No Night sweats?: No Weight loss?: No Fatigue?: No  Skin Skin rash/lesions?: No Itching?: No  Eyes Blurred vision?: No Double vision?: No  Ears/Nose/Throat Sore throat?: No Sinus problems?: Yes  Hematologic/Lymphatic Swollen glands?: No Easy bruising?: No  Cardiovascular Leg swelling?: No Chest pain?: No  Respiratory Cough?: No Shortness of breath?: No  Endocrine Excessive thirst?: Yes  Musculoskeletal Back pain?: Yes Joint pain?: Yes  Neurological Headaches?: Yes Dizziness?: Yes  Psychologic Depression?: Yes Anxiety?: Yes  Physical Exam: Blood pressure (!) 159/91, pulse 75, height _0  (1.575 m), weight 156 lb 6.4 oz (70.9 kg). Constitutional: Well nourished. Alert and oriented, No acute distress. HEENT: [place] AT, moist mucus membranes. Trachea midline, no masses. Cardiovascular: No clubbing, cyanosis, or edema. Respiratory: Normal respiratory effort, no  increased work of breathing. GI: Abdomen is soft, non tender, non distended, no abdominal masses.  GU: No CVA tenderness.  No bladder fullness or masses.   Skin: No rashes, bruises or suspicious lesions. Lymph: No cervical or inguinal adenopathy. Neurologic: Grossly  intact, no focal deficits, moving all 4 extremities. Psychiatric: Normal mood and affect.  Laboratory Data: Lab Results  Component Value Date   WBC 4.4 04/03/2016   HGB 12.9 04/03/2016   HCT 39.7 04/03/2016   MCV 92.3 04/03/2016   PLT 186 04/03/2016   Lab Results  Component Value Date   CREATININE 1.10 (H) 04/03/2016   Lab Results  Component Value Date   HGBA1C 6.5 (H) 04/03/2016    Urinalysis: Significant for 11-30 WBC's and 3-10 RBC's.  See EPIC.    Assessment & Plan:    1. Interstitial cystitis  - Patient's rescue installation is performed today.    - She does want to continue the instillations at this time.     - She will return next week for her next treatment.    2. Recurrent UTI's  - No UTI symptoms today.    - Patient has dementia  - UA is suspicious for infection  - We will send for culture, but hold antibiotic at this time - will monitor for worsening dementia  3. Microscopic hematuria  - Work up completed in 2017.  No malignancies discovered.    - UA checked yearly.    - No recent gross hematuria.   - Microscopic hematuria on today's UA, but UA is suspicious for infection  - UA is sent for culture   4. Falling  - Patient fell again last evening  - Does not report any loss of consciousness  - No neurological deficit present on today's exam  - Continue to assist the patient with transfers to table   5. Vaginal atrophy  - patient will continue the vaginal estrogen cream nightly for one more week, then switch to three nights weekly  - patient given vaginal estrogen cream sample at today's visit (Estrace)  6. Incontinence  - patient currently taking Myrbetriq 25 mg daily- BP remains  stable  Return in about 1 week (around 05/16/2016) for rescue solution.  Zara Council, [facility] [address] [place], [place] [address] [phone]

## 2016-05-09 NOTE — Assessment & Plan Note (Signed)
Multiple lesions on skin; refer to derm

## 2016-05-09 NOTE — Progress Notes (Signed)
Bladder Rescue Solution Instillation  Due to interstitial cystitis patient is present today for a Rescue Solution Treatment.  Patient was cleaned and prepped in a sterile fashion with betadine and lidocaine 2% jelly was instilled into the urethra.  A 14 FR catheter was inserted, urine return was noted 59ml, urine was dark yellow in color.  Instilled a solution consisting of 59ml of Sodium Bicarb, 2 ml Lidocaine and 1 ml of Heparin. The catheter was then removed. Patient tolerated well, no complications were noted.   Performed by: Lyndee Hensen CMA  Follow up/ Additional Notes: One Week

## 2016-05-09 NOTE — Patient Instructions (Addendum)
Go to the hospital to have the head CT done and we'll contact you there with the results (don't leave until we know the findings) We'll have you see the dermatologist and the physical therapist  Fall Prevention in the Home  Falls can cause injuries and can affect people from all age groups. There are many simple things that you can do to make your home safe and to help prevent falls. WHAT CAN I DO ON THE OUTSIDE OF MY HOME?  Regularly repair the edges of walkways and driveways and fix any cracks.  Remove high doorway thresholds.  Trim any shrubbery on the main path into your home.  Use bright outdoor lighting.  Clear walkways of debris and clutter, including tools and rocks.  Regularly check that handrails are securely fastened and in good repair. Both sides of any steps should have handrails.  Install guardrails along the edges of any raised decks or porches.  Have leaves, snow, and ice cleared regularly.  Use sand or salt on walkways during winter months.  In the garage, clean up any spills right away, including grease or oil spills. WHAT CAN I DO IN THE BATHROOM?  Use night lights.  Install grab bars by the toilet and in the tub and shower. Do not use towel bars as grab bars.  Use non-skid mats or decals on the floor of the tub or shower.  If you need to sit down while you are in the shower, use a plastic, non-slip stool.Marland Kitchen  Keep the floor dry. Immediately clean up any water that spills on the floor.  Remove soap buildup in the tub or shower on a regular basis.  Attach bath mats securely with double-sided non-slip rug tape.  Remove throw rugs and other tripping hazards from the floor. WHAT CAN I DO IN THE BEDROOM?  Use night lights.  Make sure that a bedside light is easy to reach.  Do not use oversized bedding that drapes onto the floor.  Have a firm chair that has side arms to use for getting dressed.  Remove throw rugs and other tripping hazards from the  floor. WHAT CAN I DO IN THE KITCHEN?   Clean up any spills right away.  Avoid walking on wet floors.  Place frequently used items in easy-to-reach places.  If you need to reach for something above you, use a sturdy step stool that has a grab bar.  Keep electrical cables out of the way.  Do not use floor polish or wax that makes floors slippery. If you have to use wax, make sure that it is non-skid floor wax.  Remove throw rugs and other tripping hazards from the floor. WHAT CAN I DO IN THE STAIRWAYS?  Do not leave any items on the stairs.  Make sure that there are handrails on both sides of the stairs. Fix handrails that are broken or loose. Make sure that handrails are as long as the stairways.  Check any carpeting to make sure that it is firmly attached to the stairs. Fix any carpet that is loose or worn.  Avoid having throw rugs at the top or bottom of stairways, or secure the rugs with carpet tape to prevent them from moving.  Make sure that you have a light switch at the top of the stairs and the bottom of the stairs. If you do not have them, have them installed. WHAT ARE SOME OTHER FALL PREVENTION TIPS?  Wear closed-toe shoes that fit well and support your feet.  Wear shoes that have rubber soles or low heels.  When you use a stepladder, make sure that it is completely opened and that the sides are firmly locked. Have someone hold the ladder while you are using it. Do not climb a closed stepladder.  Add color or contrast paint or tape to grab bars and handrails in your home. Place contrasting color strips on the first and last steps.  Use mobility aids as needed, such as canes, walkers, scooters, and crutches.  Turn on lights if it is dark. Replace any light bulbs that burn out.  Set up furniture so that there are clear paths. Keep the furniture in the same spot.  Fix any uneven floor surfaces.  Choose a carpet design that does not hide the edge of steps of a  stairway.  Be aware of any and all pets.  Review your medicines with your healthcare provider. Some medicines can cause dizziness or changes in blood pressure, which increase your risk of falling. Talk with your health care provider about other ways that you can decrease your risk of falls. This may include working with a physical therapist or trainer to improve your strength, balance, and endurance.   This information is not intended to replace advice given to you by your health care provider. Make sure you discuss any questions you have with your health care provider.   Document Released: 07/13/2002 Document Revised: 12/07/2014 Document Reviewed: 08/27/2014 Elsevier Interactive Patient Education [employer].

## 2016-05-09 NOTE — Assessment & Plan Note (Signed)
Get head CT; so important to not fall

## 2016-05-10 ENCOUNTER — Ambulatory Visit
Admission: RE | Admit: 2016-05-10 | Discharge: 2016-05-10 | Disposition: A | Discharge status: Home / Self Care | Payer: Commercial Managed Care - HMO | Source: Ambulatory Visit | Attending: Radiation Oncology | Admitting: Radiation Oncology

## 2016-05-10 ENCOUNTER — Other Ambulatory Visit: Payer: Self-pay | Admitting: *Deleted

## 2016-05-10 ENCOUNTER — Encounter: Payer: Self-pay | Admitting: Radiation Oncology

## 2016-05-10 VITALS — BP 174/83 | HR 74 | Temp 98.3°F | Wt 156.2 lb

## 2016-05-10 DIAGNOSIS — D0512 Intraductal carcinoma in situ of left breast: Secondary | ICD-10-CM

## 2016-05-10 DIAGNOSIS — Z86000 Personal history of in-situ neoplasm of breast: Secondary | ICD-10-CM | POA: Insufficient documentation

## 2016-05-10 DIAGNOSIS — F0390 Unspecified dementia without behavioral disturbance: Secondary | ICD-10-CM | POA: Insufficient documentation

## 2016-05-10 DIAGNOSIS — Z923 Personal history of irradiation: Secondary | ICD-10-CM | POA: Insufficient documentation

## 2016-05-10 NOTE — Progress Notes (Signed)
Radiation Oncology Follow up Note  Name: [name]   Date:   05/10/2016 MRN:  [id number] DOB: 02-13-1930    This 82 y.o. 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 She is seen today in routine follow-up is doing well she states she has some tenderness in her breasts although has not had a mammogram since March of 2016. We have ordered diagnostic mammograms today. Patient is starting to suffer from early dementia and is being followed for that.   COMPLICATIONS OF TREATMENT: none  FOLLOW UP COMPLIANCE: keeps appointments   PHYSICAL EXAM:  BP (!) 174/83   Pulse 74   Temp 98.3 F (36.8 C)   Wt 156 lb 3.1 oz (70.8 kg)   BMI 28.57 kg/m  Lungs are clear to A&P cardiac examination essentially unremarkable with regular rate and rhythm. No dominant mass or nodularity is noted in either breast in 2 positions examined. Incision is well-healed. No axillary or supraclavicular adenopathy is appreciated. Cosmetic result is excellent. Well-developed well-nourished patient in NAD. HEENT reveals PERLA, EOMI, discs not visualized.  Oral cavity is clear. No oral mucosal lesions are identified. Neck is clear without evidence of cervical or supraclavicular adenopathy. Lungs are clear to A&P. Cardiac examination is essentially unremarkable with regular rate and rhythm without murmur rub or thrill. Abdomen is benign with no organomegaly or masses noted. Motor sensory and DTR levels are equal and symmetric in the upper and lower extremities. Cranial nerves II through XII are grossly intact. Proprioception is intact. No peripheral adenopathy or edema is identified. No motor or sensory levels are noted. Crude visual fields are within  normal range.  RADIOLOGY RESULTS: Bilateral diagnostic mammograms were ordered  PLAN: Present time patient is doing well. I've ordered diagnostic mammograms in her and will review that independently. Otherwise I've asked to see her back in 1 year for follow-up. She is no longer on her anti-estrogen therapy not sure the reasoning will try to track that down. Otherwise I've asked to see her back in 1 year for follow-up. Patient is to call sooner with any concerns.    Armstead Peaks., MD

## 2016-05-11 ENCOUNTER — Ambulatory Visit: Payer: Self-pay | Admitting: *Deleted

## 2016-05-12 LAB — CULTURE, URINE COMPREHENSIVE

## 2016-05-14 ENCOUNTER — Other Ambulatory Visit: Payer: Self-pay | Admitting: *Deleted

## 2016-05-14 ENCOUNTER — Telehealth: Payer: Self-pay

## 2016-05-14 DIAGNOSIS — N390 Urinary tract infection, site not specified: Secondary | ICD-10-CM

## 2016-05-14 MED ORDER — AMOXICILLIN-POT CLAVULANATE 875-125 MG PO TABS: 1.0000 | ORAL_TABLET | Freq: Two times a day (BID) | ORAL | 0 refills | Status: AC

## 2016-05-14 NOTE — Telephone Encounter (Signed)
-----   Message from Nori Riis, PA-C sent at 05/12/2016 12:11 PM EDT ----- Patient has a +UCx.  They need to start Augmentin 875/125 mg, one tablet  twice daily for seven days.

## 2016-05-14 NOTE — Telephone Encounter (Signed)
Spoke with pt in reference to +ucx. Made aware abx was sent to pharmacy. Pt voiced understanding.  

## 2016-05-14 NOTE — Patient Outreach (Signed)
[name]) Care Management  [facility] Social Work  05/14/2016  [name] November 05, 1929 [id number]  Subjective:  Patient is a 82 year old female. Per patient fluid rums from her left ear.  She states that a hearing aid would not improve her hearing in that ear. However she feels that a hearing aid for her right ear would help with her hearing tremendously  Objective:   Encounter Medications:  Outpatient Encounter Prescriptions as of 05/14/2016  Medication Sig Note  . acetaminophen (TYLENOL) 500 MG tablet Take by mouth. 12/29/2015: Received from: [facility]  . amoxicillin-clavulanate (AUGMENTIN) 875-125 MG tablet Take 1 tablet by mouth 2 (two) times daily. 05/14/2016: Per patient , she will pick up the medication today  . aspirin EC 81 MG tablet Take 1 tablet (81 mg total) by mouth daily.   Marland Kitchen atorvastatin (LIPITOR) 20 MG tablet Take 1 tablet (20 mg total) by mouth at bedtime.   Marland Kitchen b complex vitamins tablet Take 1 tablet by mouth daily.   . Carboxymethylcell-Hypromellose (GENTEAL OP) Apply 1 drop to eye. Each eye as needed   . Cholecalciferol (VITAMIN D3) 50000 units CAPS Take 5,000 Int'l Units/day by mouth once a week. Reported on 01/25/2016   . Cyanocobalamin (VITAMIN B-12) 1000 MCG SUBL  03/16/2016: Received from: [facility]  . dicyclomine (BENTYL) 10 MG capsule Take by mouth. 12/29/2015: Received from: [place]  . erythromycin ophthalmic ointment  03/16/2016: Received from: [facility]  . estradiol (ESTRACE VAGINAL) 0.1 MG/GM vaginal cream Place 1 Applicatorful vaginally at bedtime.   . ferrous sulfate 325 (65 FE) MG tablet Take 1 tablet (325 mg total) by mouth daily with breakfast. (Patient taking differently: Take 325 mg by mouth as needed. )   . fluticasone (FLONASE) 50 MCG/ACT nasal spray SPRAY 2 SPRAYS EACH INTO BOTH NOSTRILS ONCE DAILY EACH NIGHT. 10/24/2015: Received from: External Pharmacy  .  hydrocortisone (ANUSOL-HC) 25 MG suppository Place 1 suppository (25 mg total) rectally 2 (two) times daily. (Patient not taking: Reported on 05/09/2016)   . loperamide (IMODIUM A-D) 2 MG tablet Take 2 mg by mouth 4 (four) times daily as needed for diarrhea or loose stools.   . meclizine (ANTIVERT) 25 MG tablet Take 1 tablet (25 mg total) by mouth every 8 (eight) hours as needed for dizziness.   . memantine (NAMENDA TITRATION PAK) tablet pack Titration pack, take as directed   . metFORMIN (GLUCOPHAGE) 500 MG tablet Take 1 tablet (500 mg total) by mouth 2 (two) times daily with a meal. (Patient taking differently: Take 500 mg by mouth daily. )   . mirabegron ER (MYRBETRIQ) 25 MG TB24 tablet Take 25 mg by mouth daily.   . Misc. Devices ([place]) MISC Rolling walker; knee sprain, arthritis; LON 99 months   . neomycin-polymyxin-hydrocortisone (CORTISPORIN) 3.5-10000-1 otic suspension Reported on 01/05/2016 12/22/2015: Received from: External Pharmacy  . nystatin cream (MYCOSTATIN) Apply 1 application topically 2 (two) times daily.   Marland Kitchen omeprazole (PRILOSEC) 40 MG capsule Take 40 mg by mouth daily.  11/30/2015: Received from: External Pharmacy  . ondansetron (ZOFRAN) 4 MG tablet Take 4 mg by mouth every 8 (eight) hours as needed for nausea or vomiting. Reported on 12/22/2015 01/12/2016: PRN  . polyethylene glycol powder (GLYCOLAX/MIRALAX) powder Take by mouth. 05/09/2016: Received from: [facility]: Take 17 g by mouth once daily. Mix in 4-8ounces of fluid prior to taking.  . Probiotic Product (  PROBIOTIC COLON SUPPORT) CAPS Take 1 capsule by mouth daily.  08/15/2015: Takes 1 capsule by mouth daily at night  . sertraline (ZOLOFT) 50 MG tablet Take 1 tablet (50 mg total) by mouth daily.   . Simethicone (GAS-X EXTRA STRENGTH) 125 MG CAPS Take 1 capsule by mouth as needed. Reported on 11/24/2015    No facility-administered encounter medications on file as of 05/14/2016.      Functional Status:  In your present state of health, do you have any difficulty performing the following activities: 05/09/2016 05/04/2016  Hearing? [name]  Vision? Y Y  Difficulty concentrating or making decisions? N N  Walking or climbing stairs? Y Y  Dressing or bathing? N N  Doing errands, shopping? Y Y  Preparing Food and eating ? - -  Using the Toilet? - -  In the past six months, have you accidently leaked urine? - -  Do you have problems with loss of bowel control? - -  Managing your Medications? - -  Managing your Finances? - -  Housekeeping or managing your Housekeeping? - -  Some recent data might be hidden    Fall/Depression Screening:  PHQ 2/9 Scores 05/14/2016 05/09/2016 05/04/2016 04/10/2016 04/03/2016 04/02/2016 02/29/2016  PHQ - 2 Score 1 0 1 0 0 1 1  PHQ- 9 Score - - - - - - -    Assessment:  His social work met with patient and her spouse in her home.  Patient reminded of hearing test for an hearing aid at [facility] 06/06/16 at 9:45am [phone], placed in [facility] calendar.  Patient's spouse will provide transportation. Patient reports having a fall in the bathroom, hit head and needed a cat-scan.  Safety to avoid falls reviewed, emphasized use of assistive devices at all time to avoid falls. Patient states that she was holding on to the bathroom sink at the time of the fall.  Patient states that they are getting ready for their annual apartment inspection.  They cannot afford a cleaning service, they are working on the list of items to be cleaned daily.  Per patient, they cannot afford to pay someone to come in and their daughter has her own medical issues.  Per patient, they will get it done "a little at a time".  Plan: This social worker to follow up with patient after her appointment with the ENT on 06/06/16.   [name] [facility] Care Management [phone]

## 2016-05-17 ENCOUNTER — Encounter: Payer: Self-pay | Admitting: Urology

## 2016-05-17 ENCOUNTER — Ambulatory Visit (INDEPENDENT_AMBULATORY_CARE_PROVIDER_SITE_OTHER): Payer: Commercial Managed Care - HMO | Admitting: Urology

## 2016-05-17 VITALS — BP 163/85 | HR 78 | Ht 62.0 in | Wt 157.7 lb

## 2016-05-17 DIAGNOSIS — R32 Unspecified urinary incontinence: Secondary | ICD-10-CM

## 2016-05-17 DIAGNOSIS — N952 Postmenopausal atrophic vaginitis: Secondary | ICD-10-CM | POA: Diagnosis not present

## 2016-05-17 DIAGNOSIS — R3129 Other microscopic hematuria: Secondary | ICD-10-CM | POA: Diagnosis not present

## 2016-05-17 DIAGNOSIS — R296 Repeated falls: Secondary | ICD-10-CM

## 2016-05-17 DIAGNOSIS — L821 Other seborrheic keratosis: Secondary | ICD-10-CM | POA: Diagnosis not present

## 2016-05-17 DIAGNOSIS — Z1283 Encounter for screening for malignant neoplasm of skin: Secondary | ICD-10-CM | POA: Diagnosis not present

## 2016-05-17 DIAGNOSIS — L578 Other skin changes due to chronic exposure to nonionizing radiation: Secondary | ICD-10-CM | POA: Diagnosis not present

## 2016-05-17 DIAGNOSIS — N390 Urinary tract infection, site not specified: Secondary | ICD-10-CM | POA: Diagnosis not present

## 2016-05-17 DIAGNOSIS — N3010 Interstitial cystitis (chronic) without hematuria: Secondary | ICD-10-CM | POA: Diagnosis not present

## 2016-05-17 DIAGNOSIS — L820 Inflamed seborrheic keratosis: Secondary | ICD-10-CM | POA: Diagnosis not present

## 2016-05-17 DIAGNOSIS — L812 Freckles: Secondary | ICD-10-CM | POA: Diagnosis not present

## 2016-05-17 DIAGNOSIS — L720 Epidermal cyst: Secondary | ICD-10-CM | POA: Diagnosis not present

## 2016-05-17 DIAGNOSIS — D229 Melanocytic nevi, unspecified: Secondary | ICD-10-CM | POA: Diagnosis not present

## 2016-05-17 MED ORDER — SODIUM BICARBONATE 8.4 % IV SOLN
11.0000 mL | Freq: Once | INTRAVENOUS | Status: AC
  Administered 2016-05-17: 11 mL

## 2016-05-17 MED ORDER — LIDOCAINE HCL 2 % EX GEL
1.0000 "application " | Freq: Once | CUTANEOUS | Status: AC
  Administered 2016-05-17: 1 via URETHRAL

## 2016-05-17 NOTE — Progress Notes (Signed)
3:59 PM   [name] 12/07/1929 [id number]  Referring provider: Arnetha Courser, MD [address] [place], [place] [address]  Chief Complaint  Patient presents with  . Follow-up    Interstitial Cystitis    HPI: Patient is an 82 year old Caucasian female with a history of IC, fall risk, a history of hematuria, incontinence and a history of recurrent UTI's who presents today for rescue installation.     Fall risk She continues to have falling accidents.    History of hematuria Patient had a CT abdomen and pelvis wo contrast on 08/03/2015 which noted a stable hyperdense cyst in the left kidney.  A cystoscopy on 01/04/2016 which was negative.  UA today is suspicious for infection.  She denies any gross hematuria.  History of recurrent UTI's Patient has had greater than 4 documented UTI's over the last year.  She has had multiple organisms with variable resistant pattern.   UA contains 6-10 WBCs, moderate bacteria and epithelial cells.  She states she is applying the vaginal cream three nights weekly.  She is experiencing nocturia and leakage of urine.  Patient currently on Augmentin for Klebsiella UTI.    Interstitial cystitis Patient presents today for a rescue solution.  She feels she is having improvement with the instillations.    Incontinence Patient currently on Myrbetriq 25 mg daily.  She feels they are providing benefit.  Her BP today is 163/85.  This is baseline BP.  PVR is 10 mL.     PMH: Past Medical History:  Diagnosis Date  . Anxiety and depression   . Cancer (HCC)    breast  . Carpal tunnel syndrome   . Chest pain    SECONDARY TO GERD  . Chronic interstitial cystitis   . Chronic right hip pain   . Depression   . Diabetes mellitus without complication ([place])   . Diverticulosis   . Dyslipidemia   . Essential hypertension, benign   . Fibromyalgia   . Frequent falls   . GERD (gastroesophageal reflux disease)   . Hearing loss of left ear     . History of fractured rib   . History of TIA (transient ischemic attack)   . Irritable bowel   . MS (multiple sclerosis) ([place])   . Osteoarthritis   . Paroxysmal supraventricular tachycardia ([place])   . Peripheral neuropathy (HCC)    MILD  . Protein malnutrition ([place])   . Pure hypercholesterolemia   . Recurrent UTI   . SOB (shortness of breath)    SECONDARY TO REFLUX  . Solitary pulmonary nodule on lung CT 12/04/2015   3 mm LLL lung nodule June 2016, March 2017  . Tachycardia, paroxysmal (HCC)    Reportedly Paroxysmal Supraventricular Tachycardia, but unable to find documentation confirming this  . Thyroid nodule   . Vertigo   . Vitamin B 12 deficiency     Surgical History: Past Surgical History:  Procedure Laterality Date  . APPENDECTOMY    . BLADDER TACKING     X 3  . BREAST LUMPECTOMY    . BREAST SURGERY    . CARDIAC CATHETERIZATION  10/2001   Normal coronary arteries with the exception of 20% proximal D1  . CHOLECYSTECTOMY    . COLONOSCOPY WITH PROPOFOL N/A 01/21/2015   Procedure: COLONOSCOPY WITH PROPOFOL;  Surgeon: Josefine Class, MD;  Location: [facility] ENDOSCOPY;  Service: Endoscopy;  Laterality: N/A;  . ESOPHAGOGASTRODUODENOSCOPY N/A 01/21/2015   Procedure: ESOPHAGOGASTRODUODENOSCOPY (EGD);  Surgeon: Josefine Class, MD;  Location[facility] ENDOSCOPY;  Service: Endoscopy;  Laterality: N/A;  . EYE SURGERIES     WITH BUCKLE DETACHMENT OF THE RETINA  . HEMORRHOID SURGERY     WITH RECONSTRUCTION  . MASTECTOMY Right    DX MASTITIS/NO CANCER.Marland KitchenSALINE IMPLANT  . MASTECTOMY    . TONSILLECTOMY    . TOTAL ABDOMINAL HYSTERECTOMY W/ BILATERAL SALPINGOOPHORECTOMY    . TYMPANOPLASTY      Home Medications:    Medication List       Accurate as of 05/17/16  3:59 PM. Always use your most recent med list.          acetaminophen 500 MG tablet Commonly known as:  TYLENOL Take by mouth.   amoxicillin-clavulanate 875-125 MG tablet Commonly known as:  AUGMENTIN Take 1  tablet by mouth 2 (two) times daily.   aspirin EC 81 MG tablet Take 1 tablet (81 mg total) by mouth daily.   atorvastatin 20 MG tablet Commonly known as:  LIPITOR Take 1 tablet (20 mg total) by mouth at bedtime.   b complex vitamins tablet Take 1 tablet by mouth daily.   dicyclomine 10 MG capsule Commonly known as:  BENTYL Take by mouth.   erythromycin ophthalmic ointment   ESTRACE VAGINAL 0.1 MG/GM vaginal cream Generic drug:  estradiol Place 1 Applicatorful vaginally at bedtime.   ferrous sulfate 325 (65 FE) MG tablet Take 1 tablet (325 mg total) by mouth daily with breakfast.   fluticasone 50 MCG/ACT nasal spray Commonly known as:  FLONASE SPRAY 2 SPRAYS EACH INTO BOTH NOSTRILS ONCE DAILY EACH NIGHT.   GAS-X EXTRA STRENGTH 125 MG Caps Generic drug:  Simethicone Take 1 capsule by mouth as needed. Reported on 11/24/2015   GENTEAL OP Apply 1 drop to eye. Each eye as needed   [place] Rolling walker; knee sprain, arthritis; LON 99 months   hydrocortisone 25 MG suppository Commonly known as:  ANUSOL-HC Place 1 suppository (25 mg total) rectally 2 (two) times daily.   loperamide 2 MG tablet Commonly known as:  IMODIUM A-D Take 2 mg by mouth 4 (four) times daily as needed for diarrhea or loose stools.   meclizine 25 MG tablet Commonly known as:  ANTIVERT Take 1 tablet (25 mg total) by mouth every 8 (eight) hours as needed for dizziness.   memantine tablet pack Commonly known as:  NAMENDA TITRATION PAK Titration pack, take as directed   metFORMIN 500 MG tablet Commonly known as:  GLUCOPHAGE Take 1 tablet (500 mg total) by mouth 2 (two) times daily with a meal.   MYRBETRIQ 25 MG Tb24 tablet Generic drug:  mirabegron ER Take 25 mg by mouth daily.   neomycin-polymyxin-hydrocortisone 3.5-10000-1 otic suspension Commonly known as:  CORTISPORIN Reported on 01/05/2016   nystatin cream Commonly known as:  MYCOSTATIN Apply 1 application  topically 2 (two) times daily.   omeprazole 40 MG capsule Commonly known as:  PRILOSEC Take 40 mg by mouth daily.   ondansetron 4 MG tablet Commonly known as:  ZOFRAN Take 4 mg by mouth every 8 (eight) hours as needed for nausea or vomiting. Reported on 12/22/2015   polyethylene glycol powder powder Commonly known as:  GLYCOLAX/MIRALAX Take by mouth.   PROBIOTIC COLON SUPPORT Caps Take 1 capsule by mouth daily.   sertraline 50 MG tablet Commonly known as:  ZOLOFT Take 1 tablet (50 mg total) by mouth daily.   Vitamin B-12 1000 MCG Subl   Vitamin D3 50000  units Caps Take 5,000 Int'l Units/day by mouth once a week. Reported on 01/25/2016       Allergies:  Allergies  Allergen Reactions  . Aricept [Donepezil Hcl] Other (See Comments)    hallucinations  . Biaxin [Clarithromycin] Other (See Comments) and Diarrhea  . Copaxone [Glatiramer Acetate]   . Decadron [Dexamethasone] Nausea And Vomiting and Other (See Comments)    Other reaction(s): Muscle Pain Reaction:  Abdominal pain  . Dexamethasone Sodium Phosphate Other (See Comments)  . Diltiazem Hcl Other (See Comments)    Reaction:  Unknown   . Diltiazem Hcl Other (See Comments)  . Flagyl [Metronidazole] Nausea Only and Diarrhea    Other reaction(s): Vomiting  . Gabapentin Other (See Comments)    "burning all over" Reaction:  Unknown   . Iohexol Swelling and Other (See Comments)     Desc: tongue swelling with "IVP dye" sccording to nurses notes with a lumbar myelo-12/09- asm, Onset Date: [id number]  Pts tongue swells.  Clementeen Hoof [Iodinated Diagnostic Agents] Swelling and Other (See Comments)    Passed out  Pts tongue swells.   . Levothyroxine Nausea Only  . Sulfa Antibiotics Other (See Comments)    Reaction:  Unknown   . Sulfonamide Derivatives Other (See Comments)    Reaction:  Unknown   . Synthroid [Levothyroxine Sodium]   . Copaxone  [Glatiramer] Rash  . Interferon Beta-1a Other (See Comments) and Rash     Reaction:  Unknown     Family History: Family History  Problem Relation Age of Onset  . ALS Mother   . Kidney disease Sister   . Arthritis Sister   . Aneurysm Brother   . Heart disease Daughter   . Colon cancer      Social History:  reports that she has never smoked. She has never used smokeless tobacco. She reports that she does not drink alcohol or use drugs.  ROS: UROLOGY Frequent Urination?: No Hard to postpone urination?: Yes Burning/pain with urination?: No Get up at night to urinate?: Yes Leakage of urine?: No Urine stream starts and stops?: No Trouble starting stream?: No Do you have to strain to urinate?: No Blood in urine?: No Urinary tract infection?: Yes Sexually transmitted disease?: No Injury to kidneys or bladder?: No Painful intercourse?: No Weak stream?: No Currently pregnant?: No Vaginal bleeding?: No Last menstrual period?: n  Gastrointestinal Nausea?: No Vomiting?: No Indigestion/heartburn?: No Diarrhea?: Yes Constipation?: Yes  Constitutional Fever: No Night sweats?: Yes Weight loss?: No Fatigue?: Yes  Skin Skin rash/lesions?: No Itching?: No  Eyes Blurred vision?: Yes Double vision?: No  Ears/Nose/Throat Sore throat?: No Sinus problems?: Yes  Hematologic/Lymphatic Swollen glands?: No Easy bruising?: No  Cardiovascular Leg swelling?: Yes Chest pain?: No  Respiratory Cough?: No Shortness of breath?: No  Endocrine Excessive thirst?: No  Musculoskeletal Back pain?: Yes Joint pain?: Yes  Neurological Headaches?: Yes Dizziness?: Yes  Psychologic Depression?: Yes Anxiety?: Yes  Physical Exam: Blood pressure (!) 163/85, pulse 78, height _0  (1.575 m), weight 157 lb 11.2 oz (71.5 kg). Constitutional: Well nourished. Alert and oriented, No acute distress. HEENT: [place] AT, moist mucus membranes. Trachea midline, no masses. Cardiovascular: No clubbing, cyanosis, or edema. Respiratory: Normal respiratory effort, no  increased work of breathing. GI: Abdomen is soft, non tender, non distended, no abdominal masses.  GU: No CVA tenderness.  No bladder fullness or masses.   Skin: No rashes, bruises or suspicious lesions. Lymph: No cervical or inguinal adenopathy. Neurologic: Grossly intact, no focal deficits,  moving all 4 extremities. Psychiatric: Normal mood and affect.  Laboratory Data: Lab Results  Component Value Date   WBC 4.4 04/03/2016   HGB 12.9 04/03/2016   HCT 39.7 04/03/2016   MCV 92.3 04/03/2016   PLT 186 04/03/2016   Lab Results  Component Value Date   CREATININE 1.10 (H) 04/03/2016   Lab Results  Component Value Date   HGBA1C 6.5 (H) 04/03/2016    Assessment & Plan:    1. Interstitial cystitis  - Patient's rescue installation is performed today.    - She does want to continue the instillations at this time.     - She will return next week for her next treatment.    2. Recurrent UTI's  - No UTI symptoms today.    - Patient has dementia  - Urine culture was positive for Klebsiella- currently on Augmentin  3. Microscopic hematuria  - Work up completed in 2017.  No malignancies discovered.    - UA checked yearly.    - No recent gross hematuria.   4. Falling  - Continue to assist the patient with transfers to table   5. Vaginal atrophy  - patient will continue the vaginal estrogen cream nightly for one more week, then switch to three nights weekly  - patient given vaginal estrogen cream sample at today's visit (Estrace)  6. Incontinence  - patient currently taking Myrbetriq 25 mg daily- BP remains stable  Return in about 1 week (around 05/24/2016) for UA and rescue solution.  Zara Council, [facility] [address], [place], [place] [address] [phone]

## 2016-05-17 NOTE — Progress Notes (Signed)
Bladder Rescue Solution Instillation  Due to interstitial cystitis patient is present today for a Rescue Solution Treatment.  Patient was cleaned and prepped in a sterile fashion with betadine and lidocaine 2% jelly was instilled into the urethra.  A 14 FR catheter was inserted, urine return was noted 45ml, urine was yellow in color.  Instilled a solution consisting of 46ml of Sodium Bicarb, 2 ml Lidocaine and 1 ml of Heparin. The catheter was then removed. Patient tolerated well, no complications were noted.   Performed by: Lyndee Hensen CMA  Follow up/ Additional Notes: One week

## 2016-05-22 DIAGNOSIS — M1711 Unilateral primary osteoarthritis, right knee: Secondary | ICD-10-CM | POA: Diagnosis not present

## 2016-05-22 DIAGNOSIS — M65321 Trigger finger, right index finger: Secondary | ICD-10-CM | POA: Diagnosis not present

## 2016-05-22 DIAGNOSIS — M159 Polyosteoarthritis, unspecified: Secondary | ICD-10-CM | POA: Diagnosis not present

## 2016-05-23 ENCOUNTER — Other Ambulatory Visit: Payer: Self-pay | Admitting: Family Medicine

## 2016-05-23 NOTE — Telephone Encounter (Signed)
aricept request received; DENIED; she reacted to this and it was stopped

## 2016-05-24 ENCOUNTER — Encounter: Payer: Self-pay | Admitting: Urology

## 2016-05-24 ENCOUNTER — Ambulatory Visit (INDEPENDENT_AMBULATORY_CARE_PROVIDER_SITE_OTHER): Payer: Commercial Managed Care - HMO | Admitting: Urology

## 2016-05-24 VITALS — BP 140/100 | HR 76 | Ht 63.0 in | Wt 156.4 lb

## 2016-05-24 DIAGNOSIS — N952 Postmenopausal atrophic vaginitis: Secondary | ICD-10-CM

## 2016-05-24 DIAGNOSIS — R3129 Other microscopic hematuria: Secondary | ICD-10-CM

## 2016-05-24 DIAGNOSIS — N390 Urinary tract infection, site not specified: Secondary | ICD-10-CM

## 2016-05-24 DIAGNOSIS — R296 Repeated falls: Secondary | ICD-10-CM

## 2016-05-24 DIAGNOSIS — R32 Unspecified urinary incontinence: Secondary | ICD-10-CM

## 2016-05-24 DIAGNOSIS — N3010 Interstitial cystitis (chronic) without hematuria: Secondary | ICD-10-CM

## 2016-05-24 LAB — URINALYSIS, COMPLETE
BILIRUBIN UA: NEGATIVE
GLUCOSE, UA: NEGATIVE
Nitrite, UA: NEGATIVE
PROTEIN UA: NEGATIVE
RBC UA: NEGATIVE
Specific Gravity, UA: >1.03 — ABNORMAL HIGH (ref 1.005–1.030)
UUROB: 0.2 mg/dL (ref 0.2–1.0)
pH, UA: 5 (ref 5.0–7.5)

## 2016-05-24 LAB — MICROSCOPIC EXAMINATION: Epithelial Cells (non renal): >10 /hpf — AB (ref 0–10)

## 2016-05-24 MED ORDER — SODIUM BICARBONATE 8.4 % IV SOLN
11.0000 mL | Freq: Once | INTRAVENOUS | Status: AC
  Administered 2016-05-24: 11 mL

## 2016-05-24 NOTE — Progress Notes (Signed)
9:02 PM   [name] [birth year] [id number]  Referring provider: Arnetha Courser, MD [address] [place], [place] [address]  Chief Complaint  Patient presents with  . Follow-up    interstitial cystitis    HPI: Patient is an 82 year old Caucasian female with a history of IC, fall risk, a history of hematuria, incontinence and a history of recurrent UTI's who presents today for rescue installation.     Fall risk She continues to have falling accidents.    History of hematuria Patient had a CT abdomen and pelvis wo contrast on 08/03/2015 which noted a stable hyperdense cyst in the left kidney.  A cystoscopy on 01/04/2016 which was negative.  UA today is negative for hematuria.  She denies any gross hematuria.  History of recurrent UTI's Patient has had greater than 4 documented UTI's over the last year.  She has had multiple organisms with variable resistant pattern.   UA contains 6-10 WBCs, moderate bacteria and epithelial cells.  She states she is applying the vaginal cream three nights weekly.  She is experiencing nocturia and leakage of urine.  Patient has completed her Augmentin 875/125  Interstitial cystitis Patient presents today for a rescue solution.  She feels she is having improvement with the instillations.  She gets relief of her bladder pain for a few days after the solution is given.    Incontinence Patient currently on Myrbetriq 25 mg daily.  She feels they are providing benefit.  Her BP today is 140/100.  This is baseline BP.    PVR is 10 mL.     PMH: Past Medical History:  Diagnosis Date  . Anxiety and depression   . Cancer (HCC)    breast  . Carpal tunnel syndrome   . Chest pain    SECONDARY TO GERD  . Chronic interstitial cystitis   . Chronic right hip pain   . Depression   . Diabetes mellitus without complication ([place])   . Diverticulosis   . Dyslipidemia   . Essential hypertension, benign   . Fibromyalgia   . Frequent falls     . GERD (gastroesophageal reflux disease)   . Hearing loss of left ear   . History of fractured rib   . History of TIA (transient ischemic attack)   . Irritable bowel   . MS (multiple sclerosis) ([place])   . Osteoarthritis   . Paroxysmal supraventricular tachycardia ([place])   . Peripheral neuropathy (HCC)    MILD  . Protein malnutrition ([place])   . Pure hypercholesterolemia   . Recurrent UTI   . SOB (shortness of breath)    SECONDARY TO REFLUX  . Solitary pulmonary nodule on lung CT 12/04/2015   3 mm LLL lung nodule June 2016, March 2017  . Tachycardia, paroxysmal (HCC)    Reportedly Paroxysmal Supraventricular Tachycardia, but unable to find documentation confirming this  . Thyroid nodule   . Vertigo   . Vitamin B 12 deficiency     Surgical History: Past Surgical History:  Procedure Laterality Date  . APPENDECTOMY    . BLADDER TACKING     X 3  . BREAST LUMPECTOMY    . BREAST SURGERY    . CARDIAC CATHETERIZATION  10/2001   Normal coronary arteries with the exception of 20% proximal D1  . CHOLECYSTECTOMY    . COLONOSCOPY WITH PROPOFOL N/A 01/21/2015   Procedure: COLONOSCOPY WITH PROPOFOL;  Surgeon: Josefine Class, MD;  Location: [facility] ENDOSCOPY;  Service: Endoscopy;  Laterality: N/A;  . ESOPHAGOGASTRODUODENOSCOPY N/A 01/21/2015   Procedure: ESOPHAGOGASTRODUODENOSCOPY (EGD);  Surgeon: Josefine Class, MD;  Location: [facility] ENDOSCOPY;  Service: Endoscopy;  Laterality: N/A;  . EYE SURGERIES     WITH BUCKLE DETACHMENT OF THE RETINA  . HEMORRHOID SURGERY     WITH RECONSTRUCTION  . MASTECTOMY Right    DX MASTITIS/NO CANCER.Marland KitchenSALINE IMPLANT  . MASTECTOMY    . TONSILLECTOMY    . TOTAL ABDOMINAL HYSTERECTOMY W/ BILATERAL SALPINGOOPHORECTOMY    . TYMPANOPLASTY      Home Medications:    Medication List       Accurate as of 05/24/16 11:59 PM. Always use your most recent med list.          acetaminophen 500 MG tablet Commonly known as:  TYLENOL Take by mouth.   aspirin  EC 81 MG tablet Take 1 tablet (81 mg total) by mouth daily.   atorvastatin 20 MG tablet Commonly known as:  LIPITOR Take 1 tablet (20 mg total) by mouth at bedtime.   b complex vitamins tablet Take 1 tablet by mouth daily.   dicyclomine 10 MG capsule Commonly known as:  BENTYL Take by mouth.   erythromycin ophthalmic ointment   ESTRACE VAGINAL 0.1 MG/GM vaginal cream Generic drug:  estradiol Place 1 Applicatorful vaginally at bedtime.   ferrous sulfate 325 (65 FE) MG tablet Take 1 tablet (325 mg total) by mouth daily with breakfast.   fluticasone 50 MCG/ACT nasal spray Commonly known as:  FLONASE SPRAY 2 SPRAYS EACH INTO BOTH NOSTRILS ONCE DAILY EACH NIGHT.   GAS-X EXTRA STRENGTH 125 MG Caps Generic drug:  Simethicone Take 1 capsule by mouth as needed. Reported on 11/24/2015   GENTEAL OP Apply 1 drop to eye. Each eye as needed   [place] Rolling walker; knee sprain, arthritis; LON 99 months   hydrocortisone 25 MG suppository Commonly known as:  ANUSOL-HC Place 1 suppository (25 mg total) rectally 2 (two) times daily.   loperamide 2 MG tablet Commonly known as:  IMODIUM A-D Take 2 mg by mouth 4 (four) times daily as needed for diarrhea or loose stools.   meclizine 25 MG tablet Commonly known as:  ANTIVERT Take 1 tablet (25 mg total) by mouth every 8 (eight) hours as needed for dizziness.   memantine tablet pack Commonly known as:  NAMENDA TITRATION PAK Titration pack, take as directed   metFORMIN 500 MG tablet Commonly known as:  GLUCOPHAGE Take 1 tablet (500 mg total) by mouth 2 (two) times daily with a meal.   MYRBETRIQ 25 MG Tb24 tablet Generic drug:  mirabegron ER Take 25 mg by mouth daily.   neomycin-polymyxin-hydrocortisone 3.5-10000-1 otic suspension Commonly known as:  CORTISPORIN Reported on 01/05/2016   nystatin cream Commonly known as:  MYCOSTATIN Apply 1 application topically 2 (two) times daily.   omeprazole 40 MG  capsule Commonly known as:  PRILOSEC Take 40 mg by mouth daily.   ondansetron 4 MG tablet Commonly known as:  ZOFRAN Take 4 mg by mouth every 8 (eight) hours as needed for nausea or vomiting. Reported on 12/22/2015   polyethylene glycol powder powder Commonly known as:  GLYCOLAX/MIRALAX Take by mouth.   PROBIOTIC COLON SUPPORT Caps Take 1 capsule by mouth daily.   sertraline 50 MG tablet Commonly known as:  ZOLOFT Take 1 tablet (50 mg total) by mouth daily.   Vitamin B-12 1000 MCG Subl   Vitamin D3 50000 units Caps Take 5,000 Int'l  Units/day by mouth once a week. Reported on 01/25/2016       Allergies:  Allergies  Allergen Reactions  . Aricept [Donepezil Hcl] Other (See Comments)    hallucinations  . Biaxin [Clarithromycin] Other (See Comments) and Diarrhea  . Copaxone [Glatiramer Acetate]   . Decadron [Dexamethasone] Nausea And Vomiting and Other (See Comments)    Other reaction(s): Muscle Pain Reaction:  Abdominal pain  . Dexamethasone Sodium Phosphate Other (See Comments)  . Diltiazem Hcl Other (See Comments)    Reaction:  Unknown   . Diltiazem Hcl Other (See Comments)  . Flagyl [Metronidazole] Nausea Only and Diarrhea    Other reaction(s): Vomiting  . Gabapentin Other (See Comments)    "burning all over" Reaction:  Unknown   . Iohexol Swelling and Other (See Comments)     Desc: tongue swelling with "IVP dye" sccording to nurses notes with a lumbar myelo-12/09- asm, Onset Date: [id number]  Pts tongue swells.  Clementeen Hoof [Iodinated Diagnostic Agents] Swelling and Other (See Comments)    Passed out  Pts tongue swells.   . Levothyroxine Nausea Only  . Sulfa Antibiotics Other (See Comments)    Reaction:  Unknown   . Sulfonamide Derivatives Other (See Comments)    Reaction:  Unknown   . Synthroid [Levothyroxine Sodium]   . Copaxone  [Glatiramer] Rash  . Interferon Beta-1a Other (See Comments) and Rash    Reaction:  Unknown     Family History: Family History    Problem Relation Age of Onset  . ALS Mother   . Kidney disease Sister   . Arthritis Sister   . Aneurysm Brother   . Heart disease Daughter   . Colon cancer      Social History:  reports that she has never smoked. She has never used smokeless tobacco. She reports that she does not drink alcohol or use drugs.  ROS: UROLOGY Frequent Urination?: No Hard to postpone urination?: No Burning/pain with urination?: Yes Get up at night to urinate?: Yes Leakage of urine?: Yes Urine stream starts and stops?: No Trouble starting stream?: No Do you have to strain to urinate?: No Blood in urine?: No Urinary tract infection?: No Sexually transmitted disease?: No Injury to kidneys or bladder?: No Painful intercourse?: No Weak stream?: No Currently pregnant?: No Vaginal bleeding?: No Last menstrual period?: No  Gastrointestinal Nausea?: No Vomiting?: No Indigestion/heartburn?: Yes Diarrhea?: Yes Constipation?: Yes  Constitutional Fever: No Night sweats?: Yes Weight loss?: No Fatigue?: Yes  Skin Skin rash/lesions?: No Itching?: No  Eyes Blurred vision?: Yes Double vision?: No  Ears/Nose/Throat Sore throat?: No Sinus problems?: Yes  Hematologic/Lymphatic Swollen glands?: No Easy bruising?: No  Cardiovascular Leg swelling?: No Chest pain?: No  Respiratory Cough?: No Shortness of breath?: No  Endocrine Excessive thirst?: No  Musculoskeletal Back pain?: Yes Joint pain?: Yes  Neurological Headaches?: Yes Dizziness?: Yes  Psychologic Depression?: Yes Anxiety?: Yes  Physical Exam: Blood pressure (!) 140/100, pulse 76, height 5' 3" (1.6 m), weight 156 lb 6.4 oz (70.9 kg). Constitutional: Well nourished. Alert and oriented, No acute distress. HEENT: [place] AT, moist mucus membranes. Trachea midline, no masses. Cardiovascular: No clubbing, cyanosis, or edema. Respiratory: Normal respiratory effort, no increased work of breathing. GI: Abdomen is soft, non  tender, non distended, no abdominal masses.  GU: No CVA tenderness.  No bladder fullness or masses.   Skin: No rashes, bruises or suspicious lesions. Lymph: No cervical or inguinal adenopathy. Neurologic: Grossly intact, no focal deficits, moving all 4 extremities.  Psychiatric: Normal mood and affect.  Laboratory Data: Lab Results  Component Value Date   WBC 4.4 04/03/2016   HGB 12.9 04/03/2016   HCT 39.7 04/03/2016   MCV 92.3 04/03/2016   PLT 186 04/03/2016   Lab Results  Component Value Date   CREATININE 1.10 (H) 04/03/2016   Lab Results  Component Value Date   HGBA1C 6.5 (H) 04/03/2016    Assessment & Plan:    1. Interstitial cystitis  - Patient's rescue installation is performed today.    - She does want to continue the instillations at this time.     - She will return next week for her next treatment.    2. Recurrent UTI's  - No UTI symptoms today.    - UA at baseline   3. Microscopic hematuria  - Work up completed in 2017.  No malignancies discovered.    - UA checked yearly.    - No recent gross hematuria.   4. Falling  - Continue to assist the patient with transfers to table   5. Vaginal atrophy  - patient will continue the vaginal estrogen cream nightly for one more week, then switch to three nights weekly  - patient given vaginal estrogen cream sample at today's visit (Estrace)  6. Incontinence  - patient currently taking Myrbetriq 25 mg daily- BP remains stable  Return in about 1 week (around 05/31/2016) for rescue solution.  Zara Council, [facility] [address], [place], [place] [address] [phone]

## 2016-05-24 NOTE — Progress Notes (Signed)
Bladder Rescue Solution Instillation  Due to IC patient is present today for a Rescue Solution Treatment.  Patient was cleaned and prepped in a sterile fashion with betadine and lidocaine 2% jelly was instilled into the urethra.  A 14 FR catheter was inserted, urine return was noted 49ml, urine was yellow in color.  Instilled a solution consisting of 14ml of Sodium Bicarb, 2 ml Lidocaine and 1 ml of Heparin. The catheter was then removed. Patient tolerated well, no complications were noted.   Performed by: [place]  Follow up/ Additional Notes:

## 2016-05-26 ENCOUNTER — Other Ambulatory Visit: Payer: Self-pay | Admitting: Family Medicine

## 2016-05-29 ENCOUNTER — Encounter: Payer: Self-pay | Admitting: Urology

## 2016-05-29 ENCOUNTER — Ambulatory Visit: Payer: Commercial Managed Care - HMO | Admitting: Urology

## 2016-06-04 ENCOUNTER — Other Ambulatory Visit: Payer: Self-pay | Admitting: Radiation Oncology

## 2016-06-04 ENCOUNTER — Ambulatory Visit
Admission: RE | Admit: 2016-06-04 | Discharge: 2016-06-04 | Disposition: A | Discharge status: Home / Self Care | Payer: Commercial Managed Care - HMO | Source: Ambulatory Visit | Attending: Radiation Oncology | Admitting: Radiation Oncology

## 2016-06-04 DIAGNOSIS — D0512 Intraductal carcinoma in situ of left breast: Secondary | ICD-10-CM

## 2016-06-04 DIAGNOSIS — Z853 Personal history of malignant neoplasm of breast: Secondary | ICD-10-CM | POA: Insufficient documentation

## 2016-06-04 DIAGNOSIS — L723 Sebaceous cyst: Secondary | ICD-10-CM | POA: Diagnosis not present

## 2016-06-04 DIAGNOSIS — R928 Other abnormal and inconclusive findings on diagnostic imaging of breast: Secondary | ICD-10-CM | POA: Diagnosis not present

## 2016-06-04 HISTORY — DX: Personal history of irradiation: Z92.3

## 2016-06-04 HISTORY — DX: Unspecified malignant neoplasm of skin, unspecified: C44.90

## 2016-06-04 HISTORY — DX: Malignant neoplasm of unspecified site of unspecified female breast: C50.919

## 2016-06-05 ENCOUNTER — Other Ambulatory Visit: Payer: Self-pay | Admitting: *Deleted

## 2016-06-05 NOTE — Patient Outreach (Signed)
[facility]) Care Management  06/05/2016  [name] May 26, 1930 [id number]   Phone call to patient and her spouse to remind them of patient's hearing test on 06/06/16 at 9:45am at [facility].  Per patient and spouse, both plan to attend.    [name] [facility] Care Management [phone]

## 2016-06-06 IMAGING — US US BREAST*L* LIMITED INC AXILLA
1 series · 6 of 6 positions shown · non-contrast
Comparison: Priors

CLINICAL DATA: Patient with left breast lumpectomy 7486 for left
breast ductal carcinoma in situ.

EXAM:
DIGITAL DIAGNOSTIC BILATERAL MAMMOGRAM WITH 3D TOMOSYNTHESIS WITH
CAD
ULTRASOUND LEFT BREAST

[Series 1: us breast*left* limited inc axilla · 0.08mm/px · 6 of 6 slices shown]
[im 1/6]
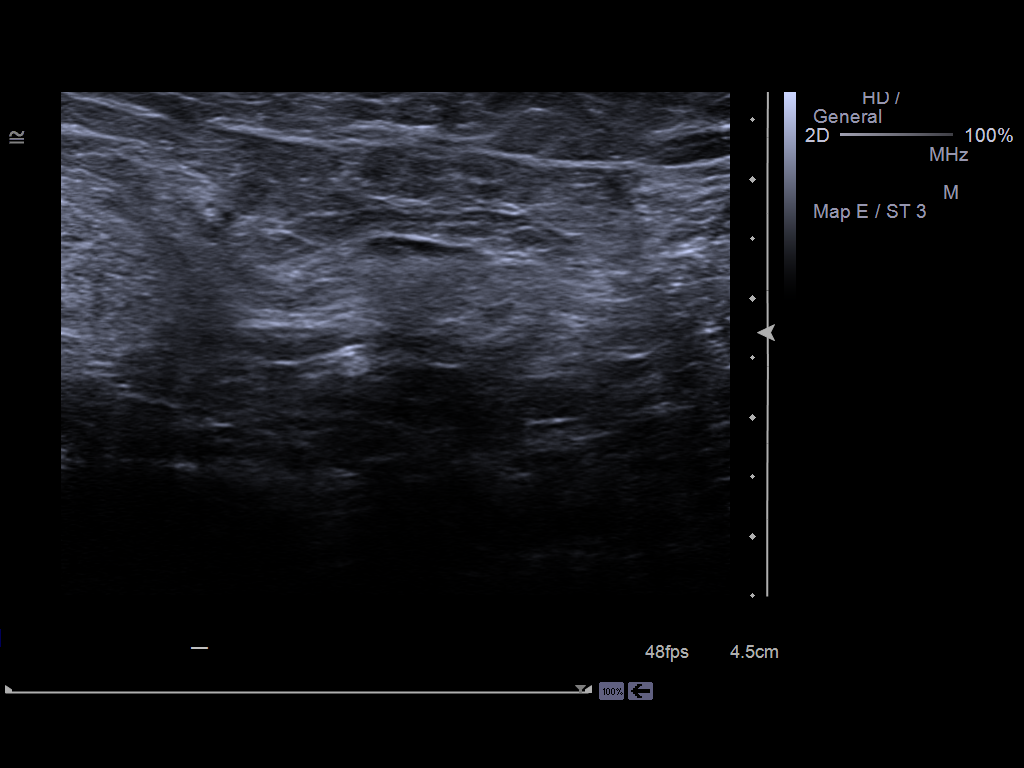
[im 2/6]
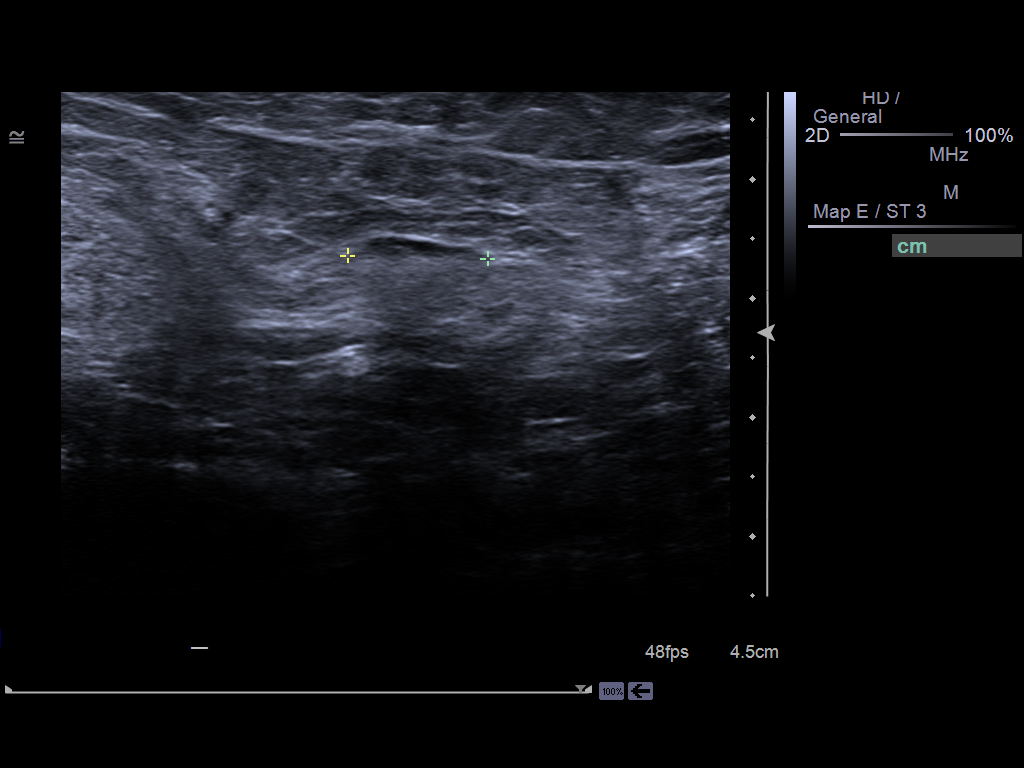
[im 3/6]
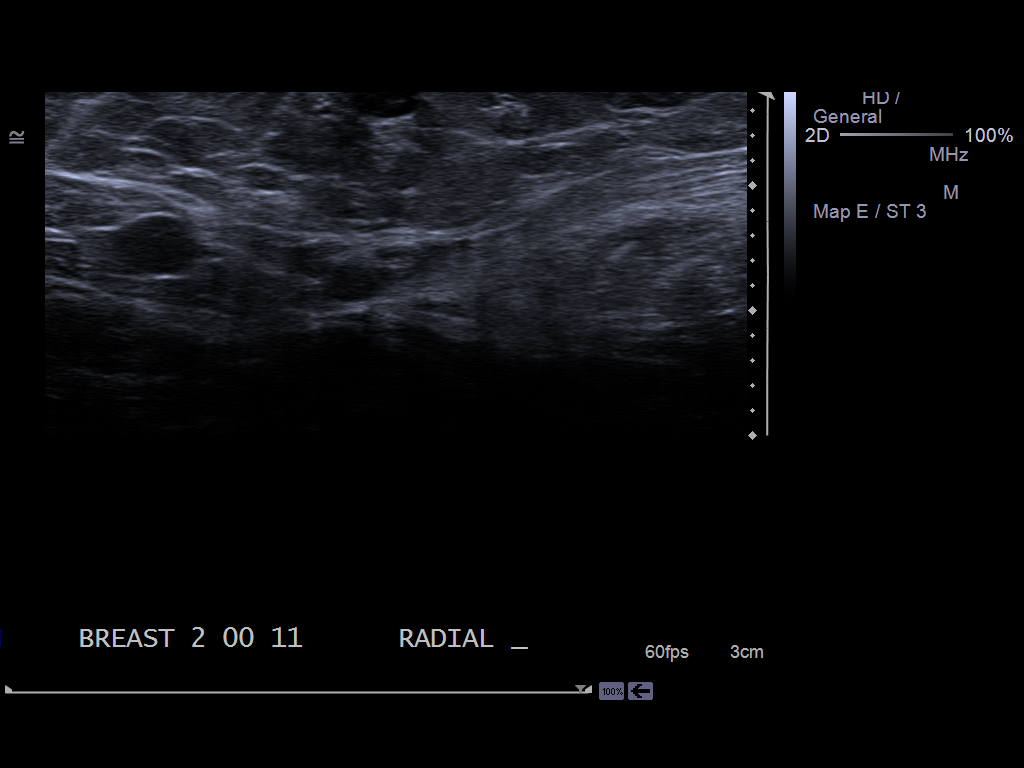
[im 4/6]
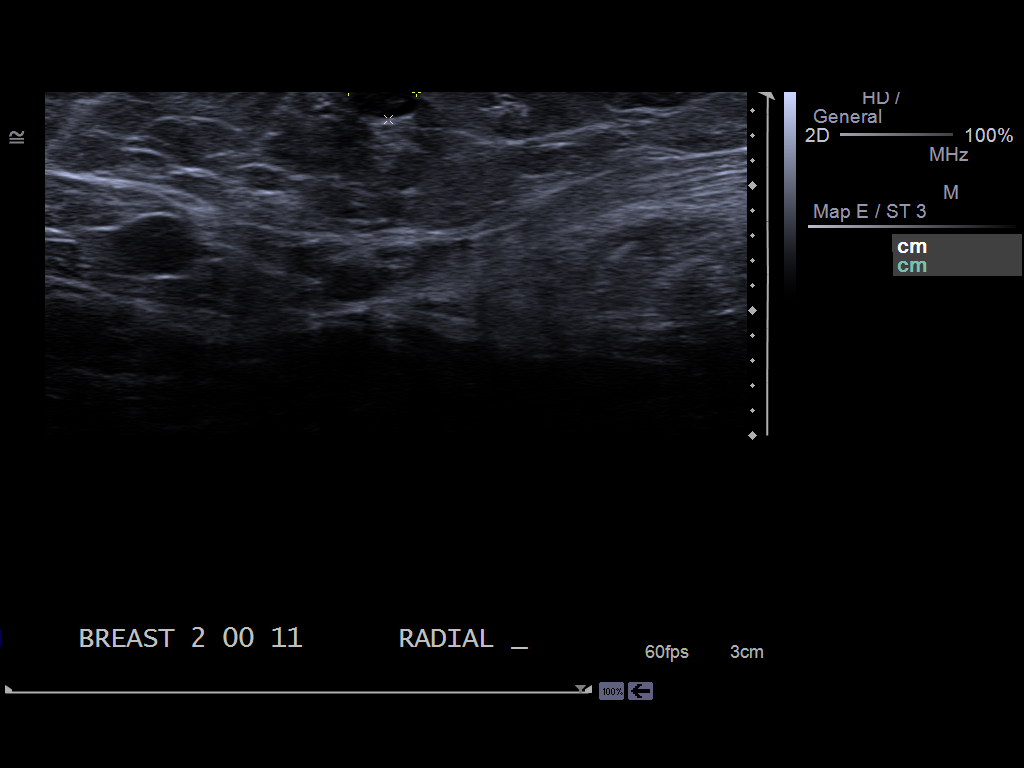
[im 5/6]
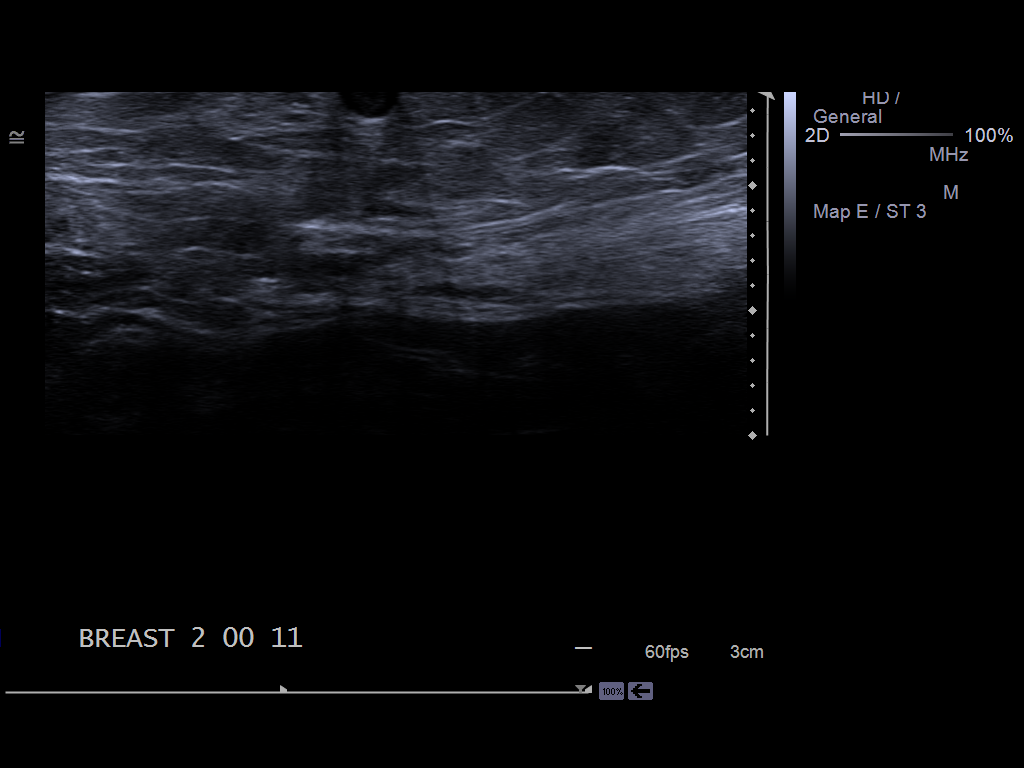
[im 6/6]
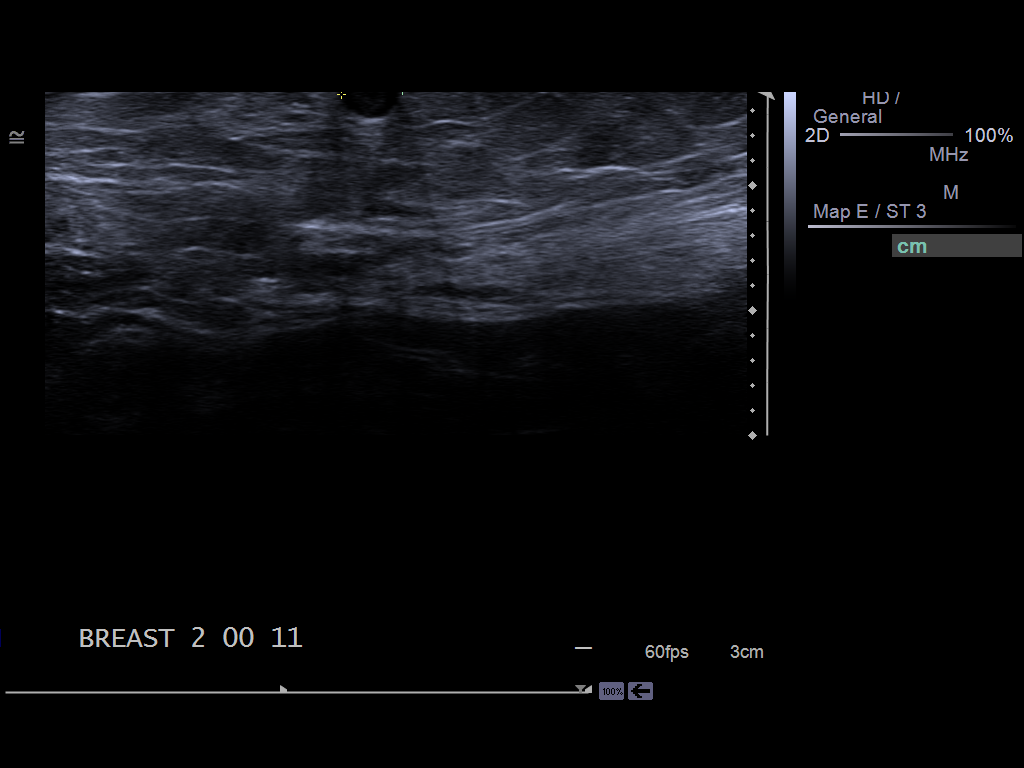

[6 of 6 positions shown; findings below may reference images not displayed]

ACR Breast Density Category b: There are scattered areas of
fibroglandular density.
FINDINGS: Postlumpectomy and radiation changes left breast. Patient with
history of reported prior subcutaneous mastectomy on the right with
associated implant reconstruction. Within the upper-outer left
breast there is a 7 mm oval circumscribed mass. There is an
additional adjacent 10 mm oval circumscribed mass, with possible
fatty hilum. No suspicious calcifications or nonsurgical
architectural distortion identified within either breast.

Mammographic images were processed with CAD.

On physical exam, I palpate a subcutaneous pea-sized mass within the
upper-outer left breast.

Targeted ultrasound is performed, showing a 0.4 x 0.5 x 0.5 cm oval
circumscribed hypoechoic mass in the left breast 2 o'clock position
11 cm from the nipple, a portion of which appears to be located
within the skin. No definite cutaneous connection identified.

Additionally there is a 1.1 cm benign appearing left axillary lymph
node.
IMPRESSION: Probably benign left breast mass, favored to represent a sebaceous
cyst however no definite cutaneous connection is identified.

Postlumpectomy changes left breast.

No definite mammographic evidence for malignancy.

RECOMMENDATION:
Left breast diagnostic mammography and ultrasound in 6 months to
demonstrate stability of probably benign findings.

I have discussed the findings and recommendations with the patient.
Results were also provided in writing at the conclusion of the
visit. If applicable, a reminder letter will be sent to the patient
regarding the next appointment.

BI-RADS CATEGORY  3: Probably benign.

## 2016-06-07 ENCOUNTER — Other Ambulatory Visit: Payer: Self-pay | Admitting: *Deleted

## 2016-06-07 ENCOUNTER — Ambulatory Visit: Payer: Self-pay | Admitting: *Deleted

## 2016-06-07 NOTE — Patient Outreach (Addendum)
[facility]) Care Management  06/07/2016  [name] 1929/09/07 [id number]   Phone call to patient to follow up on hearing test.  Per patient, the visit went well and she has the paperwork that she needs to complete the eligibility process for her hearing aid. Per patient she has an appointment on 12/09/07/15 at the [facility] to complete the eligibility process.  Patient agrees to home visit scheduled for 06/08/16 at 12pm.    Bonny Doon, [place] Management [phone]

## 2016-06-07 NOTE — Patient Outreach (Signed)
[facility]) Care Management  06/07/2016  [name] [birth year] [id number]   Phone call to patient to follow up on her hearing test and to re-schedule home visit scheduled for today. Voicemail message left requesting a return call.    [name] [facility] Care Management [phone]

## 2016-06-08 ENCOUNTER — Other Ambulatory Visit: Payer: Self-pay | Admitting: *Deleted

## 2016-06-08 NOTE — Patient Outreach (Signed)
[facility]) Care Management  [facility] Social Work  06/08/2016  EDRIE NEUMAN 1929/10/24 [id number]  Subjective:  Patient is a 82 year old female.  Completed hearing test on 06/06/16 and is currently going through the process of applying for a hearing aid at no cost through the [facility].  Objective:   Encounter Medications:  Outpatient Encounter Prescriptions as of 06/08/2016  Medication Sig Note  . acetaminophen (TYLENOL) 500 MG tablet Take by mouth. 12/29/2015: Received from: [place]  . aspirin EC 81 MG tablet Take 1 tablet (81 mg total) by mouth daily.   Marland Kitchen atorvastatin (LIPITOR) 20 MG tablet TAKE 1 TABLET (20 MG TOTAL) BY MOUTH AT BEDTIME.   Marland Kitchen b complex vitamins tablet Take 1 tablet by mouth daily.   . Carboxymethylcell-Hypromellose (GENTEAL OP) Apply 1 drop to eye. Each eye as needed   . Cholecalciferol (VITAMIN D3) 50000 units CAPS Take 5,000 Int'l Units/day by mouth once a week. Reported on 01/25/2016   . Cyanocobalamin (VITAMIN B-12) 1000 MCG SUBL  03/16/2016: Received [facility]: [facility]  . dicyclomine (BENTYL) 10 MG capsule Take by mouth. 12/29/2015: Received from: [place]  . erythromycin ophthalmic ointment  03/16/2016: Received from: [facility]  . estradiol (ESTRACE VAGINAL) 0.1 MG/GM vaginal cream Place 1 Applicatorful vaginally at bedtime.   . ferrous sulfate 325 (65 FE) MG tablet Take 1 tablet (325 mg total) by mouth daily with breakfast. (Patient taking differently: Take 325 mg by mouth as needed. )   . fluticasone (FLONASE) 50 MCG/ACT nasal spray SPRAY 2 SPRAYS EACH INTO BOTH NOSTRILS ONCE DAILY EACH NIGHT. 10/24/2015: Received from: External Pharmacy  . hydrocortisone (ANUSOL-HC) 25 MG suppository Place 1 suppository (25 mg total) rectally 2 (two) times daily.   Marland Kitchen loperamide (IMODIUM A-D) 2 MG tablet Take 2 mg by mouth 4 (four) times daily as needed for diarrhea or loose stools.    . meclizine (ANTIVERT) 25 MG tablet Take 1 tablet (25 mg total) by mouth every 8 (eight) hours as needed for dizziness.   . memantine (NAMENDA TITRATION PAK) tablet pack Titration pack, take as directed   . metFORMIN (GLUCOPHAGE) 500 MG tablet Take 1 tablet (500 mg total) by mouth 2 (two) times daily with a meal. (Patient taking differently: Take 500 mg by mouth daily. )   . mirabegron ER (MYRBETRIQ) 25 MG TB24 tablet Take 25 mg by mouth daily.   . Misc. Devices ([place]) MISC Rolling walker; knee sprain, arthritis; LON 99 months   . neomycin-polymyxin-hydrocortisone (CORTISPORIN) 3.5-10000-1 otic suspension Reported on 01/05/2016 12/22/2015: Received from: External Pharmacy  . nystatin cream (MYCOSTATIN) Apply 1 application topically 2 (two) times daily.   Marland Kitchen omeprazole (PRILOSEC) 40 MG capsule Take 40 mg by mouth daily.  11/30/2015: Received from: External Pharmacy  . ondansetron (ZOFRAN) 4 MG tablet Take 4 mg by mouth every 8 (eight) hours as needed for nausea or vomiting. Reported on 12/22/2015 01/12/2016: PRN  . polyethylene glycol powder (GLYCOLAX/MIRALAX) powder Take by mouth. 05/09/2016: Received from: [place]: Take 17 g by mouth once daily. Mix in 4-8ounces of fluid prior to taking.  . Probiotic Product (PROBIOTIC COLON SUPPORT) CAPS Take 1 capsule by mouth daily.  08/15/2015: Takes 1 capsule by mouth daily at night  . sertraline (ZOLOFT) 50 MG tablet Take 1 tablet (50 mg total) by mouth daily.   . Simethicone (GAS-X EXTRA STRENGTH) 125 MG CAPS Take  1 capsule by mouth as needed. Reported on 11/24/2015    No facility-administered encounter medications on file as of 06/08/2016.     Functional Status:  In your present state of health, do you have any difficulty performing the following activities: 05/09/2016 05/04/2016  Hearing? [name]  Vision? Y Y  Difficulty concentrating or making decisions? N N  Walking or climbing stairs? Y Y  Dressing or  bathing? N N  Doing errands, shopping? Y Y  Preparing Food and eating ? - -  Using the Toilet? - -  In the past six months, have you accidently leaked urine? - -  Do you have problems with loss of bowel control? - -  Managing your Medications? - -  Managing your Finances? - -  Housekeeping or managing your Housekeeping? - -  Some recent data might be hidden    Fall/Depression Screening:  PHQ 2/9 Scores 05/14/2016 05/09/2016 05/04/2016 04/10/2016 04/03/2016 04/02/2016 02/29/2016  PHQ - 2 Score 1 0 1 0 0 1 1  PHQ- 9 Score - - - - - - -    Assessment:  Home visit completed. Patient's daughter and grand daughter present for visit. Patient completed hearing test on 06/06/16.  Patient has an appointment on 07/26/16 at 9:30am to complete eligibility process. Patient to go to the [facility], [place], [place] [id number]. Patient's daughter agrees to take patient to this appointment and was reminded to bring her proof of income and the hearing test.  Plan:  This Education officer, museum will follow up with patient within 1 month regarding the status of her hearing aid.

## 2016-06-12 ENCOUNTER — Ambulatory Visit: Payer: Commercial Managed Care - HMO | Admitting: Physical Therapy

## 2016-06-13 ENCOUNTER — Ambulatory Visit: Payer: Commercial Managed Care - HMO | Admitting: Urology

## 2016-06-13 DIAGNOSIS — K5732 Diverticulitis of large intestine without perforation or abscess without bleeding: Secondary | ICD-10-CM | POA: Diagnosis not present

## 2016-06-13 DIAGNOSIS — R197 Diarrhea, unspecified: Secondary | ICD-10-CM | POA: Diagnosis not present

## 2016-06-15 DIAGNOSIS — R197 Diarrhea, unspecified: Secondary | ICD-10-CM | POA: Diagnosis not present

## 2016-06-18 ENCOUNTER — Telehealth: Payer: Self-pay | Admitting: Family Medicine

## 2016-06-18 ENCOUNTER — Encounter: Payer: Self-pay | Admitting: Urology

## 2016-06-18 ENCOUNTER — Ambulatory Visit (INDEPENDENT_AMBULATORY_CARE_PROVIDER_SITE_OTHER): Payer: Commercial Managed Care - HMO | Admitting: Urology

## 2016-06-18 VITALS — BP 175/65 | HR 69 | Ht 60.0 in | Wt 163.4 lb

## 2016-06-18 DIAGNOSIS — R3129 Other microscopic hematuria: Secondary | ICD-10-CM | POA: Diagnosis not present

## 2016-06-18 DIAGNOSIS — N3010 Interstitial cystitis (chronic) without hematuria: Secondary | ICD-10-CM | POA: Diagnosis not present

## 2016-06-18 DIAGNOSIS — R32 Unspecified urinary incontinence: Secondary | ICD-10-CM

## 2016-06-18 DIAGNOSIS — N390 Urinary tract infection, site not specified: Secondary | ICD-10-CM

## 2016-06-18 DIAGNOSIS — N952 Postmenopausal atrophic vaginitis: Secondary | ICD-10-CM | POA: Diagnosis not present

## 2016-06-18 DIAGNOSIS — R296 Repeated falls: Secondary | ICD-10-CM

## 2016-06-18 DIAGNOSIS — K644 Residual hemorrhoidal skin tags: Secondary | ICD-10-CM | POA: Insufficient documentation

## 2016-06-18 LAB — MICROSCOPIC EXAMINATION
Epithelial Cells (non renal): >10 /hpf — AB (ref 0–10)
WBC, UA: >30 /hpf — AB (ref 0–?)

## 2016-06-18 LAB — URINALYSIS, COMPLETE
BILIRUBIN UA: NEGATIVE
GLUCOSE, UA: NEGATIVE
KETONES UA: NEGATIVE
NITRITE UA: NEGATIVE
Urobilinogen, Ur: 0.2 mg/dL (ref 0.2–1.0)
pH, UA: 5 (ref 5.0–7.5)

## 2016-06-18 MED ORDER — LIDOCAINE HCL 2 % EX GEL
1.0000 "application " | Freq: Once | CUTANEOUS | Status: AC
  Administered 2016-06-18: 1 via URETHRAL

## 2016-06-18 MED ORDER — SODIUM BICARBONATE 8.4 % IV SOLN
11.0000 mL | Freq: Once | INTRAVENOUS | Status: AC
  Administered 2016-06-18: 11 mL

## 2016-06-18 NOTE — Progress Notes (Signed)
Bladder Rescue Solution Instillation  Due to IC patient is present today for a Rescue Solution Treatment.  Patient was cleaned and prepped in a sterile fashion with betadine and lidocaine 2% jelly was instilled into the urethra.  A 14 FR catheter was inserted, urine return was noted 76ml, urine was yellow in color.  Instilled a solution consisting of 69ml of Sodium Bicarb, 2 ml Lidocaine and 1 ml of Heparin. The catheter was then removed. Patient tolerated well, no complications were noted.   Performed by: Zara Council PA-C

## 2016-06-18 NOTE — Assessment & Plan Note (Signed)
Refer to surgeon. 

## 2016-06-18 NOTE — Telephone Encounter (Signed)
Please see note from GI Patient needs referral to surgeon for hemorrhoids; not GI, this needs to go to a general surgeon who can treat hemorrhoids; thank you

## 2016-06-18 NOTE — Progress Notes (Signed)
8:13 PM   [name] [birth year] [id number]  Referring provider: Arnetha Courser, MD [address] [place], [place] [address]  Chief Complaint  Patient presents with  . Cystitis    Rescue Solution    HPI: Patient is an 82 year old Caucasian female with a history of IC, fall risk, a history of hematuria, incontinence and a history of recurrent UTI's who presents today for rescue installation.     Fall risk She continues to have falling accidents.    History of hematuria Patient had a CT abdomen and pelvis wo contrast on 08/03/2015 which noted a stable hyperdense cyst in the left kidney.  A cystoscopy on 01/04/2016 which was negative.   She denies any gross hematuria.  History of recurrent UTI's Patient has had greater than 4 documented UTI's over the last year.  She has had multiple organisms with variable resistant pattern.  She is applying the vaginal estrogen cream 3 nights weekly.   She is also taking a probiotic.  Interstitial cystitis Patient presents today for a rescue solution.  She feels she is having improvement with the instillations.  She gets relief of her bladder pain for a few days after the solution is given.    Incontinence Patient currently on Myrbetriq 25 mg daily.  She feels they are providing benefit.  PVR is 20 mL.     PMH: Past Medical History:  Diagnosis Date  . Anxiety and depression   . Breast cancer ([place]) 2015   LT LUMPECTOMY  . Carpal tunnel syndrome   . Chest pain    SECONDARY TO GERD  . Chronic interstitial cystitis   . Chronic right hip pain   . Depression   . Diabetes mellitus without complication ([place])   . Diverticulosis   . Dyslipidemia   . Essential hypertension, benign   . Fibromyalgia   . Frequent falls   . GERD (gastroesophageal reflux disease)   . Hearing loss of left ear   . History of fractured rib   . History of TIA (transient ischemic attack)   . Irritable bowel   . MS (multiple sclerosis) ([place])     . Osteoarthritis   . Paroxysmal supraventricular tachycardia ([place])   . Peripheral neuropathy (HCC)    MILD  . Personal history of radiation therapy 2015   BREAST CA  . Protein malnutrition ([place])   . Pure hypercholesterolemia   . Recurrent UTI   . Skin cancer   . SOB (shortness of breath)    SECONDARY TO REFLUX  . Solitary pulmonary nodule on lung CT 12/04/2015   3 mm LLL lung nodule June 2016, March 2017  . Tachycardia, paroxysmal (HCC)    Reportedly Paroxysmal Supraventricular Tachycardia, but unable to find documentation confirming this  . Thyroid nodule   . Vertigo   . Vitamin B 12 deficiency     Surgical History: Past Surgical History:  Procedure Laterality Date  . APPENDECTOMY    . AUGMENTATION MAMMAPLASTY Right 1975   BREAST BREAST ONLY  . BLADDER TACKING     X 3  . BREAST LUMPECTOMY    . BREAST SURGERY    . CARDIAC CATHETERIZATION  10/2001   Normal coronary arteries with the exception of 20% proximal D1  . CHOLECYSTECTOMY    . COLONOSCOPY WITH PROPOFOL N/A 01/21/2015   Procedure: COLONOSCOPY WITH PROPOFOL;  Surgeon: Josefine Class, MD;  Location: [facility] ENDOSCOPY;  Service: Endoscopy;  Laterality: N/A;  . ESOPHAGOGASTRODUODENOSCOPY  N/A 01/21/2015   Procedure: ESOPHAGOGASTRODUODENOSCOPY (EGD);  Surgeon: Josefine Class, MD;  Location: [facility] ENDOSCOPY;  Service: Endoscopy;  Laterality: N/A;  . EYE SURGERIES     WITH BUCKLE DETACHMENT OF THE RETINA  . HEMORRHOID SURGERY     WITH RECONSTRUCTION  . MASTECTOMY Right 1970   SUBCUTANEOUS - NO BREAST CANCER  . TONSILLECTOMY    . TOTAL ABDOMINAL HYSTERECTOMY W/ BILATERAL SALPINGOOPHORECTOMY    . TYMPANOPLASTY      Home Medications:    Medication List       Accurate as of 06/18/16 11:59 PM. Always use your most recent med list.          acetaminophen 500 MG tablet Commonly known as:  TYLENOL Take by mouth.   aspirin EC 81 MG tablet Take 1 tablet (81 mg total) by mouth daily.   atorvastatin 20 MG  tablet Commonly known as:  LIPITOR TAKE 1 TABLET (20 MG TOTAL) BY MOUTH AT BEDTIME.   b complex vitamins tablet Take 1 tablet by mouth daily.   ciprofloxacin 500 MG tablet Commonly known as:  CIPRO Take by mouth.   dicyclomine 10 MG capsule Commonly known as:  BENTYL Take by mouth.   erythromycin ophthalmic ointment   ESTRACE VAGINAL 0.1 MG/GM vaginal cream Generic drug:  estradiol Place 1 Applicatorful vaginally at bedtime.   ferrous sulfate 325 (65 FE) MG tablet Take 1 tablet (325 mg total) by mouth daily with breakfast.   fluticasone 50 MCG/ACT nasal spray Commonly known as:  FLONASE SPRAY 2 SPRAYS EACH INTO BOTH NOSTRILS ONCE DAILY EACH NIGHT.   GAS-X EXTRA STRENGTH 125 MG Caps Generic drug:  Simethicone Take 1 capsule by mouth as needed. Reported on 11/24/2015   GENTEAL OP Apply 1 drop to eye. Each eye as needed   Normal Rolling walker; knee sprain, arthritis; LON 99 months   hydrocortisone 25 MG suppository Commonly known as:  ANUSOL-HC Place 1 suppository (25 mg total) rectally 2 (two) times daily.   loperamide 2 MG tablet Commonly known as:  IMODIUM A-D Take 2 mg by mouth 4 (four) times daily as needed for diarrhea or loose stools.   meclizine 25 MG tablet Commonly known as:  ANTIVERT Take 1 tablet (25 mg total) by mouth every 8 (eight) hours as needed for dizziness.   memantine tablet pack Commonly known as:  NAMENDA TITRATION PAK Titration pack, take as directed   metFORMIN 500 MG tablet Commonly known as:  GLUCOPHAGE Take 1 tablet (500 mg total) by mouth 2 (two) times daily with a meal.   MYRBETRIQ 25 MG Tb24 tablet Generic drug:  mirabegron ER Take 25 mg by mouth daily.   neomycin-polymyxin-hydrocortisone 3.5-10000-1 otic suspension Commonly known as:  CORTISPORIN Reported on 01/05/2016   nystatin cream Commonly known as:  MYCOSTATIN Apply 1 application topically 2 (two) times daily.   omeprazole 40 MG  capsule Commonly known as:  PRILOSEC Take 40 mg by mouth daily.   ondansetron 4 MG tablet Commonly known as:  ZOFRAN Take 4 mg by mouth every 8 (eight) hours as needed for nausea or vomiting. Reported on 12/22/2015   polyethylene glycol powder powder Commonly known as:  GLYCOLAX/MIRALAX Take by mouth.   PROBIOTIC COLON SUPPORT Caps Take 1 capsule by mouth daily.   sertraline 50 MG tablet Commonly known as:  ZOLOFT Take 1 tablet (50 mg total) by mouth daily.   Vitamin B-12 1000 MCG Subl   Vitamin D3 50000 units Caps Take  5,000 Int'l Units/day by mouth once a week. Reported on 01/25/2016       Allergies:  Allergies  Allergen Reactions  . Aricept [Donepezil Hcl] Other (See Comments)    hallucinations  . Biaxin [Clarithromycin] Other (See Comments) and Diarrhea  . Copaxone [Glatiramer Acetate]   . Decadron [Dexamethasone] Nausea And Vomiting and Other (See Comments)    Other reaction(s): Muscle Pain Reaction:  Abdominal pain  . Dexamethasone Sodium Phosphate Other (See Comments)  . Diltiazem Hcl Other (See Comments)    Reaction:  Unknown   . Diltiazem Hcl Other (See Comments)  . Flagyl [Metronidazole] Nausea Only and Diarrhea    Other reaction(s): Vomiting  . Gabapentin Other (See Comments)    "burning all over" Reaction:  Unknown   . Iohexol Swelling and Other (See Comments)     Desc: tongue swelling with "IVP dye" sccording to nurses notes with a lumbar myelo-12/09- asm, Onset Date: [id number]  Pts tongue swells.  Clementeen Hoof [Iodinated Diagnostic Agents] Swelling and Other (See Comments)    Passed out  Pts tongue swells.   . Levothyroxine Nausea Only  . Sulfa Antibiotics Other (See Comments)    Reaction:  Unknown   . Sulfonamide Derivatives Other (See Comments)    Reaction:  Unknown   . Synthroid [Levothyroxine Sodium]   . Copaxone  [Glatiramer] Rash  . Interferon Beta-1a Other (See Comments) and Rash    Reaction:  Unknown     Family History: Family History   Problem Relation Age of Onset  . ALS Mother   . Kidney disease Sister   . Arthritis Sister   . Aneurysm Brother   . Heart disease Daughter   . Colon cancer    . Breast cancer Neg Hx     Social History:  reports that she has never smoked. She has never used smokeless tobacco. She reports that she does not drink alcohol or use drugs.  ROS: UROLOGY Frequent Urination?: Yes Hard to postpone urination?: Yes Burning/pain with urination?: Yes Get up at night to urinate?: Yes Leakage of urine?: No Urine stream starts and stops?: No Trouble starting stream?: No Do you have to strain to urinate?: No Blood in urine?: No Urinary tract infection?: No Sexually transmitted disease?: No Injury to kidneys or bladder?: No Painful intercourse?: No Weak stream?: No Currently pregnant?: No Vaginal bleeding?: No Last menstrual period?: n  Gastrointestinal Nausea?: No Vomiting?: No Indigestion/heartburn?: Yes Diarrhea?: Yes Constipation?: Yes  Constitutional Fever: No Night sweats?: Yes Weight loss?: No Fatigue?: Yes  Skin Skin rash/lesions?: No Itching?: No  Eyes Blurred vision?: Yes Double vision?: No  Ears/Nose/Throat Sore throat?: No Sinus problems?: Yes  Hematologic/Lymphatic Swollen glands?: No Easy bruising?: No  Cardiovascular Leg swelling?: No Chest pain?: No  Respiratory Cough?: No Shortness of breath?: No  Endocrine Excessive thirst?: No  Musculoskeletal Back pain?: Yes Joint pain?: Yes  Neurological Headaches?: Yes Dizziness?: Yes  Psychologic Depression?: Yes Anxiety?: Yes  Physical Exam: Blood pressure (!) 175/65, pulse 69, height 5' (1.524 m), weight 163 lb 6.4 oz (74.1 kg). Constitutional: Well nourished. Alert and oriented, No acute distress. HEENT: [place] AT, moist mucus membranes. Trachea midline, no masses. Cardiovascular: No clubbing, cyanosis, or edema. Respiratory: Normal respiratory effort, no increased work of breathing. GI:  Abdomen is soft, non tender, non distended, no abdominal masses.  GU: No CVA tenderness.  No bladder fullness or masses.   Skin: No rashes, bruises or suspicious lesions. Lymph: No cervical or inguinal adenopathy. Neurologic: Grossly intact,  no focal deficits, moving all 4 extremities. Psychiatric: Normal mood and affect.  Laboratory Data: Lab Results  Component Value Date   WBC 4.4 04/03/2016   HGB 12.9 04/03/2016   HCT 39.7 04/03/2016   MCV 92.3 04/03/2016   PLT 186 04/03/2016   Lab Results  Component Value Date   CREATININE 1.10 (H) 04/03/2016   Lab Results  Component Value Date   HGBA1C 6.5 (H) 04/03/2016    Assessment & Plan:    1. Interstitial cystitis  - Patient's rescue installation is performed today.    - She does want to continue the instillations at this time.     - She will return next week for her next treatment.    2. Recurrent UTI's  - No UTI symptoms today.    - UA at baseline   3. Microscopic hematuria  - Work up completed in 2017.  No malignancies discovered.    - UA checked yearly.    - No recent gross hematuria.   4. Falling  - Continue to assist the patient with transfers to table   5. Vaginal atrophy  - patient will continue the vaginal estrogen cream nightly for one more week, then switch to three nights weekly  - patient given vaginal estrogen cream sample at today's visit (Estrace)  6. Incontinence  - patient currently taking Myrbetriq 25 mg daily  Return in about 1 week (around 06/25/2016) for Rescue solution.  Zara Council, [facility] [address] [place], [place] [address] [phone]

## 2016-06-19 ENCOUNTER — Encounter: Payer: Commercial Managed Care - HMO | Admitting: Physical Therapy

## 2016-06-19 DIAGNOSIS — H66001 Acute suppurative otitis media without spontaneous rupture of ear drum, right ear: Secondary | ICD-10-CM | POA: Diagnosis not present

## 2016-06-21 ENCOUNTER — Encounter: Payer: Self-pay | Admitting: General Surgery

## 2016-06-21 ENCOUNTER — Encounter: Payer: Commercial Managed Care - HMO | Admitting: Physical Therapy

## 2016-06-21 ENCOUNTER — Ambulatory Visit (INDEPENDENT_AMBULATORY_CARE_PROVIDER_SITE_OTHER): Payer: Commercial Managed Care - HMO | Admitting: General Surgery

## 2016-06-21 VITALS — BP 134/74 | HR 72 | Resp 16 | Ht 60.0 in | Wt 153.0 lb

## 2016-06-21 DIAGNOSIS — K6289 Other specified diseases of anus and rectum: Secondary | ICD-10-CM

## 2016-06-21 NOTE — Patient Instructions (Signed)
Follow up appointment to be announced.  

## 2016-06-21 NOTE — Progress Notes (Signed)
Patient ID: [name], female   DOB: [birth year], 82 y.o.   MRN: [id number]  Chief Complaint  Patient presents with  . Other    hemorrhoids    HPI [name] is a 82 y.o. female here today for a evaluation of rectal pain, referred by GI. Patient states she has been having sharp burning pain in the rectal area for two months. She was seen by GI on 05/08/16 and has been using rectal suppositories per their recommendation, with no symptom relief.  History of DCIS, is currently being followed by Dr. Baruch Gouty, last mammogram was in October 2017- stable. I have reviewed the history of present illness with the patient.   HPI  Past Medical History:  Diagnosis Date  . Anxiety and depression   . Breast cancer ([place]) 2015   LT LUMPECTOMY  . Carpal tunnel syndrome   . Chest pain    SECONDARY TO GERD  . Chronic interstitial cystitis   . Chronic right hip pain   . Depression   . Diabetes mellitus without complication ([place])   . Diverticulosis   . Dyslipidemia   . Essential hypertension, benign   . Fibromyalgia   . Frequent falls   . GERD (gastroesophageal reflux disease)   . Hearing loss of left ear   . History of fractured rib   . History of TIA (transient ischemic attack)   . Irritable bowel   . MS (multiple sclerosis) ([place])   . Osteoarthritis   . Paroxysmal supraventricular tachycardia ([place])   . Peripheral neuropathy (HCC)    MILD  . Personal history of radiation therapy 2015   BREAST CA  . Protein malnutrition ([place])   . Pure hypercholesterolemia   . Recurrent UTI   . Skin cancer   . SOB (shortness of breath)    SECONDARY TO REFLUX  . Solitary pulmonary nodule on lung CT 12/04/2015   3 mm LLL lung nodule June 2016, March 2017  . Tachycardia, paroxysmal (HCC)    Reportedly Paroxysmal Supraventricular Tachycardia, but unable to find documentation confirming this  . Thyroid nodule   . Vertigo   . Vitamin B 12 deficiency     Past Surgical History:  Procedure  Laterality Date  . APPENDECTOMY    . AUGMENTATION MAMMAPLASTY Right 1975   BREAST BREAST ONLY  . BLADDER TACKING     X 3  . BREAST LUMPECTOMY    . BREAST SURGERY    . CARDIAC CATHETERIZATION  10/2001   Normal coronary arteries with the exception of 20% proximal D1  . CHOLECYSTECTOMY    . COLONOSCOPY WITH PROPOFOL N/A 01/21/2015   Procedure: COLONOSCOPY WITH PROPOFOL;  Surgeon: Josefine Class, MD;  Location: [facility] ENDOSCOPY;  Service: Endoscopy;  Laterality: N/A;  . ESOPHAGOGASTRODUODENOSCOPY N/A 01/21/2015   Procedure: ESOPHAGOGASTRODUODENOSCOPY (EGD);  Surgeon: Josefine Class, MD;  Location: [facility] ENDOSCOPY;  Service: Endoscopy;  Laterality: N/A;  . EYE SURGERIES     WITH BUCKLE DETACHMENT OF THE RETINA  . HEMORRHOID SURGERY     WITH RECONSTRUCTION  . MASTECTOMY Right 1970   SUBCUTANEOUS - NO BREAST CANCER  . TONSILLECTOMY    . TOTAL ABDOMINAL HYSTERECTOMY W/ BILATERAL SALPINGOOPHORECTOMY    . TYMPANOPLASTY      Family History  Problem Relation Age of Onset  . ALS Mother   . Kidney disease Sister   . Arthritis Sister   . Aneurysm Brother   . Heart disease Daughter   . Colon cancer    .  Breast cancer Neg Hx     Social History Social History  Substance Use Topics  . Smoking status: Never Smoker  . Smokeless tobacco: Never Used  . Alcohol use No    Allergies  Allergen Reactions  . Aricept [Donepezil Hcl] Other (See Comments)    hallucinations  . Biaxin [Clarithromycin] Other (See Comments) and Diarrhea  . Copaxone [Glatiramer Acetate]   . Decadron [Dexamethasone] Nausea And Vomiting and Other (See Comments)    Other reaction(s): Muscle Pain Reaction:  Abdominal pain  . Dexamethasone Sodium Phosphate Other (See Comments)  . Diltiazem Hcl Other (See Comments)    Reaction:  Unknown   . Diltiazem Hcl Other (See Comments)  . Flagyl [Metronidazole] Nausea Only and Diarrhea    Other reaction(s): Vomiting  . Gabapentin Other (See Comments)    "burning all  over" Reaction:  Unknown   . Iohexol Swelling and Other (See Comments)     Desc: tongue swelling with "IVP dye" sccording to nurses notes with a lumbar myelo-12/09- asm, Onset Date: [id number]  Pts tongue swells.  Clementeen Hoof [Iodinated Diagnostic Agents] Swelling and Other (See Comments)    Passed out  Pts tongue swells.   . Levothyroxine Nausea Only  . Sulfa Antibiotics Other (See Comments)    Reaction:  Unknown   . Sulfonamide Derivatives Other (See Comments)    Reaction:  Unknown   . Synthroid [Levothyroxine Sodium]   . Copaxone  [Glatiramer] Rash  . Interferon Beta-1a Other (See Comments) and Rash    Reaction:  Unknown     Current Outpatient Prescriptions  Medication Sig Dispense Refill  . acetaminophen (TYLENOL) 500 MG tablet Take by mouth.    Marland Kitchen aspirin EC 81 MG tablet Take 1 tablet (81 mg total) by mouth daily. 30 tablet 11  . atorvastatin (LIPITOR) 20 MG tablet TAKE 1 TABLET (20 MG TOTAL) BY MOUTH AT BEDTIME. 30 tablet 0  . b complex vitamins tablet Take 1 tablet by mouth daily.    . Carboxymethylcell-Hypromellose (GENTEAL OP) Apply 1 drop to eye. Each eye as needed    . Cholecalciferol (VITAMIN D3) 50000 units CAPS Take 5,000 Int'l Units/day by mouth once a week. Reported on 01/25/2016 1 capsule   . ciprofloxacin (CIPRO) 500 MG tablet Take by mouth.    . Cyanocobalamin (VITAMIN B-12) 1000 MCG SUBL     . dicyclomine (BENTYL) 10 MG capsule Take by mouth.    . erythromycin ophthalmic ointment     . estradiol (ESTRACE VAGINAL) 0.1 MG/GM vaginal cream Place 1 Applicatorful vaginally at bedtime.    . ferrous sulfate 325 (65 FE) MG tablet Take 1 tablet (325 mg total) by mouth daily with breakfast. (Patient taking differently: Take 325 mg by mouth as needed. ) 90 tablet 1  . fluticasone (FLONASE) 50 MCG/ACT nasal spray SPRAY 2 SPRAYS EACH INTO BOTH NOSTRILS ONCE DAILY EACH NIGHT.  5  . hydrocortisone (ANUSOL-HC) 25 MG suppository Place 1 suppository (25 mg total) rectally 2 (two) times  daily. 12 suppository 0  . loperamide (IMODIUM A-D) 2 MG tablet Take 2 mg by mouth 4 (four) times daily as needed for diarrhea or loose stools.    . meclizine (ANTIVERT) 25 MG tablet Take 1 tablet (25 mg total) by mouth every 8 (eight) hours as needed for dizziness. 30 tablet 0  . memantine (NAMENDA TITRATION PAK) tablet pack Titration pack, take as directed 49 tablet 0  . metFORMIN (GLUCOPHAGE) 500 MG tablet Take 1 tablet (500 mg total) by  mouth 2 (two) times daily with a meal. (Patient taking differently: Take 500 mg by mouth daily. ) 180 tablet 1  . mirabegron ER (MYRBETRIQ) 25 MG TB24 tablet Take 25 mg by mouth daily.    . Misc. Devices (HUGO ROLLING WALKER ELITE) MISC Rolling walker; knee sprain, arthritis; LON 99 months 1 each 0  . neomycin-polymyxin-hydrocortisone (CORTISPORIN) 3.5-10000-1 otic suspension Reported on 01/05/2016    . nystatin cream (MYCOSTATIN) Apply 1 application topically 2 (two) times daily. 30 g 0  . omeprazole (PRILOSEC) 40 MG capsule Take 40 mg by mouth daily.     . ondansetron (ZOFRAN) 4 MG tablet Take 4 mg by mouth every 8 (eight) hours as needed for nausea or vomiting. Reported on 12/22/2015    . polyethylene glycol powder (GLYCOLAX/MIRALAX) powder Take by mouth.    . Probiotic Product (PROBIOTIC COLON SUPPORT) CAPS Take 1 capsule by mouth daily.     . sertraline (ZOLOFT) 50 MG tablet Take 1 tablet (50 mg total) by mouth daily. 90 tablet 3  . Simethicone (GAS-X EXTRA STRENGTH) 125 MG CAPS Take 1 capsule by mouth as needed. Reported on 11/24/2015     No current facility-administered medications for this visit.     Review of Systems Review of Systems  Constitutional: Negative.   Respiratory: Negative.   Cardiovascular: Negative.     Blood pressure 134/74, pulse 72, resp. rate 16, height 5' (1.524 m), weight 153 lb (69.4 kg).  Physical Exam Physical Exam  Constitutional: She is oriented to person, place, and time. She appears well-developed and well-nourished.   Eyes: Conjunctivae are normal. No scleral icterus.  Neck: Neck supple.  Cardiovascular: Normal rate, regular rhythm and normal heart sounds.   Pulmonary/Chest: Effort normal and breath sounds normal.  Abdominal: Bowel sounds are normal. There is no tenderness.  Genitourinary: Rectal exam shows no external hemorrhoid, no internal hemorrhoid, no fissure and no mass.  Genitourinary Comments: No external hemorrhoids noted. No mass felt on digital exam. No internal hemorrhoids or other abnormality seen with anoscopy.  Lymphadenopathy:    She has no cervical adenopathy.  Neurological: She is alert and oriented to person, place, and time.  Skin: Skin is warm and dry.  Anoscopy completed with pt consent. No abnormal findings in anal canal or lower rectal mucosa  Data Reviewed Notes reviewed  Assessment     Rectal pain. Stable exam- unable to find source of rectal pain     Plan    Patient to be scheduled for CT scan of pelvis. She has had multiple prior surgeries in lower abdomen This patient has been scheduled for a CT pelvis without contrast at [facility] for 07-02-16 at 1 pm (arrive 12:45 pm). Prep: pick up prep kit. Patient verbalizes understanding.       This information has been scribed by Gaspar Cola CMA.   SANKAR,SEEPLAPUTHUR G 06/21/2016, 11:47 AM

## 2016-06-25 ENCOUNTER — Encounter: Payer: Self-pay | Admitting: Family Medicine

## 2016-06-25 ENCOUNTER — Ambulatory Visit: Payer: Commercial Managed Care - HMO | Admitting: Urology

## 2016-06-25 ENCOUNTER — Ambulatory Visit (INDEPENDENT_AMBULATORY_CARE_PROVIDER_SITE_OTHER): Payer: Commercial Managed Care - HMO | Admitting: Family Medicine

## 2016-06-25 DIAGNOSIS — R296 Repeated falls: Secondary | ICD-10-CM | POA: Diagnosis not present

## 2016-06-25 DIAGNOSIS — I10 Essential (primary) hypertension: Secondary | ICD-10-CM

## 2016-06-25 DIAGNOSIS — E1140 Type 2 diabetes mellitus with diabetic neuropathy, unspecified: Secondary | ICD-10-CM

## 2016-06-25 DIAGNOSIS — F0390 Unspecified dementia without behavioral disturbance: Secondary | ICD-10-CM

## 2016-06-25 DIAGNOSIS — K5790 Diverticulosis of intestine, part unspecified, without perforation or abscess without bleeding: Secondary | ICD-10-CM

## 2016-06-25 DIAGNOSIS — K6289 Other specified diseases of anus and rectum: Secondary | ICD-10-CM

## 2016-06-25 NOTE — Assessment & Plan Note (Signed)
With recent bout of diverticulitis, treated at [facility]

## 2016-06-25 NOTE — Assessment & Plan Note (Addendum)
Mild peripheral neuropathy; thick and long toenails; refer to podiatrist; foot exam by MD today

## 2016-06-25 NOTE — Assessment & Plan Note (Signed)
Worsening; recommended referral to neurologist

## 2016-06-25 NOTE — Assessment & Plan Note (Signed)
Using walker today; refer back to neurologist for evaluation, r/o recurrence of MS, possible NPH

## 2016-06-25 NOTE — Assessment & Plan Note (Signed)
Evaluated by GI and surgeon; CT scan ordered by surgeon, scheduled for next week

## 2016-06-25 NOTE — Progress Notes (Signed)
BP 136/78   Pulse 79   Temp 98.2 F (36.8 C) (Oral)   Resp 14   Wt 153 lb (69.4 kg)   SpO2 96%   BMI 29.88 kg/m    Subjective:    Patient ID: [name], female    DOB: 06-04-1930, 82 y.o.   MRN: [id number]  HPI: [name] is a 82 y.o. female  Chief Complaint  Patient presents with  . Follow-up  . Dizziness   She has a problem with left ear issues and sees ENT; has a tube in the left ear; they have worked her up for dizziness; she is on ciprodex for the left ear right now; just finished a whole bottle of cipro for her diarrhea, saw them at [facility] on Nov 10th for diverticulitis; stool studies reviewed in [place] and all negative; negative for C diff; no parasites in the stool; the diarrhea is better now, mostly solid now  Is going to get a hearing aide; just having hearing loss in the left ear she says  She saw her gastroenterologist; she thought she had hemorrhoids or a fistula; constant pain; just saw the surgeon over there and he didn't see anything wrong around the rectum and ordered a CT scan; she has a lot of gas; she is bloated; no more pain with having BM; just hurts all the time  Sees [facility] for bladder, but missed her appt this morning b/c of dizziness  High cholesterol; watching a good diet; eats ice cream; hypertension; on medicine  Type 2 diabetes; last A1c 6.5 on 04/03/16; the bottom of her feet are burning  Depression screen [facility] 2/9 06/25/2016 05/14/2016 05/09/2016 05/04/2016 04/10/2016  Decreased Interest 0 0 0 0 0  Down, Depressed, Hopeless 1 1 0 1 0  PHQ - 2 Score 1 1 0 1 0  Altered sleeping - - - - -  Tired, decreased energy - - - - -  Change in appetite - - - - -  Feeling bad or failure about yourself  - - - - -  Trouble concentrating - - - - -  Moving slowly or fidgety/restless - - - - -  Suicidal thoughts - - - - -  PHQ-9 Score - - - - -  Difficult doing work/chores - - - - -  Some recent data might be hidden   Relevant past  medical, surgical, family and social history reviewed Past Medical History:  Diagnosis Date  . Anxiety and depression   . Breast cancer ([place]) 2015   LT LUMPECTOMY  . Carpal tunnel syndrome   . Chest pain    SECONDARY TO GERD  . Chronic interstitial cystitis   . Chronic right hip pain   . Depression   . Diabetes mellitus without complication ([place])   . Diverticulosis   . Dyslipidemia   . Essential hypertension, benign   . Fibromyalgia   . Frequent falls   . GERD (gastroesophageal reflux disease)   . Hearing loss of left ear   . History of fractured rib   . History of TIA (transient ischemic attack)   . Irritable bowel   . MS (multiple sclerosis) ([place])   . Osteoarthritis   . Paroxysmal supraventricular tachycardia ([place])   . Peripheral neuropathy (HCC)    MILD  . Personal history of radiation therapy 2015   BREAST CA  . Protein malnutrition ([place])   . Pure hypercholesterolemia   . Recurrent UTI   . Skin cancer   .  SOB (shortness of breath)    SECONDARY TO REFLUX  . Solitary pulmonary nodule on lung CT 12/04/2015   3 mm LLL lung nodule June 2016, March 2017  . Tachycardia, paroxysmal (HCC)    Reportedly Paroxysmal Supraventricular Tachycardia, but unable to find documentation confirming this  . Thyroid nodule   . Vertigo   . Vitamin B 12 deficiency    Past Surgical History:  Procedure Laterality Date  . APPENDECTOMY    . AUGMENTATION MAMMAPLASTY Right 1975   BREAST BREAST ONLY  . BLADDER TACKING     X 3  . BREAST LUMPECTOMY    . BREAST SURGERY    . CARDIAC CATHETERIZATION  10/2001   Normal coronary arteries with the exception of 20% proximal D1  . CHOLECYSTECTOMY    . COLONOSCOPY WITH PROPOFOL N/A 01/21/2015   Procedure: COLONOSCOPY WITH PROPOFOL;  Surgeon: Josefine Class, MD;  [facility] END[facility]COPY;  Service: Endoscopy;  Laterality: N/A;  . ESOPHAGOGASTRODUODENOSCOPY N/A 01/21/2015   Procedure: ESOPHAGOGASTRODUODENOSCOPY (EGD);  Surgeon: Josefine Class,  MD;  Location: [facility] ENDOSCOPY;  Service: Endoscopy;  Laterality: N/A;  . EYE SURGERIES     WITH BUCKLE DETACHMENT OF THE RETINA  . HEMORRHOID SURGERY     WITH RECONSTRUCTION  . MASTECTOMY Right 1970   SUBCUTANEOUS - NO BREAST CANCER  . TONSILLECTOMY    . TOTAL ABDOMINAL HYSTERECTOMY W/ BILATERAL SALPINGOOPHORECTOMY    . TYMPANOPLASTY     Family History  Problem Relation Age of Onset  . ALS Mother   . Kidney disease Sister   . Arthritis Sister   . Aneurysm Brother   . Heart disease Daughter   . Colon cancer    . Breast cancer Neg Hx    Social History  Substance Use Topics  . Smoking status: Never Smoker  . Smokeless tobacco: Never Used  . Alcohol use No   Interim medical history since last visit reviewed. Allergies and medications reviewed  Review of Systems Per HPI unless specifically indicated above     Objective:    BP 136/78   Pulse 79   Temp 98.2 F (36.8 C) (Oral)   Resp 14   Wt 153 lb (69.4 kg)   SpO2 96%   BMI 29.88 kg/m   Wt Readings from Last 3 Encounters:  06/25/16 153 lb (69.4 kg)  06/21/16 153 lb (69.4 kg)  06/18/16 163 lb 6.4 oz (74.1 kg)    Physical Exam  Constitutional: She appears well-developed and well-nourished. No distress.  HENT:  Right Ear: Decreased hearing is noted.  Left Ear: Decreased hearing is noted.  Ears:  Mouth/Throat: Mucous membranes are normal. She has dentures.  Tympanostomy tube LEFT eardrum  Eyes: EOM are normal. No scleral icterus.  Neck: No thyromegaly present.  Cardiovascular: Normal rate and regular rhythm.   Pulmonary/Chest: Effort normal and breath sounds normal.  Abdominal: Bowel sounds are normal. She exhibits no distension. There is no tenderness. There is no guarding.  Full but not hard  Musculoskeletal: She exhibits no edema.       Right knee: She exhibits swelling and deformity.       Left knee: She exhibits swelling and deformity.  Neurological: She is alert.  Named 14 animals in one minute last  time, only 12 this time; named only 7 cities/towns in one minute last time, only 4 this time and named two states Animals: deer, moose, rabbit, chicken, cow, horse, [place] chickens, lamb, fox..., elephant, giraffe..., prairie dog Cities or towns: [place],  [place], [place], [place], [place], [place]  Skin: No pallor.  Psychiatric: She has a normal mood and affect. Her behavior is normal. Judgment and thought content normal. Her mood appears not anxious. Her speech is not delayed and not slurred. She is not slowed and not withdrawn. Cognition and memory are impaired. She does not exhibit a depressed mood. She exhibits abnormal recent memory. She is attentive.   Diabetic Foot Form - Detailed   Diabetic Foot Exam - detailed Diabetic Foot exam was performed with the following findings:  Yes 06/25/2016  1:43 PM  Visual Foot Exam completed.:  Yes  Are the toenails long?:  Yes Are the toenails thick?:  Yes Are the toenails ingrown?:  No Normal Range of Motion:  Yes Pulse Foot Exam completed.:  Yes  Right Dorsalis Pedis:  Present Left Dorsalis Pedis:  Present  Sensory Foot Exam Completed.:  Yes Swelling:  No Semmes-Weinstein Monofilament Test R Site 1-Great Toe:  Pos L Site 1-Great Toe:  Pos  R Site 4:  Pos L Site 4:  Pos  R Site 5:  Pos L Site 5:  Pos        Results for orders placed or performed in visit on 06/18/16  Microscopic Examination  Result Value Ref Range   WBC, UA >30 (A) 0 - 5 /hpf   RBC, UA 3-10 (A) 0 - 2 /hpf   Epithelial Cells (non renal) >10 (A) 0 - 10 /hpf   Bacteria, UA Few None seen/Few  Urinalysis, Complete  Result Value Ref Range   Specific Gravity, UA >1.030 (H) 1.005 - 1.030   pH, UA 5.0 5.0 - 7.5   Color, UA Yellow Yellow   Appearance Ur Cloudy (A) Clear   Leukocytes, UA 2+ (A) Negative   Protein, UA Trace (A) Negative/Trace   Glucose, UA Negative Negative   Ketones, UA Negative Negative   RBC, UA Trace (A) Negative   Bilirubin, UA Negative  Negative   Urobilinogen, Ur 0.2 0.2 - 1.0 mg/dL   Nitrite, UA Negative Negative   Microscopic Examination See below:       Assessment & Plan:   Problem List Items Addressed This Visit      Cardiovascular and Mediastinum   Hypertension goal BP (blood pressure) < 140/90 (Chronic)    Well-controlled today        Digestive   DD (diverticular disease)    With recent bout of diverticulitis, treated at [place]   DM (diabetes mellitus) type II controlled, neurological manifestation ([place])    Mild peripheral neuropathy; thick and long toenails; refer to podiatrist; foot exam by MD today        Nervous and Auditory   Dementia    Worsening; recommended referral to neurologist      Relevant Orders   Ambulatory referral to Neurology     Other   Rectal pain    Evaluated by GI and surgeon; CT scan ordered by surgeon, scheduled for next week      Frequent falls (Chronic)    Using walker today; refer back to neurologist for evaluation, r/o recurrence of MS, possible NPH         Follow up plan: Return in about 3 months (around 10/04/2016) for fasting labs and visit for diabetes and cholesterol.  An after-visit summary was printed and given to the patient at [facility].  Please see the patient instructions which may contain other information and recommendations beyond  what is mentioned above in the assessment and plan.  No orders of the defined types were placed in this encounter.  Orders Placed This Encounter  Procedures  . Ambulatory referral to Neurology

## 2016-06-25 NOTE — Assessment & Plan Note (Signed)
Well controlled today.

## 2016-06-26 ENCOUNTER — Encounter: Payer: Commercial Managed Care - HMO | Admitting: Physical Therapy

## 2016-07-02 ENCOUNTER — Ambulatory Visit: Payer: Commercial Managed Care - HMO

## 2016-07-02 ENCOUNTER — Ambulatory Visit
Admission: RE | Admit: 2016-07-02 | Discharge: 2016-07-02 | Disposition: A | Discharge status: Home / Self Care | Payer: Commercial Managed Care - HMO | Source: Ambulatory Visit | Attending: General Surgery | Admitting: General Surgery

## 2016-07-02 DIAGNOSIS — R102 Pelvic and perineal pain: Secondary | ICD-10-CM | POA: Diagnosis not present

## 2016-07-02 DIAGNOSIS — K5790 Diverticulosis of intestine, part unspecified, without perforation or abscess without bleeding: Secondary | ICD-10-CM | POA: Insufficient documentation

## 2016-07-02 DIAGNOSIS — K6289 Other specified diseases of anus and rectum: Secondary | ICD-10-CM | POA: Insufficient documentation

## 2016-07-02 DIAGNOSIS — N369 Urethral disorder, unspecified: Secondary | ICD-10-CM | POA: Diagnosis not present

## 2016-07-03 ENCOUNTER — Encounter: Payer: Commercial Managed Care - HMO | Admitting: Physical Therapy

## 2016-07-03 ENCOUNTER — Other Ambulatory Visit: Payer: Self-pay | Admitting: *Deleted

## 2016-07-03 ENCOUNTER — Encounter: Payer: Self-pay | Admitting: *Deleted

## 2016-07-03 DIAGNOSIS — Z794 Long term (current) use of insulin: Principal | ICD-10-CM

## 2016-07-03 DIAGNOSIS — E1111 Type 2 diabetes mellitus with ketoacidosis with coma: Secondary | ICD-10-CM

## 2016-07-03 NOTE — Patient Outreach (Signed)
[facility]) Care Management  07/03/2016   [name] June 23, 1930 [id number]  Subjective: RN Health Coach telephone call to patient.  [employer] compliance verified. Per patient the last blood sugar taken was last week. It was 137. Per patient it always runs around that. Patient has not had any symptoms of hyper or hypo glycemia.  Per patient she fell again last week. Patient stated she fell over a basket on her face. Patient stated she is having soreness all over but having pain in her rectum.  Patient went for CT scan of pelvis 07/02/2017. Patient is alert and oriented to self but patient stated she fixed a basket for her bingo meeting and forgot where she put it. Patient stated she still can't find it. Per patient she made an apple pie for her group and forgot it was in the oven and burned it. Patient is showing increasing in forgetfulness.  Per patient her ear drains all the time but mostly at night. When she awakens her gown is wet under her ear.. Patient has an appointment in Dec for hearing aids. This is being handled by [facility] Education officer, museum. Per patient she is unable to afford the antibiotics that was prescribed for her ear. Patient stated the Dr wanted to put her on Cipro.  Patient has agreed to follow up outreach calls   Objective:   Current Medications:  Current Outpatient Prescriptions  Medication Sig Dispense Refill  . acetaminophen (TYLENOL) 500 MG tablet Take by mouth.    Marland Kitchen aspirin EC 81 MG tablet Take 1 tablet (81 mg total) by mouth daily. 30 tablet 11  . atorvastatin (LIPITOR) 20 MG tablet TAKE 1 TABLET (20 MG TOTAL) BY MOUTH AT BEDTIME. 30 tablet 0  . b complex vitamins tablet Take 1 tablet by mouth daily.    . Carboxymethylcell-Hypromellose (GENTEAL OP) Apply 1 drop to eye. Each eye as needed    . Cholecalciferol (VITAMIN D3) 50000 units CAPS Take 5,000 Int'l Units/day by mouth once a week. Reported on 01/25/2016 1 capsule   . Cyanocobalamin (VITAMIN B-12) 1000  MCG SUBL     . dicyclomine (BENTYL) 10 MG capsule Take by mouth.    . erythromycin ophthalmic ointment     . estradiol (ESTRACE VAGINAL) 0.1 MG/GM vaginal cream Place 1 Applicatorful vaginally at bedtime.    . ferrous sulfate 325 (65 FE) MG tablet Take 1 tablet (325 mg total) by mouth daily with breakfast. (Patient taking differently: Take 325 mg by mouth as needed. ) 90 tablet 1  . fluticasone (FLONASE) 50 MCG/ACT nasal spray SPRAY 2 SPRAYS EACH INTO BOTH NOSTRILS ONCE DAILY EACH NIGHT.  5  . hydrocortisone (ANUSOL-HC) 25 MG suppository Place 1 suppository (25 mg total) rectally 2 (two) times daily. 12 suppository 0  . loperamide (IMODIUM A-D) 2 MG tablet Take 2 mg by mouth 4 (four) times daily as needed for diarrhea or loose stools.    . meclizine (ANTIVERT) 25 MG tablet Take 1 tablet (25 mg total) by mouth every 8 (eight) hours as needed for dizziness. 30 tablet 0  . memantine (NAMENDA TITRATION PAK) tablet pack Titration pack, take as directed 49 tablet 0  . metFORMIN (GLUCOPHAGE) 500 MG tablet Take 1 tablet (500 mg total) by mouth 2 (two) times daily with a meal. (Patient taking differently: Take 500 mg by mouth daily. ) 180 tablet 1  . mirabegron ER (MYRBETRIQ) 25 MG TB24 tablet Take 25 mg by mouth daily.    . Misc. Devices (  HUGO ROLLING WALKER ELITE) MISC Rolling walker; knee sprain, arthritis; LON 99 months 1 each 0  . neomycin-polymyxin-hydrocortisone (CORTISPORIN) 3.5-10000-1 otic suspension Reported on 01/05/2016    . nystatin cream (MYCOSTATIN) Apply 1 application topically 2 (two) times daily. 30 g 0  . omeprazole (PRILOSEC) 40 MG capsule Take 40 mg by mouth daily.     . ondansetron (ZOFRAN) 4 MG tablet Take 4 mg by mouth every 8 (eight) hours as needed for nausea or vomiting. Reported on 12/22/2015    . polyethylene glycol powder (GLYCOLAX/MIRALAX) powder Take by mouth.    . Probiotic Product (PROBIOTIC COLON SUPPORT) CAPS Take 1 capsule by mouth daily.     . sertraline (ZOLOFT) 50 MG  tablet Take 1 tablet (50 mg total) by mouth daily. 90 tablet 3  . Simethicone (GAS-X EXTRA STRENGTH) 125 MG CAPS Take 1 capsule by mouth as needed. Reported on 11/24/2015     No current facility-administered medications for this visit.     Functional Status:  In your present state of health, do you have any difficulty performing the following activities: 07/03/2016 06/25/2016  Hearing? [name]  Vision? Y Y  Difficulty concentrating or making decisions? [name]  Walking or climbing stairs? Y Y  Dressing or bathing? N N  Doing errands, shopping? [name]  Preparing Food and eating ? Y -  Using the Toilet? N -  In the past six months, have you accidently leaked urine? Y -  Do you have problems with loss of bowel control? N -  Managing your Medications? N -  Managing your Finances? N -  Housekeeping or managing your Housekeeping? Y -  Some recent data might be hidden    Fall/Depression Screening: PHQ 2/9 Scores 07/03/2016 06/25/2016 05/14/2016 05/09/2016 05/04/2016 04/10/2016 04/03/2016  PHQ - 2 Score 1 1 1  0 1 0 0  PHQ- 9 Score - - - - - - -   THN CM Care Plan Problem One   Flowsheet Row Most Recent Value  Care Plan Problem One  knowledge deficit in self management of diabetes  Role Documenting the Problem One  [facility] [place] for Problem One  Active  THN Long Term Goal (31-90 days)  Patient will continue not have any admissions for diabetes in the next 90 days  THN Long Term Goal Start Date  07/03/16  Interventions for Problem One Long Term Goal  RN reminded patient to keep all apointments with PCP and other caregivers. RN reminded patient the importance of taking all medication as prescribed.  THN CM Short Term Goal #3 (0-30 days)  Patient will not have any falls within the next 30 days  THN CM Short Term Goal #3 Start Date  07/03/16  Interventions for Short Tern Goal #3  RN discussed with patient about fall safety. Patient had a ct scan of the head. Patient is also following up with a  neurologist  THN CM Short Term Goal #4 (0-30 days)  Patient will be able to recognize cold, flu, and pneumonia symptoms within the next 30 days  THN CM Short Term Goal #4 Start Date  07/03/16  Interventions for Short Term Goal #4  RN sent EMMI eduicational information on cold, flu and pneumonia. Patient received the flu shot. Per patient unable to take pneumonia shot. RN follow up with discussion       Assessment:  Patient had recent fall Patient is having constant drainage from ear Patient is unable to afford the antibiotics  Patient is having increase forgetfulness  Plan:  RN referred to pharmacy  RN sent educational material on cold, flu, community acquired pneumonia RN discussed with patient about fall prevention RN will follow up within the month of January  Letisha Yera [place] [facility] [phone]

## 2016-07-05 ENCOUNTER — Ambulatory Visit (INDEPENDENT_AMBULATORY_CARE_PROVIDER_SITE_OTHER): Payer: Commercial Managed Care - HMO | Admitting: General Surgery

## 2016-07-05 ENCOUNTER — Ambulatory Visit: Payer: Commercial Managed Care - HMO | Admitting: General Surgery

## 2016-07-05 ENCOUNTER — Encounter: Payer: Self-pay | Admitting: General Surgery

## 2016-07-05 ENCOUNTER — Encounter: Payer: Commercial Managed Care - HMO | Admitting: Physical Therapy

## 2016-07-05 VITALS — BP 142/70 | HR 82 | Resp 16 | Ht 62.0 in | Wt 158.0 lb

## 2016-07-05 DIAGNOSIS — K6289 Other specified diseases of anus and rectum: Secondary | ICD-10-CM | POA: Diagnosis not present

## 2016-07-05 NOTE — Progress Notes (Signed)
Patient ID: [name], female   DOB: 10/03/1929, 82 y.o.   MRN: [id number]  Chief Complaint  Patient presents with  . Follow-up    CT scan    HPI [name] is a 82 y.o. female here today for a discussion regarding her CT scan results obtained on 07/02/16. She still complains of rectal pain and pressure sensation in the lower abdomen and pelvic area as well as her back. She states she is being followed by urology and undergoes rescue installations weekly for interstitial cystitis. She fell recently before Thanksgiving with no major injuries but complains of some soreness. I have reviewed the history of present illness with the patient.   HPI  Past Medical History:  Diagnosis Date  . Anxiety and depression   . Breast cancer ([place]) 2015   LT LUMPECTOMY  . Carpal tunnel syndrome   . Chest pain    SECONDARY TO GERD  . Chronic interstitial cystitis   . Chronic right hip pain   . Depression   . Diabetes mellitus without complication ([place])   . Diverticulosis   . Dyslipidemia   . Essential hypertension, benign   . Fibromyalgia   . Frequent falls   . GERD (gastroesophageal reflux disease)   . Hearing loss of left ear   . History of fractured rib   . History of TIA (transient ischemic attack)   . Irritable bowel   . MS (multiple sclerosis) ([place])   . Osteoarthritis   . Paroxysmal supraventricular tachycardia ([place])   . Peripheral neuropathy (HCC)    MILD  . Personal history of radiation therapy 2015   BREAST CA  . Protein malnutrition ([place])   . Pure hypercholesterolemia   . Recurrent UTI   . Skin cancer   . SOB (shortness of breath)    SECONDARY TO REFLUX  . Solitary pulmonary nodule on lung CT 12/04/2015   3 mm LLL lung nodule June 2016, March 2017  . Tachycardia, paroxysmal (HCC)    Reportedly Paroxysmal Supraventricular Tachycardia, but unable to find documentation confirming this  . Thyroid nodule   . Vertigo   . Vitamin B 12 deficiency     Past Surgical  History:  Procedure Laterality Date  . APPENDECTOMY    . AUGMENTATION MAMMAPLASTY Right 1975   BREAST BREAST ONLY  . BLADDER TACKING     X 3  . BREAST LUMPECTOMY    . BREAST SURGERY    . CARDIAC CATHETERIZATION  10/2001   Normal coronary arteries with the exception of 20% proximal D1  . CHOLECYSTECTOMY    . COLONOSCOPY WITH PROPOFOL N/A 01/21/2015   Procedure: COLONOSCOPY WITH PROPOFOL;  Surgeon: Josefine Class, MD;  Location[facility] ENDOSCOPY;  Service: Endoscopy;  Laterality: N/A;  . ESOPHAGOGASTRODUODENOSCOPY N/A 01/21/2015   Procedure: ESOPHAGOGASTRODUODENOSCOPY (EGD);  Surgeon: Josefine Class, MD;  Location: [facility] ENDOSCOPY;  Service: Endoscopy;  Laterality: N/A;  . EYE SURGERIES     WITH BUCKLE DETACHMENT OF THE RETINA  . HEMORRHOID SURGERY     WITH RECONSTRUCTION  . MASTECTOMY Right 1970   SUBCUTANEOUS - NO BREAST CANCER  . TONSILLECTOMY    . TOTAL ABDOMINAL HYSTERECTOMY W/ BILATERAL SALPINGOOPHORECTOMY    . TYMPANOPLASTY      Family History  Problem Relation Age of Onset  . ALS Mother   . Kidney disease Sister   . Arthritis Sister   . Aneurysm Brother   . Heart disease Daughter   . Colon cancer    .  Breast cancer Neg Hx     Social History Social History  Substance Use Topics  . Smoking status: Never Smoker  . Smokeless tobacco: Never Used  . Alcohol use No    Allergies  Allergen Reactions  . Aricept [Donepezil Hcl] Other (See Comments)    hallucinations  . Biaxin [Clarithromycin] Other (See Comments) and Diarrhea  . Copaxone [Glatiramer Acetate]   . Decadron [Dexamethasone] Nausea And Vomiting and Other (See Comments)    Other reaction(s): Muscle Pain Reaction:  Abdominal pain  . Dexamethasone Sodium Phosphate Other (See Comments)  . Diltiazem Hcl Other (See Comments)    Reaction:  Unknown   . Diltiazem Hcl Other (See Comments)  . Flagyl [Metronidazole] Nausea Only and Diarrhea    Other reaction(s): Vomiting  . Gabapentin Other (See Comments)     "burning all over" Reaction:  Unknown   . Iohexol Swelling and Other (See Comments)     Desc: tongue swelling with "IVP dye" sccording to nurses notes with a lumbar myelo-12/09- asm, Onset Date: 13244010  Pts tongue swells.  Clementeen Hoof [Iodinated Diagnostic Agents] Swelling and Other (See Comments)    Passed out  Pts tongue swells.   . Levothyroxine Nausea Only  . Sulfa Antibiotics Other (See Comments)    Reaction:  Unknown   . Sulfonamide Derivatives Other (See Comments)    Reaction:  Unknown   . Synthroid [Levothyroxine Sodium]   . Copaxone  [Glatiramer] Rash  . Interferon Beta-1a Other (See Comments) and Rash    Reaction:  Unknown     Current Outpatient Prescriptions  Medication Sig Dispense Refill  . acetaminophen (TYLENOL) 500 MG tablet Take by mouth.    Marland Kitchen aspirin EC 81 MG tablet Take 1 tablet (81 mg total) by mouth daily. 30 tablet 11  . atorvastatin (LIPITOR) 20 MG tablet TAKE 1 TABLET (20 MG TOTAL) BY MOUTH AT BEDTIME. 30 tablet 0  . b complex vitamins tablet Take 1 tablet by mouth daily.    . Carboxymethylcell-Hypromellose (GENTEAL OP) Apply 1 drop to eye. Each eye as needed    . Cholecalciferol (VITAMIN D3) 50000 units CAPS Take 5,000 Int'l Units/day by mouth once a week. Reported on 01/25/2016 1 capsule   . Cyanocobalamin (VITAMIN B-12) 1000 MCG SUBL     . dicyclomine (BENTYL) 10 MG capsule Take by mouth.    . erythromycin ophthalmic ointment     . estradiol (ESTRACE VAGINAL) 0.1 MG/GM vaginal cream Place 1 Applicatorful vaginally at bedtime.    . ferrous sulfate 325 (65 FE) MG tablet Take 1 tablet (325 mg total) by mouth daily with breakfast. (Patient taking differently: Take 325 mg by mouth as needed. ) 90 tablet 1  . fluticasone (FLONASE) 50 MCG/ACT nasal spray SPRAY 2 SPRAYS EACH INTO BOTH NOSTRILS ONCE DAILY EACH NIGHT.  5  . hydrocortisone (ANUSOL-HC) 25 MG suppository Place 1 suppository (25 mg total) rectally 2 (two) times daily. 12 suppository 0  . loperamide  (IMODIUM A-D) 2 MG tablet Take 2 mg by mouth 4 (four) times daily as needed for diarrhea or loose stools.    . meclizine (ANTIVERT) 25 MG tablet Take 1 tablet (25 mg total) by mouth every 8 (eight) hours as needed for dizziness. 30 tablet 0  . memantine (NAMENDA TITRATION PAK) tablet pack Titration pack, take as directed 49 tablet 0  . metFORMIN (GLUCOPHAGE) 500 MG tablet Take 1 tablet (500 mg total) by mouth 2 (two) times daily with a meal. (Patient taking differently: Take  500 mg by mouth daily. ) 180 tablet 1  . mirabegron ER (MYRBETRIQ) 25 MG TB24 tablet Take 25 mg by mouth daily.    . Misc. Devices (HUGO ROLLING WALKER [place]ITE) MISC Rolling walker; knee sprain, arthritis; LON 99 months 1 each 0  . neomycin-polymyxin-hydrocortisone (CORTISPORIN) 3.5-10000-1 otic suspension Reported on 01/05/2016    . nystatin cream (MYCOSTATIN) Apply 1 application topically 2 (two) times daily. 30 g 0  . omeprazole (PRILOSEC) 40 MG capsule Take 40 mg by mouth daily.     . ondansetron (ZOFRAN) 4 MG tablet Take 4 mg by mouth every 8 (eight) hours as needed for nausea or vomiting. Reported on 12/22/2015    . polyethylene glycol powder (GLYCOLAX/MIRALAX) powder Take by mouth.    . Probiotic Product (PROBIOTIC COLON SUPPORT) CAPS Take 1 capsule by mouth daily.     . sertraline (ZOLOFT) 50 MG tablet Take 1 tablet (50 mg total) by mouth daily. 90 tablet 3  . Simethicone (GAS-X EXTRA STRENGTH) 125 MG CAPS Take 1 capsule by mouth as needed. Reported on 11/24/2015     No current facility-administered medications for this visit.     Review of Systems Review of Systems  Constitutional: Negative.   Respiratory: Negative.   Cardiovascular: Negative.     Blood pressure (!) 142/70, pulse 82, resp. rate 16, height 5' 2"  (1.575 m), weight 158 lb (71.7 kg).  Physical Exam Physical Exam  Constitutional: She is oriented to person, place, and time. She appears well-developed and well-nourished.  Neurological: She is alert and  oriented to person, place, and time.  Skin: Skin is warm and dry.  Psychiatric: Her behavior is normal.    Data Reviewed CT scan reviewed- Unchanged focal density at the level of the proximal urethra favoring calcium/urolithiasis within diverticulum, less likely colovesicular fistula given stability. No other findings of concern to account for her rectal/pelvic pain.   Assessment    Rectal pain- no apparent etiology    Plan   Patient was advised in full on the CT findings. At present no further evaluation is recommended. If symptoms worsen we will be able to reassess. CT findings were brought to the attention of her urologist. Also recommend she have her back evaluated in the event that this is causing some of her pressure sensation in the pelvis.     This information has been scribed by Karie Fetch RN, BSN,BC.  Dalonte Hardage G 07/05/2016, 2:15 PM

## 2016-07-05 NOTE — Patient Instructions (Addendum)
Patient is to make f/u appt with urology  The patient is aware to call back for any questions or concerns.

## 2016-07-10 ENCOUNTER — Encounter: Payer: Commercial Managed Care - HMO | Admitting: Physical Therapy

## 2016-07-12 ENCOUNTER — Ambulatory Visit (INDEPENDENT_AMBULATORY_CARE_PROVIDER_SITE_OTHER): Payer: Commercial Managed Care - HMO | Admitting: Urology

## 2016-07-12 ENCOUNTER — Encounter: Payer: Commercial Managed Care - HMO | Admitting: Physical Therapy

## 2016-07-12 ENCOUNTER — Encounter: Payer: Self-pay | Admitting: Urology

## 2016-07-12 ENCOUNTER — Other Ambulatory Visit: Payer: Self-pay | Admitting: Family Medicine

## 2016-07-12 VITALS — BP 151/85 | HR 65 | Ht 60.0 in | Wt 163.0 lb

## 2016-07-12 DIAGNOSIS — N3010 Interstitial cystitis (chronic) without hematuria: Secondary | ICD-10-CM

## 2016-07-12 DIAGNOSIS — N390 Urinary tract infection, site not specified: Secondary | ICD-10-CM | POA: Diagnosis not present

## 2016-07-12 DIAGNOSIS — R32 Unspecified urinary incontinence: Secondary | ICD-10-CM

## 2016-07-12 DIAGNOSIS — N952 Postmenopausal atrophic vaginitis: Secondary | ICD-10-CM

## 2016-07-12 DIAGNOSIS — R296 Repeated falls: Secondary | ICD-10-CM | POA: Diagnosis not present

## 2016-07-12 DIAGNOSIS — R3129 Other microscopic hematuria: Secondary | ICD-10-CM

## 2016-07-12 DIAGNOSIS — N361 Urethral diverticulum: Secondary | ICD-10-CM | POA: Diagnosis not present

## 2016-07-12 LAB — URINALYSIS, COMPLETE
BILIRUBIN UA: NEGATIVE
Glucose, UA: NEGATIVE
KETONES UA: NEGATIVE
NITRITE UA: NEGATIVE
SPEC GRAV UA: 1.02 (ref 1.005–1.030)
Urobilinogen, Ur: 0.2 mg/dL (ref 0.2–1.0)
pH, UA: 6 (ref 5.0–7.5)

## 2016-07-12 LAB — MICROSCOPIC EXAMINATION

## 2016-07-12 MED ORDER — SODIUM BICARBONATE 8.4 % IV SOLN
11.0000 mL | Freq: Once | INTRAVENOUS | Status: AC
  Administered 2016-07-12: 11 mL

## 2016-07-12 MED ORDER — LIDOCAINE HCL 2 % EX GEL
1.0000 "application " | Freq: Once | CUTANEOUS | Status: AC
  Administered 2016-07-12: 1 via URETHRAL

## 2016-07-12 MED ORDER — DIAZEPAM 5 MG PO TABS: ORAL_TABLET | ORAL | 0 refills | Status: DC

## 2016-07-12 MED ORDER — LEVOFLOXACIN 250 MG PO TABS: 250.0000 mg | ORAL_TABLET | Freq: Every day | ORAL | 1 refills | Status: DC

## 2016-07-12 NOTE — Progress Notes (Signed)
Bladder Rescue Solution Instillation  Due to interstitial cystitis patient is present today for a Rescue Solution Treatment.  Patient was cleaned and prepped in a sterile fashion with betadine and lidocaine 2% jelly was instilled into the urethra.  A 14 FR catheter was inserted, urine return was noted 62ml, urine was yellow in color.  Instilled a solution consisting of 55ml of Sodium Bicarb, 2 ml Lidocaine and 1 ml of Heparin. The catheter was then removed. Patient tolerated well, no complications were noted.   Performed by: Lyndee Hensen CMA  Follow up/ Additional Notes: One week

## 2016-07-12 NOTE — Progress Notes (Signed)
10:04 AM   [name] [birth year] [id number]  Referring provider: Arnetha Courser, MD [address] [place], [place] [address]  Chief Complaint  Patient presents with  . Cystitis    Rescue Solution    HPI: Patient is an 82 year old Caucasian female with a history of IC, fall risk, a history of hematuria, incontinence and a history of recurrent UTI's who presents today for rescue installation.     Fall risk She continues to have falling accidents.    History of hematuria Patient had a CT abdomen and pelvis wo contrast on 08/03/2015 which noted a stable hyperdense cyst in the left kidney.  A cystoscopy on 01/04/2016 which was negative.   She denies any gross hematuria.  History of recurrent UTI's Patient has had greater than 4 documented UTI's over the last year.  She has had multiple organisms with variable resistant pattern.  She is applying the vaginal estrogen cream 3 nights weekly.   She is also taking a probiotic.  She is having pelvic pain and dysuria.  UA is suspicious for an infection.    Interstitial cystitis Patient presents today for a rescue solution.  She feels she is having improvement with the instillations.  She gets relief of her bladder pain for a few days after the solution is given.    Incontinence Patient currently on Myrbetriq 25 mg daily.  She feels they are providing benefit.  PVR is 20 mL.    Pelvic pain Patient has been complaining of pelvic pain for 2 months.  She recently underwent a pelvic CT and there were findings suspicious for an urethral diverticulum.   Unchanged focal density at the level of the proximal urethra favoring calcium/urolithiasis within diverticulum, less likely colovesicular fistula given stability.  Diverticulosis without acute diverticulitis.  I have independently reviewed the films.     PMH: Past Medical History:  Diagnosis Date  . Anxiety and depression   . Breast cancer ([place]) 2015   LT LUMPECTOMY  .  Carpal tunnel syndrome   . Chest pain    SECONDARY TO GERD  . Chronic interstitial cystitis   . Chronic right hip pain   . Depression   . Diabetes mellitus without complication ([place])   . Diverticulosis   . Dyslipidemia   . Essential hypertension, benign   . Fibromyalgia   . Frequent falls   . GERD (gastroesophageal reflux disease)   . Hearing loss of left ear   . History of fractured rib   . History of TIA (transient ischemic attack)   . Irritable bowel   . MS (multiple sclerosis) ([place])   . Osteoarthritis   . Paroxysmal supraventricular tachycardia ([place])   . Peripheral neuropathy (HCC)    MILD  . Personal history of radiation therapy 2015   BREAST CA  . Protein malnutrition ([place])   . Pure hypercholesterolemia   . Recurrent UTI   . Skin cancer   . SOB (shortness of breath)    SECONDARY TO REFLUX  . Solitary pulmonary nodule on lung CT 12/04/2015   3 mm LLL lung nodule June 2016, March 2017  . Tachycardia, paroxysmal (HCC)    Reportedly Paroxysmal Supraventricular Tachycardia, but unable to find documentation confirming this  . Thyroid nodule   . Vertigo   . Vitamin B 12 deficiency     Surgical History: Past Surgical History:  Procedure Laterality Date  . APPENDECTOMY    . [place]  BREAST BREAST ONLY  . BLADDER TACKING     X 3  . BREAST LUMPECTOMY    . BREAST SURGERY    . CARDIAC CATHETERIZATION  10/2001   Normal coronary arteries with the exception of 20% proximal D1  . CHOLECYSTECTOMY    . COLONOSCOPY WITH PROPOFOL N/A 01/21/2015   Procedure: COLONOSCOPY WITH PROPOFOL;  Surgeon: Josefine Class, MD;  [facility] ENDOSCOPY;  Service: Endoscopy;  Laterality: N/A;  . ESOPHAGOGASTRODUODENOSCOPY N/A 01/21/2015   Procedure: ESOPHAGOGASTRODUODENOSCOPY (EGD);  Surgeon: Josefine Class, MD;  Location: [facility] ENDOSCOPY;  Service: Endoscopy;  Laterality: N/A;  . EYE SURGERIES     WITH BUCKLE DETACHMENT OF THE RETINA  . HEMORRHOID  SURGERY     WITH RECONSTRUCTION  . MASTECTOMY Right 1970   SUBCUTANEOUS - NO BREAST CANCER  . TONSILLECTOMY    . TOTAL ABDOMINAL HYSTERECTOMY W/ BILATERAL SALPINGOOPHORECTOMY    . TYMPANOPLASTY      Home Medications:    Medication List       Accurate as of 07/12/16 10:04 AM. Always use your most recent med list.          acetaminophen 500 MG tablet Commonly known as:  TYLENOL Take by mouth.   aspirin EC 81 MG tablet Take 1 tablet (81 mg total) by mouth daily.   atorvastatin 20 MG tablet Commonly known as:  LIPITOR TAKE 1 TABLET (20 MG TOTAL) BY MOUTH AT BEDTIME.   b complex vitamins tablet Take 1 tablet by mouth daily.   diazepam 5 MG tablet Commonly known as:  VALIUM Take one tablet 30 minutes prior to MRI   dicyclomine 10 MG capsule Commonly known as:  BENTYL Take by mouth.   erythromycin ophthalmic ointment   ESTRACE VAGINAL 0.1 MG/GM vaginal cream Generic drug:  estradiol Place 1 Applicatorful vaginally at bedtime.   ferrous sulfate 325 (65 FE) MG tablet Take 1 tablet (325 mg total) by mouth daily with breakfast.   fluticasone 50 MCG/ACT nasal spray Commonly known as:  FLONASE SPRAY 2 SPRAYS EACH INTO BOTH NOSTRILS ONCE DAILY EACH NIGHT.   GAS-X EXTRA STRENGTH 125 MG Caps Generic drug:  Simethicone Take 1 capsule by mouth as needed. Reported on 11/24/2015   GENTEAL OP Apply 1 drop to eye. Each eye as needed   [place] Rolling walker; knee sprain, arthritis; LON 99 months   hydrocortisone 25 MG suppository Commonly known as:  ANUSOL-HC Place 1 suppository (25 mg total) rectally 2 (two) times daily.   levofloxacin 250 MG tablet Commonly known as:  LEVAQUIN Take 1 tablet (250 mg total) by mouth daily.   loperamide 2 MG tablet Commonly known as:  IMODIUM A-D Take 2 mg by mouth 4 (four) times daily as needed for diarrhea or loose stools.   meclizine 25 MG tablet Commonly known as:  ANTIVERT Take 1 tablet (25 mg total) by  mouth every 8 (eight) hours as needed for dizziness.   memantine tablet pack Commonly known as:  NAMENDA TITRATION PAK Titration pack, take as directed   metFORMIN 500 MG tablet Commonly known as:  GLUCOPHAGE Take 1 tablet (500 mg total) by mouth 2 (two) times daily with a meal.   MYRBETRIQ 25 MG Tb24 tablet Generic drug:  mirabegron ER Take 25 mg by mouth daily.   neomycin-polymyxin-hydrocortisone 3.5-10000-1 otic suspension Commonly known as:  CORTISPORIN Reported on 01/05/2016   nystatin cream Commonly known as:  MYCOSTATIN Apply 1 application topically 2 (two) times daily.  omeprazole 40 MG capsule Commonly known as:  PRILOSEC Take 40 mg by mouth daily.   ondansetron 4 MG tablet Commonly known as:  ZOFRAN Take 4 mg by mouth every 8 (eight) hours as needed for nausea or vomiting. Reported on 12/22/2015   polyethylene glycol powder powder Commonly known as:  GLYCOLAX/MIRALAX Take by mouth.   PROBIOTIC COLON SUPPORT Caps Take 1 capsule by mouth daily.   sertraline 50 MG tablet Commonly known as:  ZOLOFT Take 1 tablet (50 mg total) by mouth daily.   Vitamin B-12 1000 MCG Subl   Vitamin D3 50000 units Caps Take 5,000 Int'l Units/day by mouth once a week. Reported on 01/25/2016       Allergies:  Allergies  Allergen Reactions  . Aricept [Donepezil Hcl] Other (See Comments)    hallucinations  . Biaxin [Clarithromycin] Other (See Comments) and Diarrhea  . Copaxone [Glatiramer Acetate]   . Decadron [Dexamethasone] Nausea And Vomiting and Other (See Comments)    Other reaction(s): Muscle Pain Reaction:  Abdominal pain  . Dexamethasone Sodium Phosphate Other (See Comments)  . Diltiazem Hcl Other (See Comments)    Reaction:  Unknown   . Diltiazem Hcl Other (See Comments)  . Flagyl [Metronidazole] Nausea Only and Diarrhea    Other reaction(s): Vomiting  . Gabapentin Other (See Comments)    "burning all over" Reaction:  Unknown   . Iohexol Swelling and Other (See  Comments)     Desc: tongue swelling with "IVP dye" sccording to nurses notes with a lumbar myelo-12/09- asm, Onset Date: 64332951  Pts tongue swells.  Clementeen Hoof [Iodinated Diagnostic Agents] Swelling and Other (See Comments)    Passed out  Pts tongue swells.   . Levothyroxine Nausea Only  . Sulfa Antibiotics Other (See Comments)    Reaction:  Unknown   . Sulfonamide Derivatives Other (See Comments)    Reaction:  Unknown   . Synthroid [Levothyroxine Sodium]   . Copaxone  [Glatiramer] Rash  . Interferon Beta-1a Other (See Comments) and Rash    Reaction:  Unknown     Family History: Family History  Problem Relation Age of Onset  . ALS Mother   . Kidney disease Sister   . Arthritis Sister   . Aneurysm Brother   . Heart disease Daughter   . Colon cancer    . Breast cancer Neg Hx   . Bladder Cancer Neg Hx   . Prostate cancer Neg Hx     Social History:  reports that she has never smoked. She has never used smokeless tobacco. She reports that she does not drink alcohol or use drugs.  ROS: UROLOGY Frequent Urination?: No Hard to postpone urination?: No Burning/pain with urination?: Yes Get up at night to urinate?: Yes Leakage of urine?: Yes Urine stream starts and stops?: No Trouble starting stream?: No Do you have to strain to urinate?: No Blood in urine?: No Urinary tract infection?: No Sexually transmitted disease?: No Injury to kidneys or bladder?: No Painful intercourse?: No Weak stream?: No Currently pregnant?: No Vaginal bleeding?: No Last menstrual period?: n  Gastrointestinal Nausea?: No Vomiting?: No Indigestion/heartburn?: Yes Diarrhea?: Yes Constipation?: Yes  Constitutional Fever: No Night sweats?: Yes Weight loss?: No Fatigue?: Yes  Skin Skin rash/lesions?: No Itching?: No  Eyes Blurred vision?: Yes Double vision?: No  Ears/Nose/Throat Sore throat?: No Sinus problems?: Yes  Hematologic/Lymphatic Swollen glands?: No Easy bruising?:  No  Cardiovascular Leg swelling?: No Chest pain?: No  Respiratory Cough?: No Shortness of breath?: No  Endocrine Excessive thirst?: No  Musculoskeletal Back pain?: Yes Joint pain?: Yes  Neurological Headaches?: Yes Dizziness?: Yes  Psychologic Depression?: Yes Anxiety?: Yes  Physical Exam: Blood pressure (!) 151/85, pulse 65, height 5' (1.524 m), weight 163 lb (73.9 kg). Constitutional: Well nourished. Alert and oriented, No acute distress. HEENT: [place] AT, moist mucus membranes. Trachea midline, no masses. Cardiovascular: No clubbing, cyanosis, or edema. Respiratory: Normal respiratory effort, no increased work of breathing. GI: Abdomen is soft, non tender, non distended, no abdominal masses.  GU: No CVA tenderness.  No bladder fullness or masses.  Atrophic vaginitis.  No stone palpated in urethra.  No masses or induration palpated in the vagina.  Very tender in the bladder neck area and along the urethra.   Skin: No rashes, bruises or suspicious lesions. Lymph: No cervical or inguinal adenopathy. Neurologic: Grossly intact, no focal deficits, moving all 4 extremities. Psychiatric: Normal mood and affect.  Laboratory Data: Lab Results  Component Value Date   WBC 4.4 04/03/2016   HGB 12.9 04/03/2016   HCT 39.7 04/03/2016   MCV 92.3 04/03/2016   PLT 186 04/03/2016   Lab Results  Component Value Date   CREATININE 1.10 (H) 04/03/2016   Lab Results  Component Value Date   HGBA1C 6.5 (H) 04/03/2016   Urinalysis 6-10 WBC's and 3-10 RBC's.  See EPIC.   Pertinent imaging CLINICAL DATA:  Pelvic pain and pressure, low back pain from October 2017. Constant rectal pain. Fell 1 week ago. Status post hemorrhoidectomy, mastectomy and radiation.  EXAM: CT PELVIS WITHOUT CONTRAST  TECHNIQUE: Multidetector CT imaging of the pelvis was performed following the standard protocol without intravenous contrast.  COMPARISON:  CT abdomen and pelvis June 03, 2015  FINDINGS: Urinary Tract: Urinary bladder is decompressed. Irregular unchanged 14 x 4 mm density at the level of the proximal urethra on this noncontrast examination (axial 42/54).  Bowel: Contrast in the bowel to the level of the rectum. Moderate colonic diverticulosis. Status post appendectomy. Small and large bowel are normal in course and caliber without inflammation.  Vascular/Lymphatic: Mild calcific atherosclerosis.  Reproductive:  Status post hysterectomy.  Other: No free fluid or intraperitoneal free air. No focal fluid collections.  Musculoskeletal: Anterior abdominal wall scarring. Status post L4-5 PLIF with solid arthrodesis. Partially imaged severe L3-4 disc height loss, and broad-based disc osteophyte complex compatible with adjacent segment disease. No acute osseous process.  IMPRESSION: Unchanged focal density at the level of the proximal urethra favoring calcium/urolithiasis within diverticulum, less likely colovesicular fistula given stability.  Diverticulosis without acute diverticulitis.   Electronically Signed   By: Elon Alas M.D.   On: 07/02/2016 13:48  Assessment & Plan:    1. Interstitial cystitis  - Patient's rescue installation is performed today.    - She does want to continue the instillations at this time.     - She will return next week for her next treatment.    2. Recurrent UTI's  - UTI symptoms today  - UA is sent for culture  - Started on Levaquin, will adjust if necessary   3. Microscopic hematuria  - Work up completed in 2017.  No malignancies discovered.    - UA checked yearly.    - No recent gross hematuria.   - Possible urethral diverticulum seen on CT performed on 07/02/2016  - MRI is pending  4. Falling  - Continue to assist the patient with transfers to table   5. Vaginal atrophy  - patient will continue the  vaginal estrogen cream nightly for one more week, then switch to three nights weekly  -  patient given vaginal estrogen cream sample at today's visit (Estrace)  6. Incontinence  - patient currently taking Myrbetriq 25 mg daily  7. Pelvic pain  - schedule MRI to evaluate for urethral diverticulum  - given Valium 5 mg to take prior to MRI  - RTC for results  Return in about 1 week (around 07/19/2016) for rescue solution and MRI report.  Zara Council, [facility] [address], [place], [place] [address] [phone]

## 2016-07-13 LAB — BUN+CREAT
BUN/Creatinine Ratio: 18 (ref 12–28)
BUN: 16 mg/dL (ref 8–27)
Creatinine, Ser: 0.89 mg/dL (ref 0.57–1.00)
GFR calc Af Amer: 69 mL/min/{1.73_m2} (ref >59)
GFR calc non Af Amer: 60 mL/min/{1.73_m2} (ref >59)

## 2016-07-13 MED ORDER — MECLIZINE HCL 25 MG PO TABS: 25.0000 mg | ORAL_TABLET | Freq: Three times a day (TID) | ORAL | 2 refills | Status: DC | PRN

## 2016-07-16 ENCOUNTER — Encounter: Payer: Self-pay | Admitting: *Deleted

## 2016-07-16 DIAGNOSIS — F0280 Dementia in other diseases classified elsewhere without behavioral disturbance: Secondary | ICD-10-CM | POA: Diagnosis not present

## 2016-07-16 DIAGNOSIS — G309 Alzheimer's disease, unspecified: Secondary | ICD-10-CM | POA: Diagnosis not present

## 2016-07-16 DIAGNOSIS — Z8659 Personal history of other mental and behavioral disorders: Secondary | ICD-10-CM | POA: Diagnosis not present

## 2016-07-16 DIAGNOSIS — F0150 Vascular dementia without behavioral disturbance: Secondary | ICD-10-CM | POA: Diagnosis not present

## 2016-07-16 DIAGNOSIS — G4700 Insomnia, unspecified: Secondary | ICD-10-CM | POA: Diagnosis not present

## 2016-07-16 LAB — CULTURE, URINE COMPREHENSIVE

## 2016-07-17 ENCOUNTER — Encounter: Payer: Commercial Managed Care - HMO | Admitting: Physical Therapy

## 2016-07-18 ENCOUNTER — Encounter: Payer: Self-pay | Admitting: Urology

## 2016-07-18 ENCOUNTER — Telehealth: Payer: Self-pay | Admitting: Family Medicine

## 2016-07-18 ENCOUNTER — Ambulatory Visit: Payer: Commercial Managed Care - HMO | Admitting: Urology

## 2016-07-18 VITALS — BP 166/84 | HR 71 | Ht 60.0 in | Wt 155.7 lb

## 2016-07-18 DIAGNOSIS — R3129 Other microscopic hematuria: Secondary | ICD-10-CM | POA: Diagnosis not present

## 2016-07-18 DIAGNOSIS — R32 Unspecified urinary incontinence: Secondary | ICD-10-CM | POA: Diagnosis not present

## 2016-07-18 DIAGNOSIS — Z87440 Personal history of urinary (tract) infections: Secondary | ICD-10-CM | POA: Diagnosis not present

## 2016-07-18 DIAGNOSIS — N3010 Interstitial cystitis (chronic) without hematuria: Secondary | ICD-10-CM | POA: Diagnosis not present

## 2016-07-18 DIAGNOSIS — N952 Postmenopausal atrophic vaginitis: Secondary | ICD-10-CM

## 2016-07-18 DIAGNOSIS — R296 Repeated falls: Secondary | ICD-10-CM | POA: Diagnosis not present

## 2016-07-18 DIAGNOSIS — N361 Urethral diverticulum: Secondary | ICD-10-CM | POA: Diagnosis not present

## 2016-07-18 LAB — URINALYSIS, COMPLETE
BILIRUBIN UA: NEGATIVE
GLUCOSE, UA: NEGATIVE
Nitrite, UA: NEGATIVE
PH UA: 5 (ref 5.0–7.5)
RBC UA: NEGATIVE
Specific Gravity, UA: 1.025 (ref 1.005–1.030)
Urobilinogen, Ur: 0.2 mg/dL (ref 0.2–1.0)

## 2016-07-18 LAB — MICROSCOPIC EXAMINATION: Epithelial Cells (non renal): >10 /hpf — AB (ref 0–10)

## 2016-07-18 MED ORDER — LIDOCAINE HCL 2 % EX GEL
1.0000 "application " | Freq: Once | CUTANEOUS | Status: AC
  Administered 2016-07-18: 1 via URETHRAL

## 2016-07-18 MED ORDER — SODIUM BICARBONATE 8.4 % IV SOLN
11.0000 mL | Freq: Once | INTRAVENOUS | Status: AC
  Administered 2016-07-18: 11 mL

## 2016-07-18 NOTE — Progress Notes (Signed)
1:42 PM   [name] 01-03-1930 [id number]  Referring provider: Arnetha Courser, MD [address] [place], [place] [address]  Chief Complaint  Patient presents with  . Cystitis    Rescue solution    HPI: Patient is an 82 year old Caucasian female with a history of IC, fall risk, a history of hematuria, incontinence and a history of recurrent UTI's who presents today for rescue installation.     Fall risk She continues to have falling accidents.    History of hematuria Patient had a CT abdomen and pelvis wo contrast on 08/03/2015 which noted a stable hyperdense cyst in the left kidney.  A cystoscopy on 01/04/2016 which was negative.   She denies any gross hematuria.  History of recurrent UTI's Patient has had greater than 4 documented UTI's over the last year.  She has had multiple organisms with variable resistant pattern.  She is applying the vaginal estrogen cream 3 nights weekly.   She is also taking a probiotic.  She is having pelvic pain and dysuria.  UA is at baseline     Interstitial cystitis Patient presents today for a rescue solution.  She feels she is having improvement with the instillations.  She is complaining of frequency, urgency, dysuria, nocturia and incontinence.  She gets relief of her bladder pain for a few days after the solution is given.  She may have an urethral diverticulum, ultrasound pending.    Incontinence Patient currently on Myrbetriq 25 mg daily.  She feels they are providing benefit.  PVR is 1 mL.    Pelvic pain Patient has been complaining of pelvic pain for 2 months.  She recently underwent a pelvic CT and there were findings suspicious for an urethral diverticulum.   Unchanged focal density at the level of the proximal urethra favoring calcium/urolithiasis within diverticulum, less likely colovesicular fistula given stability.  Diverticulosis without acute diverticulitis.  I have independently reviewed the films.  She cannot  have a MRI due to an implanted non functional pacemaker.     PMH: Past Medical History:  Diagnosis Date  . Anxiety and depression   . Breast cancer ([place]) 2015   LT LUMPECTOMY  . Carpal tunnel syndrome   . Chest pain    SECONDARY TO GERD  . Chronic interstitial cystitis   . Chronic right hip pain   . Depression   . Diabetes mellitus without complication ([place])   . Diverticulosis   . Dyslipidemia   . Essential hypertension, benign   . Fibromyalgia   . Frequent falls   . GERD (gastroesophageal reflux disease)   . Hearing loss of left ear   . History of fractured rib   . History of TIA (transient ischemic attack)   . Irritable bowel   . MS (multiple sclerosis) ([place])   . Osteoarthritis   . Paroxysmal supraventricular tachycardia ([place])   . Peripheral neuropathy ([facility])    MILD  . Personal history of radiation therapy 2015   BREAST CA  . Protein malnutrition ([place])   . Pure hypercholesterolemia   . Recurrent UTI   . Skin cancer   . SOB (shortness of breath)    SECONDARY TO REFLUX  . Solitary pulmonary nodule on lung CT 12/04/2015   3 mm LLL lung nodule June 2016, March 2017  . Tachycardia, paroxysmal (H[facility])    Reportedly Paroxysmal Supraventricular Tachycardia, but unable to find documentation confirming this  . Thyroid nodule   . Vertigo   .  Vitamin B 12 deficiency     Surgical History: Past Surgical History:  Procedure Laterality Date  . APPENDECTOMY    . AUGMENTATION MAMMAPLASTY Right 1975   BREAST BREAST ONLY  . BLADDER TACKING     X 3  . BREAST LUMPECTOMY    . BREAST SURGERY    . CARDIAC CATHETERIZATION  10/2001   Normal coronary arteries with the exception of 20% proximal D1  . CHOLECYSTECTOMY    . COLONOSCOPY WITH PROPOFOL N/A 01/21/2015   Procedure: COLONOSCOPY WITH PROPOFOL;  Surgeon: Josefine Class, MD;  Location[facility] END[facility]COPY;  Service: Endoscopy;  Laterality: N/A;  . ESOPHAGOGASTRODUODENOSCOPY N/A 01/21/2015   Procedure: ESOPHAGOGASTRODUODENOSCOPY  (EGD);  Surgeon: Josefine Class, MD;  Location: [facility] ENDOSCOPY;  Service: Endoscopy;  Laterality: N/A;  . EYE SURGERIES     WITH BUCKLE DETACHMENT OF THE RETINA  . heart monitor     Not being used... Patient can not have a MRI  . HEMORRHOID SURGERY     WITH RECONSTRUCTION  . MASTECTOMY Right 1970   SUBCUTANEOUS - NO BREAST CANCER  . TONSILLECTOMY    . TOTAL ABDOMINAL HYSTERECTOMY W/ BILATERAL SALPINGOOPHORECTOMY    . TYMPANOPLASTY      Home Medications:  Allergies as of 07/18/2016      Reactions   Aricept [donepezil Hcl] Other (See Comments)   hallucinations   Biaxin [clarithromycin] Other (See Comments), Diarrhea   Copaxone [glatiramer Acetate]    Decadron [dexamethasone] Nausea And Vomiting, Other (See Comments)   Other reaction(s): Muscle Pain Reaction:  Abdominal pain   Dexamethasone Sodium Phosphate Other (See Comments)   Diltiazem Hcl Other (See Comments)   Reaction:  Unknown    Diltiazem Hcl Other (See Comments)   Flagyl [metronidazole] Nausea Only, Diarrhea   Other reaction(s): Vomiting   Gabapentin Other (See Comments)   "burning all over" Reaction:  Unknown    Iohexol Swelling, Other (See Comments)    Desc: tongue swelling with "IVP dye" sccording to nurses notes with a lumbar myelo-12/09- asm, Onset Date: [id number]  Pts tongue swells.   Ivp Dye [iodinated Diagnostic Agents] Swelling, Other (See Comments)   Passed out  Pts tongue swells.    Levothyroxine Nausea Only   Sulfa Antibiotics Other (See Comments)   Reaction:  Unknown    Sulfonamide Derivatives Other (See Comments)   Reaction:  Unknown    Synthroid [levothyroxine Sodium]    Copaxone  [glatiramer] Rash   Interferon Beta-1a Other (See Comments), Rash   Reaction:  Unknown       Medication List       Accurate as of 07/18/16 11:59 PM. Always use your most recent med list.          acetaminophen 500 MG tablet Commonly known as:  TYLENOL Take by mouth.   aspirin EC 81 MG tablet Take 1  tablet (81 mg total) by mouth daily.   atorvastatin 20 MG tablet Commonly known as:  LIPITOR TAKE 1 TABLET (20 MG TOTAL) BY MOUTH AT BEDTIME.   b complex vitamins tablet Take 1 tablet by mouth daily.   diazepam 5 MG tablet Commonly known as:  VALIUM Take one tablet 30 minutes prior to MRI   dicyclomine 10 MG capsule Commonly known as:  BENTYL Take by mouth.   erythromycin ophthalmic ointment   ESTRACE VAGINAL 0.1 MG/GM vaginal cream Generic drug:  estradiol Place 1 Applicatorful vaginally at bedtime.   ferrous sulfate 325 (65 FE) MG tablet Take 1 tablet (325 mg total)  by mouth daily with breakfast.   fluticasone 50 MCG/ACT nasal spray Commonly known as:  FLONASE SPRAY 2 SPRAYS EACH INTO BOTH NOSTRILS ONCE DAILY EACH NIGHT.   GAS-X EXTRA STRENGTH 125 MG Caps Generic drug:  Simethicone Take 1 capsule by mouth as needed. Reported on 11/24/2015   GENTEAL OP Apply 1 drop to eye. Each eye as needed   [place] Rolling walker; knee sprain, arthritis; LON 99 months   hydrocortisone 25 MG suppository Commonly known as:  ANUSOL-HC Place 1 suppository (25 mg total) rectally 2 (two) times daily.   levofloxacin 250 MG tablet Commonly known as:  LEVAQUIN Take 1 tablet (250 mg total) by mouth daily.   loperamide 2 MG tablet Commonly known as:  IMODIUM A-D Take 2 mg by mouth 4 (four) times daily as needed for diarrhea or loose stools.   meclizine 25 MG tablet Commonly known as:  ANTIVERT Take 1 tablet (25 mg total) by mouth every 8 (eight) hours as needed for dizziness.   memantine tablet pack Commonly known as:  NAMENDA TITRATION PAK Titration pack, take as directed   metFORMIN 500 MG tablet Commonly known as:  GLUCOPHAGE Take 1 tablet (500 mg total) by mouth 2 (two) times daily with a meal.   MYRBETRIQ 25 MG Tb24 tablet Generic drug:  mirabegron ER Take 25 mg by mouth daily.   neomycin-polymyxin-hydrocortisone 3.5-10000-1 otic  suspension Commonly known as:  CORTISPORIN Reported on 01/05/2016   nystatin cream Commonly known as:  MYCOSTATIN Apply 1 application topically 2 (two) times daily.   omeprazole 40 MG capsule Commonly known as:  PRILOSEC Take 40 mg by mouth daily.   ondansetron 4 MG tablet Commonly known as:  ZOFRAN Take 4 mg by mouth every 8 (eight) hours as needed for nausea or vomiting. Reported on 12/22/2015   polyethylene glycol powder powder Commonly known as:  GLYCOLAX/MIRALAX Take by mouth.   PROBIOTIC COLON SUPPORT Caps Take 1 capsule by mouth daily.   sertraline 50 MG tablet Commonly known as:  ZOLOFT Take 1 tablet (50 mg total) by mouth daily.   Vitamin B-12 1000 MCG Subl   Vitamin D3 50000 units Caps Take 5,000 Int'l Units/day by mouth once a week. Reported on 01/25/2016       Allergies:  Allergies  Allergen Reactions  . Aricept [Donepezil Hcl] Other (See Comments)    hallucinations  . Biaxin [Clarithromycin] Other (See Comments) and Diarrhea  . Copaxone [Glatiramer Acetate]   . Decadron [Dexamethasone] Nausea And Vomiting and Other (See Comments)    Other reaction(s): Muscle Pain Reaction:  Abdominal pain  . Dexamethasone Sodium Phosphate Other (See Comments)  . Diltiazem Hcl Other (See Comments)    Reaction:  Unknown   . Diltiazem Hcl Other (See Comments)  . Flagyl [Metronidazole] Nausea Only and Diarrhea    Other reaction(s): Vomiting  . Gabapentin Other (See Comments)    "burning all over" Reaction:  Unknown   . Iohexol Swelling and Other (See Comments)     Desc: tongue swelling with "IVP dye" sccording to nurses notes with a lumbar myelo-12/09- asm, Onset Date: [id number]  Pts tongue swells.  Clementeen Hoof [Iodinated Diagnostic Agents] Swelling and Other (See Comments)    Passed out  Pts tongue swells.   . Levothyroxine Nausea Only  . Sulfa Antibiotics Other (See Comments)    Reaction:  Unknown   . Sulfonamide Derivatives Other (See Comments)    Reaction:   Unknown   . Synthroid [Levothyroxine  Sodium]   . Copaxone  [Glatiramer] Rash  . Interferon Beta-1a Other (See Comments) and Rash    Reaction:  Unknown     Family History: Family History  Problem Relation Age of Onset  . ALS Mother   . Kidney disease Sister   . Arthritis Sister   . Aneurysm Brother   . Heart disease Daughter   . Colon cancer    . Breast cancer Neg Hx   . Bladder Cancer Neg Hx   . Prostate cancer Neg Hx     Social History:  reports that she has never smoked. She has never used smokeless tobacco. She reports that she does not drink alcohol or use drugs.  ROS: UROLOGY Frequent Urination?: Yes Hard to postpone urination?: Yes Burning/pain with urination?: Yes Get up at night to urinate?: Yes Leakage of urine?: Yes Urine stream starts and stops?: No Trouble starting stream?: No Do you have to strain to urinate?: No Blood in urine?: No Urinary tract infection?: No Sexually transmitted disease?: No Injury to kidneys or bladder?: No Painful intercourse?: No Weak stream?: No Currently pregnant?: No Vaginal bleeding?: No Last menstrual period?: n  Gastrointestinal Nausea?: No Vomiting?: No Indigestion/heartburn?: No Diarrhea?: Yes Constipation?: Yes  Constitutional Fever: No Night sweats?: Yes Weight loss?: No Fatigue?: Yes  Skin Skin rash/lesions?: No Itching?: No  Eyes Blurred vision?: Yes Double vision?: No  Ears/Nose/Throat Sore throat?: No Sinus problems?: Yes  Hematologic/Lymphatic Swollen glands?: No Easy bruising?: No  Cardiovascular Leg swelling?: No Chest pain?: No  Respiratory Cough?: No Shortness of breath?: No  Endocrine Excessive thirst?: No  Musculoskeletal Back pain?: Yes Joint pain?: Yes  Neurological Headaches?: Yes Dizziness?: Yes  Psychologic Depression?: Yes Anxiety?: Yes  Physical Exam: Blood pressure (!) 166/84, pulse 71, height 5' (1.524 m), weight 155 lb 11.2 oz (70.6 kg). Constitutional:  Well nourished. Alert and oriented, No acute distress. HEENT: [place] AT, moist mucus membranes. Trachea midline, no masses. Cardiovascular: No clubbing, cyanosis, or edema. Respiratory: Normal respiratory effort, no increased work of breathing. GI: Abdomen is soft, non tender, non distended, no abdominal masses.  GU: No CVA tenderness.  No bladder fullness or masses.   Skin: No rashes, bruises or suspicious lesions. Lymph: No cervical or inguinal adenopathy. Neurologic: Grossly intact, no focal deficits, moving all 4 extremities. Psychiatric: Normal mood and affect.  Laboratory Data: Lab Results  Component Value Date   WBC 4.4 04/03/2016   HGB 12.9 04/03/2016   HCT 39.7 04/03/2016   MCV 92.3 04/03/2016   PLT 186 04/03/2016   Lab Results  Component Value Date   CREATININE 0.89 07/12/2016   Lab Results  Component Value Date   HGBA1C 6.5 (H) 04/03/2016   Urinalysis 6-10 WBC's and 3-10 RBC's.  See EPIC.   Pertinent imaging CLINICAL DATA:  Pelvic pain and pressure, low back pain from October 2017. Constant rectal pain. Fell 1 week ago. Status post hemorrhoidectomy, mastectomy and radiation.  EXAM: CT PELVIS WITHOUT CONTRAST  TECHNIQUE: Multidetector CT imaging of the pelvis was performed following the standard protocol without intravenous contrast.  COMPARISON:  CT abdomen and pelvis June 03, 2015  FINDINGS: Urinary Tract: Urinary bladder is decompressed. Irregular unchanged 14 x 4 mm density at the level of the proximal urethra on this noncontrast examination (axial 42/54).  Bowel: Contrast in the bowel to the level of the rectum. Moderate colonic diverticulosis. Status post appendectomy. Small and large bowel are normal in course and caliber without inflammation.  Vascular/Lymphatic: Mild calcific atherosclerosis.  Reproductive:  Status post hysterectomy.  Other: No free fluid or intraperitoneal free air. No focal fluid collections.  Musculoskeletal:  Anterior abdominal wall scarring. Status post L4-5 PLIF with solid arthrodesis. Partially imaged severe L3-4 disc height loss, and broad-based disc osteophyte complex compatible with adjacent segment disease. No acute osseous process.  IMPRESSION: Unchanged focal density at the level of the proximal urethra favoring calcium/urolithiasis within diverticulum, less likely colovesicular fistula given stability.  Diverticulosis without acute diverticulitis.   Electronically Signed   By: Elon Alas M.D.   On: 07/02/2016 13:48  Assessment & Plan:    1. Interstitial cystitis  - Patient's rescue installation is performed today.    - She does want to continue the instillations at this time.     - She will return next week for her next treatment.    2. Recurrent UTI's  - urine culture was negative  - she may stop the Levaquin    3. Microscopic hematuria  - Work up completed in 2017.  No malignancies discovered.    - UA checked yearly.    - No recent gross hematuria.   - Possible urethral diverticulum seen on CT performed on 07/02/2016  - [place] is pending  4. Falling  - Continue to assist the patient with transfers to table   5. Vaginal atrophy  - patient will continue the vaginal estrogen cream nightly for one more week, then switch to three nights weekly  - patient given vaginal estrogen cream sample at today's visit (Estrace)  6. Incontinence  - patient currently taking Myrbetriq 25 mg daily  7. Pelvic pain  - schedule MRI to evaluate for urethral diverticulum- patient's daughter contacted the office and patient cannot have MRI due to the fact she has a non functional pacemaker still implanted  - will need to pursue a transvaginal ultrasound for further evaluation for the diverticulum  - RTC for results  Return in about 1 week (around 07/25/2016) for rescue solution.  Zara Council, [facility] [address], [place], [place] [address] [phone]

## 2016-07-18 NOTE — Telephone Encounter (Signed)
Pt needs refill on Meclizine to be sent to [facility] pharmacy.

## 2016-07-18 NOTE — Progress Notes (Signed)
Bladder Rescue Solution Instillation  Due to interstitial cystitis patient is present today for a Rescue Solution Treatment.  Patient was cleaned and prepped in a sterile fashion with betadine and lidocaine 2% jelly was instilled into the urethra.  A 14 FR catheter was inserted, urine return was noted 47ml, urine was yellow in color.  Instilled a solution consisting of 32ml of Sodium Bicarb, 2 ml Lidocaine and 1 ml of Heparin. The catheter was then removed. Patient tolerated well, no complications were noted.   Performed by: Lyndee Hensen CMA  Follow up/ Additional Notes: One week

## 2016-07-18 NOTE — Telephone Encounter (Signed)
Patient called asking for a refill on her Meclizine. Looked over the patient chart and saw it was refilled and sent to her pharmacy on the 8th of this month for 30 pills and 2 refills. Patient should not need any refills for at least 3 months.  I called the patient to see if she is aware that the Med has been refilled and or how she is  taking the medication. Patient did not answer therefore left her a voicemail asking her to call me back regarding this issue.

## 2016-07-19 ENCOUNTER — Other Ambulatory Visit: Payer: Self-pay | Admitting: Family Medicine

## 2016-07-19 ENCOUNTER — Encounter: Payer: Commercial Managed Care - HMO | Admitting: Physical Therapy

## 2016-07-19 DIAGNOSIS — N3010 Interstitial cystitis (chronic) without hematuria: Secondary | ICD-10-CM

## 2016-07-24 ENCOUNTER — Encounter: Payer: Commercial Managed Care - HMO | Admitting: Physical Therapy

## 2016-07-25 ENCOUNTER — Encounter: Payer: Self-pay | Admitting: *Deleted

## 2016-07-25 ENCOUNTER — Other Ambulatory Visit: Payer: Self-pay | Admitting: *Deleted

## 2016-07-25 NOTE — Patient Outreach (Signed)
  [facility]) Care Management  07/25/2016  [name] April 18, 1930 [id number]   Phone call to patient and her daughter to remind them of appointment with the Department of Health and Human Services Division of Services for the Deaf and the Hard of Hearing to complete the eligibility process for the hearing aid on 07/26/16 at 9:30am.  Voicemail message for both patient and patient's daughter on [facility] consent for a return call.    [name] [facility] Care Management [phone]

## 2016-07-26 ENCOUNTER — Encounter: Payer: Commercial Managed Care - HMO | Admitting: Physical Therapy

## 2016-07-26 ENCOUNTER — Other Ambulatory Visit: Payer: Self-pay | Admitting: *Deleted

## 2016-07-26 NOTE — Telephone Encounter (Signed)
This encounter was created in error - please disregard.

## 2016-07-26 NOTE — Patient Outreach (Signed)
[place] [facility]) Care Management  07/26/2016  [name] 06-Jul-1930 [id number]   Phone call to patient to remind her of appointment with the [place] for [employer] and [employer] of [employer] to complete the eligibility process for her hearing aid. This Education officer, museum spoke with patient's spouse who was able to confirm that their daughter was taking patient to the appointment today.   Plan: This Education officer, museum will follow up on status of completion of the eligibility process for the hearing aid within 2 weeks.   [name] [facility] Care Management [phone]

## 2016-07-27 ENCOUNTER — Ambulatory Visit
Admission: RE | Admit: 2016-07-27 | Discharge: 2016-07-27 | Disposition: A | Discharge status: Home / Self Care | Payer: Commercial Managed Care - HMO | Source: Ambulatory Visit | Attending: Urology | Admitting: Urology

## 2016-07-27 ENCOUNTER — Other Ambulatory Visit: Payer: Self-pay

## 2016-07-27 DIAGNOSIS — N361 Urethral diverticulum: Secondary | ICD-10-CM | POA: Insufficient documentation

## 2016-07-27 DIAGNOSIS — N3010 Interstitial cystitis (chronic) without hematuria: Secondary | ICD-10-CM

## 2016-07-28 ENCOUNTER — Other Ambulatory Visit: Payer: Self-pay | Admitting: Family Medicine

## 2016-07-31 ENCOUNTER — Encounter: Payer: Self-pay | Admitting: Family Medicine

## 2016-07-31 ENCOUNTER — Ambulatory Visit (INDEPENDENT_AMBULATORY_CARE_PROVIDER_SITE_OTHER): Payer: Commercial Managed Care - HMO | Admitting: Family Medicine

## 2016-07-31 DIAGNOSIS — I10 Essential (primary) hypertension: Secondary | ICD-10-CM

## 2016-07-31 DIAGNOSIS — E1140 Type 2 diabetes mellitus with diabetic neuropathy, unspecified: Secondary | ICD-10-CM | POA: Diagnosis not present

## 2016-07-31 DIAGNOSIS — H81312 Aural vertigo, left ear: Secondary | ICD-10-CM | POA: Insufficient documentation

## 2016-07-31 MED ORDER — MECLIZINE HCL 25 MG PO TABS: 25.0000 mg | ORAL_TABLET | Freq: Three times a day (TID) | ORAL | 2 refills | Status: DC | PRN

## 2016-07-31 NOTE — Assessment & Plan Note (Signed)
Foot exam by MD today 

## 2016-07-31 NOTE — Progress Notes (Signed)
BP 128/70   Pulse 81   Temp 98.9 F (37.2 C)   Resp 16   Wt 156 lb 6 oz (70.9 kg)   SpO2 95%   BMI 30.54 kg/m    Subjective:    Patient ID: [name], female    DOB: Jun 17, 1930, 82 y.o.   MRN: [id number]  HPI: [name] is a 82 y.o. female  Chief Complaint  Patient presents with  . Medication Refill   She has had dizzy spells for 50 years; she has been taking antivert for decades; she gets so dizzy she has to crawl to the bathroom sometimes; she only gets it really bad rarely; maybe 3 times over the years; uses the antivert for help; she has seen ENT for this; one down here in [facility] and she also saw an ENT who operated on her left ear in [place], operated on the left ear; her ear runs and she gets soaking wet with water from the left side; he said he had to get some of the fluid to test it  Hx of low vitamin B12 and hx of low vitamin D; just taking one pill a week of vitamin D; she is still taking B12 Taking one baby aspirin a day  She sees Larene Beach for urinary issues, had vaginal ultrasound  She has diabetes; foot exam today by doctor  Depression screen [facility] 2/9 07/31/2016 07/03/2016 06/25/2016 05/14/2016 05/09/2016  Decreased Interest 0 0 0 0 0  Down, Depressed, Hopeless 0 1 1 1  0  PHQ - 2 Score 0 1 1 1  0  Altered sleeping - - - - -  Tired, decreased energy - - - - -  Change in appetite - - - - -  Feeling bad or failure about yourself  - - - - -  Trouble concentrating - - - - -  Moving slowly or fidgety/restless - - - - -  Suicidal thoughts - - - - -  PHQ-9 Score - - - - -  Difficult doing work/chores - - - - -  Some recent data might be hidden    No flowsheet data found.  Relevant past medical, surgical, family and social history reviewed Past Medical History:  Diagnosis Date  . Anxiety and depression   . Breast cancer ([place]) 2015   LT LUMPECTOMY  . Carpal tunnel syndrome   . Chest pain    SECONDARY TO GERD  . Chronic interstitial  cystitis   . Chronic right hip pain   . Depression   . Diabetes mellitus without complication ([place])   . Diverticulosis   . Dyslipidemia   . Essential hypertension, benign   . Fibromyalgia   . Frequent falls   . GERD (gastroesophageal reflux disease)   . Hearing loss of left ear   . History of fractured rib   . History of TIA (transient ischemic attack)   . Irritable bowel   . MS (multiple sclerosis) ([place])   . Osteoarthritis   . Paroxysmal supraventricular tachycardia ([place])   . Peripheral neuropathy ([facility])    MILD  . Personal history of radiation therapy 2015   BREAST CA  . Protein malnutrition ([place])   . Pure hypercholesterolemia   . Recurrent UTI   . Skin cancer   . SOB (shortness of breath)    SECONDARY TO REFLUX  . Solitary pulmonary nodule on lung CT 12/04/2015   3 mm LLL lung nodule June 2016, March 2017  . Tachycardia, paroxysmal ([place])  Reportedly Paroxysmal Supraventricular Tachycardia, but unable to find documentation confirming this  . Thyroid nodule   . Vertigo   . Vitamin B 12 deficiency    Past Surgical History:  Procedure Laterality Date  . APPENDECTOMY    . AUGMENTATION MAMMAPLASTY Right 1975   BREAST BREAST ONLY  . BLADDER TACKING     X 3  . BREAST LUMPECTOMY    . BREAST SURGERY    . CARDIAC CATHETERIZATION  10/2001   Normal coronary arteries with the exception of 20% proximal D1  . CHOLECYSTECTOMY    . COLONOSCOPY WITH PROPOFOL N/A 01/21/2015   Procedure: COLONOSCOPY WITH PROPOFOL;  Surgeon: Josefine Class, MD;  Location: [facility] ENDOSCOPY;  Service: Endoscopy;  Laterality: N/A;  . ESOPHAGOGASTRODUODENOSCOPY N/A 01/21/2015   Procedure: ESOPHAGOGASTRODUODENOSCOPY (EGD);  Surgeon: Josefine Class, MD;  Location: [facility] ENDOSCOPY;  Service: Endoscopy;  Laterality: N/A;  . EYE SURGERIES     WITH BUCKLE DETACHMENT OF THE RETINA  . heart monitor     Not being used... Patient can not have a MRI  . HEMORRHOID SURGERY     WITH RECONSTRUCTION  .  MASTECTOMY Right 1970   SUBCUTANEOUS - NO BREAST CANCER  . TONSILLECTOMY    . TOTAL ABDOMINAL HYSTERECTOMY W/ BILATERAL SALPINGOOPHORECTOMY    . TYMPANOPLASTY     Family History  Problem Relation Age of Onset  . ALS Mother   . Kidney disease Sister   . Arthritis Sister   . Aneurysm Brother   . Heart disease Daughter   . Colon cancer    . Breast cancer Neg Hx   . Bladder Cancer Neg Hx   . Prostate cancer Neg Hx    Social History  Substance Use Topics  . Smoking status: Never Smoker  . Smokeless tobacco: Never Used  . Alcohol use No    Interim medical history since last visit reviewed. Allergies and medications reviewed  Review of Systems Per HPI unless specifically indicated above     Objective:    BP 128/70   Pulse 81   Temp 98.9 F (37.2 C)   Resp 16   Wt 156 lb 6 oz (70.9 kg)   SpO2 95%   BMI 30.54 kg/m   Wt Readings from Last 3 Encounters:  07/31/16 156 lb 6 oz (70.9 kg)  07/18/16 155 lb 11.2 oz (70.6 kg)  07/12/16 163 lb (73.9 kg)    Physical Exam  Constitutional: She appears well-developed and well-nourished.  HENT:  Right Ear: Hearing and external ear normal.  Left Ear: Hearing and external ear normal.  Mouth/Throat: Oropharynx is clear and moist and mucous membranes are normal.  Blue tube in the left ear  Eyes: EOM are normal. No scleral icterus.  Cardiovascular: Normal rate and regular rhythm.   Pulmonary/Chest: Effort normal and breath sounds normal.  Lymphadenopathy:    She has no cervical adenopathy.  Psychiatric: She has a normal mood and affect. Her behavior is normal.   Diabetic Foot Form - Detailed   Diabetic Foot Exam - detailed Diabetic Foot exam was performed with the following findings:  Yes 07/31/2016  9:22 AM  Visual Foot Exam completed.:  Yes  Are the toenails ingrown?:  No Normal Range of Motion:  Yes Pulse Foot Exam completed.:  Yes  Right Dorsalis Pedis:  Present Left Dorsalis Pedis:  Present  Sensory Foot Exam Completed.:   Yes Swelling:  No Semmes-Weinstein Monofilament Test R Site 1-Great Toe:  Neg L Site 1-Great Toe:  Neg  R Site 4:  Pos L Site 4:  Pos  R Site 5:  Pos L Site 5:  Pos       Results for orders placed or performed in visit on 07/18/16  Microscopic Examination  Result Value Ref Range   WBC, UA 6-10 (A) 0 - 5 /hpf   RBC, UA 3-10 (A) 0 - 2 /hpf   Epithelial Cells (non renal) >10 (A) 0 - 10 /hpf   Bacteria, UA Few None seen/Few  Urinalysis, Complete  Result Value Ref Range   Specific Gravity, UA 1.025 1.005 - 1.030   pH, UA 5.0 5.0 - 7.5   Color, UA Yellow Yellow   Appearance Ur Hazy (A) Clear   Leukocytes, UA Trace (A) Negative   Protein, UA Trace (A) Negative/Trace   Glucose, UA Negative Negative   Ketones, UA Trace (A) Negative   RBC, UA Negative Negative   Bilirubin, UA Negative Negative   Urobilinogen, Ur 0.2 0.2 - 1.0 mg/dL   Nitrite, UA Negative Negative   Microscopic Examination See below:       Assessment & Plan:   Problem List Items Addressed This Visit      Cardiovascular and Mediastinum   Hypertension goal BP (blood pressure) < 140/90 (Chronic)    Excellent control of BP today; try to follow DASH guidelines        Endocrine   DM (diabetes mellitus) type II controlled, neurological manifestation ([place])    Foot exam by MD today        Nervous and Auditory   Vertigo, aural, left    She has seen ENT; tube in place; continue antivert PRN; fall precautions encouraged         Follow up plan: Return in about 2 months (around 10/04/2016) for fasting labs and visit with Dr. Sanda Klein.  An after-visit summary was printed and given to the patient at [facility].  Please see the patient instructions which may contain other information and recommendations beyond what is mentioned above in the assessment and plan.  Meds ordered this encounter  Medications  . meclizine (ANTIVERT) 25 MG tablet    Sig: Take 1 tablet (25 mg total) by mouth every 8 (eight) hours as needed for  dizziness.    Dispense:  30 tablet    Refill:  2    No orders of the defined types were placed in this encounter.

## 2016-07-31 NOTE — Patient Instructions (Signed)
Use the antivert if needed Return for regular appointment with fasting labs

## 2016-07-31 NOTE — Assessment & Plan Note (Signed)
Excellent control of BP today; try to follow DASH guidelines

## 2016-07-31 NOTE — Assessment & Plan Note (Signed)
She has seen ENT; tube in place; continue antivert PRN; fall precautions encouraged

## 2016-08-02 ENCOUNTER — Other Ambulatory Visit: Payer: Self-pay

## 2016-08-02 ENCOUNTER — Encounter: Payer: Commercial Managed Care - HMO | Admitting: Physical Therapy

## 2016-08-02 MED ORDER — ATORVASTATIN CALCIUM 20 MG PO TABS: 20.0000 mg | ORAL_TABLET | Freq: Every day | ORAL | 2 refills | Status: DC

## 2016-08-02 NOTE — Telephone Encounter (Signed)
Lipids and ALT from August 2017 reviewed Rx approved

## 2016-08-07 ENCOUNTER — Other Ambulatory Visit: Payer: Self-pay | Admitting: *Deleted

## 2016-08-07 NOTE — Patient Outreach (Signed)
[facility]) Care Management  08/07/2016  [name] August 24, 1929 [id number]   Phone call from patient stating that she completed the eligibility appointment for her hearing aid through the Department of Health and Human Services for the Deaf and Hard of Hearing. Per patient, the determination process will take up to three months, she will be notified in a letter.   Plan:  This social worker will complete discharge home visit in January.   [name] [facility] Care Management [phone]

## 2016-08-08 NOTE — Telephone Encounter (Signed)
Glitch in computer system, I was not receiving refill requests electronically; system corrected by IT yesterday; addressing refills now ----------------------------

## 2016-08-09 ENCOUNTER — Ambulatory Visit (INDEPENDENT_AMBULATORY_CARE_PROVIDER_SITE_OTHER): Payer: Medicare HMO | Admitting: Urology

## 2016-08-09 ENCOUNTER — Ambulatory Visit: Payer: Commercial Managed Care - HMO | Admitting: *Deleted

## 2016-08-09 ENCOUNTER — Encounter: Payer: Self-pay | Admitting: Urology

## 2016-08-09 VITALS — BP 172/91 | HR 71 | Ht 60.0 in | Wt 155.6 lb

## 2016-08-09 DIAGNOSIS — R3129 Other microscopic hematuria: Secondary | ICD-10-CM | POA: Diagnosis not present

## 2016-08-09 DIAGNOSIS — R102 Pelvic and perineal pain: Secondary | ICD-10-CM | POA: Diagnosis not present

## 2016-08-09 DIAGNOSIS — N390 Urinary tract infection, site not specified: Secondary | ICD-10-CM | POA: Diagnosis not present

## 2016-08-09 DIAGNOSIS — R296 Repeated falls: Secondary | ICD-10-CM | POA: Diagnosis not present

## 2016-08-09 DIAGNOSIS — R32 Unspecified urinary incontinence: Secondary | ICD-10-CM | POA: Diagnosis not present

## 2016-08-09 DIAGNOSIS — N3010 Interstitial cystitis (chronic) without hematuria: Secondary | ICD-10-CM | POA: Diagnosis not present

## 2016-08-09 DIAGNOSIS — N952 Postmenopausal atrophic vaginitis: Secondary | ICD-10-CM | POA: Diagnosis not present

## 2016-08-09 MED ORDER — OXYCODONE-ACETAMINOPHEN 2.5-325 MG PO TABS: 1.0000 | ORAL_TABLET | ORAL | 0 refills | Status: DC | PRN

## 2016-08-09 MED ORDER — SODIUM BICARBONATE 8.4 % IV SOLN
11.0000 mL | Freq: Once | INTRAVENOUS | Status: AC
  Administered 2016-08-09: 11 mL

## 2016-08-09 MED ORDER — NITROFURANTOIN MONOHYD MACRO 100 MG PO CAPS: 100.0000 mg | ORAL_CAPSULE | Freq: Two times a day (BID) | ORAL | 0 refills | Status: DC

## 2016-08-09 NOTE — Addendum Note (Signed)
Addended by: Toniann Fail C on: 08/09/2016 04:17 PM   Modules accepted: Orders

## 2016-08-09 NOTE — Progress Notes (Addendum)
4:03 PM   [name] 01-15-1930 [id number]  Referring provider: Arnetha Courser, MD [address] [place], [place] [address]  Chief Complaint  Patient presents with  . Results    [place]    HPI: Patient is an 83 year old Caucasian female with a history of IC, fall risk, a history of hematuria, incontinence and a history of recurrent UTI's who presents today to discuss her pelvic ultrasound result and a rescue installation.     Fall risk She continues to have falling accidents.    History of hematuria Patient had a CT abdomen and pelvis wo contrast on 08/03/2015 which noted a stable hyperdense cyst in the left kidney.  A cystoscopy on 01/04/2016 which was negative.   She denies any gross hematuria.  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  She could not undergo a MRI of this area due to having a non-functioning pacemaker in place.  She underwent a pelvic ultrasound demonstrated a soft tissue thickening of the proximal urethra measuring up to 2 cm.  This may represent urethral diverticulum with inspissated debris versus true soft tissue mass.  I have independently reviewed the films.  This may be the cause of her hematuria.    History of recurrent UTI's Patient has had greater than 4 documented UTI's over the last year.  She has had multiple organisms with variable resistant pattern.  She is applying the vaginal estrogen cream 3 nights weekly.   She is also taking a probiotic.  She is having pelvic pain and dysuria.  UA is unremarkable.    Interstitial cystitis Patient presents today for a rescue solution.  She feels she is having improvement with the instillations.  She is complaining of frequency, urgency, dysuria, nocturia and incontinence.  She gets relief of her bladder pain for a few days after the solution is given.  She may have an urethral diverticulum, ultrasound completed.    Incontinence Patient  currently on Myrbetriq 25 mg daily.  She feels they are providing benefit.  PVR is 1 mL.    Pelvic pain Patient has been complaining of pelvic pain for 2 months.  She recently underwent a pelvic CT and there were findings suspicious for an urethral diverticulum.   Unchanged focal density at the level of the proximal urethra favoring calcium/urolithiasis within diverticulum, less likely colovesicular fistula given stability.  Diverticulosis without acute diverticulitis.  I have independently reviewed the films.  She cannot have a MRI due to an implanted non functional pacemaker.  See history of hematuria.     PMH: Past Medical History:  Diagnosis Date  . Anxiety and depression   . Breast cancer ([facility] [facility]) 2015   LT LUMPECTOMY  . Carpal tunnel syndrome   . Chest pain    SECONDARY TO GERD  . Chronic interstitial cystitis   . Chronic right hip pain   . Depression   . Diabetes mellitus without complication ([facility])   . Diverticulosis   . Dyslipidemia   . Essential hypertension, benign   . Fibromyalgia   . Frequent falls   . GERD (gastroesophageal reflux disease)   . Hearing loss of left ear   . History of fractured rib   . History of TIA (transient ischemic attack)   . Irritable bowel   . MS (multiple sclerosis) ([place])   . Osteoarthritis   . Paroxysmal supraventricular tachycardia ([facility])   . Peripheral neuropathy ([facility])  MILD  . Personal history of radiation therapy 2015   BREAST CA  . Protein malnutrition ([facility])   . Pure hypercholesterolemia   . Recurrent UTI   . Skin cancer   . SOB (shortness of breath)    SECONDARY TO REFLUX  . Solitary pulmonary nodule on lung CT 12/04/2015   3 mm LLL lung nodule June 2016, March 2017  . Tachycardia, paroxysmal ([facility])    Reportedly Paroxysmal Supraventricular Tachycardia, but unable to find documentation confirming this  . Thyroid nodule   . Vertigo   . Vitamin B 12 deficiency     Surgical History: Past Surgical History:  Procedure Laterality  Date  . APPENDECTOMY    . AUGMENTATION MAMMAPLASTY Right 1975   BREAST BREAST ONLY  . BLADDER TACKING     X 3  . BREAST LUMPECTOMY    . BREAST SURGERY    . CARDIAC CATHETERIZATION  10/2001   Normal coronary arteries with the exception of 20% proximal D1  . CHOLECYSTECTOMY    . COLONOSCOPY WITH PROPOFOL N/A 01/21/2015   Procedure: COLONOSCOPY WITH PROPOFOL;  Surgeon: Josefine Class, MD;  Location: [facility] ENDOSCOPY;  Service: Endoscopy;  Laterality: N/A;  . ESOPHAGOGASTRODUODENOSCOPY N/A 01/21/2015   Procedure: ESOPHAGOGASTRODUODENOSCOPY (EGD);  Surgeon: Josefine Class, MD;  Location: [facility] ENDOSCOPY;  Service: Endoscopy;  Laterality: N/A;  . EYE SURGERIES     WITH BUCKLE DETACHMENT OF THE RETINA  . heart monitor     Not being used... Patient can not have a MRI  . HEMORRHOID SURGERY     WITH RECONSTRUCTION  . MASTECTOMY Right 1970   SUBCUTANEOUS - NO BREAST CANCER  . TONSILLECTOMY    . TOTAL ABDOMINAL HYSTERECTOMY W/ BILATERAL SALPINGOOPHORECTOMY    . TYMPANOPLASTY      Home Medications:  Allergies as of 08/09/2016      Reactions   Aricept [donepezil Hcl] Other (See Comments)   hallucinations   Biaxin [clarithromycin] Other (See Comments), Diarrhea   Copaxone [glatiramer Acetate]    Decadron [dexamethasone] Nausea And Vomiting, Other (See Comments)   Other reaction(s): Muscle Pain Reaction:  Abdominal pain   Dexamethasone Sodium Phosphate Other (See Comments)   Diltiazem Hcl Other (See Comments)   Reaction:  Unknown    Diltiazem Hcl Other (See Comments)   Flagyl [metronidazole] Nausea Only, Diarrhea   Other reaction(s): Vomiting   Gabapentin Other (See Comments)   "burning all over" Reaction:  Unknown    Iohexol Swelling, Other (See Comments)    Desc: tongue swelling with "IVP dye" sccording to nurses notes with a lumbar myelo-12/09- asm, Onset Date: [id number]  Pts tongue swells.   Ivp Dye [iodinated Diagnostic Agents] Swelling, Other (See Comments)   Passed out    Pts tongue swells.    Levothyroxine Nausea Only   Sulfa Antibiotics Other (See Comments)   Reaction:  Unknown    Sulfonamide Derivatives Other (See Comments)   Reaction:  Unknown    Synthroid [levothyroxine Sodium]    Copaxone  [glatiramer] Rash   Interferon Beta-1a Other (See Comments), Rash   Reaction:  Unknown       Medication List       Accurate as of 08/09/16  4:03 PM. Always use your most recent med list.          acetaminophen 500 MG tablet Commonly known as:  TYLENOL Take by mouth.   aspirin EC 81 MG tablet Take 1 tablet (81 mg total) by mouth daily.   atorvastatin 20 MG tablet  Commonly known as:  LIPITOR Take 1 tablet (20 mg total) by mouth at bedtime.   b complex vitamins tablet Take 1 tablet by mouth daily.   diazepam 5 MG tablet Commonly known as:  VALIUM Take one tablet 30 minutes prior to MRI   dicyclomine 10 MG capsule Commonly known as:  BENTYL Take by mouth.   erythromycin ophthalmic ointment   ESTRACE VAGINAL 0.1 MG/GM vaginal cream Generic drug:  estradiol Place 1 Applicatorful vaginally at bedtime.   ferrous sulfate 325 (65 FE) MG tablet Take 1 tablet (325 mg total) by mouth daily with breakfast.   fluticasone 50 MCG/ACT nasal spray Commonly known as:  FLONASE SPRAY 2 SPRAYS EACH INTO BOTH NOSTRILS ONCE DAILY EACH NIGHT.   GAS-X EXTRA STRENGTH 125 MG Caps Generic drug:  Simethicone Take 1 capsule by mouth as needed. Reported on 11/24/2015   GENTEAL OP Apply 1 drop to eye. Each eye as needed   [place] Rolling walker; knee sprain, arthritis; LON 99 months   hydrocortisone 25 MG suppository Commonly known as:  ANUSOL-HC Place 1 suppository (25 mg total) rectally 2 (two) times daily.   levofloxacin 250 MG tablet Commonly known as:  LEVAQUIN Take 1 tablet (250 mg total) by mouth daily.   loperamide 2 MG tablet Commonly known as:  IMODIUM A-D Take 2 mg by mouth 4 (four) times daily as needed for diarrhea or  loose stools.   meclizine 25 MG tablet Commonly known as:  ANTIVERT Take 1 tablet (25 mg total) by mouth every 8 (eight) hours as needed for dizziness.   memantine tablet pack Commonly known as:  NAMENDA TITRATION PAK Titration pack, take as directed   metFORMIN 500 MG tablet Commonly known as:  GLUCOPHAGE Take 1 tablet (500 mg total) by mouth 2 (two) times daily with a meal.   MYRBETRIQ 25 MG Tb24 tablet Generic drug:  mirabegron ER Take 25 mg by mouth daily.   neomycin-polymyxin-hydrocortisone 3.5-10000-1 otic suspension Commonly known as:  CORTISPORIN Reported on 01/05/2016   nitrofurantoin (macrocrystal-monohydrate) 100 MG capsule Commonly known as:  MACROBID Take 1 capsule (100 mg total) by mouth every 12 (twelve) hours.   nystatin cream Commonly known as:  MYCOSTATIN Apply 1 application topically 2 (two) times daily.   omeprazole 40 MG capsule Commonly known as:  PRILOSEC Take 40 mg by mouth daily.   ondansetron 4 MG tablet Commonly known as:  ZOFRAN Take 4 mg by mouth every 8 (eight) hours as needed for nausea or vomiting. Reported on 12/22/2015   polyethylene glycol powder powder Commonly known as:  GLYCOLAX/MIRALAX Take by mouth.   PROBIOTIC COLON SUPPORT Caps Take 1 capsule by mouth daily.   sertraline 50 MG tablet Commonly known as:  ZOLOFT Take 1 tablet (50 mg total) by mouth daily.   Vitamin B-12 1000 MCG Subl   Vitamin D3 50000 units Caps Take 5,000 Int'l Units/day by mouth once a week. Reported on 01/25/2016       Allergies:  Allergies  Allergen Reactions  . Aricept [Donepezil Hcl] Other (See Comments)    hallucinations  . Biaxin [Clarithromycin] Other (See Comments) and Diarrhea  . Copaxone [Glatiramer Acetate]   . Decadron [Dexamethasone] Nausea And Vomiting and Other (See Comments)    Other reaction(s): Muscle Pain Reaction:  Abdominal pain  . Dexamethasone Sodium Phosphate Other (See Comments)  . Diltiazem Hcl Other (See Comments)     Reaction:  Unknown   . Diltiazem Hcl Other (See Comments)  .  Flagyl [Metronidazole] Nausea Only and Diarrhea    Other reaction(s): Vomiting  . Gabapentin Other (See Comments)    "burning all over" Reaction:  Unknown   . Iohexol Swelling and Other (See Comments)     Desc: tongue swelling with "IVP dye" sccording to nurses notes with a lumbar myelo-12/09- asm, Onset Date: [id number]  Pts tongue swells.  Clementeen Hoof [Iodinated Diagnostic Agents] Swelling and Other (See Comments)    Passed out  Pts tongue swells.   . Levothyroxine Nausea Only  . Sulfa Antibiotics Other (See Comments)    Reaction:  Unknown   . Sulfonamide Derivatives Other (See Comments)    Reaction:  Unknown   . Synthroid [Levothyroxine Sodium]   . Copaxone  [Glatiramer] Rash  . Interferon Beta-1a Other (See Comments) and Rash    Reaction:  Unknown     Family History: Family History  Problem Relation Age of Onset  . ALS Mother   . Kidney disease Sister   . Arthritis Sister   . Aneurysm Brother   . Heart disease Daughter   . Colon cancer    . Breast cancer Neg Hx   . Bladder Cancer Neg Hx   . Prostate cancer Neg Hx     Social History:  reports that she has never smoked. She has never used smokeless tobacco. She reports that she does not drink alcohol or use drugs.  ROS: UROLOGY Frequent Urination?: Yes Hard to postpone urination?: Yes Burning/pain with urination?: Yes Get up at night to urinate?: Yes Leakage of urine?: Yes Urine stream starts and stops?: No Trouble starting stream?: No Do you have to strain to urinate?: No Blood in urine?: No Urinary tract infection?: No Sexually transmitted disease?: No Injury to kidneys or bladder?: No Painful intercourse?: No Weak stream?: No Currently pregnant?: No Vaginal bleeding?: No Last menstrual period?: n  Gastrointestinal Nausea?: No Vomiting?: No Indigestion/heartburn?: Yes Diarrhea?: Yes Constipation?: Yes  Constitutional Fever: No Night  sweats?: Yes Weight loss?: No Fatigue?: Yes  Skin Skin rash/lesions?: No Itching?: No  Eyes Blurred vision?: Yes Double vision?: No  Ears/Nose/Throat Sore throat?: Yes Sinus problems?: Yes  Hematologic/Lymphatic Swollen glands?: No Easy bruising?: No  Cardiovascular Leg swelling?: Yes Chest pain?: No  Respiratory Cough?: No Shortness of breath?: No  Endocrine Excessive thirst?: No  Musculoskeletal Back pain?: Yes Joint pain?: Yes  Neurological Headaches?: Yes Dizziness?: Yes  Psychologic Depression?: Yes Anxiety?: Yes  Physical Exam: Blood pressure (!) 166/84, pulse 71, height 5' (1.524 m), weight 155 lb 11.2 oz (70.6 kg). Constitutional: Well nourished. Alert and oriented, No acute distress. HEENT: [place] AT, moist mucus membranes. Trachea midline, no masses. Cardiovascular: No clubbing, cyanosis, or edema. Respiratory: Normal respiratory effort, no increased work of breathing. GI: Abdomen is soft, non tender, non distended, no abdominal masses.  GU: No CVA tenderness.  No bladder fullness or masses.   Skin: No rashes, bruises or suspicious lesions. Lymph: No cervical or inguinal adenopathy. Neurologic: Grossly intact, no focal deficits, moving all 4 extremities. Psychiatric: Normal mood and affect.  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 Transabdominal technique was performed for global imaging of the pelvis including uterus, ovaries,  adnexal regions, and pelvic cul-de-sac. It was necessary to proceed with endovaginal exam following the transabdominal exam to visualize the urethra.  COMPARISON:  CT of the abdomen and pelvis 07/02/2016  FINDINGS: Uterus  Post hysterectomy  Evaluation of the urethra demonstrates soft tissue thickening of the proximal ureter measuring 2.0 x 1.5 x 1.8 cm. Coarse calcifications are seen inferior to the bladder, which may or may not be associated with the urethra.  Other findings  No abnormal free fluid.  Normal appearance of the urinary bladder.  IMPRESSION: Soft tissue thickening of the proximal urethra measuring up to 2 cm. This may represent urethral diverticulum with inspissated debris versus true soft tissue mass.   Electronically Signed   By: Fidela Salisbury M.D.   On: 07/27/2016 15:53  Assessment & Plan:    1. Interstitial cystitis  - Patient's rescue installation is performed today.    - She does want to continue the instillations at this time.     - She will return next week for her next treatment.    2. Recurrent UTI's  - urine culture sent for culture  - will start suppressive antibiotic therapy at this visit due to diverticulum- Macrobid 100 mg daily    3. Microscopic hematuria  - Work up completed in 2017.  No malignancies discovered.    - UA checked yearly.    - No recent gross hematuria.   - Possible urethral diverticulum seen on CT performed on 07/02/2016  - US demonstrates a possible urethra diverticulum  4. Falling  - Continue to assist the patient with transfers to table   5. Vaginal atrophy  - patient will continue the vaginal estrogen cream nightly for one more week, then switch to three nights weekly  - patient given vaginal estrogen cream sample at today's visit (Estrace)  6. Incontinence  - patient currently taking Myrbetriq 25 mg daily  7. Pelvic pain  - schedule MRI to evaluate for urethral diverticulum- patient's  daughter contacted the office and patient cannot have MRI due to the fact she has a non functional pacemaker still implanted  - will need to pursue a transvaginal ultrasound for further evaluation for the diverticulum  - patient given Percocet 2.5/375, # 10  - ultrasound is suspicious for diverticulum- will speak with Dr. Matilde Sprang regarding our next steps  Return in about 1 week (around 08/16/2016) for rescue solution.  Zara Council, [facility] [address], [place], [place] [address] [phone]

## 2016-08-09 NOTE — Progress Notes (Signed)
Bladder Rescue Solution Instillation  Due to IC patient is present today for a Rescue Solution Treatment.  Patient was cleaned and prepped in a sterile fashion with betadine and lidocaine 2% jelly was instilled into the urethra.  A 14 FR catheter was inserted, urine return was noted 88ml, urine was yellow in color.  Instilled a solution consisting of 38ml of Sodium Bicarb, 2 ml Lidocaine and 1 ml of Heparin. The catheter was then removed. Patient tolerated well, no complications were noted.   Performed by: Toniann Fail, LPN

## 2016-08-09 NOTE — Addendum Note (Signed)
Addended by: Zara Council A on: 08/09/2016 04:20 PM   Modules accepted: Orders

## 2016-08-12 LAB — CULTURE, URINE COMPREHENSIVE

## 2016-08-13 ENCOUNTER — Encounter: Payer: Self-pay | Admitting: *Deleted

## 2016-08-13 ENCOUNTER — Telehealth: Payer: Self-pay

## 2016-08-13 ENCOUNTER — Other Ambulatory Visit: Payer: Self-pay | Admitting: *Deleted

## 2016-08-13 DIAGNOSIS — N390 Urinary tract infection, site not specified: Secondary | ICD-10-CM

## 2016-08-13 MED ORDER — SULFAMETHOXAZOLE-TRIMETHOPRIM 800-160 MG PO TABS: 1.0000 | ORAL_TABLET | Freq: Two times a day (BID) | ORAL | 0 refills | Status: AC

## 2016-08-13 NOTE — Telephone Encounter (Signed)
-----   Message from Nori Riis, PA-C sent at 08/12/2016  5:38 PM EST ----- Patient has a +UCx.  She will need to stop the nitrofurantoin, then she needs to start Septra DS,  one tablet twice daily for seven days and then resume the nitrofurantoin 100 mg daily.  They also need to take a probiotic with the antibiotic course.  The dosage is listed below:  L. acidophilus and L. casei (25 x 109 CFU/day for 2 days, then 50 x 109 CFU/day for duration of the antibiotic course)

## 2016-08-13 NOTE — Telephone Encounter (Signed)
Spoke with pt and made aware of +ucx and needing to take an abx. Made pt aware to stop nitrofurantoin. Pt stated that she is currently not taking nitrofurantoin. Then made pt aware septra ds was sent to her pharmacy. Pt was becoming confused with medications. Therefore made pt aware to take treating abx and will discuss with Larene Beach what to do next. Pt voiced understanding.

## 2016-08-13 NOTE — Patient Outreach (Signed)
[facility] Care Management  [facility] Social Work  08/13/2016  [name] 1929/10/24 [id number]  Subjective:  Patient is a 83 year old female, states that she has fallen twice in the last 2 months. Tripped and fell over the vacuum cleaner cord and once fell over the clothes basket. Patient states that  She had a pelvic ultrasound examination on 07/27/16.. Follow up appointment scheduled for 08/15/16. Patient states that her  daughter now lives in the same building and assist patient with grocery shopping and running errands. Patient states that she continues to receive Meals on Wheels and remains on the waiting list for an independent living facility in [place]. Patient states that she is fine with her living situation now until her name comes up on the list.  Per patient , she completed the eligibility process for the hearing aid through the Department of Health and Human Services, Division of the Deaf and Hard of Hearing will have to wait the estimated  90 days to receive an approval.  Objective:   Encounter Medications:  Outpatient Encounter Prescriptions as of 08/13/2016  Medication Sig Note  . acetaminophen (TYLENOL) 500 MG tablet Take by mouth. 12/29/2015: Received from: [place]  . aspirin EC 81 MG tablet Take 1 tablet (81 mg total) by mouth daily.   Marland Kitchen atorvastatin (LIPITOR) 20 MG tablet Take 1 tablet (20 mg total) by mouth at bedtime.   Marland Kitchen b complex vitamins tablet Take 1 tablet by mouth daily.   . Carboxymethylcell-Hypromellose (GENTEAL OP) Apply 1 drop to eye. Each eye as needed   . Cholecalciferol (VITAMIN D3) 50000 units CAPS Take 5,000 Int'l Units/day by mouth once a week. Reported on 01/25/2016   . Cyanocobalamin (VITAMIN B-12) 1000 MCG SUBL  03/16/2016: Received from: [facility]  . diazepam (VALIUM) 5 MG tablet Take one tablet 30 minutes prior to MRI (Patient not taking: Reported on 08/09/2016)   . dicyclomine (BENTYL) 10 MG  capsule Take by mouth. 12/29/2015: Received from: [facility]  . erythromycin ophthalmic ointment  03/16/2016: Received from: [facility]  . estradiol (ESTRACE VAGINAL) 0.1 MG/GM vaginal cream Place 1 Applicatorful vaginally at bedtime.   . ferrous sulfate 325 (65 FE) MG tablet Take 1 tablet (325 mg total) by mouth daily with breakfast.   . fluticasone (FLONASE) 50 MCG/ACT nasal spray SPRAY 2 SPRAYS EACH INTO BOTH NOSTRILS ONCE DAILY EACH NIGHT. 10/24/2015: Received from: External Pharmacy  . hydrocortisone (ANUSOL-HC) 25 MG suppository Place 1 suppository (25 mg total) rectally 2 (two) times daily. 07/12/2016: PRN  . levofloxacin (LEVAQUIN) 250 MG tablet Take 1 tablet (250 mg total) by mouth daily. (Patient not taking: Reported on 08/09/2016)   . loperamide (IMODIUM A-D) 2 MG tablet Take 2 mg by mouth 4 (four) times daily as needed for diarrhea or loose stools.   . meclizine (ANTIVERT) 25 MG tablet Take 1 tablet (25 mg total) by mouth every 8 (eight) hours as needed for dizziness.   . memantine (NAMENDA TITRATION PAK) tablet pack Titration pack, take as directed   . metFORMIN (GLUCOPHAGE) 500 MG tablet Take 1 tablet (500 mg total) by mouth 2 (two) times daily with a meal. (Patient taking differently: Take 500 mg by mouth daily. )   . mirabegron ER (MYRBETRIQ) 25 MG TB24 tablet Take 25 mg by mouth daily.   . Misc. Devices ([facility]) MISC Rolling walker; knee sprain, arthritis; LON 99 months   .  neomycin-polymyxin-hydrocortisone (CORTISPORIN) 3.5-10000-1 otic suspension Reported on 01/05/2016 12/22/2015: Received from: External Pharmacy  . nitrofurantoin, macrocrystal-monohydrate, (MACROBID) 100 MG capsule Take 1 capsule (100 mg total) by mouth every 12 (twelve) hours.   Marland Kitchen nystatin cream (MYCOSTATIN) Apply 1 application topically 2 (two) times daily.   Marland Kitchen omeprazole (PRILOSEC) 40 MG capsule Take 40 mg by mouth daily.  11/30/2015: Received from: External  Pharmacy  . ondansetron (ZOFRAN) 4 MG tablet Take 4 mg by mouth every 8 (eight) hours as needed for nausea or vomiting. Reported on 12/22/2015 01/12/2016: PRN  . oxycodone-acetaminophen (PERCOCET) 2.5-325 MG tablet Take 1 tablet by mouth every 4 (four) hours as needed for pain.   . polyethylene glycol powder (GLYCOLAX/MIRALAX) powder Take by mouth. 05/09/2016: Received from: [place]: Take 17 g by mouth once daily. Mix in 4-8ounces of fluid prior to taking.  . Probiotic Product (PROBIOTIC COLON SUPPORT) CAPS Take 1 capsule by mouth daily.  08/15/2015: Takes 1 capsule by mouth daily at night  . sertraline (ZOLOFT) 50 MG tablet Take 1 tablet (50 mg total) by mouth daily.   . Simethicone (GAS-X EXTRA STRENGTH) 125 MG CAPS Take 1 capsule by mouth as needed. Reported on 11/24/2015   . sulfamethoxazole-trimethoprim (BACTRIM DS,SEPTRA DS) 800-160 MG tablet Take 1 tablet by mouth 2 (two) times daily.    No facility-administered encounter medications on file as of 08/13/2016.     Functional Status:  In your present state of health, do you have any difficulty performing the following activities: 07/31/2016 07/03/2016  Hearing? [name]  Vision? Y Y  Difficulty concentrating or making decisions? [name]  Walking or climbing stairs? Y Y  Dressing or bathing? Y N  Doing errands, shopping? [name]  Preparing Food and eating ? - Y  Using the Toilet? - N  In the past six months, have you accidently leaked urine? - Y  Do you have problems with loss of bowel control? - N  Managing your Medications? - N  Managing your Finances? - N  Housekeeping or managing your Housekeeping? - Y  Some recent data might be hidden    Fall/Depression Screening:  PHQ 2/9 Scores 07/31/2016 07/03/2016 06/25/2016 05/14/2016 05/09/2016 05/04/2016 04/10/2016  PHQ - 2 Score 0 1 1 1  0 1 0  PHQ- 9 Score - - - - - - -    Assessment: Patient  educated on fall prevention. Emphasized using walker at all times and keeping  apartment floor clear to avoid falls. Patient's daughter lives in the same apartment complex and is helpful with grocery shopping and getting patient to medical appointments.  Patient and her spouse receive Meals on Wheels.  Eligibility process for hearing aid complete, authorization will take up to 90 days.  Patient verbalized having no additional social work needs at this time.      Plan: Patient to be closed to social work at this time. Patient's provider and [place] to be notified of case closure.    [name] [facility] Care Management [phone]

## 2016-08-14 ENCOUNTER — Telehealth: Payer: Self-pay

## 2016-08-14 NOTE — Telephone Encounter (Signed)
Called to pt to reschedule IC cocktail appt due to infection. Pt stated that she was unaware of infection and abx. Nurse spoke with pt and reinforced with her that we spoke yesterday and made her aware of +ucx and abx. Pt voiced understanding stating she will pick up medication tonight.

## 2016-08-15 ENCOUNTER — Ambulatory Visit: Payer: Medicare HMO

## 2016-08-22 ENCOUNTER — Ambulatory Visit: Payer: Medicare HMO

## 2016-08-23 ENCOUNTER — Ambulatory Visit: Payer: Medicare HMO

## 2016-08-28 ENCOUNTER — Other Ambulatory Visit: Payer: Self-pay | Admitting: *Deleted

## 2016-08-28 ENCOUNTER — Emergency Department: Payer: Medicare HMO

## 2016-08-28 ENCOUNTER — Encounter: Payer: Self-pay | Admitting: Emergency Medicine

## 2016-08-28 ENCOUNTER — Telehealth: Payer: Self-pay

## 2016-08-28 ENCOUNTER — Emergency Department
Admission: EM | Admit: 2016-08-28 | Discharge: 2016-08-28 | Disposition: A | Discharge status: Home / Self Care | Payer: Medicare HMO | Attending: Emergency Medicine | Admitting: Emergency Medicine

## 2016-08-28 ENCOUNTER — Encounter: Payer: Self-pay | Admitting: Urology

## 2016-08-28 ENCOUNTER — Ambulatory Visit (INDEPENDENT_AMBULATORY_CARE_PROVIDER_SITE_OTHER): Payer: Medicare HMO | Admitting: Urology

## 2016-08-28 VITALS — BP 135/84 | HR 74 | Ht 63.0 in | Wt 149.0 lb

## 2016-08-28 DIAGNOSIS — N361 Urethral diverticulum: Secondary | ICD-10-CM | POA: Diagnosis not present

## 2016-08-28 DIAGNOSIS — R1110 Vomiting, unspecified: Secondary | ICD-10-CM

## 2016-08-28 DIAGNOSIS — N390 Urinary tract infection, site not specified: Secondary | ICD-10-CM

## 2016-08-28 DIAGNOSIS — Z853 Personal history of malignant neoplasm of breast: Secondary | ICD-10-CM | POA: Diagnosis not present

## 2016-08-28 DIAGNOSIS — R1030 Lower abdominal pain, unspecified: Secondary | ICD-10-CM

## 2016-08-28 DIAGNOSIS — R3129 Other microscopic hematuria: Secondary | ICD-10-CM | POA: Diagnosis not present

## 2016-08-28 DIAGNOSIS — Z79899 Other long term (current) drug therapy: Secondary | ICD-10-CM | POA: Insufficient documentation

## 2016-08-28 DIAGNOSIS — R296 Repeated falls: Secondary | ICD-10-CM | POA: Diagnosis not present

## 2016-08-28 DIAGNOSIS — R197 Diarrhea, unspecified: Secondary | ICD-10-CM | POA: Diagnosis not present

## 2016-08-28 DIAGNOSIS — N952 Postmenopausal atrophic vaginitis: Secondary | ICD-10-CM

## 2016-08-28 DIAGNOSIS — I10 Essential (primary) hypertension: Secondary | ICD-10-CM | POA: Diagnosis not present

## 2016-08-28 DIAGNOSIS — Z7984 Long term (current) use of oral hypoglycemic drugs: Secondary | ICD-10-CM | POA: Diagnosis not present

## 2016-08-28 DIAGNOSIS — Z7982 Long term (current) use of aspirin: Secondary | ICD-10-CM | POA: Insufficient documentation

## 2016-08-28 DIAGNOSIS — R32 Unspecified urinary incontinence: Secondary | ICD-10-CM

## 2016-08-28 DIAGNOSIS — R1111 Vomiting without nausea: Secondary | ICD-10-CM | POA: Diagnosis not present

## 2016-08-28 DIAGNOSIS — E119 Type 2 diabetes mellitus without complications: Secondary | ICD-10-CM | POA: Insufficient documentation

## 2016-08-28 DIAGNOSIS — N3010 Interstitial cystitis (chronic) without hematuria: Secondary | ICD-10-CM | POA: Diagnosis not present

## 2016-08-28 LAB — URINALYSIS, COMPLETE
BILIRUBIN UA: NEGATIVE
GLUCOSE, UA: NEGATIVE
NITRITE UA: NEGATIVE
Specific Gravity, UA: 1.025 (ref 1.005–1.030)
UUROB: 0.2 mg/dL (ref 0.2–1.0)
pH, UA: 5.5 (ref 5.0–7.5)

## 2016-08-28 LAB — URINALYSIS, COMPLETE (UACMP) WITH MICROSCOPIC
BILIRUBIN URINE: NEGATIVE
Bacteria, UA: NONE SEEN
Glucose, UA: NEGATIVE mg/dL
Hgb urine dipstick: NEGATIVE
KETONES UR: 5 mg/dL — AB
Nitrite: NEGATIVE
PH: 5 (ref 5.0–8.0)
PROTEIN: 30 mg/dL — AB
Specific Gravity, Urine: 1.024 (ref 1.005–1.030)

## 2016-08-28 LAB — CBC
HEMATOCRIT: 40.5 % (ref 35.0–47.0)
HEMOGLOBIN: 13.9 g/dL (ref 12.0–16.0)
MCH: 31.4 pg (ref 26.0–34.0)
MCHC: 34.4 g/dL (ref 32.0–36.0)
MCV: 91.4 fL (ref 80.0–100.0)
PLATELETS: 196 10*3/uL (ref 150–440)
RBC: 4.44 MIL/uL (ref 3.80–5.20)
RDW: 14.2 % (ref 11.5–14.5)
WBC: 4.1 10*3/uL (ref 3.6–11.0)

## 2016-08-28 LAB — COMPREHENSIVE METABOLIC PANEL
ALT: 22 U/L (ref 14–54)
ANION GAP: 7 (ref 5–15)
AST: 17 U/L (ref 15–41)
Albumin: 4.2 g/dL (ref 3.5–5.0)
Alkaline Phosphatase: 43 U/L (ref 38–126)
BUN: 25 mg/dL — ABNORMAL HIGH (ref 6–20)
CHLORIDE: 106 mmol/L (ref 101–111)
CO2: 26 mmol/L (ref 22–32)
Calcium: 9.2 mg/dL (ref 8.9–10.3)
Creatinine, Ser: 1.08 mg/dL — ABNORMAL HIGH (ref 0.44–1.00)
GFR, EST AFRICAN AMERICAN: 53 mL/min — AB (ref >60)
GFR, EST NON AFRICAN AMERICAN: 46 mL/min — AB (ref >60)
Glucose, Bld: 118 mg/dL — ABNORMAL HIGH (ref 65–99)
POTASSIUM: 4.1 mmol/L (ref 3.5–5.1)
SODIUM: 139 mmol/L (ref 135–145)
Total Bilirubin: 0.7 mg/dL (ref 0.3–1.2)
Total Protein: 7.6 g/dL (ref 6.5–8.1)

## 2016-08-28 LAB — MICROSCOPIC EXAMINATION: Epithelial Cells (non renal): >10 /hpf — AB (ref 0–10)

## 2016-08-28 LAB — TROPONIN I: Troponin I: <0.03 ng/mL (ref <0.03)

## 2016-08-28 LAB — LIPASE, BLOOD: LIPASE: 20 U/L (ref 11–51)

## 2016-08-28 MED ORDER — TRAMADOL HCL 50 MG PO TABS: 50.0000 mg | ORAL_TABLET | Freq: Four times a day (QID) | ORAL | 0 refills | Status: DC | PRN

## 2016-08-28 MED ORDER — NITROFURANTOIN MONOHYD MACRO 100 MG PO CAPS: 100.0000 mg | ORAL_CAPSULE | Freq: Two times a day (BID) | ORAL | 0 refills | Status: AC

## 2016-08-28 MED ORDER — HYDROMORPHONE HCL 1 MG/ML IJ SOLN
0.5000 mg | Freq: Once | INTRAMUSCULAR | Status: AC
  Administered 2016-08-28: 0.5 mg via INTRAVENOUS
  Filled 2016-08-28: qty 1

## 2016-08-28 MED ORDER — DIPHENHYDRAMINE HCL 50 MG/ML IJ SOLN
25.0000 mg | Freq: Once | INTRAMUSCULAR | Status: AC
Start: 2016-08-28 — End: 2016-08-28
  Administered 2016-08-28: 25 mg via INTRAVENOUS

## 2016-08-28 MED ORDER — ONDANSETRON HCL 4 MG/2ML IJ SOLN
4.0000 mg | Freq: Once | INTRAMUSCULAR | Status: AC
  Administered 2016-08-28: 4 mg via INTRAVENOUS
  Filled 2016-08-28: qty 2

## 2016-08-28 MED ORDER — DIPHENHYDRAMINE HCL 50 MG/ML IJ SOLN
INTRAMUSCULAR | Status: AC
  Filled 2016-08-28: qty 1

## 2016-08-28 MED ORDER — SODIUM CHLORIDE 0.9 % IV BOLUS (SEPSIS)
250.0000 mL | Freq: Once | INTRAVENOUS | Status: AC
  Administered 2016-08-28: 250 mL via INTRAVENOUS

## 2016-08-28 NOTE — Progress Notes (Signed)
10:01 AM   [name] April 12, 1930 [id number]  Referring provider: Arnetha Courser, MD [address] [place], [place] [address]  Chief Complaint  Patient presents with  . Follow-up    rescue solution     HPI: Patient is an 83 year old Caucasian female with a history of IC, fall risk, a history of hematuria, incontinence and a history of recurrent UTI's who presents today for a rescue installation.     Fall risk She continues to have falling accidents.    History of hematuria Patient had a CT abdomen and pelvis wo contrast on 08/03/2015 which noted a stable hyperdense cyst in the left kidney.  A cystoscopy on 01/04/2016 which was negative.   She denies any gross hematuria.  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  She could not undergo a MRI of this area due to having a non-functioning pacemaker in place.  She underwent a pelvic ultrasound demonstrated a soft tissue thickening of the proximal urethra measuring up to 2 cm.  This may represent urethral diverticulum with inspissated debris versus true soft tissue mass.  I have independently reviewed the films.  This may be the cause of her hematuria.    History of recurrent UTI's Patient has had greater than > 4 documented UTI's over the last year.  She has had multiple organisms with variable resistant pattern.  She is applying the vaginal estrogen cream 3 nights weekly.   She is also taking a probiotic.  She is having pelvic pain and dysuria.  She also has findings on CT and pelvic ultrasound suspicious for a urethral diverticulum.  UA today is nitrite negative with 6-10 WBC's.   She has recently been placed on nitrofurantoin suppressive therapy for the diverticulum.  She states she has been having two days of vomiting and diarrhea.  She has been having lower back pain since the pelvic ultrasound and now she is having pain just below her ribs bilaterally.  She  states this pain began over the weekend and she attributes this to constant vomiting.      Interstitial cystitis Patient presents today for a rescue solution.  She feels she is having improvement with the instillations.  She is complaining of frequency, urgency, dysuria, nocturia and incontinence.  She gets relief of her bladder pain for a few days after the solution is given.  She may have an urethral diverticulum,  ultrasound completed.    Incontinence Patient currently on Myrbetriq 25 mg daily.  She feels they are providing benefit.    Urethral diverticulum Patient has been complaining of pelvic pain for 2 months.  She recently underwent a pelvic CT and there were findings suspicious for an urethral diverticulum.   Unchanged focal density at the level of the proximal urethra favoring calcium/urolithiasis within diverticulum, less likely colovesicular fistula given stability.  Diverticulosis without acute diverticulitis.  She could not have a MRI due to an implanted non functional pacemaker.  She underwent a pelvic ultrasound demonstrated a soft tissue thickening of the proximal urethra measuring up to 2 cm.  This may represent urethral diverticulum with inspissated debris versus true soft tissue mass.  I have discussed her findings with Dr. Pilar Jarvis and Dr. Matilde Sprang.  Both have recommended suppressive antibiotics, but did not recommend surgical intervention at this time due to her age and co-morbidities.    PMH: Past Medical History:  Diagnosis Date  . Anxiety  and depression   . Breast cancer ([facility]) 2015   LT LUMPECTOMY  . Carpal tunnel syndrome   . Chest pain    SECONDARY TO GERD  . Chronic interstitial cystitis   . Chronic right hip pain   . Depression   . Diabetes mellitus without complication ([facility])   . Diverticulosis   . Dyslipidemia   . Essential hypertension, benign   . Fibromyalgia   . Frequent falls   . GERD (gastroesophageal reflux disease)   . Hearing loss of left ear   .  History of fractured rib   . History of TIA (transient ischemic attack)   . Irritable bowel   . MS (multiple sclerosis) ([facility])   . Osteoarthritis   . Paroxysmal supraventricular tachycardia ([facility])   . Peripheral neuropathy ([facility])    MILD  . Personal history of radiation therapy 2015   BREAST CA  . Protein malnutrition ([facility])   . Pure hypercholesterolemia   . Recurrent UTI   . Skin cancer   . SOB (shortness of breath)    SECONDARY TO REFLUX  . Solitary pulmonary nodule on lung CT 12/04/2015   3 mm LLL lung nodule June 2016, March 2017  . Tachycardia, paroxysmal ([facility])    Reportedly Paroxysmal Supraventricular Tachycardia, but unable to find documentation confirming this  . Thyroid nodule   . Vertigo   . Vitamin B 12 deficiency     Surgical History: Past Surgical History:  Procedure Laterality Date  . APPENDECTOMY    . AUGMENTATION MAMMAPLASTY Right 1975   BREAST BREAST ONLY  . BLADDER TACKING     X 3  . BREAST LUMPECTOMY    . BREAST SURGERY    . CARDIAC CATHETERIZATION  10/2001   Normal coronary arteries with the exception of 20% proximal D1  . CHOLECYSTECTOMY    . COLONOSCOPY WITH PROPOFOL N/A 01/21/2015   Procedure: COLONOSCOPY WITH PROPOFOL;  Surgeon: Josefine Class, MD;  Location: [facility] ENDOSCOPY;  Service: Endoscopy;  Laterality: N/A;  . ESOPHAGOGASTRODUODENOSCOPY N/A 01/21/2015   Procedure: ESOPHAGOGASTRODUODENOSCOPY (EGD);  Surgeon: Josefine Class, MD;  Location[facility] ENDOSCOPY;  Service: Endoscopy;  Laterality: N/A;  . EYE SURGERIES     WITH BUCKLE DETACHMENT OF THE RETINA  . heart monitor     Not being used... Patient can not have a MRI  . HEMORRHOID SURGERY     WITH RECONSTRUCTION  . MASTECTOMY Right 1970   SUBCUTANEOUS - NO BREAST CANCER  . TONSILLECTOMY    . TOTAL ABDOMINAL HYSTERECTOMY W/ BILATERAL SALPINGOOPHORECTOMY    . TYMPANOPLASTY      Home Medications:  Allergies as of 08/28/2016      Reactions   Aricept [donepezil Hcl] Other (See Comments)     hallucinations   Biaxin [clarithromycin] Other (See Comments), Diarrhea   Copaxone [glatiramer Acetate]    Decadron [dexamethasone] Nausea And Vomiting, Other (See Comments)   Other reaction(s): Muscle Pain Reaction:  Abdominal pain   Dexamethasone Sodium Phosphate Other (See Comments)   Diltiazem Hcl Other (See Comments)   Reaction:  Unknown    Diltiazem Hcl Other (See Comments)   Flagyl [metronidazole] Nausea Only, Diarrhea   Other reaction(s): Vomiting   Gabapentin Other (See Comments)   "burning all over" Reaction:  Unknown    Iohexol Swelling, Other (See Comments)    Desc: tongue swelling with "IVP dye" sccording to nurses notes with a lumbar myelo-12/09- asm, Onset Date: [id number]  Pts tongue swells.   Ivp Dye [iodinated Diagnostic Agents] Swelling,  Other (See Comments)   Passed out  Pts tongue swells.    Levothyroxine Nausea Only   Sulfa Antibiotics Other (See Comments)   Reaction:  Unknown    Sulfonamide Derivatives Other (See Comments)   Reaction:  Unknown    Synthroid [levothyroxine Sodium]    Copaxone  [glatiramer] Rash   Interferon Beta-1a Other (See Comments), Rash   Reaction:  Unknown       Medication List       Accurate as of 08/28/16 11:59 PM. Always use your most recent med list.          acetaminophen 500 MG tablet Commonly known as:  TYLENOL Take by mouth.   aspirin EC 81 MG tablet Take 1 tablet (81 mg total) by mouth daily.   atorvastatin 20 MG tablet Commonly known as:  LIPITOR Take 1 tablet (20 mg total) by mouth at bedtime.   b complex vitamins tablet Take 1 tablet by mouth daily.   diazepam 5 MG tablet Commonly known as:  VALIUM Take one tablet 30 minutes prior to MRI   dicyclomine 10 MG capsule Commonly known as:  BENTYL Take by mouth.   erythromycin ophthalmic ointment   ESTRACE VAGINAL 0.1 MG/GM vaginal cream Generic drug:  estradiol Place 1 Applicatorful vaginally at bedtime.   ferrous sulfate 325 (65 FE) MG tablet Take  1 tablet (325 mg total) by mouth daily with breakfast.   fluticasone 50 MCG/ACT nasal spray Commonly known as:  FLONASE SPRAY 2 SPRAYS EACH INTO BOTH NOSTRILS ONCE DAILY EACH NIGHT.   GAS-X EXTRA STRENGTH 125 MG Caps Generic drug:  Simethicone Take 1 capsule by mouth as needed. Reported on 11/24/2015   GENTEAL OP Apply 1 drop to eye. Each eye as needed   [place] Rolling walker; knee sprain, arthritis; LON 99 months   hydrocortisone 25 MG suppository Commonly known as:  ANUSOL-HC Place 1 suppository (25 mg total) rectally 2 (two) times daily.   levofloxacin 250 MG tablet Commonly known as:  LEVAQUIN Take 1 tablet (250 mg total) by mouth daily.   loperamide 2 MG tablet Commonly known as:  IMODIUM A-D Take 2 mg by mouth 4 (four) times daily as needed for diarrhea or loose stools.   meclizine 25 MG tablet Commonly known as:  ANTIVERT Take 1 tablet (25 mg total) by mouth every 8 (eight) hours as needed for dizziness.   memantine tablet pack Commonly known as:  NAMENDA TITRATION PAK Titration pack, take as directed   metFORMIN 500 MG tablet Commonly known as:  GLUCOPHAGE Take 1 tablet (500 mg total) by mouth 2 (two) times daily with a meal.   MYRBETRIQ 25 MG Tb24 tablet Generic drug:  mirabegron ER Take 25 mg by mouth daily.   neomycin-polymyxin-hydrocortisone 3.5-10000-1 otic suspension Commonly known as:  CORTISPORIN Reported on 01/05/2016   nitrofurantoin (macrocrystal-monohydrate) 100 MG capsule Commonly known as:  MACROBID Take 1 capsule (100 mg total) by mouth 2 (two) times daily.   nystatin cream Commonly known as:  MYCOSTATIN Apply 1 application topically 2 (two) times daily.   omeprazole 40 MG capsule Commonly known as:  PRILOSEC Take 40 mg by mouth daily.   ondansetron 4 MG tablet Commonly known as:  ZOFRAN Take 4 mg by mouth every 8 (eight) hours as needed for nausea or vomiting. Reported on 12/22/2015   oxycodone-acetaminophen  2.5-325 MG tablet Commonly known as:  PERCOCET Take 1 tablet by mouth every 4 (four) hours as needed for pain.  polyethylene glycol powder powder Commonly known as:  GLYCOLAX/MIRALAX Take by mouth.   PROBIOTIC COLON SUPPORT Caps Take 1 capsule by mouth daily.   sertraline 50 MG tablet Commonly known as:  ZOLOFT Take 1 tablet (50 mg total) by mouth daily.   traMADol 50 MG tablet Commonly known as:  ULTRAM Take 1 tablet (50 mg total) by mouth every 6 (six) hours as needed.   Vitamin B-12 1000 MCG Subl   Vitamin D3 50000 units Caps Take 5,000 Int'l Units/day by mouth once a week. Reported on 01/25/2016       Allergies:  Allergies  Allergen Reactions  . Aricept [Donepezil Hcl] Other (See Comments)    hallucinations  . Biaxin [Clarithromycin] Other (See Comments) and Diarrhea  . Copaxone [Glatiramer Acetate]   . Decadron [Dexamethasone] Nausea And Vomiting and Other (See Comments)    Other reaction(s): Muscle Pain Reaction:  Abdominal pain  . Dexamethasone Sodium Phosphate Other (See Comments)  . Diltiazem Hcl Other (See Comments)    Reaction:  Unknown   . Diltiazem Hcl Other (See Comments)  . Flagyl [Metronidazole] Nausea Only and Diarrhea    Other reaction(s): Vomiting  . Gabapentin Other (See Comments)    "burning all over" Reaction:  Unknown   . Iohexol Swelling and Other (See Comments)     Desc: tongue swelling with "IVP dye" sccording to nurses notes with a lumbar myelo-12/09- asm, Onset Date: [id number]  Pts tongue swells.  Clementeen Hoof [Iodinated Diagnostic Agents] Swelling and Other (See Comments)    Passed out  Pts tongue swells.   . Levothyroxine Nausea Only  . Macrobid [Nitrofurantoin Macrocrystal] Nausea And Vomiting  . Sulfa Antibiotics Other (See Comments)    Reaction:  Unknown   . Sulfonamide Derivatives Other (See Comments)    Reaction:  Unknown   . Synthroid [Levothyroxine Sodium]   . Copaxone  [Glatiramer] Rash  . Interferon Beta-1a Other (See  Comments) and Rash    Reaction:  Unknown     Family History: Family History  Problem Relation Age of Onset  . ALS Mother   . Kidney disease Sister   . Arthritis Sister   . Aneurysm Brother   . Heart disease Daughter   . Colon cancer    . Breast cancer Neg Hx   . Bladder Cancer Neg Hx   . Prostate cancer Neg Hx     Social History:  reports that she has never smoked. She has never used smokeless tobacco. She reports that she does not drink alcohol or use drugs.  ROS: UROLOGY Frequent Urination?: Yes Hard to postpone urination?: Yes Burning/pain with urination?: Yes Get up at night to urinate?: Yes Leakage of urine?: Yes Urine stream starts and stops?: No Trouble starting stream?: No Do you have to strain to urinate?: No Blood in urine?: No Urinary tract infection?: No Sexually transmitted disease?: No Injury to kidneys or bladder?: No Painful intercourse?: No Weak stream?: No Currently pregnant?: No Vaginal bleeding?: No Last menstrual period?: n  Gastrointestinal Nausea?: Yes Vomiting?: Yes Indigestion/heartburn?: Yes Diarrhea?: Yes Constipation?: Yes  Constitutional Fever: No Night sweats?: Yes Weight loss?: Yes Fatigue?: Yes  Skin Skin rash/lesions?: No Itching?: No  Eyes Blurred vision?: Yes Double vision?: No  Ears/Nose/Throat Sore throat?: Yes Sinus problems?: Yes  Hematologic/Lymphatic Swollen glands?: No Easy bruising?: Yes  Cardiovascular Leg swelling?: No Chest pain?: Yes  Respiratory Cough?: No Shortness of breath?: No  Endocrine Excessive thirst?: No  Musculoskeletal Back pain?: Yes Joint pain?: Yes  Neurological Headaches?: Yes Dizziness?: Yes  Psychologic Depression?: Yes Anxiety?: Yes  Physical Exam: Blood pressure 135/84, pulse 74, height 5' 3"  (1.6 m), weight 149 lb (67.6 kg). Constitutional: Well nourished. Alert and oriented, No acute distress. HEENT: [place] AT, moist mucus membranes. Trachea midline, no  masses. Cardiovascular: No clubbing, cyanosis, or edema. Respiratory: Normal respiratory effort, no increased work of breathing. GI: Abdomen is soft, non tender, non distended, no abdominal masses.  GU: No CVA tenderness.  No bladder fullness or masses.   Skin: No rashes, bruises or suspicious lesions. Lymph: No cervical or inguinal adenopathy. Neurologic: Grossly intact, no focal deficits, moving all 4 extremities. Psychiatric: Normal mood and affect.  Laboratory Data: Lab Results  Component Value Date   WBC 4.1 08/28/2016   HGB 13.9 08/28/2016   HCT 40.5 08/28/2016   MCV 91.4 08/28/2016   PLT 196 08/28/2016   Lab Results  Component Value Date   CREATININE 1.08 (H) 08/28/2016   Lab Results  Component Value Date   HGBA1C 6.5 (H) 04/03/2016      Pertinent imaging CLINICAL DATA:  Urethral diverticulum seen on CT 07/02/2016  EXAM: TRANSABDOMINAL AND TRANSVAGINAL ULTRASOUND OF PELVIS  TECHNIQUE: Both transabdominal and transvaginal ultrasound examinations of the pelvis were performed. Transabdominal technique was performed for global imaging of the pelvis including uterus, ovaries, adnexal regions, and pelvic cul-de-sac. It was necessary to proceed with endovaginal exam following the transabdominal exam to visualize the urethra.  COMPARISON:  CT of the abdomen and pelvis 07/02/2016  FINDINGS: Uterus  Post hysterectomy  Evaluation of the urethra demonstrates soft tissue thickening of the proximal ureter measuring 2.0 x 1.5 x 1.8 cm. Coarse calcifications are seen inferior to the bladder, which may or may not be associated with the urethra.  Other findings  No abnormal free fluid.  Normal appearance of the urinary bladder.  IMPRESSION: Soft tissue thickening of the proximal urethra measuring up to 2 cm. This may represent urethral diverticulum with inspissated debris versus true soft tissue mass.   Electronically Signed   By: Fidela Salisbury M.D.   On: 07/27/2016 15:53  Assessment & Plan:    1. Diarrhea and vomiting  - patient was instructed to seek further treatment in the ED as she is elderly and only has an elderly husband at home  2. Interstitial cystitis  - Patient's rescue installation is NOT performed today.    - RTC after diarrhea and vomiting have resolved  3. Recurrent UTI's  - urine culture sent for culture  - on suppressive antibiotic therapy due to diverticulum- Macrobid 100 mg daily    4. Microscopic hematuria  - Work up completed in 2017.  No malignancies discovered.    - UA checked yearly for hematuria  - No recent gross hematuria.   - Possible urethral diverticulum seen on CT performed on 07/02/2016 and pelvic US   5. Falling  - Continue to assist the patient with transfers to table   6. Vaginal atrophy  - patient will continue the vaginal estrogen cream nightly for one more week, then switch to three nights weekly  - patient given vaginal estrogen cream sample at today's visit (Estrace)  7. Incontinence  - patient currently taking Myrbetriq 25 mg daily  8. Urethral diverticulum  - patient cannot have MRI due to the fact she has a non functional pacemaker still implanted  - CT of pelvis and  transvaginal ultrasound is suspicious for an urethral diverticulum  - reviewed patient's case with Dr.  MacDiarmid and Dr. Pilar Jarvis, they do not recommend any surgical intervention at this time and to continue the suppressive antibiotic at this time  Return for instructed to go to the ED.  Zara Council, [facility] [address] [place], [place] [address] [phone]

## 2016-08-28 NOTE — ED Notes (Signed)
Pt reports itching.  meds given.  No resp distress.  Pt sleepy.

## 2016-08-28 NOTE — ED Triage Notes (Signed)
Pt brought over from [facility] with back and abd pain, n/v/d for two days.

## 2016-08-28 NOTE — ED Notes (Signed)
md in with pt again.  Pt drinking water without diff.  Pt alert.  Family with pt.  Iv dc'ed.

## 2016-08-28 NOTE — Telephone Encounter (Signed)
At reprenstative from Urology called regarding the patient. The patient had an office visit today and she told the representative she wasn't feeling to good; Vomiting and real bad dirrehea for the past couple of days. She asked if we had an opening today I mention to her we do not and that the best thing to do is have her seen at the er or urgent care.

## 2016-08-28 NOTE — ED Provider Notes (Signed)
Time Seen: Approximately 2103  I have reviewed the triage notes  Chief Complaint: Abdominal Pain; Emesis; and Diarrhea   History of Present Illness: [name] is a 83 y.o. female *who was referred here from [facility] clinic for evaluation of abdominal pain with nausea vomiting and diarrhea. Patient has a long history of irritable bowel syndrome along with frequent recurrent urinary tract infections. She is not aware of any fever. She tried to follow up with her urologist and has had a recent urine study per ultrasound evaluation. She describes burning and frequency with urination but no hematuria. Patient's had numerous abdominal surgeries and points primarily to the lower middle quadrant as the source of her discomfort.   Past Medical History:  Diagnosis Date  . Anxiety and depression   . Breast cancer ([place]) 2015   LT LUMPECTOMY  . Carpal tunnel syndrome   . Chest pain    SECONDARY TO GERD  . Chronic interstitial cystitis   . Chronic right hip pain   . Depression   . Diabetes mellitus without complication ([place])   . Diverticulosis   . Dyslipidemia   . Essential hypertension, benign   . Fibromyalgia   . Frequent falls   . GERD (gastroesophageal reflux disease)   . Hearing loss of left ear   . History of fractured rib   . History of TIA (transient ischemic attack)   . Irritable bowel   . MS (multiple sclerosis) ([place])   . Osteoarthritis   . Paroxysmal supraventricular tachycardia ([place])   . Peripheral neuropathy (HCC)    MILD  . Personal history of radiation therapy 2015   BREAST CA  . Protein malnutrition ([place])   . Pure hypercholesterolemia   . Recurrent UTI   . Skin cancer   . SOB (shortness of breath)    SECONDARY TO REFLUX  . Solitary pulmonary nodule on lung CT 12/04/2015   3 mm LLL lung nodule June 2016, March 2017  . Tachycardia, paroxysmal (HCC)    Reportedly Paroxysmal Supraventricular Tachycardia, but unable to find documentation confirming this  .  Thyroid nodule   . Vertigo   . Vitamin B 12 deficiency     Patient Active Problem List   Diagnosis Date Noted  . Vertigo, aural, left 07/31/2016  . Rectal pain 06/25/2016  . External hemorrhoid 06/18/2016  . Head injury due to trauma 05/09/2016  . Neoplasm of uncertain behavior of skin 05/09/2016  . Hemorrhoids 04/24/2016  . Dementia 04/24/2016  . Medication monitoring encounter 04/03/2016  . Confusion 04/03/2016  . Gait abnormality 04/03/2016  . Skin lump of leg 04/03/2016  . Pain in right knee 02/15/2016  . Pain and swelling of right knee 02/13/2016  . Right knee sprain 02/13/2016  . Solitary pulmonary nodule on lung CT 12/04/2015  . Abnormal CAT scan 11/21/2015  . Generalized degenerative joint disease of hand 11/21/2015  . Triggering of digit 11/21/2015  . Snapping thumb syndrome 11/21/2015  . Pain, joint, hand 11/11/2015  . Coronary artery calcification seen on CAT scan 11/11/2015  . Multiple sclerosis ([place]) 11/11/2015  . Chronic pain of multiple joints 09/02/2015  . Right lower quadrant abdominal pain 08/02/2015  . Microscopic hematuria 07/12/2015  . Recurrent UTI 04/09/2015  . Carpal tunnel syndrome 01/12/2014  . DD (diverticular disease) 01/12/2014  . Ductal carcinoma in situ of left breast 01/12/2014  . Hypercholesteremia 01/12/2014  . DS (disseminated sclerosis) ([place]) 01/12/2014  . Arthritis, degenerative 01/12/2014  . Thyroid nodule 01/12/2014  . Cyanocobalamine  deficiency (non anemic) 01/12/2014  . Anxiety and depression 01/12/2014  . Intraductal carcinoma of breast 01/12/2014  . History of right mastectomy 01/12/2014  . DM (diabetes mellitus) type II controlled, neurological manifestation ([place]) 06/26/2010  . Depression, major, recurrent, in partial remission ([place]) 06/26/2010  . Frequent falls 06/26/2010  . Stricture and stenosis of esophagus 06/26/2010  . Gastro-esophageal reflux disease without esophagitis 06/26/2010  . Fibromyalgia 06/26/2010  .  Chronic interstitial cystitis 06/26/2010  . H/O malignant neoplasm of breast 06/26/2010  . Hypertension goal BP (blood pressure) < 140/90 06/01/2009  . H/O paroxysmal supraventricular tachycardia 06/01/2009  . Paroxysmal supraventricular tachycardia ([place]) 06/01/2009  . Irritable bowel syndrome with constipation and diarrhea 06/16/2008  . Diarrhea, functional 06/16/2008  . Diarrhea, unspecified 06/16/2008    Past Surgical History:  Procedure Laterality Date  . APPENDECTOMY    . AUGMENTATION MAMMAPLASTY Right 1975   BREAST BREAST ONLY  . BLADDER TACKING     X 3  . BREAST LUMPECTOMY    . BREAST SURGERY    . CARDIAC CATHETERIZATION  10/2001   Normal coronary arteries with the exception of 20% proximal D1  . CHOLECYSTECTOMY    . COLONOSCOPY WITH PROPOFOL N/A 01/21/2015   Procedure: COLONOSCOPY WITH PROPOFOL;  Surgeon: Josefine Class, MD;  Location: [facility] ENDOSCOPY;  Service: Endoscopy;  Laterality: N/A;  . ESOPHAGOGASTRODUODENOSCOPY N/A 01/21/2015   Procedure: ESOPHAGOGASTRODUODENOSCOPY (EGD);  Surgeon: Josefine Class, MD;  Location: [facility] ENDOSCOPY;  Service: Endoscopy;  Laterality: N/A;  . EYE SURGERIES     WITH BUCKLE DETACHMENT OF THE RETINA  . heart monitor     Not being used... Patient can not have a MRI  . HEMORRHOID SURGERY     WITH RECONSTRUCTION  . MASTECTOMY Right 1970   SUBCUTANEOUS - NO BREAST CANCER  . TONSILLECTOMY    . TOTAL ABDOMINAL HYSTERECTOMY W/ BILATERAL SALPINGOOPHORECTOMY    . TYMPANOPLASTY      Past Surgical History:  Procedure Laterality Date  . APPENDECTOMY    . AUGMENTATION MAMMAPLASTY Right 1975   BREAST BREAST ONLY  . BLADDER TACKING     X 3  . BREAST LUMPECTOMY    . BREAST SURGERY    . CARDIAC CATHETERIZATION  10/2001   Normal coronary arteries with the exception of 20% proximal D1  . CHOLECYSTECTOMY    . COLONOSCOPY WITH PROPOFOL N/A 01/21/2015   Procedure: COLONOSCOPY WITH PROPOFOL;  Surgeon: Josefine Class, MD;  Location: [facility]  ENDOSCOPY;  Service: Endoscopy;  Laterality: N/A;  . ESOPHAGOGASTRODUODENOSCOPY N/A 01/21/2015   Procedure: ESOPHAGOGASTRODUODENOSCOPY (EGD);  Surgeon: Josefine Class, MD;  Location: [facility] ENDOSCOPY;  Service: Endoscopy;  Laterality: N/A;  . EYE SURGERIES     WITH BUCKLE DETACHMENT OF THE RETINA  . heart monitor     Not being used... Patient can not have a MRI  . HEMORRHOID SURGERY     WITH RECONSTRUCTION  . MASTECTOMY Right 1970   SUBCUTANEOUS - NO BREAST CANCER  . TONSILLECTOMY    . TOTAL ABDOMINAL HYSTERECTOMY W/ BILATERAL SALPINGOOPHORECTOMY    . TYMPANOPLASTY      Current Outpatient Rx  . Order #: [id number] Class: Historical Med  . Order #: [id number] Class: No Print  . Order #: [id number] Class: Normal  . Order #: [id number] Class: Historical Med  . Order #: [id number] Class: Historical Med  . Order #: [id number] Class: Historical Med  . Order #: [id number] Class: Historical Med  . Order #: [id number] Class: Print  . Order #: [id number] Class: Historical Med  . Order #:  [id number] Class: Historical Med  . Order #: [id number] Class: Historical Med  . Order #: [id number] Class: Normal  . Order #: [id number] Class: Historical Med  . Order #: [id number] Class: Print  . Order #: [id number] Class: Normal  . Order #: [id number] Class: Historical Med  . Order #: [id number] Class: Normal  . Order #: [id number] Class: Print  . Order #: [id number] Class: Normal  . Order #: [id number] Class: Historical Med  . Order #: [id number] Class: Print  . Order #: [id number] Class: Historical Med  . Order #: [id number] Class: Print  . Order #: [id number] Class: Normal  . Order #: [id number] Class: Historical Med  . Order #: [id number] Class: Historical Med  . Order #: [id number] Class: Print  . Order #: [id number] Class: Historical Med  . Order #: [id number] Class: Historical Med  . Order #: [id number] Class: Normal  . Order #: [id number] Class: Historical Med  . Order #: [id number] Class: Print    Allergies:  Aricept Reather [place] hcl];  Biaxin [clarithromycin]; Copaxone [glatiramer acetate]; Decadron [dexamethasone]; Dexamethasone sodium phosphate; Diltiazem hcl; Diltiazem hcl; Flagyl [metronidazole]; Gabapentin; Iohexol; Ivp dye [iodinated diagnostic agents]; Levothyroxine; Sulfa antibiotics; Sulfonamide derivatives; Synthroid [levothyroxine sodium]; Copaxone  [glatiramer]; and Interferon beta-1a  Family History: Family History  Problem Relation Age of Onset  . ALS Mother   . Kidney disease Sister   . Arthritis Sister   . Aneurysm Brother   . Heart disease Daughter   . Colon cancer    . Breast cancer Neg Hx   . Bladder Cancer Neg Hx   . Prostate cancer Neg Hx     Social History: Social History  Substance Use Topics  . Smoking status: Never Smoker  . Smokeless tobacco: Never Used  . Alcohol use No     Review of Systems:   10 point review of systems was performed and was otherwise negative:  Constitutional: No fever Eyes: No visual disturbances ENT: No sore throat, ear pain Cardiac: No chest pain Respiratory: No shortness of breath, wheezing, or stridor Abdomen: Lower middle quadrant abdominal pain. Patient denies any diarrhea to this historian states is more urinary symptoms that she has. She denies any melena or hematochezia. Endocrine: No weight loss, No night sweats Extremities: No peripheral edema, cyanosis Skin: No rashes, easy bruising Neurologic: No focal weakness, trouble with speech or swollowing Urologic urinary frequency with burning with urination   Physical Exam:  ED Triage Vitals  Enc Vitals Group     BP 08/28/16 1642 (!) 118/98     Pulse Rate 08/28/16 1642 76     Resp 08/28/16 1642 (!) 22     Temp 08/28/16 1642 98.7 F (37.1 C)     Temp Source 08/28/16 1642 Oral     SpO2 08/28/16 1642 99 %     Weight 08/28/16 1630 155 lb (70.3 kg)     Height 08/28/16 1630 5' (1.524 m)     Head Circumference --      Peak Flow --      Pain Score 08/28/16 1630 8     Pain Loc --      Pain Edu? --       Excl. in [place]? --     General: Awake , Alert , and Oriented times 3; GCS 15 Head: Normal cephalic , atraumatic Eyes: Pupils equal , round, reactive to light Nose/Throat: No nasal drainage, patent upper airway without erythema or exudate.  Neck: Supple, Full range of motion, No anterior adenopathy or palpable thyroid masses Lungs: Clear to ascultation without wheezes , rhonchi, or rales Heart: Regular rate, regular rhythm without murmurs ,  gallops , or rubs Abdomen: Numerous well-healed well aligned old surgical scars with some mild tenderness to deep palpation across the lower middle quadrant. No peritoneal signs. No palpable masses are noted.        Extremities: 2 plus symmetric pulses. No edema, clubbing or cyanosis Neurologic: normal ambulation, Motor symmetric without deficits, sensory intact Skin: warm, dry, no rashes   Labs:   All laboratory work was reviewed including any pertinent negatives or positives listed below:  Labs Reviewed  COMPREHENSIVE METABOLIC PANEL - Abnormal; Notable for the following:       Result Value   Glucose, Bld 118 (*)    BUN 25 (*)    Creatinine, Ser 1.08 (*)    GFR calc non Af Amer 46 (*)    GFR calc Af Amer 53 (*)    All other components within normal limits  URINALYSIS, COMPLETE (UACMP) WITH MICROSCOPIC - Abnormal; Notable for the following:    Color, Urine YELLOW (*)    APPearance CLEAR (*)    Ketones, ur 5 (*)    Protein, ur 30 (*)    Leukocytes, UA SMALL (*)    Squamous Epithelial / LPF 6-30 (*)    All other components within normal limits  URINE CULTURE  LIPASE, BLOOD  CBC  TROPONIN I  Urine culture is pending  Radiology:   "Ct Renal Stone Study  Result Date: 08/28/2016 CLINICAL DATA:  Onset of bilateral lower flank and abdominal pain. Nausea, vomiting and diarrhea. Initial encounter. EXAM: CT ABDOMEN AND PELVIS WITHOUT CONTRAST TECHNIQUE: Multidetector CT imaging of the abdomen and pelvis was performed following the standard  protocol without IV contrast. COMPARISON:  Pelvic ultrasound performed 07/27/2016, and CT of the abdomen and pelvis from 08/03/2015 FINDINGS: Lower chest: The visualized lung bases are grossly clear. The visualized portions of the mediastinum are unremarkable. Hepatobiliary: A small calcified granuloma is noted adjacent to the gallbladder fossa. The liver is otherwise unremarkable in appearance. The patient is status post cholecystectomy, with clips noted at the gallbladder fossa. The common bile duct remains normal in caliber. Pancreas: The pancreas is within normal limits. Spleen: A 3.9 cm cyst is noted at the anterior aspect of the spleen, with minimal calcification. The spleen is otherwise unremarkable. Adrenals/Urinary Tract: The adrenal glands are unremarkable in appearance. Scattered bilateral parenchymal calcifications are seen within the kidneys, with hypodense and hyperdense left renal cysts. There is no evidence of hydronephrosis. No definite renal or ureteral stones are identified. Nonspecific perinephric stranding is noted bilaterally. Stomach/Bowel: The stomach is unremarkable in appearance. The small bowel is within normal limits. The appendix is not visualized; there is no evidence for appendicitis. Scattered diverticulosis is noted along the transverse, descending and sigmoid colon, without evidence of diverticulitis. Vascular/Lymphatic: The abdominal aorta is unremarkable in appearance. The inferior vena cava is grossly unremarkable. No retroperitoneal lymphadenopathy is seen. No pelvic sidewall lymphadenopathy is identified. Reproductive: The bladder is decompressed and not well assessed. The patient is status post hysterectomy. No suspicious adnexal masses are seen. Other: No additional soft tissue abnormalities are seen. Musculoskeletal: No acute osseous abnormalities are identified. Multilevel vacuum phenomenon is noted along the lumbar spine. The patient is status post lumbar spinal fusion at  L4-L5. The visualized musculature is unremarkable in appearance. IMPRESSION: 1. No acute abnormality seen to explain the patient's symptoms. 2. Scattered diverticulosis along the transverse, descending and sigmoid colon, without evidence of diverticulitis. 3. Splenic cyst again noted, only minimally changed in size from 2016. 4. Left renal  cysts, and scattered bilateral renal parenchymal calcifications. 5. Mild degenerative change along the lumbar spine. Electronically Signed   By: Garald Balding M.D.   On: 08/28/2016 21:46  "  I personally reviewed the radiologic studies   ED Course:  Given the patient's presentation does not appear to be a life-threatening cause for her abdominal discomfort such as abdominal aneurysm. It also does not appear to be a surgical emergency such as a bowel obstruction. Patient has very mild findings of a urinary tract infection though she does have a history of recurrent cystitis and describes similar symptoms. Patient will be prescribed Macrobid which he's been on before. She was given IV fluids and low-dose Dilaudid here for pain and had some feelings of flushness and fullness in her throat. She was examined does not exhibit any signs of anaphylaxis at this time and is able drink fluids has a normal voice and is no stridor on exam. No tongue swelling or oral cavity swelling. She was observed here after receiving Benadryl and was at that point forward asymptomatic. I felt this was unlikely to be a severe allergic reaction.    Final Clinical Impression: *  Final diagnoses:  Lower abdominal pain  Lower urinary tract infectious disease     Plan: * Outpatient " Discharge Medication List as of 08/28/2016 10:01 PM    START taking these medications   Details  traMADol (ULTRAM) 50 MG tablet Take 1 tablet (50 mg total) by mouth every 6 (six) hours as needed., Starting Tue 08/28/2016, Print      "Macrobid  Patient was advised to return immediately if condition  worsens. Patient was advised to follow up with their primary care physician or other specialized physicians involved in their outpatient care. The patient and/or family member/power of attorney had laboratory results reviewed at the bedside. All questions and concerns were addressed and appropriate discharge instructions were distributed by the nursing staff.            Daymon Larsen, MD 08/28/16 2241

## 2016-08-28 NOTE — Discharge Instructions (Signed)
Please drink plenty of fluids and continue with over-the-counter pain medication. Please continue with scheduled follow-up for urinary complaints. CT scan of the abdomen showed no acute abnormalities in your urine shows mild signs of infection at this time. Urine culture is pending.  Please return immediately if condition worsens. Please contact her primary physician or the physician you were given for referral. If you have any specialist physicians involved in her treatment and plan please also contact them. Thank you for using [place] regional emergency Department.

## 2016-08-28 NOTE — Progress Notes (Signed)
I called [facility] Dr. Marzetta Board office and spoke with [name] to find out if she could be seen today. She informed me that they was to busy and she recommend that she go to the ER or Urgent care. Pt informed, no questions or concern.

## 2016-08-28 NOTE — ED Notes (Signed)
Pt states she has abd pain with vomiting and diarrhea.  Vomited x 1 today. No diarrhea today.   Pt states she has been feeling bad for 4 days.   Pt reports feeling weak from no appetite.  Pt alert.  Speech clear.  Family at bedside.

## 2016-08-28 NOTE — Patient Outreach (Signed)
[place] [facility]) Care Management  08/28/2016  [name] [birth year] [id number]    RN Health Coach attempted #1 Follow up outreach call to patient.  Patient was unavailable. Patient was in the Dr office. HIPPA compliance message was left husband. Plan: RN will call patient again within 14 days.  [place] Care Management [phone]

## 2016-08-30 ENCOUNTER — Telehealth: Payer: Self-pay

## 2016-08-30 ENCOUNTER — Telehealth: Payer: Self-pay | Admitting: Family Medicine

## 2016-08-30 DIAGNOSIS — N390 Urinary tract infection, site not specified: Secondary | ICD-10-CM

## 2016-08-30 DIAGNOSIS — R8271 Bacteriuria: Secondary | ICD-10-CM

## 2016-08-30 LAB — URINE CULTURE

## 2016-08-30 LAB — CULTURE, URINE COMPREHENSIVE

## 2016-08-30 MED ORDER — TRIMETHOPRIM 100 MG PO TABS: 100.0000 mg | ORAL_TABLET | Freq: Every day | ORAL | 3 refills | Status: DC

## 2016-08-30 NOTE — Telephone Encounter (Signed)
I reviewed her urine culture; it showed multiple species and was likely contaminated, so they recommend a repeat collection Please have her stop by Friday and submit another sample We'll hold on new antibiotics for now She can stop the macrobid

## 2016-08-30 NOTE — Assessment & Plan Note (Signed)
Reculture; see previous culture from ER

## 2016-08-30 NOTE — Telephone Encounter (Signed)
Pt called stating she was given an abx from ER earlier in the week and is not able to tolerated abx. Per Larene Beach pt should d/c macrobid and start trimethoprim. Made pt aware. Medication sent to pharmacy.

## 2016-08-30 NOTE — Telephone Encounter (Signed)
Called and patient a vm to come to the office on Fri. 09/01/15 to give a urine sample. Also informed patient on voicemail that she can stop taking the macrobid.

## 2016-08-30 NOTE — Telephone Encounter (Signed)
Patient was seen in the ED at [facility] on 08/28/16 and was prescribed nitrofurantioin, macrocrystal-monohydrate (Macrobid) 100mg .  Patient stated that the medication makes her feel sick.  Patient is scheduled to see Dr. Sanda Klein on 09/03/16 @ 1:20pm.  Please advise.

## 2016-08-31 ENCOUNTER — Telehealth: Payer: Self-pay

## 2016-08-31 ENCOUNTER — Other Ambulatory Visit: Payer: Self-pay

## 2016-08-31 DIAGNOSIS — R8271 Bacteriuria: Secondary | ICD-10-CM | POA: Diagnosis not present

## 2016-08-31 DIAGNOSIS — N390 Urinary tract infection, site not specified: Secondary | ICD-10-CM

## 2016-08-31 MED ORDER — CIPROFLOXACIN HCL 500 MG PO TABS: 500.0000 mg | ORAL_TABLET | Freq: Two times a day (BID) | ORAL | 0 refills | Status: DC

## 2016-08-31 NOTE — Telephone Encounter (Signed)
Spoke with pt in reference to abx. Reinforced with pt not to take abx on hand to pick up new rx from pharmacy. Pt voiced understanding. abx sent to pharmacy.

## 2016-08-31 NOTE — Telephone Encounter (Signed)
-----   Message from Nori Riis, PA-C sent at 08/30/2016 10:26 PM EST ----- Patient needs to stop her nitrofurantoin and trimethoprim and start seven days of Cipro 250 mg bid x 7.

## 2016-09-01 LAB — URINALYSIS W MICROSCOPIC + REFLEX CULTURE
BACTERIA UA: NONE SEEN [HPF]
Bilirubin Urine: NEGATIVE
GLUCOSE, UA: NEGATIVE
HGB URINE DIPSTICK: NEGATIVE
Ketones, ur: NEGATIVE
Nitrite: NEGATIVE
Protein, ur: NEGATIVE
Specific Gravity, Urine: 1.024 (ref 1.001–1.035)
YEAST: NONE SEEN [HPF]
pH: 5.5 (ref 5.0–8.0)

## 2016-09-03 ENCOUNTER — Encounter: Payer: Self-pay | Admitting: Family Medicine

## 2016-09-03 ENCOUNTER — Ambulatory Visit (INDEPENDENT_AMBULATORY_CARE_PROVIDER_SITE_OTHER): Payer: Medicare HMO | Admitting: Family Medicine

## 2016-09-03 VITALS — BP 124/70 | HR 74 | Temp 98.1°F | Resp 16 | Wt 150.0 lb

## 2016-09-03 DIAGNOSIS — R1031 Right lower quadrant pain: Secondary | ICD-10-CM

## 2016-09-03 DIAGNOSIS — N3010 Interstitial cystitis (chronic) without hematuria: Secondary | ICD-10-CM | POA: Diagnosis not present

## 2016-09-03 DIAGNOSIS — K649 Unspecified hemorrhoids: Secondary | ICD-10-CM

## 2016-09-03 DIAGNOSIS — K6289 Other specified diseases of anus and rectum: Secondary | ICD-10-CM

## 2016-09-03 LAB — POC HEMOCCULT BLD/STL (OFFICE/1-CARD/DIAGNOSTIC): FECAL OCCULT BLD: NEGATIVE

## 2016-09-03 LAB — URINE CULTURE

## 2016-09-03 MED ORDER — HYDROCORTISONE 1 % RE CREA: TOPICAL_CREAM | RECTAL | 0 refills | Status: DC

## 2016-09-03 NOTE — Progress Notes (Signed)
BP 124/70   Pulse 74   Temp 98.1 F (36.7 C) (Oral)   Resp 16   Wt 150 lb (68 kg)   SpO2 98%   BMI 26.57 kg/m    Subjective:    Patient ID: [name], female    DOB: 07-07-1930, 83 y.o.   MRN: [id number]  HPI: [name] is a 83 y.o. female  Chief Complaint  Patient presents with  . Follow-up    Hospital F/U   Patient is here for hospital follow-up She says she is still sick; pain in the sides on both sides Taking cipro and still has half the bottle to go yet Laying down all the time Very tired They did a CT scan -------------------------------------------- IMPRESSION: 1. No acute abnormality seen to explain the patient's symptoms. 2. Scattered diverticulosis along the transverse, descending and sigmoid colon, without evidence of diverticulitis. 3. Splenic cyst again noted, only minimally changed in size from 2016. 4. Left renal cysts, and scattered bilateral renal parenchymal calcifications. 5. Mild degenerative change along the lumbar spine.   Electronically Signed   By: Garald Balding M.D.   On: 08/28/2016 21:46 --------------------------------------------- She was in the ER on August 28, 2016 for abd pain with nausea and vomiting Still nauseated, but no vomiting beyond just coming up in the throat Was having burning and urgency  She was found to have a bladder infection Antibiotic was changed to fluoroquinolone by urologist, and recent urine culture shows that is indeed sensitive  [place] by urologist ---------------------------------------------- IMPRESSION: Soft tissue thickening of the proximal urethra measuring up to 2 cm. This may represent urethral diverticulum with inspissated debris versus true soft tissue mass.   Electronically Signed   By: Fidela Salisbury M.D.   On: 07/27/2016 15:53 ------------------------------------------------ Still having rectal pain; saw the general surgeon Dr. Jamal Collin for this; note reviewed and still  having the pain in the sides  Depression screen [facility] 2/9 09/11/2016 09/03/2016 07/31/2016 07/03/2016 06/25/2016  Decreased Interest 0 0 0 0 0  Down, Depressed, Hopeless 0 0 0 1 1  PHQ - 2 Score 0 0 0 1 1  Altered sleeping - - - - -  Tired, decreased energy - - - - -  Change in appetite - - - - -  Feeling bad or failure about yourself  - - - - -  Trouble concentrating - - - - -  Moving slowly or fidgety/restless - - - - -  Suicidal thoughts - - - - -  PHQ-9 Score - - - - -  Difficult doing work/chores - - - - -  Some recent data might be hidden    Relevant past medical, surgical, family and social history reviewed Past Medical History:  Diagnosis Date  . Anxiety and depression   . Breast cancer ([place]) 2015   LT LUMPECTOMY  . Carpal tunnel syndrome   . Chest pain    SECONDARY TO GERD  . Chronic interstitial cystitis   . Chronic right hip pain   . Depression   . Diabetes mellitus without complication ([place])   . Diverticulosis   . Dyslipidemia   . Essential hypertension, benign   . Fibromyalgia   . Frequent falls   . GERD (gastroesophageal reflux disease)   . Hearing loss of left ear   . History of fractured rib   . History of TIA (transient ischemic attack)   . Irritable bowel   . MS (multiple sclerosis) ([place])   . Osteoarthritis   .  Paroxysmal supraventricular tachycardia ([place])   . Peripheral neuropathy (HCC)    MILD  . Personal history of radiation therapy 2015   BREAST CA  . Protein malnutrition ([place])   . Pure hypercholesterolemia   . Recurrent UTI   . Skin cancer   . SOB (shortness of breath)    SECONDARY TO REFLUX  . Solitary pulmonary nodule on lung CT 12/04/2015   3 mm LLL lung nodule June 2016, March 2017  . Tachycardia, paroxysmal (HCC)    Reportedly Paroxysmal Supraventricular Tachycardia, but unable to find documentation confirming this  . Thyroid nodule   . Vertigo   . Vitamin B 12 deficiency    Past Surgical History:  Procedure Laterality Date  .  APPENDECTOMY    . AUGMENTATION MAMMAPLASTY Right 1975   BREAST BREAST ONLY  . BLADDER TACKING     X 3  . BREAST LUMPECTOMY    . BREAST SURGERY    . CARDIAC CATHETERIZATION  10/2001   Normal coronary arteries with the exception of 20% proximal D1  . CHOLECYSTECTOMY    . COLONOSCOPY WITH PROPOFOL N/A 01/21/2015   Procedure: COLONOSCOPY WITH PROPOFOL;  Surgeon: Josefine Class, MD;  Location[facility] ENDOSCOPY;  Service: Endoscopy;  Laterality: N/A;  . ESOPHAGOGASTRODUODENOSCOPY N/A 01/21/2015   Procedure: ESOPHAGOGASTRODUODENOSCOPY (EGD);  Surgeon: Josefine Class, MD;  Location[facility] ENDOSCOPY;  Service: Endoscopy;  Laterality: N/A;  . EYE SURGERIES     WITH BUCKLE DETACHMENT OF THE RETINA  . heart monitor     Not being used... Patient can not have a MRI  . HEMORRHOID SURGERY     WITH RECONSTRUCTION  . MASTECTOMY Right 1970   SUBCUTANEOUS - NO BREAST CANCER  . TONSILLECTOMY    . TOTAL ABDOMINAL HYSTERECTOMY W/ BILATERAL SALPINGOOPHORECTOMY    . TYMPANOPLASTY     Social History  Substance Use Topics  . Smoking status: Never Smoker  . Smokeless tobacco: Never Used  . Alcohol use No   Interim medical history since last visit reviewed. Allergies and medications reviewed  Review of Systems Per HPI unless specifically indicated above     Objective:    BP 124/70   Pulse 74   Temp 98.1 F (36.7 C) (Oral)   Resp 16   Wt 150 lb (68 kg)   SpO2 98%   BMI 26.57 kg/m   Wt Readings from Last 3 Encounters:  09/12/16 150 lb 12.8 oz (68.4 kg)  09/05/16 150 lb (68 kg)  09/03/16 150 lb (68 kg)    Physical Exam  Constitutional: She appears well-developed and well-nourished.  HENT:  Mouth/Throat: Mucous membranes are normal.  Blue tube in the left ear  Eyes: EOM are normal. No scleral icterus.  Cardiovascular: Normal rate and regular rhythm.   Pulmonary/Chest: Effort normal and breath sounds normal.  Lymphadenopathy:    She has no cervical adenopathy.  Psychiatric: She has  a normal mood and affect. Her behavior is normal. Her mood appears not anxious. She does not exhibit a depressed mood.      Assessment & Plan:   Problem List Items Addressed This Visit      Cardiovascular and Mediastinum   Hemorrhoids    Managed by surgeon; hydrocortisone topically until she can be seen        Genitourinary   Chronic interstitial cystitis    Managed by urologist; not sure if this is part of her pain but encouraged her to keep f/u with urology  Other   Right lower quadrant abdominal pain    Has been a chronic issue, noted in problem list with previous primary back on 09/02/2015, "likely due to scar tissue"       Other Visit Diagnoses    Pain in rectum    -  Primary   Relevant Orders   POC Hemoccult Bld/Stl (1-Cd Office Dx) (Completed)     Follow up plan: No Follow-up on file.  An after-visit summary was printed and given to the patient at [facility].  Please see the patient instructions which may contain other information and recommendations beyond what is mentioned above in the assessment and plan.  Meds ordered this encounter  Medications  . memantine (NAMENDA) 10 MG tablet  . hydrocortisone (PROCTO-PAK) 1 % CREA    Sig: Apply to rectum twice a day    Dispense:  28.35 g    Refill:  0    Orders Placed This Encounter  Procedures  . POC Hemoccult Bld/Stl (1-Cd Office Dx)

## 2016-09-03 NOTE — Patient Instructions (Signed)
Please do follow-up with Dr. Jamal Collin; stop by his office today on your way out and schedule an appointment for your rectal pain Let's try the cortisone cream applied to your rectal area twice a day until you see him If your symptoms worsen, please go back to the ER

## 2016-09-04 ENCOUNTER — Telehealth: Payer: Self-pay | Admitting: Family Medicine

## 2016-09-04 NOTE — Telephone Encounter (Signed)
Patient stated that she is scheduled to see Dr. Jamal Collin on tomorrow, 09/05/16 but she is needing something for pain because the tramadol is not working.

## 2016-09-04 NOTE — Telephone Encounter (Signed)
I cannot prescribe anything stronger than tramadol; she has my permission to take two tramadol at a time (100 mg) once if she is in absolute terrible pain I see that she has oxycodone (percocet) on her list from [facility]; if she has any left over, she could use that INSTEAD of tramadol Do NOT take tramadol and oxycodone (percocet) together She could also ask her pharmacist about OTC lidocaine that she can put right on the area

## 2016-09-05 ENCOUNTER — Encounter: Payer: Self-pay | Admitting: General Surgery

## 2016-09-05 ENCOUNTER — Ambulatory Visit (INDEPENDENT_AMBULATORY_CARE_PROVIDER_SITE_OTHER): Payer: Medicare HMO | Admitting: General Surgery

## 2016-09-05 VITALS — BP 134/76 | HR 72 | Resp 14 | Ht 62.0 in | Wt 150.0 lb

## 2016-09-05 DIAGNOSIS — K6289 Other specified diseases of anus and rectum: Secondary | ICD-10-CM

## 2016-09-05 NOTE — Progress Notes (Addendum)
Patient ID: [name], female   DOB: 1930-01-25, 83 y.o.   MRN: [id number]  Chief Complaint  Patient presents with  . Other    HPI [name] is a 83 y.o. female here today for rectal pain. She still complains of fairly significant rectal pain and pressure sensation in the lower abdomen and pelvic area as well as her back. This has been going on since November 2017. She does have some urologic findings in her bladder/ureter and is being followed by urologist. Since last seen few mos ago rectal pain has been somewhat persistent. I have reviewed the history of present illness with the patient.  HPI  Past Medical History:  Diagnosis Date  . Anxiety and depression   . Breast cancer ([place]) 2015   LT LUMPECTOMY  . Carpal tunnel syndrome   . Chest pain    SECONDARY TO GERD  . Chronic interstitial cystitis   . Chronic right hip pain   . Depression   . Diabetes mellitus without complication ([place])   . Diverticulosis   . Dyslipidemia   . Essential hypertension, benign   . Fibromyalgia   . Frequent falls   . GERD (gastroesophageal reflux disease)   . Hearing loss of left ear   . History of fractured rib   . History of TIA (transient ischemic attack)   . Irritable bowel   . MS (multiple sclerosis) ([place])   . Osteoarthritis   . Paroxysmal supraventricular tachycardia ([place])   . Peripheral neuropathy (HCC)    MILD  . Personal history of radiation therapy 2015   BREAST CA  . Protein malnutrition ([place])   . Pure hypercholesterolemia   . Recurrent UTI   . Skin cancer   . SOB (shortness of breath)    SECONDARY TO REFLUX  . Solitary pulmonary nodule on lung CT 12/04/2015   3 mm LLL lung nodule June 2016, March 2017  . Tachycardia, paroxysmal (HCC)    Reportedly Paroxysmal Supraventricular Tachycardia, but unable to find documentation confirming this  . Thyroid nodule   . Vertigo   . Vitamin B 12 deficiency     Past Surgical History:  Procedure Laterality Date  .  APPENDECTOMY    . AUGMENTATION MAMMAPLASTY Right 1975   BREAST BREAST ONLY  . BLADDER TACKING     X 3  . BREAST LUMPECTOMY    . BREAST SURGERY    . CARDIAC CATHETERIZATION  10/2001   Normal coronary arteries with the exception of 20% proximal D1  . CHOLECYSTECTOMY    . COLONOSCOPY WITH PROPOFOL N/A 01/21/2015   Procedure: COLONOSCOPY WITH PROPOFOL;  Surgeon: Josefine Class, MD;  Location: [facility] ENDOSCOPY;  Service: Endoscopy;  Laterality: N/A;  . ESOPHAGOGASTRODUODENOSCOPY N/A 01/21/2015   Procedure: ESOPHAGOGASTRODUODENOSCOPY (EGD);  Surgeon: Josefine Class, MD;  Location[facility] ENDOSCOPY;  Service: Endoscopy;  Laterality: N/A;  . EYE SURGERIES     WITH BUCKLE DETACHMENT OF THE RETINA  . heart monitor     Not being used... Patient can not have a MRI  . HEMORRHOID SURGERY     WITH RECONSTRUCTION  . MASTECTOMY Right 1970   SUBCUTANEOUS - NO BREAST CANCER  . TONSILLECTOMY    . TOTAL ABDOMINAL HYSTERECTOMY W/ BILATERAL SALPINGOOPHORECTOMY    . TYMPANOPLASTY      Family History  Problem Relation Age of Onset  . ALS Mother   . Kidney disease Sister   . Arthritis Sister   . Aneurysm Brother   . Heart disease  Daughter   . Colon cancer    . Breast cancer Neg Hx   . Bladder Cancer Neg Hx   . Prostate cancer Neg Hx     Social History Social History  Substance Use Topics  . Smoking status: Never Smoker  . Smokeless tobacco: Never Used  . Alcohol use No    Allergies  Allergen Reactions  . Aricept [Donepezil Hcl] Other (See Comments)    hallucinations  . Biaxin [Clarithromycin] Other (See Comments) and Diarrhea  . Copaxone [Glatiramer Acetate]   . Decadron [Dexamethasone] Nausea And Vomiting and Other (See Comments)    Other reaction(s): Muscle Pain Reaction:  Abdominal pain  . Dexamethasone Sodium Phosphate Other (See Comments)  . Diltiazem Hcl Other (See Comments)    Reaction:  Unknown   . Diltiazem Hcl Other (See Comments)  . Flagyl [Metronidazole] Nausea Only  and Diarrhea    Other reaction(s): Vomiting  . Gabapentin Other (See Comments)    "burning all over" Reaction:  Unknown   . Iohexol Swelling and Other (See Comments)     Desc: tongue swelling with "IVP dye" sccording to nurses notes with a lumbar myelo-12/09- asm, Onset Date: [id number]  Pts tongue swells.  Clementeen Hoof [Iodinated Diagnostic Agents] Swelling and Other (See Comments)    Passed out  Pts tongue swells.   . Levothyroxine Nausea Only  . Macrobid [Nitrofurantoin Macrocrystal] Nausea And Vomiting  . Sulfa Antibiotics Other (See Comments)    Reaction:  Unknown   . Sulfonamide Derivatives Other (See Comments)    Reaction:  Unknown   . Synthroid [Levothyroxine Sodium]   . Copaxone  [Glatiramer] Rash  . Interferon Beta-1a Other (See Comments) and Rash    Reaction:  Unknown     Current Outpatient Prescriptions  Medication Sig Dispense Refill  . acetaminophen (TYLENOL) 500 MG tablet Take by mouth.    Marland Kitchen aspirin EC 81 MG tablet Take 1 tablet (81 mg total) by mouth daily. 30 tablet 11  . atorvastatin (LIPITOR) 20 MG tablet Take 1 tablet (20 mg total) by mouth at bedtime. 30 tablet 2  . b complex vitamins tablet Take 1 tablet by mouth daily.    . Carboxymethylcell-Hypromellose (GENTEAL OP) Apply 1 drop to eye. Each eye as needed    . Cholecalciferol (VITAMIN D3) 50000 units CAPS Take 5,000 Int'l Units/day by mouth once a week. Reported on 01/25/2016 1 capsule   . ciprofloxacin (CIPRO) 500 MG tablet Take 1 tablet (500 mg total) by mouth every 12 (twelve) hours. (Patient not taking: Reported on 09/12/2016) 14 tablet 0  . Cyanocobalamin (VITAMIN B-12) 1000 MCG SUBL     . diazepam (VALIUM) 5 MG tablet Take one tablet 30 minutes prior to MRI (Patient not taking: Reported on 09/12/2016) 1 tablet 0  . dicyclomine (BENTYL) 10 MG capsule Take by mouth.    . erythromycin ophthalmic ointment     . estradiol (ESTRACE VAGINAL) 0.1 MG/GM vaginal cream Place 1 Applicatorful vaginally at bedtime.    .  ferrous sulfate 325 (65 FE) MG tablet Take 1 tablet (325 mg total) by mouth daily with breakfast. 90 tablet 1  . fluticasone (FLONASE) 50 MCG/ACT nasal spray SPRAY 2 SPRAYS EACH INTO BOTH NOSTRILS ONCE DAILY EACH NIGHT.  5  . hydrocortisone (PROCTO-PAK) 1 % CREA Apply to rectum twice a day 28.35 g 0  . levofloxacin (LEVAQUIN) 250 MG tablet Take 1 tablet (250 mg total) by mouth daily. (Patient not taking: Reported on 09/12/2016) 7 tablet 1  .  loperamide (IMODIUM A-D) 2 MG tablet Take 2 mg by mouth 4 (four) times daily as needed for diarrhea or loose stools.    . meclizine (ANTIVERT) 25 MG tablet Take 1 tablet (25 mg total) by mouth every 8 (eight) hours as needed for dizziness. 30 tablet 2  . memantine (NAMENDA) 10 MG tablet     . metFORMIN (GLUCOPHAGE) 500 MG tablet Take 1 tablet (500 mg total) by mouth 2 (two) times daily with a meal. (Patient taking differently: Take 500 mg by mouth daily. ) 180 tablet 1  . mirabegron ER (MYRBETRIQ) 25 MG TB24 tablet Take 25 mg by mouth daily.    . Misc. Devices (HUGO ROLLING WALKER ELITE) MISC Rolling walker; knee sprain, arthritis; LON 99 months 1 each 0  . neomycin-polymyxin-hydrocortisone (CORTISPORIN) 3.5-10000-1 otic suspension Reported on 01/05/2016    . nystatin cream (MYCOSTATIN) Apply 1 application topically 2 (two) times daily. (Patient not taking: Reported on 09/12/2016) 30 g 0  . omeprazole (PRILOSEC) 40 MG capsule Take 40 mg by mouth daily.     . ondansetron (ZOFRAN) 4 MG tablet Take 4 mg by mouth every 8 (eight) hours as needed for nausea or vomiting. Reported on 12/22/2015    . oxycodone-acetaminophen (PERCOCET) 2.5-325 MG tablet Take 1 tablet by mouth every 4 (four) hours as needed for pain. (Patient not taking: Reported on 09/12/2016) 10 tablet 0  . polyethylene glycol powder (GLYCOLAX/MIRALAX) powder Take by mouth.    . Probiotic Product (PROBIOTIC COLON SUPPORT) CAPS Take 1 capsule by mouth daily.     . sertraline (ZOLOFT) 50 MG tablet Take 1 tablet (50  mg total) by mouth daily. 90 tablet 3  . Simethicone (GAS-X EXTRA STRENGTH) 125 MG CAPS Take 1 capsule by mouth as needed. Reported on 11/24/2015    . traMADol (ULTRAM) 50 MG tablet Take 1 tablet (50 mg total) by mouth every 6 (six) hours as needed. (Patient not taking: Reported on 09/12/2016) 20 tablet 0  . trimethoprim (TRIMPEX) 100 MG tablet Take 1 tablet (100 mg total) by mouth daily. (Patient not taking: Reported on 09/24/2016) 30 tablet 3  . trimethoprim (TRIMPEX) 100 MG tablet Take 1 tablet (100 mg total) by mouth daily. (Patient not taking: Reported on 09/24/2016) 90 tablet 0   No current facility-administered medications for this visit.     Review of Systems Review of Systems  Constitutional: Negative.   Respiratory: Negative.   Cardiovascular: Negative.   Gastrointestinal: Positive for abdominal pain, constipation and rectal pain.    Blood pressure 134/76, pulse 72, resp. rate 14, height 5' 2"  (1.575 m), weight 150 lb (68 kg).  Physical Exam Physical Exam  Constitutional: She is oriented to person, place, and time. She appears well-developed and well-nourished.  Genitourinary: Rectal exam shows tenderness. Rectal exam shows no external hemorrhoid, no fissure and no mass.  Genitourinary Comments: Tenderness is non focal. No findings of induration, fullness, external hemorrhoids or fissure  Neurological: She is alert and oriented to person, place, and time.  Skin: Skin is warm and dry.  With consent digital exam and anoscopy completed. Small internal hemorrhoid at 3 ocl, no other findings  Data Reviewed Prior notes reviewed   Assessment    Pt had prior CT scan which found no apparent cause of proctalgia. Physical exam showed no abnormalities to explain rectal pain. Anoscope revealed left internal hemorrhoid, but no other abnormalities    Plan    Digital rectal exam showed no masses, fissures, external hemorrhoids, or other abnormalities.  Anoscope was performed. Mucosa was  examined, and a left internal hemorrhoid located at 3OL was identified, but no other abnormalities.  An MRI was suggested to the patient, but she reported blistering from a prior MRI.  Patient to be scheduled for repeat abdominal pelvic CT scan with oral/rectal contrast.     This information has been scribed by Gaspar Cola CMA.     Florice Hindle G 09/26/2016, 8:41 AM

## 2016-09-05 NOTE — Patient Instructions (Signed)
Patient to be scheduled for ct scan .  

## 2016-09-05 NOTE — Telephone Encounter (Signed)
Pt.notified

## 2016-09-06 ENCOUNTER — Telehealth: Payer: Self-pay

## 2016-09-06 NOTE — Telephone Encounter (Signed)
Pt called stating she is still having horrible rectal pain, nausea, and feeling horrible. Pt stated that she saw Dr. Jamal Collin yesterday and he did a procedure. Pt stated that a physician, could not tell me who, gave her tramadol for her pain. Pt stated its not touching her pain and requested more percocet. Please advise.

## 2016-09-06 NOTE — Telephone Encounter (Signed)
She needs to contact Dr. Angie Fava and let them know the Tramadol is not helping her pain.

## 2016-09-07 NOTE — Telephone Encounter (Signed)
Spoke with pt in reference to pain medication and needing to call Dr. Jamal Collin. Pt voiced understanding.

## 2016-09-11 ENCOUNTER — Other Ambulatory Visit: Payer: Self-pay | Admitting: *Deleted

## 2016-09-12 ENCOUNTER — Ambulatory Visit: Payer: Commercial Managed Care - HMO

## 2016-09-12 ENCOUNTER — Encounter: Payer: Self-pay | Admitting: *Deleted

## 2016-09-12 ENCOUNTER — Encounter: Payer: Self-pay | Admitting: Urology

## 2016-09-12 ENCOUNTER — Ambulatory Visit (INDEPENDENT_AMBULATORY_CARE_PROVIDER_SITE_OTHER): Payer: Medicare HMO | Admitting: Urology

## 2016-09-12 VITALS — BP 161/73 | HR 73 | Ht 59.0 in | Wt 150.8 lb

## 2016-09-12 DIAGNOSIS — N952 Postmenopausal atrophic vaginitis: Secondary | ICD-10-CM

## 2016-09-12 DIAGNOSIS — R3129 Other microscopic hematuria: Secondary | ICD-10-CM

## 2016-09-12 DIAGNOSIS — R32 Unspecified urinary incontinence: Secondary | ICD-10-CM

## 2016-09-12 DIAGNOSIS — N390 Urinary tract infection, site not specified: Secondary | ICD-10-CM

## 2016-09-12 DIAGNOSIS — R296 Repeated falls: Secondary | ICD-10-CM

## 2016-09-12 DIAGNOSIS — N361 Urethral diverticulum: Secondary | ICD-10-CM

## 2016-09-12 DIAGNOSIS — N3010 Interstitial cystitis (chronic) without hematuria: Secondary | ICD-10-CM | POA: Diagnosis not present

## 2016-09-12 LAB — URINALYSIS, COMPLETE
Bilirubin, UA: POSITIVE — AB
Glucose, UA: NEGATIVE
Nitrite, UA: NEGATIVE
PH UA: 5.5 (ref 5.0–7.5)
Urobilinogen, Ur: 0.2 mg/dL (ref 0.2–1.0)

## 2016-09-12 LAB — MICROSCOPIC EXAMINATION

## 2016-09-12 MED ORDER — TRIMETHOPRIM 100 MG PO TABS: 100.0000 mg | ORAL_TABLET | Freq: Every day | ORAL | 0 refills | Status: DC

## 2016-09-12 MED ORDER — SODIUM BICARBONATE 8.4 % IV SOLN
11.0000 mL | Freq: Once | INTRAVENOUS | Status: AC
  Administered 2016-09-12: 11 mL

## 2016-09-12 NOTE — Progress Notes (Signed)
In and Out Catheterization  Patient is present today for a I & O catheterization due to needing a clean catch urine. Patient was cleaned and prepped in a sterile fashion with betadine and Lidocaine 2% jelly was instilled into the urethra.  A 14FR cath was inserted no complications were noted , 5ml of urine return was noted, urine was yellow in color. Clean catch specimen needed for Urinalysis, catheter was removed with out difficulty.    Preformed by: Donnie Mesa CMA

## 2016-09-12 NOTE — Patient Outreach (Signed)
[facility]) Care Management  09/12/2016   [name] 02-18-1930 [id number]  RN Health Coach telephone call to patient.  [employer] compliance verified. Per patient she has been having a lot of rectal pain a. Patient stated she has been having a lot of rectal pain.Per patient she has been using creams and other medications. Per patient she has not been checking her blood sugars in a long time. Patient stated she has been seeing so many Dr and she guess they have been checking it. Per patient she has been nauseated and having diarrhea. Patient has agreed to follow up outreach calls.     Current Medications:  Current Outpatient Prescriptions  Medication Sig Dispense Refill  . acetaminophen (TYLENOL) 500 MG tablet Take by mouth.    Marland Kitchen aspirin EC 81 MG tablet Take 1 tablet (81 mg total) by mouth daily. 30 tablet 11  . atorvastatin (LIPITOR) 20 MG tablet Take 1 tablet (20 mg total) by mouth at bedtime. 30 tablet 2  . b complex vitamins tablet Take 1 tablet by mouth daily.    . Carboxymethylcell-Hypromellose (GENTEAL OP) Apply 1 drop to eye. Each eye as needed    . Cholecalciferol (VITAMIN D3) 50000 units CAPS Take 5,000 Int'l Units/day by mouth once a week. Reported on 01/25/2016 1 capsule   . ciprofloxacin (CIPRO) 500 MG tablet Take 1 tablet (500 mg total) by mouth every 12 (twelve) hours. (Patient not taking: Reported on 09/12/2016) 14 tablet 0  . Cyanocobalamin (VITAMIN B-12) 1000 MCG SUBL     . diazepam (VALIUM) 5 MG tablet Take one tablet 30 minutes prior to MRI (Patient not taking: Reported on 09/12/2016) 1 tablet 0  . dicyclomine (BENTYL) 10 MG capsule Take by mouth.    . erythromycin ophthalmic ointment     . estradiol (ESTRACE VAGINAL) 0.1 MG/GM vaginal cream Place 1 Applicatorful vaginally at bedtime.    . ferrous sulfate 325 (65 FE) MG tablet Take 1 tablet (325 mg total) by mouth daily with breakfast. 90 tablet 1  . fluticasone (FLONASE) 50 MCG/ACT nasal spray SPRAY 2  SPRAYS EACH INTO BOTH NOSTRILS ONCE DAILY EACH NIGHT.  5  . hydrocortisone (PROCTO-PAK) 1 % CREA Apply to rectum twice a day 28.35 g 0  . levofloxacin (LEVAQUIN) 250 MG tablet Take 1 tablet (250 mg total) by mouth daily. (Patient not taking: Reported on 09/12/2016) 7 tablet 1  . loperamide (IMODIUM A-D) 2 MG tablet Take 2 mg by mouth 4 (four) times daily as needed for diarrhea or loose stools.    . meclizine (ANTIVERT) 25 MG tablet Take 1 tablet (25 mg total) by mouth every 8 (eight) hours as needed for dizziness. 30 tablet 2  . memantine (NAMENDA) 10 MG tablet     . metFORMIN (GLUCOPHAGE) 500 MG tablet Take 1 tablet (500 mg total) by mouth 2 (two) times daily with a meal. (Patient taking differently: Take 500 mg by mouth daily. ) 180 tablet 1  . mirabegron ER (MYRBETRIQ) 25 MG TB24 tablet Take 25 mg by mouth daily.    . Misc. Devices (HUGO ROLLING WALKER ELITE) MISC Rolling walker; knee sprain, arthritis; LON 99 months (Patient not taking: Reported on 09/12/2016) 1 each 0  . neomycin-polymyxin-hydrocortisone (CORTISPORIN) 3.5-10000-1 otic suspension Reported on 01/05/2016    . nystatin cream (MYCOSTATIN) Apply 1 application topically 2 (two) times daily. (Patient not taking: Reported on 09/12/2016) 30 g 0  . omeprazole (PRILOSEC) 40 MG capsule Take 40 mg by mouth daily.     Marland Kitchen  ondansetron (ZOFRAN) 4 MG tablet Take 4 mg by mouth every 8 (eight) hours as needed for nausea or vomiting. Reported on 12/22/2015    . oxycodone-acetaminophen (PERCOCET) 2.5-325 MG tablet Take 1 tablet by mouth every 4 (four) hours as needed for pain. (Patient not taking: Reported on 09/12/2016) 10 tablet 0  . polyethylene glycol powder (GLYCOLAX/MIRALAX) powder Take by mouth.    . Probiotic Product (PROBIOTIC COLON SUPPORT) CAPS Take 1 capsule by mouth daily.     . sertraline (ZOLOFT) 50 MG tablet Take 1 tablet (50 mg total) by mouth daily. 90 tablet 3  . Simethicone (GAS-X EXTRA STRENGTH) 125 MG CAPS Take 1 capsule by mouth as needed.  Reported on 11/24/2015    . traMADol (ULTRAM) 50 MG tablet Take 1 tablet (50 mg total) by mouth every 6 (six) hours as needed. (Patient not taking: Reported on 09/12/2016) 20 tablet 0  . trimethoprim (TRIMPEX) 100 MG tablet Take 1 tablet (100 mg total) by mouth daily. 30 tablet 3  . trimethoprim (TRIMPEX) 100 MG tablet Take 1 tablet (100 mg total) by mouth daily. 90 tablet 0   No current facility-administered medications for this visit.     Functional Status:  In your present state of health, do you have any difficulty performing the following activities: 09/11/2016 09/03/2016  Hearing? [name]  Vision? Y Y  Difficulty concentrating or making decisions? [name]  Walking or climbing stairs? Y Y  Dressing or bathing? Y Y  Doing errands, shopping? [name]  Preparing Food and eating ? Y -  Using the Toilet? N -  In the past six months, have you accidently leaked urine? Y -  Do you have problems with loss of bowel control? N -  Managing your Medications? N -  Managing your Finances? N -  Housekeeping or managing your Housekeeping? Y -  Some recent data might be hidden    Fall/Depression Screening: PHQ 2/9 Scores 09/11/2016 09/03/2016 07/31/2016 07/03/2016 06/25/2016 05/14/2016 05/09/2016  PHQ - 2 Score 0 0 0 1 1 1  0  PHQ- 9 Score - - - - - - -   THN CM Care Plan Problem One   Flowsheet Row Most Recent Value  Care Plan Problem One  Knowledge deficit in self management of diabetes  Role Documenting the Problem One  [place] for Problem One  Active  THN Long Term Goal (31-90 days)  Patient will report checking and recording  blood sugars within the next 90 days  THN Long Term Goal Start Date  09/12/16  Interventions for Problem One Long Term Goal  Rn sent educatioanl materialon why check your blood sugars. How to check your blood sugars and when to check your blood sugars. Rn will foollow up with out reach call  THN CM Short Term Goal #1 (0-30 days)  Patient will verbalize reading information  on sick days within the the next 30 days  THN CM Short Term Goal #1 Start Date  09/12/16  Interventions for Short Term Goal #1  RN sent EMMI  educational material on sick days. RN will follow up with outreach call      Assessment:  Patient has not been monitoring blood sugars Patient will continue to benefit from [place] telephonic outreach for education and support for diabetes self management.  Plan: RN sent EMMI educational material on When you are sick RN sent educational material on checking your blood sugar Rn sent educational material on Why check your  blood sugar RN sent educational material on When to check your blood sugar RN sent educational material on How to check your blood sugar RN will follow up outreach within the month of March  Verle [facility] [place] Care Management [phone]

## 2016-09-12 NOTE — Progress Notes (Signed)
11:13 AM   [name] 09-22-1929 [id number]  Referring provider: Arnetha Courser, MD [address] [place], [name] [address]  Chief Complaint  Patient presents with  . Follow-up    IC  Rescue Solution    HPI: Patient is an 83 year old Caucasian female with a history of IC, fall risk, a history of hematuria, incontinence, urethral diverticulum and a history of recurrent UTI's who presents today for a rescue installation.     Fall risk Patient has had numerous falls.  She has not reported one recently.    History of hematuria Patient had a CT abdomen and pelvis wo contrast on 08/03/2015 which noted a stable hyperdense cyst in the left kidney.  A cystoscopy on 01/04/2016 which was negative.   She denies any gross hematuria.  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  She could not undergo a MRI of this area due to having a non-functioning pacemaker in place.  She underwent a pelvic ultrasound demonstrated a soft tissue thickening of the proximal urethra measuring up to 2 cm.  This may represent urethral diverticulum with inspissated debris versus true soft tissue mass.  This may be the cause of her hematuria.    History of recurrent UTI's Patient has had greater than > 4 documented UTI's over the last year.  Risk factors for UTI's are urethral diverticulum, advanced age, weekly instrumentation of bladder for rescue solutions, vaginal atrophy and incontinence.  She has just completed a course of Cipro for an E. Coli UTI.  She will now be placed on suppressive antibiotic therapy with trimethoprim daily.    Interstitial cystitis Patient presents today for a rescue solution.  She feels she is having improvement with the instillations.  She is complaining of frequency, urgency, dysuria, nocturia and incontinence.  She gets relief of her bladder pain for a few days after the solution is given.    Incontinence Patient currently on Myrbetriq 25 mg daily.  She feels they are providing benefit.  PVR is 0 mL at today's exam.   Urethral diverticulum Pelvic CT performed in 06/2016 and there were findings suspicious for an urethral diverticulum.   Unchanged focal density at the level of the proximal urethra favoring calcium/urolithiasis within diverticulum, less likely colovesicular fistula given stability.  Diverticulosis without acute diverticulitis.  She could not have a MRI due to an implanted non functional pacemaker.  She underwent a pelvic ultrasound demonstrated a soft tissue thickening of the proximal urethra measuring up to 2 cm.  This may represent urethral diverticulum with inspissated debris versus true soft tissue mass.  I have discussed her findings with Dr. Pilar Jarvis and Dr. Matilde Sprang.  Both have recommended suppressive antibiotics but do not recommend surgical intervention at this time due to her age and co-morbidities.    PMH: Past Medical History:  Diagnosis Date  . Anxiety and depression   . Breast cancer ([place]) 2015   LT LUMPECTOMY  . Carpal tunnel syndrome   . Chest pain    SECONDARY TO GERD  . Chronic interstitial cystitis   . Chronic right hip pain   . Depression   . Diabetes mellitus without complication ([place])   . Diverticulosis   . Dyslipidemia   . Essential hypertension, benign   . Fibromyalgia   . Frequent falls   . GERD (gastroesophageal reflux disease)   . Hearing loss of left ear   . History  of fractured rib   . History of TIA (transient ischemic attack)   . Irritable bowel   . MS (multiple sclerosis) ([place])   . Osteoarthritis   . Paroxysmal supraventricular tachycardia ([place])   . Peripheral neuropathy (H[facility])    MILD  . Personal history of radiation therapy 2015   BREAST CA  . Protein malnutrition ([place])   . Pure hypercholesterolemia   . Recurrent UTI   . Skin cancer   . SOB (shortness of breath)    SECONDARY TO REFLUX  . Solitary pulmonary nodule  on lung CT 12/04/2015   3 mm LLL lung nodule June 2016, March 2017  . Tachycardia, paroxysmal (HCC)    Reportedly Paroxysmal Supraventricular Tachycardia, but unable to find documentation confirming this  . Thyroid nodule   . Vertigo   . Vitamin B 12 deficiency     Surgical History: Past Surgical History:  Procedure Laterality Date  . APPENDECTOMY    . AUGMENTATION MAMMAPLASTY Right 1975   BREAST BREAST ONLY  . BLADDER TACKING     X 3  . BREAST LUMPECTOMY    . BREAST SURGERY    . CARDIAC CATHETERIZATION  10/2001   Normal coronary arteries with the exception of 20% proximal D1  . CHOLECYSTECTOMY    . COLONOSCOPY WITH PROPOFOL N/A 01/21/2015   Procedure: COLONOSCOPY WITH PROPOFOL;  Surgeon: Josefine Class, MD;  Location: [facility] ENDOSCOPY;  Service: Endoscopy;  Laterality: N/A;  . ESOPHAGOGASTRODUODENOSCOPY N/A 01/21/2015   Procedure: ESOPHAGOGASTRODUODENOSCOPY (EGD);  Surgeon: Josefine Class, MD;  Location: [facility] ENDOSCOPY;  Service: Endoscopy;  Laterality: N/A;  . EYE SURGERIES     WITH BUCKLE DETACHMENT OF THE RETINA  . heart monitor     Not being used... Patient can not have a MRI  . HEMORRHOID SURGERY     WITH RECONSTRUCTION  . MASTECTOMY Right 1970   SUBCUTANEOUS - NO BREAST CANCER  . TONSILLECTOMY    . TOTAL ABDOMINAL HYSTERECTOMY W/ BILATERAL SALPINGOOPHORECTOMY    . TYMPANOPLASTY      Home Medications:  Allergies as of 09/12/2016      Reactions   Aricept [donepezil Hcl] Other (See Comments)   hallucinations   Biaxin [clarithromycin] Other (See Comments), Diarrhea   Copaxone [glatiramer Acetate]    Decadron [dexamethasone] Nausea And Vomiting, Other (See Comments)   Other reaction(s): Muscle Pain Reaction:  Abdominal pain   Dexamethasone Sodium Phosphate Other (See Comments)   Diltiazem Hcl Other (See Comments)   Reaction:  Unknown    Diltiazem Hcl Other (See Comments)   Flagyl [metronidazole] Nausea Only, Diarrhea   Other reaction(s): Vomiting    Gabapentin Other (See Comments)   "burning all over" Reaction:  Unknown    Iohexol Swelling, Other (See Comments)    Desc: tongue swelling with "IVP dye" sccording to nurses notes with a lumbar myelo-12/09- asm, Onset Date: [id number]  Pts tongue swells.   Ivp Dye [iodinated Diagnostic Agents] Swelling, Other (See Comments)   Passed out  Pts tongue swells.    Levothyroxine Nausea Only   Macrobid [nitrofurantoin Macrocrystal] Nausea And Vomiting   Sulfa Antibiotics Other (See Comments)   Reaction:  Unknown    Sulfonamide Derivatives Other (See Comments)   Reaction:  Unknown    Synthroid [levothyroxine Sodium]    Copaxone  [glatiramer] Rash   Interferon Beta-1a Other (See Comments), Rash   Reaction:  Unknown       Medication List       Accurate as of 09/12/16 11:13 AM.  Always use your most recent med list.          acetaminophen 500 MG tablet Commonly known as:  TYLENOL Take by mouth.   aspirin EC 81 MG tablet Take 1 tablet (81 mg total) by mouth daily.   atorvastatin 20 MG tablet Commonly known as:  LIPITOR Take 1 tablet (20 mg total) by mouth at bedtime.   b complex vitamins tablet Take 1 tablet by mouth daily.   ciprofloxacin 500 MG tablet Commonly known as:  CIPRO Take 1 tablet (500 mg total) by mouth every 12 (twelve) hours.   diazepam 5 MG tablet Commonly known as:  VALIUM Take one tablet 30 minutes prior to MRI   dicyclomine 10 MG capsule Commonly known as:  BENTYL Take by mouth.   erythromycin ophthalmic ointment   ESTRACE VAGINAL 0.1 MG/GM vaginal cream Generic drug:  estradiol Place 1 Applicatorful vaginally at bedtime.   ferrous sulfate 325 (65 FE) MG tablet Take 1 tablet (325 mg total) by mouth daily with breakfast.   fluticasone 50 MCG/ACT nasal spray Commonly known as:  FLONASE SPRAY 2 SPRAYS EACH INTO BOTH NOSTRILS ONCE DAILY EACH NIGHT.   GAS-X EXTRA STRENGTH 125 MG Caps Generic drug:  Simethicone Take 1 capsule by mouth as needed.  Reported on 11/24/2015   GENTEAL OP Apply 1 drop to eye. Each eye as needed   [place] Rolling walker; knee sprain, arthritis; LON 99 months   hydrocortisone 1 % Crea Commonly known as:  PROCTO-PAK Apply to rectum twice a day   levofloxacin 250 MG tablet Commonly known as:  LEVAQUIN Take 1 tablet (250 mg total) by mouth daily.   loperamide 2 MG tablet Commonly known as:  IMODIUM A-D Take 2 mg by mouth 4 (four) times daily as needed for diarrhea or loose stools.   meclizine 25 MG tablet Commonly known as:  ANTIVERT Take 1 tablet (25 mg total) by mouth every 8 (eight) hours as needed for dizziness.   memantine 10 MG tablet Commonly known as:  NAMENDA   metFORMIN 500 MG tablet Commonly known as:  GLUCOPHAGE Take 1 tablet (500 mg total) by mouth 2 (two) times daily with a meal.   MYRBETRIQ 25 MG Tb24 tablet Generic drug:  mirabegron ER Take 25 mg by mouth daily.   neomycin-polymyxin-hydrocortisone 3.5-10000-1 otic suspension Commonly known as:  CORTISPORIN Reported on 01/05/2016   nystatin cream Commonly known as:  MYCOSTATIN Apply 1 application topically 2 (two) times daily.   omeprazole 40 MG capsule Commonly known as:  PRILOSEC Take 40 mg by mouth daily.   ondansetron 4 MG tablet Commonly known as:  ZOFRAN Take 4 mg by mouth every 8 (eight) hours as needed for nausea or vomiting. Reported on 12/22/2015   oxycodone-acetaminophen 2.5-325 MG tablet Commonly known as:  PERCOCET Take 1 tablet by mouth every 4 (four) hours as needed for pain.   polyethylene glycol powder powder Commonly known as:  GLYCOLAX/MIRALAX Take by mouth.   PROBIOTIC COLON SUPPORT Caps Take 1 capsule by mouth daily.   sertraline 50 MG tablet Commonly known as:  ZOLOFT Take 1 tablet (50 mg total) by mouth daily.   traMADol 50 MG tablet Commonly known as:  ULTRAM Take 1 tablet (50 mg total) by mouth every 6 (six) hours as needed.   trimethoprim 100 MG  tablet Commonly known as:  TRIMPEX Take 1 tablet (100 mg total) by mouth daily.   trimethoprim 100 MG tablet Commonly known as:  TRIMPEX Take 1 tablet (100 mg total) by mouth daily.   Vitamin B-12 1000 MCG Subl   Vitamin D3 50000 units Caps Take 5,000 Int'l Units/day by mouth once a week. Reported on 01/25/2016       Allergies:  Allergies  Allergen Reactions  . Aricept [Donepezil Hcl] Other (See Comments)    hallucinations  . Biaxin [Clarithromycin] Other (See Comments) and Diarrhea  . Copaxone [Glatiramer Acetate]   . Decadron [Dexamethasone] Nausea And Vomiting and Other (See Comments)    Other reaction(s): Muscle Pain Reaction:  Abdominal pain  . Dexamethasone Sodium Phosphate Other (See Comments)  . Diltiazem Hcl Other (See Comments)    Reaction:  Unknown   . Diltiazem Hcl Other (See Comments)  . Flagyl [Metronidazole] Nausea Only and Diarrhea    Other reaction(s): Vomiting  . Gabapentin Other (See Comments)    "burning all over" Reaction:  Unknown   . Iohexol Swelling and Other (See Comments)     Desc: tongue swelling with "IVP dye" sccording to nurses notes with a lumbar myelo-12/09- asm, Onset Date: [id number]  Pts tongue swells.  Ardine Bjork [Iodinated Diagnostic Agents] Swelling and Other (See Comments)    Passed out  Pts tongue swells.   . Levothyroxine Nausea Only  . Macrobid [Nitrofurantoin Macrocrystal] Nausea And Vomiting  . Sulfa Antibiotics Other (See Comments)    Reaction:  Unknown   . Sulfonamide Derivatives Other (See Comments)    Reaction:  Unknown   . Synthroid [Levothyroxine Sodium]   . Copaxone  [Glatiramer] Rash  . Interferon Beta-1a Other (See Comments) and Rash    Reaction:  Unknown     Family History: Family History  Problem Relation Age of Onset  . ALS Mother   . Kidney disease Sister   . Arthritis Sister   . Aneurysm Brother   . Heart disease Daughter   . Colon cancer    . Breast cancer Neg Hx   . Bladder Cancer Neg Hx   .  Prostate cancer Neg Hx     Social History:  reports that she has never smoked. She has never used smokeless tobacco. She reports that she does not drink alcohol or use drugs.  ROS: UROLOGY Frequent Urination?: No Hard to postpone urination?: No Burning/pain with urination?: Yes Get up at night to urinate?: Yes Leakage of urine?: No Urine stream starts and stops?: No Trouble starting stream?: No Do you have to strain to urinate?: No Blood in urine?: No Urinary tract infection?: No Sexually transmitted disease?: No Injury to kidneys or bladder?: No Painful intercourse?: No Weak stream?: No Currently pregnant?: No Vaginal bleeding?: No Last menstrual period?: n  Gastrointestinal Nausea?: No Vomiting?: No Indigestion/heartburn?: No Diarrhea?: No Constipation?: No  Constitutional Fever: No Night sweats?: Yes Weight loss?: No Fatigue?: Yes  Skin Skin rash/lesions?: No Itching?: No  Eyes Blurred vision?: Yes Double vision?: No  Ears/Nose/Throat Sore throat?: Yes Sinus problems?: Yes  Hematologic/Lymphatic Swollen glands?: No Easy bruising?: No  Cardiovascular Leg swelling?: No Chest pain?: No  Respiratory Cough?: No Shortness of breath?: No  Endocrine Excessive thirst?: No  Musculoskeletal Back pain?: Yes Joint pain?: Yes  Neurological Headaches?: Yes Dizziness?: Yes  Psychologic Depression?: Yes Anxiety?: Yes  Physical Exam: Blood pressure 135/84, pulse 74, height 5\' 3"  (1.6 m), weight 149 lb (67.6 kg). Constitutional: Well nourished. Alert and oriented, No acute distress. HEENT: [employer] AT, moist mucus membranes. Trachea midline, no masses. Cardiovascular: No clubbing, cyanosis, or edema. Respiratory: Normal respiratory effort, no  increased work of breathing. GI: Abdomen is soft, non tender, non distended, no abdominal masses.  GU: No CVA tenderness.  No bladder fullness or masses.  Atrophic external genitalia, normal pubic hair  distribution, no lesions.  Normal urethral meatus, no lesions, no prolapse, no discharge.   No urethral masses, tenderness and/or tenderness. No bladder fullness, tenderness or masses. Pelvic tenderness on palpation, but no mass palpated.  Pale vagina mucosa, poor estrogen effect, no discharge, no lesions, good pelvic support, no cystocele or rectocele noted.  Cervix, uterus and adnexa are surgically absent.  Anus and perineum are without rashes or lesions.    Skin: No rashes, bruises or suspicious lesions. Lymph: No cervical or inguinal adenopathy. Neurologic: Grossly intact, no focal deficits, moving all 4 extremities. Psychiatric: Normal mood and affect.  Laboratory Data: Lab Results  Component Value Date   WBC 4.1 08/28/2016   HGB 13.9 08/28/2016   HCT 40.5 08/28/2016   MCV 91.4 08/28/2016   PLT 196 08/28/2016   Lab Results  Component Value Date   CREATININE 1.08 (H) 08/28/2016   Lab Results  Component Value Date   HGBA1C 6.5 (H) 04/03/2016   Urinalysis 11-30 WBC's.  3-10 RBC's.  See Epic.     Assessment & Plan:    1. Interstitial cystitis  - Patient's rescue installation is performed today   - Unsure if her pelvic pain is due to IC or diverticulum- refer to PT for further evaluation   2. Recurrent UTI's  - urine culture sent for culture  - on suppressive antibiotic therapy due to diverticulum- trimethoprim 100 mg daily    3. Microscopic hematuria  - Work up completed in 2017.  No malignancies discovered.    - UA checked yearly for hematuria  - No recent gross hematuria.   - Possible urethral diverticulum seen on CT performed on 07/02/2016 and pelvic US   4. Falling  - Continue to assist the patient with transfers to table   - Referral to PT   5. Vaginal atrophy  - patient will continue the vaginal estrogen cream nightly for one more week, then switch to three nights weekly  6. Incontinence  - patient currently taking Myrbetriq 25 mg daily  7. Urethral  diverticulum  - patient cannot have MRI due to the fact she has a non functional pacemaker still implanted  - CT of pelvis and  transvaginal ultrasound is suspicious for an urethral diverticulum  - reviewed patient's case with Dr. Matilde Sprang and Dr. Pilar Jarvis, they do not recommend any surgical intervention at this time and to continue the suppressive antibiotic at this time  Return in about 1 week (around 09/19/2016) for rescue solution.  Zara Council, [facility] [address] [place], [place] [address] [phone]

## 2016-09-12 NOTE — Progress Notes (Signed)
Bladder Rescue Solution Instillation  Due to interstitial cystitis patient is present today for a Rescue Solution Treatment.  Patient was cleaned and prepped in a sterile fashion prior to rescue solution to get urine specimen by Donnie Mesa.  A 14FR catheter was inserted, urine return was noted 69ml, urine was yellow in color.  Instilled a solution consisting of 81ml of Sodium Bicarb, 2 ml Lidocaine and 1 ml of Heparin. The catheter was then removed. Patient tolerated well, no complications were noted.   Performed by: Lyndee Hensen CMA

## 2016-09-16 NOTE — Assessment & Plan Note (Signed)
Has been a chronic issue, noted in problem list with previous primary back on 09/02/2015, "likely due to scar tissue"

## 2016-09-16 NOTE — Assessment & Plan Note (Signed)
Managed by urologist; not sure if this is part of her pain but encouraged her to keep f/u with urology

## 2016-09-16 NOTE — Assessment & Plan Note (Signed)
Managed by surgeon; hydrocortisone topically until she can be seen

## 2016-09-18 ENCOUNTER — Ambulatory Visit: Payer: Medicare HMO | Admitting: General Surgery

## 2016-09-19 ENCOUNTER — Ambulatory Visit: Payer: Medicare HMO | Admitting: Urology

## 2016-09-19 ENCOUNTER — Ambulatory Visit: Admission: RE | Admit: 2016-09-19 | Payer: Commercial Managed Care - HMO | Source: Ambulatory Visit

## 2016-09-19 ENCOUNTER — Ambulatory Visit
Admission: RE | Admit: 2016-09-19 | Discharge: 2016-09-19 | Disposition: A | Discharge status: Home / Self Care | Payer: Medicare HMO | Source: Ambulatory Visit | Attending: General Surgery | Admitting: General Surgery

## 2016-09-19 DIAGNOSIS — N8110 Cystocele, unspecified: Secondary | ICD-10-CM | POA: Insufficient documentation

## 2016-09-19 DIAGNOSIS — K6289 Other specified diseases of anus and rectum: Secondary | ICD-10-CM | POA: Diagnosis not present

## 2016-09-19 DIAGNOSIS — K5730 Diverticulosis of large intestine without perforation or abscess without bleeding: Secondary | ICD-10-CM | POA: Diagnosis not present

## 2016-09-19 DIAGNOSIS — Z9889 Other specified postprocedural states: Secondary | ICD-10-CM | POA: Diagnosis not present

## 2016-09-19 DIAGNOSIS — K623 Rectal prolapse: Secondary | ICD-10-CM | POA: Insufficient documentation

## 2016-09-21 ENCOUNTER — Telehealth: Payer: Self-pay

## 2016-09-21 NOTE — Telephone Encounter (Signed)
Pt called stating she was under the impression that she was supposed to be on a daily abx but when she went to pick it up at the pharmacy [facility] would not [facility]. [facility] called [facility] who stated pt picked up prophylactic abx back in jan therefore insurance will not cover medication until 2/24. Spoke with pt and made aware. Pt became very confused and did not understand any longer of what was being said. Reinforced with pt to continue to drink plenty of water and f/u with [facility] on Monday. Pt voiced understanding.

## 2016-09-24 ENCOUNTER — Encounter: Payer: Self-pay | Admitting: Urology

## 2016-09-24 ENCOUNTER — Ambulatory Visit (INDEPENDENT_AMBULATORY_CARE_PROVIDER_SITE_OTHER): Payer: Medicare HMO | Admitting: Urology

## 2016-09-24 VITALS — BP 154/75 | HR 70 | Ht 59.0 in | Wt 151.5 lb

## 2016-09-24 DIAGNOSIS — R296 Repeated falls: Secondary | ICD-10-CM | POA: Diagnosis not present

## 2016-09-24 DIAGNOSIS — R32 Unspecified urinary incontinence: Secondary | ICD-10-CM

## 2016-09-24 DIAGNOSIS — N390 Urinary tract infection, site not specified: Secondary | ICD-10-CM | POA: Diagnosis not present

## 2016-09-24 DIAGNOSIS — N361 Urethral diverticulum: Secondary | ICD-10-CM | POA: Diagnosis not present

## 2016-09-24 DIAGNOSIS — R3129 Other microscopic hematuria: Secondary | ICD-10-CM | POA: Diagnosis not present

## 2016-09-24 DIAGNOSIS — N3010 Interstitial cystitis (chronic) without hematuria: Secondary | ICD-10-CM

## 2016-09-24 DIAGNOSIS — N952 Postmenopausal atrophic vaginitis: Secondary | ICD-10-CM | POA: Diagnosis not present

## 2016-09-24 LAB — MICROSCOPIC EXAMINATION

## 2016-09-24 LAB — URINALYSIS, COMPLETE
BILIRUBIN UA: NEGATIVE
Glucose, UA: NEGATIVE
Nitrite, UA: NEGATIVE
PH UA: 5 (ref 5.0–7.5)
SPEC GRAV UA: 1.025 (ref 1.005–1.030)
Urobilinogen, Ur: 0.2 mg/dL (ref 0.2–1.0)

## 2016-09-24 MED ORDER — SODIUM BICARBONATE 8.4 % IV SOLN
11.0000 mL | Freq: Once | INTRAVENOUS | Status: AC
  Administered 2016-09-24: 11 mL

## 2016-09-24 NOTE — Progress Notes (Signed)
11:01 AM   [name] Aug 05, 1930 [id number]  Referring provider: Arnetha Courser, MD [address] [place], [place] [address]  Chief Complaint  Patient presents with  . Follow-up    IC    HPI: Patient is an 83 year old Caucasian female with a history of IC, fall risk, a history of hematuria, incontinence, urethral diverticulum and a history of recurrent UTI's who presents today for a rescue installation.     Fall risk Patient has had numerous falls.  She has not reported one recently.    History of hematuria Patient had a CT abdomen and pelvis wo contrast on 08/03/2015 which noted a stable hyperdense cyst in the left kidney.  A cystoscopy on 01/04/2016 which was negative.   She denies any gross hematuria.  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  She could not undergo a MRI of this area due to having a non-functioning pacemaker in place.  She underwent a pelvic ultrasound demonstrated a soft tissue thickening of the proximal urethra measuring up to 2 cm.  This may represent urethral diverticulum with inspissated debris versus true soft tissue mass.  This may be the cause of her hematuria.    History of recurrent UTI's Patient has had greater than > 4 documented UTI's over the last year.  Risk factors for UTI's are urethral diverticulum, advanced age, weekly instrumentation of bladder for rescue solutions, vaginal atrophy and incontinence.  She has just completed a course of Cipro for an E. Coli UTI.  She will now be placed on suppressive antibiotic therapy with trimethoprim daily.  Patient had contacted the office regarding her antibiotics and became confused regarding the information given to her.   I have called her pharmacy and she has filled the trimethoprim and will be getting a refill today.  Her UA today was unremarkable.    Interstitial cystitis Patient presents today for a rescue solution.  She  feels she is having improvement with the instillations.  She is complaining of frequency, urgency, dysuria, nocturia and incontinence.  She gets relief of her bladder pain for a few days after the solution is given.   Incontinence Patient no longer taking the Myrbetriq 25 mg daily.  PVR is 100 mL at today's exam.  I have contacted her pharmacy and she has not taken the Myrbetriq for the last 6 months.    Urethral diverticulum Pelvic CT performed in 06/2016 and there were findings suspicious for an urethral diverticulum.   Unchanged focal density at the level of the proximal urethra favoring calcium/urolithiasis within diverticulum, less likely colovesicular fistula given stability.  Diverticulosis without acute diverticulitis.  She could not have a MRI due to an implanted non functional pacemaker.  She underwent a pelvic ultrasound demonstrated a soft tissue thickening of the proximal urethra measuring up to 2 cm.  This may represent urethral diverticulum with inspissated debris versus true soft tissue mass.  I have discussed her findings with Dr. Pilar Jarvis and Dr. Matilde Sprang.  Both have recommended suppressive antibiotics but do not recommend surgical intervention at this time due to her age and co-morbidities.    PMH: Past Medical History:  Diagnosis Date  . Anxiety and depression   . Breast cancer ([place]) 2015   LT LUMPECTOMY  . Carpal tunnel syndrome   . Chest pain    SECONDARY TO GERD  . Chronic interstitial cystitis   . Chronic right hip  pain   . Depression   . Diabetes mellitus without complication ([place])   . Diverticulosis   . Dyslipidemia   . Essential hypertension, benign   . Fibromyalgia   . Frequent falls   . GERD (gastroesophageal reflux disease)   . Hearing loss of left ear   . History of fractured rib   . History of TIA (transient ischemic attack)   . Irritable bowel   . MS (multiple sclerosis) ([facility])   . Osteoarthritis   . Paroxysmal supraventricular tachycardia ([place] [facility])   .  Peripheral neuropathy ([facility])    MILD  . Personal history of radiation therapy 2015   BREAST CA  . Protein malnutrition ([facility])   . Pure hypercholesterolemia   . Recurrent UTI   . Skin cancer   . SOB (shortness of breath)    SECONDARY TO REFLUX  . Solitary pulmonary nodule on lung CT 12/04/2015   3 mm LLL lung nodule June 2016, March 2017  . Tachycardia, paroxysmal ([facility])    Reportedly Paroxysmal Supraventricular Tachycardia, but unable to find documentation confirming this  . Thyroid nodule   . Vertigo   . Vitamin B 12 deficiency     Surgical History: Past Surgical History:  Procedure Laterality Date  . APPENDECTOMY    . AUGMENTATION MAMMAPLASTY Right 1975   BREAST BREAST ONLY  . BLADDER TACKING     X 3  . BREAST LUMPECTOMY    . BREAST SURGERY    . CARDIAC CATHETERIZATION  10/2001   Normal coronary arteries with the exception of 20% proximal D1  . CHOLECYSTECTOMY    . COLONOSCOPY WITH PROPOFOL N/A 01/21/2015   Procedure: COLONOSCOPY WITH PROPOFOL;  Surgeon: Josefine Class, MD;  Location: [facility] ENDOSCOPY;  Service: Endoscopy;  Laterality: N/A;  . ESOPHAGOGASTRODUODENOSCOPY N/A 01/21/2015   Procedure: ESOPHAGOGASTRODUODENOSCOPY (EGD);  Surgeon: Josefine Class, MD;  Location: [facility] ENDOSCOPY;  Service: Endoscopy;  Laterality: N/A;  . EYE SURGERIES     WITH BUCKLE DETACHMENT OF THE RETINA  . heart monitor     Not being used... Patient can not have a MRI  . HEMORRHOID SURGERY     WITH RECONSTRUCTION  . MASTECTOMY Right 1970   SUBCUTANEOUS - NO BREAST CANCER  . TONSILLECTOMY    . TOTAL ABDOMINAL HYSTERECTOMY W/ BILATERAL SALPINGOOPHORECTOMY    . TYMPANOPLASTY      Home Medications:  Allergies as of 09/24/2016      Reactions   Aricept [donepezil Hcl] Other (See Comments)   hallucinations   Biaxin [clarithromycin] Other (See Comments), Diarrhea   Copaxone [glatiramer Acetate]    Decadron [dexamethasone] Nausea And Vomiting, Other (See Comments)   Other reaction(s):  Muscle Pain Reaction:  Abdominal pain   Dexamethasone Sodium Phosphate Other (See Comments)   Diltiazem Hcl Other (See Comments)   Reaction:  Unknown    Diltiazem Hcl Other (See Comments)   Flagyl [metronidazole] Nausea Only, Diarrhea   Other reaction(s): Vomiting   Gabapentin Other (See Comments)   "burning all over" Reaction:  Unknown    Iohexol Swelling, Other (See Comments)    Desc: tongue swelling with "IVP dye" sccording to nurses notes with a lumbar myelo-12/09- asm, Onset Date: [id number]  Pts tongue swells.   Ivp Dye [iodinated Diagnostic Agents] Swelling, Other (See Comments)   Passed out  Pts tongue swells.    Levothyroxine Nausea Only   Macrobid [nitrofurantoin Macrocrystal] Nausea And Vomiting   Sulfa Antibiotics Other (See Comments)   Reaction:  Unknown    Sulfonamide  Derivatives Other (See Comments)   Reaction:  Unknown    Synthroid [levothyroxine Sodium]    Copaxone  [glatiramer] Rash   Interferon Beta-1a Other (See Comments), Rash   Reaction:  Unknown       Medication List       Accurate as of 09/24/16 11:01 AM. Always use your most recent med list.          acetaminophen 500 MG tablet Commonly known as:  TYLENOL Take by mouth.   aspirin EC 81 MG tablet Take 1 tablet (81 mg total) by mouth daily.   atorvastatin 20 MG tablet Commonly known as:  LIPITOR Take 1 tablet (20 mg total) by mouth at bedtime.   b complex vitamins tablet Take 1 tablet by mouth daily.   ciprofloxacin 500 MG tablet Commonly known as:  CIPRO Take 1 tablet (500 mg total) by mouth every 12 (twelve) hours.   diazepam 5 MG tablet Commonly known as:  VALIUM Take one tablet 30 minutes prior to MRI   dicyclomine 10 MG capsule Commonly known as:  BENTYL Take by mouth.   erythromycin ophthalmic ointment   ESTRACE VAGINAL 0.1 MG/GM vaginal cream Generic drug:  estradiol Place 1 Applicatorful vaginally at bedtime.   ferrous sulfate 325 (65 FE) MG tablet Take 1 tablet (325 mg  total) by mouth daily with breakfast.   fluticasone 50 MCG/ACT nasal spray Commonly known as:  FLONASE SPRAY 2 SPRAYS EACH INTO BOTH NOSTRILS ONCE DAILY EACH NIGHT.   GAS-X EXTRA STRENGTH 125 MG Caps Generic drug:  Simethicone Take 1 capsule by mouth as needed. Reported on 11/24/2015   GENTEAL OP Apply 1 drop to eye. Each eye as needed   Pioneer Rolling walker; knee sprain, arthritis; LON 99 months   hydrocortisone 1 % Crea Commonly known as:  PROCTO-PAK Apply to rectum twice a day   levofloxacin 250 MG tablet Commonly known as:  LEVAQUIN Take 1 tablet (250 mg total) by mouth daily.   loperamide 2 MG tablet Commonly known as:  IMODIUM A-D Take 2 mg by mouth 4 (four) times daily as needed for diarrhea or loose stools.   meclizine 25 MG tablet Commonly known as:  ANTIVERT Take 1 tablet (25 mg total) by mouth every 8 (eight) hours as needed for dizziness.   memantine 10 MG tablet Commonly known as:  NAMENDA   metFORMIN 500 MG tablet Commonly known as:  GLUCOPHAGE Take 1 tablet (500 mg total) by mouth 2 (two) times daily with a meal.   MYRBETRIQ 25 MG Tb24 tablet Generic drug:  mirabegron ER Take 25 mg by mouth daily.   neomycin-polymyxin-hydrocortisone 3.5-10000-1 otic suspension Commonly known as:  CORTISPORIN Reported on 01/05/2016   nystatin cream Commonly known as:  MYCOSTATIN Apply 1 application topically 2 (two) times daily.   omeprazole 40 MG capsule Commonly known as:  PRILOSEC Take 40 mg by mouth daily.   ondansetron 4 MG tablet Commonly known as:  ZOFRAN Take 4 mg by mouth every 8 (eight) hours as needed for nausea or vomiting. Reported on 12/22/2015   oxycodone-acetaminophen 2.5-325 MG tablet Commonly known as:  PERCOCET Take 1 tablet by mouth every 4 (four) hours as needed for pain.   polyethylene glycol powder powder Commonly known as:  GLYCOLAX/MIRALAX Take by mouth.   PROBIOTIC COLON SUPPORT Caps Take 1 capsule by  mouth daily.   sertraline 50 MG tablet Commonly known as:  ZOLOFT Take 1 tablet (50 mg total) by mouth daily.  traMADol 50 MG tablet Commonly known as:  ULTRAM Take 1 tablet (50 mg total) by mouth every 6 (six) hours as needed.   trimethoprim 100 MG tablet Commonly known as:  TRIMPEX Take 1 tablet (100 mg total) by mouth daily.   trimethoprim 100 MG tablet Commonly known as:  TRIMPEX Take 1 tablet (100 mg total) by mouth daily.   Vitamin B-12 1000 MCG Subl   Vitamin D3 50000 units Caps Take 5,000 Int'l Units/day by mouth once a week. Reported on 01/25/2016       Allergies:  Allergies  Allergen Reactions  . Aricept [Donepezil Hcl] Other (See Comments)    hallucinations  . Biaxin [Clarithromycin] Other (See Comments) and Diarrhea  . Copaxone [Glatiramer Acetate]   . Decadron [Dexamethasone] Nausea And Vomiting and Other (See Comments)    Other reaction(s): Muscle Pain Reaction:  Abdominal pain  . Dexamethasone Sodium Phosphate Other (See Comments)  . Diltiazem Hcl Other (See Comments)    Reaction:  Unknown   . Diltiazem Hcl Other (See Comments)  . Flagyl [Metronidazole] Nausea Only and Diarrhea    Other reaction(s): Vomiting  . Gabapentin Other (See Comments)    "burning all over" Reaction:  Unknown   . Iohexol Swelling and Other (See Comments)     Desc: tongue swelling with "IVP dye" sccording to nurses notes with a lumbar myelo-12/09- asm, Onset Date: [id number]  Pts tongue swells.  Clementeen Hoof [Iodinated Diagnostic Agents] Swelling and Other (See Comments)    Passed out  Pts tongue swells.   . Levothyroxine Nausea Only  . Macrobid [Nitrofurantoin Macrocrystal] Nausea And Vomiting  . Sulfa Antibiotics Other (See Comments)    Reaction:  Unknown   . Sulfonamide Derivatives Other (See Comments)    Reaction:  Unknown   . Synthroid [Levothyroxine Sodium]   . Copaxone  [Glatiramer] Rash  . Interferon Beta-1a Other (See Comments) and Rash    Reaction:  Unknown      Family History: Family History  Problem Relation Age of Onset  . ALS Mother   . Kidney disease Sister   . Arthritis Sister   . Aneurysm Brother   . Heart disease Daughter   . Colon cancer    . Breast cancer Neg Hx   . Bladder Cancer Neg Hx   . Prostate cancer Neg Hx     Social History:  reports that she has never smoked. She has never used smokeless tobacco. She reports that she does not drink alcohol or use drugs.  ROS: UROLOGY Frequent Urination?: No Hard to postpone urination?: Yes Burning/pain with urination?: Yes Get up at night to urinate?: Yes Leakage of urine?: No Urine stream starts and stops?: No Trouble starting stream?: No Do you have to strain to urinate?: No Blood in urine?: No Urinary tract infection?: No Sexually transmitted disease?: No Injury to kidneys or bladder?: No Painful intercourse?: No Weak stream?: No Currently pregnant?: No Vaginal bleeding?: No Last menstrual period?: n  Gastrointestinal Nausea?: No Vomiting?: No Indigestion/heartburn?: No Diarrhea?: Yes Constipation?: Yes  Constitutional Fever: No Night sweats?: Yes Weight loss?: No Fatigue?: Yes  Skin Skin rash/lesions?: No Itching?: No  Eyes Blurred vision?: Yes Double vision?: No  Ears/Nose/Throat Sore throat?: No Sinus problems?: Yes  Hematologic/Lymphatic Swollen glands?: No Easy bruising?: No  Cardiovascular Leg swelling?: No Chest pain?: No  Respiratory Cough?: No Shortness of breath?: No  Endocrine Excessive thirst?: No  Musculoskeletal Back pain?: Yes Joint pain?: Yes  Neurological Headaches?: Yes Dizziness?: Yes  Psychologic Depression?: Yes Anxiety?: Yes  Physical Exam: Blood pressure 135/84, pulse 74, height 5' 3"  (1.6 m), weight 149 lb (67.6 kg). Constitutional: Well nourished. Alert and oriented, No acute distress. HEENT: [place] AT, moist mucus membranes. Trachea midline, no masses. Cardiovascular: No clubbing, cyanosis, or  edema. Respiratory: Normal respiratory effort, no increased work of breathing. Skin: No rashes, bruises or suspicious lesions. Lymph: No cervical or inguinal adenopathy. Neurologic: Grossly intact, no focal deficits, moving all 4 extremities. Psychiatric: Normal mood and affect.  Laboratory Data: Lab Results  Component Value Date   WBC 4.1 08/28/2016   HGB 13.9 08/28/2016   HCT 40.5 08/28/2016   MCV 91.4 08/28/2016   PLT 196 08/28/2016   Lab Results  Component Value Date   CREATININE 1.08 (H) 08/28/2016   Lab Results  Component Value Date   HGBA1C 6.5 (H) 04/03/2016   Urinalysis Unremarkable.  See Epic.     Assessment & Plan:    1. Interstitial cystitis  - Patient's rescue installation is performed today   - Unsure if her pelvic pain is due to IC or diverticulum- refer to PT for further evaluation - patient cannot afford the co-pays  2. Recurrent UTI's  - urine culture sent for culture  - on suppressive antibiotic therapy due to diverticulum- trimethoprim 100 mg daily    3. Microscopic hematuria  - Work up completed in 2017.  No malignancies discovered.    - UA checked yearly for hematuria  - No recent gross hematuria.   - Possible urethral diverticulum seen on CT performed on 07/02/2016 and pelvic US   4. Falling  - Continue to assist the patient with transfers to table   - Referral to PT - cannot afford the co pays  5. Vaginal atrophy  - she is not longer using the vaginal cream  - advised to olive/coconut oil for discomfort  6. Incontinence  - patient is not currently taking Myrbetriq 25 mg daily  - she does not consider incontinence a bother at this time  7. Urethral diverticulum  - patient cannot have MRI due to the fact she has a non functional pacemaker still implanted  - CT of pelvis and  transvaginal ultrasound is suspicious for an urethral diverticulum  - reviewed patient's case with Dr. Matilde Sprang and Dr. Pilar Jarvis, they do not recommend any  surgical intervention at this time and to continue the suppressive antibiotic at this time  Return in about 1 week (around 10/01/2016) for rescue solution.  Zara Council, [facility] [address], [place],  [address] [phone]

## 2016-09-24 NOTE — Progress Notes (Signed)
Bladder Rescue Solution Instillation  Due to Interstitial Cystitis patient is present today for a Rescue Solution Treatment.  Patient was cleaned and prepped in a sterile fashion with betadine and lidocaine 2% jelly was instilled into the urethra.  A 14 FR catheter was inserted, urine return was noted 115ml, urine was yellow in color.  Instilled a solution consisting of 44ml of Sodium Bicarb, 2 ml Lidocaine and 1 ml of Heparin. The catheter was then removed. Patient tolerated well, no complications were noted.   Performed by: Elberta Leatherwood, CMA  Follow up/ Additional Notes: 1 week

## 2016-09-26 ENCOUNTER — Encounter: Payer: Self-pay | Admitting: General Surgery

## 2016-09-26 ENCOUNTER — Ambulatory Visit (INDEPENDENT_AMBULATORY_CARE_PROVIDER_SITE_OTHER): Payer: Medicare HMO | Admitting: General Surgery

## 2016-09-26 VITALS — BP 122/74 | HR 70 | Resp 12 | Ht 62.0 in | Wt 153.0 lb

## 2016-09-26 DIAGNOSIS — K6289 Other specified diseases of anus and rectum: Secondary | ICD-10-CM

## 2016-09-26 NOTE — Progress Notes (Signed)
Patient ID: [name], female   DOB: 1930/04/28, 83 y.o.   MRN: [id number]  Chief Complaint  Patient presents with  . Other    ct scan     HPI [name] is a 83 y.o. female here today for her follow up from a ct scan done on 09/19/2016. She reports rectal pain. No other pertinent symptoms reported. I have reviewed the history of present illness with the patient. HPI  Past Medical History:  Diagnosis Date  . Anxiety and depression   . Breast cancer ([place]) 2015   LT LUMPECTOMY  . Carpal tunnel syndrome   . Chest pain    SECONDARY TO GERD  . Chronic interstitial cystitis   . Chronic right hip pain   . Depression   . Diabetes mellitus without complication ([place])   . Diverticulosis   . Dyslipidemia   . Essential hypertension, benign   . Fibromyalgia   . Frequent falls   . GERD (gastroesophageal reflux disease)   . Hearing loss of left ear   . History of fractured rib   . History of TIA (transient ischemic attack)   . Irritable bowel   . MS (multiple sclerosis) ([place])   . Osteoarthritis   . Paroxysmal supraventricular tachycardia ([place])   . Peripheral neuropathy (HCC)    MILD  . Personal history of radiation therapy 2015   BREAST CA  . Protein malnutrition ([place])   . Pure hypercholesterolemia   . Recurrent UTI   . Skin cancer   . SOB (shortness of breath)    SECONDARY TO REFLUX  . Solitary pulmonary nodule on lung CT 12/04/2015   3 mm LLL lung nodule June 2016, March 2017  . Tachycardia, paroxysmal (HCC)    Reportedly Paroxysmal Supraventricular Tachycardia, but unable to find documentation confirming this  . Thyroid nodule   . Vertigo   . Vitamin B 12 deficiency     Past Surgical History:  Procedure Laterality Date  . APPENDECTOMY    . AUGMENTATION MAMMAPLASTY Right 1975   BREAST BREAST ONLY  . BLADDER TACKING     X 3  . BREAST LUMPECTOMY    . BREAST SURGERY    . CARDIAC CATHETERIZATION  10/2001   Normal coronary arteries with the exception of 20%  proximal D1  . CHOLECYSTECTOMY    . COLONOSCOPY WITH PROPOFOL N/A 01/21/2015   Procedure: COLONOSCOPY WITH PROPOFOL;  Surgeon: Josefine Class, MD;  Location: [facility] ENDOSCOPY;  Service: Endoscopy;  Laterality: N/A;  . ESOPHAGOGASTRODUODENOSCOPY N/A 01/21/2015   Procedure: ESOPHAGOGASTRODUODENOSCOPY (EGD);  Surgeon: Josefine Class, MD;  Location: [facility]OSCOPY;  Service: Endoscopy;  Laterality: N/A;  . EYE SURGERIES     WITH BUCKLE DETACHMENT OF THE RETINA  . heart monitor     Not being used... Patient can not have a MRI  . HEMORRHOID SURGERY     WITH RECONSTRUCTION  . MASTECTOMY Right 1970   SUBCUTANEOUS - NO BREAST CANCER  . TONSILLECTOMY    . TOTAL ABDOMINAL HYSTERECTOMY W/ BILATERAL SALPINGOOPHORECTOMY    . TYMPANOPLASTY      Family History  Problem Relation Age of Onset  . ALS Mother   . Kidney disease Sister   . Arthritis Sister   . Aneurysm Brother   . Heart disease Daughter   . Colon cancer    . Breast cancer Neg Hx   . Bladder Cancer Neg Hx   . Prostate cancer Neg Hx     Social History Social History  Substance Use Topics  . Smoking status: Never Smoker  . Smokeless tobacco: Never Used  . Alcohol use No    Allergies  Allergen Reactions  . Aricept [Donepezil Hcl] Other (See Comments)    hallucinations  . Biaxin [Clarithromycin] Other (See Comments) and Diarrhea  . Copaxone [Glatiramer Acetate]   . Decadron [Dexamethasone] Nausea And Vomiting and Other (See Comments)    Other reaction(s): Muscle Pain Reaction:  Abdominal pain  . Dexamethasone Sodium Phosphate Other (See Comments)  . Diltiazem Hcl Other (See Comments)    Reaction:  Unknown   . Diltiazem Hcl Other (See Comments)  . Flagyl [Metronidazole] Nausea Only and Diarrhea    Other reaction(s): Vomiting  . Gabapentin Other (See Comments)    "burning all over" Reaction:  Unknown   . Iohexol Swelling and Other (See Comments)     Desc: tongue swelling with "IVP dye" sccording to nurses notes  with a lumbar myelo-12/09- asm, Onset Date: [id number]  Pts tongue swells.  Clementeen Hoof [Iodinated Diagnostic Agents] Swelling and Other (See Comments)    Passed out  Pts tongue swells.   . Levothyroxine Nausea Only  . Macrobid [Nitrofurantoin Macrocrystal] Nausea And Vomiting  . Sulfa Antibiotics Other (See Comments)    Reaction:  Unknown   . Sulfonamide Derivatives Other (See Comments)    Reaction:  Unknown   . Synthroid [Levothyroxine Sodium]   . Copaxone  [Glatiramer] Rash  . Interferon Beta-1a Other (See Comments) and Rash    Reaction:  Unknown     Current Outpatient Prescriptions  Medication Sig Dispense Refill  . acetaminophen (TYLENOL) 500 MG tablet Take by mouth.    Marland Kitchen aspirin EC 81 MG tablet Take 1 tablet (81 mg total) by mouth daily. 30 tablet 11  . atorvastatin (LIPITOR) 20 MG tablet Take 1 tablet (20 mg total) by mouth at bedtime. 30 tablet 2  . b complex vitamins tablet Take 1 tablet by mouth daily.    . Carboxymethylcell-Hypromellose (GENTEAL OP) Apply 1 drop to eye. Each eye as needed    . Cholecalciferol (VITAMIN D3) 50000 units CAPS Take 5,000 Int'l Units/day by mouth once a week. Reported on 01/25/2016 1 capsule   . ciprofloxacin (CIPRO) 500 MG tablet Take 1 tablet (500 mg total) by mouth every 12 (twelve) hours. 14 tablet 0  . Cyanocobalamin (VITAMIN B-12) 1000 MCG SUBL     . diazepam (VALIUM) 5 MG tablet Take one tablet 30 minutes prior to MRI 1 tablet 0  . dicyclomine (BENTYL) 10 MG capsule Take by mouth.    . erythromycin ophthalmic ointment     . estradiol (ESTRACE VAGINAL) 0.1 MG/GM vaginal cream Place 1 Applicatorful vaginally at bedtime.    . ferrous sulfate 325 (65 FE) MG tablet Take 1 tablet (325 mg total) by mouth daily with breakfast. 90 tablet 1  . fluticasone (FLONASE) 50 MCG/ACT nasal spray SPRAY 2 SPRAYS EACH INTO BOTH NOSTRILS ONCE DAILY EACH NIGHT.  5  . hydrocortisone (PROCTO-PAK) 1 % CREA Apply to rectum twice a day 28.35 g 0  . levofloxacin  (LEVAQUIN) 250 MG tablet Take 1 tablet (250 mg total) by mouth daily. 7 tablet 1  . loperamide (IMODIUM A-D) 2 MG tablet Take 2 mg by mouth 4 (four) times daily as needed for diarrhea or loose stools.    . meclizine (ANTIVERT) 25 MG tablet Take 1 tablet (25 mg total) by mouth every 8 (eight) hours as needed for dizziness. 30 tablet 2  . memantine (NAMENDA)  10 MG tablet     . metFORMIN (GLUCOPHAGE) 500 MG tablet Take 1 tablet (500 mg total) by mouth 2 (two) times daily with a meal. (Patient taking differently: Take 500 mg by mouth daily. ) 180 tablet 1  . mirabegron ER (MYRBETRIQ) 25 MG TB24 tablet Take 25 mg by mouth daily.    . Misc. Devices (HUGO ROLLING WALKER ELITE) MISC Rolling walker; knee sprain, arthritis; LON 99 months 1 each 0  . neomycin-polymyxin-hydrocortisone (CORTISPORIN) 3.5-10000-1 otic suspension Reported on 01/05/2016    . nystatin cream (MYCOSTATIN) Apply 1 application topically 2 (two) times daily. 30 g 0  . omeprazole (PRILOSEC) 40 MG capsule Take 40 mg by mouth daily.     . ondansetron (ZOFRAN) 4 MG tablet Take 4 mg by mouth every 8 (eight) hours as needed for nausea or vomiting. Reported on 12/22/2015    . oxycodone-acetaminophen (PERCOCET) 2.5-325 MG tablet Take 1 tablet by mouth every 4 (four) hours as needed for pain. 10 tablet 0  . polyethylene glycol powder (GLYCOLAX/MIRALAX) powder Take by mouth.    . Probiotic Product (PROBIOTIC COLON SUPPORT) CAPS Take 1 capsule by mouth daily.     . sertraline (ZOLOFT) 50 MG tablet Take 1 tablet (50 mg total) by mouth daily. 90 tablet 3  . Simethicone (GAS-X EXTRA STRENGTH) 125 MG CAPS Take 1 capsule by mouth as needed. Reported on 11/24/2015    . traMADol (ULTRAM) 50 MG tablet Take 1 tablet (50 mg total) by mouth every 6 (six) hours as needed. 20 tablet 0  . trimethoprim (TRIMPEX) 100 MG tablet Take 1 tablet (100 mg total) by mouth daily. 30 tablet 3  . trimethoprim (TRIMPEX) 100 MG tablet Take 1 tablet (100 mg total) by mouth daily.  90 tablet 0   No current facility-administered medications for this visit.     Review of Systems Review of Systems  Constitutional: Negative.   Respiratory: Negative.   Cardiovascular: Negative.     Blood pressure 122/74, pulse 70, resp. rate 12, height _0  (1.575 m), weight 153 lb (69.4 kg).  Physical Exam Physical Exam not performed today.   Data Reviewed Ct scan received and reviewed   Assessment    Rectal pain of unknown etiology. No abnormal findings on CT scan or previous physical exam. No evidence of anal fissure or perianal abscess.     Plan    Return as needed. Recommended that she should consult with pain management as needed and to follow up with her PCP. Patient verbalizes understanding.      This information has been scribed by Gaspar Cola CMA.    Luma Clopper G 09/26/2016, 3:57 PM

## 2016-09-26 NOTE — Patient Instructions (Addendum)
Return as needed. Recommended that she should consult with pain management as needed and to follow up with her PCP. Patient verbalizes understanding.

## 2016-09-28 ENCOUNTER — Encounter: Payer: Self-pay | Admitting: *Deleted

## 2016-09-28 ENCOUNTER — Ambulatory Visit
Admission: EM | Admit: 2016-09-28 | Discharge: 2016-09-28 | Disposition: A | Discharge status: Home / Self Care | Payer: Medicare HMO | Attending: Emergency Medicine | Admitting: Emergency Medicine

## 2016-09-28 DIAGNOSIS — G8929 Other chronic pain: Secondary | ICD-10-CM | POA: Diagnosis not present

## 2016-09-28 DIAGNOSIS — M533 Sacrococcygeal disorders, not elsewhere classified: Secondary | ICD-10-CM

## 2016-09-28 DIAGNOSIS — K6289 Other specified diseases of anus and rectum: Secondary | ICD-10-CM | POA: Diagnosis not present

## 2016-09-28 NOTE — ED Triage Notes (Signed)
Patient has been having symptoms of sacral and pelvic region pain for 2 months.  Patient has had recent scans and test to determine cause, but is still having pain.

## 2016-09-28 NOTE — Discharge Instructions (Signed)
Use a coccyx pillow or doughnut to help relieve pain. Take ibuprofen 200 mg with Tylenol 500 mg 2-3 times daily for pain. Follow-up with Dr. Sanda Klein on 10/04/2016.

## 2016-09-28 NOTE — ED Provider Notes (Signed)
CSN: [id number]     Arrival date & time 09/28/16  1526 History   First MD Initiated Contact with Patient 09/28/16 1715     Chief Complaint  Patient presents with  . Back Pain   (Consider location/radiation/quality/duration/timing/severity/associated sxs/prior Treatment) HPI  This 83 year old female who presents with symptoms of sacral and pelvic pain she has had for 2 months. She states that Friday before Christmas she underwent a pelvic ultrasound including transvaginal ultrasound and states that since that time she has had pain mostly in her rectal area. View of her extensive records she has had workup by general surgeon urologist and her primary care physician. She had a CT scan which did not explain her pelvic and rectal pain. She has known urethral diverticular disease. She states that the pain is constant as a stabbing pain right around the rectum on the outer portion.        Past Medical History:  Diagnosis Date  . Anxiety and depression   . Breast cancer ([place]) 2015   LT LUMPECTOMY  . Carpal tunnel syndrome   . Chest pain    SECONDARY TO GERD  . Chronic interstitial cystitis   . Chronic right hip pain   . Depression   . Diabetes mellitus without complication ([place])   . Diverticulosis   . Dyslipidemia   . Essential hypertension, benign   . Fibromyalgia   . Frequent falls   . GERD (gastroesophageal reflux disease)   . Hearing loss of left ear   . History of fractured rib   . History of TIA (transient ischemic attack)   . Irritable bowel   . MS (multiple sclerosis) ([place])   . Osteoarthritis   . Paroxysmal supraventricular tachycardia ([place])   . Peripheral neuropathy (HCC)    MILD  . Personal history of radiation therapy 2015   BREAST CA  . Protein malnutrition ([place])   . Pure hypercholesterolemia   . Recurrent UTI   . Skin cancer   . SOB (shortness of breath)    SECONDARY TO REFLUX  . Solitary pulmonary nodule on lung CT 12/04/2015   3 mm LLL lung nodule June 2016,  March 2017  . Tachycardia, paroxysmal (HCC)    Reportedly Paroxysmal Supraventricular Tachycardia, but unable to find documentation confirming this  . Thyroid nodule   . Vertigo   . Vitamin B 12 deficiency    Past Surgical History:  Procedure Laterality Date  . APPENDECTOMY    . AUGMENTATION MAMMAPLASTY Right 1975   BREAST BREAST ONLY  . BLADDER TACKING     X 3  . BREAST LUMPECTOMY    . BREAST SURGERY    . CARDIAC CATHETERIZATION  10/2001   Normal coronary arteries with the exception of 20% proximal D1  . CHOLECYSTECTOMY    . COLONOSCOPY WITH PROPOFOL N/A 01/21/2015   Procedure: COLONOSCOPY WITH PROPOFOL;  Surgeon: Josefine Class, MD;  Location: [facility] ENDOSCOPY;  Service: Endoscopy;  Laterality: N/A;  . ESOPHAGOGASTRODUODENOSCOPY N/A 01/21/2015   Procedure: ESOPHAGOGASTRODUODENOSCOPY (EGD);  Surgeon: Josefine Class, MD;  Location: [facility] ENDOSCOPY;  Service: Endoscopy;  Laterality: N/A;  . EYE SURGERIES     WITH BUCKLE DETACHMENT OF THE RETINA  . heart monitor     Not being used... Patient can not have a MRI  . HEMORRHOID SURGERY     WITH RECONSTRUCTION  . MASTECTOMY Right 1970   SUBCUTANEOUS - NO BREAST CANCER  . TONSILLECTOMY    . TOTAL ABDOMINAL HYSTERECTOMY W/ BILATERAL SALPINGOOPHORECTOMY    .  TYMPANOPLASTY     Family History  Problem Relation Age of Onset  . ALS Mother   . Kidney disease Sister   . Arthritis Sister   . Aneurysm Brother   . Heart disease Daughter   . Colon cancer    . Breast cancer Neg Hx   . Bladder Cancer Neg Hx   . Prostate cancer Neg Hx    Social History  Substance Use Topics  . Smoking status: Never Smoker  . Smokeless tobacco: Never Used  . Alcohol use No   OB History    No data available     Review of Systems  Constitutional: Positive for activity change. Negative for chills, fatigue and fever.  Gastrointestinal: Positive for rectal pain. Negative for anal bleeding.  Musculoskeletal: Positive for back pain.    Allergies   Aricept [donepezil hcl]; Biaxin [clarithromycin]; Copaxone [glatiramer acetate]; Decadron [dexamethasone]; Dexamethasone sodium phosphate; Diltiazem hcl; Diltiazem hcl; Flagyl [metronidazole]; Gabapentin; Iohexol; Ivp dye [iodinated diagnostic agents]; Levothyroxine; Macrobid [nitrofurantoin macrocrystal]; Sulfa antibiotics; Sulfonamide derivatives; Synthroid [levothyroxine sodium]; Copaxone  [glatiramer]; and Interferon beta-1a  Home Medications   Prior to Admission medications   Medication Sig Start Date End Date Taking? Authorizing Provider  acetaminophen (TYLENOL) 500 MG tablet Take by mouth.   Yes Historical Provider, MD  aspirin EC 81 MG tablet Take 1 tablet (81 mg total) by mouth daily. 11/11/15  Yes Arnetha Courser, MD  atorvastatin (LIPITOR) 20 MG tablet Take 1 tablet (20 mg total) by mouth at bedtime. 08/02/16  Yes Arnetha Courser, MD  b complex vitamins tablet Take 1 tablet by mouth daily.   Yes Historical Provider, MD  Carboxymethylcell-Hypromellose (GENTEAL OP) Apply 1 drop to eye. Each eye as needed   Yes Historical Provider, MD  Cholecalciferol (VITAMIN D3) 50000 units CAPS Take 5,000 Int'l Units/day by mouth once a week. Reported on 01/25/2016 04/04/16  Yes Arnetha Courser, MD  Cyanocobalamin (VITAMIN B-12) 1000 MCG SUBL  09/16/15  Yes Historical Provider, MD  dicyclomine (BENTYL) 10 MG capsule Take by mouth. 01/21/15  Yes Historical Provider, MD  erythromycin ophthalmic ointment  12/31/14  Yes Historical Provider, MD  ferrous sulfate 325 (65 FE) MG tablet Take 1 tablet (325 mg total) by mouth daily with breakfast. 09/14/15  Yes Bobetta Lime, MD  fluticasone (FLONASE) 50 MCG/ACT nasal spray SPRAY 2 SPRAYS EACH INTO BOTH NOSTRILS ONCE DAILY EACH NIGHT. 10/19/15  Yes Historical Provider, MD  loperamide (IMODIUM A-D) 2 MG tablet Take 2 mg by mouth 4 (four) times daily as needed for diarrhea or loose stools.   Yes Historical Provider, MD  meclizine (ANTIVERT) 25 MG tablet Take 1 tablet (25 mg  total) by mouth every 8 (eight) hours as needed for dizziness. 07/31/16  Yes Arnetha Courser, MD  metFORMIN (GLUCOPHAGE) 500 MG tablet Take 1 tablet (500 mg total) by mouth 2 (two) times daily with a meal. Patient taking differently: Take 500 mg by mouth daily.  12/29/15  Yes Arnetha Courser, MD  Misc. Devices (HUGO ROLLING WALKER ELITE) MISC Rolling walker; knee sprain, arthritis; LON 99 months 02/13/16  Yes Arnetha Courser, MD  omeprazole (PRILOSEC) 40 MG capsule Take 40 mg by mouth daily.  10/22/15  Yes Historical Provider, MD  polyethylene glycol powder (GLYCOLAX/MIRALAX) powder Take by mouth. 05/08/16  Yes Historical Provider, MD  Probiotic Product (PROBIOTIC COLON SUPPORT) CAPS Take 1 capsule by mouth daily.    Yes Historical Provider, MD  sertraline (ZOLOFT) 50 MG tablet Take 1 tablet (  50 mg total) by mouth daily. 02/13/16  Yes Arnetha Courser, MD  Simethicone (GAS-X EXTRA STRENGTH) 125 MG CAPS Take 1 capsule by mouth as needed. Reported on 11/24/2015   Yes Historical Provider, MD  trimethoprim (TRIMPEX) 100 MG tablet Take 1 tablet (100 mg total) by mouth daily. 09/12/16  Yes Shannon A McGowan, PA-C  ciprofloxacin (CIPRO) 500 MG tablet Take 1 tablet (500 mg total) by mouth every 12 (twelve) hours. 08/31/16   Nori Riis, PA-C  diazepam (VALIUM) 5 MG tablet Take one tablet 30 minutes prior to MRI 07/12/16   Larene Beach A McGowan, PA-C  estradiol (ESTRACE VAGINAL) 0.1 MG/GM vaginal cream Place 1 Applicatorful vaginally at bedtime.    Historical Provider, MD  hydrocortisone (PROCTO-PAK) 1 % CREA Apply to rectum twice a day 09/03/16   Arnetha Courser, MD  levofloxacin (LEVAQUIN) 250 MG tablet Take 1 tablet (250 mg total) by mouth daily. 07/12/16   Nori Riis, PA-C  memantine (NAMENDA) 10 MG tablet  08/11/16   Historical Provider, MD  mirabegron ER (MYRBETRIQ) 25 MG TB24 tablet Take 25 mg by mouth daily.    Historical Provider, MD  neomycin-polymyxin-hydrocortisone (CORTISPORIN) 3.5-10000-1 otic suspension  Reported on 01/05/2016 12/17/15   Historical Provider, MD  nystatin cream (MYCOSTATIN) Apply 1 application topically 2 (two) times daily. 01/25/16   Larene Beach A McGowan, PA-C  ondansetron (ZOFRAN) 4 MG tablet Take 4 mg by mouth every 8 (eight) hours as needed for nausea or vomiting. Reported on 12/22/2015    Historical Provider, MD  oxycodone-acetaminophen (PERCOCET) 2.5-325 MG tablet Take 1 tablet by mouth every 4 (four) hours as needed for pain. 08/09/16   Nori Riis, PA-C  traMADol (ULTRAM) 50 MG tablet Take 1 tablet (50 mg total) by mouth every 6 (six) hours as needed. 08/28/16   Daymon Larsen, MD  trimethoprim (TRIMPEX) 100 MG tablet Take 1 tablet (100 mg total) by mouth daily. 08/30/16   Nori Riis, PA-C   Meds Ordered and Administered this Visit  Medications - No data to display  BP (!) 144/83 (BP Location: Right Arm)   Pulse 81   Temp 98.4 F (36.9 C) (Oral)   Resp 16   SpO2 98%  No data found.   Physical Exam  Constitutional: She is oriented to person, place, and time. She appears well-developed and well-nourished. No distress.  HENT:  Head: Normocephalic and atraumatic.  Eyes: EOM are normal. Pupils are equal, round, and reactive to light. Right eye exhibits no discharge. Left eye exhibits no discharge.  Neck: Normal range of motion. Neck supple.  Abdominal: Soft. Bowel sounds are normal. She exhibits no distension. There is no tenderness. There is no rebound and no guarding.  Genitourinary:  Genitourinary Comments: Rectal examination was performed with Camile CMA as chaperone and assistant. Patient does have some tenderness over the inferior sacrum and into the coccyx. Most tenderness is just inside the rectal vestibule which reproduces her symptoms. No bleeding no discharge present. She has no fissures present.  Musculoskeletal: Normal range of motion.  Neurological: She is alert and oriented to person, place, and time.  Skin: Skin is warm and dry. She is not  diaphoretic.  Psychiatric: She has a normal mood and affect. Her behavior is normal. Judgment and thought content normal.  Nursing note and vitals reviewed.   Urgent Care Course     Procedures (including critical care time)  Labs Review Labs Reviewed - No data to display  Imaging Review No  results found.   Visual Acuity Review  Right Eye Distance:   Left Eye Distance:   Bilateral Distance:    Right Eye Near:   Left Eye Near:    Bilateral Near:         MDM   1. Rectal pain, chronic   2. [place]    Discharge Medication List as of 09/28/2016  5:53 PM    .Plan: 1. Test/x-ray results and diagnosis reviewed with patient 2. rx as per orders; risks, benefits, potential side effects reviewed with patient 3. Recommend supportive treatment with  4. F/u prn if symptoms worsen or don't improve     Lorin Picket, PA-C 09/28/16 1909

## 2016-10-02 ENCOUNTER — Telehealth: Payer: Self-pay | Admitting: Family Medicine

## 2016-10-02 ENCOUNTER — Encounter: Payer: Self-pay | Admitting: Urology

## 2016-10-02 ENCOUNTER — Ambulatory Visit: Payer: Medicare HMO | Admitting: Urology

## 2016-10-02 VITALS — BP 151/116 | HR 82 | Ht 60.0 in | Wt 152.7 lb

## 2016-10-02 DIAGNOSIS — R3129 Other microscopic hematuria: Secondary | ICD-10-CM | POA: Diagnosis not present

## 2016-10-02 DIAGNOSIS — N390 Urinary tract infection, site not specified: Secondary | ICD-10-CM | POA: Diagnosis not present

## 2016-10-02 DIAGNOSIS — N3010 Interstitial cystitis (chronic) without hematuria: Secondary | ICD-10-CM | POA: Diagnosis not present

## 2016-10-02 DIAGNOSIS — N361 Urethral diverticulum: Secondary | ICD-10-CM | POA: Diagnosis not present

## 2016-10-02 DIAGNOSIS — R32 Unspecified urinary incontinence: Secondary | ICD-10-CM | POA: Diagnosis not present

## 2016-10-02 DIAGNOSIS — N952 Postmenopausal atrophic vaginitis: Secondary | ICD-10-CM | POA: Diagnosis not present

## 2016-10-02 DIAGNOSIS — R296 Repeated falls: Secondary | ICD-10-CM

## 2016-10-02 LAB — URINALYSIS, COMPLETE
Bilirubin, UA: NEGATIVE
GLUCOSE, UA: NEGATIVE
Nitrite, UA: NEGATIVE
RBC UA: NEGATIVE
UUROB: 0.2 mg/dL (ref 0.2–1.0)
pH, UA: 5 (ref 5.0–7.5)

## 2016-10-02 LAB — MICROSCOPIC EXAMINATION: Epithelial Cells (non renal): >10 /hpf — AB (ref 0–10)

## 2016-10-02 MED ORDER — LIDOCAINE HCL 2 % IJ SOLN
11.0000 mL | Freq: Once | INTRAMUSCULAR | Status: AC
  Administered 2016-10-02: 11 mL

## 2016-10-02 MED ORDER — AMLODIPINE BESYLATE 2.5 MG PO TABS: 2.5000 mg | ORAL_TABLET | Freq: Every day | ORAL | 0 refills | Status: DC

## 2016-10-02 MED ORDER — LIDOCAINE HCL 2 % EX GEL
1.0000 "application " | Freq: Once | CUTANEOUS | Status: AC
  Administered 2016-10-02: 1 via URETHRAL

## 2016-10-02 NOTE — Telephone Encounter (Signed)
Patient's BP is too high; reviewed urology note today Please call patient; ask if she has ever been on amlodipine; I'd suggest that we try that to help bring her pressure down a bit; if any problems, let me know Thank you, Rx sent

## 2016-10-02 NOTE — Telephone Encounter (Signed)
Called pt and informed pt that Dr.lada saw that her BP was to high. I mention to pt that Dr. lada called her in amlodipine 2.5mg  to take. Also mention to pt once she start taking the medication to check her BP and write down the readings so we can get an idea how the medication is doing. Pt understood.

## 2016-10-02 NOTE — Addendum Note (Signed)
Addended by: Orlene Erm on: 10/02/2016 01:03 PM   Modules accepted: Orders

## 2016-10-02 NOTE — Progress Notes (Addendum)
12:08 PM   IKIA CINCOTTA 09-24-1929 [id number]  Referring provider: Arnetha Courser, MD [address] [place], [place] [address]  Chief Complaint  Patient presents with  . Cystitis    Rescue Solution    HPI: Patient is an 83 year old Caucasian female with a history of IC, fall risk, a history of hematuria, incontinence, urethral diverticulum and a history of recurrent UTI's who presents today for a rescue installation.     Fall risk Patient has had numerous falls.  She has not reported one recently.    History of hematuria Patient had a CT abdomen and pelvis wo contrast on 08/03/2015 which noted a stable hyperdense cyst in the left kidney.  A cystoscopy on 01/04/2016 which was negative.   She denies any gross hematuria.  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  She could not undergo a MRI of this area due to having a non-functioning pacemaker in place.  She underwent a pelvic ultrasound demonstrated a soft tissue thickening of the proximal urethra measuring up to 2 cm.  This may represent urethral diverticulum with inspissated debris versus true soft tissue mass.  This may be the cause of her hematuria.    History of recurrent UTI's Patient has had greater than > 4 documented UTI's over the last year.  Risk factors for UTI's are urethral diverticulum, advanced age, weekly instrumentation of bladder for rescue solutions, vaginal atrophy and incontinence.  She has just completed a course of Cipro for an E. Coli UTI.  She will now be placed on suppressive antibiotic therapy with trimethoprim daily.  Patient had contacted the office regarding her antibiotics and became confused regarding the information given to her.   I have called her pharmacy and she has filled the trimethoprim and will be getting a refill today.  Her UA today was unremarkable.    Interstitial cystitis Patient presents today for a rescue  solution.  She feels she is having improvement with the instillations.  She is complaining of frequency, urgency, dysuria, nocturia and incontinence.  She gets relief of her bladder pain for a few days after the solution is given.  She was recently seen by Dr. Jamal Collin and the ED for her rectal pain.  No etiology was found for the pain.  Today, she is having nocturia and incontinence.  We discussed this issue further and have decided to hold on the rescue solutions, so she can use her co-pays towards the PT.    Incontinence Patient no longer taking the Myrbetriq 25 mg daily.  PVR is 100 mL at today's exam.  I have contacted her pharmacy and she has not taken the Myrbetriq for the last 6 months.    Urethral diverticulum Pelvic CT performed in 06/2016 and there were findings suspicious for an urethral diverticulum.   Unchanged focal density at the level of the proximal urethra favoring calcium/urolithiasis within diverticulum, less likely colovesicular fistula given stability.  Diverticulosis without acute diverticulitis.  She could not have a MRI due to an implanted non functional pacemaker.  She underwent a pelvic ultrasound demonstrated a soft tissue thickening of the proximal urethra measuring up to 2 cm.  This may represent urethral diverticulum with inspissated debris versus true soft tissue mass.  I have discussed her findings with Dr. Pilar Jarvis and Dr. Matilde Sprang.  Both have recommended suppressive antibiotics but do not recommend surgical intervention at this time due to  her age and co-morbidities.    PMH: Past Medical History:  Diagnosis Date  . Anxiety and depression   . Breast cancer ([facility]) 2015   LT LUMPECTOMY  . Carpal tunnel syndrome   . Chest pain    SECONDARY TO GERD  . Chronic interstitial cystitis   . Chronic right hip pain   . Depression   . Diabetes mellitus without complication ([facility])   . Diverticulosis   . Dyslipidemia   . Essential hypertension, benign   . Fibromyalgia   .  Frequent falls   . GERD (gastroesophageal reflux disease)   . Hearing loss of left ear   . History of fractured rib   . History of TIA (transient ischemic attack)   . Irritable bowel   . MS (multiple sclerosis) ([facility])   . Osteoarthritis   . Paroxysmal supraventricular tachycardia ([facility])   . Peripheral neuropathy ([facility])    MILD  . Personal history of radiation therapy 2015   BREAST CA  . Protein malnutrition ([facility])   . Pure hypercholesterolemia   . Recurrent UTI   . Skin cancer   . SOB (shortness of breath)    SECONDARY TO REFLUX  . Solitary pulmonary nodule on lung CT 12/04/2015   3 mm LLL lung nodule June 2016, March 2017  . Tachycardia, paroxysmal ([facility])    Reportedly Paroxysmal Supraventricular Tachycardia, but unable to find documentation confirming this  . Thyroid nodule   . Vertigo   . Vitamin B 12 deficiency     Surgical History: Past Surgical History:  Procedure Laterality Date  . APPENDECTOMY    . AUGMENTATION MAMMAPLASTY Right 1975   BREAST BREAST ONLY  . BLADDER TACKING     X 3  . BREAST LUMPECTOMY    . BREAST SURGERY    . CARDIAC CATHETERIZATION  10/2001   Normal coronary arteries with the exception of 20% proximal D1  . CHOLECYSTECTOMY    . COLONOSCOPY WITH PROPOFOL N/A 01/21/2015   Procedure: COLONOSCOPY WITH PROPOFOL;  Surgeon: Josefine Class, MD;  Location: [facility] ENDOSCOPY;  Service: Endoscopy;  Laterality: N/A;  . ESOPHAGOGASTRODUODENOSCOPY N/A 01/21/2015   Procedure: ESOPHAGOGASTRODUODENOSCOPY (EGD);  Surgeon: Josefine Class, MD;  Location: [facility] ENDOSCOPY;  Service: Endoscopy;  Laterality: N/A;  . EYE SURGERIES     WITH BUCKLE DETACHMENT OF THE RETINA  . heart monitor     Not being used... Patient can not have a MRI  . HEMORRHOID SURGERY     WITH RECONSTRUCTION  . MASTECTOMY Right 1970   SUBCUTANEOUS - NO BREAST CANCER  . TONSILLECTOMY    . TOTAL ABDOMINAL HYSTERECTOMY W/ BILATERAL SALPINGOOPHORECTOMY    . TYMPANOPLASTY      Home  Medications:  Allergies as of 10/02/2016      Reactions   Aricept [donepezil Hcl] Other (See Comments)   hallucinations   Biaxin [clarithromycin] Other (See Comments), Diarrhea   Copaxone [glatiramer Acetate]    Decadron [dexamethasone] Nausea And Vomiting, Other (See Comments)   Other reaction(s): Muscle Pain Reaction:  Abdominal pain   Dexamethasone Sodium Phosphate Other (See Comments)   Diltiazem Hcl Other (See Comments)   Reaction:  Unknown    Diltiazem Hcl Other (See Comments)   Flagyl [metronidazole] Nausea Only, Diarrhea   Other reaction(s): Vomiting   Gabapentin Other (See Comments)   "burning all over" Reaction:  Unknown    Iohexol Swelling, Other (See Comments)    Desc: tongue swelling with "IVP dye" sccording to nurses notes with a lumbar myelo-12/09-  asm, Onset Date: [id number]  Pts tongue swells.   Ivp Dye [iodinated Diagnostic Agents] Swelling, Other (See Comments)   Passed out  Pts tongue swells.    Levothyroxine Nausea Only   Macrobid [nitrofurantoin Macrocrystal] Nausea And Vomiting   Sulfa Antibiotics Other (See Comments)   Reaction:  Unknown    Sulfonamide Derivatives Other (See Comments)   Reaction:  Unknown    Synthroid [levothyroxine Sodium]    Copaxone  [glatiramer] Rash   Interferon Beta-1a Other (See Comments), Rash   Reaction:  Unknown       Medication List       Accurate as of 10/02/16 12:08 PM. Always use your most recent med list.          acetaminophen 500 MG tablet Commonly known as:  TYLENOL Take by mouth.   aspirin EC 81 MG tablet Take 1 tablet (81 mg total) by mouth daily.   atorvastatin 20 MG tablet Commonly known as:  LIPITOR Take 1 tablet (20 mg total) by mouth at bedtime.   b complex vitamins tablet Take 1 tablet by mouth daily.   ciprofloxacin 500 MG tablet Commonly known as:  CIPRO Take 1 tablet (500 mg total) by mouth every 12 (twelve) hours.   diazepam 5 MG tablet Commonly known as:  VALIUM Take one tablet 30  minutes prior to MRI   dicyclomine 10 MG capsule Commonly known as:  BENTYL Take by mouth.   erythromycin ophthalmic ointment   ESTRACE VAGINAL 0.1 MG/GM vaginal cream Generic drug:  estradiol Place 1 Applicatorful vaginally at bedtime.   ferrous sulfate 325 (65 FE) MG tablet Take 1 tablet (325 mg total) by mouth daily with breakfast.   fluticasone 50 MCG/ACT nasal spray Commonly known as:  FLONASE SPRAY 2 SPRAYS EACH INTO BOTH NOSTRILS ONCE DAILY EACH NIGHT.   GAS-X EXTRA STRENGTH 125 MG Caps Generic drug:  Simethicone Take 1 capsule by mouth as needed. Reported on 11/24/2015   GENTEAL OP Apply 1 drop to eye. Each eye as needed   [place] Rolling walker; knee sprain, arthritis; LON 99 months   hydrocortisone 1 % Crea Commonly known as:  PROCTO-PAK Apply to rectum twice a day   levofloxacin 250 MG tablet Commonly known as:  LEVAQUIN Take 1 tablet (250 mg total) by mouth daily.   loperamide 2 MG tablet Commonly known as:  IMODIUM A-D Take 2 mg by mouth 4 (four) times daily as needed for diarrhea or loose stools.   meclizine 25 MG tablet Commonly known as:  ANTIVERT Take 1 tablet (25 mg total) by mouth every 8 (eight) hours as needed for dizziness.   memantine 10 MG tablet Commonly known as:  NAMENDA   metFORMIN 500 MG tablet Commonly known as:  GLUCOPHAGE Take 1 tablet (500 mg total) by mouth 2 (two) times daily with a meal.   MYRBETRIQ 25 MG Tb24 tablet Generic drug:  mirabegron ER Take 25 mg by mouth daily.   neomycin-polymyxin-hydrocortisone 3.5-10000-1 otic suspension Commonly known as:  CORTISPORIN Reported on 01/05/2016   nystatin cream Commonly known as:  MYCOSTATIN Apply 1 application topically 2 (two) times daily.   omeprazole 40 MG capsule Commonly known as:  PRILOSEC Take 40 mg by mouth daily.   ondansetron 4 MG tablet Commonly known as:  ZOFRAN Take 4 mg by mouth every 8 (eight) hours as needed for nausea or vomiting.  Reported on 12/22/2015   oxycodone-acetaminophen 2.5-325 MG tablet Commonly known as:  PERCOCET Take 1  tablet by mouth every 4 (four) hours as needed for pain.   polyethylene glycol powder powder Commonly known as:  GLYCOLAX/MIRALAX Take by mouth.   PROBIOTIC COLON SUPPORT Caps Take 1 capsule by mouth daily.   sertraline 50 MG tablet Commonly known as:  ZOLOFT Take 1 tablet (50 mg total) by mouth daily.   traMADol 50 MG tablet Commonly known as:  ULTRAM Take 1 tablet (50 mg total) by mouth every 6 (six) hours as needed.   trimethoprim 100 MG tablet Commonly known as:  TRIMPEX Take 1 tablet (100 mg total) by mouth daily.   trimethoprim 100 MG tablet Commonly known as:  TRIMPEX Take 1 tablet (100 mg total) by mouth daily.   Vitamin B-12 1000 MCG Subl   Vitamin D3 50000 units Caps Take 5,000 Int'l Units/day by mouth once a week. Reported on 01/25/2016       Allergies:  Allergies  Allergen Reactions  . Aricept [Donepezil Hcl] Other (See Comments)    hallucinations  . Biaxin [Clarithromycin] Other (See Comments) and Diarrhea  . Copaxone [Glatiramer Acetate]   . Decadron [Dexamethasone] Nausea And Vomiting and Other (See Comments)    Other reaction(s): Muscle Pain Reaction:  Abdominal pain  . Dexamethasone Sodium Phosphate Other (See Comments)  . Diltiazem Hcl Other (See Comments)    Reaction:  Unknown   . Diltiazem Hcl Other (See Comments)  . Flagyl [Metronidazole] Nausea Only and Diarrhea    Other reaction(s): Vomiting  . Gabapentin Other (See Comments)    "burning all over" Reaction:  Unknown   . Iohexol Swelling and Other (See Comments)     Desc: tongue swelling with "IVP dye" sccording to nurses notes with a lumbar myelo-12/09- asm, Onset Date: [id number]  Pts tongue swells.  Clementeen Hoof [Iodinated Diagnostic Agents] Swelling and Other (See Comments)    Passed out  Pts tongue swells.   . Levothyroxine Nausea Only  . Macrobid [Nitrofurantoin Macrocrystal] Nausea  And Vomiting  . Sulfa Antibiotics Other (See Comments)    Reaction:  Unknown   . Sulfonamide Derivatives Other (See Comments)    Reaction:  Unknown   . Synthroid [Levothyroxine Sodium]   . Copaxone  [Glatiramer] Rash  . Interferon Beta-1a Other (See Comments) and Rash    Reaction:  Unknown     Family History: Family History  Problem Relation Age of Onset  . ALS Mother   . Kidney disease Sister   . Arthritis Sister   . Aneurysm Brother   . Heart disease Daughter   . Colon cancer    . Breast cancer Neg Hx   . Bladder Cancer Neg Hx   . Prostate cancer Neg Hx     Social History:  reports that she has never smoked. She has never used smokeless tobacco. She reports that she does not drink alcohol or use drugs.  ROS: UROLOGY Frequent Urination?: No Hard to postpone urination?: No Burning/pain with urination?: Yes Get up at night to urinate?: Yes Leakage of urine?: Yes Urine stream starts and stops?: No Trouble starting stream?: No Do you have to strain to urinate?: No Blood in urine?: No Urinary tract infection?: No Sexually transmitted disease?: No Injury to kidneys or bladder?: No Painful intercourse?: No Weak stream?: No Currently pregnant?: No Vaginal bleeding?: No Last menstrual period?: n  Gastrointestinal Nausea?: No Vomiting?: No Indigestion/heartburn?: No Diarrhea?: Yes Constipation?: Yes  Constitutional Fever: No Night sweats?: Yes Weight loss?: No Fatigue?: Yes  Skin Skin rash/lesions?: No Itching?: No  Eyes Blurred vision?: Yes Double vision?: No  Ears/Nose/Throat Sore throat?: No Sinus problems?: Yes  Hematologic/Lymphatic Swollen glands?: No Easy bruising?: No  Cardiovascular Leg swelling?: No Chest pain?: No  Respiratory Cough?: No Shortness of breath?: No  Endocrine Excessive thirst?: No  Musculoskeletal Back pain?: Yes Joint pain?: Yes  Neurological Headaches?: Yes Dizziness?: Yes  Psychologic Depression?:  Yes Anxiety?: Yes  Physical Exam: Blood pressure (!) 151/116, pulse 82, height 5' (1.524 m), weight 152 lb 11.2 oz (69.3 kg). Constitutional: Well nourished. Alert and oriented, No acute distress. HEENT: [place] AT, moist mucus membranes. Trachea midline, no masses. Cardiovascular: No clubbing, cyanosis, or edema. Respiratory: Normal respiratory effort, no increased work of breathing. Skin: No rashes, bruises or suspicious lesions. Lymph: No cervical or inguinal adenopathy. Neurologic: Grossly intact, no focal deficits, moving all 4 extremities. Psychiatric: Normal mood and affect.  Laboratory Data: Lab Results  Component Value Date   WBC 4.1 08/28/2016   HGB 13.9 08/28/2016   HCT 40.5 08/28/2016   MCV 91.4 08/28/2016   PLT 196 08/28/2016   Lab Results  Component Value Date   CREATININE 1.08 (H) 08/28/2016   Lab Results  Component Value Date   HGBA1C 6.5 (H) 04/03/2016   Urinalysis Unremarkable.  See Epic.     Assessment & Plan:    1. Interstitial cystitis  - Patient's rescue installation is performed today   - Unsure if her pelvic pain is due to IC or diverticulum- refer to PT for further evaluation - patient cannot afford the co-pays  - Will hold rescue solutions for now so patient can use her co-pays towards PT  2. Recurrent UTI's  - urine culture sent for culture  - on suppressive antibiotic therapy due to diverticulum- trimethoprim 100 mg daily    3. Microscopic hematuria  - Work up completed in 2017.  No malignancies discovered.    - UA checked yearly for hematuria  - No recent gross hematuria.   - Possible urethral diverticulum seen on CT performed on 07/02/2016 and pelvic US   4. Falling  - Continue to assist the patient with transfers to table   - Referral to PT - will hold rescue solutions  5. Vaginal atrophy  - she is not longer using the vaginal cream  - given sample today of Premarin cream  6. Incontinence  - patient is not currently taking  Myrbetriq 25 mg daily  - she does not consider incontinence a bother at this time  7. Urethral diverticulum  - patient cannot have MRI due to the fact she has a non functional pacemaker still implanted  - CT of pelvis and  transvaginal ultrasound is suspicious for an urethral diverticulum  - reviewed patient's case with Dr. Matilde Sprang and Dr. Pilar Jarvis, they do not recommend any surgical intervention at this time and to continue the suppressive antibiotic at this time  Return for refer to PT.  Zara Council, [facility] [address] [place], [place] [address] [phone]

## 2016-10-02 NOTE — Progress Notes (Signed)
Bladder Rescue Solution Instillation  Due to interstitial cystitis patient is present today for a Rescue Solution Treatment.  Patient was cleaned and prepped in a sterile fashion with betadine and lidocaine 2% jelly was instilled into the urethra.  A 14 FR catheter was inserted, urine return was noted 20ml, urine was yellow in color.  Instilled a solution consisting of 88ml of Sodium Bicarb, 2 ml Lidocaine and 1 ml of Heparin. The catheter was then removed. Patient tolerated well, no complications were noted.   Performed by: Lyndee Hensen CMA  Follow up/ Additional Notes: One week

## 2016-10-04 ENCOUNTER — Encounter: Payer: Self-pay | Admitting: Family Medicine

## 2016-10-04 ENCOUNTER — Ambulatory Visit (INDEPENDENT_AMBULATORY_CARE_PROVIDER_SITE_OTHER): Payer: Medicare HMO | Admitting: Family Medicine

## 2016-10-04 VITALS — BP 122/82 | HR 77 | Temp 98.3°F | Resp 14 | Wt 153.4 lb

## 2016-10-04 DIAGNOSIS — I10 Essential (primary) hypertension: Secondary | ICD-10-CM

## 2016-10-04 DIAGNOSIS — D0512 Intraductal carcinoma in situ of left breast: Secondary | ICD-10-CM | POA: Diagnosis not present

## 2016-10-04 DIAGNOSIS — M47816 Spondylosis without myelopathy or radiculopathy, lumbar region: Secondary | ICD-10-CM

## 2016-10-04 DIAGNOSIS — I2510 Atherosclerotic heart disease of native coronary artery without angina pectoris: Secondary | ICD-10-CM

## 2016-10-04 DIAGNOSIS — E7800 Pure hypercholesterolemia, unspecified: Secondary | ICD-10-CM | POA: Diagnosis not present

## 2016-10-04 DIAGNOSIS — Z5181 Encounter for therapeutic drug level monitoring: Secondary | ICD-10-CM | POA: Diagnosis not present

## 2016-10-04 DIAGNOSIS — E1140 Type 2 diabetes mellitus with diabetic neuropathy, unspecified: Secondary | ICD-10-CM | POA: Diagnosis not present

## 2016-10-04 DIAGNOSIS — M4696 Unspecified inflammatory spondylopathy, lumbar region: Secondary | ICD-10-CM

## 2016-10-04 DIAGNOSIS — F0390 Unspecified dementia without behavioral disturbance: Secondary | ICD-10-CM | POA: Diagnosis not present

## 2016-10-04 DIAGNOSIS — Z9011 Acquired absence of right breast and nipple: Secondary | ICD-10-CM

## 2016-10-04 DIAGNOSIS — K222 Esophageal obstruction: Secondary | ICD-10-CM

## 2016-10-04 DIAGNOSIS — L602 Onychogryphosis: Secondary | ICD-10-CM | POA: Diagnosis not present

## 2016-10-04 HISTORY — DX: Spondylosis without myelopathy or radiculopathy, lumbar region: M47.816

## 2016-10-04 LAB — COMPLETE METABOLIC PANEL WITH GFR
ALBUMIN: 4.1 g/dL (ref 3.6–5.1)
ALK PHOS: 38 U/L (ref 33–130)
ALT: 21 U/L (ref 6–29)
AST: 12 U/L (ref 10–35)
BILIRUBIN TOTAL: 0.4 mg/dL (ref 0.2–1.2)
BUN: 14 mg/dL (ref 7–25)
CALCIUM: 9.7 mg/dL (ref 8.6–10.4)
CO2: 24 mmol/L (ref 20–31)
Chloride: 108 mmol/L (ref 98–110)
Creat: 1.06 mg/dL — ABNORMAL HIGH (ref 0.60–0.88)
GFR, EST NON AFRICAN AMERICAN: 48 mL/min — AB (ref >=60)
GFR, Est African American: 56 mL/min — ABNORMAL LOW (ref >=60)
GLUCOSE: 96 mg/dL (ref 65–99)
Potassium: 4.5 mmol/L (ref 3.5–5.3)
SODIUM: 142 mmol/L (ref 135–146)
TOTAL PROTEIN: 6.6 g/dL (ref 6.1–8.1)

## 2016-10-04 LAB — LIPID PANEL
CHOLESTEROL: 218 mg/dL — AB (ref <200)
HDL: 76 mg/dL (ref >50)
LDL Cholesterol: 115 mg/dL — ABNORMAL HIGH (ref <100)
Total CHOL/HDL Ratio: 2.9 Ratio (ref <5.0)
Triglycerides: 137 mg/dL (ref <150)
VLDL: 27 mg/dL (ref <30)

## 2016-10-04 LAB — HEMOGLOBIN A1C
HEMOGLOBIN A1C: 6.6 % — AB (ref <5.7)
MEAN PLASMA GLUCOSE: 143 mg/dL

## 2016-10-04 NOTE — Patient Instructions (Addendum)
We'll get labs today We'll have you see someone about your back and pelvis We'll have you see the foot doctor We'll contact you about the results If you have not heard anything from my staff in a week about any orders/referrals/studies from today, please contact us here to follow-up [phone]

## 2016-10-04 NOTE — Assessment & Plan Note (Signed)
Excellent control today on the new med

## 2016-10-04 NOTE — Assessment & Plan Note (Signed)
Stable on Namenda

## 2016-10-04 NOTE — Assessment & Plan Note (Signed)
Foot exam by MD; check glucose and A1c

## 2016-10-04 NOTE — Assessment & Plan Note (Signed)
20 years ago due to cystitis

## 2016-10-04 NOTE — Assessment & Plan Note (Signed)
No chest pain, continue aspirin; control BP and sugar and lipids

## 2016-10-04 NOTE — Assessment & Plan Note (Signed)
Check lipids; statin; try to limit saturated fats

## 2016-10-04 NOTE — Progress Notes (Signed)
BP 122/82   Pulse 77   Temp 98.3 F (36.8 C) (Oral)   Resp 14   Wt 153 lb 6.4 oz (69.6 kg)   SpO2 97%   BMI 29.96 kg/m    Subjective:    Patient ID: [name], female    DOB: [birth year], 83 y.o.   MRN: [id number]  HPI: [name] is a 83 y.o. female  Chief Complaint  Patient presents with  . Follow-up   She is here for follow-up; high BP noted at urologist; started on CCB; BP much better today  Type 2 diabetes No problems with her feet On metformin Lab Results  Component Value Date   HGBA1C 6.6 (H) 10/04/2016   High cholesterol Lab Results  Component Value Date   CHOL 218 (H) 10/04/2016   CHOL 228 (H) 04/03/2016   CHOL 218 (H) 12/28/2015   Lab Results  Component Value Date   HDL 76 10/04/2016   HDL 71 04/03/2016   HDL 80 12/28/2015   Lab Results  Component Value Date   LDLCALC 115 (H) 10/04/2016   LDLCALC 124 04/03/2016   LDLCALC 120 (H) 12/28/2015   Lab Results  Component Value Date   TRIG 137 10/04/2016   TRIG 167 (H) 04/03/2016   TRIG 91 12/28/2015   Lab Results  Component Value Date   CHOLHDL 2.9 10/04/2016   CHOLHDL 3.2 04/03/2016   CHOLHDL 2.6 04/18/2015   No results found for: LDLDIRECT  Coronary artery disease; taking 81 mg aspirin; no chest pain  When eating feeling like food is getting stuck; heartburn a lot, reflux of food if bending over  She still has pain in her rectum; has seen surgeon, had scanning, barium enema, and then saw urgent care Can't walk without pain in lower back and hips; pain is burning feeling; urgent care thought maybe nerve is causing the pain Trying ibuprofen and tylenol together and that helps pain She gets sweaty at night  Memory 6CIT Screen 10/04/2016  What Year? 0 points  What month? 0 points  What time? 0 points  Count back from 20 0 points  Months in reverse 0 points  Repeat phrase 2 points  Total Score 2    Depression screen [facility] 2/9 10/10/2016 10/04/2016 09/11/2016 09/03/2016 07/31/2016    Decreased Interest 0 0 0 0 0  Down, Depressed, Hopeless 0 0 0 0 0  PHQ - 2 Score 0 0 0 0 0  Altered sleeping - - - - -  Tired, decreased energy - - - - -  Change in appetite - - - - -  Feeling bad or failure about yourself  - - - - -  Trouble concentrating - - - - -  Moving slowly or fidgety/restless - - - - -  Suicidal thoughts - - - - -  PHQ-9 Score - - - - -  Difficult doing work/chores - - - - -  Some recent data might be hidden   Relevant past medical, surgical, family and social history reviewed Past Medical History:  Diagnosis Date  . Anxiety and depression   . Breast cancer ([place]) 2015   LT LUMPECTOMY  . Carpal tunnel syndrome   . Chest pain    SECONDARY TO GERD  . Chronic interstitial cystitis   . Chronic right hip pain   . Depression   . Diabetes mellitus without complication ([place])   . Diverticulosis   . Dyslipidemia   . Essential hypertension, benign   .  Fibromyalgia   . Frequent falls   . GERD (gastroesophageal reflux disease)   . Hearing loss of left ear   . History of fractured rib   . History of TIA (transient ischemic attack)   . Irritable bowel   . Lumbar arthropathy ([place]) 10/04/2016   On CT scan, refer to ortho  . MS (multiple sclerosis) ([place])   . Osteoarthritis   . Paroxysmal supraventricular tachycardia ([place])   . Peripheral neuropathy (HCC)    MILD  . Personal history of radiation therapy 2015   BREAST CA  . Protein malnutrition ([place])   . Pure hypercholesterolemia   . Recurrent UTI   . Skin cancer   . SOB (shortness of breath)    SECONDARY TO REFLUX  . Solitary pulmonary nodule on lung CT 12/04/2015   3 mm LLL lung nodule June 2016, March 2017  . Tachycardia, paroxysmal (HCC)    Reportedly Paroxysmal Supraventricular Tachycardia, but unable to find documentation confirming this  . Thyroid nodule   . Vertigo   . Vitamin B 12 deficiency    Past Surgical History:  Procedure Laterality Date  . APPENDECTOMY    . AUGMENTATION MAMMAPLASTY  Right 1975   BREAST BREAST ONLY  . BLADDER TACKING     X 3  . BREAST LUMPECTOMY    . BREAST SURGERY    . CARDIAC CATHETERIZATION  10/2001   Normal coronary arteries with the exception of 20% proximal D1  . CHOLECYSTECTOMY    . COLONOSCOPY WITH PROPOFOL N/A 01/21/2015   Procedure: COLONOSCOPY WITH PROPOFOL;  Surgeon: Josefine Class, MD;  Location: [facility] ENDOSCOPY;  Service: Endoscopy;  Laterality: N/A;  . ESOPHAGOGASTRODUODENOSCOPY N/A 01/21/2015   Procedure: ESOPHAGOGASTRODUODENOSCOPY (EGD);  Surgeon: Josefine Class, MD;  Location: [facility] ENDOSCOPY;  Service: Endoscopy;  Laterality: N/A;  . EYE SURGERIES     WITH BUCKLE DETACHMENT OF THE RETINA  . heart monitor     Not being used... Patient can not have a MRI  . HEMORRHOID SURGERY     WITH RECONSTRUCTION  . MASTECTOMY Right 1970   SUBCUTANEOUS - NO BREAST CANCER  . TONSILLECTOMY    . TOTAL ABDOMINAL HYSTERECTOMY W/ BILATERAL SALPINGOOPHORECTOMY    . TYMPANOPLASTY     Family History  Problem Relation Age of Onset  . ALS Mother   . Kidney disease Sister   . Arthritis Sister   . Aneurysm Brother   . Heart disease Daughter   . Colon cancer    . Breast cancer Neg Hx   . Bladder Cancer Neg Hx   . Prostate cancer Neg Hx    Social History  Substance Use Topics  . Smoking status: Never Smoker  . Smokeless tobacco: Never Used  . Alcohol use No   Interim medical history since last visit reviewed. Allergies and medications reviewed  Review of Systems Per HPI unless specifically indicated above     Objective:    BP 122/82   Pulse 77   Temp 98.3 F (36.8 C) (Oral)   Resp 14   Wt 153 lb 6.4 oz (69.6 kg)   SpO2 97%   BMI 29.96 kg/m   Wt Readings from Last 3 Encounters:  10/06/16 153 lb (69.4 kg)  10/04/16 153 lb 6.4 oz (69.6 kg)  10/02/16 152 lb 11.2 oz (69.3 kg)    Physical Exam  Constitutional: She appears well-developed and well-nourished. No distress.  Elderly female  HENT:  Head: Normocephalic and  atraumatic.  Eyes: EOM are normal. No  scleral icterus.  Neck: No thyromegaly present.  Cardiovascular: Normal rate, regular rhythm and normal heart sounds.   No murmur heard. Pulmonary/Chest: Effort normal and breath sounds normal. No respiratory distress. She has no wheezes.  Abdominal: Soft. Bowel sounds are normal. She exhibits no distension.  Musculoskeletal: Normal range of motion. She exhibits no edema.  Neurological: She is alert. She exhibits normal muscle tone.  Skin: Skin is warm and dry. She is not diaphoretic. No pallor.  Thickened toenails  Psychiatric: She has a normal mood and affect. Her behavior is normal. Judgment and thought content normal.   Diabetic Foot Form - Detailed   Diabetic Foot Exam - detailed Diabetic Foot exam was performed with the following findings:  Yes 10/13/2016  7:03 PM  Visual Foot Exam completed.:  Yes  Are the toenails ingrown?:  No Normal Range of Motion:  Yes Pulse Foot Exam completed.:  Yes  Right Dorsalis Pedis:  Present Left Dorsalis Pedis:  Present  Sensory Foot Exam Completed.:  Yes Swelling:  No Semmes-Weinstein Monofilament Test R Site 1-Great Toe:  Pos L Site 1-Great Toe:  Pos  R Site 4:  Pos L Site 4:  Pos  R Site 5:  Pos L Site 5:  Pos           Assessment & Plan:   Problem List Items Addressed This Visit      Cardiovascular and Mediastinum   Hypertension goal BP (blood pressure) < 140/90 - Primary (Chronic)    Excellent control today on the new med      Coronary artery calcification seen on CAT scan (Chronic)    No chest pain, continue aspirin; control BP and sugar and lipids        Digestive   Stricture and stenosis of esophagus    Refer to GI        Endocrine   DM (diabetes mellitus) type II controlled, neurological manifestation ([place])    Foot exam by MD; check glucose and A1c      Relevant Orders   Hemoglobin A1c (Completed)   Microalbumin / creatinine urine ratio (Completed)   Ambulatory referral to  Podiatry     Nervous and Auditory   Dementia    Stable on Namenda        Musculoskeletal and Integument   Lumbar arthropathy (HCC)    Refer to ortho      Relevant Orders   Ambulatory referral to Orthopedic Surgery     Other   Medication monitoring encounter    Check labs      Relevant Orders   COMPLETE METABOLIC PANEL WITH GFR (Completed)   Intraductal carcinoma of breast    Managed by specialist      Hypercholesteremia    Check lipids; statin; try to limit saturated fats      Relevant Orders   Lipid panel (Completed)   History of right mastectomy    20 years ago due to cystitis       Other Visit Diagnoses    Onychogryphosis       refer to podiatrist   Relevant Orders   Ambulatory referral to Podiatry      Follow up plan: Return in about 3 months (around 01/04/2017) for visit and fasting labs.  An after-visit summary was printed and given to the patient at [place].  Please see the patient instructions which may contain other information and recommendations beyond what is mentioned above in the assessment and plan.  Meds ordered this encounter  Medications  . DISCONTD: donepezil (ARICEPT) 5 MG tablet    Sig: Take 5 mg by mouth at bedtime.  Marland Kitchen ibuprofen (ADVIL,MOTRIN) 800 MG tablet    Sig: Take 800 mg by mouth every 8 (eight) hours as needed.    Orders Placed This Encounter  Procedures  . COMPLETE METABOLIC PANEL WITH GFR  . Lipid panel  . Hemoglobin A1c  . Microalbumin / creatinine urine ratio  . Ambulatory referral to Podiatry  . Ambulatory referral to Orthopedic Surgery

## 2016-10-04 NOTE — Assessment & Plan Note (Signed)
Managed by specialist 

## 2016-10-04 NOTE — Assessment & Plan Note (Signed)
Refer to ortho.

## 2016-10-04 NOTE — Assessment & Plan Note (Signed)
Check labs 

## 2016-10-04 NOTE — Assessment & Plan Note (Signed)
Refer to GI 

## 2016-10-05 ENCOUNTER — Telehealth: Payer: Self-pay

## 2016-10-05 ENCOUNTER — Other Ambulatory Visit: Payer: Self-pay | Admitting: Family Medicine

## 2016-10-05 LAB — MICROALBUMIN / CREATININE URINE RATIO
Creatinine, Urine: 174 mg/dL (ref 20–320)
MICROALB UR: 1.9 mg/dL
MICROALB/CREAT RATIO: 11 ug/mg{creat} (ref <30)

## 2016-10-05 MED ORDER — ATORVASTATIN CALCIUM 40 MG PO TABS: 40.0000 mg | ORAL_TABLET | Freq: Every day | ORAL | 1 refills | Status: DC

## 2016-10-05 NOTE — Telephone Encounter (Signed)
Called patient to go over lab results. Left a message to have her call me back regarding her results. Since pt has hearing problems, I will try to reach her before I leave since Dr. Sanda Klein would like to have her statin medication dosage  Increased.

## 2016-10-06 ENCOUNTER — Emergency Department
Admission: EM | Admit: 2016-10-06 | Discharge: 2016-10-06 | Discharge status: Against Medical Advice | Payer: Medicare HMO | Attending: Emergency Medicine | Admitting: Emergency Medicine

## 2016-10-06 ENCOUNTER — Encounter: Payer: Self-pay | Admitting: Emergency Medicine

## 2016-10-06 DIAGNOSIS — Z7984 Long term (current) use of oral hypoglycemic drugs: Secondary | ICD-10-CM | POA: Insufficient documentation

## 2016-10-06 DIAGNOSIS — Z7982 Long term (current) use of aspirin: Secondary | ICD-10-CM | POA: Diagnosis not present

## 2016-10-06 DIAGNOSIS — E119 Type 2 diabetes mellitus without complications: Secondary | ICD-10-CM | POA: Insufficient documentation

## 2016-10-06 DIAGNOSIS — Z5321 Procedure and treatment not carried out due to patient leaving prior to being seen by health care provider: Secondary | ICD-10-CM | POA: Insufficient documentation

## 2016-10-06 DIAGNOSIS — M545 Low back pain: Secondary | ICD-10-CM | POA: Diagnosis not present

## 2016-10-06 NOTE — ED Triage Notes (Signed)
Patient leaves without being seen. Husband driving.

## 2016-10-06 NOTE — ED Triage Notes (Addendum)
Low back and bilateral hip pain x 3 months. States has had falls. Patient states she has been examined by private provider and told she has a pinched nerve.

## 2016-10-06 NOTE — ED Notes (Signed)
Flex PA requests patient be seen in main ED.

## 2016-10-08 ENCOUNTER — Telehealth: Payer: Self-pay | Admitting: Family Medicine

## 2016-10-08 NOTE — Telephone Encounter (Signed)
Pt daughter is asking for lab results.

## 2016-10-09 ENCOUNTER — Telehealth: Payer: Self-pay

## 2016-10-09 DIAGNOSIS — M5416 Radiculopathy, lumbar region: Secondary | ICD-10-CM | POA: Diagnosis not present

## 2016-10-09 DIAGNOSIS — M545 Low back pain: Secondary | ICD-10-CM | POA: Diagnosis not present

## 2016-10-09 NOTE — Telephone Encounter (Signed)
Spoke with pt daughter ; went over lab results and mention to her Dr. lada up her statin dosage to 40 mg, same medication, same route. Pt daughter understood.

## 2016-10-10 ENCOUNTER — Encounter: Payer: Self-pay | Admitting: *Deleted

## 2016-10-10 ENCOUNTER — Other Ambulatory Visit: Payer: Self-pay | Admitting: *Deleted

## 2016-10-10 NOTE — Patient Outreach (Signed)
[facility] [facility]) Care Management  10/10/2016   [name] 10-29-1929 [id number]    RN Health Coach telephone call to patient.  [employer] compliance verified. Per patient she has not been checking her blood sugars. Patient stated the Dr did it when I was last there. Patient has been unable to exercise due to lower back and rectal pain that is ongoing. Per patient she is eating meals from meals on wheels. Patient A1C is 6.6. Patient is on oral agents.   Current Medications:  Current Outpatient Prescriptions  Medication Sig Dispense Refill  . acetaminophen (TYLENOL) 500 MG tablet Take by mouth.    Marland Kitchen amLODipine (NORVASC) 2.5 MG tablet Take 1 tablet (2.5 mg total) by mouth daily. 30 tablet 0  . aspirin EC 81 MG tablet Take 1 tablet (81 mg total) by mouth daily. 30 tablet 11  . atorvastatin (LIPITOR) 40 MG tablet Take 1 tablet (40 mg total) by mouth at bedtime. 30 tablet 1  . b complex vitamins tablet Take 1 tablet by mouth daily.    . Carboxymethylcell-Hypromellose (GENTEAL OP) Apply 1 drop to eye. Each eye as needed    . Cholecalciferol (VITAMIN D3) 50000 units CAPS Take 5,000 Int'l Units/day by mouth once a week. Reported on 01/25/2016 1 capsule   . Cyanocobalamin (VITAMIN B-12) 1000 MCG SUBL     . estradiol (ESTRACE VAGINAL) 0.1 MG/GM vaginal cream Place 1 Applicatorful vaginally at bedtime.    . ferrous sulfate 325 (65 FE) MG tablet Take 1 tablet (325 mg total) by mouth daily with breakfast. 90 tablet 1  . fluticasone (FLONASE) 50 MCG/ACT nasal spray SPRAY 2 SPRAYS EACH INTO BOTH NOSTRILS ONCE DAILY EACH NIGHT.  5  . hydrocortisone (PROCTO-PAK) 1 % CREA Apply to rectum twice a day 28.35 g 0  . ibuprofen (ADVIL,MOTRIN) 800 MG tablet Take 800 mg by mouth every 8 (eight) hours as needed.    . loperamide (IMODIUM A-D) 2 MG tablet Take 2 mg by mouth 4 (four) times daily as needed for diarrhea or loose stools.    . meclizine (ANTIVERT) 25 MG tablet Take 1 tablet (25 mg total) by  mouth every 8 (eight) hours as needed for dizziness. 30 tablet 2  . memantine (NAMENDA) 10 MG tablet     . metFORMIN (GLUCOPHAGE) 500 MG tablet Take 1 tablet (500 mg total) by mouth 2 (two) times daily with a meal. (Patient taking differently: Take 500 mg by mouth daily. ) 180 tablet 1  . mirabegron ER (MYRBETRIQ) 25 MG TB24 tablet Take 25 mg by mouth daily.    . Misc. Devices ([place]) MISC Rolling walker; knee sprain, arthritis; LON 99 months 1 each 0  . ondansetron (ZOFRAN) 4 MG tablet Take 4 mg by mouth every 8 (eight) hours as needed for nausea or vomiting. Reported on 12/22/2015    . polyethylene glycol powder (GLYCOLAX/MIRALAX) powder Take by mouth.    . Probiotic Product (PROBIOTIC COLON SUPPORT) CAPS Take 1 capsule by mouth daily.     . sertraline (ZOLOFT) 50 MG tablet Take 1 tablet (50 mg total) by mouth daily. 90 tablet 3  . Simethicone (GAS-X EXTRA STRENGTH) 125 MG CAPS Take 1 capsule by mouth as needed. Reported on 11/24/2015    . trimethoprim (TRIMPEX) 100 MG tablet Take 1 tablet (100 mg total) by mouth daily. (Patient not taking: Reported on 10/02/2016) 30 tablet 3   No current facility-administered medications for this visit.     Functional Status:  In your present state of health, do you have any difficulty performing the following activities: 10/10/2016 10/04/2016  Hearing? [name]  Vision? Y Y  Difficulty concentrating or making decisions? [name]  Walking or climbing stairs? Y Y  Dressing or bathing? Y -  Doing errands, shopping? N N  Preparing Food and eating ? - -  Using the Toilet? - -  In the past six months, have you accidently leaked urine? - -  Do you have problems with loss of bowel control? - -  Managing your Medications? - -  Managing your Finances? - -  Housekeeping or managing your Housekeeping? - -  Some recent data might be hidden    Fall/Depression Screening: PHQ 2/9 Scores 10/10/2016 10/04/2016 09/11/2016 09/03/2016 07/31/2016 07/03/2016 06/25/2016   PHQ - 2 Score 0 0 0 0 0 1 1  PHQ- 9 Score - - - - - - -   THN CM Care Plan Problem One   Flowsheet Row Most Recent Value  Care Plan Problem One  Knowledge deficit in self management of diabetes  Role Documenting the Problem One  [place] for Problem One  Active  THN Long Term Goal (31-90 days)  Patient will report checking and recording  blood sugars within the next 90 days  THN Long Term Goal Start Date  10/10/16  Interventions for Problem One Long Term Goal  Rn sent educatioanl materialon why check your blood sugars. How to check your blood sugars and when to check your blood sugars. Rn will foollow up with out reach call  THN CM Short Term Goal #1 (0-30 days)  Patient will verbalize reading information on sick days within the the next 30 days  THN CM Short Term Goal #1 Start Date  10/10/16  Interventions for Short Term Goal #1  RN sent EMMI  educational material on sick days. RN will follow up with outreach call  THN CM Short Term Goal #2 (0-30 days)  Patient will report checking blood sugar daily and recording results with the next 30 days  THN CM Short Term Goal #2 Start Date  10/10/16  Interventions for Short Term Goal #2  RN reminded the patient of checking her blood sugar each morning. RN explained to patient to write it down with a color marker since patient having some difficult seeing it.  THN CM Short Term Goal #3 (0-30 days)  Patient will not have any falls within the next 30 days  THN CM Short Term Goal #3 Start Date  10/10/16  Interventions for Short Tern Goal #3  RN discussed with patient about fall safety. Patient had a ct scan of the head. Patient is also following up with a neurologist  THN CM Short Term Goal #4 (0-30 days)  Patient will be able to recognize cold, flu, and pneumonia symptoms within the next 30 days  THN CM Short Term Goal #4 Start Date  10/10/16  [facility] CM Short Term Goal #4 Met Date  10/10/16  Interventions for Short Term Goal #4  RN sent EMMI  eduicational information on cold, flu and pneumonia. Patient received the flu shot. Per patient unable to take pneumonia shot. RN follow up with discussion        Assessment:  This patient is not able to progress through the care plan Patient does not check blood sugars despite a community nurse was sent and Health Coach reiterating the need.   Plan:  Case closure due to this consumer  is unable to progress through the care plan

## 2016-10-12 ENCOUNTER — Telehealth: Payer: Self-pay | Admitting: Family Medicine

## 2016-10-12 DIAGNOSIS — R911 Solitary pulmonary nodule: Secondary | ICD-10-CM

## 2016-10-12 NOTE — Assessment & Plan Note (Signed)
Due for rescan of lung

## 2016-10-12 NOTE — Telephone Encounter (Signed)
Due for chest CT to f/u pulmonary nodule; ordered

## 2016-10-16 ENCOUNTER — Other Ambulatory Visit: Payer: Self-pay | Admitting: Orthopedic Surgery

## 2016-10-16 DIAGNOSIS — G309 Alzheimer's disease, unspecified: Secondary | ICD-10-CM | POA: Diagnosis not present

## 2016-10-16 DIAGNOSIS — F0280 Dementia in other diseases classified elsewhere without behavioral disturbance: Secondary | ICD-10-CM | POA: Diagnosis not present

## 2016-10-16 DIAGNOSIS — M5416 Radiculopathy, lumbar region: Secondary | ICD-10-CM

## 2016-10-16 DIAGNOSIS — F0150 Vascular dementia without behavioral disturbance: Secondary | ICD-10-CM | POA: Diagnosis not present

## 2016-10-16 DIAGNOSIS — G4700 Insomnia, unspecified: Secondary | ICD-10-CM | POA: Diagnosis not present

## 2016-10-16 DIAGNOSIS — F418 Other specified anxiety disorders: Secondary | ICD-10-CM | POA: Diagnosis not present

## 2016-10-16 DIAGNOSIS — E538 Deficiency of other specified B group vitamins: Secondary | ICD-10-CM | POA: Diagnosis not present

## 2016-10-18 ENCOUNTER — Telehealth: Payer: Self-pay

## 2016-10-18 DIAGNOSIS — J029 Acute pharyngitis, unspecified: Secondary | ICD-10-CM | POA: Diagnosis not present

## 2016-10-18 NOTE — Telephone Encounter (Signed)
Patient was informed that she has been scheduled to have her CT chest on the same day as her CT Lumbar, which is on Tuesday, October 23, 2016 @ 9am at [facility].

## 2016-10-22 ENCOUNTER — Other Ambulatory Visit: Payer: Self-pay

## 2016-10-22 DIAGNOSIS — Z5181 Encounter for therapeutic drug level monitoring: Secondary | ICD-10-CM

## 2016-10-22 DIAGNOSIS — E7800 Pure hypercholesterolemia, unspecified: Secondary | ICD-10-CM

## 2016-10-22 MED ORDER — ATORVASTATIN CALCIUM 40 MG PO TABS: 40.0000 mg | ORAL_TABLET | Freq: Every day | ORAL | 0 refills | Status: DC

## 2016-10-22 NOTE — Telephone Encounter (Signed)
Please see lab result note from 10/04/16 and order upcoming labs (due 6-8 weeks after increase in statin dose); thank you I'll send Rx as requested

## 2016-10-22 NOTE — Telephone Encounter (Signed)
Ins requires 90 day supply

## 2016-10-23 ENCOUNTER — Ambulatory Visit: Payer: Commercial Managed Care - HMO

## 2016-10-23 ENCOUNTER — Telehealth: Payer: Self-pay

## 2016-10-23 ENCOUNTER — Ambulatory Visit
Admission: RE | Admit: 2016-10-23 | Discharge: 2016-10-23 | Disposition: A | Discharge status: Home / Self Care | Payer: Medicare HMO | Source: Ambulatory Visit | Attending: Family Medicine | Admitting: Family Medicine

## 2016-10-23 DIAGNOSIS — Z981 Arthrodesis status: Secondary | ICD-10-CM | POA: Diagnosis not present

## 2016-10-23 DIAGNOSIS — Z9889 Other specified postprocedural states: Secondary | ICD-10-CM | POA: Insufficient documentation

## 2016-10-23 DIAGNOSIS — M5416 Radiculopathy, lumbar region: Secondary | ICD-10-CM | POA: Diagnosis not present

## 2016-10-23 DIAGNOSIS — R911 Solitary pulmonary nodule: Secondary | ICD-10-CM | POA: Insufficient documentation

## 2016-10-23 DIAGNOSIS — I2510 Atherosclerotic heart disease of native coronary artery without angina pectoris: Secondary | ICD-10-CM | POA: Insufficient documentation

## 2016-10-23 DIAGNOSIS — M5126 Other intervertebral disc displacement, lumbar region: Secondary | ICD-10-CM | POA: Diagnosis not present

## 2016-10-23 DIAGNOSIS — M48061 Spinal stenosis, lumbar region without neurogenic claudication: Secondary | ICD-10-CM | POA: Insufficient documentation

## 2016-10-23 NOTE — Telephone Encounter (Signed)
Pt daughter called and she states they did not have anything on the record stating she was suppose to get CT chest WO contrast. Reviewed the chart and saw it states they have arrived and it shows up.  Pt daughter state admission was not able to find it but after couple of mins on the phone with the pt daughter someone spoke with her and they have the paper work for CT lumbar spine WO contrast and CT chest WO contrast.

## 2016-10-29 ENCOUNTER — Other Ambulatory Visit: Payer: Self-pay | Admitting: Family Medicine

## 2016-10-31 ENCOUNTER — Ambulatory Visit: Payer: Commercial Managed Care - HMO

## 2016-10-31 DIAGNOSIS — M5136 Other intervertebral disc degeneration, lumbar region: Secondary | ICD-10-CM | POA: Diagnosis not present

## 2016-10-31 DIAGNOSIS — M5416 Radiculopathy, lumbar region: Secondary | ICD-10-CM | POA: Diagnosis not present

## 2016-11-05 ENCOUNTER — Encounter: Payer: Self-pay | Admitting: Podiatry

## 2016-11-05 ENCOUNTER — Ambulatory Visit (INDEPENDENT_AMBULATORY_CARE_PROVIDER_SITE_OTHER): Payer: Medicare HMO | Admitting: Podiatry

## 2016-11-05 VITALS — Ht 60.0 in | Wt 153.0 lb

## 2016-11-05 DIAGNOSIS — M792 Neuralgia and neuritis, unspecified: Secondary | ICD-10-CM

## 2016-11-05 DIAGNOSIS — M79676 Pain in unspecified toe(s): Secondary | ICD-10-CM | POA: Diagnosis not present

## 2016-11-05 DIAGNOSIS — B351 Tinea unguium: Secondary | ICD-10-CM

## 2016-11-05 NOTE — Progress Notes (Signed)
   Subjective:    Patient ID: [name], female    DOB: 1930-07-22, 83 y.o.   MRN: [id number]  HPI this patient presents the office with chief complaint of long thick painful nails. Patient states the nails are painful walking and wearing her shoes. She does not have the ability to trim her nails herself. She also says she has developed a extremely painful area on the top of her left foot. He says this area has been painful for the last few weeks and is increasing in severity. She says when she walks and wears her shoes. There is a sharp shooting pain noted through the top of her foot. She denies any history of trauma or injury to the foot. She presents the office today for an evaluation and treatment of these  Conditions.    Review of Systems  All other systems reviewed and are negative.      Objective:   Physical Exam GENERAL APPEARANCE: Alert, conversant. Appropriately groomed. No acute distress.  VASCULAR: Pedal pulses are  palpable at  [facility] and PT bilateral.  Capillary refill time is immediate to all digits,  Normal temperature gradient.   NEUROLOGIC: sensation is normal to 5.07 monofilament at 5/5 sites bilateral.  Light touch is intact bilateral, Muscle strength normal.  MUSCULOSKELETAL: acceptable muscle strength, tone and stability bilateral.  Intrinsic muscluature intact bilateral.  Rectus appearance of foot and digits noted bilateral. Midfoot exostosis B/L .  Redness and swelling noted at the level of bony prominence left foot.  DERMATOLOGIC: skin color, texture, and turgor are within normal limits.  No preulcerative lesions or ulcers  are seen, no interdigital maceration noted.  No open lesions present.   No drainage noted.  NAILS  Thick disfigured discolored nails both feet.         Assessment & Plan:  Onychomycosis  Neuritis secondary arthritic changes left foot.  IE  Debride nails.  Discussed her neuritis and instructed her to change the manner her shoelaces are  laced.  RTC 3 months.   Gardiner Barefoot DPM

## 2016-11-12 DIAGNOSIS — M5416 Radiculopathy, lumbar region: Secondary | ICD-10-CM | POA: Diagnosis not present

## 2016-11-23 ENCOUNTER — Ambulatory Visit: Payer: Medicare HMO | Attending: Urology | Admitting: Physical Therapy

## 2016-11-23 ENCOUNTER — Encounter: Payer: Self-pay | Admitting: Physical Therapy

## 2016-11-23 DIAGNOSIS — G8929 Other chronic pain: Secondary | ICD-10-CM

## 2016-11-23 DIAGNOSIS — R296 Repeated falls: Secondary | ICD-10-CM | POA: Diagnosis not present

## 2016-11-23 DIAGNOSIS — M6281 Muscle weakness (generalized): Secondary | ICD-10-CM | POA: Diagnosis not present

## 2016-11-23 DIAGNOSIS — M791 Myalgia, unspecified site: Secondary | ICD-10-CM

## 2016-11-23 DIAGNOSIS — M545 Low back pain: Secondary | ICD-10-CM | POA: Diagnosis not present

## 2016-11-23 DIAGNOSIS — M533 Sacrococcygeal disorders, not elsewhere classified: Secondary | ICD-10-CM

## 2016-11-23 NOTE — Therapy (Addendum)
[place] MAIN [facility] SERVICES [address] [place], [place], [address] Phone: [phone]   Fax:  [phone]  Physical Therapy Evaluation  Patient Details  Name: [name] MRN: [id number] Date of Birth: July 14, 1930 Referring Provider: Ernestine Conrad  Encounter Date: 11/23/2016    Past Medical History:  Diagnosis Date  . Anxiety and depression   . Breast cancer ([place]) 2015   LT LUMPECTOMY  . Carpal tunnel syndrome   . Chest pain    SECONDARY TO GERD  . Chronic interstitial cystitis   . Chronic right hip pain   . Depression   . Diabetes mellitus without complication ([place])   . Diverticulosis   . Dyslipidemia   . Essential hypertension, benign   . Fibromyalgia   . Frequent falls   . GERD (gastroesophageal reflux disease)   . Hearing loss of left ear   . History of fractured rib   . History of TIA (transient ischemic attack)   . Irritable bowel   . Lumbar arthropathy ([place]) 10/04/2016   On CT scan, refer to ortho  . MS (multiple sclerosis) ([place])   . Osteoarthritis   . Paroxysmal supraventricular tachycardia ([place])   . Peripheral neuropathy    MILD  . Personal history of radiation therapy 2015   BREAST CA  . Protein malnutrition ([place])   . Pure hypercholesterolemia   . Recurrent UTI   . Skin cancer   . SOB (shortness of breath)    SECONDARY TO REFLUX  . Solitary pulmonary nodule on lung CT 12/04/2015   3 mm LLL lung nodule June 2016, March 2017  . Tachycardia, paroxysmal (HCC)    Reportedly Paroxysmal Supraventricular Tachycardia, but unable to find documentation confirming this  . Thyroid nodule   . Vertigo   . Vitamin B 12 deficiency     Past Surgical History:  Procedure Laterality Date  . APPENDECTOMY    . AUGMENTATION MAMMAPLASTY Right 1975   BREAST BREAST ONLY  . BLADDER TACKING     X 3  . BREAST LUMPECTOMY    . BREAST SURGERY    . CARDIAC CATHETERIZATION  10/2001   Normal coronary arteries with the exception of 20%  proximal D1  . CHOLECYSTECTOMY    . COLONOSCOPY WITH PROPOFOL N/A 01/21/2015   Procedure: COLONOSCOPY WITH PROPOFOL;  Surgeon: Josefine Class, MD;  Location: [facility] ENDOSCOPY;  Service: Endoscopy;  Laterality: N/A;  . ESOPHAGOGASTRODUODENOSCOPY N/A 01/21/2015   Procedure: ESOPHAGOGASTRODUODENOSCOPY (EGD);  Surgeon: Josefine Class, MD;  Location: [facility] ENDOSCOPY;  Service: Endoscopy;  Laterality: N/A;  . EYE SURGERIES     WITH BUCKLE DETACHMENT OF THE RETINA  . heart monitor     Not being used... Patient can not have a MRI  . HEMORRHOID SURGERY     WITH RECONSTRUCTION  . MASTECTOMY Right 1970   SUBCUTANEOUS - NO BREAST CANCER  . TONSILLECTOMY    . TOTAL ABDOMINAL HYSTERECTOMY W/ BILATERAL SALPINGOOPHORECTOMY    . TYMPANOPLASTY      There were no vitals filed for this visit.       Subjective Assessment - 11/26/16 0817    Subjective 1)  rectal pain w/ LBP that started before Christmas 2017 after pt had a vaginal ultrasound. 10/10 with walking, bowel movements, diarrhea. When not walking, pain is 5/10. Pt got an epidural for this pain at [facility] on her L side and it has not helped her rectal pain ,only caused it to move from both sides to  the R side only. Since this pain, pt has fell 2 x.   2) CLBP: fusion surgery 10 years ago. constant.  Ocassional radiating pain. Pain occurs with walking,standing up, and putting pressure on her leg.    Pertinent History Arthritis, neuropathy, MS, Breast CA, lumbar surgery    Patient Stated Goals not hurt when she steps on her leg            [facility] PT Assessment - 11/26/16 0817      Assessment   Medical Diagnosis Interstitial cystitis   Referring Provider McG[place]an     Precautions   Precautions None     Restrictions   Weight Bearing Restrictions No     Balance Screen   Has the patient fallen in the past 6 months No     Observation/Other Assessments   Other Surveys  --  ODI     Single Leg Stance   Comments B SLS : lack of  confidence, unable to perform     Posture/Postural Control   Posture Comments lumbopelvic perturbations with leg movements in hooklying      Strength   Overall Strength --  hip abd L 3-/5   Overall Strength Comments L hip abd 3-/5      Palpation   SI assessment  L PSIS more posterior, limited arthrokinematics, flinching tenderness and tensions along lateral border S 2-4.   post-Tx: no tenderness, less tensions, more symmetrical pelv   Palpation comment scar restriction along lumbar spine                    [facility] Adult PT Treatment/Exercise - 11/26/16 0817      Ambulation/Gait   Gait velocity 0.4 m/s w/ rollator    Gait Comments R foot drag, limited hip flexion     Therapeutic Activites    Therapeutic Activities --  see pt instructions     Moist Heat Therapy   Moist Heat Location Hip     Manual Therapy   Manual therapy comments long axis distraction, PA mobs Grade I along lateral border of sacrum, rotation mob, MWM with hip ER/abd                      Plan - 11/26/16 0942    Clinical Impression Statement Pt is a 83 yo female who reports rectal pain that occurred following a vaginal [place] procedure 4 months ago and CLBP. These deficits impact her mobility and QOL. Pt uses a rollator at home and in the community. Pt 's clinical presentations include pelvic obliquities, hip weakness, gait deviations, and limited scar mobility in the lumbar area. Following Tx today, pt presented with a more symmetrically aligned pelvic girdle, no pain with walking, and increased gait speed from 0.48 m/s to 0.64 m/s with rollator. Plan to address scar restrictions at next session.    Rehab Potential Good   Clinical Impairments Affecting Rehab Potential Hx of falls, hysterectomy, arthritis, neuropathy, MS, Breast CA, lumbar surgery, uses a rollator    PT Frequency 1x / week   PT Treatment/Interventions ADLs/Self Care Home Management;Aquatic Therapy;Electrical  Stimulation;Neuromuscular re-education;Moist Heat;Balance training;Therapeutic exercise;Therapeutic activities;Functional mobility training;Stair training;Gait training;Patient/family education;Manual techniques;Manual lymph drainage;Scar mobilization;Taping   Consulted and Agree with Plan of Care Patient      Patient will benefit from skilled therapeutic intervention in order to improve the following deficits and impairments:  Abnormal gait, Pain, Increased muscle spasms, Decreased range of motion, Decreased safety awareness, Postural dysfunction, Improper body mechanics,  Decreased strength, Decreased activity tolerance, Decreased balance, Decreased mobility, Decreased scar mobility, Decreased endurance, Impaired sensation, Decreased skin integrity, Decreased coordination, Difficulty walking, Increased fascial restricitons, Hypomobility  Visit Diagnosis: Sacrococcygeal disorders, not elsewhere classified  Muscle weakness (generalized)  Chronic bilateral low back pain without sciatica  Falls frequently  Myalgia      G-Codes - 2016-11-29 0835    Functional Assessment Tool Used (Outpatient Only) ODI to complete at next visit    Functional Limitation Mobility: Walking and moving around   Mobility: Walking and Moving Around Current Status [phone]) At least 60 percent but less than 80 percent impaired, limited or restricted   Mobility: Walking and Moving Around Goal Status [phone]) At least 40 percent but less than 60 percent impaired, limited or restricted       Problem List Patient Active Problem List   Diagnosis Date Noted  . Lumbar arthropathy ([place]) 10/04/2016  . Vertigo, aural, left 07/31/2016  . Rectal pain 06/25/2016  . External hemorrhoid 06/18/2016  . Head injury due to trauma 05/09/2016  . Neoplasm of uncertain behavior of skin 05/09/2016  . Hemorrhoids 04/24/2016  . Dementia 04/24/2016  . Medication monitoring encounter 04/03/2016  . Confusion 04/03/2016  . Gait  abnormality 04/03/2016  . Skin lump of leg 04/03/2016  . Pain in right knee 02/15/2016  . Pain and swelling of right knee 02/13/2016  . Right knee sprain 02/13/2016  . Solitary pulmonary nodule on lung CT 12/04/2015  . Generalized degenerative joint disease of hand 11/21/2015  . Triggering of digit 11/21/2015  . Snapping thumb syndrome 11/21/2015  . Pain, joint, hand 11/11/2015  . Coronary artery calcification seen on CAT scan 11/11/2015  . Multiple sclerosis ([place]) 11/11/2015  . Chronic pain of multiple joints 09/02/2015  . Right lower quadrant abdominal pain 08/02/2015  . Microscopic hematuria 07/12/2015  . Recurrent UTI 04/09/2015  . Carpal tunnel syndrome 01/12/2014  . DD (diverticular disease) 01/12/2014  . Ductal carcinoma in situ of left breast 01/12/2014  . Hypercholesteremia 01/12/2014  . DS (disseminated sclerosis) ([place]) 01/12/2014  . Arthritis, degenerative 01/12/2014  . Thyroid nodule 01/12/2014  . Cyanocobalamine deficiency (non anemic) 01/12/2014  . Anxiety and depression 01/12/2014  . Intraductal carcinoma of breast 01/12/2014  . History of right mastectomy 01/12/2014  . DM (diabetes mellitus) type II controlled, neurological manifestation ([place]) 06/26/2010  . Depression, major, recurrent, in partial remission ([place]) 06/26/2010  . Frequent falls 06/26/2010  . Stricture and stenosis of esophagus 06/26/2010  . Gastro-esophageal reflux disease without esophagitis 06/26/2010  . Fibromyalgia 06/26/2010  . Chronic interstitial cystitis 06/26/2010  . H/O malignant neoplasm of breast 06/26/2010  . Hypertension goal BP (blood pressure) < 140/90 06/01/2009  . H/O paroxysmal supraventricular tachycardia 06/01/2009  . Paroxysmal supraventricular tachycardia ([place]) 06/01/2009  . Irritable bowel syndrome with constipation and diarrhea 06/16/2008  . Diarrhea, functional 06/16/2008  . Diarrhea, unspecified 06/16/2008    Jerl Mina ,PT, DPT, E-RYT  11/26/2016, 9:54  AM  [place] MAIN [facility] SERVICES [address] [place], [place], [address] Phone: [phone]   Fax:  [phone]  Name: [name] MRN: [id number] Date of Birth: 1930/04/11

## 2016-11-23 NOTE — Patient Instructions (Signed)
Inhale , exhale  Move L knee out ~ 45 degrees when laying on back  10x 3  __  Seated figure -4 stretch 5 breaths with each ankle over thigh  __

## 2016-11-26 ENCOUNTER — Encounter: Payer: Self-pay | Admitting: Family Medicine

## 2016-11-26 ENCOUNTER — Ambulatory Visit (INDEPENDENT_AMBULATORY_CARE_PROVIDER_SITE_OTHER): Payer: Medicare HMO | Admitting: Family Medicine

## 2016-11-26 VITALS — BP 110/62 | HR 78 | Temp 98.6°F | Resp 14 | Wt 154.4 lb

## 2016-11-26 DIAGNOSIS — E1140 Type 2 diabetes mellitus with diabetic neuropathy, unspecified: Secondary | ICD-10-CM | POA: Diagnosis not present

## 2016-11-26 DIAGNOSIS — H6123 Impacted cerumen, bilateral: Secondary | ICD-10-CM | POA: Diagnosis not present

## 2016-11-26 NOTE — Patient Instructions (Signed)
Avoid Q-tips and other items in the ear except for your hearing aide

## 2016-11-26 NOTE — Addendum Note (Signed)
Addended by: Jerl Mina on: 11/26/2016 09:57 AM   Modules accepted: Orders

## 2016-11-26 NOTE — Progress Notes (Signed)
BP 110/62   Pulse 78   Temp 98.6 F (37 C) (Oral)   Resp 14   Wt 154 lb 6.4 oz (70 kg)   SpO2 97%   BMI 30.15 kg/m    Subjective:    Patient ID: [name], female    DOB: [birth year], 83 y.o.   MRN: [id number]  HPI: [name] is a 83 y.o. female  Chief Complaint  Patient presents with  . Cerumen Impaction   HPI She is here for cerumen; right ear, but wants both checked Blocked up, trouble with hearing in the right ear Uses hearing aide and she pushes  She uses Q-tips No fevers Little scratchy throat "from yelling at Dad all the time" says daughter Going to see doctor about hip tomorrow, right hip; ortho in [place] She had pelvic floor PT which worked well she says  Depression screen [facility] 2/9 11/26/2016 10/10/2016 10/04/2016 09/11/2016 09/03/2016  Decreased Interest 0 0 0 0 0  Down, Depressed, Hopeless 0 0 0 0 0  PHQ - 2 Score 0 0 0 0 0  Altered sleeping - - - - -  Tired, decreased energy - - - - -  Change in appetite - - - - -  Feeling bad or failure about yourself  - - - - -  Trouble concentrating - - - - -  Moving slowly or fidgety/restless - - - - -  Suicidal thoughts - - - - -  PHQ-9 Score - - - - -  Difficult doing work/chores - - - - -  Some recent data might be hidden    Relevant past medical, surgical, family and social history reviewed Past Medical History:  Diagnosis Date  . Anxiety and depression   . Breast cancer ([place]) 2015   LT LUMPECTOMY  . Carpal tunnel syndrome   . Chest pain    SECONDARY TO GERD  . Chronic interstitial cystitis   . Chronic right hip pain   . Depression   . Diabetes mellitus without complication ([place])   . Diverticulosis   . Dyslipidemia   . Essential hypertension, benign   . Fibromyalgia   . Frequent falls   . GERD (gastroesophageal reflux disease)   . Hearing loss of left ear   . History of fractured rib   . History of TIA (transient ischemic attack)   . Irritable bowel   . Lumbar arthropathy ([place]) 10/04/2016   On CT scan, refer to ortho  . MS (multiple sclerosis) ([place])   . Osteoarthritis   . Paroxysmal supraventricular tachycardia ([place])   . Peripheral neuropathy    MILD  . Personal history of radiation therapy 2015   BREAST CA  . Protein malnutrition ([place])   . Pure hypercholesterolemia   . Recurrent UTI   . Skin cancer   . SOB (shortness of breath)    SECONDARY TO REFLUX  . Solitary pulmonary nodule on lung CT 12/04/2015   3 mm LLL lung nodule June 2016, March 2017  . Tachycardia, paroxysmal (HCC)    Reportedly Paroxysmal Supraventricular Tachycardia, but unable to find documentation confirming this  . Thyroid nodule   . Vertigo   . Vitamin B 12 deficiency     Family History  Problem Relation Age of Onset  . ALS Mother   . Kidney disease Sister   . Arthritis Sister   . Aneurysm Brother   . Heart disease Daughter   . Colon cancer    . Breast cancer Neg  Hx   . Bladder Cancer Neg Hx   . Prostate cancer Neg Hx    Social History  Substance Use Topics  . Smoking status: Never Smoker  . Smokeless tobacco: Never Used  . Alcohol use No   Interim medical history since last visit reviewed. Allergies and medications reviewed  Review of Systems Per HPI unless specifically indicated above     Objective:    BP 110/62   Pulse 78   Temp 98.6 F (37 C) (Oral)   Resp 14   Wt 154 lb 6.4 oz (70 kg)   SpO2 97%   BMI 30.15 kg/m   Wt Readings from Last 3 Encounters:  11/26/16 154 lb 6.4 oz (70 kg)  11/05/16 153 lb (69.4 kg)  10/06/16 153 lb (69.4 kg)    Physical Exam  Constitutional: She appears well-developed and well-nourished. No distress.  HENT:  Light blue tube in place in the LEFT TM; clear serous fluid behind drum; right canal occluded by impacted cerumen; removed with curette in one piece; tolerated   Cardiovascular: Normal rate.   Pulmonary/Chest: Effort normal.  Neurological:  Mild neuropathy both feet distally; ambulatory with rolling walker   Diabetic Foot Form -  Detailed   Diabetic Foot Exam - detailed Diabetic Foot exam was performed with the following findings:  Yes 11/26/2016  1:40 PM  Visual Foot Exam completed.:  Yes  Are the toenails ingrown?:  No Normal Range of Motion:  Yes Pulse Foot Exam completed.:  Yes  Right Dorsalis Pedis:  Present Left Dorsalis Pedis:  Present  Sensory Foot Exam Completed.:  Yes Swelling:  No Semmes-Weinstein Monofilament Test R Site 1-Great Toe:  Pos L Site 1-Great Toe:  Pos  R Site 4:  Pos L Site 4:  Pos  R Site 5:  Pos L Site 5:  Pos      mild neuropathy in stocking distribution both feet   Results for orders placed or performed in visit on 10/04/16  COMPLETE METABOLIC PANEL WITH GFR  Result Value Ref Range   Sodium 142 135 - 146 mmol/L   Potassium 4.5 3.5 - 5.3 mmol/L   Chloride 108 98 - 110 mmol/L   CO2 24 20 - 31 mmol/L   Glucose, Bld 96 65 - 99 mg/dL   BUN 14 7 - 25 mg/dL   Creat 1.06 (H) 0.60 - 0.88 mg/dL   Total Bilirubin 0.4 0.2 - 1.2 mg/dL   Alkaline Phosphatase 38 33 - 130 U/L   AST 12 10 - 35 U/L   ALT 21 6 - 29 U/L   Total Protein 6.6 6.1 - 8.1 g/dL   Albumin 4.1 3.6 - 5.1 g/dL   Calcium 9.7 8.6 - 10.4 mg/dL   GFR, Est African American 56 (L) >=60 mL/min   GFR, Est Non African American 48 (L) >=60 mL/min  Lipid panel  Result Value Ref Range   Cholesterol 218 (H) <200 mg/dL   Triglycerides 137 <150 mg/dL   HDL 76 >50 mg/dL   Total CHOL/HDL Ratio 2.9 <5.0 Ratio   VLDL 27 <30 mg/dL   LDL Cholesterol 115 (H) <100 mg/dL  Hemoglobin A1c  Result Value Ref Range   Hgb A1c MFr Bld 6.6 (H) <5.7 %   Mean Plasma Glucose 143 mg/dL  Microalbumin / creatinine urine ratio  Result Value Ref Range   Creatinine, Urine 174 20 - 320 mg/dL   Microalb, Ur 1.9 Not estab mg/dL   Microalb Creat Ratio 11 <30 mcg/mg creat  Assessment & Plan:   Problem List Items Addressed This Visit      Endocrine   DM (diabetes mellitus) type II controlled, neurological manifestation ([place])    Foot exam by  MD today; mild diabetic neuropathy bilaterally in stocking distribution       Other Visit Diagnoses    Impacted cerumen of both ears    -  Primary   actually just the RIGHT ear; left ear clear; large clump of cerumen removed from RIGHT canal under direct visualization w/curette by MD   Relevant Orders   Ear Lavage       Follow up plan: No Follow-up on file.  An after-visit summary was printed and given to the patient at [facility].  Please see the patient instructions which may contain other information and recommendations beyond what is mentioned above in the assessment and plan.  No orders of the defined types were placed in this encounter.   Orders Placed This Encounter  Procedures  . Ear Lavage

## 2016-11-26 NOTE — Assessment & Plan Note (Signed)
Foot exam by MD today; mild diabetic neuropathy bilaterally in stocking distribution

## 2016-11-26 NOTE — Addendum Note (Signed)
Addended by: Docia Furl on: 11/26/2016 04:04 PM   Modules accepted: Orders

## 2016-11-27 DIAGNOSIS — M7061 Trochanteric bursitis, right hip: Secondary | ICD-10-CM | POA: Diagnosis not present

## 2016-12-06 ENCOUNTER — Ambulatory Visit: Payer: Medicare HMO | Attending: Family Medicine | Admitting: Physical Therapy

## 2016-12-06 DIAGNOSIS — G8929 Other chronic pain: Secondary | ICD-10-CM | POA: Diagnosis not present

## 2016-12-06 DIAGNOSIS — R296 Repeated falls: Secondary | ICD-10-CM | POA: Diagnosis not present

## 2016-12-06 DIAGNOSIS — M6281 Muscle weakness (generalized): Secondary | ICD-10-CM

## 2016-12-06 DIAGNOSIS — M533 Sacrococcygeal disorders, not elsewhere classified: Secondary | ICD-10-CM | POA: Insufficient documentation

## 2016-12-06 DIAGNOSIS — M545 Low back pain, unspecified: Secondary | ICD-10-CM

## 2016-12-06 DIAGNOSIS — R52 Pain, unspecified: Secondary | ICD-10-CM | POA: Insufficient documentation

## 2016-12-06 DIAGNOSIS — R2681 Unsteadiness on feet: Secondary | ICD-10-CM | POA: Insufficient documentation

## 2016-12-06 DIAGNOSIS — M791 Myalgia, unspecified site: Secondary | ICD-10-CM

## 2016-12-06 NOTE — Patient Instructions (Addendum)
Hands to shoulders, draw circles with elbows from inside to outside to set your shoulder blades on your back  With breathing   10 reps    ___________   An gentle twist to the L and R when sitting on rollator Inhale to lengthen tall,   exhale, twist stomach/chest to the R 3 breath   __________   Roll with knees first, and roll as a unit to avoid back pain

## 2016-12-06 NOTE — Therapy (Signed)
[place] MAIN [facility] SERVICES [address] [place], [place], [address] Phone: [phone]   Fax:  [phone]  Physical Therapy Treatment  Patient Details  Name: [name] MRN: [id number] Date of Birth: 06-11-1930 Referring Provider: Ernestine Conrad  Encounter Date: 12/06/2016      PT End of Session - 12/06/16 1820    Visit Number 2   Number of Visits 12   Date for PT Re-Evaluation 02/15/17   Authorization Type 2   PT Start Time 9767   PT Stop Time 1700   PT Time Calculation (min) 53 min   Activity Tolerance Patient tolerated treatment well;No increased pain   Behavior During Therapy WFL for tasks assessed/performed      Past Medical History:  Diagnosis Date  . Anxiety and depression   . Breast cancer ([place]) 2015   LT LUMPECTOMY  . Carpal tunnel syndrome   . Chest pain    SECONDARY TO GERD  . Chronic interstitial cystitis   . Chronic right hip pain   . Depression   . Diabetes mellitus without complication ([place])   . Diverticulosis   . Dyslipidemia   . Essential hypertension, benign   . Fibromyalgia   . Frequent falls   . GERD (gastroesophageal reflux disease)   . Hearing loss of left ear   . History of fractured rib   . History of TIA (transient ischemic attack)   . Irritable bowel   . Lumbar arthropathy ([place]) 10/04/2016   On CT scan, refer to ortho  . MS (multiple sclerosis) ([place])   . Osteoarthritis   . Paroxysmal supraventricular tachycardia ([place])   . Peripheral neuropathy    MILD  . Personal history of radiation therapy 2015   BREAST CA  . Protein malnutrition ([place])   . Pure hypercholesterolemia   . Recurrent UTI   . Skin cancer   . SOB (shortness of breath)    SECONDARY TO REFLUX  . Solitary pulmonary nodule on lung CT 12/04/2015   3 mm LLL lung nodule June 2016, March 2017  . Tachycardia, paroxysmal (HCC)    Reportedly Paroxysmal Supraventricular Tachycardia, but unable to find documentation confirming this  .  Thyroid nodule   . Vertigo   . Vitamin B 12 deficiency     Past Surgical History:  Procedure Laterality Date  . APPENDECTOMY    . AUGMENTATION MAMMAPLASTY Right 1975   BREAST BREAST ONLY  . BLADDER TACKING     X 3  . BREAST LUMPECTOMY    . BREAST SURGERY    . CARDIAC CATHETERIZATION  10/2001   Normal coronary arteries with the exception of 20% proximal D1  . CHOLECYSTECTOMY    . COLONOSCOPY WITH PROPOFOL N/A 01/21/2015   Procedure: COLONOSCOPY WITH PROPOFOL;  Surgeon: Josefine Class, MD;  Location: [facility] ENDOSCOPY;  Service: Endoscopy;  Laterality: N/A;  . ESOPHAGOGASTRODUODENOSCOPY N/A 01/21/2015   Procedure: ESOPHAGOGASTRODUODENOSCOPY (EGD);  Surgeon: Josefine Class, MD;  Location: [facility] ENDOSCOPY;  Service: Endoscopy;  Laterality: N/A;  . EYE SURGERIES     WITH BUCKLE DETACHMENT OF THE RETINA  . heart monitor     Not being used... Patient can not have a MRI  . HEMORRHOID SURGERY     WITH RECONSTRUCTION  . MASTECTOMY Right 1970   SUBCUTANEOUS - NO BREAST CANCER  . TONSILLECTOMY    . TOTAL ABDOMINAL HYSTERECTOMY W/ BILATERAL SALPINGOOPHORECTOMY    . TYMPANOPLASTY      There were no vitals filed for this  visit.      Subjective Assessment - 12/06/16 1612    Subjective Pt reported feeling three/ quarters better after last session. Pt went to get a shot two days ago in her back and she felt sore up until today. The shot did not seem to help her until after a whole week.  Rectal pain has improved since the last visit.  Sometimes she hget radiating pain.     Pertinent History Arthritis, neuropathy, MS, Breast CA, lumbar surgery    Patient Stated Goals not hurt when she steps on her leg            [facility] PT Assessment - 12/06/16 1615      Observation/Other Assessments   Observations wincing/yelling pain with bed mobility ( less pain with log rolling technique)      Palpation   SI assessment  SIJ symmetry. increased tenderness /tensions along thoracic paraspinals  T3-L3 on L > R, limited scapular mobility , upper trap overuse                      [facility] Adult PT Treatment/Exercise - 12/06/16 1615      Ambulation/Gait   Gait velocity 0.62 m/s with rollator    Gait Comments no foot drag, foot clearance normal, stride normal .     Therapeutic Activites    Therapeutic Activities --  see pt instructions     Neuro Re-ed    Neuro Re-ed Details  excessive cues tactle/ verbal for scapular control for shoulder depression in supine, sidelying, and gait with use of rollator     Moist Heat Therapy   Number Minutes Moist Heat 5 Minutes   Moist Heat Location --  back      Manual Therapy   Manual therapy comments STM, Grade II mobs along L T3-L3 along SP, fascial releases, less pressure based on pt's tolerance                      PT Long Term Goals - 11/26/16 0945      PT LONG TERM GOAL #1   Title Pt will demo no pelvic obliquities across 2 visits in order to walk without pain   Time 12   Period Weeks   Status New     PT LONG TERM GOAL #2   Title Pt will increase her gait speed from 0.48 m/s to > 1.0 m/s with rollator in order to minimal fall risk   Time 12   Period Weeks   Status New     PT LONG TERM GOAL #3   Title Pt will decrease her ODI score from  % to <   % in order to return to ADLs   Time 12   Period Weeks     PT LONG TERM GOAL #4   Title Pt will demo increased hip abduction strength from 3-/5 B to 4/5 in order to progress to dynamic stability training to ambulate int he community safely    Time 12   Period Weeks   Status New     PT LONG TERM GOAL #5   Title Pt will report no rectal pain when stepping on her foot with walking, bowel movements in order to improve QOL   Time 12   Period Weeks   Status New               Plan - 12/06/16 1821    Clinical Impression Statement Pt's rectal pain  has improved with last session and recent shot but will require Tx for spinal deficits to make long term  improvements. Pt showed more symmetrically aligned SIJ and increased gait speed today but showed upper trap overuse when pushing the rollator. This is likely associated with the increased mm tensions along L parspinals/ scapular regions. Following manual Tx , pt's mm tensions decreased and she demo'd ability to depression shoulders and retract scapula. Anticipate pt will continue to make further progress with skilled PT in order to address her LBP and other deficits.     Rehab Potential Good   Clinical Impairments Affecting Rehab Potential Hx of falls, hysterectomy, arthritis, neuropathy, MS, Breast CA, lumbar surgery, uses a rollator    PT Frequency 1x / week   PT Treatment/Interventions ADLs/Self Care Home Management;Aquatic Therapy;Electrical Stimulation;Neuromuscular re-education;Moist Heat;Balance training;Therapeutic exercise;Therapeutic activities;Functional mobility training;Stair training;Gait training;Patient/family education;Manual techniques;Manual lymph drainage;Scar mobilization;Taping   Consulted and Agree with Plan of Care Patient      Patient will benefit from skilled therapeutic intervention in order to improve the following deficits and impairments:  Abnormal gait, Pain, Increased muscle spasms, Decreased range of motion, Decreased safety awareness, Postural dysfunction, Improper body mechanics, Decreased strength, Decreased activity tolerance, Decreased balance, Decreased mobility, Decreased scar mobility, Decreased endurance, Impaired sensation, Decreased skin integrity, Decreased coordination, Difficulty walking, Increased fascial restricitons, Hypomobility  Visit Diagnosis: Sacrococcygeal disorders, not elsewhere classified  Muscle weakness (generalized)  Chronic bilateral low back pain without sciatica  Falls frequently  Myalgia  Unsteadiness on feet  Pain aggravated by walking  Bilateral low back pain without sciatica, unspecified chronicity     Problem  List Patient Active Problem List   Diagnosis Date Noted  . Lumbar arthropathy ([name]) 10/04/2016  . Vertigo, aural, left 07/31/2016  . Rectal pain 06/25/2016  . External hemorrhoid 06/18/2016  . Head injury due to trauma 05/09/2016  . Neoplasm of uncertain behavior of skin 05/09/2016  . Hemorrhoids 04/24/2016  . Dementia 04/24/2016  . Medication monitoring encounter 04/03/2016  . Confusion 04/03/2016  . Gait abnormality 04/03/2016  . Skin lump of leg 04/03/2016  . Pain in right knee 02/15/2016  . Pain and swelling of right knee 02/13/2016  . Right knee sprain 02/13/2016  . Solitary pulmonary nodule on lung CT 12/04/2015  . Generalized degenerative joint disease of hand 11/21/2015  . Triggering of digit 11/21/2015  . Snapping thumb syndrome 11/21/2015  . Pain, joint, hand 11/11/2015  . Coronary artery calcification seen on CAT scan 11/11/2015  . Multiple sclerosis ([place]) 11/11/2015  . Chronic pain of multiple joints 09/02/2015  . Right lower quadrant abdominal pain 08/02/2015  . Microscopic hematuria 07/12/2015  . Recurrent UTI 04/09/2015  . Carpal tunnel syndrome 01/12/2014  . DD (diverticular disease) 01/12/2014  . Ductal carcinoma in situ of left breast 01/12/2014  . Hypercholesteremia 01/12/2014  . DS (disseminated sclerosis) ([place]) 01/12/2014  . Arthritis, degenerative 01/12/2014  . Thyroid nodule 01/12/2014  . Cyanocobalamine deficiency (non anemic) 01/12/2014  . Anxiety and depression 01/12/2014  . Intraductal carcinoma of breast 01/12/2014  . History of right mastectomy 01/12/2014  . DM (diabetes mellitus) type II controlled, neurological manifestation ([place]) 06/26/2010  . Depression, major, recurrent, in partial remission ([place]) 06/26/2010  . Frequent falls 06/26/2010  . Stricture and stenosis of esophagus 06/26/2010  . Gastro-esophageal reflux disease without esophagitis 06/26/2010  . Fibromyalgia 06/26/2010  . Chronic interstitial cystitis 06/26/2010  . H/O  malignant neoplasm of breast 06/26/2010  . Hypertension goal BP (blood pressure) < 140/90 06/01/2009  .  H/O paroxysmal supraventricular tachycardia 06/01/2009  . Paroxysmal supraventricular tachycardia ([place]) 06/01/2009  . Irritable bowel syndrome with constipation and diarrhea 06/16/2008  . Diarrhea, functional 06/16/2008  . Diarrhea, unspecified 06/16/2008    Jerl Mina ,PT, DPT, E-RYT  12/06/2016, 6:25 PM  [place] MAIN [facility] SERVICES [address] [place], [place], [address] Phone: [phone]   Fax:  [phone]  Name: [name] MRN: [id number] Date of Birth: 10-Oct-1929

## 2016-12-07 ENCOUNTER — Encounter: Payer: Commercial Managed Care - HMO | Admitting: Physical Therapy

## 2016-12-13 ENCOUNTER — Ambulatory Visit: Payer: Medicare HMO | Admitting: Physical Therapy

## 2016-12-13 DIAGNOSIS — M533 Sacrococcygeal disorders, not elsewhere classified: Secondary | ICD-10-CM

## 2016-12-13 DIAGNOSIS — M791 Myalgia: Secondary | ICD-10-CM | POA: Diagnosis not present

## 2016-12-13 DIAGNOSIS — M545 Low back pain: Secondary | ICD-10-CM | POA: Diagnosis not present

## 2016-12-13 DIAGNOSIS — R296 Repeated falls: Secondary | ICD-10-CM | POA: Diagnosis not present

## 2016-12-13 DIAGNOSIS — G8929 Other chronic pain: Secondary | ICD-10-CM | POA: Diagnosis not present

## 2016-12-13 DIAGNOSIS — R2681 Unsteadiness on feet: Secondary | ICD-10-CM | POA: Diagnosis not present

## 2016-12-13 DIAGNOSIS — M6281 Muscle weakness (generalized): Secondary | ICD-10-CM

## 2016-12-13 DIAGNOSIS — R52 Pain, unspecified: Secondary | ICD-10-CM | POA: Diagnosis not present

## 2016-12-13 NOTE — Patient Instructions (Addendum)
Open book (handout)        Log rolling out of .bed   R arm overhead  Raise hips and scoot hips to L   Drop knees to R,  scooting R shoulder back to get completely on your R side so your shoulders, hips, and knees point to the R    Then breathe as you drop feet off bed and prop onto R elbow and  use both hands to push yourself

## 2016-12-13 NOTE — Therapy (Signed)
[place] MAIN [facility] SERVICES [address] [place], [place], [address] Phone: [phone]   Fax:  [phone]  Physical Therapy Treatment  Patient Details  Name: [name] MRN: [id number] Date of Birth: 08/02/1930 Referring Provider: Ernestine Conrad  Encounter Date: 12/13/2016      PT End of Session - 12/13/16 1553    Visit Number 3   Number of Visits 12   Date for PT Re-Evaluation 02/15/17   Authorization Type 3 g codes   PT Start Time 1500   PT Stop Time 1600   PT Time Calculation (min) 60 min   Activity Tolerance Patient tolerated treatment well;No increased pain   Behavior During Therapy WFL for tasks assessed/performed      Past Medical History:  Diagnosis Date  . Anxiety and depression   . Breast cancer ([place]) 2015   LT LUMPECTOMY  . Carpal tunnel syndrome   . Chest pain    SECONDARY TO GERD  . Chronic interstitial cystitis   . Chronic right hip pain   . Depression   . Diabetes mellitus without complication ([place])   . Diverticulosis   . Dyslipidemia   . Essential hypertension, benign   . Fibromyalgia   . Frequent falls   . GERD (gastroesophageal reflux disease)   . Hearing loss of left ear   . History of fractured rib   . History of TIA (transient ischemic attack)   . Irritable bowel   . Lumbar arthropathy ([place]) 10/04/2016   On CT scan, refer to ortho  . MS (multiple sclerosis) ([place])   . Osteoarthritis   . Paroxysmal supraventricular tachycardia ([place])   . Peripheral neuropathy    MILD  . Personal history of radiation therapy 2015   BREAST CA  . Protein malnutrition ([place])   . Pure hypercholesterolemia   . Recurrent UTI   . Skin cancer   . SOB (shortness of breath)    SECONDARY TO REFLUX  . Solitary pulmonary nodule on lung CT 12/04/2015   3 mm LLL lung nodule June 2016, March 2017  . Tachycardia, paroxysmal (HCC)    Reportedly Paroxysmal Supraventricular Tachycardia, but unable to find documentation confirming  this  . Thyroid nodule   . Vertigo   . Vitamin B 12 deficiency     Past Surgical History:  Procedure Laterality Date  . APPENDECTOMY    . AUGMENTATION MAMMAPLASTY Right 1975   BREAST BREAST ONLY  . BLADDER TACKING     X 3  . BREAST LUMPECTOMY    . BREAST SURGERY    . CARDIAC CATHETERIZATION  10/2001   Normal coronary arteries with the exception of 20% proximal D1  . CHOLECYSTECTOMY    . COLONOSCOPY WITH PROPOFOL N/A 01/21/2015   Procedure: COLONOSCOPY WITH PROPOFOL;  Surgeon: Josefine Class, MD;  Location: [facility] ENDOSCOPY;  Service: Endoscopy;  Laterality: N/A;  . ESOPHAGOGASTRODUODENOSCOPY N/A 01/21/2015   Procedure: ESOPHAGOGASTRODUODENOSCOPY (EGD);  Surgeon: Josefine Class, MD;  Location: [facility] ENDOSCOPY;  Service: Endoscopy;  Laterality: N/A;  . EYE SURGERIES     WITH BUCKLE DETACHMENT OF THE RETINA  . heart monitor     Not being used... Patient can not have a MRI  . HEMORRHOID SURGERY     WITH RECONSTRUCTION  . MASTECTOMY Right 1970   SUBCUTANEOUS - NO BREAST CANCER  . TONSILLECTOMY    . TOTAL ABDOMINAL HYSTERECTOMY W/ BILATERAL SALPINGOOPHORECTOMY    . TYMPANOPLASTY      There were no vitals filed  for this visit.      Subjective Assessment - 12/13/16 1549    Subjective Pt reported after last session, she woke up feeling "freer" and "feeling different". Pt perfermed her shoulder loosening exercises in the showering this morning.    Pertinent History Arthritis, neuropathy, MS, Breast CA, lumbar surgery    Patient Stated Goals not hurt when she steps on her leg            [facility] PT Assessment - 12/13/16 1552      Observation/Other Assessments   Observations improved spinal mobility with walking.  minor cues for lower trap activation when pushing rollator   Skin Integrity       Palpation   SI assessment  SIJ symmtery, decreased paraspinal mm tensions on L, increased mobility of thoracic segments, Increased tensions along R paraspinal scapular attachments/  midtrap/rhomboids      Bed Mobility   Bed Mobility --  less wincing and pain. cued for log rolling . practiced 2 x                      [facility] Adult PT Treatment/Exercise - 12/13/16 1550      Therapeutic Activites    Therapeutic Activities --  see pt instructions     Neuro Re-ed    Neuro Re-ed Details  tactile and verbal cues for scaplar depression B     Moist Heat Therapy   Number Minutes Moist Heat 8 Minutes   Moist Heat Location --  back      Manual Therapy   Manual therapy comments STM, Grade II mobs along R T3-L3 along SP, fascial releases, less pressure based on pt's tolerance   upper trap tensions B releases with MET                PT Education - 12/13/16 1553    Education provided Yes   Education Details HEP   Person(s) Educated Patient   Methods Demonstration;Explanation;Tactile cues;Handout;Verbal cues   Comprehension Verbalized understanding;Returned demonstration             PT Long Term Goals - 11/26/16 0945      PT LONG TERM GOAL #1   Title Pt will demo no pelvic obliquities across 2 visits in order to walk without pain   Time 12   Period Weeks   Status New     PT LONG TERM GOAL #2   Title Pt will increase her gait speed from 0.48 m/s to > 1.0 m/s with rollator in order to minimal fall risk   Time 12   Period Weeks   Status New     PT LONG TERM GOAL #3   Title Pt will decrease her ODI score from  % to <   % in order to return to ADLs   Time 12   Period Weeks     PT LONG TERM GOAL #4   Title Pt will demo increased hip abduction strength from 3-/5 B to 4/5 in order to progress to dynamic stability training to ambulate int he community safely    Time 12   Period Weeks   Status New     PT LONG TERM GOAL #5   Title Pt will report no rectal pain when stepping on her foot with walking, bowel movements in order to improve QOL   Time 12   Period Weeks   Status New  Plan - 12/13/16 1554    Clinical  Impression Statement Pt continues to show improved spinal mobility and pelvic symmetry. Pt required more manual Tx to release spinal mm tensions to improve thoracic mobility. Addressed gait with rollator and log rolling technique to minimize straining the spine. Pt's rollator was adjusted lower to minimize upper trap mm elevation and anticipate this adjustment will play an important role in pt's prognosis.  Pt demo'd correctly with practice.  Pt continues to tolerate manual Tx without complaints and is progressing towards her goals with skilled PT.    Rehab Potential Good   Clinical Impairments Affecting Rehab Potential Hx of falls, hysterectomy, arthritis, neuropathy, MS, Breast CA, lumbar surgery, uses a rollator    PT Frequency 1x / week   PT Treatment/Interventions ADLs/Self Care Home Management;Aquatic Therapy;Electrical Stimulation;Neuromuscular re-education;Moist Heat;Balance training;Therapeutic exercise;Therapeutic activities;Functional mobility training;Stair training;Gait training;Patient/family education;Manual techniques;Manual lymph drainage;Scar mobilization;Taping   Consulted and Agree with Plan of Care Patient      Patient will benefit from skilled therapeutic intervention in order to improve the following deficits and impairments:  Abnormal gait, Pain, Increased muscle spasms, Decreased range of motion, Decreased safety awareness, Postural dysfunction, Improper body mechanics, Decreased strength, Decreased activity tolerance, Decreased balance, Decreased mobility, Decreased scar mobility, Decreased endurance, Impaired sensation, Decreased skin integrity, Decreased coordination, Difficulty walking, Increased fascial restricitons, Hypomobility  Visit Diagnosis: Muscle weakness (generalized)  Chronic bilateral low back pain without sciatica  Sacrococcygeal disorders, not elsewhere classified  Falls frequently     Problem List Patient Active Problem List   Diagnosis Date Noted   . Lumbar arthropathy ([place]) 10/04/2016  . Vertigo, aural, left 07/31/2016  . Rectal pain 06/25/2016  . External hemorrhoid 06/18/2016  . Head injury due to trauma 05/09/2016  . Neoplasm of uncertain behavior of skin 05/09/2016  . Hemorrhoids 04/24/2016  . Dementia 04/24/2016  . Medication monitoring encounter 04/03/2016  . Confusion 04/03/2016  . Gait abnormality 04/03/2016  . Skin lump of leg 04/03/2016  . Pain in right knee 02/15/2016  . Pain and swelling of right knee 02/13/2016  . Right knee sprain 02/13/2016  . Solitary pulmonary nodule on lung CT 12/04/2015  . Generalized degenerative joint disease of hand 11/21/2015  . Triggering of digit 11/21/2015  . Snapping thumb syndrome 11/21/2015  . Pain, joint, hand 11/11/2015  . Coronary artery calcification seen on CAT scan 11/11/2015  . Multiple sclerosis ([place]) 11/11/2015  . Chronic pain of multiple joints 09/02/2015  . Right lower quadrant abdominal pain 08/02/2015  . Microscopic hematuria 07/12/2015  . Recurrent UTI 04/09/2015  . Carpal tunnel syndrome 01/12/2014  . DD (diverticular disease) 01/12/2014  . Ductal carcinoma in situ of left breast 01/12/2014  . Hypercholesteremia 01/12/2014  . DS (disseminated sclerosis) ([place]) 01/12/2014  . Arthritis, degenerative 01/12/2014  . Thyroid nodule 01/12/2014  . Cyanocobalamine deficiency (non anemic) 01/12/2014  . Anxiety and depression 01/12/2014  . Intraductal carcinoma of breast 01/12/2014  . History of right mastectomy 01/12/2014  . DM (diabetes mellitus) type II controlled, neurological manifestation ([place]) 06/26/2010  . Depression, major, recurrent, in partial remission ([place]) 06/26/2010  . Frequent falls 06/26/2010  . Stricture and stenosis of esophagus 06/26/2010  . Gastro-esophageal reflux disease without esophagitis 06/26/2010  . Fibromyalgia 06/26/2010  . Chronic interstitial cystitis 06/26/2010  . H/O malignant neoplasm of breast 06/26/2010  . Hypertension goal BP  (blood pressure) < 140/90 06/01/2009  . H/O paroxysmal supraventricular tachycardia 06/01/2009  . Paroxysmal supraventricular tachycardia ([place]) 06/01/2009  . Irritable bowel syndrome  with constipation and diarrhea 06/16/2008  . Diarrhea, functional 06/16/2008  . Diarrhea, unspecified 06/16/2008    Jerl Mina ,PT, DPT, E-RYT  12/13/2016, 3:58 PM  [place] MAIN [facility] SERVICES [address] [place], [place], [address] Phone: [phone]   Fax:  [phone]  Name: [name] MRN: [id number] Date of Birth: 20-Apr-1930

## 2016-12-17 ENCOUNTER — Encounter: Payer: Commercial Managed Care - HMO | Admitting: Physical Therapy

## 2016-12-20 ENCOUNTER — Ambulatory Visit: Payer: Medicare HMO | Admitting: Physical Therapy

## 2016-12-26 ENCOUNTER — Ambulatory Visit: Payer: Medicare HMO | Admitting: Physical Therapy

## 2016-12-26 DIAGNOSIS — M545 Low back pain, unspecified: Secondary | ICD-10-CM

## 2016-12-26 DIAGNOSIS — R2681 Unsteadiness on feet: Secondary | ICD-10-CM | POA: Diagnosis not present

## 2016-12-26 DIAGNOSIS — M533 Sacrococcygeal disorders, not elsewhere classified: Secondary | ICD-10-CM | POA: Diagnosis not present

## 2016-12-26 DIAGNOSIS — M791 Myalgia, unspecified site: Secondary | ICD-10-CM

## 2016-12-26 DIAGNOSIS — R52 Pain, unspecified: Secondary | ICD-10-CM

## 2016-12-26 DIAGNOSIS — R296 Repeated falls: Secondary | ICD-10-CM | POA: Diagnosis not present

## 2016-12-26 DIAGNOSIS — M6281 Muscle weakness (generalized): Secondary | ICD-10-CM | POA: Diagnosis not present

## 2016-12-26 DIAGNOSIS — G8929 Other chronic pain: Secondary | ICD-10-CM

## 2016-12-26 NOTE — Patient Instructions (Addendum)
To loosen the back of the hips  Seated:  Turn body 45deg to the Left, left foot under knee.  R knee in and out 10 reps with the foot   Switch to other side    _____________  (illustrations hand drawn)  Standing facing away from the seat, hands on the lower handle  Marching with thigh high , place foot under hips as you march  30 reps . Breathing          Standing inside rollator facing the seat:  Feet hip width apart: R foot out to the side, back to the middle under the hip, move foot behind, back to the middle under hip, move foot to the side  10 reps      ____________  Sit to stand 5 x/ 5 day during TV commericials  Knees behind toes

## 2016-12-26 NOTE — Therapy (Signed)
[place] MAIN [facility] SERVICES [address] [place], [place], [address] Phone: [phone]   Fax:  [phone]  Physical Therapy Treatment  Patient Details  Name: [name] MRN: [id number] Date of Birth: 1930/03/01 Referring Provider: Ernestine Conrad  Encounter Date: 12/26/2016      PT End of Session - 12/26/16 0858    Visit Number 4   Number of Visits 12   Date for PT Re-Evaluation 02/15/17   Authorization Type 4 g codes   PT Start Time 0810   PT Stop Time 0900   PT Time Calculation (min) 50 min   Activity Tolerance Patient tolerated treatment well;No increased pain   Behavior During Therapy WFL for tasks assessed/performed      Past Medical History:  Diagnosis Date  . Anxiety and depression   . Breast cancer ([place]) 2015   LT LUMPECTOMY  . Carpal tunnel syndrome   . Chest pain    SECONDARY TO GERD  . Chronic interstitial cystitis   . Chronic right hip pain   . Depression   . Diabetes mellitus without complication ([place])   . Diverticulosis   . Dyslipidemia   . Essential hypertension, benign   . Fibromyalgia   . Frequent falls   . GERD (gastroesophageal reflux disease)   . Hearing loss of left ear   . History of fractured rib   . History of TIA (transient ischemic attack)   . Irritable bowel   . Lumbar arthropathy ([place]) 10/04/2016   On CT scan, refer to ortho  . MS (multiple sclerosis) ([place])   . Osteoarthritis   . Paroxysmal supraventricular tachycardia ([place])   . Peripheral neuropathy    MILD  . Personal history of radiation therapy 2015   BREAST CA  . Protein malnutrition ([place])   . Pure hypercholesterolemia   . Recurrent UTI   . Skin cancer   . SOB (shortness of breath)    SECONDARY TO REFLUX  . Solitary pulmonary nodule on lung CT 12/04/2015   3 mm LLL lung nodule June 2016, March 2017  . Tachycardia, paroxysmal (HCC)    Reportedly Paroxysmal Supraventricular Tachycardia, but unable to find documentation confirming  this  . Thyroid nodule   . Vertigo   . Vitamin B 12 deficiency     Past Surgical History:  Procedure Laterality Date  . APPENDECTOMY    . AUGMENTATION MAMMAPLASTY Right 1975   BREAST BREAST ONLY  . BLADDER TACKING     X 3  . BREAST LUMPECTOMY    . BREAST SURGERY    . CARDIAC CATHETERIZATION  10/2001   Normal coronary arteries with the exception of 20% proximal D1  . CHOLECYSTECTOMY    . COLONOSCOPY WITH PROPOFOL N/A 01/21/2015   Procedure: COLONOSCOPY WITH PROPOFOL;  Surgeon: Josefine Class, MD;  Location[facility] ENDOSCOPY;  Service: Endoscopy;  Laterality: N/A;  . ESOPHAGOGASTRODUODENOSCOPY N/A 01/21/2015   Procedure: ESOPHAGOGASTRODUODENOSCOPY (EGD);  Surgeon: Josefine Class, MD;  Location: [facility] ENDOSCOPY;  Service: Endoscopy;  Laterality: N/A;  . EYE SURGERIES     WITH BUCKLE DETACHMENT OF THE RETINA  . heart monitor     Not being used... Patient can not have a MRI  . HEMORRHOID SURGERY     WITH RECONSTRUCTION  . MASTECTOMY Right 1970   SUBCUTANEOUS - NO BREAST CANCER  . TONSILLECTOMY    . TOTAL ABDOMINAL HYSTERECTOMY W/ BILATERAL SALPINGOOPHORECTOMY    . TYMPANOPLASTY      There were no vitals filed  for this visit.      Subjective Assessment - 12/26/16 0845    Subjective Pt reported she has not had the rectal pain for the last 3 weeks. She feels her LBP has gotten better    Pertinent History Arthritis, neuropathy, MS, Breast CA, lumbar surgery    Patient Stated Goals not hurt when she steps on her leg            [facility] PT Assessment - 12/26/16 0849      Palpation   Spinal mobility decreased tenderness along paraspinals B   SI assessment  limited mobility at L lateral border of sacrum  , tightness and tenderness along superior iliac crest                      [facility] Adult PT Treatment/Exercise - 12/26/16 0848      Therapeutic Activites    Therapeutic Activities --  pt instructions      Manual Therapy   Manual therapy comments  Sidelying, inferior mob, STM at L PSIS, long distraction at thigh, MWM hip abd/ER with PA mob along lateral border of sacrum                 PT Education - 12/26/16 0903    Education provided Yes   Education Details HEP   Person(s) Educated Patient   Methods Explanation;Demonstration;Tactile cues;Verbal cues;Handout   Comprehension Verbalized understanding;Returned demonstration             PT Long Term Goals - 11/26/16 0945      PT LONG TERM GOAL #1   Title Pt will demo no pelvic obliquities across 2 visits in order to walk without pain   Time 12   Period Weeks   Status New     PT LONG TERM GOAL #2   Title Pt will increase her gait speed from 0.48 m/s to > 1.0 m/s with rollator in order to minimal fall risk   Time 12   Period Weeks   Status New     PT LONG TERM GOAL #3   Title Pt will decrease her ODI score from  % to <   % in order to return to ADLs   Time 12   Period Weeks     PT LONG TERM GOAL #4   Title Pt will demo increased hip abduction strength from 3-/5 B to 4/5 in order to progress to dynamic stability training to ambulate int he community safely    Time 12   Period Weeks   Status New     PT LONG TERM GOAL #5   Title Pt will report no rectal pain when stepping on her foot with walking, bowel movements in order to improve QOL   Time 12   Period Weeks   Status New               Plan - 12/26/16 4270    Clinical Impression Statement Pt has not had rectal pain for the past 3 weeks. Pt continues to show increased mobility in her sacroiliac joint and spine with compliance to HEP. Her gait pattern has improved. Addressed minor limited mobility in L lateral border of SIJ with manual Tx and therapeutic activity.  Pt reported feeling looser in her hips and had less tenderness with palpation to iliac rest and lateral border of SIJ.  Pt is requiring moderate cues for upper trap mm depression with use of the rollator.  Pt continues to benefit  from  skilled PT.    Rehab Potential Good   Clinical Impairments Affecting Rehab Potential Hx of falls, hysterectomy, arthritis, neuropathy, MS, Breast CA, lumbar surgery, uses a rollator    PT Frequency 1x / week   PT Treatment/Interventions ADLs/Self Care Home Management;Aquatic Therapy;Electrical Stimulation;Neuromuscular re-education;Moist Heat;Balance training;Therapeutic exercise;Therapeutic activities;Functional mobility training;Stair training;Gait training;Patient/family education;Manual techniques;Manual lymph drainage;Scar mobilization;Taping   Consulted and Agree with Plan of Care Patient      Patient will benefit from skilled therapeutic intervention in order to improve the following deficits and impairments:  Abnormal gait, Pain, Increased muscle spasms, Decreased range of motion, Decreased safety awareness, Postural dysfunction, Improper body mechanics, Decreased strength, Decreased activity tolerance, Decreased balance, Decreased mobility, Decreased scar mobility, Decreased endurance, Impaired sensation, Decreased skin integrity, Decreased coordination, Difficulty walking, Increased fascial restricitons, Hypomobility  Visit Diagnosis: Muscle weakness (generalized)  Sacrococcygeal disorders, not elsewhere classified  Falls frequently  Chronic bilateral low back pain without sciatica  Myalgia  Unsteadiness on feet  Pain aggravated by walking  Bilateral low back pain without sciatica, unspecified chronicity     Problem List Patient Active Problem List   Diagnosis Date Noted  . Lumbar arthropathy ([place]) 10/04/2016  . Vertigo, aural, left 07/31/2016  . Rectal pain 06/25/2016  . External hemorrhoid 06/18/2016  . Head injury due to trauma 05/09/2016  . Neoplasm of uncertain behavior of skin 05/09/2016  . Hemorrhoids 04/24/2016  . Dementia 04/24/2016  . Medication monitoring encounter 04/03/2016  . Confusion 04/03/2016  . Gait abnormality 04/03/2016  . Skin lump of leg  04/03/2016  . Pain in right knee 02/15/2016  . Pain and swelling of right knee 02/13/2016  . Right knee sprain 02/13/2016  . Solitary pulmonary nodule on lung CT 12/04/2015  . Generalized degenerative joint disease of hand 11/21/2015  . Triggering of digit 11/21/2015  . Snapping thumb syndrome 11/21/2015  . Pain, joint, hand 11/11/2015  . Coronary artery calcification seen on CAT scan 11/11/2015  . Multiple sclerosis ([place]) 11/11/2015  . Chronic pain of multiple joints 09/02/2015  . Right lower quadrant abdominal pain 08/02/2015  . Microscopic hematuria 07/12/2015  . Recurrent UTI 04/09/2015  . Carpal tunnel syndrome 01/12/2014  . DD (diverticular disease) 01/12/2014  . Ductal carcinoma in situ of left breast 01/12/2014  . Hypercholesteremia 01/12/2014  . DS (disseminated sclerosis) ([place]) 01/12/2014  . Arthritis, degenerative 01/12/2014  . Thyroid nodule 01/12/2014  . Cyanocobalamine deficiency (non anemic) 01/12/2014  . Anxiety and depression 01/12/2014  . Intraductal carcinoma of breast 01/12/2014  . History of right mastectomy 01/12/2014  . DM (diabetes mellitus) type II controlled, neurological manifestation ([place]) 06/26/2010  . Depression, major, recurrent, in partial remission ([place]) 06/26/2010  . Frequent falls 06/26/2010  . Stricture and stenosis of esophagus 06/26/2010  . Gastro-esophageal reflux disease without esophagitis 06/26/2010  . Fibromyalgia 06/26/2010  . Chronic interstitial cystitis 06/26/2010  . H/O malignant neoplasm of breast 06/26/2010  . Hypertension goal BP (blood pressure) < 140/90 06/01/2009  . H/O paroxysmal supraventricular tachycardia 06/01/2009  . Paroxysmal supraventricular tachycardia ([place]) 06/01/2009  . Irritable bowel syndrome with constipation and diarrhea 06/16/2008  . Diarrhea, functional 06/16/2008  . Diarrhea, unspecified 06/16/2008    Jerl Mina ,PT, DPT, E-RYT  12/26/2016, 9:04 AM  [place]  MAIN [facility] SERVICES [address] [place], [place], [address] Phone: [phone]   Fax:  [phone]  Name: [name] MRN: [id number] Date of Birth: [birth year]

## 2016-12-28 ENCOUNTER — Encounter: Payer: Commercial Managed Care - HMO | Admitting: Physical Therapy

## 2017-01-03 ENCOUNTER — Other Ambulatory Visit: Payer: Self-pay | Admitting: Family Medicine

## 2017-01-03 NOTE — Telephone Encounter (Signed)
Request received for statin refill Too soon; she shouldn't be out until the 3rd week of June Also, she was due to have fasting cholesterol and SGPT rechecked 6-8 weeks after the dose increase of the statin Please ask her to come in; re-enter labs if expired (see lab result note from October 04, 2016 labs for any questions) Thank you

## 2017-01-04 ENCOUNTER — Ambulatory Visit (INDEPENDENT_AMBULATORY_CARE_PROVIDER_SITE_OTHER): Payer: Medicare HMO | Admitting: Family Medicine

## 2017-01-04 ENCOUNTER — Encounter: Payer: Self-pay | Admitting: Family Medicine

## 2017-01-04 DIAGNOSIS — I10 Essential (primary) hypertension: Secondary | ICD-10-CM | POA: Diagnosis not present

## 2017-01-04 DIAGNOSIS — R911 Solitary pulmonary nodule: Secondary | ICD-10-CM | POA: Diagnosis not present

## 2017-01-04 DIAGNOSIS — E1140 Type 2 diabetes mellitus with diabetic neuropathy, unspecified: Secondary | ICD-10-CM

## 2017-01-04 DIAGNOSIS — F0390 Unspecified dementia without behavioral disturbance: Secondary | ICD-10-CM

## 2017-01-04 DIAGNOSIS — N3010 Interstitial cystitis (chronic) without hematuria: Secondary | ICD-10-CM | POA: Diagnosis not present

## 2017-01-04 DIAGNOSIS — K591 Functional diarrhea: Secondary | ICD-10-CM

## 2017-01-04 DIAGNOSIS — G35 Multiple sclerosis: Secondary | ICD-10-CM

## 2017-01-04 DIAGNOSIS — E7800 Pure hypercholesterolemia, unspecified: Secondary | ICD-10-CM

## 2017-01-04 NOTE — Telephone Encounter (Signed)
Pt has a appt today. Pt will get labs done today.

## 2017-01-04 NOTE — Assessment & Plan Note (Signed)
Well controlled 

## 2017-01-04 NOTE — Progress Notes (Signed)
BP 112/64   Pulse 83   Temp 98.3 F (36.8 C) (Oral)   Resp 14   Wt 150 lb 9.6 oz (68.3 kg)   SpO2 97%   BMI 29.41 kg/m    Subjective:    Patient ID: [name], female    DOB: [birth year], 82 y.o.   MRN: [id number]  HPI: [name] is a 83 y.o. female  Chief Complaint  Patient presents with  . Follow-up    HPI Patient is here for follow-up  Type 2 diabetes mellitus; no high sugar symptoms; not checking sugars; gets thirsty but no dry mouth Some numbness in the feet; no sores or calluses; she decreased her metformin from twice a day to just once a day; she was not able to tell me why she reduced the dose; not sick to stomach Lab Results  Component Value Date   HGBA1C 6.6 (H) 10/04/2016   High cholesterol; here fasting today for recheck lipids; she brought in 20 mg bottle of atorvastatin; it was supposed to be increased to 40 mg but patient does not think she is taking any; the bottle she brought in is from December for 30 pills; she doesn't eat much sausage; she does eat bacon sometimes; does eat some cheese CMA checked with pharmacist; she did in fact pick up 90 day supply in early April of the 40 mg atorvastatin  She has back pain and goes for the injection on Tuesday at [facility]; seeing specialist for her back; had injection last visit about 6 weeks ago  She has miralax and imodium; she goes back and forth between constipation and loose stools  She has MS and dementia; seeing neurologist for that; seeing urologist for interstitial cystitis  Single nodule lung; rescanned March 20th; no further work-up  Depression screen [facility] 2/9 01/04/2017 11/26/2016 10/10/2016 10/04/2016 09/11/2016  Decreased Interest 0 0 0 0 0  Down, Depressed, Hopeless 1 0 0 0 0  PHQ - 2 Score 1 0 0 0 0  Altered sleeping - - - - -  Tired, decreased energy - - - - -  Change in appetite - - - - -  Feeling bad or failure about yourself  - - - - -  Trouble concentrating - - - - -  Moving slowly or  fidgety/restless - - - - -  Suicidal thoughts - - - - -  PHQ-9 Score - - - - -  Difficult doing work/chores - - - - -  Some recent data might be hidden    Relevant past medical, surgical, family and social history reviewed Past Medical History:  Diagnosis Date  . Anxiety and depression   . Breast cancer ([place]) 2015   LT LUMPECTOMY  . Carpal tunnel syndrome   . Chest pain    SECONDARY TO GERD  . Chronic interstitial cystitis   . Chronic right hip pain   . Depression   . Diabetes mellitus without complication ([place])   . Diverticulosis   . Dyslipidemia   . Essential hypertension, benign   . Fibromyalgia   . Frequent falls   . GERD (gastroesophageal reflux disease)   . Hearing loss of left ear   . History of fractured rib   . History of TIA (transient ischemic attack)   . Irritable bowel   . Lumbar arthropathy ([employer]) 10/04/2016   On CT scan, refer to ortho  . MS (multiple sclerosis) ([place])   . Osteoarthritis   . Paroxysmal supraventricular tachycardia ([place])   .  Peripheral neuropathy    MILD  . Personal history of radiation therapy 2015   BREAST CA  . Protein malnutrition ([place])   . Pure hypercholesterolemia   . Recurrent UTI   . Skin cancer   . SOB (shortness of breath)    SECONDARY TO REFLUX  . Solitary pulmonary nodule on lung CT 12/04/2015   3 mm LLL lung nodule June 2016, March 2017  . Tachycardia, paroxysmal (HCC)    Reportedly Paroxysmal Supraventricular Tachycardia, but unable to find documentation confirming this  . Thyroid nodule   . Vertigo   . Vitamin B 12 deficiency    Past Surgical History:  Procedure Laterality Date  . APPENDECTOMY    . AUGMENTATION MAMMAPLASTY Right 1975   BREAST BREAST ONLY  . BLADDER TACKING     X 3  . BREAST LUMPECTOMY    . BREAST SURGERY    . CARDIAC CATHETERIZATION  10/2001   Normal coronary arteries with the exception of 20% proximal D1  . CHOLECYSTECTOMY    . COLONOSCOPY WITH PROPOFOL N/A 01/21/2015   Procedure: COLONOSCOPY  WITH PROPOFOL;  Surgeon: Josefine Class, MD;  Location: [facility] ENDOSCOPY;  Service: Endoscopy;  Laterality: N/A;  . ESOPHAGOGASTRODUODENOSCOPY N/A 01/21/2015   Procedure: ESOPHAGOGASTRODUODENOSCOPY (EGD);  Surgeon: Josefine Class, MD;  Location[facility] ENDOSCOPY;  Service: Endoscopy;  Laterality: N/A;  . EYE SURGERIES     WITH BUCKLE DETACHMENT OF THE RETINA  . heart monitor     Not being used... Patient can not have a MRI  . HEMORRHOID SURGERY     WITH RECONSTRUCTION  . MASTECTOMY Right 1970   SUBCUTANEOUS - NO BREAST CANCER  . TONSILLECTOMY    . TOTAL ABDOMINAL HYSTERECTOMY W/ BILATERAL SALPINGOOPHORECTOMY    . TYMPANOPLASTY     Family History  Problem Relation Age of Onset  . ALS Mother   . Kidney disease Sister   . Arthritis Sister   . Aneurysm Brother   . Heart disease Daughter   . Colon cancer Unknown   . Breast cancer Neg Hx   . Bladder Cancer Neg Hx   . Prostate cancer Neg Hx    Social History   Social History  . Marital status: Married    Spouse name: Marcello Moores  . Number of children: 4  . Years of education: N/A   Occupational History  . 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 Smoking status: Never Smoker  . Smokeless tobacco: Never Used  . Alcohol use No  . Drug use: No  . Sexual activity: No   Other Topics Concern  . Not on file   Social History Narrative   1 SON, 3 DAUGHTER'S   15 GRANDCHILDREN   LIVING AT [facility]    Interim medical history since last visit reviewed. Allergies and medications reviewed  Review of Systems Per HPI unless specifically indicated above     Objective:    BP 112/64   Pulse 83   Temp 98.3 F (36.8 C) (Oral)   Resp 14   Wt 150 lb 9.6 oz (68.3 kg)   SpO2 97%   BMI 29.41 kg/m   Wt Readings from Last 3 Encounters:  01/04/17 150 lb 9.6 oz (68.3 kg)  11/26/16 154 lb 6.4 oz (70 kg)  11/05/16 153 lb (69.4 kg)    Physical Exam  Constitutional: She appears  well-developed and well-nourished.  HENT:  Mouth/Throat: Mucous membranes are normal.  Eyes: EOM are normal. No scleral icterus.  Neck: No JVD present.  Cardiovascular: Normal rate and regular rhythm.   Pulmonary/Chest: Effort normal and breath sounds normal.  Abdominal: She exhibits no distension.  Musculoskeletal: She exhibits no edema.  Skin: No pallor.  Psychiatric: She has a normal mood and affect. Her behavior is normal.   Diabetic Foot Form - Detailed   Diabetic Foot Exam - detailed Diabetic Foot exam was performed with the following findings:  Yes 01/04/2017  2:25 PM  Visual Foot Exam completed.:  Yes  Are the toenails ingrown?:  No Normal Range of Motion:  Yes Pulse Foot Exam completed.:  Yes  Right Dorsalis Pedis:  Present Left Dorsalis Pedis:  Present  Sensory Foot Exam Completed.:  Yes Swelling:  No Semmes-Weinstein Monofilament Test R Site 1-Great Toe:  Pos L Site 1-Great Toe:  Pos  R Site 4:  Pos L Site 4:  Pos  R Site 5:  Pos L Site 5:  Pos    Comments:  Sensation intact, but diminished in toes     Results for orders placed or performed in visit on 10/04/16  COMPLETE METABOLIC PANEL WITH GFR  Result Value Ref Range   Sodium 142 135 - 146 mmol/L   Potassium 4.5 3.5 - 5.3 mmol/L   Chloride 108 98 - 110 mmol/L   CO2 24 20 - 31 mmol/L   Glucose, Bld 96 65 - 99 mg/dL   BUN 14 7 - 25 mg/dL   Creat 1.06 (H) 0.60 - 0.88 mg/dL   Total Bilirubin 0.4 0.2 - 1.2 mg/dL   Alkaline Phosphatase 38 33 - 130 U/L   AST 12 10 - 35 U/L   ALT 21 6 - 29 U/L   Total Protein 6.6 6.1 - 8.1 g/dL   Albumin 4.1 3.6 - 5.1 g/dL   Calcium 9.7 8.6 - 10.4 mg/dL   GFR, Est African American 56 (L) >=60 mL/min   GFR, Est Non African American 48 (L) >=60 mL/min  Lipid panel  Result Value Ref Range   Cholesterol 218 (H) <200 mg/dL   Triglycerides 137 <150 mg/dL   HDL 76 >50 mg/dL   Total CHOL/HDL Ratio 2.9 <5.0 Ratio   VLDL 27 <30 mg/dL   LDL Cholesterol 115 (H) <100 mg/dL  Hemoglobin  A1c  Result Value Ref Range   Hgb A1c MFr Bld 6.6 (H) <5.7 %   Mean Plasma Glucose 143 mg/dL  Microalbumin / creatinine urine ratio  Result Value Ref Range   Creatinine, Urine 174 20 - 320 mg/dL   Microalb, Ur 1.9 Not estab mg/dL   Microalb Creat Ratio 11 <30 mcg/mg creat      Assessment & Plan:   Problem List Items Addressed This Visit      Cardiovascular and Mediastinum   Hypertension goal BP (blood pressure) < 140/90 (Chronic)    Well-controlled        Digestive   Diarrhea, functional    It sounds like patient is swinging herself from diarrhea to constipation to diarrhea and back with her medicines; I suggested she stop these meds and let her bowels try to regulate on their own with adequate fiber and hydration        Endocrine   DM (diabetes mellitus) type II controlled, neurological manifestation ([place])    Foot exam by MD today; will check A1c today since she reduced her own dose of metformin      Relevant Orders   Hemoglobin A1c  Nervous and Auditory   Multiple sclerosis [facility])    Seeing neurologist      Dementia    Seeing neurologist        Genitourinary   Chronic interstitial cystitis    Seeing urologist        Other   Solitary pulmonary nodule on lung CT    rescan of lung in March 2018 showed stability, no further work-up needed per radiologist      Hypercholesteremia    Encouraged patient to bring all of her medicines with her to her appointments; check lipids today; try to limit bacon and sausage and cheese          Follow up plan: Return in about 3 months (around 04/06/2017) for twenty minute follow-up with fasting labs.  An after-visit summary was printed and given to the patient at [place].  Please see the patient instructions which may contain other information and recommendations beyond what is mentioned above in the assessment and plan.  No orders of the defined types were placed in this encounter.   Orders Placed This Encounter   Procedures  . Hemoglobin A1c

## 2017-01-04 NOTE — Assessment & Plan Note (Signed)
It sounds like patient is swinging herself from diarrhea to constipation to diarrhea and back with her medicines; I suggested she stop these meds and let her bowels try to regulate on their own with adequate fiber and hydration

## 2017-01-04 NOTE — Assessment & Plan Note (Signed)
Foot exam by MD today; will check A1c today since she reduced her own dose of metformin

## 2017-01-04 NOTE — Assessment & Plan Note (Signed)
Seeing urologist.

## 2017-01-04 NOTE — Assessment & Plan Note (Signed)
Seeing neurologist.

## 2017-01-04 NOTE — Patient Instructions (Addendum)
I'll suggest stopping the miralax and the imodium, as you may be sending yourself from one extreme to the other; try to stay hydrated and get 25 grams of fiber a day and let your bowels regulate on their own Please bring all of your medicine bottles with you to all of your appointments so we can verify your medicines with our list  Try to limit saturated fats in your diet (bologna, hot dogs, barbeque, cheeseburgers, hamburgers, steak, bacon, sausage, cheese, etc.) and get more fresh fruits, vegetables, and whole grains  You don't need to take iron any more  We'll get your labs today If you have not heard anything from my staff in a week about any orders/referrals/studies from today, please contact us here to follow-up [phone]

## 2017-01-04 NOTE — Assessment & Plan Note (Signed)
rescan of lung in March 2018 showed stability, no further work-up needed per radiologist

## 2017-01-04 NOTE — Assessment & Plan Note (Signed)
Encouraged patient to bring all of her medicines with her to her appointments; check lipids today; try to limit bacon and sausage and cheese

## 2017-01-05 LAB — HEMOGLOBIN A1C
HEMOGLOBIN A1C: 7 % — AB (ref <5.7)
MEAN PLASMA GLUCOSE: 154 mg/dL

## 2017-01-07 ENCOUNTER — Ambulatory Visit: Payer: Medicare HMO | Attending: Urology | Admitting: Physical Therapy

## 2017-01-07 DIAGNOSIS — M791 Myalgia: Secondary | ICD-10-CM | POA: Insufficient documentation

## 2017-01-07 DIAGNOSIS — R296 Repeated falls: Secondary | ICD-10-CM | POA: Diagnosis not present

## 2017-01-07 DIAGNOSIS — G8929 Other chronic pain: Secondary | ICD-10-CM | POA: Insufficient documentation

## 2017-01-07 DIAGNOSIS — M545 Low back pain, unspecified: Secondary | ICD-10-CM

## 2017-01-07 DIAGNOSIS — R52 Pain, unspecified: Secondary | ICD-10-CM | POA: Insufficient documentation

## 2017-01-07 DIAGNOSIS — R2689 Other abnormalities of gait and mobility: Secondary | ICD-10-CM | POA: Diagnosis not present

## 2017-01-07 DIAGNOSIS — M6281 Muscle weakness (generalized): Secondary | ICD-10-CM | POA: Diagnosis not present

## 2017-01-07 DIAGNOSIS — M533 Sacrococcygeal disorders, not elsewhere classified: Secondary | ICD-10-CM | POA: Insufficient documentation

## 2017-01-07 DIAGNOSIS — R2681 Unsteadiness on feet: Secondary | ICD-10-CM | POA: Insufficient documentation

## 2017-01-07 NOTE — Patient Instructions (Signed)
180 deg turns with feet moving first then trunk   Same exercises as last session   Knee to chest (figure 4 ) with towel on R   10 reps

## 2017-01-07 NOTE — Therapy (Signed)
[place] MAIN [facility] SERVICES [address] [place], [place], [address] Phone: [phone]   Fax:  [phone]  Physical Therapy Treatment  Patient Details  Name: [name] MRN: [id number] Date of Birth: Jul 13, 1930 Referring Provider: Ernestine Conrad  Encounter Date: 01/07/2017      PT End of Session - 01/07/17 1457    Visit Number 5   Number of Visits 12   Date for PT Re-Evaluation 02/15/17   Authorization Type 5 g codes   PT Start Time 1410   PT Stop Time 1510   PT Time Calculation (min) 60 min   Activity Tolerance Patient tolerated treatment well;No increased pain   Behavior During Therapy WFL for tasks assessed/performed      Past Medical History:  Diagnosis Date  . Anxiety and depression   . Breast cancer ([place]) 2015   LT LUMPECTOMY  . Carpal tunnel syndrome   . Chest pain    SECONDARY TO GERD  . Chronic interstitial cystitis   . Chronic right hip pain   . Depression   . Diabetes mellitus without complication ([place])   . Diverticulosis   . Dyslipidemia   . Essential hypertension, benign   . Fibromyalgia   . Frequent falls   . GERD (gastroesophageal reflux disease)   . Hearing loss of left ear   . History of fractured rib   . History of TIA (transient ischemic attack)   . Irritable bowel   . Lumbar arthropathy ([place]) 10/04/2016   On CT scan, refer to ortho  . MS (multiple sclerosis) ([place])   . Osteoarthritis   . Paroxysmal supraventricular tachycardia ([place])   . Peripheral neuropathy    MILD  . Personal history of radiation therapy 2015   BREAST CA  . Protein malnutrition ([place])   . Pure hypercholesterolemia   . Recurrent UTI   . Skin cancer   . SOB (shortness of breath)    SECONDARY TO REFLUX  . Solitary pulmonary nodule on lung CT 12/04/2015   3 mm LLL lung nodule June 2016, March 2017  . Tachycardia, paroxysmal ([facility])    Reportedly Paroxysmal Supraventricular Tachycardia, but unable to find documentation confirming  this  . Thyroid nodule   . Vertigo   . Vitamin B 12 deficiency     Past Surgical History:  Procedure Laterality Date  . APPENDECTOMY    . AUGMENTATION MAMMAPLASTY Right 1975   BREAST BREAST ONLY  . BLADDER TACKING     X 3  . BREAST LUMPECTOMY    . BREAST SURGERY    . CARDIAC CATHETERIZATION  10/2001   Normal coronary arteries with the exception of 20% proximal D1  . CHOLECYSTECTOMY    . COLONOSCOPY WITH PROPOFOL N/A 01/21/2015   Procedure: COLONOSCOPY WITH PROPOFOL;  Surgeon: Josefine Class, MD;  Location: [facility] ENDOSCOPY;  Service: Endoscopy;  Laterality: N/A;  . ESOPHAGOGASTRODUODENOSCOPY N/A 01/21/2015   Procedure: ESOPHAGOGASTRODUODENOSCOPY (EGD);  Surgeon: Josefine Class, MD;  Location[facility] ENDOSCOPY;  Service: Endoscopy;  Laterality: N/A;  . EYE SURGERIES     WITH BUCKLE DETACHMENT OF THE RETINA  . heart monitor     Not being used... Patient can not have a MRI  . HEMORRHOID SURGERY     WITH RECONSTRUCTION  . MASTECTOMY Right 1970   SUBCUTANEOUS - NO BREAST CANCER  . TONSILLECTOMY    . TOTAL ABDOMINAL HYSTERECTOMY W/ BILATERAL SALPINGOOPHORECTOMY    . TYMPANOPLASTY      There were no vitals filed  for this visit.      Subjective Assessment - 01/07/17 1418    Subjective Pt reported her low back has been " a lot better than what it was".  3 days ago, pt stated she must have twisted wrong and then her R hip started to hurt.  Pt has an appt at [facility] tomorrow for her 2nd cortisone shot in her back. Pt experienced rectal pain after she twisted her back.     Pertinent History Arthritis, neuropathy, MS, Breast CA, lumbar surgery    Patient Stated Goals not hurt when she steps on her leg            [facility] PT Assessment - 01/07/17 1458      Palpation   SI assessment  limited mobility at R lateral border of sacrum  , tightness and tenderness along superior iliac crest . Palpation S2 level> reproduction of rectum pain (post Tx decreased)                       [facility] Adult PT Treatment/Exercise - 01/07/17 1458      Ambulation/Gait   Gait velocity 0.62ms with rollator    Gait Comments longer strides, less slumped posture      Therapeutic Activites    Therapeutic Activities --  10 ft x 4 laps, cued for feet turn before trunk,180 deg turn     Manual Therapy   Manual therapy comments Supine, inferior mob, STM at R PSIS, long distraction at thigh, MWM hip abd/ER with PA mob along lateral border of sacrum       Moist heat at R hips 8 min -un billed           PT Education - 01/07/17 1518    Education provided Yes   Education Details HEP   Person(s) Educated Patient   Methods Explanation;Demonstration;Tactile cues;Verbal cues   Comprehension Returned demonstration;Verbalized understanding             PT Long Term Goals - 01/07/17 1419      PT LONG TERM GOAL #1   Title Pt will demo no pelvic obliquities across 2 visits in order to walk without pain   Time 12   Period Weeks   Status Partially Met     PT LONG TERM GOAL #2   Title Pt will increase her gait speed from 0.48 m/s to > 1.0 m/s with rollator in order to minimal fall risk. ( 6/4: 0.638m)    Time 12   Period Weeks   Status Achieved     PT LONG TERM GOAL #3   Title Pt will decrease her ODI score from  % to <   % in order to return to ADLs   Time 12   Period Weeks   Status Deferred     PT LONG TERM GOAL #4   Title Pt will demo increased hip abduction strength from 3-/5 B to 4/5 in order to progress to dynamic stability training to ambulate int he community safely    Time 12   Period Weeks   Status Partially Met     PT LONG TERM GOAL #5   Title Pt will report no rectal pain when stepping on her foot with walking, bowel movements in order to improve QOL   Time 12   Period Weeks   Status Achieved     PT LONG TERM GOAL #6   Title Pt will report no rectal pain with palpation to SIJ  across 2 visits in order to maintain SIJ  alignment and minimize pain   Time 12   Period Weeks   Status New               Plan - 01/07/17 1457    Clinical Impression Statement Pt showed good carry over with less upper trap elevation when pushing her rollator. Pt showed improved arthrokinematics mobility of SIJ, and improved gait after today's Tx which addressed pelvic obliquities on her R side (opposite from her original pain location). Pt reported less pain following Tx. Practiced 180 deg turns with feet first to minimize relapse of lumbopelvic/SIJ issues.  Plan to prepare pt for her 10-16 hr car ride in the upcoming weeks to visit her friends and family. Pt continues to benefit from skilled PT.    Rehab Potential Good   Clinical Impairments Affecting Rehab Potential Hx of falls, hysterectomy, arthritis, neuropathy, MS, Breast CA, lumbar surgery, uses a rollator    PT Frequency 1x / week   PT Treatment/Interventions ADLs/Self Care Home Management;Aquatic Therapy;Electrical Stimulation;Neuromuscular re-education;Moist Heat;Balance training;Therapeutic exercise;Therapeutic activities;Functional mobility training;Stair training;Gait training;Patient/family education;Manual techniques;Manual lymph drainage;Scar mobilization;Taping   Consulted and Agree with Plan of Care Patient      Patient will benefit from skilled therapeutic intervention in order to improve the following deficits and impairments:  Abnormal gait, Pain, Increased muscle spasms, Decreased range of motion, Decreased safety awareness, Postural dysfunction, Improper body mechanics, Decreased strength, Decreased activity tolerance, Decreased balance, Decreased mobility, Decreased scar mobility, Decreased endurance, Impaired sensation, Decreased skin integrity, Decreased coordination, Difficulty walking, Increased fascial restricitons, Hypomobility  Visit Diagnosis: Muscle weakness (generalized)  Sacrococcygeal disorders, not elsewhere classified  Falls  frequently  Chronic bilateral low back pain without sciatica     Problem List Patient Active Problem List   Diagnosis Date Noted  . Lumbar arthropathy ([place]) 10/04/2016  . Vertigo, aural, left 07/31/2016  . Rectal pain 06/25/2016  . External hemorrhoid 06/18/2016  . Head injury due to trauma 05/09/2016  . Neoplasm of uncertain behavior of skin 05/09/2016  . Hemorrhoids 04/24/2016  . Dementia 04/24/2016  . Medication monitoring encounter 04/03/2016  . Gait abnormality 04/03/2016  . Skin lump of leg 04/03/2016  . Pain in right knee 02/15/2016  . Pain and swelling of right knee 02/13/2016  . Right knee sprain 02/13/2016  . Solitary pulmonary nodule on lung CT 12/04/2015  . Generalized degenerative joint disease of hand 11/21/2015  . Triggering of digit 11/21/2015  . Snapping thumb syndrome 11/21/2015  . Pain, joint, hand 11/11/2015  . Coronary artery calcification seen on CAT scan 11/11/2015  . Multiple sclerosis ([place]) 11/11/2015  . Chronic pain of multiple joints 09/02/2015  . Right lower quadrant abdominal pain 08/02/2015  . Microscopic hematuria 07/12/2015  . Recurrent UTI 04/09/2015  . Carpal tunnel syndrome 01/12/2014  . DD (diverticular disease) 01/12/2014  . Ductal carcinoma in situ of left breast 01/12/2014  . Hypercholesteremia 01/12/2014  . DS (disseminated sclerosis) ([place]) 01/12/2014  . Arthritis, degenerative 01/12/2014  . Thyroid nodule 01/12/2014  . Cyanocobalamine deficiency (non anemic) 01/12/2014  . Anxiety and depression 01/12/2014  . Intraductal carcinoma of breast 01/12/2014  . History of right mastectomy 01/12/2014  . DM (diabetes mellitus) type II controlled, neurological manifestation ([place]) 06/26/2010  . Depression, major, recurrent, in partial remission ([place]) 06/26/2010  . Frequent falls 06/26/2010  . Stricture and stenosis of esophagus 06/26/2010  . Gastro-esophageal reflux disease without esophagitis 06/26/2010  . Fibromyalgia 06/26/2010  .  Chronic interstitial cystitis  06/26/2010  . H/O malignant neoplasm of breast 06/26/2010  . Hypertension goal BP (blood pressure) < 140/90 06/01/2009  . H/O paroxysmal supraventricular tachycardia 06/01/2009  . Paroxysmal supraventricular tachycardia ([place]) 06/01/2009  . Irritable bowel syndrome with constipation and diarrhea 06/16/2008  . Diarrhea, functional 06/16/2008  . Diarrhea, unspecified 06/16/2008    Jerl Mina ,PT, DPT, E-RYT  01/07/2017, 3:19 PM  [place] MAIN [facility] SERVICES [address] [place], [place], [address] Phone: [phone]   Fax:  [phone]  Name: [name] MRN: [id number] Date of Birth: 06-09-1930

## 2017-01-08 DIAGNOSIS — M7061 Trochanteric bursitis, right hip: Secondary | ICD-10-CM | POA: Diagnosis not present

## 2017-01-08 DIAGNOSIS — M5136 Other intervertebral disc degeneration, lumbar region: Secondary | ICD-10-CM | POA: Diagnosis not present

## 2017-01-09 ENCOUNTER — Emergency Department
Admission: EM | Admit: 2017-01-09 | Discharge: 2017-01-09 | Disposition: A | Discharge status: Home / Self Care | Payer: Medicare HMO | Attending: Emergency Medicine | Admitting: Emergency Medicine

## 2017-01-09 ENCOUNTER — Encounter: Payer: Self-pay | Admitting: Emergency Medicine

## 2017-01-09 ENCOUNTER — Emergency Department: Payer: Medicare HMO

## 2017-01-09 DIAGNOSIS — Z7984 Long term (current) use of oral hypoglycemic drugs: Secondary | ICD-10-CM | POA: Insufficient documentation

## 2017-01-09 DIAGNOSIS — N200 Calculus of kidney: Secondary | ICD-10-CM | POA: Insufficient documentation

## 2017-01-09 DIAGNOSIS — Z8673 Personal history of transient ischemic attack (TIA), and cerebral infarction without residual deficits: Secondary | ICD-10-CM | POA: Insufficient documentation

## 2017-01-09 DIAGNOSIS — Z79899 Other long term (current) drug therapy: Secondary | ICD-10-CM | POA: Insufficient documentation

## 2017-01-09 DIAGNOSIS — E119 Type 2 diabetes mellitus without complications: Secondary | ICD-10-CM | POA: Diagnosis not present

## 2017-01-09 DIAGNOSIS — Z85828 Personal history of other malignant neoplasm of skin: Secondary | ICD-10-CM | POA: Insufficient documentation

## 2017-01-09 DIAGNOSIS — Z7982 Long term (current) use of aspirin: Secondary | ICD-10-CM | POA: Diagnosis not present

## 2017-01-09 DIAGNOSIS — N1330 Unspecified hydronephrosis: Secondary | ICD-10-CM | POA: Diagnosis not present

## 2017-01-09 DIAGNOSIS — Z853 Personal history of malignant neoplasm of breast: Secondary | ICD-10-CM | POA: Diagnosis not present

## 2017-01-09 DIAGNOSIS — R1031 Right lower quadrant pain: Secondary | ICD-10-CM | POA: Diagnosis present

## 2017-01-09 LAB — LACTIC ACID, PLASMA: LACTIC ACID, VENOUS: 1.6 mmol/L (ref 0.5–1.9)

## 2017-01-09 LAB — COMPREHENSIVE METABOLIC PANEL
ALK PHOS: 53 U/L (ref 38–126)
ALT: 18 U/L (ref 14–54)
AST: 15 U/L (ref 15–41)
Albumin: 4.2 g/dL (ref 3.5–5.0)
Anion gap: 9 (ref 5–15)
BILIRUBIN TOTAL: 0.7 mg/dL (ref 0.3–1.2)
BUN: 23 mg/dL — AB (ref 6–20)
CO2: 23 mmol/L (ref 22–32)
CREATININE: 1.18 mg/dL — AB (ref 0.44–1.00)
Calcium: 9.3 mg/dL (ref 8.9–10.3)
Chloride: 107 mmol/L (ref 101–111)
GFR calc Af Amer: 47 mL/min — ABNORMAL LOW (ref >60)
GFR, EST NON AFRICAN AMERICAN: 41 mL/min — AB (ref >60)
Glucose, Bld: 156 mg/dL — ABNORMAL HIGH (ref 65–99)
Potassium: 4.4 mmol/L (ref 3.5–5.1)
Sodium: 139 mmol/L (ref 135–145)
TOTAL PROTEIN: 7.3 g/dL (ref 6.5–8.1)

## 2017-01-09 LAB — LIPASE, BLOOD: LIPASE: 49 U/L (ref 11–51)

## 2017-01-09 LAB — CBC
HCT: 40.5 % (ref 35.0–47.0)
Hemoglobin: 13.4 g/dL (ref 12.0–16.0)
MCH: 31 pg (ref 26.0–34.0)
MCHC: 33 g/dL (ref 32.0–36.0)
MCV: 94 fL (ref 80.0–100.0)
PLATELETS: 203 10*3/uL (ref 150–440)
RBC: 4.31 MIL/uL (ref 3.80–5.20)
RDW: 15 % — ABNORMAL HIGH (ref 11.5–14.5)
WBC: 8.5 10*3/uL (ref 3.6–11.0)

## 2017-01-09 LAB — URINALYSIS, COMPLETE (UACMP) WITH MICROSCOPIC
Bilirubin Urine: NEGATIVE
Glucose, UA: NEGATIVE mg/dL
Ketones, ur: NEGATIVE mg/dL
LEUKOCYTES UA: NEGATIVE
NITRITE: NEGATIVE
Protein, ur: NEGATIVE mg/dL
SPECIFIC GRAVITY, URINE: 1.019 (ref 1.005–1.030)
pH: 5 (ref 5.0–8.0)

## 2017-01-09 MED ORDER — FENTANYL CITRATE (PF) 100 MCG/2ML IJ SOLN
50.0000 ug | Freq: Once | INTRAMUSCULAR | Status: AC
  Administered 2017-01-09: 50 ug via INTRAVENOUS
  Filled 2017-01-09: qty 2

## 2017-01-09 MED ORDER — OXYCODONE-ACETAMINOPHEN 5-325 MG PO TABS: 1.0000 | ORAL_TABLET | Freq: Four times a day (QID) | ORAL | 0 refills | Status: DC | PRN

## 2017-01-09 MED ORDER — OXYCODONE-ACETAMINOPHEN 5-325 MG PO TABS
1.0000 | ORAL_TABLET | Freq: Once | ORAL | Status: AC
  Administered 2017-01-09: 1 via ORAL
  Filled 2017-01-09: qty 1

## 2017-01-09 MED ORDER — IBUPROFEN 200 MG PO TABS: 400.0000 mg | ORAL_TABLET | Freq: Three times a day (TID) | ORAL | 0 refills | Status: DC | PRN

## 2017-01-09 MED ORDER — BARIUM SULFATE 2.1 % PO SUSP
450.0000 mL | ORAL | Status: AC
  Administered 2017-01-09: 450 mL via ORAL

## 2017-01-09 MED ORDER — KETOROLAC TROMETHAMINE 30 MG/ML IJ SOLN
10.0000 mg | Freq: Once | INTRAMUSCULAR | Status: AC
  Administered 2017-01-09: 9.9 mg via INTRAVENOUS
  Filled 2017-01-09: qty 1

## 2017-01-09 NOTE — ED Provider Notes (Signed)
[name]  ____________________________________________   First MD Initiated Contact with Flores 01/09/17 1312     (approximate)  I have reviewed the triage vital signs and the nursing notes.   HISTORY  Chief Complaint Back Pain and Abdominal Pain    HPI [name] is a 83 y.o. female who self presents to the emergency department with severe cramping right lower quadrant abdominal pain, right hip pain, and low back pain for the past day. She has chronic low back and right hip pain but she said she woke up this morning her pain was more severe than usual. She has a complex past medical history including significant arthritis in her back and is status post L4-L5 fusion as well as a recent diagnosis of trochanteric bursitis on the right. She also has a history of diverticulitis. Had no recent diarrhea but is been considerably constipated. Her only home pain medication is tramadol which she says does not help her pain.She also has a remote history of breast cancer. According to her husband the Flores's abdomen has been slowly swelling for the past month or so.   Past Medical History:  Diagnosis Date  . Anxiety and depression   . Breast cancer ([place]) 2015   LT LUMPECTOMY  . Carpal tunnel syndrome   . Chest pain    SECONDARY TO GERD  . Chronic interstitial cystitis   . Chronic right hip pain   . Depression   . Diabetes mellitus without complication ([place])   . Diverticulosis   . Dyslipidemia   . Essential hypertension, benign   . Fibromyalgia   . Frequent falls   . GERD (gastroesophageal reflux disease)   . Hearing loss of left ear   . History of fractured rib   . History of TIA (transient ischemic attack)   . Irritable bowel   . Lumbar arthropathy ([place]) 10/04/2016   On CT scan, refer to ortho  . MS (multiple sclerosis) ([place])   . Osteoarthritis   . Paroxysmal supraventricular tachycardia ([place])   . Peripheral  neuropathy    MILD  . Personal history of radiation therapy 2015   BREAST CA  . Protein malnutrition ([place])   . Pure hypercholesterolemia   . Recurrent UTI   . Skin cancer   . SOB (shortness of breath)    SECONDARY TO REFLUX  . Solitary pulmonary nodule on lung CT 12/04/2015   3 mm LLL lung nodule June 2016, March 2017  . Tachycardia, paroxysmal ([facility])    Reportedly Paroxysmal Supraventricular Tachycardia, but unable to find documentation confirming this  . Thyroid nodule   . Vertigo   . Vitamin B 12 deficiency     Flores Active Problem List   Diagnosis Date Noted  . Lumbar arthropathy ([place]) 10/04/2016  . Vertigo, aural, left 07/31/2016  . Rectal pain 06/25/2016  . External hemorrhoid 06/18/2016  . Head injury due to trauma 05/09/2016  . Neoplasm of uncertain behavior of skin 05/09/2016  . Hemorrhoids 04/24/2016  . Dementia 04/24/2016  . Medication monitoring encounter 04/03/2016  . Gait abnormality 04/03/2016  . Skin lump of leg 04/03/2016  . Pain in right knee 02/15/2016  . Pain and swelling of right knee 02/13/2016  . Right knee sprain 02/13/2016  . Solitary pulmonary nodule on lung CT 12/04/2015  . Generalized degenerative joint disease of hand 11/21/2015  . Triggering of digit 11/21/2015  . Snapping thumb syndrome 11/21/2015  . Pain, joint, hand 11/11/2015  .  Coronary artery calcification seen on CAT scan 11/11/2015  . Multiple sclerosis ([place]) 11/11/2015  . Chronic pain of multiple joints 09/02/2015  . Right lower quadrant abdominal pain 08/02/2015  . Microscopic hematuria 07/12/2015  . Recurrent UTI 04/09/2015  . Carpal tunnel syndrome 01/12/2014  . DD (diverticular disease) 01/12/2014  . Ductal carcinoma in situ of left breast 01/12/2014  . Hypercholesteremia 01/12/2014  . DS (disseminated sclerosis) ([place]) 01/12/2014  . Arthritis, degenerative 01/12/2014  . Thyroid nodule 01/12/2014  . Cyanocobalamine deficiency (non anemic) 01/12/2014  . Anxiety and  depression 01/12/2014  . Intraductal carcinoma of breast 01/12/2014  . History of right mastectomy 01/12/2014  . DM (diabetes mellitus) type II controlled, neurological manifestation ([place]) 06/26/2010  . Depression, major, recurrent, in partial remission ([place]) 06/26/2010  . Frequent falls 06/26/2010  . Stricture and stenosis of esophagus 06/26/2010  . Gastro-esophageal reflux disease without esophagitis 06/26/2010  . Fibromyalgia 06/26/2010  . Chronic interstitial cystitis 06/26/2010  . H/O malignant neoplasm of breast 06/26/2010  . Hypertension goal BP (blood pressure) < 140/90 06/01/2009  . H/O paroxysmal supraventricular tachycardia 06/01/2009  . Paroxysmal supraventricular tachycardia ([place]) 06/01/2009  . Irritable bowel syndrome with constipation and diarrhea 06/16/2008  . Diarrhea, functional 06/16/2008  . Diarrhea, unspecified 06/16/2008    Past Surgical History:  Procedure Laterality Date  . APPENDECTOMY    . AUGMENTATION MAMMAPLASTY Right 1975   BREAST BREAST ONLY  . BLADDER TACKING     X 3  . BREAST LUMPECTOMY    . BREAST SURGERY    . CARDIAC CATHETERIZATION  10/2001   Normal coronary arteries with the exception of 20% proximal D1  . CHOLECYSTECTOMY    . COLONOSCOPY WITH PROPOFOL N/A 01/21/2015   Procedure: COLONOSCOPY WITH PROPOFOL;  Surgeon: Josefine Class, MD;  [facility] ENDOSCOPY;  Service: Endoscopy;  Laterality: N/A;  . ESOPHAGOGASTRODUODENOSCOPY N/A 01/21/2015   Procedure: ESOPHAGOGASTRODUODENOSCOPY (EGD);  Surgeon: Josefine Class, MD;  Location: [facility] ENDOSCOPY;  Service: Endoscopy;  Laterality: N/A;  . EYE SURGERIES     WITH BUCKLE DETACHMENT OF THE RETINA  . heart monitor     Not being used... Flores can not have a MRI  . HEMORRHOID SURGERY     WITH RECONSTRUCTION  . MASTECTOMY Right 1970   SUBCUTANEOUS - NO BREAST CANCER  . TONSILLECTOMY    . TOTAL ABDOMINAL HYSTERECTOMY W/ BILATERAL SALPINGOOPHORECTOMY    . TYMPANOPLASTY      Prior to  Admission medications   Medication Sig Start Date End Date Taking? Authorizing Provider  acetaminophen (TYLENOL) 500 MG tablet Take by mouth.    [provider]  amLODipine (NORVASC) 2.5 MG tablet TAKE 1 TABLET (2.5 MG TOTAL) BY MOUTH DAILY. 10/29/16   Arnetha Courser, MD  aspirin EC 81 MG tablet Take 1 tablet (81 mg total) by mouth daily. 11/11/15   Arnetha Courser, MD  atorvastatin (LIPITOR) 40 MG tablet Take 1 tablet (40 mg total) by mouth at bedtime. 10/22/16   Arnetha Courser, MD  b complex vitamins tablet Take 1 tablet by mouth daily.    [provider]  Carboxymethylcell-Hypromellose (GENTEAL OP) Apply 1 drop to eye. Each eye as needed    [provider]  Cholecalciferol (VITAMIN D3) 50000 units CAPS Take 5,000 Int'l Units/day by mouth once a week. Reported on 01/25/2016 04/04/16   Arnetha Courser, MD  Cyanocobalamin (VITAMIN B-12) 1000 MCG SUBL  09/16/15   [provider]  estradiol (ESTRACE VAGINAL) 0.1 MG/GM vaginal cream  Place 1 Applicatorful vaginally at bedtime.    [provider]  fluticasone (FLONASE) 50 MCG/ACT nasal spray SPRAY 2 SPRAYS EACH INTO BOTH NOSTRILS ONCE DAILY EACH NIGHT. 10/19/15   [provider]  hydrocortisone (PROCTO-PAK) 1 % CREA Apply to rectum twice a day 09/03/16   Arnetha Courser, MD  ibuprofen (MOTRIN IB) 200 MG tablet Take 2 tablets (400 mg total) by mouth every 8 (eight) hours as needed for moderate pain. 01/09/17 01/14/17  Darel Hong, MD  meclizine (ANTIVERT) 25 MG tablet Take 1 tablet (25 mg total) by mouth every 8 (eight) hours as needed for dizziness. 07/31/16   Arnetha Courser, MD  memantine (NAMENDA) 10 MG tablet  08/11/16   [provider]  metFORMIN (GLUCOPHAGE) 500 MG tablet Take 1 tablet (500 mg total) by mouth 2 (two) times daily with a meal. Flores taking differently: Take 500 mg by mouth daily.  12/29/15   Arnetha Courser, MD  Misc. Devices (HUGO ROLLING WALKER ELITE) MISC Rolling walker;  knee sprain, arthritis; LON 99 months 02/13/16   Arnetha Courser, MD  ondansetron (ZOFRAN) 4 MG tablet Take 4 mg by mouth every 8 (eight) hours as needed for nausea or vomiting. Reported on 12/22/2015    [provider]  oxyCODONE-acetaminophen (ROXICET) 5-325 MG tablet Take 1 tablet by mouth every 6 (six) hours as needed. 01/09/17 01/09/18  Darel Hong, MD  Probiotic Product (PROBIOTIC COLON SUPPORT) CAPS Take 1 capsule by mouth daily.     [provider]  sertraline (ZOLOFT) 50 MG tablet Take 1 tablet (50 mg total) by mouth daily. 02/13/16   Lada, Satira Anis, MD  Simethicone (GAS-X EXTRA STRENGTH) 125 MG CAPS Take 1 capsule by mouth as needed. Reported on 11/24/2015    [provider]    Allergies Aricept [donepezil hcl]; Biaxin [clarithromycin]; Copaxone [glatiramer acetate]; Decadron [dexamethasone]; Dexamethasone sodium phosphate; Diltiazem hcl; Diltiazem hcl; Flagyl [metronidazole]; Gabapentin; Iohexol; Ivp dye [iodinated diagnostic agents]; Levothyroxine; Macrobid [nitrofurantoin macrocrystal]; Sulfa antibiotics; Sulfonamide derivatives; Synthroid [levothyroxine sodium]; Copaxone  [glatiramer]; and Interferon beta-1a  Family History  Problem Relation Age of Onset  . ALS Mother   . Kidney disease Sister   . Arthritis Sister   . Aneurysm Brother   . Heart disease Daughter   . Colon cancer Unknown   . Breast cancer Neg Hx   . Bladder Cancer Neg Hx   . Prostate cancer Neg Hx     Social History Social History  Substance Use Topics  . Smoking status: Never Smoker  . Smokeless tobacco: Never Used  . Alcohol use No    Review of Systems Constitutional: No fever/chills Eyes: No visual changes. ENT: No sore throat. Cardiovascular: Denies chest pain. Respiratory: Denies shortness of breath. Gastrointestinal: Positive abdominal pain.  Positive nausea, no vomiting.  No diarrhea.  Positive constipation. Genitourinary: Negative for dysuria. Musculoskeletal:  Positive for back pain. Skin: Negative for rash. Neurological: Negative for headaches, focal weakness or numbness.   ____________________________________________   PHYSICAL EXAM:  VITAL SIGNS: ED Triage Vitals  Enc Vitals Group     BP 01/09/17 1110 (!) 146/63     Pulse Rate 01/09/17 1110 82     Resp 01/09/17 1110 18     Temp 01/09/17 1110 98.7 F (37.1 C)     Temp Source 01/09/17 1110 Oral     SpO2 01/09/17 1110 96 %     Weight 01/09/17 1111 150 lb (68 kg)     Height 01/09/17 1111 5'  3" (1.6 m)     Head Circumference --      Peak Flow --      Pain Score 01/09/17 1110 10     Pain Loc --      Pain Edu? --      Excl. in [place]? --     Constitutional: Alert and oriented x 4 well appearing nontoxic no diaphoresis speaks in full, clear sentences Eyes: PERRL EOMI. Head: Atraumatic. Nose: No congestion/rhinnorhea. Mouth/Throat: No trismus Neck: No stridor.   Cardiovascular: Normal rate, regular rhythm. Grossly normal heart sounds.  Good peripheral circulation. Respiratory: Normal respiratory effort.  No retractions. Lungs CTAB and moving good air Gastrointestinal: Somewhat distended abdomen quite tender right lower quadrant with rebound but no frank peritonitis Musculoskeletal: No lower extremity edema   Neurologic:  Normal speech and language. No gross focal neurologic deficits are appreciated. Skin:  Skin is warm, dry and intact. No rash noted. Psychiatric: Mood and affect are normal. Speech and behavior are normal.    __ ____________________________________________   LABS (all labs ordered are listed, but only abnormal results are displayed)  Labs Reviewed  COMPREHENSIVE METABOLIC PANEL - Abnormal; Notable for the following:       Result Value   Glucose, Bld 156 (*)    BUN 23 (*)    Creatinine, Ser 1.18 (*)    GFR calc non Af Amer 41 (*)    GFR calc Af Amer 47 (*)    All other components within normal limits  CBC - Abnormal; Notable for the following:    RDW 15.0  (*)    All other components within normal limits  URINALYSIS, COMPLETE (UACMP) WITH MICROSCOPIC - Abnormal; Notable for the following:    Color, Urine AMBER (*)    APPearance HAZY (*)    Hgb urine dipstick MODERATE (*)    Bacteria, UA RARE (*)    Squamous Epithelial / LPF 0-5 (*)    All other components within normal limits  LIPASE, BLOOD  LACTIC ACID, PLASMA  LACTIC ACID, PLASMA    Urinalysis positive for blood negative for infection __________________________________________  EKG   ____________________________________________  RADIOLOGY  CT abdomen and pelvis with oral contrast positive for 1-2 mm stone at the UVJ on the right ____________________________________________   PROCEDURES  Procedure(s) performed: no  Procedures  Critical Care performed: no  Observation: no ____________________________________________   INITIAL IMPRESSION / ASSESSMENT AND PLAN / ED COURSE  Pertinent labs & imaging results that were available during my care of the Flores were reviewed by me and considered in my medical decision making (see chart for details).  On arrival the Flores is clearly uncomfortable. She has right lower quadrant pain, right flank pain, right back pain. Differential is broad but given her history of chronic back pain includes musculoskeletal pain, diverticulitis, malignancy, and renal colic. She is anaphylactic to IV contrast so she will require CT scan with oral contrast only. A low dose of morphine and Toradol as her renal function is normal today.  The Flores's CT scan is positive for a small right-sided kidney stone. As it is so swollen most likely pass on its own. She R he has a urologist she follows up with his I will give her a short course of low-dose ibuprofen as well as Percocet for breakthrough pain and refer her back to her urologist. She understands to return immediately should she develop any fevers.       ____________________________________________   FINAL CLINICAL IMPRESSION(S) / ED  DIAGNOSES  Final diagnoses:  Kidney stone      NEW MEDICATIONS STARTED DURING THIS VISIT:  Discharge Medication List as of 01/09/2017  4:12 PM    START taking these medications   Details  ibuprofen (MOTRIN IB) 200 MG tablet Take 2 tablets (400 mg total) by mouth every 8 (eight) hours as needed for moderate pain., Starting Wed 01/09/2017, Until Mon 01/14/2017, Print    oxyCODONE-acetaminophen (ROXICET) 5-325 MG tablet Take 1 tablet by mouth every 6 (six) hours as needed., Starting Wed 01/09/2017, Until Thu 01/09/2018, Print         Flores:  This document was prepared using Dragon voice recognition software and may include unintentional dictation errors.     Darel Hong, MD 01/09/17 Sharilyn Sites

## 2017-01-09 NOTE — ED Notes (Signed)
Patient finished first bottle of barium sulfate. CT tech informed and stated that patient needs to start 2nd bottle around 1440. Patient and family aware.

## 2017-01-09 NOTE — Discharge Instructions (Addendum)
Please take 400 mg of ibuprofen 3 times a day for only up to 5 days as needed for ear pain. Use Percocet only when the pain is severe. The most important thing to know about kidney stones is that if you develop a fever at all while you have a kidney stone it is extremely serious and he must return immediately to the emergency department.  Follow-up with your urologist as needed and if your symptoms have not resolved within 4 days.  Please return to the emergency department for any concerns.  It was a pleasure to take care of you today, and thank you for coming to our emergency department.  If you have any questions or concerns before leaving please ask the nurse to grab me and I'm more than happy to go through your aftercare instructions again.  If you were prescribed any opioid pain medication today such as Norco, Vicodin, Percocet, morphine, hydrocodone, or oxycodone please make sure you do not drive when you are taking this medication as it can alter your ability to drive safely.  If you have any concerns once you are home that you are not improving or are in fact getting worse before you can make it to your follow-up appointment, please do not hesitate to call 911 and come back for further evaluation.  Darel Hong MD  Results for orders placed or performed during the hospital encounter of 01/09/17  Lipase, blood  Result Value Ref Range   Lipase 49 11 - 51 U/L  Comprehensive metabolic panel  Result Value Ref Range   Sodium 139 135 - 145 mmol/L   Potassium 4.4 3.5 - 5.1 mmol/L   Chloride 107 101 - 111 mmol/L   CO2 23 22 - 32 mmol/L   Glucose, Bld 156 (H) 65 - 99 mg/dL   BUN 23 (H) 6 - 20 mg/dL   Creatinine, Ser 1.18 (H) 0.44 - 1.00 mg/dL   Calcium 9.3 8.9 - 10.3 mg/dL   Total Protein 7.3 6.5 - 8.1 g/dL   Albumin 4.2 3.5 - 5.0 g/dL   AST 15 15 - 41 U/L   ALT 18 14 - 54 U/L   Alkaline Phosphatase 53 38 - 126 U/L   Total Bilirubin 0.7 0.3 - 1.2 mg/dL   GFR calc non Af Amer 41 (L)  >60 mL/min   GFR calc Af Amer 47 (L) >60 mL/min   Anion gap 9 5 - 15  CBC  Result Value Ref Range   WBC 8.5 3.6 - 11.0 K/uL   RBC 4.31 3.80 - 5.20 MIL/uL   Hemoglobin 13.4 12.0 - 16.0 g/dL   HCT 40.5 35.0 - 47.0 %   MCV 94.0 80.0 - 100.0 fL   MCH 31.0 26.0 - 34.0 pg   MCHC 33.0 32.0 - 36.0 g/dL   RDW 15.0 (H) 11.5 - 14.5 %   Platelets 203 150 - 440 K/uL  Urinalysis, Complete w Microscopic  Result Value Ref Range   Color, Urine AMBER (A) YELLOW   APPearance HAZY (A) CLEAR   Specific Gravity, Urine 1.019 1.005 - 1.030   pH 5.0 5.0 - 8.0   Glucose, UA NEGATIVE NEGATIVE mg/dL   Hgb urine dipstick MODERATE (A) NEGATIVE   Bilirubin Urine NEGATIVE NEGATIVE   Ketones, ur NEGATIVE NEGATIVE mg/dL   Protein, ur NEGATIVE NEGATIVE mg/dL   Nitrite NEGATIVE NEGATIVE   Leukocytes, UA NEGATIVE NEGATIVE   RBC / HPF 6-30 0 - 5 RBC/hpf   WBC, UA 0-5 0 -  5 WBC/hpf   Bacteria, UA RARE (A) NONE SEEN   Squamous Epithelial / LPF 0-5 (A) NONE SEEN   Mucous PRESENT    Hyaline Casts, UA PRESENT   Lactic acid, plasma  Result Value Ref Range   Lactic Acid, Venous 1.6 0.5 - 1.9 mmol/L   Ct Abdomen Pelvis Wo Contrast  Result Date: 01/09/2017 CLINICAL DATA:  Severe right low back and lower abdominal pain. EXAM: CT ABDOMEN AND PELVIS WITHOUT CONTRAST TECHNIQUE: Multidetector CT imaging of the abdomen and pelvis was performed following the standard protocol without IV contrast. COMPARISON:  CT abdomen and pelvis 08/28/2016. FINDINGS: Lower chest: Lung bases clear. No pleural or pericardial effusion. There is some thickening of the walls of the distal esophagus. Hepatobiliary: Status post cholecystectomy.  Otherwise negative. Pancreas: Unremarkable. No pancreatic ductal dilatation or surrounding inflammatory changes. Spleen: 3.9 cm cystic lesion in the spleen is unchanged. The spleen is otherwise unremarkable. Adrenals/Urinary Tract: There is moderate right hydronephrosis due to a 1-2 mm stone at the right UVJ,  new since the most recent CT. The appearance of the kidneys is otherwise unchanged. The adrenal glands and urinary bladder appear normal. Stomach/Bowel: Diverticulosis is most notable in the descending and sigmoid colon. No diverticulitis. The stomach and small bowel appear normal. The appendix has been removed. Vascular/Lymphatic: Scattered aortic atherosclerosis without aneurysm. Reproductive: Status post hysterectomy. No adnexal masses. Other: No pelvic fluid. Musculoskeletal: No lytic or sclerotic lesion. The patient is status post L4-5 fusion. Severe appearing lumbar spondylosis L1-2, L2-3 and L3-4 is again seen. IMPRESSION: Moderate right hydronephrosis due to a 1-2 mm stone at the right UVJ, new since the most recent CT. Diverticulosis without diverticulitis. Atherosclerosis. Lumbar spondylosis. Electronically Signed   By: Inge Rise M.D.   On: 01/09/2017 15:37

## 2017-01-09 NOTE — ED Notes (Signed)
Patient has started 2nd bottle of barium sulfate.

## 2017-01-09 NOTE — ED Notes (Signed)
Patient's elderly husband went out to the parking lot with his walker to get the car to pick up the patient [place] the [address]. Husband was unable to locate car. This Probation officer brought the couple into the lobby and informed the Engineer, structural and first nurse of the problem. The police officer was going to assist the couple in finding their car.

## 2017-01-09 NOTE — ED Triage Notes (Signed)
Patient presents to the ED with severe right lower back pain and severe right lower abdominal pain.  Patient states she has had slight pain the past two days but it has been much worse since this morning.  Patient reports nausea but denies vomiting and diarrhea.  Patient has history of back surgery and diverticulitis.  Patient appears uncomfortable, holding her back in triage.

## 2017-01-10 ENCOUNTER — Other Ambulatory Visit: Payer: Self-pay | Admitting: Family Medicine

## 2017-01-10 ENCOUNTER — Other Ambulatory Visit: Payer: Self-pay

## 2017-01-10 MED ORDER — ATORVASTATIN CALCIUM 40 MG PO TABS: 40.0000 mg | ORAL_TABLET | Freq: Every day | ORAL | 0 refills | Status: DC

## 2017-01-10 NOTE — Telephone Encounter (Signed)
Please remind patient to have fasting lipids done ASAP We increased her dose of statin and were supposed to check the response I do NOT need the ALT (SGPT) any more since this was just done at the hospital; just lipids please Thank you

## 2017-01-11 ENCOUNTER — Other Ambulatory Visit: Payer: Self-pay

## 2017-01-11 ENCOUNTER — Encounter: Payer: Commercial Managed Care - HMO | Admitting: Physical Therapy

## 2017-01-11 ENCOUNTER — Ambulatory Visit (INDEPENDENT_AMBULATORY_CARE_PROVIDER_SITE_OTHER): Payer: Medicare HMO | Admitting: Family Medicine

## 2017-01-11 ENCOUNTER — Encounter: Payer: Self-pay | Admitting: Family Medicine

## 2017-01-11 VITALS — BP 108/64 | HR 83 | Temp 98.3°F | Resp 16 | Wt 140.4 lb

## 2017-01-11 DIAGNOSIS — N289 Disorder of kidney and ureter, unspecified: Secondary | ICD-10-CM

## 2017-01-11 DIAGNOSIS — N200 Calculus of kidney: Secondary | ICD-10-CM

## 2017-01-11 DIAGNOSIS — E1140 Type 2 diabetes mellitus with diabetic neuropathy, unspecified: Secondary | ICD-10-CM | POA: Diagnosis not present

## 2017-01-11 DIAGNOSIS — N390 Urinary tract infection, site not specified: Secondary | ICD-10-CM | POA: Diagnosis not present

## 2017-01-11 DIAGNOSIS — K228 Other specified diseases of esophagus: Secondary | ICD-10-CM | POA: Insufficient documentation

## 2017-01-11 DIAGNOSIS — K219 Gastro-esophageal reflux disease without esophagitis: Secondary | ICD-10-CM

## 2017-01-11 DIAGNOSIS — K2289 Other specified disease of esophagus: Secondary | ICD-10-CM

## 2017-01-11 DIAGNOSIS — E7800 Pure hypercholesterolemia, unspecified: Secondary | ICD-10-CM

## 2017-01-11 DIAGNOSIS — E041 Nontoxic single thyroid nodule: Secondary | ICD-10-CM

## 2017-01-11 HISTORY — DX: Other specified disease of esophagus: K22.89

## 2017-01-11 LAB — TSH: TSH: 2.81 mIU/L

## 2017-01-11 MED ORDER — OMEPRAZOLE 20 MG PO CPDR: 20.0000 mg | DELAYED_RELEASE_CAPSULE | Freq: Every day | ORAL | 1 refills | Status: DC

## 2017-01-11 NOTE — Assessment & Plan Note (Signed)
See urologist on Monday; start back on daily prophylaxis

## 2017-01-11 NOTE — Assessment & Plan Note (Signed)
Due to have lipids rechecked, will draw today

## 2017-01-11 NOTE — Patient Instructions (Addendum)
We'll have you see the gastroenterologist about your esophagus Do see the urologist about your kidney stone Avoid excess salt; do not add salt to your food (cooking or at the table) Stay hydrated Finish out the prednisone and do NOT take any ibuprofen or meloxicam while you are on prednisone Start back on the omeprazole and take 20 mg by mouth every single day STOP your metformin for the next week, or until you are feeling better and your kidney problem has completely resolved Try to limit or avoid triggers like coffee, caffeinated beverages, onions, chocolate, spicy foods, peppermint, acidic foods like pizza, spaghetti sauce, and orange juice Lose weight if you are overweight or obese Try elevating the head of your bed by placing a small wedge between your mattress and box springs to keep acid in the stomach at night instead of coming up into your esophagus

## 2017-01-11 NOTE — Assessment & Plan Note (Signed)
Refer back to GI; continue PPI

## 2017-01-11 NOTE — Assessment & Plan Note (Signed)
Followed by endocrinologist; check TSH

## 2017-01-11 NOTE — Assessment & Plan Note (Addendum)
Controlled; hold metformin until kidney issue resolved

## 2017-01-11 NOTE — Progress Notes (Signed)
BP 108/64 (BP Location: Left Arm, Patient Position: Sitting, Cuff Size: Normal)   Pulse 83   Temp 98.3 F (36.8 C) (Oral)   Resp 16   Wt 140 lb 6.4 oz (63.7 kg)   SpO2 95%   BMI 24.87 kg/m    Subjective:    Patient ID: [name], female    DOB: [birth year], 83 y.o.   MRN: [id number]  HPI: [name] is a 83 y.o. female  Chief Complaint  Patient presents with  . Hospitalization Follow-up    Kidney stones    HPI She is here for f/u with kidney stones Went to the [facility] urgent care and they put her in a wheelchair and sent her to the ER She had so much pain No blood in the urine, but she's not really looking, trouble with eye sight Has Rx for oxycodone, but has not filled yet (did not realize that that paper was the actual prescription) She was in the ER, not hospitalized Not taking any PPI right now Reviewed medicines; meloxicam in bag, but not taking; Rx for ibuprofen in her hand, not taking, and I wrote do not take while on prednisone Her back doctor started her on prednisone a few days ago Not taking the trimethoprim from urologist; has appt with urologist on Monday  CT abdomen pelvis on January 09, 2017: CLINICAL DATA:  Severe right low back and lower abdominal pain.  EXAM: CT ABDOMEN AND PELVIS WITHOUT CONTRAST  TECHNIQUE: Multidetector CT imaging of the abdomen and pelvis was performed following the standard protocol without IV contrast.  COMPARISON:  CT abdomen and pelvis 08/28/2016.  FINDINGS: Lower chest: Lung bases clear. No pleural or pericardial effusion. There is some thickening of the walls of the distal esophagus.  Hepatobiliary: Status post cholecystectomy.  Otherwise negative.  Pancreas: Unremarkable. No pancreatic ductal dilatation or surrounding inflammatory changes.  Spleen: 3.9 cm cystic lesion in the spleen is unchanged. The spleen is otherwise unremarkable.  Adrenals/Urinary Tract: There is moderate right  hydronephrosis due to a 1-2 mm stone at the right UVJ, new since the most recent CT. The appearance of the kidneys is otherwise unchanged. The adrenal glands and urinary bladder appear normal.  Stomach/Bowel: Diverticulosis is most notable in the descending and sigmoid colon. No diverticulitis. The stomach and small bowel appear normal. The appendix has been removed.  Vascular/Lymphatic: Scattered aortic atherosclerosis without aneurysm.  Reproductive: Status post hysterectomy. No adnexal masses.  Other: No pelvic fluid.  Musculoskeletal: No lytic or sclerotic lesion. The patient is status post L4-5 fusion. Severe appearing lumbar spondylosis L1-2, L2-3 and L3-4 is again seen.  IMPRESSION: Moderate right hydronephrosis due to a 1-2 mm stone at the right UVJ, new since the most recent CT.  Diverticulosis without diverticulitis.  Atherosclerosis.  Lumbar spondylosis.   Electronically Signed   By: Inge Rise M.D.   On: 01/09/2017 15:37  We talked about the esophageal thickening; she has trouble swallowing pills; bread got stuck and came up; has hx of esophageal stricture stretched in the past; GI at [facility]; has GERD, and it has been worse  Depression screen [facility] 2/9 01/11/2017 01/04/2017 11/26/2016 10/10/2016 10/04/2016  Decreased Interest 0 0 0 0 0  Down, Depressed, Hopeless 1 1 0 0 0  PHQ - 2 Score 1 1 0 0 0  Altered sleeping - - - - -  Tired, decreased energy - - - - -  Change in appetite - - - - -  Feeling  bad or failure about yourself  - - - - -  Trouble concentrating - - - - -  Moving slowly or fidgety/restless - - - - -  Suicidal thoughts - - - - -  PHQ-9 Score - - - - -  Difficult doing work/chores - - - - -  Some recent data might be hidden    Relevant past medical, surgical, family and social history reviewed Past Medical History:  Diagnosis Date  . Anxiety and depression   . Breast cancer ([place]) 2015   LT LUMPECTOMY  . Carpal tunnel syndrome    . Chest pain    SECONDARY TO GERD  . Chronic interstitial cystitis   . Chronic right hip pain   . Depression   . Diabetes mellitus without complication ([place])   . Diverticulosis   . Dyslipidemia   . Essential hypertension, benign   . Fibromyalgia   . Frequent falls   . GERD (gastroesophageal reflux disease)   . Hearing loss of left ear   . History of fractured rib   . History of TIA (transient ischemic attack)   . Irritable bowel   . Lumbar arthropathy ([place]) 10/04/2016   On CT scan, refer to ortho  . MS (multiple sclerosis) ([place])   . Osteoarthritis   . Paroxysmal supraventricular tachycardia ([place])   . Peripheral neuropathy    MILD  . Personal history of radiation therapy 2015   BREAST CA  . Protein malnutrition ([place])   . Pure hypercholesterolemia   . Recurrent UTI   . Skin cancer   . SOB (shortness of breath)    SECONDARY TO REFLUX  . Solitary pulmonary nodule on lung CT 12/04/2015   3 mm LLL lung nodule June 2016, March 2017  . Tachycardia, paroxysmal (H[facility])    Reportedly Paroxysmal Supraventricular Tachycardia, but unable to find documentation confirming this  . Thickening of esophagus 01/11/2017   Noted on CT scan June 2018  . Thyroid nodule   . Vertigo   . Vitamin B 12 deficiency    Past Surgical History:  Procedure Laterality Date  . APPENDECTOMY    . AUGMENTATION MAMMAPLASTY Right 1975   BREAST BREAST ONLY  . BLADDER TACKING     X 3  . BREAST LUMPECTOMY    . BREAST SURGERY    . CARDIAC CATHETERIZATION  10/2001   Normal coronary arteries with the exception of 20% proximal D1  . CHOLECYSTECTOMY    . COLONOSCOPY WITH PROPOFOL N/A 01/21/2015   Procedure: COLONOSCOPY WITH PROPOFOL;  Surgeon: Josefine Class, MD;  Location: [facility] ENDOSCOPY;  Service: Endoscopy;  Laterality: N/A;  . ESOPHAGOGASTRODUODENOSCOPY N/A 01/21/2015   Procedure: ESOPHAGOGASTRODUODENOSCOPY (EGD);  Surgeon: Josefine Class, MD;  Location: [facility] ENDOSCOPY;  Service: Endoscopy;  Laterality:  N/A;  . EYE SURGERIES     WITH BUCKLE DETACHMENT OF THE RETINA  . heart monitor     Not being used... Patient can not have a MRI  . HEMORRHOID SURGERY     WITH RECONSTRUCTION  . MASTECTOMY Right 1970   SUBCUTANEOUS - NO BREAST CANCER  . TONSILLECTOMY    . TOTAL ABDOMINAL HYSTERECTOMY W/ BILATERAL SALPINGOOPHORECTOMY    . TYMPANOPLASTY     Family History  Problem Relation Age of Onset  . ALS Mother   . Kidney disease Sister   . Arthritis Sister   . Aneurysm Brother   . Heart disease Daughter   . Colon cancer Unknown   . Breast cancer Neg Hx   .  Bladder Cancer Neg Hx   . Prostate cancer Neg Hx    Social History   Social History  . Marital status: Married    Spouse name: Marcello Moores  . Number of children: 4  . Years of education: N/A   Occupational History  . 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 Smoking status: Never Smoker  . Smokeless tobacco: Never Used  . Alcohol use No  . Drug use: No  . Sexual activity: No   Other Topics Concern  . Not on file   Social History Narrative   1 SON, 3 DAUGHTER'S   15 GRANDCHILDREN   LIVING AT [facility]    Interim medical history since last visit reviewed. Allergies and medications reviewed  Review of Systems Per HPI unless specifically indicated above     Objective:    BP 108/64 (BP Location: Left Arm, Patient Position: Sitting, Cuff Size: Normal)   Pulse 83   Temp 98.3 F (36.8 C) (Oral)   Resp 16   Wt 140 lb 6.4 oz (63.7 kg)   SpO2 95%   BMI 24.87 kg/m   Wt Readings from Last 3 Encounters:  01/15/17 147 lb 4.8 oz (66.8 kg)  01/11/17 140 lb 6.4 oz (63.7 kg)  01/09/17 150 lb (68 kg)    Physical Exam  Constitutional: She appears well-developed and well-nourished. No distress.  HENT:  Head: Normocephalic and atraumatic.  Eyes: EOM are normal. No scleral icterus.  Neck: No thyromegaly present.  Cardiovascular: Normal rate, regular rhythm and normal heart sounds.     No murmur heard. Pulmonary/Chest: Effort normal and breath sounds normal. No respiratory distress. She has no wheezes.  Abdominal: Soft. Bowel sounds are normal. She exhibits no distension.  Musculoskeletal: Normal range of motion. She exhibits no edema.  Neurological: She is alert. She exhibits normal muscle tone.  Skin: Skin is warm and dry. She is not diaphoretic. No pallor.  Psychiatric: She has a normal mood and affect. Her behavior is normal. Judgment and thought content normal.    Results for orders placed or performed in visit on 01/11/17  TSH  Result Value Ref Range   TSH 2.81 mIU/L  Basic Metabolic Panel (BMET)  Result Value Ref Range   Sodium 142 135 - 146 mmol/L   Potassium 5.2 3.5 - 5.3 mmol/L   Chloride 106 98 - 110 mmol/L   CO2 20 20 - 31 mmol/L   Glucose, Bld 185 (H) 65 - 99 mg/dL   BUN 27 (H) 7 - 25 mg/dL   Creat 1.11 (H) 0.60 - 0.88 mg/dL   Calcium 9.7 8.6 - 10.4 mg/dL  Lipid panel  Result Value Ref Range   Cholesterol 203 (H) <200 mg/dL   Triglycerides 110 <150 mg/dL   HDL 84 >50 mg/dL   Total CHOL/HDL Ratio 2.4 <5.0 Ratio   VLDL 22 <30 mg/dL   LDL Cholesterol 97 <100 mg/dL      Assessment & Plan:   Problem List Items Addressed This Visit      Digestive   Thickening of esophagus - Primary   Relevant Orders   Ambulatory referral to Gastroenterology   Gastro-esophageal reflux disease without esophagitis    Refer back to GI; continue PPI      Relevant Medications   omeprazole (PRILOSEC) 20 MG capsule   Other Relevant Orders   Ambulatory referral to Gastroenterology     Endocrine   Thyroid nodule  Followed by endocrinologist; check TSH      Relevant Orders   TSH (Completed)   DM (diabetes mellitus) type II controlled, neurological manifestation ([place])    Controlled; hold metformin until kidney issue resolved        Genitourinary   Recurrent UTI    See urologist on Monday; start back on daily prophylaxis        Other    Hypercholesteremia    Due to have lipids rechecked, will draw today       Other Visit Diagnoses    Renal insufficiency       check BMP today; hold metformin until GFR perks up   Relevant Orders   Basic Metabolic Panel (BMET) (Completed)   Kidney stones       managed by urologist       Follow up plan: No Follow-up on file.  An after-visit summary was printed and given to the patient at [place].  Please see the patient instructions which may contain other information and recommendations beyond what is mentioned above in the assessment and plan.  Meds ordered this encounter  Medications  . methylPREDNISolone (MEDROL DOSEPAK) 4 MG TBPK tablet    Sig: TAKE 6 TABLETS ON DAY 1 AS DIRECTED ON PACKAGE AND DECREASE BY 1 TAB EACH DAY FOR A TOTAL OF 6 DAYS    Refill:  0  . omeprazole (PRILOSEC) 20 MG capsule    Sig: Take 1 capsule (20 mg total) by mouth daily.    Dispense:  30 capsule    Refill:  1    Orders Placed This Encounter  Procedures  . TSH  . Basic Metabolic Panel (BMET)  . Ambulatory referral to Gastroenterology

## 2017-01-11 NOTE — Telephone Encounter (Signed)
Pt has appt at 2:00 pm today. She asked if she can get it checked today. I metion to her that we surely can and we will see her later today.

## 2017-01-12 LAB — BASIC METABOLIC PANEL
BUN: 27 mg/dL — ABNORMAL HIGH (ref 7–25)
CHLORIDE: 106 mmol/L (ref 98–110)
CO2: 20 mmol/L (ref 20–31)
Calcium: 9.7 mg/dL (ref 8.6–10.4)
Creat: 1.11 mg/dL — ABNORMAL HIGH (ref 0.60–0.88)
GLUCOSE: 185 mg/dL — AB (ref 65–99)
POTASSIUM: 5.2 mmol/L (ref 3.5–5.3)
SODIUM: 142 mmol/L (ref 135–146)

## 2017-01-12 LAB — LIPID PANEL
CHOL/HDL RATIO: 2.4 ratio (ref <5.0)
CHOLESTEROL: 203 mg/dL — AB (ref <200)
HDL: 84 mg/dL (ref >50)
LDL Cholesterol: 97 mg/dL (ref <100)
Triglycerides: 110 mg/dL (ref <150)
VLDL: 22 mg/dL (ref <30)

## 2017-01-14 ENCOUNTER — Ambulatory Visit: Payer: Medicare HMO | Admitting: Urology

## 2017-01-15 ENCOUNTER — Ambulatory Visit: Payer: Medicare HMO | Admitting: Urology

## 2017-01-15 ENCOUNTER — Encounter: Payer: Self-pay | Admitting: Urology

## 2017-01-15 VITALS — BP 115/68 | HR 85 | Ht 59.0 in | Wt 147.3 lb

## 2017-01-15 DIAGNOSIS — N201 Calculus of ureter: Secondary | ICD-10-CM | POA: Diagnosis not present

## 2017-01-15 DIAGNOSIS — N132 Hydronephrosis with renal and ureteral calculous obstruction: Secondary | ICD-10-CM

## 2017-01-15 LAB — URINALYSIS, COMPLETE
Bilirubin, UA: NEGATIVE
GLUCOSE, UA: NEGATIVE
Leukocytes, UA: NEGATIVE
NITRITE UA: NEGATIVE
PROTEIN UA: NEGATIVE
RBC, UA: NEGATIVE
SPEC GRAV UA: 1.025 (ref 1.005–1.030)
UUROB: 0.2 mg/dL (ref 0.2–1.0)
pH, UA: 5 (ref 5.0–7.5)

## 2017-01-15 LAB — MICROSCOPIC EXAMINATION: RBC MICROSCOPIC, UA: NONE SEEN /HPF (ref 0–?)

## 2017-01-15 MED ORDER — ALFUZOSIN HCL ER 10 MG PO TB24: 10.0000 mg | ORAL_TABLET | Freq: Every day | ORAL | 0 refills | Status: DC

## 2017-01-15 NOTE — Progress Notes (Signed)
01/15/2017 9:48 AM   [name] May 10, 1930 [id number]  Referring provider: Arnetha Courser, MD [address] [place], [place] [address]  Chief Complaint  Patient presents with  . Follow-up    ER  for kidney stone    HPI: Patient well-known to wire practice with history of urethral diverticulum, incontinence, recurrent UTI and interstitial cystitis who presented with a flare of pain specifically in the right lower quadrant. A CT scan of the abdomen and pelvis was obtained. This showed moderate right hydroureteronephrosis down to a 1-2 mm right UVJ stone that was just about in the bladder. She had the appearance of bilateral extrarenal pelves. Her UA showed 6-30 red cells, rare bacteria. Creatinine was 1.18. This was rechecked and found to be stable and slightly lower at 1.11. She was given percocet +/- ibuprofen. She hasnt seen a stone pass and still has RLQ pain. She has never had kidney stones. The urethral tic looked stable. She is staying hydrated with "water". Hasn't needed pain meds. She also has urgency, UUI and dysuria, some of which is chronic.  Her UA today shows rare bacteria. No rbc's.    PMH: Past Medical History:  Diagnosis Date  . Anxiety and depression   . Breast cancer ([place]) 2015   LT LUMPECTOMY  . Carpal tunnel syndrome   . Chest pain    SECONDARY TO GERD  . Chronic interstitial cystitis   . Chronic right hip pain   . Depression   . Diabetes mellitus without complication ([place])   . Diverticulosis   . Dyslipidemia   . Essential hypertension, benign   . Fibromyalgia   . Frequent falls   . GERD (gastroesophageal reflux disease)   . Hearing loss of left ear   . History of fractured rib   . History of TIA (transient ischemic attack)   . Irritable bowel   . Lumbar arthropathy ([place]) 10/04/2016   On CT scan, refer to ortho  . MS (multiple sclerosis) ([place])   . Osteoarthritis   . Paroxysmal supraventricular tachycardia ([place])   . Peripheral  neuropathy    MILD  . Personal history of radiation therapy 2015   BREAST CA  . Protein malnutrition ([place])   . Pure hypercholesterolemia   . Recurrent UTI   . Skin cancer   . SOB (shortness of breath)    SECONDARY TO REFLUX  . Solitary pulmonary nodule on lung CT 12/04/2015   3 mm LLL lung nodule June 2016, March 2017  . Tachycardia, paroxysmal (H[facility])    Reportedly Paroxysmal Supraventricular Tachycardia, but unable to find documentation confirming this  . Thickening of esophagus 01/11/2017   Noted on CT scan June 2018  . Thyroid nodule   . Vertigo   . Vitamin B 12 deficiency     Surgical History: Past Surgical History:  Procedure Laterality Date  . APPENDECTOMY    . AUGMENTATION MAMMAPLASTY Right 1975   BREAST BREAST ONLY  . BLADDER TACKING     X 3  . BREAST LUMPECTOMY    . BREAST SURGERY    . CARDIAC CATHETERIZATION  10/2001   Normal coronary arteries with the exception of 20% proximal D1  . CHOLECYSTECTOMY    . COLONOSCOPY WITH PROPOFOL N/A 01/21/2015   Procedure: COLONOSCOPY WITH PROPOFOL;  Surgeon: Josefine Class, MD;  Location: [facility] ENDOSCOPY;  Service: Endoscopy;  Laterality: N/A;  . ESOPHAGOGASTRODUODENOSCOPY N/A 01/21/2015   Procedure: ESOPHAGOGASTRODUODENOSCOPY (EGD);  Surgeon: Josefine Class, MD;  Location: [facility]  ENDOSCOPY;  Service: Endoscopy;  Laterality: N/A;  . EYE SURGERIES     WITH BUCKLE DETACHMENT OF THE RETINA  . heart monitor     Not being used... Patient can not have a MRI  . HEMORRHOID SURGERY     WITH RECONSTRUCTION  . MASTECTOMY Right 1970   SUBCUTANEOUS - NO BREAST CANCER  . TONSILLECTOMY    . TOTAL ABDOMINAL HYSTERECTOMY W/ BILATERAL SALPINGOOPHORECTOMY    . TYMPANOPLASTY      Home Medications:  Allergies as of 01/15/2017      Reactions   Aricept [donepezil Hcl] Other (See Comments)   hallucinations   Biaxin [clarithromycin] Other (See Comments), Diarrhea   Copaxone [glatiramer Acetate]    Decadron [dexamethasone] Nausea And  Vomiting, Other (See Comments)   Other reaction(s): Muscle Pain Reaction:  Abdominal pain   Dexamethasone Sodium Phosphate Other (See Comments)   Diltiazem Hcl Other (See Comments)   Reaction:  Unknown    Diltiazem Hcl Other (See Comments)   Flagyl [metronidazole] Nausea Only, Diarrhea   Other reaction(s): Vomiting   Gabapentin Other (See Comments)   "burning all over" Reaction:  Unknown    Iohexol Swelling, Other (See Comments)    Desc: tongue swelling with "IVP dye" sccording to nurses notes with a lumbar myelo-12/09- asm, Onset Date: [id number]  Pts tongue swells.   Ivp Dye [iodinated Diagnostic Agents] Swelling, Other (See Comments)   Passed out  Pts tongue swells.    Levothyroxine Nausea Only   Macrobid [nitrofurantoin Macrocrystal] Nausea And Vomiting   Sulfa Antibiotics Other (See Comments)   Reaction:  Unknown    Sulfonamide Derivatives Other (See Comments)   Reaction:  Unknown    Synthroid [levothyroxine Sodium]    Copaxone  [glatiramer] Rash   Interferon Beta-1a Other (See Comments), Rash   Reaction:  Unknown       Medication List       Accurate as of 01/15/17  9:48 AM. Always use your most recent med list.          acetaminophen 500 MG tablet Commonly known as:  TYLENOL Take by mouth.   amLODipine 2.5 MG tablet Commonly known as:  NORVASC TAKE 1 TABLET (2.5 MG TOTAL) BY MOUTH DAILY.   aspirin EC 81 MG tablet Take 1 tablet (81 mg total) by mouth daily.   atorvastatin 40 MG tablet Commonly known as:  LIPITOR Take 1 tablet (40 mg total) by mouth at bedtime.   b complex vitamins tablet Take 1 tablet by mouth daily.   fluticasone 50 MCG/ACT nasal spray Commonly known as:  FLONASE SPRAY 2 SPRAYS EACH INTO BOTH NOSTRILS ONCE DAILY EACH NIGHT.   GENTEAL OP Apply 1 drop to eye. Each eye as needed   hydrocortisone 1 % Crea Commonly known as:  PROCTO-PAK Apply to rectum twice a day   meclizine 25 MG tablet Commonly known as:  ANTIVERT Take 1 tablet  (25 mg total) by mouth every 8 (eight) hours as needed for dizziness.   memantine 10 MG tablet Commonly known as:  NAMENDA Take 10 mg by mouth 2 (two) times daily.   methylPREDNISolone 4 MG Tbpk tablet Commonly known as:  MEDROL DOSEPAK TAKE 6 TABLETS ON DAY 1 AS DIRECTED ON PACKAGE AND DECREASE BY 1 TAB EACH DAY FOR A TOTAL OF 6 DAYS   omeprazole 20 MG capsule Commonly known as:  PRILOSEC Take 1 capsule (20 mg total) by mouth daily.   ondansetron 4 MG tablet Commonly known as:  ZOFRAN Take  4 mg by mouth every 8 (eight) hours as needed for nausea or vomiting. Reported on 12/22/2015   oxyCODONE-acetaminophen 5-325 MG tablet Commonly known as:  ROXICET Take 1 tablet by mouth every 6 (six) hours as needed.   PROBIOTIC COLON SUPPORT Caps Take 1 capsule by mouth daily.   sertraline 50 MG tablet Commonly known as:  ZOLOFT Take 1 tablet (50 mg total) by mouth daily.   trimethoprim 100 MG tablet Commonly known as:  TRIMPEX Take 100 mg by mouth daily.   Vitamin B-12 1000 MCG Subl       Allergies:  Allergies  Allergen Reactions  . Aricept [Donepezil Hcl] Other (See Comments)    hallucinations  . Biaxin [Clarithromycin] Other (See Comments) and Diarrhea  . Copaxone [Glatiramer Acetate]   . Decadron [Dexamethasone] Nausea And Vomiting and Other (See Comments)    Other reaction(s): Muscle Pain Reaction:  Abdominal pain  . Dexamethasone Sodium Phosphate Other (See Comments)  . Diltiazem Hcl Other (See Comments)    Reaction:  Unknown   . Diltiazem Hcl Other (See Comments)  . Flagyl [Metronidazole] Nausea Only and Diarrhea    Other reaction(s): Vomiting  . Gabapentin Other (See Comments)    "burning all over" Reaction:  Unknown   . Iohexol Swelling and Other (See Comments)     Desc: tongue swelling with "IVP dye" sccording to nurses notes with a lumbar myelo-12/09- asm, Onset Date: [id number]  Pts tongue swells.  Clementeen Hoof [Iodinated Diagnostic Agents] Swelling and Other (See  Comments)    Passed out  Pts tongue swells.   . Levothyroxine Nausea Only  . Macrobid [Nitrofurantoin Macrocrystal] Nausea And Vomiting  . Sulfa Antibiotics Other (See Comments)    Reaction:  Unknown   . Sulfonamide Derivatives Other (See Comments)    Reaction:  Unknown   . Synthroid [Levothyroxine Sodium]   . Copaxone  [Glatiramer] Rash  . Interferon Beta-1a Other (See Comments) and Rash    Reaction:  Unknown     Family History: Family History  Problem Relation Age of Onset  . ALS Mother   . Kidney disease Sister   . Arthritis Sister   . Aneurysm Brother   . Heart disease Daughter   . Colon cancer Unknown   . Breast cancer Neg Hx   . Bladder Cancer Neg Hx   . Prostate cancer Neg Hx     Social History:  reports that she has never smoked. She has never used smokeless tobacco. She reports that she does not drink alcohol or use drugs.  ROS: UROLOGY Frequent Urination?: No Hard to postpone urination?: Yes Burning/pain with urination?: Yes Get up at night to urinate?: Yes Leakage of urine?: No Urine stream starts and stops?: No Trouble starting stream?: No Do you have to strain to urinate?: No Blood in urine?: No Urinary tract infection?: No Sexually transmitted disease?: No Injury to kidneys or bladder?: No Painful intercourse?: No Weak stream?: No Currently pregnant?: No Vaginal bleeding?: No Last menstrual period?: n  Gastrointestinal Nausea?: No Vomiting?: No Indigestion/heartburn?: No Diarrhea?: No Constipation?: No  Constitutional Fever: No Night sweats?: No Weight loss?: No Fatigue?: No  Skin Skin rash/lesions?: No Itching?: No  Eyes Blurred vision?: Yes Double vision?: Yes  Ears/Nose/Throat Sore throat?: No Sinus problems?: Yes  Hematologic/Lymphatic Swollen glands?: No Easy bruising?: No  Cardiovascular Leg swelling?: No Chest pain?: No  Respiratory Cough?: No Shortness of breath?: No  Endocrine Excessive thirst?:  Yes  Musculoskeletal Back pain?: Yes Joint pain?: Yes  Neurological Headaches?: Yes Dizziness?: Yes  Psychologic Depression?: Yes Anxiety?: Yes  Physical Exam: BP 115/68   Pulse 85   Ht 4' 11"  (1.499 m)   Wt 66.8 kg (147 lb 4.8 oz)   BMI 29.75 kg/m   Constitutional:  Alert and oriented, No acute distress. She's talking and laughing. HEENT: [place] AT, moist mucus membranes.  Trachea midline, no masses. Cardiovascular: No clubbing, cyanosis, or edema. Respiratory: Normal respiratory effort, no increased work of breathing. GI: Abdomen is soft, nontender, nondistended, no abdominal masses GU: No CVA tenderness. Skin: No rashes, bruises or suspicious lesions. Neurologic: Grossly intact, no focal deficits, moving all 4 extremities. Psychiatric: Normal mood and affect.  Laboratory Data: Lab Results  Component Value Date   WBC 8.5 01/09/2017   HGB 13.4 01/09/2017   HCT 40.5 01/09/2017   MCV 94.0 01/09/2017   PLT 203 01/09/2017    Lab Results  Component Value Date   CREATININE 1.11 (H) 01/11/2017    No results found for: PSA  No results found for: TESTOSTERONE  Lab Results  Component Value Date   HGBA1C 7.0 (H) 01/04/2017    Urinalysis    Component Value Date/Time   COLORURINE AMBER (A) 01/09/2017 1113   APPEARANCEUR HAZY (A) 01/09/2017 1113   APPEARANCEUR Cloudy (A) 10/02/2016 1132   LABSPEC 1.019 01/09/2017 1113   LABSPEC 1.021 11/12/2014 1650   PHURINE 5.0 01/09/2017 1113   GLUCOSEU NEGATIVE 01/09/2017 1113   GLUCOSEU Negative 11/12/2014 1650   GLUCOSEU NEGATIVE 06/26/2010 1442   HGBUR MODERATE (A) 01/09/2017 1113   BILIRUBINUR NEGATIVE 01/09/2017 1113   BILIRUBINUR Negative 10/02/2016 1132   BILIRUBINUR Negative 11/12/2014 1650   KETONESUR NEGATIVE 01/09/2017 1113   PROTEINUR NEGATIVE 01/09/2017 1113   UROBILINOGEN 0.2 06/26/2010 1442   NITRITE NEGATIVE 01/09/2017 1113   LEUKOCYTESUR NEGATIVE 01/09/2017 1113   LEUKOCYTESUR 1+ (A) 10/02/2016 1132    LEUKOCYTESUR Negative 11/12/2014 1650    Pertinent Imaging: CT's  Assessment & Plan:    1) right ureteral stone - just about to pass on CT, Check renal US - KUB might not be effective given the small size and multiple pelvic calcifications. Start alfuzosin. UA looked good, resolution of MH. Urine sent for Cx. Clinically she does not look infected.   There are no diagnoses linked to this encounter.  No Follow-up on file.  Festus Aloe, [facility] [address], [place], [place] [address] [phone]

## 2017-01-17 ENCOUNTER — Encounter: Payer: Commercial Managed Care - HMO | Admitting: Physical Therapy

## 2017-01-17 ENCOUNTER — Other Ambulatory Visit: Payer: Self-pay | Admitting: Urology

## 2017-01-18 ENCOUNTER — Ambulatory Visit
Admission: RE | Admit: 2017-01-18 | Discharge: 2017-01-18 | Disposition: A | Discharge status: Home / Self Care | Payer: Medicare HMO | Source: Ambulatory Visit | Attending: Urology | Admitting: Urology

## 2017-01-18 DIAGNOSIS — N201 Calculus of ureter: Secondary | ICD-10-CM

## 2017-01-18 DIAGNOSIS — N132 Hydronephrosis with renal and ureteral calculous obstruction: Secondary | ICD-10-CM

## 2017-01-18 DIAGNOSIS — N281 Cyst of kidney, acquired: Secondary | ICD-10-CM | POA: Insufficient documentation

## 2017-01-18 DIAGNOSIS — N1330 Unspecified hydronephrosis: Secondary | ICD-10-CM | POA: Diagnosis not present

## 2017-01-18 LAB — CULTURE, URINE COMPREHENSIVE

## 2017-01-21 NOTE — Telephone Encounter (Signed)
Yes, she should continue TMP - that's for chronic suppression due to urethral diverticulum. I just saw her for a small stone.

## 2017-01-22 ENCOUNTER — Telehealth: Payer: Self-pay

## 2017-01-22 NOTE — Telephone Encounter (Signed)
-----   Message from Festus Aloe, MD sent at 01/21/2017 10:26 PM EDT ----- Notify patient her renal US was normal - kidney blockage resolved because she likely passed her stone   ----- Message ----- From: Royanne Foots, CMA Sent: 01/18/2017  11:27 AM To: Festus Aloe, MD    ----- Message ----- From: Interface, Rad Results In Sent: 01/18/2017  11:24 AM To: Rowe Robert Clinical

## 2017-01-22 NOTE — Telephone Encounter (Signed)
Patient notified of results.

## 2017-01-22 NOTE — Telephone Encounter (Signed)
-----   Message from Festus Aloe, MD sent at 01/21/2017  5:03 PM EDT ----- notift patient her urine culture was positive but grew a low count. She doesn't need further antibiotics if she does not have bladder pain or dysuria (symptoms of a UTI).  She can continue 100 nightly trimethoprim.   ----- Message ----- From: Royanne Foots, CMA Sent: 01/18/2017  11:49 AM To: Festus Aloe, MD    ----- Message ----- From: Interface, [facility] Lab Results In Sent: 01/15/2017   4:41 PM To: Rowe Robert Clinical

## 2017-01-25 ENCOUNTER — Encounter: Payer: Commercial Managed Care - HMO | Admitting: Physical Therapy

## 2017-01-31 ENCOUNTER — Ambulatory Visit: Payer: Medicare HMO | Admitting: Physical Therapy

## 2017-01-31 DIAGNOSIS — R52 Pain, unspecified: Secondary | ICD-10-CM

## 2017-01-31 DIAGNOSIS — M6281 Muscle weakness (generalized): Secondary | ICD-10-CM | POA: Diagnosis not present

## 2017-01-31 DIAGNOSIS — M791 Myalgia, unspecified site: Secondary | ICD-10-CM

## 2017-01-31 DIAGNOSIS — R296 Repeated falls: Secondary | ICD-10-CM | POA: Diagnosis not present

## 2017-01-31 DIAGNOSIS — R2681 Unsteadiness on feet: Secondary | ICD-10-CM

## 2017-01-31 DIAGNOSIS — M533 Sacrococcygeal disorders, not elsewhere classified: Secondary | ICD-10-CM | POA: Diagnosis not present

## 2017-01-31 DIAGNOSIS — M545 Low back pain, unspecified: Secondary | ICD-10-CM

## 2017-01-31 DIAGNOSIS — G8929 Other chronic pain: Secondary | ICD-10-CM | POA: Diagnosis not present

## 2017-01-31 DIAGNOSIS — R2689 Other abnormalities of gait and mobility: Secondary | ICD-10-CM | POA: Diagnosis not present

## 2017-01-31 NOTE — Patient Instructions (Addendum)
To relax and decrease midback muscles and spine    Seated on bed, hands by the hips, thumbs pointed outward Shoulder squeezes  10 reps x 3      Lay a pillow turned long ways, one horizontal underneath the top half. One more pillow under head if need  Allow your shoulders to release over the pillow and palms open up to the sky  Rest here for 5- 10 mins

## 2017-02-01 DIAGNOSIS — M7061 Trochanteric bursitis, right hip: Secondary | ICD-10-CM | POA: Diagnosis not present

## 2017-02-01 DIAGNOSIS — M5136 Other intervertebral disc degeneration, lumbar region: Secondary | ICD-10-CM | POA: Diagnosis not present

## 2017-02-01 NOTE — Therapy (Signed)
[place] MAIN [facility] SERVICES [address] [place], [place], [address] Phone: [phone]   Fax:  [phone]  Physical Therapy Treatment  Patient Details  Name: [name] MRN: [id number] Date of Birth: 1929-12-06 Referring Provider: Ernestine Conrad  Encounter Date: 01/31/2017      PT End of Session - 02/01/17 1200    Visit Number 6   Number of Visits 12   Date for PT Re-Evaluation 02/15/17   Authorization Type 6 g codes   PT Start Time 2263   PT Stop Time 3354   PT Time Calculation (min) 55 min   Activity Tolerance Patient tolerated treatment well;No increased pain   Behavior During Therapy WFL for tasks assessed/performed      Past Medical History:  Diagnosis Date  . Anxiety and depression   . Breast cancer ([place]) 2015   LT LUMPECTOMY  . Carpal tunnel syndrome   . Chest pain    SECONDARY TO GERD  . Chronic interstitial cystitis   . Chronic right hip pain   . Depression   . Diabetes mellitus without complication ([place])   . Diverticulosis   . Dyslipidemia   . Essential hypertension, benign   . Fibromyalgia   . Frequent falls   . GERD (gastroesophageal reflux disease)   . Hearing loss of left ear   . History of fractured rib   . History of TIA (transient ischemic attack)   . Irritable bowel   . Lumbar arthropathy ([place]) 10/04/2016   On CT scan, refer to ortho  . MS (multiple sclerosis) ([place])   . Osteoarthritis   . Paroxysmal supraventricular tachycardia ([place])   . Peripheral neuropathy    MILD  . Personal history of radiation therapy 2015   BREAST CA  . Protein malnutrition ([place])   . Pure hypercholesterolemia   . Recurrent UTI   . Skin cancer   . SOB (shortness of breath)    SECONDARY TO REFLUX  . Solitary pulmonary nodule on lung CT 12/04/2015   3 mm LLL lung nodule June 2016, March 2017  . Tachycardia, paroxysmal ([facility])    Reportedly Paroxysmal Supraventricular Tachycardia, but unable to find documentation confirming  this  . Thickening of esophagus 01/11/2017   Noted on CT scan June 2018  . Thyroid nodule   . Vertigo   . Vitamin B 12 deficiency     Past Surgical History:  Procedure Laterality Date  . APPENDECTOMY    . AUGMENTATION MAMMAPLASTY Right 1975   BREAST BREAST ONLY  . BLADDER TACKING     X 3  . BREAST LUMPECTOMY    . BREAST SURGERY    . CARDIAC CATHETERIZATION  10/2001   Normal coronary arteries with the exception of 20% proximal D1  . CHOLECYSTECTOMY    . COLONOSCOPY WITH PROPOFOL N/A 01/21/2015   Procedure: COLONOSCOPY WITH PROPOFOL;  Surgeon: Josefine Class, MD;  Location: [facility] ENDOSCOPY;  Service: Endoscopy;  Laterality: N/A;  . ESOPHAGOGASTRODUODENOSCOPY N/A 01/21/2015   Procedure: ESOPHAGOGASTRODUODENOSCOPY (EGD);  Surgeon: Josefine Class, MD;  Location: [facility] ENDOSCOPY;  Service: Endoscopy;  Laterality: N/A;  . EYE SURGERIES     WITH BUCKLE DETACHMENT OF THE RETINA  . heart monitor     Not being used... Patient can not have a MRI  . HEMORRHOID SURGERY     WITH RECONSTRUCTION  . MASTECTOMY Right 1970   SUBCUTANEOUS - NO BREAST CANCER  . TONSILLECTOMY    . TOTAL ABDOMINAL HYSTERECTOMY W/ BILATERAL SALPINGOOPHORECTOMY    .  TYMPANOPLASTY      There were no vitals filed for this visit.      Subjective Assessment - 01/31/17 1449    Subjective Pt reported she went on her family trip in a car to [place] and came back with LBP that lasted for 2 days. Pt put on a brace and could not walk. Today is the 4th day since she has been back and she is doing better. The pain is not sharp like it was yesterday.  Pt's rectal pain has returned probably from sitting for a long time. Pt reports she has abdominal scars from 3 surgeries, one to remove her gall bladder, hysterectomy, and has had pelvic organ prolapse surgeries. Pt had kidney stones causing severe back pain that wrapped around her side prior to her trip. Pt visited ER and an Xray showed pt had a kidney stone which passed. Pt has an  appt with her urologist.     Pertinent History Arthritis, neuropathy, MS, Breast CA, lumbar surgery    Patient Stated Goals not hurt when she steps on her leg            [facility] PT Assessment - 02/01/17 1200      Palpation   Spinal mobility increased R thoracic mm tensions   Palpation comment abdominal scar restricted  ( horizontal and longitidinal ) LQ, longitinal scar restricted at low back                       [facility] Adult PT Treatment/Exercise - 02/01/17 1200      Exercises   Exercises --  see pt instructions     Manual Therapy   Manual therapy comments  STM along T10-12 on R, gentle abdominal scar massage                      PT Long Term Goals - 01/31/17 1531      PT LONG TERM GOAL #1   Title Pt will demo no pelvic obliquities across 2 visits in order to walk without pain   Time 12   Period Weeks   Status Achieved     PT LONG TERM GOAL #2   Title Pt will increase her gait speed from 0.48 m/s to > 1.0 m/s with rollator in order to minimal fall risk. ( 6/4: 0.64ms)    Time 12   Period Weeks   Status Achieved     PT LONG TERM GOAL #3   Title Pt will decrease her ODI score from  % to <   % in order to return to ADLs   Time 12   Period Weeks   Status Deferred     PT LONG TERM GOAL #4   Title Pt will demo increased hip abduction strength from 3-/5 B to 4/5 in order to progress to dynamic stability training to ambulate int he community safely    Time 12   Period Weeks   Status Partially Met     PT LONG TERM GOAL #5   Title Pt will report no rectal pain when stepping on her foot with walking, bowel movements in order to improve QOL   Time 12   Period Weeks   Status Achieved     Additional Long Term Goals   Additional Long Term Goals Yes     PT LONG TERM GOAL #6   Title Pt will report no rectal pain with palpation to SIJ across 2 visits in  order to maintain SIJ alignment and minimize pain   Time 12   Period Weeks   Status  On-going     PT LONG TERM GOAL #7   Title Pt will demo decreased scar restrictions along abdominal and lumbar scars in order to promote improved rectal pain, motility, and pelvic floor mobility.     Time 12   Period Weeks   Status New               Plan - 01/31/17 1531    Clinical Impression Statement Pt continues to show good carry over with no pelvic obliquities and less slumped posture. Pt was able to self-manage after a relapse of spinal pain after a long car ride with improvement after 2 days of pain. Addressed decreasing abdominal scar restrictions today in addition increasing thoracic extension. Pt reported her abdominal area felt better and rectal pain resolved following Tx.  Plan to continue addressing abdominal scars and lumbar scar restrictions and spinal deficits to address Sx related IC and rectal pain.  Pt continues to benefit from skilled PT   Rehab Potential Good   Clinical Impairments Affecting Rehab Potential Hx of falls, hysterectomy, arthritis, neuropathy, MS, Breast CA, lumbar surgery, uses a rollator    PT Frequency 1x / week   PT Treatment/Interventions ADLs/Self Care Home Management;Aquatic Therapy;Electrical Stimulation;Neuromuscular re-education;Moist Heat;Balance training;Therapeutic exercise;Therapeutic activities;Functional mobility training;Stair training;Gait training;Patient/family education;Manual techniques;Manual lymph drainage;Scar mobilization;Taping   Consulted and Agree with Plan of Care Patient      Patient will benefit from skilled therapeutic intervention in order to improve the following deficits and impairments:  Abnormal gait, Pain, Increased muscle spasms, Decreased range of motion, Decreased safety awareness, Postural dysfunction, Improper body mechanics, Decreased strength, Decreased activity tolerance, Decreased balance, Decreased mobility, Decreased scar mobility, Decreased endurance, Impaired sensation, Decreased skin integrity, Decreased  coordination, Difficulty walking, Increased fascial restricitons, Hypomobility  Visit Diagnosis: Muscle weakness (generalized)  Sacrococcygeal disorders, not elsewhere classified  Falls frequently  Chronic bilateral low back pain without sciatica  Myalgia  Unsteadiness on feet  Pain aggravated by walking  Bilateral low back pain without sciatica, unspecified chronicity     Problem List Patient Active Problem List   Diagnosis Date Noted  . Thickening of esophagus 01/11/2017  . Lumbar arthropathy ([place]) 10/04/2016  . Vertigo, aural, left 07/31/2016  . Rectal pain 06/25/2016  . External hemorrhoid 06/18/2016  . Head injury due to trauma 05/09/2016  . Neoplasm of uncertain behavior of skin 05/09/2016  . Hemorrhoids 04/24/2016  . Dementia 04/24/2016  . Medication monitoring encounter 04/03/2016  . Gait abnormality 04/03/2016  . Skin lump of leg 04/03/2016  . Pain in right knee 02/15/2016  . Pain and swelling of right knee 02/13/2016  . Right knee sprain 02/13/2016  . Solitary pulmonary nodule on lung CT 12/04/2015  . Generalized degenerative joint disease of hand 11/21/2015  . Triggering of digit 11/21/2015  . Snapping thumb syndrome 11/21/2015  . Pain, joint, hand 11/11/2015  . Coronary artery calcification seen on CAT scan 11/11/2015  . Multiple sclerosis ([place]) 11/11/2015  . Chronic pain of multiple joints 09/02/2015  . Right lower quadrant abdominal pain 08/02/2015  . Microscopic hematuria 07/12/2015  . Recurrent UTI 04/09/2015  . Carpal tunnel syndrome 01/12/2014  . DD (diverticular disease) 01/12/2014  . Ductal carcinoma in situ of left breast 01/12/2014  . Hypercholesteremia 01/12/2014  . DS (disseminated sclerosis) ([place]) 01/12/2014  . Arthritis, degenerative 01/12/2014  . Thyroid nodule 01/12/2014  . Cyanocobalamine deficiency (non anemic)  01/12/2014  . Anxiety and depression 01/12/2014  . Intraductal carcinoma of breast 01/12/2014  . History of right  mastectomy 01/12/2014  . DM (diabetes mellitus) type II controlled, neurological manifestation ([place]) 06/26/2010  . Depression, major, recurrent, in partial remission ([place]) 06/26/2010  . Frequent falls 06/26/2010  . Stricture and stenosis of esophagus 06/26/2010  . Gastro-esophageal reflux disease without esophagitis 06/26/2010  . Fibromyalgia 06/26/2010  . Chronic interstitial cystitis 06/26/2010  . H/O malignant neoplasm of breast 06/26/2010  . Hypertension goal BP (blood pressure) < 140/90 06/01/2009  . H/O paroxysmal supraventricular tachycardia 06/01/2009  . Paroxysmal supraventricular tachycardia ([place]) 06/01/2009  . Irritable bowel syndrome with constipation and diarrhea 06/16/2008  . Diarrhea, functional 06/16/2008  . Diarrhea, unspecified 06/16/2008    Jerl Mina ,PT, DPT, E-RYT  02/01/2017, 12:04 PM  [place] MAIN [facility] SERVICES [address] [place], [place], [address] Phone: [phone]   Fax:  [phone]  Name: JENAI SCALETTA MRN: [id number] Date of Birth: Aug 14, 1929

## 2017-02-03 NOTE — Progress Notes (Signed)
02/04/2017 9:38 PM   [name] July 09, 1930 [id number]  Referring provider: Arnetha Courser, MD [address] [place], [place] [address]  Chief Complaint  Patient presents with  . Results    RUS results    HPI: 83 yo WF with a history of urethral diverticulum, incontinence, recurrent UTI and IC who was found to have a 2 mm right UVJ stone on CT.  Background history Patient well-known to wire practice with history of urethral diverticulum, incontinence, recurrent UTI and interstitial cystitis who presented with a flare of pain specifically in the right lower quadrant. A CT scan of the abdomen and pelvis was obtained on 01/09/2017.  This showed moderate right hydroureteronephrosis down to a 1-2 mm right UVJ stone that was just about in the bladder. She had the appearance of bilateral extrarenal pelves. Her UA showed 6-30 red cells, rare bacteria. Creatinine was 1.18. This was rechecked and found to be stable and slightly lower at 1.11. She was given percocet +/- ibuprofen. She hasnt seen a stone pass and still has RLQ pain. She has never had kidney stones. The urethral tic looked stable. She is staying hydrated with "water". Hasn't needed pain meds. She also has urgency, UUI and dysuria, some of which is chronic.    RUS completed on 01/18/2017 which noted an interval resolution of right-sided hydronephrosis.  Bilateral renal cysts.  A benign-appearing left splenic cyst measures 3.9 cm maximally, not significantly changed from prior imaging.  Today, she feels she has passed stone. She is experiencing urgency, nocturia and incontinence. These are baseline symptoms. She also suffers with diarrhea and constipation which are also base line.  She denies any fevers, chills, nausea or vomiting.   Her UA today demonstrates moderate bacteria.   This was a cath specimen.   She is complaining of dysuria today.    She is still taking the Trimethoprim and using the vaginal cream three nights  weekly.    She was unable to capture the stone.    PMH: Past Medical History:  Diagnosis Date  . Anxiety and depression   . Breast cancer ([place] [facility]) 2015   LT LUMPECTOMY  . Carpal tunnel syndrome   . Chest pain    SECONDARY TO GERD  . Chronic interstitial cystitis   . Chronic right hip pain   . Depression   . Diabetes mellitus without complication ([place])   . Diverticulosis   . Dyslipidemia   . Essential hypertension, benign   . Fibromyalgia   . Frequent falls   . GERD (gastroesophageal reflux disease)   . Hearing loss of left ear   . History of fractured rib   . History of TIA (transient ischemic attack)   . Irritable bowel   . Lumbar arthropathy ([facility]) 10/04/2016   On CT scan, refer to ortho  . MS (multiple sclerosis) ([facility])   . Osteoarthritis   . Paroxysmal supraventricular tachycardia ([facility])   . Peripheral neuropathy    MILD  . Personal history of radiation therapy 2015   BREAST CA  . Protein malnutrition ([place])   . Pure hypercholesterolemia   . Recurrent UTI   . Skin cancer   . SOB (shortness of breath)    SECONDARY TO REFLUX  . Solitary pulmonary nodule on lung CT 12/04/2015   3 mm LLL lung nodule June 2016, March 2017  . Tachycardia, paroxysmal ([facility])    Reportedly Paroxysmal Supraventricular Tachycardia, but unable to find documentation confirming this  . Thickening of esophagus  01/11/2017   Noted on CT scan June 2018  . Thyroid nodule   . Vertigo   . Vitamin B 12 deficiency     Surgical History: Past Surgical History:  Procedure Laterality Date  . APPENDECTOMY    . AUGMENTATION MAMMAPLASTY Right 1975   BREAST BREAST ONLY  . BLADDER TACKING     X 3  . BREAST LUMPECTOMY    . BREAST SURGERY    . CARDIAC CATHETERIZATION  10/2001   Normal coronary arteries with the exception of 20% proximal D1  . CHOLECYSTECTOMY    . COLONOSCOPY WITH PROPOFOL N/A 01/21/2015   Procedure: COLONOSCOPY WITH PROPOFOL;  Surgeon: Josefine Class, MD;  [facility]: [facility] END[facility]COPY;   Service: Endoscopy;  Laterality: N/A;  . ESOPHAGOGASTRODUODENOSCOPY N/A 01/21/2015   Procedure: ESOPHAGOGASTRODUODENOSCOPY (EGD);  Surgeon: Josefine Class, MD;  Location: [facility] ENDOSCOPY;  Service: Endoscopy;  Laterality: N/A;  . EYE SURGERIES     WITH BUCKLE DETACHMENT OF THE RETINA  . heart monitor     Not being used... Patient can not have a MRI  . HEMORRHOID SURGERY     WITH RECONSTRUCTION  . MASTECTOMY Right 1970   SUBCUTANEOUS - NO BREAST CANCER  . TONSILLECTOMY    . TOTAL ABDOMINAL HYSTERECTOMY W/ BILATERAL SALPINGOOPHORECTOMY    . TYMPANOPLASTY      Home Medications:  Allergies as of 02/04/2017      Reactions   Aricept [donepezil Hcl] Other (See Comments)   hallucinations   Biaxin [clarithromycin] Other (See Comments), Diarrhea   Copaxone [glatiramer Acetate]    Decadron [dexamethasone] Nausea And Vomiting, Other (See Comments)   Other reaction(s): Muscle Pain Reaction:  Abdominal pain   Dexamethasone Sodium Phosphate Other (See Comments)   Diltiazem Hcl Other (See Comments)   Reaction:  Unknown    Diltiazem Hcl Other (See Comments)   Flagyl [metronidazole] Nausea Only, Diarrhea   Other reaction(s): Vomiting   Gabapentin Other (See Comments)   "burning all over" Reaction:  Unknown    Iohexol Swelling, Other (See Comments)    Desc: tongue swelling with "IVP dye" sccording to nurses notes with a lumbar myelo-12/09- asm, Onset Date: [id number]  Pts tongue swells.   Ivp Dye [iodinated Diagnostic Agents] Swelling, Other (See Comments)   Passed out  Pts tongue swells.    Levothyroxine Nausea Only   Macrobid [nitrofurantoin Macrocrystal] Nausea And Vomiting   Sulfa Antibiotics Other (See Comments)   Reaction:  Unknown    Sulfonamide Derivatives Other (See Comments)   Reaction:  Unknown    Synthroid [levothyroxine Sodium]    Copaxone  [glatiramer] Rash   Interferon Beta-1a Other (See Comments), Rash   Reaction:  Unknown       Medication List       Accurate as of  02/04/17 11:59 PM. Always use your most recent med list.          acetaminophen 500 MG tablet Commonly known as:  TYLENOL Take by mouth.   alfuzosin 10 MG 24 hr tablet Commonly known as:  UROXATRAL Take 1 tablet (10 mg total) by mouth daily before supper.   amLODipine 2.5 MG tablet Commonly known as:  NORVASC TAKE 1 TABLET (2.5 MG TOTAL) BY MOUTH DAILY.   aspirin EC 81 MG tablet Take 1 tablet (81 mg total) by mouth daily.   atorvastatin 40 MG tablet Commonly known as:  LIPITOR Take 1 tablet (40 mg total) by mouth at bedtime.   b complex vitamins tablet Take 1 tablet by mouth  daily.   fluticasone 50 MCG/ACT nasal spray Commonly known as:  FLONASE SPRAY 2 SPRAYS EACH INTO BOTH NOSTRILS ONCE DAILY EACH NIGHT.   GENTEAL OP Apply 1 drop to eye. Each eye as needed   hydrocortisone 1 % Crea Commonly known as:  PROCTO-PAK Apply to rectum twice a day   meclizine 25 MG tablet Commonly known as:  ANTIVERT Take 1 tablet (25 mg total) by mouth every 8 (eight) hours as needed for dizziness.   memantine 10 MG tablet Commonly known as:  NAMENDA Take 10 mg by mouth 2 (two) times daily.   methylPREDNISolone 4 MG Tbpk tablet Commonly known as:  MEDROL DOSEPAK TAKE 6 TABLETS ON DAY 1 AS DIRECTED ON PACKAGE AND DECREASE BY 1 TAB EACH DAY FOR A TOTAL OF 6 DAYS   omeprazole 20 MG capsule Commonly known as:  PRILOSEC Take 1 capsule (20 mg total) by mouth daily.   ondansetron 4 MG tablet Commonly known as:  ZOFRAN Take 4 mg by mouth every 8 (eight) hours as needed for nausea or vomiting. Reported on 12/22/2015   oxyCODONE-acetaminophen 5-325 MG tablet Commonly known as:  ROXICET Take 1 tablet by mouth every 6 (six) hours as needed.   PROBIOTIC COLON SUPPORT Caps Take 1 capsule by mouth daily.   sertraline 50 MG tablet Commonly known as:  ZOLOFT Take 1 tablet (50 mg total) by mouth daily.   trimethoprim 100 MG tablet Commonly known as:  TRIMPEX TAKE 1 TABLET (100 MG TOTAL)  BY MOUTH DAILY.   Vitamin B-12 1000 MCG Subl       Allergies:  Allergies  Allergen Reactions  . Aricept [Donepezil Hcl] Other (See Comments)    hallucinations  . Biaxin [Clarithromycin] Other (See Comments) and Diarrhea  . Copaxone [Glatiramer Acetate]   . Decadron [Dexamethasone] Nausea And Vomiting and Other (See Comments)    Other reaction(s): Muscle Pain Reaction:  Abdominal pain  . Dexamethasone Sodium Phosphate Other (See Comments)  . Diltiazem Hcl Other (See Comments)    Reaction:  Unknown   . Diltiazem Hcl Other (See Comments)  . Flagyl [Metronidazole] Nausea Only and Diarrhea    Other reaction(s): Vomiting  . Gabapentin Other (See Comments)    "burning all over" Reaction:  Unknown   . Iohexol Swelling and Other (See Comments)     Desc: tongue swelling with "IVP dye" sccording to nurses notes with a lumbar myelo-12/09- asm, Onset Date: [id number]  Pts tongue swells.  Clementeen Hoof [Iodinated Diagnostic Agents] Swelling and Other (See Comments)    Passed out  Pts tongue swells.   . Levothyroxine Nausea Only  . Macrobid [Nitrofurantoin Macrocrystal] Nausea And Vomiting  . Sulfa Antibiotics Other (See Comments)    Reaction:  Unknown   . Sulfonamide Derivatives Other (See Comments)    Reaction:  Unknown   . Synthroid [Levothyroxine Sodium]   . Copaxone  [Glatiramer] Rash  . Interferon Beta-1a Other (See Comments) and Rash    Reaction:  Unknown     Family History: Family History  Problem Relation Age of Onset  . ALS Mother   . Kidney disease Sister   . Arthritis Sister   . Aneurysm Brother   . Heart disease Daughter   . Colon cancer Unknown   . Breast cancer Neg Hx   . Bladder Cancer Neg Hx   . Prostate cancer Neg Hx     Social History:  reports that she has never smoked. She has never used smokeless tobacco. She reports  that she does not drink alcohol or use drugs.  ROS: UROLOGY Frequent Urination?: No Hard to postpone urination?: Yes Burning/pain with  urination?: No Get up at night to urinate?: Yes Leakage of urine?: Yes Urine stream starts and stops?: No Trouble starting stream?: No Do you have to strain to urinate?: No Blood in urine?: No Urinary tract infection?: No Sexually transmitted disease?: No Injury to kidneys or bladder?: No Painful intercourse?: No Weak stream?: No Currently pregnant?: No Vaginal bleeding?: No Last menstrual period?: n  Gastrointestinal Nausea?: No Vomiting?: No Indigestion/heartburn?: No Diarrhea?: Yes Constipation?: Yes  Constitutional Fever: No Night sweats?: Yes Weight loss?: No Fatigue?: Yes  Skin Skin rash/lesions?: No Itching?: No  Eyes Blurred vision?: Yes Double vision?: No  Ears/Nose/Throat Sore throat?: No Sinus problems?: Yes  Hematologic/Lymphatic Swollen glands?: No Easy bruising?: No  Cardiovascular Leg swelling?: Yes Chest pain?: No  Respiratory Cough?: No Shortness of breath?: No  Endocrine Excessive thirst?: No  Musculoskeletal Back pain?: Yes Joint pain?: Yes  Neurological Headaches?: Yes Dizziness?: Yes  Psychologic Depression?: Yes Anxiety?: Yes  Physical Exam: BP 126/73   Pulse 89   Ht _0  (1.6 m)   Wt 150 lb (68 kg)   BMI 26.57 kg/m   Constitutional:  Alert and oriented, No acute distress. She's talking and laughing. HEENT: [place] AT, moist mucus membranes.  Trachea midline, no masses. Cardiovascular: No clubbing, cyanosis, or edema. Respiratory: Normal respiratory effort, no increased work of breathing. GI: Abdomen is soft, nontender, nondistended, no abdominal masses GU: No CVA tenderness. Skin: No rashes, bruises or suspicious lesions. Neurologic: Grossly intact, no focal deficits, moving all 4 extremities. Psychiatric: Normal mood and affect.  Laboratory Data: Lab Results  Component Value Date   WBC 8.5 01/09/2017   HGB 13.4 01/09/2017   HCT 40.5 01/09/2017   MCV 94.0 01/09/2017   PLT 203 01/09/2017    Lab Results    Component Value Date   CREATININE 1.11 (H) 01/11/2017    Lab Results  Component Value Date   HGBA1C 7.0 (H) 01/04/2017   I have reviewed the labs.  Urinalysis Moderate bacteria.  See EPIC.    Pertinent Imaging: ADDENDUM REPORT: 01/21/2017 14:02  ADDENDUM: A benign-appearing left splenic cyst measures 3.9 cm maximally, not significantly changed from prior imaging.   Electronically Signed   By: San Morelle M.D.   On: 01/21/2017 14:02   Addended by San Morelle, MD on 01/21/2017 2:04 PM    Study Result   CLINICAL DATA:  Persistent pain. Right-sided hydronephrosis and obstructing distal ureteral stone on CT scan at 01/09/2017.  EXAM: RENAL / URINARY TRACT ULTRASOUND COMPLETE  COMPARISON:  CT of the abdomen and pelvis 01/09/2017. Renal ultrasound 07/19/2015  FINDINGS: Right Kidney:  Length: 10.0 cm, within normal limits. Hydronephrosis is no longer present. There is some thinning of the cortex. Normal echogenicity is evident. A simple cyst at the upper pole measures 1.4 cm.  Left Kidney:  Length: 10.2 cm, within normal limits. Echogenicity is within normal limits. There 2 cystic lesions. A lesion at the upper pole measures 1.2 cm maximally. A 2.1 cm cyst in the midportion of the kidney has increased in size. This is not obstructing. No other stones are present.  Bladder:  Appears normal for degree of bladder distention.  IMPRESSION: 1. Interval resolution of right-sided hydronephrosis. 2. Bilateral renal cysts.  Electronically Signed: By: San Morelle M.D. On: 01/18/2017 11:22      I have independently reviewed the films.  Assessment &  Plan:    1. Right ureteral stone  - she was unable to capture the stone  2. Right hydronephrosis  - the hydronephrosis has resolved.    3. Microscopic hematuria  - UA today demonstrates no hematuria   4. Dysuria  - patient is currently undergoing PT  - continue  vaginal estrogen cream and trimethoprim  - UA is suspicious for infection - will send for culture  Return for Pending urine culture results.  Zara Council, PA-C  [facility] [address], [place], [place] [address] [phone]

## 2017-02-04 ENCOUNTER — Encounter: Payer: Self-pay | Admitting: Urology

## 2017-02-04 ENCOUNTER — Ambulatory Visit (INDEPENDENT_AMBULATORY_CARE_PROVIDER_SITE_OTHER): Payer: Medicare HMO | Admitting: Urology

## 2017-02-04 VITALS — BP 126/73 | HR 89 | Ht 63.0 in | Wt 150.0 lb

## 2017-02-04 DIAGNOSIS — R3129 Other microscopic hematuria: Secondary | ICD-10-CM | POA: Diagnosis not present

## 2017-02-04 DIAGNOSIS — N201 Calculus of ureter: Secondary | ICD-10-CM | POA: Diagnosis not present

## 2017-02-04 DIAGNOSIS — N132 Hydronephrosis with renal and ureteral calculous obstruction: Secondary | ICD-10-CM

## 2017-02-04 DIAGNOSIS — R300 Dysuria: Secondary | ICD-10-CM

## 2017-02-04 LAB — MICROSCOPIC EXAMINATION
RBC, UA: NONE SEEN /hpf (ref 0–?)
YEAST UA: NONE SEEN

## 2017-02-04 LAB — URINALYSIS, COMPLETE
Bilirubin, UA: NEGATIVE
Glucose, UA: NEGATIVE
Leukocytes, UA: NEGATIVE
Nitrite, UA: NEGATIVE
SPEC GRAV UA: 1.025 (ref 1.005–1.030)
Urobilinogen, Ur: 0.2 mg/dL (ref 0.2–1.0)
pH, UA: 5 (ref 5.0–7.5)

## 2017-02-04 NOTE — Progress Notes (Signed)
In and Out Catheterization  Patient is present today for a I & O catheterization for UA. Patient was cleaned and prepped in a sterile fashion with betadine and Lidocaine 2% jelly was instilled into the urethra.  A 14FR cath was inserted no complications were noted , 27ml of urine return was noted, urine was yellow in color. A clean urine sample was collected for UA and Culture. Bladder was drained  And catheter was removed with out difficulty.    Preformed by: Fonnie Jarvis, CMA

## 2017-02-06 LAB — CULTURE, URINE COMPREHENSIVE

## 2017-02-07 ENCOUNTER — Ambulatory Visit: Payer: Medicare HMO | Attending: Urology | Admitting: Physical Therapy

## 2017-02-07 DIAGNOSIS — M545 Low back pain: Secondary | ICD-10-CM | POA: Diagnosis not present

## 2017-02-07 DIAGNOSIS — G8929 Other chronic pain: Secondary | ICD-10-CM | POA: Diagnosis not present

## 2017-02-07 DIAGNOSIS — R52 Pain, unspecified: Secondary | ICD-10-CM

## 2017-02-07 DIAGNOSIS — R296 Repeated falls: Secondary | ICD-10-CM | POA: Insufficient documentation

## 2017-02-07 DIAGNOSIS — M791 Myalgia, unspecified site: Secondary | ICD-10-CM

## 2017-02-07 DIAGNOSIS — M6281 Muscle weakness (generalized): Secondary | ICD-10-CM | POA: Insufficient documentation

## 2017-02-07 DIAGNOSIS — M533 Sacrococcygeal disorders, not elsewhere classified: Secondary | ICD-10-CM | POA: Diagnosis not present

## 2017-02-07 DIAGNOSIS — R2681 Unsteadiness on feet: Secondary | ICD-10-CM | POA: Insufficient documentation

## 2017-02-07 NOTE — Therapy (Signed)
[place] MAIN [facility] SERVICES [address] [place], [place], [address] Phone: [phone]   Fax:  [phone]  Physical Therapy Treatment  Patient Details  Name: [name] MRN: [id number] Date of Birth: 1929/09/29 Referring Provider: Ernestine Conrad  Encounter Date: 02/07/2017      PT End of Session - 02/07/17 1425    Visit Number 7   Number of Visits 12   Date for PT Re-Evaluation 02/15/17   Authorization Type 7 g codes   PT Start Time 1340   PT Stop Time 1420   PT Time Calculation (min) 40 min   Activity Tolerance Patient tolerated treatment well;No increased pain   Behavior During Therapy WFL for tasks assessed/performed      Past Medical History:  Diagnosis Date  . Anxiety and depression   . Breast cancer ([place]) 2015   LT LUMPECTOMY  . Carpal tunnel syndrome   . Chest pain    SECONDARY TO GERD  . Chronic interstitial cystitis   . Chronic right hip pain   . Depression   . Diabetes mellitus without complication ([place])   . Diverticulosis   . Dyslipidemia   . Essential hypertension, benign   . Fibromyalgia   . Frequent falls   . GERD (gastroesophageal reflux disease)   . Hearing loss of left ear   . History of fractured rib   . History of TIA (transient ischemic attack)   . Irritable bowel   . Lumbar arthropathy ([place]) 10/04/2016   On CT scan, refer to ortho  . MS (multiple sclerosis) ([place])   . Osteoarthritis   . Paroxysmal supraventricular tachycardia ([place])   . Peripheral neuropathy    MILD  . Personal history of radiation therapy 2015   BREAST CA  . Protein malnutrition ([place])   . Pure hypercholesterolemia   . Recurrent UTI   . Skin cancer   . SOB (shortness of breath)    SECONDARY TO REFLUX  . Solitary pulmonary nodule on lung CT 12/04/2015   3 mm LLL lung nodule June 2016, March 2017  . Tachycardia, paroxysmal ([facility])    Reportedly Paroxysmal Supraventricular Tachycardia, but unable to find documentation confirming  this  . Thickening of esophagus 01/11/2017   Noted on CT scan June 2018  . Thyroid nodule   . Vertigo   . Vitamin B 12 deficiency     Past Surgical History:  Procedure Laterality Date  . APPENDECTOMY    . AUGMENTATION MAMMAPLASTY Right 1975   BREAST BREAST ONLY  . BLADDER TACKING     X 3  . BREAST LUMPECTOMY    . BREAST SURGERY    . CARDIAC CATHETERIZATION  10/2001   Normal coronary arteries with the exception of 20% proximal D1  . CHOLECYSTECTOMY    . COLONOSCOPY WITH PROPOFOL N/A 01/21/2015   Procedure: COLONOSCOPY WITH PROPOFOL;  Surgeon: Josefine Class, MD;  Location: [facility] ENDOSCOPY;  Service: Endoscopy;  Laterality: N/A;  . ESOPHAGOGASTRODUODENOSCOPY N/A 01/21/2015   Procedure: ESOPHAGOGASTRODUODENOSCOPY (EGD);  Surgeon: Josefine Class, MD;  Location: [facility] ENDOSCOPY;  Service: Endoscopy;  Laterality: N/A;  . EYE SURGERIES     WITH BUCKLE DETACHMENT OF THE RETINA  . heart monitor     Not being used... Patient can not have a MRI  . HEMORRHOID SURGERY     WITH RECONSTRUCTION  . MASTECTOMY Right 1970   SUBCUTANEOUS - NO BREAST CANCER  . TONSILLECTOMY    . TOTAL ABDOMINAL HYSTERECTOMY W/ BILATERAL SALPINGOOPHORECTOMY    .  TYMPANOPLASTY      There were no vitals filed for this visit.      Subjective Assessment - 02/07/17 1343    Subjective Pt reported her imaging at her urology appt showed growth of benign cysts on both kidneys. Pt also has passed her kidney stones. Pt reported her rectum burns with bowel movements. The rectal pain is no longer present all the time since the past Tx.  Pt will be seeing her gastroenterologist 02/19/17. Pt takes in 1 glass of lemonade, 2  (12 fl oz cans of sodas), 1 cup coffee,  1 can of Gatorade, 2 bottle (16 floz) of water.   Pt 's back pain occurs when she makes the bed and doing her dishes.  Pt was given a back brace but she only wears it when her back hurts bad.  Pt continues to loosen her back  when she takes a hot shower which has  "really helped" her.  Pt had a fall when moving from one couch to another couch this week. Pt was able to get back up on her own. Pt did not have any injuries.      Pertinent History Arthritis, neuropathy, MS, Breast CA, lumbar surgery    Patient Stated Goals not hurt when she steps on her leg            [facility] PT Assessment - 02/07/17 1423      Observation/Other Assessments   Observations less cuing for upright posture when walking.  Forward bending with making bed, bilateral stance with dish washing                      [facility] Adult PT Treatment/Exercise - 02/07/17 1424      Therapeutic Activites    Therapeutic Activities --  see pt instructions (less LBP w. 45 deg tandem stance _0 )                PT Education - 02/07/17 1424    Education provided Yes   Education Details HEP, education on bladder irritants    Person(s) Educated Patient   Methods Explanation;Demonstration;Tactile cues;Verbal cues;Handout   Comprehension Returned demonstration;Verbalized understanding             PT Long Term Goals - 01/31/17 1531      PT LONG TERM GOAL #1   Title Pt will demo no pelvic obliquities across 2 visits in order to walk without pain   Time 12   Period Weeks   Status Achieved     PT LONG TERM GOAL #2   Title Pt will increase her gait speed from 0.48 m/s to > 1.0 m/s with rollator in order to minimal fall risk. ( 6/4: 0.49ms)    Time 12   Period Weeks   Status Achieved     PT LONG TERM GOAL #3   Title Pt will decrease her ODI score from  % to <   % in order to return to ADLs   Time 12   Period Weeks   Status Deferred     PT LONG TERM GOAL #4   Title Pt will demo increased hip abduction strength from 3-/5 B to 4/5 in order to progress to dynamic stability training to ambulate int he community safely    Time 12   Period Weeks   Status Partially Met     PT LONG TERM GOAL #5   Title Pt will report no rectal pain when stepping on her foot  with  walking, bowel movements in order to improve QOL   Time 12   Period Weeks   Status Achieved     Additional Long Term Goals   Additional Long Term Goals Yes     PT LONG TERM GOAL #6   Title Pt will report no rectal pain with palpation to SIJ across 2 visits in order to maintain SIJ alignment and minimize pain   Time 12   Period Weeks   Status On-going     PT LONG TERM GOAL #7   Title Pt will demo decreased scar restrictions along abdominal and lumbar scars in order to promote improved rectal pain, motility, and pelvic floor mobility.     Time 12   Period Weeks   Status New               Plan - 02/07/17 1425    Clinical Impression Statement Pt continues to show increased gait speed, more upright posture, and more flexibility in her spine. Pt reports her rectal pain only occurs with bowel movements instead of constantly. Pt reports it burns like her bladder. Pt was educated on decreasing her bladder irritants which were excessive in ratio to her water intake. Pt voiced understanding. Pt also was educated on proper body mechanics with dish washing and bed making and pt reported less LBP with new techniques. Pt progressed to lateral scooting along bed promote strength and to address her report of falling onto her floor when changing couches. Pt reported she did not incur any injuries with the fall this week and was able to get up on her own. Pt was also educated on wearing sandals with a heel strap instead of slip on sandal in order to promote safety and minimize risk of falls.  Pt will be seeing her gastroenterologist in two weeks. PT will f/u on her results and findings. Pt's latest urology update include:  growth of benign cysts on both kidneys and passing of her kidneys stones.  Anticapte pt will be ready for d/c with a few more visits.     Rehab Potential Good   Clinical Impairments Affecting Rehab Potential Hx of falls, hysterectomy, arthritis, neuropathy, MS, Breast CA, lumbar  surgery, uses a rollator    PT Frequency 1x / week   PT Treatment/Interventions ADLs/Self Care Home Management;Aquatic Therapy;Electrical Stimulation;Neuromuscular re-education;Moist Heat;Balance training;Therapeutic exercise;Therapeutic activities;Functional mobility training;Stair training;Gait training;Patient/family education;Manual techniques;Manual lymph drainage;Scar mobilization;Taping   Consulted and Agree with Plan of Care Patient      Patient will benefit from skilled therapeutic intervention in order to improve the following deficits and impairments:  Abnormal gait, Pain, Increased muscle spasms, Decreased range of motion, Decreased safety awareness, Postural dysfunction, Improper body mechanics, Decreased strength, Decreased activity tolerance, Decreased balance, Decreased mobility, Decreased scar mobility, Decreased endurance, Impaired sensation, Decreased skin integrity, Decreased coordination, Difficulty walking, Increased fascial restricitons, Hypomobility  Visit Diagnosis: Muscle weakness (generalized)  Sacrococcygeal disorders, not elsewhere classified  Falls frequently  Chronic bilateral low back pain without sciatica  Myalgia  Unsteadiness on feet  Pain aggravated by walking     Problem List Patient Active Problem List   Diagnosis Date Noted  . Thickening of esophagus 01/11/2017  . Lumbar arthropathy ([place]) 10/04/2016  . Vertigo, aural, left 07/31/2016  . Rectal pain 06/25/2016  . External hemorrhoid 06/18/2016  . Head injury due to trauma 05/09/2016  . Neoplasm of uncertain behavior of skin 05/09/2016  . Hemorrhoids 04/24/2016  . Dementia 04/24/2016  . Medication monitoring encounter 04/03/2016  .  Gait abnormality 04/03/2016  . Skin lump of leg 04/03/2016  . Pain in right knee 02/15/2016  . Pain and swelling of right knee 02/13/2016  . Right knee sprain 02/13/2016  . Solitary pulmonary nodule on lung CT 12/04/2015  . Generalized degenerative joint  disease of hand 11/21/2015  . Triggering of digit 11/21/2015  . Snapping thumb syndrome 11/21/2015  . Pain, joint, hand 11/11/2015  . Coronary artery calcification seen on CAT scan 11/11/2015  . Multiple sclerosis ([place]) 11/11/2015  . Chronic pain of multiple joints 09/02/2015  . Right lower quadrant abdominal pain 08/02/2015  . Microscopic hematuria 07/12/2015  . Recurrent UTI 04/09/2015  . Carpal tunnel syndrome 01/12/2014  . DD (diverticular disease) 01/12/2014  . Ductal carcinoma in situ of left breast 01/12/2014  . Hypercholesteremia 01/12/2014  . DS (disseminated sclerosis) ([place]) 01/12/2014  . Arthritis, degenerative 01/12/2014  . Thyroid nodule 01/12/2014  . Cyanocobalamine deficiency (non anemic) 01/12/2014  . Anxiety and depression 01/12/2014  . Intraductal carcinoma of breast 01/12/2014  . History of right mastectomy 01/12/2014  . DM (diabetes mellitus) type II controlled, neurological manifestation ([place]) 06/26/2010  . Depression, major, recurrent, in partial remission ([place]) 06/26/2010  . Frequent falls 06/26/2010  . Stricture and stenosis of esophagus 06/26/2010  . Gastro-esophageal reflux disease without esophagitis 06/26/2010  . Fibromyalgia 06/26/2010  . Chronic interstitial cystitis 06/26/2010  . H/O malignant neoplasm of breast 06/26/2010  . Hypertension goal BP (blood pressure) < 140/90 06/01/2009  . H/O paroxysmal supraventricular tachycardia 06/01/2009  . Paroxysmal supraventricular tachycardia ([place]) 06/01/2009  . Irritable bowel syndrome with constipation and diarrhea 06/16/2008  . Diarrhea, functional 06/16/2008  . Diarrhea, unspecified 06/16/2008    Jerl Mina ,PT, DPT, E-RYT  02/07/2017, 2:32 PM  [place] MAIN [facility] SERVICES [address] [place], [place], [address] Phone: [phone]   Fax:  [phone]  Name: ERMIE GLENDENNING MRN: [id number] Date of Birth: 05-09-1930

## 2017-02-07 NOTE — Patient Instructions (Addendum)
Use the mini squat to put make the bed   Then practice lateral scooting along half of bed To the Left and Right  In the morning and evening . To build strength  ___________  Stand at sink with body at 45 degrees to have one foot more forward than the other.  Switch sides it need.   Also use a stool with no wheels to sit on if need.    _____________ Decrease bladder irritants ( eliminate 2 sodas / per day ) this week,  Next week, decrease to half a glass of lemonade.   Increase  (16 floz) of water from 2 bottles to 3    Gradually, change bladder irritants and water ratio  from 5: 2   To  3:3  And eventually 1 bladder irritant to 3 water      \

## 2017-02-08 ENCOUNTER — Telehealth: Payer: Self-pay | Admitting: Family Medicine

## 2017-02-08 NOTE — Telephone Encounter (Signed)
-----   Message from Nori Riis, PA-C sent at 02/06/2017  6:36 PM EDT ----- Please let Mrs. Dadisman know that her urine culture was negative.

## 2017-02-08 NOTE — Telephone Encounter (Signed)
Patient notified

## 2017-02-11 ENCOUNTER — Encounter: Payer: Commercial Managed Care - HMO | Admitting: Physical Therapy

## 2017-02-12 ENCOUNTER — Other Ambulatory Visit: Payer: Self-pay | Admitting: Family Medicine

## 2017-02-12 MED ORDER — ATORVASTATIN CALCIUM 40 MG PO TABS: 40.0000 mg | ORAL_TABLET | Freq: Every day | ORAL | 0 refills | Status: DC

## 2017-02-18 DIAGNOSIS — F0280 Dementia in other diseases classified elsewhere without behavioral disturbance: Secondary | ICD-10-CM | POA: Diagnosis not present

## 2017-02-18 DIAGNOSIS — E538 Deficiency of other specified B group vitamins: Secondary | ICD-10-CM | POA: Diagnosis not present

## 2017-02-18 DIAGNOSIS — Z8659 Personal history of other mental and behavioral disorders: Secondary | ICD-10-CM | POA: Diagnosis not present

## 2017-02-18 DIAGNOSIS — G4700 Insomnia, unspecified: Secondary | ICD-10-CM | POA: Diagnosis not present

## 2017-02-18 DIAGNOSIS — F0150 Vascular dementia without behavioral disturbance: Secondary | ICD-10-CM | POA: Diagnosis not present

## 2017-02-18 DIAGNOSIS — G309 Alzheimer's disease, unspecified: Secondary | ICD-10-CM | POA: Diagnosis not present

## 2017-02-19 ENCOUNTER — Encounter: Payer: Self-pay | Admitting: Gastroenterology

## 2017-02-19 ENCOUNTER — Other Ambulatory Visit: Payer: Self-pay

## 2017-02-19 ENCOUNTER — Ambulatory Visit (INDEPENDENT_AMBULATORY_CARE_PROVIDER_SITE_OTHER): Payer: Medicare HMO | Admitting: Gastroenterology

## 2017-02-19 VITALS — BP 135/86 | HR 84 | Temp 98.7°F | Ht 62.0 in | Wt 145.0 lb

## 2017-02-19 DIAGNOSIS — R1319 Other dysphagia: Secondary | ICD-10-CM

## 2017-02-19 DIAGNOSIS — R1310 Dysphagia, unspecified: Secondary | ICD-10-CM

## 2017-02-19 MED ORDER — OMEPRAZOLE 20 MG PO CPDR: 20.0000 mg | DELAYED_RELEASE_CAPSULE | Freq: Every day | ORAL | 1 refills | Status: DC

## 2017-02-19 NOTE — Progress Notes (Signed)
Diane Bellows MD, MRCP(U.K) [place]  [place], [place] [address]  Main: [phone]  Fax: [phone]   Gastroenterology Consultation  Referring Provider:     Arnetha Courser, MD Primary Care Physician:  Arnetha Courser, MD Primary Gastroenterologist:  Dr. Jonathon Flores  Reason for Consultation:     Thickening of the esophagus         HPI:   Diane ERTL is a 83 y.o. y/o female referred for consultation & management  by Dr. Sanda Klein, Satira Anis, MD.    She has been referred for a thickened esophagus seen on a CT scan of the abdomen . She was previously under the care of GI at [facility] clinic who had dilated her esophagus in the past (schatzkis ring dilated to 18 mm in 2016 ). She has a history of GERD. Hb 13.4 on 01/09/17 .   Says her swallowing is bad, food "comes back up , including water"on and off , ongoing since last few months. She says she was having the same issue in 2016 . She is here today with her daughter who says her mothers memory is good and bad.   Some pain occassionally when food gets stuck. She has a prescription for prilosec but unclear if she takes it.     Past Medical History:  Diagnosis Date  . Anxiety and depression   . Breast cancer ([place]) 2015   LT LUMPECTOMY  . Carpal tunnel syndrome   . Chest pain    SECONDARY TO GERD  . Chronic interstitial cystitis   . Chronic right hip pain   . Depression   . Diabetes mellitus without complication ([place])   . Diverticulosis   . Dyslipidemia   . Essential hypertension, benign   . Fibromyalgia   . Frequent falls   . GERD (gastroesophageal reflux disease)   . Hearing loss of left ear   . History of fractured rib   . History of TIA (transient ischemic attack)   . Irritable bowel   . Lumbar arthropathy ([place]) 10/04/2016   On CT scan, refer to ortho  . MS (multiple sclerosis) ([place])   . Osteoarthritis   . Paroxysmal supraventricular tachycardia ([place])   . Peripheral neuropathy    MILD  . Personal  history of radiation therapy 2015   BREAST CA  . Protein malnutrition ([place])   . Pure hypercholesterolemia   . Recurrent UTI   . Skin cancer   . SOB (shortness of breath)    SECONDARY TO REFLUX  . Solitary pulmonary nodule on lung CT 12/04/2015   3 mm LLL lung nodule June 2016, March 2017  . Tachycardia, paroxysmal ([facility])    Reportedly Paroxysmal Supraventricular Tachycardia, but unable to find documentation confirming this  . Thickening of esophagus 01/11/2017   Noted on CT scan June 2018  . Thyroid nodule   . Vertigo   . Vitamin B 12 deficiency     Past Surgical History:  Procedure Laterality Date  . APPENDECTOMY    . AUGMENTATION MAMMAPLASTY Right 1975   BREAST BREAST ONLY  . BLADDER TACKING     X 3  . BREAST LUMPECTOMY    . BREAST SURGERY    . CARDIAC CATHETERIZATION  10/2001   Normal coronary arteries with the exception of 20% proximal D1  . CHOLECYSTECTOMY    . COLONOSCOPY WITH PROPOFOL N/A 01/21/2015   Procedure: COLONOSCOPY WITH PROPOFOL;  Surgeon: Josefine Class, MD;  Location: [facility] ENDOSCOPY;  Service: Endoscopy;  Laterality: N/A;  . ESOPHAGOGASTRODUODENOSCOPY N/A 01/21/2015   Procedure: ESOPHAGOGASTRODUODENOSCOPY (EGD);  Surgeon: Josefine Class, MD;  Location: [facility] ENDOSCOPY;  Service: Endoscopy;  Laterality: N/A;  . EYE SURGERIES     WITH BUCKLE DETACHMENT OF THE RETINA  . heart monitor     Not being used... Patient can not have a MRI  . HEMORRHOID SURGERY     WITH RECONSTRUCTION  . MASTECTOMY Right 1970   SUBCUTANEOUS - NO BREAST CANCER  . TONSILLECTOMY    . TOTAL ABDOMINAL HYSTERECTOMY W/ BILATERAL SALPINGOOPHORECTOMY    . TYMPANOPLASTY      Prior to Admission medications   Medication Sig Start Date End Date Taking? Authorizing Provider  acetaminophen (TYLENOL) 500 MG tablet Take by mouth.    [provider]  alfuzosin (UROXATRAL) 10 MG 24 hr tablet Take 1 tablet (10 mg total) by mouth daily before supper. 01/15/17   Festus Aloe, MD    amLODipine (NORVASC) 2.5 MG tablet TAKE 1 TABLET (2.5 MG TOTAL) BY MOUTH DAILY. 10/29/16   Arnetha Courser, MD  aspirin EC 81 MG tablet Take 1 tablet (81 mg total) by mouth daily. 11/11/15   Arnetha Courser, MD  atorvastatin (LIPITOR) 40 MG tablet Take 1 tablet (40 mg total) by mouth at bedtime. 02/12/17   Arnetha Courser, MD  b complex vitamins tablet Take 1 tablet by mouth daily.    [provider]  Carboxymethylcell-Hypromellose (GENTEAL OP) Apply 1 drop to eye. Each eye as needed    [provider]  Cyanocobalamin (VITAMIN B-12) 1000 MCG SUBL  09/16/15   [provider]  fluticasone (FLONASE) 50 MCG/ACT nasal spray SPRAY 2 SPRAYS EACH INTO BOTH NOSTRILS ONCE DAILY EACH NIGHT. 10/19/15   [provider]  hydrocortisone (PROCTO-PAK) 1 % CREA Apply to rectum twice a day 09/03/16   Arnetha Courser, MD  meclizine (ANTIVERT) 25 MG tablet Take 1 tablet (25 mg total) by mouth every 8 (eight) hours as needed for dizziness. 07/31/16   Arnetha Courser, MD  memantine (NAMENDA) 10 MG tablet Take 10 mg by mouth 2 (two) times daily.  08/11/16   [provider]  methylPREDNISolone (MEDROL DOSEPAK) 4 MG TBPK tablet TAKE 6 TABLETS ON DAY 1 AS DIRECTED ON PACKAGE AND DECREASE BY 1 TAB EACH DAY FOR A TOTAL OF 6 DAYS 01/08/17   [provider]  omeprazole (PRILOSEC) 20 MG capsule Take 1 capsule (20 mg total) by mouth daily. 01/11/17   Lada, Satira Anis, MD  ondansetron (ZOFRAN) 4 MG tablet Take 4 mg by mouth every 8 (eight) hours as needed for nausea or vomiting. Reported on 12/22/2015    [provider]  oxyCODONE-acetaminophen (ROXICET) 5-325 MG tablet Take 1 tablet by mouth every 6 (six) hours as needed. 01/09/17 01/09/18  Darel Hong, MD  Probiotic Product (PROBIOTIC COLON SUPPORT) CAPS Take 1 capsule by mouth daily.     [provider]  sertraline (ZOLOFT) 50 MG tablet Take 1 tablet (50 mg total) by mouth daily. 02/13/16   Lada, Satira Anis, MD  trimethoprim  (TRIMPEX) 100 MG tablet TAKE 1 TABLET (100 MG TOTAL) BY MOUTH DAILY. 01/21/17   Festus Aloe, MD    Family History  Problem Relation Age of Onset  . ALS Mother   . Kidney disease Sister   . Arthritis Sister   . Aneurysm Brother   . Heart disease Daughter   . Colon cancer Unknown   . Breast cancer Neg  Hx   . Bladder Cancer Neg Hx   . Prostate cancer Neg Hx      Social History  Substance Use Topics  . Smoking status: Never Smoker  . Smokeless tobacco: Never Used  . Alcohol use No    Allergies as of 02/19/2017 - Review Complete 02/04/2017  Allergen Reaction Noted  . Aricept [donepezil hcl] Other (See Comments) 04/27/2016  . Biaxin [clarithromycin] Other (See Comments) and Diarrhea 06/16/2008  . Copaxone [glatiramer acetate]  01/20/2015  . Decadron [dexamethasone] Nausea And Vomiting and Other (See Comments) 01/06/2015  . Dexamethasone sodium phosphate Other (See Comments) 07/20/2015  . Diltiazem hcl Other (See Comments)   . Diltiazem hcl Other (See Comments) 02/21/2015  . Flagyl [metronidazole] Nausea Only and Diarrhea 01/06/2015  . Gabapentin Other (See Comments)   . Iohexol Swelling and Other (See Comments) 08/22/2008  . Ivp dye [iodinated diagnostic agents] Swelling and Other (See Comments) 01/06/2015  . Levothyroxine Nausea Only 01/13/2014  . Macrobid [nitrofurantoin macrocrystal] Nausea And Vomiting 08/30/2016  . Sulfa antibiotics Other (See Comments) 11/24/2015  . Sulfonamide derivatives Other (See Comments)   . Synthroid [levothyroxine sodium]  01/20/2015  . Copaxone  [glatiramer] Rash 02/21/2015  . Interferon beta-1a Other (See Comments) and Rash     Review of Systems:    All systems reviewed and negative except where noted in HPI.   Physical Exam:  There were no vitals taken for this visit. No LMP recorded. Patient has had a hysterectomy. Psych:  Alert and cooperative. Normal mood and affect. General:   Alert,  Well-developed, well-nourished, pleasant and  cooperative in NAD Head:  Normocephalic and atraumatic. Eyes:  Sclera clear, no icterus.   Conjunctiva pink. Ears:  Normal auditory acuity. Nose:  No deformity, discharge, or lesions. Mouth:  No deformity or lesions,oropharynx pink & moist. Neck:  Supple; no masses or thyromegaly. Lungs:  Respirations even and unlabored.  Clear throughout to auscultation.   No wheezes, crackles, or rhonchi. No acute distress. Heart:  Regular rate and rhythm; no murmurs, clicks, rubs, or gallops. Abdomen:  Normal bowel sounds.  No bruits.  Soft, non-tender and non-distended without masses, hepatosplenomegaly or hernias noted.  No guarding or rebound tenderness.    Extremities:  No clubbing or edema.  No cyanosis. Neurologic:  Alert and oriented x2;  grossly normal neurologically. Skin:  Intact without significant lesions or rashes. No jaundice. Lymph Nodes:  No significant cervical adenopathy. Psych:  Alert and cooperative. Normal mood and affect.  Imaging Studies: No results found.  Assessment and Plan:   JAMILETT FERRANTE is a 83 y.o. y/o female has been referred for thickened esophagus seen on recent CT scan of the abdomen , some history of dysphagia.   Plan  1. EGD +/- dilation    I have discussed alternative options, risks & benefits,  which include, but are not limited to, bleeding, infection, perforation,respiratory complication & drug reaction.  The patient agrees with this plan & written consent will be obtained.   Follow up in 22months   Dr Diane Bellows MD,MRCP(U.K)

## 2017-02-21 ENCOUNTER — Ambulatory Visit: Payer: Medicare HMO | Admitting: Physical Therapy

## 2017-02-21 DIAGNOSIS — M533 Sacrococcygeal disorders, not elsewhere classified: Secondary | ICD-10-CM

## 2017-02-21 DIAGNOSIS — M545 Low back pain: Secondary | ICD-10-CM

## 2017-02-21 DIAGNOSIS — M6281 Muscle weakness (generalized): Secondary | ICD-10-CM | POA: Diagnosis not present

## 2017-02-21 DIAGNOSIS — M791 Myalgia, unspecified site: Secondary | ICD-10-CM

## 2017-02-21 DIAGNOSIS — R296 Repeated falls: Secondary | ICD-10-CM

## 2017-02-21 DIAGNOSIS — G8929 Other chronic pain: Secondary | ICD-10-CM | POA: Diagnosis not present

## 2017-02-21 DIAGNOSIS — R52 Pain, unspecified: Secondary | ICD-10-CM | POA: Diagnosis not present

## 2017-02-21 DIAGNOSIS — R2681 Unsteadiness on feet: Secondary | ICD-10-CM | POA: Diagnosis not present

## 2017-02-22 DIAGNOSIS — M6281 Muscle weakness (generalized): Secondary | ICD-10-CM | POA: Diagnosis not present

## 2017-02-22 DIAGNOSIS — R296 Repeated falls: Secondary | ICD-10-CM | POA: Diagnosis not present

## 2017-02-22 DIAGNOSIS — M791 Myalgia: Secondary | ICD-10-CM | POA: Diagnosis not present

## 2017-02-22 DIAGNOSIS — R52 Pain, unspecified: Secondary | ICD-10-CM | POA: Diagnosis not present

## 2017-02-22 DIAGNOSIS — G8929 Other chronic pain: Secondary | ICD-10-CM | POA: Diagnosis not present

## 2017-02-22 DIAGNOSIS — M545 Low back pain: Secondary | ICD-10-CM | POA: Diagnosis not present

## 2017-02-22 DIAGNOSIS — R2681 Unsteadiness on feet: Secondary | ICD-10-CM | POA: Diagnosis not present

## 2017-02-22 DIAGNOSIS — M533 Sacrococcygeal disorders, not elsewhere classified: Secondary | ICD-10-CM | POA: Diagnosis not present

## 2017-02-22 NOTE — Therapy (Addendum)
[place] MAIN [facility] SERVICES [address] [place], [place], [address] Phone: [phone]   Fax:  [phone]  Physical Therapy Treatment /Discharge Note  Patient Details  Name: [name] MRN: [id number] Date of Birth: [birth year] Referring Provider: Ernestine Conrad  Encounter Date: 02/21/2017      PT End of Session - 02/22/17 1510    Visit Number 8   Number of Visits 12   Date for PT Re-Evaluation 02/15/17   Authorization Type 8 g codes   PT Start Time 1340   PT Stop Time 1410   PT Time Calculation (min) 30 min   Activity Tolerance Patient tolerated treatment well;No increased pain   Behavior During Therapy WFL for tasks assessed/performed      Past Medical History:  Diagnosis Date  . Anxiety and depression   . Breast cancer ([place]) 2015   LT LUMPECTOMY  . Carpal tunnel syndrome   . Chest pain    SECONDARY TO GERD  . Chronic interstitial cystitis   . Chronic right hip pain   . Depression   . Diabetes mellitus without complication ([place])   . Diverticulosis   . Dyslipidemia   . Essential hypertension, benign   . Fibromyalgia   . Frequent falls   . GERD (gastroesophageal reflux disease)   . Hearing loss of left ear   . History of fractured rib   . History of TIA (transient ischemic attack)   . Irritable bowel   . Lumbar arthropathy ([place]) 10/04/2016   On CT scan, refer to ortho  . MS (multiple sclerosis) ([place])   . Osteoarthritis   . Paroxysmal supraventricular tachycardia ([place])   . Peripheral neuropathy    MILD  . Personal history of radiation therapy 2015   BREAST CA  . Protein malnutrition ([place])   . Pure hypercholesterolemia   . Recurrent UTI   . Skin cancer   . SOB (shortness of breath)    SECONDARY TO REFLUX  . Solitary pulmonary nodule on lung CT 12/04/2015   3 mm LLL lung nodule June 2016, March 2017  . Tachycardia, paroxysmal ([facility])    Reportedly Paroxysmal Supraventricular Tachycardia, but unable to find  documentation confirming this  . Thickening of esophagus 01/11/2017   Noted on CT scan June 2018  . Thyroid nodule   . Vertigo   . Vitamin B 12 deficiency     Past Surgical History:  Procedure Laterality Date  . APPENDECTOMY    . AUGMENTATION MAMMAPLASTY Right 1975   BREAST BREAST ONLY  . BLADDER TACKING     X 3  . BREAST LUMPECTOMY    . BREAST SURGERY    . CARDIAC CATHETERIZATION  10/2001   Normal coronary arteries with the exception of 20% proximal D1  . CHOLECYSTECTOMY    . COLONOSCOPY WITH PROPOFOL N/A 01/21/2015   Procedure: COLONOSCOPY WITH PROPOFOL;  Surgeon: Josefine Class, MD;  Location: [facility] ENDOSCOPY;  Service: Endoscopy;  Laterality: N/A;  . ESOPHAGOGASTRODUODENOSCOPY N/A 01/21/2015   Procedure: ESOPHAGOGASTRODUODENOSCOPY (EGD);  Surgeon: Josefine Class, MD;  Location: [facility] ENDOSCOPY;  Service: Endoscopy;  Laterality: N/A;  . EYE SURGERIES     WITH BUCKLE DETACHMENT OF THE RETINA  . heart monitor     Not being used... Patient can not have a MRI  . HEMORRHOID SURGERY     WITH RECONSTRUCTION  . MASTECTOMY Right 1970   SUBCUTANEOUS - NO BREAST CANCER  . TONSILLECTOMY    . TOTAL ABDOMINAL HYSTERECTOMY W/ BILATERAL  SALPINGOOPHORECTOMY    . TYMPANOPLASTY      There were no vitals filed for this visit.      Subjective Assessment - 02/21/17 1342    Subjective Pt reported her rectal pain has come back with constipation. Pt notices that the area is swollen. One doctor has informed her she has hemorrhoids.  Suppositories and hemmorhoid cream were not helpful.   Pt is seeing an GI doctor who will be performing endoscopy because she has been regurtitating when she bends forward but it goes back down.  Pt has been atleast 3 bottles of water per day since session as recommended by PT.  Pt's low back has been better.  Pt has reported she has had cystocele/ rectocele in the past.      Pertinent History Arthritis, neuropathy, MS, Breast CA, lumbar surgery    Patient Stated  Goals not hurt when she steps on her leg                      Pelvic Floor Special Questions - 02/22/17 1511    Skin Integrity Hemorroids   Pelvic Floor Internal Exam Pt consented verbally without contraindication    Exam Type Rectal   Palpation burning reported  at the depth past EAS. Discontinued further assessment past this point in the anal canal after pt's report of burning.  Noted dyscoordination of rectal contraction w/ inhalation. Cued for proper coordination. Able to demo proper after cues.             [facility] Adult PT Treatment/Exercise - 02/22/17 1516      Therapeutic Activites    Other Therapeutic Activities seated practice: fully clothed. simulated toilet training w/ foot stool, demo'd diaphragmatic excursion and less chest breathing, proper lengthening of pelvic floor mm      Manual Therapy   Internal Pelvic Floor internal assesment slightly above dentate line, above EAS. Cued for proper lengthening of pelvic floor with inhalation                     PT Long Term Goals - 02/22/17 1518      PT LONG TERM GOAL #1   Title Pt will demo no pelvic obliquities across 2 visits in order to walk without pain   Time 12   Period Weeks   Status Achieved     PT LONG TERM GOAL #2   Title Pt will increase her gait speed from 0.48 m/s to > 1.0 m/s with rollator in order to minimal fall risk. ( 6/4: 0.45ms)    Time 12   Period Weeks   Status Achieved     PT LONG TERM GOAL #3   Title Pt will decrease her ODI score from  % to <   % in order to return to ADLs   Time 12   Period Weeks   Status Deferred     PT LONG TERM GOAL #4   Title Pt will demo increased hip abduction strength from 3-/5 B to 4/5 in order to progress to dynamic stability training to ambulate int he community safely    Time 12   Period Weeks   Status Partially Met     PT LONG TERM GOAL #5   Title Pt will report no rectal pain when stepping on her foot with walking, bowel movements  in order to improve QOL   Time 12   Period Weeks   Status Partially Met     PT  LONG TERM GOAL #6   Title Pt will report no rectal pain with palpation to SIJ across 2 visits in order to maintain SIJ alignment and minimize pain   Time 12   Period Weeks   Status Partially Met     PT LONG TERM GOAL #7   Title Pt will demo decreased scar restrictions along abdominal and lumbar scars in order to promote improved rectal pain, motility, and pelvic floor mobility.     Time 12   Period Weeks   Status Achieved               Plan - 02/22/17 1511    Clinical Impression Statement Pt achieved 50% of her goals across the past 8 visits. Pt has demo'd improved spinal and sacroiliac mobility along with increased gait speed without gait deviations. Pt remains complaint with her HEP which decreases her spinal mm tensions and increases her spinal mobility. Pt's rectal pain has returned and upon internal rectal assessment, pt showed hemorrhoids and dyscoordination of her pelvic floor mm ( anal closure with inhalation instead of pelvic floor lengthening/anal opening in addition to abdominal straining with bearing down of pelvic floor mm). Digital rectal assessment did not proceed beyond external anal sphincter due to pain.   Following Tx today, pt demo'd improved coordination.  Pt has also been educated on decreasing her bladder irritants ( tea) and increasing her water intake in order to promote optimal bladder and bowel health. Pt continues to work on this lifestyle change. Pt is getting d/c at this time because pt has been provided multiple musculoskeletal based treatment option. Pt is undergoing GI work-up for her current issue related to regurgitation/ difficulty swallowing. Plan to wait for MD to clear pt for pelvic health PT following her medical work -up  if MD deems appropriate and beneficial.     Rehab Potential Good   Clinical Impairments Affecting Rehab Potential Hx of falls, hysterectomy,  arthritis, neuropathy, MS, Breast CA, lumbar surgery, uses a rollator    PT Frequency 1x / week   PT Treatment/Interventions ADLs/Self Care Home Management;Aquatic Therapy;Electrical Stimulation;Neuromuscular re-education;Moist Heat;Balance training;Therapeutic exercise;Therapeutic activities;Functional mobility training;Stair training;Gait training;Patient/family education;Manual techniques;Manual lymph drainage;Scar mobilization;Taping   Consulted and Agree with Plan of Care Patient      Patient will benefit from skilled therapeutic intervention in order to improve the following deficits and impairments:  Abnormal gait, Pain, Increased muscle spasms, Decreased range of motion, Decreased safety awareness, Postural dysfunction, Improper body mechanics, Decreased strength, Decreased activity tolerance, Decreased balance, Decreased mobility, Decreased scar mobility, Decreased endurance, Impaired sensation, Decreased skin integrity, Decreased coordination, Difficulty walking, Increased fascial restricitons, Hypomobility  Visit Diagnosis: Muscle weakness (generalized)  Sacrococcygeal disorders, not elsewhere classified  Falls frequently  Chronic bilateral low back pain without sciatica  Myalgia       G-Codes - 03/07/2017 1520    Functional Assessment Tool Used (Outpatient Only) clinical judgement    Functional Limitation Mobility: Walking and moving around   Mobility: Walking and Moving Around Current Status [phone]) At least 20 percent but less than 40 percent impaired, limited or restricted   Mobility: Walking and Moving Around Goal Status [phone]) At least 20 percent but less than 40 percent impaired, limited or restricted   Mobility: Walking and Moving Around Discharge Status [phone]) At least 20 percent but less than 40 percent impaired, limited or restricted      Problem List Patient Active Problem List   Diagnosis Date Noted  . Thickening of esophagus 01/11/2017  .  Lumbar  arthropathy ([place]) 10/04/2016  . Vertigo, aural, left 07/31/2016  . Rectal pain 06/25/2016  . External hemorrhoid 06/18/2016  . Head injury due to trauma 05/09/2016  . Neoplasm of uncertain behavior of skin 05/09/2016  . Hemorrhoids 04/24/2016  . Dementia 04/24/2016  . Medication monitoring encounter 04/03/2016  . Gait abnormality 04/03/2016  . Skin lump of leg 04/03/2016  . Pain in right knee 02/15/2016  . Pain and swelling of right knee 02/13/2016  . Right knee sprain 02/13/2016  . Solitary pulmonary nodule on lung CT 12/04/2015  . Generalized degenerative joint disease of hand 11/21/2015  . Triggering of digit 11/21/2015  . Snapping thumb syndrome 11/21/2015  . Pain, joint, hand 11/11/2015  . Coronary artery calcification seen on CAT scan 11/11/2015  . Multiple sclerosis ([place]) 11/11/2015  . Chronic pain of multiple joints 09/02/2015  . Right lower quadrant abdominal pain 08/02/2015  . Microscopic hematuria 07/12/2015  . Recurrent UTI 04/09/2015  . Carpal tunnel syndrome 01/12/2014  . DD (diverticular disease) 01/12/2014  . Ductal carcinoma in situ of left breast 01/12/2014  . Hypercholesteremia 01/12/2014  . DS (disseminated sclerosis) ([place]) 01/12/2014  . Arthritis, degenerative 01/12/2014  . Thyroid nodule 01/12/2014  . Cyanocobalamine deficiency (non anemic) 01/12/2014  . Anxiety and depression 01/12/2014  . Intraductal carcinoma of breast 01/12/2014  . History of right mastectomy 01/12/2014  . DM (diabetes mellitus) type II controlled, neurological manifestation ([place]) 06/26/2010  . Depression, major, recurrent, in partial remission ([place]) 06/26/2010  . Frequent falls 06/26/2010  . Stricture and stenosis of esophagus 06/26/2010  . Gastro-esophageal reflux disease without esophagitis 06/26/2010  . Fibromyalgia 06/26/2010  . Chronic interstitial cystitis 06/26/2010  . H/O malignant neoplasm of breast 06/26/2010  . Hypertension goal BP (blood pressure) < 140/90 06/01/2009   . H/O paroxysmal supraventricular tachycardia 06/01/2009  . Paroxysmal supraventricular tachycardia ([place]) 06/01/2009  . Irritable bowel syndrome with constipation and diarrhea 06/16/2008  . Diarrhea, functional 06/16/2008  . Diarrhea, unspecified 06/16/2008    Jerl Mina ,PT, DPT, E-RYT  02/22/2017, 3:21 PM  [place] MAIN [facility] SERVICES [address] [place], [place], [address] Phone: [phone]   Fax:  [phone]  Name: WELTHA CATHY MRN: [id number] Date of Birth: October 24, 1929

## 2017-02-25 ENCOUNTER — Telehealth: Payer: Self-pay

## 2017-02-25 DIAGNOSIS — F0280 Dementia in other diseases classified elsewhere without behavioral disturbance: Secondary | ICD-10-CM | POA: Diagnosis not present

## 2017-02-25 DIAGNOSIS — E119 Type 2 diabetes mellitus without complications: Secondary | ICD-10-CM | POA: Diagnosis not present

## 2017-02-25 DIAGNOSIS — R413 Other amnesia: Secondary | ICD-10-CM | POA: Diagnosis not present

## 2017-02-25 DIAGNOSIS — G309 Alzheimer's disease, unspecified: Secondary | ICD-10-CM | POA: Diagnosis not present

## 2017-02-25 NOTE — Telephone Encounter (Signed)
Pt is wondering do you still want to hold off on metformin due to her kidney issues.

## 2017-02-25 NOTE — Telephone Encounter (Signed)
Stay off of metformin for now Just watch diet and monitor sugars Come in on or just after April 06, 2017 for next A1c, diabetes visit Call before then if sugars trending up, over 180 fasting

## 2017-02-26 NOTE — Telephone Encounter (Signed)
Left a detail messaged  

## 2017-02-27 ENCOUNTER — Telehealth: Payer: Self-pay | Admitting: Family Medicine

## 2017-02-27 NOTE — Telephone Encounter (Signed)
[facility] pharmacy called needing a call back. [phone]

## 2017-02-27 NOTE — Telephone Encounter (Signed)
Pharmacy had a question regarding pt metformin. Informed  pharmacist Dr. lada is stopping this for right now due to medical issues and the pt and daughter has been informed.

## 2017-02-28 DIAGNOSIS — H01003 Unspecified blepharitis right eye, unspecified eyelid: Secondary | ICD-10-CM | POA: Diagnosis not present

## 2017-03-07 ENCOUNTER — Telehealth: Payer: Self-pay | Admitting: Gastroenterology

## 2017-03-07 DIAGNOSIS — G309 Alzheimer's disease, unspecified: Secondary | ICD-10-CM | POA: Diagnosis not present

## 2017-03-07 DIAGNOSIS — R413 Other amnesia: Secondary | ICD-10-CM | POA: Diagnosis not present

## 2017-03-07 DIAGNOSIS — F0280 Dementia in other diseases classified elsewhere without behavioral disturbance: Secondary | ICD-10-CM | POA: Diagnosis not present

## 2017-03-07 DIAGNOSIS — F329 Major depressive disorder, single episode, unspecified: Secondary | ICD-10-CM | POA: Diagnosis not present

## 2017-03-07 DIAGNOSIS — F0150 Vascular dementia without behavioral disturbance: Secondary | ICD-10-CM | POA: Diagnosis not present

## 2017-03-07 DIAGNOSIS — E538 Deficiency of other specified B group vitamins: Secondary | ICD-10-CM | POA: Diagnosis not present

## 2017-03-07 DIAGNOSIS — E119 Type 2 diabetes mellitus without complications: Secondary | ICD-10-CM | POA: Diagnosis not present

## 2017-03-07 DIAGNOSIS — G35 Multiple sclerosis: Secondary | ICD-10-CM | POA: Diagnosis not present

## 2017-03-07 DIAGNOSIS — I10 Essential (primary) hypertension: Secondary | ICD-10-CM | POA: Diagnosis not present

## 2017-03-07 NOTE — Telephone Encounter (Signed)
Patient needs to reschedule a procedure

## 2017-03-11 ENCOUNTER — Other Ambulatory Visit: Payer: Self-pay

## 2017-03-11 ENCOUNTER — Telehealth: Payer: Self-pay

## 2017-03-11 DIAGNOSIS — K228 Other specified diseases of esophagus: Secondary | ICD-10-CM

## 2017-03-11 DIAGNOSIS — K2289 Other specified disease of esophagus: Secondary | ICD-10-CM

## 2017-03-11 NOTE — Telephone Encounter (Signed)
Discussed with Dr. Vicente Males he ok'd doing EGD on 04/01/17 Monday since pt will be going out of town September 1st for wedding. Instructions will be mailed to pt.

## 2017-03-14 ENCOUNTER — Encounter: Payer: Medicare HMO | Admitting: Physical Therapy

## 2017-03-14 DIAGNOSIS — G309 Alzheimer's disease, unspecified: Secondary | ICD-10-CM | POA: Diagnosis not present

## 2017-03-14 DIAGNOSIS — I10 Essential (primary) hypertension: Secondary | ICD-10-CM | POA: Diagnosis not present

## 2017-03-14 DIAGNOSIS — E538 Deficiency of other specified B group vitamins: Secondary | ICD-10-CM | POA: Diagnosis not present

## 2017-03-14 DIAGNOSIS — G35 Multiple sclerosis: Secondary | ICD-10-CM | POA: Diagnosis not present

## 2017-03-14 DIAGNOSIS — F0150 Vascular dementia without behavioral disturbance: Secondary | ICD-10-CM | POA: Diagnosis not present

## 2017-03-14 DIAGNOSIS — E119 Type 2 diabetes mellitus without complications: Secondary | ICD-10-CM | POA: Diagnosis not present

## 2017-03-14 DIAGNOSIS — F329 Major depressive disorder, single episode, unspecified: Secondary | ICD-10-CM | POA: Diagnosis not present

## 2017-03-14 DIAGNOSIS — F0280 Dementia in other diseases classified elsewhere without behavioral disturbance: Secondary | ICD-10-CM | POA: Diagnosis not present

## 2017-03-14 DIAGNOSIS — R413 Other amnesia: Secondary | ICD-10-CM | POA: Diagnosis not present

## 2017-03-19 ENCOUNTER — Other Ambulatory Visit: Payer: Self-pay | Admitting: Family Medicine

## 2017-03-19 DIAGNOSIS — F0280 Dementia in other diseases classified elsewhere without behavioral disturbance: Secondary | ICD-10-CM | POA: Diagnosis not present

## 2017-03-19 DIAGNOSIS — F329 Major depressive disorder, single episode, unspecified: Secondary | ICD-10-CM | POA: Diagnosis not present

## 2017-03-19 DIAGNOSIS — R413 Other amnesia: Secondary | ICD-10-CM | POA: Diagnosis not present

## 2017-03-19 DIAGNOSIS — G35 Multiple sclerosis: Secondary | ICD-10-CM | POA: Diagnosis not present

## 2017-03-19 DIAGNOSIS — E538 Deficiency of other specified B group vitamins: Secondary | ICD-10-CM | POA: Diagnosis not present

## 2017-03-19 DIAGNOSIS — E119 Type 2 diabetes mellitus without complications: Secondary | ICD-10-CM | POA: Diagnosis not present

## 2017-03-19 DIAGNOSIS — I10 Essential (primary) hypertension: Secondary | ICD-10-CM | POA: Diagnosis not present

## 2017-03-19 DIAGNOSIS — F0150 Vascular dementia without behavioral disturbance: Secondary | ICD-10-CM | POA: Diagnosis not present

## 2017-03-19 DIAGNOSIS — G309 Alzheimer's disease, unspecified: Secondary | ICD-10-CM | POA: Diagnosis not present

## 2017-03-19 NOTE — Telephone Encounter (Signed)
Last sgpt and lipids reviewed; Rx approved 

## 2017-03-20 DIAGNOSIS — H01003 Unspecified blepharitis right eye, unspecified eyelid: Secondary | ICD-10-CM | POA: Diagnosis not present

## 2017-03-23 ENCOUNTER — Other Ambulatory Visit: Payer: Self-pay | Admitting: Family Medicine

## 2017-03-23 DIAGNOSIS — F3341 Major depressive disorder, recurrent, in partial remission: Secondary | ICD-10-CM

## 2017-03-25 DIAGNOSIS — G309 Alzheimer's disease, unspecified: Secondary | ICD-10-CM | POA: Diagnosis not present

## 2017-03-25 DIAGNOSIS — F0280 Dementia in other diseases classified elsewhere without behavioral disturbance: Secondary | ICD-10-CM | POA: Diagnosis not present

## 2017-03-25 DIAGNOSIS — G35 Multiple sclerosis: Secondary | ICD-10-CM | POA: Diagnosis not present

## 2017-03-25 DIAGNOSIS — I10 Essential (primary) hypertension: Secondary | ICD-10-CM | POA: Diagnosis not present

## 2017-03-25 DIAGNOSIS — F0150 Vascular dementia without behavioral disturbance: Secondary | ICD-10-CM | POA: Diagnosis not present

## 2017-03-25 DIAGNOSIS — E538 Deficiency of other specified B group vitamins: Secondary | ICD-10-CM | POA: Diagnosis not present

## 2017-03-25 DIAGNOSIS — R413 Other amnesia: Secondary | ICD-10-CM | POA: Diagnosis not present

## 2017-03-25 DIAGNOSIS — E119 Type 2 diabetes mellitus without complications: Secondary | ICD-10-CM | POA: Diagnosis not present

## 2017-03-25 DIAGNOSIS — F329 Major depressive disorder, single episode, unspecified: Secondary | ICD-10-CM | POA: Diagnosis not present

## 2017-03-28 ENCOUNTER — Encounter: Payer: Medicare HMO | Admitting: Physical Therapy

## 2017-03-31 ENCOUNTER — Ambulatory Visit: Admit: 2017-03-31 | Payer: Medicare HMO | Admitting: Gastroenterology

## 2017-03-31 SURGERY — EGD (ESOPHAGOGASTRODUODENOSCOPY)
Anesthesia: General

## 2017-04-01 ENCOUNTER — Ambulatory Visit: Payer: Medicare HMO | Admitting: Certified Registered Nurse Anesthetist

## 2017-04-01 ENCOUNTER — Ambulatory Visit
Admission: RE | Admit: 2017-04-01 | Discharge: 2017-04-01 | Disposition: A | Discharge status: Home / Self Care | Payer: Medicare HMO | Source: Ambulatory Visit | Attending: Gastroenterology | Admitting: Gastroenterology

## 2017-04-01 ENCOUNTER — Encounter
Admission: RE | Disposition: A | Discharge status: Home / Self Care | Payer: Self-pay | Source: Ambulatory Visit | Attending: Gastroenterology

## 2017-04-01 ENCOUNTER — Encounter: Payer: Self-pay | Admitting: Certified Registered Nurse Anesthetist

## 2017-04-01 DIAGNOSIS — Z79899 Other long term (current) drug therapy: Secondary | ICD-10-CM | POA: Insufficient documentation

## 2017-04-01 DIAGNOSIS — G35 Multiple sclerosis: Secondary | ICD-10-CM | POA: Insufficient documentation

## 2017-04-01 DIAGNOSIS — Z8673 Personal history of transient ischemic attack (TIA), and cerebral infarction without residual deficits: Secondary | ICD-10-CM | POA: Insufficient documentation

## 2017-04-01 DIAGNOSIS — K228 Other specified diseases of esophagus: Secondary | ICD-10-CM

## 2017-04-01 DIAGNOSIS — K222 Esophageal obstruction: Secondary | ICD-10-CM

## 2017-04-01 DIAGNOSIS — E7800 Pure hypercholesterolemia, unspecified: Secondary | ICD-10-CM | POA: Insufficient documentation

## 2017-04-01 DIAGNOSIS — Z923 Personal history of irradiation: Secondary | ICD-10-CM | POA: Diagnosis not present

## 2017-04-01 DIAGNOSIS — M1990 Unspecified osteoarthritis, unspecified site: Secondary | ICD-10-CM | POA: Diagnosis not present

## 2017-04-01 DIAGNOSIS — K589 Irritable bowel syndrome without diarrhea: Secondary | ICD-10-CM | POA: Insufficient documentation

## 2017-04-01 DIAGNOSIS — Z853 Personal history of malignant neoplasm of breast: Secondary | ICD-10-CM | POA: Insufficient documentation

## 2017-04-01 DIAGNOSIS — E46 Unspecified protein-calorie malnutrition: Secondary | ICD-10-CM | POA: Diagnosis not present

## 2017-04-01 DIAGNOSIS — K2980 Duodenitis without bleeding: Secondary | ICD-10-CM | POA: Diagnosis not present

## 2017-04-01 DIAGNOSIS — R1310 Dysphagia, unspecified: Secondary | ICD-10-CM | POA: Insufficient documentation

## 2017-04-01 DIAGNOSIS — Z9049 Acquired absence of other specified parts of digestive tract: Secondary | ICD-10-CM | POA: Insufficient documentation

## 2017-04-01 DIAGNOSIS — Z9181 History of falling: Secondary | ICD-10-CM | POA: Insufficient documentation

## 2017-04-01 DIAGNOSIS — G8929 Other chronic pain: Secondary | ICD-10-CM | POA: Diagnosis not present

## 2017-04-01 DIAGNOSIS — E1140 Type 2 diabetes mellitus with diabetic neuropathy, unspecified: Secondary | ICD-10-CM | POA: Insufficient documentation

## 2017-04-01 DIAGNOSIS — I471 Supraventricular tachycardia: Secondary | ICD-10-CM | POA: Diagnosis not present

## 2017-04-01 DIAGNOSIS — Z85828 Personal history of other malignant neoplasm of skin: Secondary | ICD-10-CM | POA: Insufficient documentation

## 2017-04-01 DIAGNOSIS — F329 Major depressive disorder, single episode, unspecified: Secondary | ICD-10-CM | POA: Diagnosis not present

## 2017-04-01 DIAGNOSIS — N3010 Interstitial cystitis (chronic) without hematuria: Secondary | ICD-10-CM | POA: Diagnosis not present

## 2017-04-01 DIAGNOSIS — E041 Nontoxic single thyroid nodule: Secondary | ICD-10-CM | POA: Insufficient documentation

## 2017-04-01 DIAGNOSIS — K317 Polyp of stomach and duodenum: Secondary | ICD-10-CM | POA: Diagnosis not present

## 2017-04-01 DIAGNOSIS — E119 Type 2 diabetes mellitus without complications: Secondary | ICD-10-CM | POA: Diagnosis not present

## 2017-04-01 DIAGNOSIS — M797 Fibromyalgia: Secondary | ICD-10-CM | POA: Diagnosis not present

## 2017-04-01 DIAGNOSIS — R42 Dizziness and giddiness: Secondary | ICD-10-CM | POA: Insufficient documentation

## 2017-04-01 DIAGNOSIS — Z87440 Personal history of urinary (tract) infections: Secondary | ICD-10-CM | POA: Insufficient documentation

## 2017-04-01 DIAGNOSIS — M25551 Pain in right hip: Secondary | ICD-10-CM | POA: Diagnosis not present

## 2017-04-01 DIAGNOSIS — Z7982 Long term (current) use of aspirin: Secondary | ICD-10-CM | POA: Insufficient documentation

## 2017-04-01 DIAGNOSIS — K210 Gastro-esophageal reflux disease with esophagitis: Secondary | ICD-10-CM | POA: Diagnosis not present

## 2017-04-01 DIAGNOSIS — I10 Essential (primary) hypertension: Secondary | ICD-10-CM | POA: Diagnosis not present

## 2017-04-01 DIAGNOSIS — E785 Hyperlipidemia, unspecified: Secondary | ICD-10-CM | POA: Insufficient documentation

## 2017-04-01 DIAGNOSIS — Z6827 Body mass index (BMI) 27.0-27.9, adult: Secondary | ICD-10-CM | POA: Insufficient documentation

## 2017-04-01 DIAGNOSIS — G5600 Carpal tunnel syndrome, unspecified upper limb: Secondary | ICD-10-CM | POA: Insufficient documentation

## 2017-04-01 DIAGNOSIS — E538 Deficiency of other specified B group vitamins: Secondary | ICD-10-CM | POA: Insufficient documentation

## 2017-04-01 DIAGNOSIS — K2289 Other specified disease of esophagus: Secondary | ICD-10-CM

## 2017-04-01 DIAGNOSIS — Z90710 Acquired absence of both cervix and uterus: Secondary | ICD-10-CM | POA: Insufficient documentation

## 2017-04-01 DIAGNOSIS — F419 Anxiety disorder, unspecified: Secondary | ICD-10-CM | POA: Insufficient documentation

## 2017-04-01 DIAGNOSIS — K219 Gastro-esophageal reflux disease without esophagitis: Secondary | ICD-10-CM | POA: Diagnosis not present

## 2017-04-01 DIAGNOSIS — I2510 Atherosclerotic heart disease of native coronary artery without angina pectoris: Secondary | ICD-10-CM | POA: Insufficient documentation

## 2017-04-01 HISTORY — PX: ESOPHAGOGASTRODUODENOSCOPY (EGD) WITH PROPOFOL: SHX5813

## 2017-04-01 SURGERY — ESOPHAGOGASTRODUODENOSCOPY (EGD) WITH PROPOFOL
Anesthesia: General

## 2017-04-01 MED ORDER — SODIUM CHLORIDE 0.9 % IV SOLN
INTRAVENOUS | Status: DC
  Administered 2017-04-01: 08:00:00 via INTRAVENOUS

## 2017-04-01 MED ORDER — PROPOFOL 10 MG/ML IV BOLUS
INTRAVENOUS | Status: DC | PRN
  Administered 2017-04-01: 30 mg via INTRAVENOUS

## 2017-04-01 MED ORDER — PROPOFOL 500 MG/50ML IV EMUL
INTRAVENOUS | Status: AC
  Filled 2017-04-01: qty 50

## 2017-04-01 MED ORDER — LIDOCAINE HCL (CARDIAC) 20 MG/ML IV SOLN
INTRAVENOUS | Status: DC | PRN
  Administered 2017-04-01: 60 mg via INTRAVENOUS

## 2017-04-01 MED ORDER — PROPOFOL 500 MG/50ML IV EMUL
INTRAVENOUS | Status: DC | PRN
  Administered 2017-04-01: 140 ug/kg/min via INTRAVENOUS

## 2017-04-01 MED ORDER — PHENYLEPHRINE HCL 10 MG/ML IJ SOLN
INTRAMUSCULAR | Status: AC
  Filled 2017-04-01: qty 1

## 2017-04-01 MED ORDER — LIDOCAINE HCL (PF) 2 % IJ SOLN
INTRAMUSCULAR | Status: AC
  Filled 2017-04-01: qty 2

## 2017-04-01 MED ORDER — EPHEDRINE SULFATE 50 MG/ML IJ SOLN
INTRAMUSCULAR | Status: AC
  Filled 2017-04-01: qty 1

## 2017-04-01 NOTE — Op Note (Signed)
[facility] Gastroenterology Patient Name: [name] Procedure Date: 04/01/2017 7:50 AM MRN: [id number] Account #: [id number] Date of Birth: 09-08-1929 Admit Type: Outpatient Age: 83 Room: [facility] ENDO ROOM 4 Gender: Female Note Status: Finalized Procedure:            Upper GI endoscopy Indications:          Dysphagia Providers:            Jonathon Bellows MD, MD Referring MD:         Arnetha Courser (Referring MD) Medicines:            Monitored Anesthesia Care Complications:        No immediate complications. Procedure:            Pre-Anesthesia Assessment:                       - Prior to the procedure, a History and Physical was                        performed, and patient medications, allergies and                        sensitivities were reviewed. The patient's tolerance of                        previous anesthesia was reviewed.                       - The risks and benefits of the procedure and the                        sedation options and risks were discussed with the                        patient. All questions were answered and informed                        consent was obtained.                       - ASA Grade Assessment: III - A patient with severe                        systemic disease.                       After obtaining informed consent, the endoscope was                        passed under direct vision. Throughout the procedure,                        the patient's blood pressure, pulse, and oxygen                        saturations were monitored continuously. The Endoscope                        was introduced through the mouth, and advanced to the  third part of duodenum. The upper GI endoscopy was                        accomplished with ease. The patient tolerated the                        procedure well. Findings:      The stomach was normal.      A mild Schatzki ring (acquired) was found at the  gastroesophageal       junction. A TTS dilator was passed through the scope. Dilation with a       15-16.5-18 mm balloon dilator was performed to 18 mm. The dilation site       was examined and showed no change.      Normal mucosa was found in the entire esophagus. This was biopsied with       a cold forceps for evaluation of eosinophilic esophagitis.      A single 6 mm sessile polyp was found in the duodenal bulb. The polyp       was removed with a cold biopsy forceps. Resection and retrieval were       complete. Impression:           - Normal stomach.                       - Mild Schatzki ring. Dilated.                       - Normal mucosa was found in the entire esophagus.                        Biopsied.                       - A single duodenal polyp. Resected and retrieved. Recommendation:       - Await pathology results.                       - Return to my office in 6 weeks.                       - Use Prilosec (omeprazole) 40 mg PO daily. Procedure Code(s):    --- Professional ---                       [phone], Esophagogastroduodenoscopy, flexible, transoral;                        with transendoscopic balloon dilation of esophagus                        (less than 30 mm diameter)                       43239, Esophagogastroduodenoscopy, flexible, transoral;                        with biopsy, single or multiple Diagnosis Code(s):    --- Professional ---                       K22.2, Esophageal obstruction  K31.7, Polyp of stomach and duodenum                       R13.10, Dysphagia, unspecified CPT copyright 2016 American Medical Association. All rights reserved. The codes documented in this report are preliminary and upon coder review may  be revised to meet current compliance requirements. Jonathon Bellows, MD Jonathon Bellows MD, MD 04/01/2017 8:17:17 AM This report has been signed electronically. Number of Addenda: 0 Note Initiated On: 04/01/2017 7:50 AM       [facility]

## 2017-04-01 NOTE — H&P (Signed)
Jonathon Bellows MD [address] [place], [place] [address] Phone: [phone] Fax : [phone]  Primary Care Physician:  Arnetha Courser, MD Primary Gastroenterologist:  Dr. Jonathon Bellows   Pre-Procedure History & Physical: HPI:  [name] is a 83 y.o. female is here for an endoscopy.   Past Medical History:  Diagnosis Date  . Anxiety and depression   . Breast cancer ([facility]) 2015   LT LUMPECTOMY  . Carpal tunnel syndrome   . Chest pain    SECONDARY TO GERD  . Chronic interstitial cystitis   . Chronic right hip pain   . Depression   . Diabetes mellitus without complication ([facility])   . Diverticulosis   . Dyslipidemia   . Essential hypertension, benign   . Fibromyalgia   . Frequent falls   . GERD (gastroesophageal reflux disease)   . Hearing loss of left ear   . History of fractured rib   . History of TIA (transient ischemic attack)   . Irritable bowel   . Lumbar arthropathy ([facility]) 10/04/2016   On CT scan, refer to ortho  . MS (multiple sclerosis) ([facility])   . Osteoarthritis   . Paroxysmal supraventricular tachycardia ([facility])   . Peripheral neuropathy    MILD  . Personal history of radiation therapy 2015   BREAST CA  . Protein malnutrition ([facility])   . Pure hypercholesterolemia   . Recurrent UTI   . Skin cancer   . SOB (shortness of breath)    SECONDARY TO REFLUX  . Solitary pulmonary nodule on lung CT 12/04/2015   3 mm LLL lung nodule June 2016, March 2017  . Tachycardia, paroxysmal ([facility])    Reportedly Paroxysmal Supraventricular Tachycardia, but unable to find documentation confirming this  . Thickening of esophagus 01/11/2017   Noted on CT scan June 2018  . Thyroid nodule   . Vertigo   . Vitamin B 12 deficiency     Past Surgical History:  Procedure Laterality Date  . APPENDECTOMY    . AUGMENTATION MAMMAPLASTY Right 1975   BREAST BREAST ONLY  . BLADDER TACKING     X 3  . BREAST LUMPECTOMY    . BREAST SURGERY    . CARDIAC CATHETERIZATION  10/2001   Normal  coronary arteries with the exception of 20% proximal D1  . CHOLECYSTECTOMY    . COLONOSCOPY WITH PROPOFOL N/A 01/21/2015   Procedure: COLONOSCOPY WITH PROPOFOL;  Surgeon: Josefine Class, MD;  Location: [facility] ENDOSCOPY;  Service: Endoscopy;  Laterality: N/A;  . ESOPHAGOGASTRODUODENOSCOPY N/A 01/21/2015   Procedure: ESOPHAGOGASTRODUODENOSCOPY (EGD);  Surgeon: Josefine Class, MD;  Location: [facility] ENDOSCOPY;  Service: Endoscopy;  Laterality: N/A;  . EYE SURGERIES     WITH BUCKLE DETACHMENT OF THE RETINA  . heart monitor     Not being used... Patient can not have a MRI  . HEMORRHOID SURGERY     WITH RECONSTRUCTION  . MASTECTOMY Right 1970   SUBCUTANEOUS - NO BREAST CANCER  . TONSILLECTOMY    . TOTAL ABDOMINAL HYSTERECTOMY W/ BILATERAL SALPINGOOPHORECTOMY    . TYMPANOPLASTY      Prior to Admission medications   Medication Sig Start Date End Date Taking? Authorizing Provider  memantine (NAMENDA) 10 MG tablet Take 10 mg by mouth 2 (two) times daily.  08/11/16  Yes [provider]  acetaminophen (TYLENOL) 500 MG tablet Take by mouth.    [provider]  alfuzosin (UROXATRAL) 10 MG 24 hr tablet Take 1 tablet (10 mg total) by  mouth daily before supper. 01/15/17   Festus Aloe, MD  amLODipine (NORVASC) 2.5 MG tablet TAKE 1 TABLET (2.5 MG TOTAL) BY MOUTH DAILY. 10/29/16   Arnetha Courser, MD  aspirin EC 81 MG tablet Take 1 tablet (81 mg total) by mouth daily. 11/11/15   Arnetha Courser, MD  atorvastatin (LIPITOR) 40 MG tablet TAKE 1 TABLET BY MOUTH EVERYDAY AT BEDTIME 03/19/17   Lada, Satira Anis, MD  b complex vitamins tablet Take 1 tablet by mouth daily.    [provider]  Carboxymethylcell-Hypromellose (GENTEAL OP) Apply 1 drop to eye. Each eye as needed    [provider]  Cyanocobalamin (VITAMIN B-12) 1000 MCG SUBL  09/16/15   [provider]  fluticasone (FLONASE) 50 MCG/ACT nasal spray SPRAY 2 SPRAYS EACH INTO BOTH NOSTRILS ONCE DAILY EACH NIGHT.  10/19/15   [provider]  hydrocortisone (PROCTO-PAK) 1 % CREA Apply to rectum twice a day 09/03/16   Arnetha Courser, MD  meclizine (ANTIVERT) 25 MG tablet Take 1 tablet (25 mg total) by mouth every 8 (eight) hours as needed for dizziness. 07/31/16   Lada, Satira Anis, MD  methylPREDNISolone (MEDROL DOSEPAK) 4 MG TBPK tablet TAKE 6 TABLETS ON DAY 1 AS DIRECTED ON PACKAGE AND DECREASE BY 1 TAB EACH DAY FOR A TOTAL OF 6 DAYS 01/08/17   [provider]  omeprazole (PRILOSEC) 20 MG capsule Take 1 capsule (20 mg total) by mouth daily. 02/19/17 04/20/17  Jonathon Bellows, MD  ondansetron (ZOFRAN) 4 MG tablet Take 4 mg by mouth every 8 (eight) hours as needed for nausea or vomiting. Reported on 12/22/2015    [provider]  oxyCODONE-acetaminophen (ROXICET) 5-325 MG tablet Take 1 tablet by mouth every 6 (six) hours as needed. Patient not taking: Reported on 02/19/2017 01/09/17 01/09/18  Darel Hong, MD  Probiotic Product (PROBIOTIC COLON SUPPORT) CAPS Take 1 capsule by mouth daily.     [provider]  sertraline (ZOLOFT) 50 MG tablet TAKE 1 TABLET (50 MG TOTAL) BY MOUTH DAILY. 03/25/17   Arnetha Courser, MD  trimethoprim (TRIMPEX) 100 MG tablet TAKE 1 TABLET (100 MG TOTAL) BY MOUTH DAILY. 01/21/17   Festus Aloe, MD    Allergies as of 03/11/2017 - Review Complete 02/19/2017  Allergen Reaction Noted  . Aricept [donepezil hcl] Other (See Comments) 04/27/2016  . Biaxin [clarithromycin] Other (See Comments) and Diarrhea 06/16/2008  . Copaxone [glatiramer acetate]  01/20/2015  . Decadron [dexamethasone] Nausea And Vomiting and Other (See Comments) 01/06/2015  . Dexamethasone sodium phosphate Other (See Comments) 07/20/2015  . Diltiazem hcl Other (See Comments)   . Diltiazem hcl Other (See Comments) 02/21/2015  . Flagyl [metronidazole] Nausea Only and Diarrhea 01/06/2015  . Gabapentin Other (See Comments)   . Iohexol Swelling and Other (See Comments) 08/22/2008  . Ivp dye  [iodinated diagnostic agents] Swelling and Other (See Comments) 01/06/2015  . Levothyroxine Nausea Only 01/13/2014  . Macrobid [nitrofurantoin macrocrystal] Nausea And Vomiting 08/30/2016  . Sulfa antibiotics Other (See Comments) 11/24/2015  . Sulfonamide derivatives Other (See Comments)   . Synthroid [levothyroxine sodium]  01/20/2015  . Copaxone  [glatiramer] Rash 02/21/2015  . Interferon beta-1a Other (See Comments) and Rash     Family History  Problem Relation Age of Onset  . ALS Mother   . Kidney disease Sister   . Arthritis Sister   . Aneurysm Brother   . Heart disease Daughter   . Colon cancer Unknown   . Breast cancer Neg Hx   .  Bladder Cancer Neg Hx   . Prostate cancer Neg Hx     Social History   Social History  . Marital status: Married    Spouse name: Marcello Moores  . Number of children: 4  . Years of education: N/A   Occupational History  . 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 Smoking status: Never Smoker  . Smokeless tobacco: Never Used  . Alcohol use No  . Drug use: No  . Sexual activity: No   Other Topics Concern  . Not on file   Social History Narrative   1 SON, 3 DAUGHTER'S   15 GRANDCHILDREN   LIVING AT [facility]    Review of Systems: See HPI, otherwise negative ROS  Physical Exam: BP (!) 144/64   Pulse 74   Temp (!) 97.1 F (36.2 C) (Tympanic)   Resp 18   Ht 5\' 2"  (1.575 m)   Wt 150 lb (68 kg)   SpO2 98%   BMI 27.44 kg/m  General:   Alert,  pleasant and cooperative in NAD Head:  Normocephalic and atraumatic. Neck:  Supple; no masses or thyromegaly. Lungs:  Clear throughout to auscultation.    Heart:  Regular rate and rhythm. Abdomen:  Soft, nontender and nondistended. Normal bowel sounds, without guarding, and without rebound.   Neurologic:  Alert and  oriented x4;  grossly normal neurologically.  Impression/Plan: [name] is here for an endoscopy to be performed for dysphagia     Risks, benefits, limitations, and alternatives regarding  endoscopy and possible dilation  have been reviewed with the patient.  Questions have been answered.  All parties agreeable.   Jonathon Bellows, MD  04/01/2017, 7:47 AM

## 2017-04-01 NOTE — Transfer of Care (Signed)
Immediate Anesthesia Transfer of Care Note  Patient: [name]  Procedure(s) Performed: Procedure(s): ESOPHAGOGASTRODUODENOSCOPY (EGD) WITH PROPOFOL WITH BANDING (N/A)  Patient Location: PACU  Anesthesia Type:General  Level of Consciousness: sedated  Airway & Oxygen Therapy: Patient Spontanous Breathing and Patient connected to nasal cannula oxygen  Post-op Assessment: Report given to RN and Post -op Vital signs reviewed and stable  Post vital signs: Reviewed and stable  Last Vitals:  Vitals:   04/01/17 0842 04/01/17 0852  BP: (!) 138/55 137/68  Pulse: 70 66  Resp: 19 17  Temp:    SpO2: 99% 98%    Last Pain:  Vitals:   04/01/17 0822  TempSrc: Tympanic  PainSc:          Complications: No apparent anesthesia complications

## 2017-04-01 NOTE — Anesthesia Postprocedure Evaluation (Signed)
Anesthesia Post Note  Patient: [name]  Procedure(s) Performed: Procedure(s) (LRB): ESOPHAGOGASTRODUODENOSCOPY (EGD) WITH PROPOFOL WITH BANDING (N/A)  Patient location during evaluation: PACU Anesthesia Type: General Level of consciousness: awake and alert and oriented Pain management: pain level controlled Vital Signs Assessment: post-procedure vital signs reviewed and stable Respiratory status: spontaneous breathing Cardiovascular status: blood pressure returned to baseline Anesthetic complications: no     Last Vitals:  Vitals:   04/01/17 0842 04/01/17 0852  BP: (!) 138/55 137/68  Pulse: 70 66  Resp: 19 17  Temp:    SpO2: 99% 98%    Last Pain:  Vitals:   04/01/17 0822  TempSrc: Tympanic  PainSc:                  Bleu Minerd

## 2017-04-01 NOTE — Anesthesia Preprocedure Evaluation (Addendum)
Anesthesia Evaluation  Patient identified by MRN, date of birth, ID band Patient awake    Reviewed: Allergy & Precautions, NPO status , Patient's Chart, lab work & pertinent test results  History of Anesthesia Complications (+) PONV  Airway Mallampati: II  TM Distance: >3 FB Neck ROM: Full    Dental  (+) Upper Dentures, Edentulous Lower   Pulmonary           Cardiovascular hypertension, + CAD  + dysrhythmias Supra Ventricular Tachycardia      Neuro/Psych PSYCHIATRIC DISORDERS Anxiety Depression TIA (r eye couldn't see) Neuromuscular disease (multiple scleorosis, weakness, falling)    GI/Hepatic GERD  Medicated,  Endo/Other  diabetes, Type 2, Oral Hypoglycemic Agents  Renal/GU Renal disease (interstitila cystitis)     Musculoskeletal  (+) Arthritis , Osteoarthritis,  Fibromyalgia -  Abdominal   Peds negative pediatric ROS (+)  Hematology   Anesthesia Other Findings   Reproductive/Obstetrics                             Anesthesia Physical  Anesthesia Plan  ASA: III  Anesthesia Plan: General   Post-op Pain Management:    Induction: Intravenous  PONV Risk Score and Plan:   Airway Management Planned: Nasal Cannula  Additional Equipment:   Intra-op Plan:   Post-operative Plan:   Informed Consent: I have reviewed the patients History and Physical, chart, labs and discussed the procedure including the risks, benefits and alternatives for the proposed anesthesia with the patient or authorized representative who has indicated his/her understanding and acceptance.     Plan Discussed with:   Anesthesia Plan Comments:         Anesthesia Quick Evaluation

## 2017-04-01 NOTE — Anesthesia Procedure Notes (Signed)
Date/Time: 04/01/2017 7:55 AM Performed by: Johnna Acosta Pre-anesthesia Checklist: Patient identified, Emergency Drugs available, Suction available, Patient being monitored and Timeout performed Patient Re-evaluated:Patient Re-evaluated prior to induction Oxygen Delivery Method: Nasal cannula Preoxygenation: Pre-oxygenation with 100% oxygen

## 2017-04-01 NOTE — Anesthesia Post-op Follow-up Note (Signed)
Anesthesia QCDR form completed.        

## 2017-04-02 ENCOUNTER — Encounter: Payer: Self-pay | Admitting: Gastroenterology

## 2017-04-02 LAB — SURGICAL PATHOLOGY

## 2017-04-05 ENCOUNTER — Ambulatory Visit: Payer: Medicare HMO | Admitting: Family Medicine

## 2017-04-10 ENCOUNTER — Other Ambulatory Visit: Payer: Self-pay | Admitting: Urology

## 2017-04-10 DIAGNOSIS — F0280 Dementia in other diseases classified elsewhere without behavioral disturbance: Secondary | ICD-10-CM | POA: Diagnosis not present

## 2017-04-10 DIAGNOSIS — E119 Type 2 diabetes mellitus without complications: Secondary | ICD-10-CM | POA: Diagnosis not present

## 2017-04-10 DIAGNOSIS — I10 Essential (primary) hypertension: Secondary | ICD-10-CM | POA: Diagnosis not present

## 2017-04-10 DIAGNOSIS — F0150 Vascular dementia without behavioral disturbance: Secondary | ICD-10-CM | POA: Diagnosis not present

## 2017-04-10 DIAGNOSIS — G309 Alzheimer's disease, unspecified: Secondary | ICD-10-CM | POA: Diagnosis not present

## 2017-04-10 DIAGNOSIS — E538 Deficiency of other specified B group vitamins: Secondary | ICD-10-CM | POA: Diagnosis not present

## 2017-04-10 DIAGNOSIS — R413 Other amnesia: Secondary | ICD-10-CM | POA: Diagnosis not present

## 2017-04-10 DIAGNOSIS — F329 Major depressive disorder, single episode, unspecified: Secondary | ICD-10-CM | POA: Diagnosis not present

## 2017-04-10 DIAGNOSIS — G35 Multiple sclerosis: Secondary | ICD-10-CM | POA: Diagnosis not present

## 2017-04-16 ENCOUNTER — Encounter: Payer: Medicare HMO | Admitting: Physical Therapy

## 2017-04-16 DIAGNOSIS — I10 Essential (primary) hypertension: Secondary | ICD-10-CM | POA: Diagnosis not present

## 2017-04-16 DIAGNOSIS — G35 Multiple sclerosis: Secondary | ICD-10-CM | POA: Diagnosis not present

## 2017-04-16 DIAGNOSIS — E538 Deficiency of other specified B group vitamins: Secondary | ICD-10-CM | POA: Diagnosis not present

## 2017-04-16 DIAGNOSIS — F0280 Dementia in other diseases classified elsewhere without behavioral disturbance: Secondary | ICD-10-CM | POA: Diagnosis not present

## 2017-04-16 DIAGNOSIS — F0150 Vascular dementia without behavioral disturbance: Secondary | ICD-10-CM | POA: Diagnosis not present

## 2017-04-16 DIAGNOSIS — E119 Type 2 diabetes mellitus without complications: Secondary | ICD-10-CM | POA: Diagnosis not present

## 2017-04-16 DIAGNOSIS — R413 Other amnesia: Secondary | ICD-10-CM | POA: Diagnosis not present

## 2017-04-16 DIAGNOSIS — G309 Alzheimer's disease, unspecified: Secondary | ICD-10-CM | POA: Diagnosis not present

## 2017-04-16 DIAGNOSIS — F329 Major depressive disorder, single episode, unspecified: Secondary | ICD-10-CM | POA: Diagnosis not present

## 2017-04-18 ENCOUNTER — Telehealth: Payer: Self-pay | Admitting: Gastroenterology

## 2017-04-18 NOTE — Telephone Encounter (Signed)
Rescheduled patient's appt for 9/17 from 10/8

## 2017-04-18 NOTE — Telephone Encounter (Signed)
Left voice message for patient to call and schedule a 6 week follow up appointment with Dr. Vicente Males

## 2017-04-18 NOTE — Telephone Encounter (Signed)
Patient has a follow up appt on 10/8 but wants to come in sooner or talk to you. She is having rectal pain. Please call her today.

## 2017-04-22 ENCOUNTER — Encounter (INDEPENDENT_AMBULATORY_CARE_PROVIDER_SITE_OTHER): Payer: Self-pay

## 2017-04-22 ENCOUNTER — Telehealth: Payer: Self-pay

## 2017-04-22 ENCOUNTER — Ambulatory Visit (INDEPENDENT_AMBULATORY_CARE_PROVIDER_SITE_OTHER): Payer: Medicare HMO | Admitting: Gastroenterology

## 2017-04-22 ENCOUNTER — Encounter: Payer: Self-pay | Admitting: Gastroenterology

## 2017-04-22 VITALS — BP 138/73 | HR 72 | Temp 98.6°F | Ht 62.0 in | Wt 152.4 lb

## 2017-04-22 DIAGNOSIS — R1310 Dysphagia, unspecified: Secondary | ICD-10-CM

## 2017-04-22 DIAGNOSIS — K602 Anal fissure, unspecified: Secondary | ICD-10-CM | POA: Diagnosis not present

## 2017-04-22 DIAGNOSIS — K219 Gastro-esophageal reflux disease without esophagitis: Secondary | ICD-10-CM

## 2017-04-22 MED ORDER — POLYETHYLENE GLYCOL 3350 17 GM/SCOOP PO POWD: ORAL | 3 refills | Status: AC

## 2017-04-22 MED ORDER — HYDROCORTISONE ACETATE 25 MG RE SUPP: 25.0000 mg | RECTAL | 0 refills | Status: AC

## 2017-04-22 NOTE — Progress Notes (Signed)
Jonathon Bellows MD, [facility]CP([facility]) [address]  [place]  [place], [place] [address]  Main: [phone]  Fax: [phone]   Primary Care Physician: Arnetha Courser, MD  Primary Gastroenterologist:  Dr. Jonathon Bellows   Chief Complaint  Patient presents with  . Follow-up  . Results    HPI: [name] is a 83 y.o. female   Summary of history : She is here today to follow up for dysphagia. She was previously under the care of GI at [facility] clinic who had dilated her esophagus in the past (schatzkis ring dilated to 18 mm in 2016 ). She has a history of GERD. Hb 13.4 on 01/09/17 . She was referred and seen by me on 02/19/17 for  a thickened esophagus seen on a CT scan of the abdomen . She had dysphagia for a few months on and off.    Interval history   02/19/2017-  04/22/2017   04/01/17 - EGD- Schatzkis ring dilated, small duodenal polyps excised which was benign. Esophageal biopsies showed features of reflux.   Since her dilation she says her swallowing is doing well . She has a bowel movement once in 3 days , followed by a watery bowel movement . Marland Kitchen This has been going on since before x mass. At times she notices the stool is very hard. She does not take a stool softner.  Current Outpatient Prescriptions  Medication Sig Dispense Refill  . acetaminophen (TYLENOL) 500 MG tablet Take by mouth.    Marland Kitchen alfuzosin (UROXATRAL) 10 MG 24 hr tablet Take 1 tablet (10 mg total) by mouth daily before supper. 7 tablet 0  . amLODipine (NORVASC) 2.5 MG tablet TAKE 1 TABLET (2.5 MG TOTAL) BY MOUTH DAILY. 30 tablet 11  . aspirin 81 MG tablet     . atorvastatin (LIPITOR) 40 MG tablet TAKE 1 TABLET BY MOUTH EVERYDAY AT BEDTIME 30 tablet 3  . b complex vitamins tablet Take 1 tablet by mouth daily.    . Carboxymethylcell-Hypromellose (GENTEAL OP) Apply 1 drop to eye. Each eye as needed    . [employer] MELATONIN 5 MG CAPS     . Cyanocobalamin (VITAMIN B-12) 1000 MCG SUBL     . fluticasone (FLONASE) 50 MCG/ACT  nasal spray SPRAY 2 SPRAYS EACH INTO BOTH NOSTRILS ONCE DAILY EACH NIGHT.  5  . hydrocortisone (PROCTO-PAK) 1 % CREA Apply to rectum twice a day 28.35 g 0  . Loratadine 10 MG CAPS     . meclizine (ANTIVERT) 25 MG tablet Take 1 tablet (25 mg total) by mouth every 8 (eight) hours as needed for dizziness. 30 tablet 2  . Melatonin 5 MG CAPS     . memantine (NAMENDA) 10 MG tablet Take 10 mg by mouth 2 (two) times daily.     . methylPREDNISolone (MEDROL DOSEPAK) 4 MG TBPK tablet TAKE 6 TABLETS ON DAY 1 AS DIRECTED ON PACKAGE AND DECREASE BY 1 TAB EACH DAY FOR A TOTAL OF 6 DAYS  0  . neomycin-polymyxin b-dexamethasone (MAXITROL) 3.5-10000-0.1 OINT PLACE SMALL RIBBON IN BOTH EYES 2 TIMES A DAY FOR 14 DAYS  0  . ondansetron (ZOFRAN) 4 MG tablet Take 4 mg by mouth every 8 (eight) hours as needed for nausea or vomiting. Reported on 12/22/2015    . oxyCODONE-acetaminophen (ROXICET) 5-325 MG tablet Take 1 tablet by mouth every 6 (six) hours as needed. 7 tablet 0  . Probiotic Product (PROBIOTIC COLON SUPPORT) CAPS Take 1 capsule by mouth daily.     Marland Kitchen  sertraline (ZOLOFT) 50 MG tablet TAKE 1 TABLET (50 MG TOTAL) BY MOUTH DAILY. 90 tablet 2  . trimethoprim (TRIMPEX) 100 MG tablet TAKE 1 TABLET (100 MG TOTAL) BY MOUTH DAILY. 90 tablet 0  . omeprazole (PRILOSEC) 20 MG capsule Take 1 capsule (20 mg total) by mouth daily. 30 capsule 1   No current facility-administered medications for this visit.     Allergies as of 04/22/2017 - Review Complete 04/22/2017  Allergen Reaction Noted  . Aricept [donepezil hcl] Other (See Comments) 04/27/2016  . Biaxin [clarithromycin] Other (See Comments) and Diarrhea 06/16/2008  . Copaxone [glatiramer acetate]  01/20/2015  . Decadron [dexamethasone] Nausea And Vomiting and Other (See Comments) 01/06/2015  . Dexamethasone sodium phosphate Other (See Comments) 07/20/2015  . Diltiazem hcl Other (See Comments)   . Diltiazem hcl Other (See Comments) 02/21/2015  . Flagyl [metronidazole]  Nausea Only and Diarrhea 01/06/2015  . Gabapentin Other (See Comments)   . Iohexol Swelling and Other (See Comments) 08/22/2008  . Ivp dye [iodinated diagnostic agents] Swelling and Other (See Comments) 01/06/2015  . Levothyroxine Nausea Only 01/13/2014  . Macrobid [nitrofurantoin macrocrystal] Nausea And Vomiting 08/30/2016  . Sulfa antibiotics Other (See Comments) 11/24/2015  . Sulfonamide derivatives Other (See Comments)   . Synthroid [levothyroxine sodium]  01/20/2015  . Copaxone  [glatiramer] Rash 02/21/2015  . Interferon beta-1a Other (See Comments) and Rash     ROS:  General: Negative for anorexia, weight loss, fever, chills, fatigue, weakness. ENT: Negative for hoarseness, difficulty swallowing , nasal congestion. CV: Negative for chest pain, angina, palpitations, dyspnea on exertion, peripheral edema.  Respiratory: Negative for dyspnea at rest, dyspnea on exertion, cough, sputum, wheezing.  GI: See history of present illness. GU:  Negative for dysuria, hematuria, urinary incontinence, urinary frequency, nocturnal urination.  Endo: Negative for unusual weight change.    Physical Examination:   BP 138/73 (BP Location: Right Arm, Patient Position: Sitting, Cuff Size: Normal)   Pulse 72   Temp 98.6 F (37 C) (Oral)   Ht 5\' 2"  (1.575 m)   Wt 152 lb 6.4 oz (69.1 kg)   BMI 27.87 kg/m   General: Well-nourished, well-developed in no acute distress.  Eyes: No icterus. Conjunctivae pink. Mouth: Oropharyngeal mucosa moist and pink , no lesions erythema or exudate. Lungs: Clear to auscultation bilaterally. Non-labored. Heart: Regular rate and rhythm, no murmurs rubs or gallops.  Abdomen: Bowel sounds are normal, nontender, nondistended, no hepatosplenomegaly or masses, no abdominal bruits or hernia , no rebound or guarding.   Extremities: No lower extremity edema. No clubbing or deformities. Neuro: Alert and oriented x 3.  Grossly intact. Skin: Warm and dry, no jaundice.     Psych: Alert and cooperative, normal mood and affect.   Rectal exam with Panya as my chaperone- externally - looks like posteriorly old scar tissue?? She recalls she had a fistula. On palpation she is tender over the posterior wall of her anus, no masses palpable   Imaging Studies: No results found.  Assessment and Plan:   [name] is a 83 y.o. y/o female here to follow up for dysphagia and reflux which are stable and improved. She has some constipation for a year, not tried any stool softners. She also complains of anal pain, exam findings suggestive of fissure. .    Plan  1. Counseled on life style changes, suggest to use PPI first thing in the morning on empty stomach and eat 30 minutes after. Advised on the use of a wedge pillow  at night , avoid meals for 2 hours prior to bed time. Discussed the risks and benefits of long term PPI use including but not limited to bone loss, chronic kidney disease, infections , low magnesium . Aim to use at the lowest dose for the shortest period of time    2. Constipation- suggest trial of colace daily.   3. Pain in anus - likely anal fissure- trial of daily miralax and anusol for 7 days.   Dr Jonathon Bellows  MD,MRCP [facility]) Follow up in 2 months

## 2017-04-22 NOTE — Telephone Encounter (Signed)
Advised patient of results per Dr. Vicente Males.   Biopsies shows features of reflux which is also the cause of her dysphagia. She also had some duodenitis that was healing - suggest avoid NSAIDS

## 2017-04-22 NOTE — Telephone Encounter (Signed)
-----   Message from Jonathon Bellows, MD sent at 04/21/2017  1:23 PM EDT ----- Biopsies shows features of reflux which is also the cause of her dysphagia. She also had some duodenitis that was healing - suggest avoid NSAIDS

## 2017-04-25 DIAGNOSIS — G309 Alzheimer's disease, unspecified: Secondary | ICD-10-CM | POA: Diagnosis not present

## 2017-04-25 DIAGNOSIS — F329 Major depressive disorder, single episode, unspecified: Secondary | ICD-10-CM | POA: Diagnosis not present

## 2017-04-25 DIAGNOSIS — E538 Deficiency of other specified B group vitamins: Secondary | ICD-10-CM | POA: Diagnosis not present

## 2017-04-25 DIAGNOSIS — E119 Type 2 diabetes mellitus without complications: Secondary | ICD-10-CM | POA: Diagnosis not present

## 2017-04-25 DIAGNOSIS — F0150 Vascular dementia without behavioral disturbance: Secondary | ICD-10-CM | POA: Diagnosis not present

## 2017-04-25 DIAGNOSIS — R413 Other amnesia: Secondary | ICD-10-CM | POA: Diagnosis not present

## 2017-04-25 DIAGNOSIS — F0280 Dementia in other diseases classified elsewhere without behavioral disturbance: Secondary | ICD-10-CM | POA: Diagnosis not present

## 2017-04-25 DIAGNOSIS — I10 Essential (primary) hypertension: Secondary | ICD-10-CM | POA: Diagnosis not present

## 2017-04-25 DIAGNOSIS — G35 Multiple sclerosis: Secondary | ICD-10-CM | POA: Diagnosis not present

## 2017-04-30 ENCOUNTER — Ambulatory Visit (INDEPENDENT_AMBULATORY_CARE_PROVIDER_SITE_OTHER): Payer: Medicare HMO | Admitting: Family Medicine

## 2017-04-30 ENCOUNTER — Encounter: Payer: Self-pay | Admitting: Family Medicine

## 2017-04-30 VITALS — BP 130/70 | HR 68 | Temp 99.0°F | Resp 14 | Wt 152.9 lb

## 2017-04-30 DIAGNOSIS — Z0000 Encounter for general adult medical examination without abnormal findings: Secondary | ICD-10-CM | POA: Insufficient documentation

## 2017-04-30 DIAGNOSIS — Z66 Do not resuscitate: Secondary | ICD-10-CM

## 2017-04-30 DIAGNOSIS — Z23 Encounter for immunization: Secondary | ICD-10-CM

## 2017-04-30 NOTE — Progress Notes (Signed)
Patient: [name], Female    DOB: [birth year], 83 y.o.   MRN: [id number]  Visit Date: 04/30/2017  Today's Provider: Enid Derry, MD   Chief Complaint  Patient presents with  . Medicare Wellness    Subjective:   [name] is a 83 y.o. female who presents today for her Subsequent Annual Wellness Visit.  USPSTF grade A and B recommendations Depression:  Depression screen [facility] 2/9 04/30/2017 01/11/2017 01/04/2017 11/26/2016 10/10/2016  Decreased Interest 0 0 0 0 0  Down, Depressed, Hopeless 0 1 1 0 0  PHQ - 2 Score 0 1 1 0 0  Altered sleeping - - - - -  Tired, decreased energy - - - - -  Change in appetite - - - - -  Feeling bad or failure about yourself  - - - - -  Trouble concentrating - - - - -  Moving slowly or fidgety/restless - - - - -  Suicidal thoughts - - - - -  PHQ-9 Score - - - - -  Difficult doing work/chores - - - - -  Some recent data might be hidden   Hypertension: BP Readings from Last 3 Encounters:  04/30/17 130/70  04/22/17 138/73  04/01/17 137/68   Obesity: Wt Readings from Last 3 Encounters:  04/30/17 152 lb 14.4 oz (69.4 kg)  04/22/17 152 lb 6.4 oz (69.1 kg)  04/01/17 150 lb (68 kg)   BMI Readings from Last 3 Encounters:  04/30/17 27.97 kg/m  04/22/17 27.87 kg/m  04/01/17 27.44 kg/m    Alcohol: n/a Tobacco use: n/a HIV, hep B, hep C: not interested STD testing and prevention (chl/gon/syphilis): n/a Intimate partner violence: no abuse Breast cancer: through Dr. Oren Bracket BRCA gene screening: n/a Cervical cancer screening: n/a Osteoporosis: order DEXA Fall prevention/vitamin D: using walker; goof fall precautions; 3 servings of calcium a day; taking vit D once a week 5000 iu Tylenol for arthritis Lipids: reivewed Lab Results  Component Value Date   CHOL 203 (H) 01/11/2017   CHOL 218 (H) 10/04/2016   CHOL 228 (H) 04/03/2016   Lab Results  Component Value Date   HDL 84 01/11/2017   HDL 76 10/04/2016   HDL 71 04/03/2016   Lab  Results  Component Value Date   LDLCALC 97 01/11/2017   LDLCALC 115 (H) 10/04/2016   LDLCALC 124 04/03/2016   Lab Results  Component Value Date   TRIG 110 01/11/2017   TRIG 137 10/04/2016   TRIG 167 (H) 04/03/2016   Lab Results  Component Value Date   CHOLHDL 2.4 01/11/2017   CHOLHDL 2.9 10/04/2016   CHOLHDL 3.2 04/03/2016   No results found for: LDLDIRECT Glucose:  Glucose  Date Value Ref Range Status  11/12/2014 116 (H) mg/dL Final    Comment:    65-99 NOTE: New Reference Range  10/12/14   08/26/2014 94 65 - 99 mg/dL Final  07/23/2014 105 (H) 65 - 99 mg/dL Final   Glucose, Bld  Date Value Ref Range Status  01/11/2017 185 (H) 65 - 99 mg/dL Final  01/09/2017 156 (H) 65 - 99 mg/dL Final  10/04/2016 96 65 - 99 mg/dL Final   Glucose-Capillary  Date Value Ref Range Status  01/21/2015 104 (H) 65 - 99 mg/dL Final  12/27/2009 155 (H) 70 - 99 mg/dL Final  12/27/2009 101 (H) 70 - 99 mg/dL Final   Colorectal cancer: older than 8 Lung cancer:  n/a AAA: n/a Aspirin: not taking, was told to not take Diet:  getting calcium and fruits and veggies Exercise: walks with walker, will try to walk more Skin cancer: lots of moles; nothing black or growing fast  Caregiver input:  Daughter is here; she has a heart monitor; it's not moving; does not see cardiologist now Seeing Dr. Vicente Males for rectal fissures Arthritis; will try tylenol  HPI  Review of Systems  Past Medical History:  Diagnosis Date  . Anxiety and depression   . Breast cancer ([place]) 2015   LT LUMPECTOMY  . Carpal tunnel syndrome   . Chest pain    SECONDARY TO GERD  . Chronic interstitial cystitis   . Chronic right hip pain   . Depression   . Diabetes mellitus without complication ([place])   . Diverticulosis   . Dyslipidemia   . Essential hypertension, benign   . Fibromyalgia   . Frequent falls   . GERD (gastroesophageal reflux disease)   . Hearing loss of left ear   . History of fractured rib   . History  of TIA (transient ischemic attack)   . Irritable bowel   . Lumbar arthropathy ([place]) 10/04/2016   On CT scan, refer to ortho  . MS (multiple sclerosis) ([place])   . Osteoarthritis   . Paroxysmal supraventricular tachycardia ([place])   . Peripheral neuropathy    MILD  . Personal history of radiation therapy 2015   BREAST CA  . Protein malnutrition ([place])   . Pure hypercholesterolemia   . Recurrent UTI   . Skin cancer   . SOB (shortness of breath)    SECONDARY TO REFLUX  . Solitary pulmonary nodule on lung CT 12/04/2015   3 mm LLL lung nodule June 2016, March 2017  . Tachycardia, paroxysmal ([facility])    Reportedly Paroxysmal Supraventricular Tachycardia, but unable to find documentation confirming this  . Thickening of esophagus 01/11/2017   Noted on CT scan June 2018  . Thyroid nodule   . Vertigo   . Vitamin B 12 deficiency     Past Surgical History:  Procedure Laterality Date  . APPENDECTOMY    . AUGMENTATION MAMMAPLASTY Right 1975   BREAST BREAST ONLY  . BLADDER TACKING     X 3  . BREAST LUMPECTOMY    . BREAST SURGERY    . CARDIAC CATHETERIZATION  10/2001   Normal coronary arteries with the exception of 20% proximal D1  . CHOLECYSTECTOMY    . COLONOSCOPY WITH PROPOFOL N/A 01/21/2015   Procedure: COLONOSCOPY WITH PROPOFOL;  Surgeon: Josefine Class, MD;  Location: [facility] ENDOSCOPY;  Service: Endoscopy;  Laterality: N/A;  . ESOPHAGOGASTRODUODENOSCOPY N/A 01/21/2015   Procedure: ESOPHAGOGASTRODUODENOSCOPY (EGD);  Surgeon: Josefine Class, MD;  Location[facility] ENDOSCOPY;  Service: Endoscopy;  Laterality: N/A;  . ESOPHAGOGASTRODUODENOSCOPY (EGD) WITH PROPOFOL N/A 04/01/2017   Procedure: ESOPHAGOGASTRODUODENOSCOPY (EGD) WITH PROPOFOL WITH BANDING;  Surgeon: Jonathon Bellows, MD;  Location: [facility] ENDOSCOPY;  Service: Gastroenterology;  Laterality: N/A;  . EYE SURGERIES     WITH BUCKLE DETACHMENT OF THE RETINA  . heart monitor     Not being used... Patient can not have a MRI  . HEMORRHOID  SURGERY     WITH RECONSTRUCTION  . MASTECTOMY Right 1970   SUBCUTANEOUS - NO BREAST CANCER  . TONSILLECTOMY    . TOTAL ABDOMINAL HYSTERECTOMY W/ BILATERAL SALPINGOOPHORECTOMY    . TYMPANOPLASTY      Family History  Problem Relation Age of Onset  . ALS Mother   . Kidney disease Sister   . Arthritis Sister   .  Aneurysm Brother   . Heart disease Daughter   . Colon cancer Unknown   . Breast cancer Neg Hx   . Bladder Cancer Neg Hx   . Prostate cancer Neg Hx     Social History   Social History  . Marital status: Married    Spouse name: Marcello Moores  . Number of children: 4  . Years of education: N/A   Occupational History  . 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 Smoking status: Never Smoker  . Smokeless tobacco: Never Used  . Alcohol use No  . Drug use: No  . Sexual activity: No   Other Topics Concern  . Not on file   Social History Narrative   1 SON, 3 DAUGHTER'S   15 GRANDCHILDREN   LIVING AT [place]    Outpatient Encounter Prescriptions as of 04/30/2017  Medication Sig Note  . acetaminophen (TYLENOL) 500 MG tablet Take 500 mg by mouth every 8 (eight) hours as needed.  12/29/2015: Received from: [place]  . amLODipine (NORVASC) 2.5 MG tablet TAKE 1 TABLET (2.5 MG TOTAL) BY MOUTH DAILY.   Marland Kitchen atorvastatin (LIPITOR) 40 MG tablet TAKE 1 TABLET BY MOUTH EVERYDAY AT BEDTIME   . Carboxymethylcell-Hypromellose (GENTEAL OP) Apply 1 drop to eye. Each eye as needed   . Cyanocobalamin (VITAMIN B-12) 1000 MCG SUBL  03/16/2016: Received from: [facility]  . fluticasone (FLONASE) 50 MCG/ACT nasal spray SPRAY 2 SPRAYS EACH INTO BOTH NOSTRILS ONCE prn 10/24/2015: Received from: External Pharmacy  . hydrocortisone (ANUSOL-HC) 25 MG suppository Place 1 suppository (25 mg total) rectally 1 day or 1 dose.   . hydrocortisone (PROCTO-PAK) 1 % CREA Apply to rectum twice a day 01/15/2017: PRN  . Loratadine 10 MG  CAPS    . meclizine (ANTIVERT) 25 MG tablet Take 1 tablet (25 mg total) by mouth every 8 (eight) hours as needed for dizziness.   . memantine (NAMENDA) 10 MG tablet Take 10 mg by mouth 2 (two) times daily.    Marland Kitchen neomycin-polymyxin b-dexamethasone (MAXITROL) 3.5-10000-0.1 OINT PLACE SMALL RIBBON IN BOTH EYES 2 TIMES A DAY FOR 14 DAYS   . ondansetron (ZOFRAN) 4 MG tablet Take 4 mg by mouth every 8 (eight) hours as needed for nausea or vomiting. Reported on 12/22/2015 01/15/2017: PRN  . oxyCODONE-acetaminophen (ROXICET) 5-325 MG tablet Take 1 tablet by mouth every 6 (six) hours as needed.   . polyethylene glycol powder (GLYCOLAX/MIRALAX) powder 17 grams daily   . Probiotic Product (PROBIOTIC COLON SUPPORT) CAPS Take 1 capsule by mouth daily.  08/15/2015: Takes 1 capsule by mouth daily at night  . sertraline (ZOLOFT) 50 MG tablet TAKE 1 TABLET (50 MG TOTAL) BY MOUTH DAILY.   Marland Kitchen alfuzosin (UROXATRAL) 10 MG 24 hr tablet Take 1 tablet (10 mg total) by mouth daily before supper. (Patient not taking: Reported on 04/30/2017)   . aspirin 81 MG tablet    . b complex vitamins tablet Take 1 tablet by mouth daily.   . [facility] MELATONIN 5 MG CAPS    . Melatonin 5 MG CAPS    . methylPREDNISolone (MEDROL DOSEPAK) 4 MG TBPK tablet TAKE 6 TABLETS ON DAY 1 AS DIRECTED ON PACKAGE AND DECREASE BY 1 TAB EACH DAY FOR A TOTAL OF 6 DAYS   . omeprazole (PRILOSEC) 20 MG capsule Take 1 capsule (20 mg total) by mouth daily.   Marland Kitchen trimethoprim (TRIMPEX) 100 MG tablet  TAKE 1 TABLET (100 MG TOTAL) BY MOUTH DAILY. (Patient not taking: Reported on 04/30/2017)    No facility-administered encounter medications on file as of 04/30/2017.     Functional Ability / Safety Screening 1.  Was the timed Get Up and Go test longer than 30 seconds?  no; will consider silver sneakers 2.  Does the patient need help with the phone, transportation, shopping,      preparing meals, housework, laundry, medications, or managing money?  yes, daughter takes care of  driving, medicines, money 3.  Does the patient's home have:  loose throw rugs in the hallway?   no      Grab bars in the bathroom? yes      Handrails on the stairs?   no stairs      Poor lighting?   no 4.  Has the patient noticed any hearing difficulties?   yes; she sees hearing specialist, already  Fall Risk Assessment See under rooming  Depression Screen See under rooming Depression screen [facility] 2/9 04/30/2017 01/11/2017 01/04/2017 11/26/2016 10/10/2016  Decreased Interest 0 0 0 0 0  Down, Depressed, Hopeless 0 1 1 0 0  PHQ - 2 Score 0 1 1 0 0  Altered sleeping - - - - -  Tired, decreased energy - - - - -  Change in appetite - - - - -  Feeling bad or failure about yourself  - - - - -  Trouble concentrating - - - - -  Moving slowly or fidgety/restless - - - - -  Suicidal thoughts - - - - -  PHQ-9 Score - - - - -  Difficult doing work/chores - - - - -  Some recent data might be hidden    Advanced Directives Does patient have a HCPOA?    yes If yes, name and contact information: daughter, [phone] Does patient have a living will or MOST form?  yes  DNAR  Objective:   Vitals: BP 130/70   Pulse 68   Temp 99 F (37.2 C) (Oral)   Resp 14   Wt 152 lb 14.4 oz (69.4 kg)   SpO2 98%   BMI 27.97 kg/m  Body mass index is 27.97 kg/m. No exam data present  Physical Exam Mood/affect:   Appearance:    6CIT Screen 04/30/2017 10/04/2016  What Year? 0 points 0 points  What month? 0 points 0 points  What time? 0 points 0 points  Count back from 20 0 points 0 points  Months in reverse 0 points 0 points  Repeat phrase 0 points 2 points  Total Score 0 2  Daughter says "she does some stupid things" They were talking about pizza and gets on the stove, fills a tiny pan with oil and gets it hot and kept putting french fries in it Daughter said she fries with oil  Assessment & Plan:     Annual Wellness Visit  Reviewed patient's Family Medical History Reviewed and updated list of  patient's medical providers Assessment of cognitive impairment was done Assessed patient's functional ability Established a written schedule for health screening [place] Completed and Reviewed  Exercise Activities and Dietary recommendations Goals    . Exercise 3x per week (30 min per time)          Try to walk more       Immunization History  Administered Date(s) Administered  . Influenza, High Dose Seasonal PF 04/15/2015, 04/03/2016, 04/30/2017  . Pneumococcal-Unspecified 06/22/2014  . Tdap  11/25/2014    Health Maintenance  Topic Date Due  . HEMOGLOBIN A1C  07/06/2017  . URINE MICROALBUMIN  10/04/2017  . FOOT EXAM  01/04/2018  . OPHTHALMOLOGY EXAM  03/06/2018  . TETANUS/TDAP  11/24/2024  . INFLUENZA VACCINE  Completed  . DEXA SCAN  Completed  . PNA vac Low Risk Adult  Addressed    Discussed health benefits of physical activity, and encouraged her to engage in regular exercise appropriate for her age and condition.   No orders of the defined types were placed in this encounter.   Current Outpatient Prescriptions:  .  acetaminophen (TYLENOL) 500 MG tablet, Take 500 mg by mouth every 8 (eight) hours as needed. , Disp: , Rfl:  .  amLODipine (NORVASC) 2.5 MG tablet, TAKE 1 TABLET (2.5 MG TOTAL) BY MOUTH DAILY., Disp: 30 tablet, Rfl: 11 .  atorvastatin (LIPITOR) 40 MG tablet, TAKE 1 TABLET BY MOUTH EVERYDAY AT BEDTIME, Disp: 30 tablet, Rfl: 3 .  Carboxymethylcell-Hypromellose (GENTEAL OP), Apply 1 drop to eye. Each eye as needed, Disp: , Rfl:  .  Cyanocobalamin (VITAMIN B-12) 1000 MCG SUBL, , Disp: , Rfl:  .  fluticasone (FLONASE) 50 MCG/ACT nasal spray, SPRAY 2 SPRAYS EACH INTO BOTH NOSTRILS ONCE prn, Disp: , Rfl: 5 .  hydrocortisone (ANUSOL-HC) 25 MG suppository, Place 1 suppository (25 mg total) rectally 1 day or 1 dose., Disp: 7 suppository, Rfl: 0 .  hydrocortisone (PROCTO-PAK) 1 % CREA, Apply to rectum twice a day, Disp: 28.35 g, Rfl: 0 .   Loratadine 10 MG CAPS, , Disp: , Rfl:  .  meclizine (ANTIVERT) 25 MG tablet, Take 1 tablet (25 mg total) by mouth every 8 (eight) hours as needed for dizziness., Disp: 30 tablet, Rfl: 2 .  memantine (NAMENDA) 10 MG tablet, Take 10 mg by mouth 2 (two) times daily. , Disp: , Rfl:  .  neomycin-polymyxin b-dexamethasone (MAXITROL) 3.5-10000-0.1 OINT, PLACE SMALL RIBBON IN BOTH EYES 2 TIMES A DAY FOR 14 DAYS, Disp: , Rfl: 0 .  ondansetron (ZOFRAN) 4 MG tablet, Take 4 mg by mouth every 8 (eight) hours as needed for nausea or vomiting. Reported on 12/22/2015, Disp: , Rfl:  .  oxyCODONE-acetaminophen (ROXICET) 5-325 MG tablet, Take 1 tablet by mouth every 6 (six) hours as needed., Disp: 7 tablet, Rfl: 0 .  polyethylene glycol powder (GLYCOLAX/MIRALAX) powder, 17 grams daily, Disp: 255 g, Rfl: 3 .  Probiotic Product (PROBIOTIC COLON SUPPORT) CAPS, Take 1 capsule by mouth daily. , Disp: , Rfl:  .  sertraline (ZOLOFT) 50 MG tablet, TAKE 1 TABLET (50 MG TOTAL) BY MOUTH DAILY., Disp: 90 tablet, Rfl: 2 .  alfuzosin (UROXATRAL) 10 MG 24 hr tablet, Take 1 tablet (10 mg total) by mouth daily before supper. (Patient not taking: Reported on 04/30/2017), Disp: 7 tablet, Rfl: 0 .  aspirin 81 MG tablet, , Disp: , Rfl:  .  b complex vitamins tablet, Take 1 tablet by mouth daily., Disp: , Rfl:  .  [facility] MELATONIN 5 MG CAPS, , Disp: , Rfl:  .  Melatonin 5 MG CAPS, , Disp: , Rfl:  .  methylPREDNISolone (MEDROL DOSEPAK) 4 MG TBPK tablet, TAKE 6 TABLETS ON DAY 1 AS DIRECTED ON PACKAGE AND DECREASE BY 1 TAB EACH DAY FOR A TOTAL OF 6 DAYS, Disp: , Rfl: 0 .  omeprazole (PRILOSEC) 20 MG capsule, Take 1 capsule (20 mg total) by mouth daily., Disp: 30 capsule, Rfl: 1 .  trimethoprim (TRIMPEX) 100 MG tablet, TAKE 1 TABLET (  100 MG TOTAL) BY MOUTH DAILY. (Patient not taking: Reported on 04/30/2017), Disp: 90 tablet, Rfl: 0 There are no discontinued medications.  Next Medicare Wellness Visit in 12+ months   Problem List Items Addressed  This Visit      Other   Preventative health care - Primary    USPSTF grade A and B recommendations reviewed with patient; age-appropriate recommendations, preventive care, screening tests, etc discussed and encouraged; healthy living encouraged; see AVS for patient education given to patient      DNAR (do not attempt resuscitation)    Discussed with patient and daughter; wishes to be DNAR       Other Visit Diagnoses    Needs flu shot       Relevant Orders   Flu vaccine HIGH DOSE PF (Fluzone High dose) (Completed)

## 2017-04-30 NOTE — Assessment & Plan Note (Signed)
Discussed with patient and daughter; wishes to be DNAR

## 2017-04-30 NOTE — Patient Instructions (Addendum)
Contact Dr. Olena Leatherwood office for your mammogram order Phone: [phone]; Fax: [phone]  Consider Shingrix, new shingles vaccine; you can get that at your pharmacy  Do not cook with oil Use timers or reminders if you do choose to cook Check smoke detector batteries regularly to make sure they work, and have fire extinguishers in the kitchen   Fall Prevention in the Home Falls can cause injuries. They can happen to people of all ages. There are many things you can do to make your home safe and to help prevent falls. What can I do on the outside of my home?  Regularly fix the edges of walkways and driveways and fix any cracks.  Remove anything that might make you trip as you walk through a door, such as a raised step or threshold.  Trim any bushes or trees on the path to your home.  Use bright outdoor lighting.  Clear any walking paths of anything that might make someone trip, such as rocks or tools.  Regularly check to see if handrails are loose or broken. Make sure that both sides of any steps have handrails.  Any raised decks and porches should have guardrails on the edges.  Have any leaves, snow, or ice cleared regularly.  Use sand or salt on walking paths during winter.  Clean up any spills in your garage right away. This includes oil or grease spills. What can I do in the bathroom?  Use night lights.  Install grab bars by the toilet and in the tub and shower. Do not use towel bars as grab bars.  Use non-skid mats or decals in the tub or shower.  If you need to sit down in the shower, use a plastic, non-slip stool.  Keep the floor dry. Clean up any water that spills on the floor as soon as it happens.  Remove soap buildup in the tub or shower regularly.  Attach bath mats securely with double-sided non-slip rug tape.  Do not have throw rugs and other things on the floor that can make you trip. What can I do in the bedroom?  Use night lights.  Make sure  that you have a light by your bed that is easy to reach.  Do not use any sheets or blankets that are too big for your bed. They should not hang down onto the floor.  Have a firm chair that has side arms. You can use this for support while you get dressed.  Do not have throw rugs and other things on the floor that can make you trip. What can I do in the kitchen?  Clean up any spills right away.  Avoid walking on wet floors.  Keep items that you use a lot in easy-to-reach places.  If you need to reach something above you, use a strong step stool that has a grab bar.  Keep electrical cords out of the way.  Do not use floor polish or wax that makes floors slippery. If you must use wax, use non-skid floor wax.  Do not have throw rugs and other things on the floor that can make you trip. What can I do with my stairs?  Do not leave any items on the stairs.  Make sure that there are handrails on both sides of the stairs and use them. Fix handrails that are broken or loose. Make sure that handrails are as long as the stairways.  Check any carpeting to make sure that it is firmly attached to the  stairs. Fix any carpet that is loose or worn.  Avoid having throw rugs at the top or bottom of the stairs. If you do have throw rugs, attach them to the floor with carpet tape.  Make sure that you have a light switch at the top of the stairs and the bottom of the stairs. If you do not have them, ask someone to add them for you. What else can I do to help prevent falls?  Wear shoes that: ? Do not have high heels. ? Have rubber bottoms. ? Are comfortable and fit you well. ? Are closed at the toe. Do not wear sandals.  If you use a stepladder: ? Make sure that it is fully opened. Do not climb a closed stepladder. ? Make sure that both sides of the stepladder are locked into place. ? Ask someone to hold it for you, if possible.  Clearly mark and make sure that you can see: ? Any grab bars or  handrails. ? First and last steps. ? Where the edge of each step is.  Use tools that help you move around (mobility aids) if they are needed. These include: ? Canes. ? Walkers. ? Scooters. ? Crutches.  Turn on the lights when you go into a dark area. Replace any light bulbs as soon as they burn out.  Set up your furniture so you have a clear path. Avoid moving your furniture around.  If any of your floors are uneven, fix them.  If there are any pets around you, be aware of where they are.  Review your medicines with your doctor. Some medicines can make you feel dizzy. This can increase your chance of falling. Ask your doctor what other things that you can do to help prevent falls. This information is not intended to replace advice given to you by your health care provider. Make sure you discuss any questions you have with your health care provider. Document Released: 05/19/2009 Document Revised: 12/29/2015 Document Reviewed: 08/27/2014 Elsevier Interactive Patient Education  2018 [employer] Maintenance for Postmenopausal Women Menopause is a normal process in which your reproductive ability comes to an end. This process happens gradually over a span of months to years, usually between the ages of 50 and 79. Menopause is complete when you have missed 12 consecutive menstrual periods. It is important to talk with your health care provider about some of the most common conditions that affect postmenopausal women, such as heart disease, cancer, and bone loss (osteoporosis). Adopting a healthy lifestyle and getting preventive care can help to promote your health and wellness. Those actions can also lower your chances of developing some of these common conditions. What should I know about menopause? During menopause, you may experience a number of symptoms, such as:  Moderate-to-severe hot flashes.  Night sweats.  Decrease in sex drive.  Mood  swings.  Headaches.  Tiredness.  Irritability.  Memory problems.  Insomnia.  Choosing to treat or not to treat menopausal changes is an individual decision that you make with your health care provider. What should I know about hormone replacement therapy and supplements? Hormone therapy products are effective for treating symptoms that are associated with menopause, such as hot flashes and night sweats. Hormone replacement carries certain risks, especially as you become older. If you are thinking about using estrogen or estrogen with progestin treatments, discuss the benefits and risks with your health care provider. What should I know about heart disease and stroke? Heart disease, heart  attack, and stroke become more likely as you age. This may be due, in part, to the hormonal changes that your body experiences during menopause. These can affect how your body processes dietary fats, triglycerides, and cholesterol. Heart attack and stroke are both medical emergencies. There are many things that you can do to help prevent heart disease and stroke:  Have your blood pressure checked at least every 1-2 years. High blood pressure causes heart disease and increases the risk of stroke.  If you are 10-19 years old, ask your health care provider if you should take aspirin to prevent a heart attack or a stroke.  Do not use any tobacco products, including cigarettes, chewing tobacco, or electronic cigarettes. If you need help quitting, ask your health care provider.  It is important to eat a healthy diet and maintain a healthy weight. ? Be sure to include plenty of vegetables, fruits, low-fat dairy products, and lean protein. ? Avoid eating foods that are high in solid fats, added sugars, or salt (sodium).  Get regular exercise. This is one of the most important things that you can do for your health. ? Try to exercise for at least 150 minutes each week. The type of exercise that you do should  increase your heart rate and make you sweat. This is known as moderate-intensity exercise. ? Try to do strengthening exercises at least twice each week. Do these in addition to the moderate-intensity exercise.  Know your numbers.Ask your health care provider to check your cholesterol and your blood glucose. Continue to have your blood tested as directed by your health care provider.  What should I know about cancer screening? There are several types of cancer. Take the following steps to reduce your risk and to catch any cancer development as early as possible. Breast Cancer  Practice breast self-awareness. ? This means understanding how your breasts normally appear and feel. ? It also means doing regular breast self-exams. Let your health care provider know about any changes, no matter how small.  If you are 58 or older, have a clinician do a breast exam (clinical breast exam or CBE) every year. Depending on your age, family history, and medical history, it may be recommended that you also have a yearly breast X-ray (mammogram).  If you have a family history of breast cancer, talk with your health care provider about genetic screening.  If you are at high risk for breast cancer, talk with your health care provider about having an MRI and a mammogram every year.  Breast cancer (BRCA) gene test is recommended for women who have family members with BRCA-related cancers. Results of the assessment will determine the need for genetic counseling and BRCA1 and for BRCA2 testing. BRCA-related cancers include these types: ? Breast. This occurs in males or females. ? Ovarian. ? Tubal. This may also be called fallopian tube cancer. ? Cancer of the abdominal or pelvic lining (peritoneal cancer). ? Prostate. ? Pancreatic.  Cervical, Uterine, and Ovarian Cancer Your health care provider may recommend that you be screened regularly for cancer of the pelvic organs. These include your ovaries, uterus,  and vagina. This screening involves a pelvic exam, which includes checking for microscopic changes to the surface of your cervix (Pap test).  For women ages 21-65, health care providers may recommend a pelvic exam and a Pap test every three years. For women ages 82-65, they may recommend the Pap test and pelvic exam, combined with testing for human papilloma virus (HPV), every  five years. Some types of HPV increase your risk of cervical cancer. Testing for HPV may also be done on women of any age who have unclear Pap test results.  Other health care providers may not recommend any screening for nonpregnant women who are considered low risk for pelvic cancer and have no symptoms. Ask your health care provider if a screening pelvic exam is right for you.  If you have had past treatment for cervical cancer or a condition that could lead to cancer, you need Pap tests and screening for cancer for at least 20 years after your treatment. If Pap tests have been discontinued for you, your risk factors (such as having a new sexual partner) need to be reassessed to determine if you should start having screenings again. Some women have medical problems that increase the chance of getting cervical cancer. In these cases, your health care provider may recommend that you have screening and Pap tests more often.  If you have a family history of uterine cancer or ovarian cancer, talk with your health care provider about genetic screening.  If you have vaginal bleeding after reaching menopause, tell your health care provider.  There are currently no reliable tests available to screen for ovarian cancer.  Lung Cancer Lung cancer screening is recommended for adults 18-41 years old who are at high risk for lung cancer because of a history of smoking. A yearly low-dose CT scan of the lungs is recommended if you:  Currently smoke.  Have a history of at least 30 pack-years of smoking and you currently smoke or have quit  within the past 15 years. A pack-year is smoking an average of one pack of cigarettes per day for one year.  Yearly screening should:  Continue until it has been 15 years since you quit.  Stop if you develop a health problem that would prevent you from having lung cancer treatment.  Colorectal Cancer  This type of cancer can be detected and can often be prevented.  Routine colorectal cancer screening usually begins at age 44 and continues through age 21.  If you have risk factors for colon cancer, your health care provider may recommend that you be screened at an earlier age.  If you have a family history of colorectal cancer, talk with your health care provider about genetic screening.  Your health care provider may also recommend using home test kits to check for hidden blood in your stool.  A small camera at the end of a tube can be used to examine your colon directly (sigmoidoscopy or colonoscopy). This is done to check for the earliest forms of colorectal cancer.  Direct examination of the colon should be repeated every 5-10 years until age 73. However, if early forms of precancerous polyps or small growths are found or if you have a family history or genetic risk for colorectal cancer, you may need to be screened more often.  Skin Cancer  Check your skin from head to toe regularly.  Monitor any moles. Be sure to tell your health care provider: ? About any new moles or changes in moles, especially if there is a change in a mole's shape or color. ? If you have a mole that is larger than the size of a pencil eraser.  If any of your family members has a history of skin cancer, especially at a young age, talk with your health care provider about genetic screening.  Always use sunscreen. Apply sunscreen liberally and repeatedly throughout  the day.  Whenever you are outside, protect yourself by wearing long sleeves, pants, a wide-brimmed hat, and sunglasses.  What should I know  about osteoporosis? Osteoporosis is a condition in which bone destruction happens more quickly than new bone creation. After menopause, you may be at an increased risk for osteoporosis. To help prevent osteoporosis or the bone fractures that can happen because of osteoporosis, the following is recommended:  If you are 78-56 years old, get at least 1,000 mg of calcium and at least 600 mg of vitamin D per day.  If you are older than age 52 but younger than age 80, get at least 1,200 mg of calcium and at least 600 mg of vitamin D per day.  If you are older than age 75, get at least 1,200 mg of calcium and at least 800 mg of vitamin D per day.  Smoking and excessive alcohol intake increase the risk of osteoporosis. Eat foods that are rich in calcium and vitamin D, and do weight-bearing exercises several times each week as directed by your health care provider. What should I know about how menopause affects my mental health? Depression may occur at any age, but it is more common as you become older. Common symptoms of depression include:  Low or sad mood.  Changes in sleep patterns.  Changes in appetite or eating patterns.  Feeling an overall lack of motivation or enjoyment of activities that you previously enjoyed.  Frequent crying spells.  Talk with your health care provider if you think that you are experiencing depression. What should I know about immunizations? It is important that you get and maintain your immunizations. These include:  Tetanus, diphtheria, and pertussis (Tdap) booster vaccine.  Influenza every year before the flu season begins.  Pneumonia vaccine.  Shingles vaccine.  Your health care provider may also recommend other immunizations. This information is not intended to replace advice given to you by your health care provider. Make sure you discuss any questions you have with your health care provider. Document Released: 09/14/2005 Document Revised: 02/10/2016  Document Reviewed: 04/26/2015 Elsevier Interactive Patient Education  2018 [employer].

## 2017-04-30 NOTE — Progress Notes (Signed)
BP 130/70   Pulse 68   Temp 99 F (37.2 C) (Oral)   Resp 14   Wt 152 lb 14.4 oz (69.4 kg)   SpO2 98%   BMI 27.97 kg/m    Subjective:    Patient ID: [name], female    DOB: 1929/08/10, 83 y.o.   MRN: [id number]  HPI: [name] is a 83 y.o. female  Chief Complaint  Patient presents with  . Medicare Wellness    HPI   Depression screen [facility] 2/9 04/30/2017 01/11/2017 01/04/2017 11/26/2016 10/10/2016  Decreased Interest 0 0 0 0 0  Down, Depressed, Hopeless 0 1 1 0 0  PHQ - 2 Score 0 1 1 0 0  Altered sleeping - - - - -  Tired, decreased energy - - - - -  Change in appetite - - - - -  Feeling bad or failure about yourself  - - - - -  Trouble concentrating - - - - -  Moving slowly or fidgety/restless - - - - -  Suicidal thoughts - - - - -  PHQ-9 Score - - - - -  Difficult doing work/chores - - - - -  Some recent data might be hidden    Relevant past medical, surgical, family and social history reviewed Past Medical History:  Diagnosis Date  . Anxiety and depression   . Breast cancer ([place]) 2015   LT LUMPECTOMY  . Carpal tunnel syndrome   . Chest pain    SECONDARY TO GERD  . Chronic interstitial cystitis   . Chronic right hip pain   . Depression   . Diabetes mellitus without complication ([place])   . Diverticulosis   . Dyslipidemia   . Essential hypertension, benign   . Fibromyalgia   . Frequent falls   . GERD (gastroesophageal reflux disease)   . Hearing loss of left ear   . History of fractured rib   . History of TIA (transient ischemic attack)   . Irritable bowel   . Lumbar arthropathy ([place]) 10/04/2016   On CT scan, refer to ortho  . MS (multiple sclerosis) ([place])   . Osteoarthritis   . Paroxysmal supraventricular tachycardia ([place])   . Peripheral neuropathy    MILD  . Personal history of radiation therapy 2015   BREAST CA  . Protein malnutrition ([place])   . Pure hypercholesterolemia   . Recurrent UTI   . Skin cancer   . SOB (shortness of breath)    SECONDARY TO REFLUX  . Solitary pulmonary nodule on lung CT 12/04/2015   3 mm LLL lung nodule June 2016, March 2017  . Tachycardia, paroxysmal ([facility])    Reportedly Paroxysmal Supraventricular Tachycardia, but unable to find documentation confirming this  . Thickening of esophagus 01/11/2017   Noted on CT scan June 2018  . Thyroid nodule   . Vertigo   . Vitamin B 12 deficiency    Past Surgical History:  Procedure Laterality Date  . APPENDECTOMY    . AUGMENTATION MAMMAPLASTY Right 1975   BREAST BREAST ONLY  . BLADDER TACKING     X 3  . BREAST LUMPECTOMY    . BREAST SURGERY    . CARDIAC CATHETERIZATION  10/2001   Normal coronary arteries with the exception of 20% proximal D1  . CHOLECYSTECTOMY    . COLONOSCOPY WITH PROPOFOL N/A 01/21/2015   Procedure: COLONOSCOPY WITH PROPOFOL;  Surgeon: Josefine Class, MD;  Location: [facility] ENDOSCOPY;  Service: Endoscopy;  Laterality: N/A;  .  ESOPHAGOGASTRODUODENOSCOPY N/A 01/21/2015   Procedure: ESOPHAGOGASTRODUODENOSCOPY (EGD);  Surgeon: Josefine Class, MD;  Location: [facility] ENDOSCOPY;  Service: Endoscopy;  Laterality: N/A;  . ESOPHAGOGASTRODUODENOSCOPY (EGD) WITH PROPOFOL N/A 04/01/2017   Procedure: ESOPHAGOGASTRODUODENOSCOPY (EGD) WITH PROPOFOL WITH BANDING;  Surgeon: Jonathon Bellows, MD;  Location: [facility] ENDOSCOPY;  Service: Gastroenterology;  Laterality: N/A;  . EYE SURGERIES     WITH BUCKLE DETACHMENT OF THE RETINA  . heart monitor     Not being used... Patient can not have a MRI  . HEMORRHOID SURGERY     WITH RECONSTRUCTION  . MASTECTOMY Right 1970   SUBCUTANEOUS - NO BREAST CANCER  . TONSILLECTOMY    . TOTAL ABDOMINAL HYSTERECTOMY W/ BILATERAL SALPINGOOPHORECTOMY    . TYMPANOPLASTY     Family History  Problem Relation Age of Onset  . ALS Mother   . Kidney disease Sister   . Arthritis Sister   . Aneurysm Brother   . Heart disease Daughter   . Colon cancer Unknown   . Breast cancer Neg Hx   . Bladder Cancer Neg Hx   . Prostate cancer  Neg Hx    Social History   Social History  . Marital status: Married    Spouse name: Marcello Moores  . Number of children: 4  . Years of education: N/A   Occupational History  . 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 Smoking status: Never Smoker  . Smokeless tobacco: Never Used  . Alcohol use No  . Drug use: No  . Sexual activity: No   Other Topics Concern  . Not on file   Social History Narrative   1 SON, 3 DAUGHTER'S   15 GRANDCHILDREN   LIVING AT [facility]    Interim medical history since last visit reviewed. Allergies and medications reviewed  Review of Systems Per HPI unless specifically indicated above     Objective:    BP 130/70   Pulse 68   Temp 99 F (37.2 C) (Oral)   Resp 14   Wt 152 lb 14.4 oz (69.4 kg)   SpO2 98%   BMI 27.97 kg/m   Wt Readings from Last 3 Encounters:  04/30/17 152 lb 14.4 oz (69.4 kg)  04/22/17 152 lb 6.4 oz (69.1 kg)  04/01/17 150 lb (68 kg)    Physical Exam  Results for orders placed or performed during the hospital encounter of 04/01/17  Surgical pathology  Result Value Ref Range   SURGICAL PATHOLOGY      Surgical Pathology CASE: [id number] PATIENT: [name] Surgical Pathology Report     SPECIMEN SUBMITTED: A. Duodenum polyp; cbx B. Esophagus; cbx  CLINICAL HISTORY: None provided  PRE-OPERATIVE DIAGNOSIS: Thickening of the esophagus  POST-OPERATIVE DIAGNOSIS: Duodenal polyp, esophageal ring, esophageal dilation     DIAGNOSIS: A. DUODENAL POLYP; COLD BIOPSY: - POLYPOID REACTIVE DUODENAL MUCOSA CONSISTENT WITH HEALING MUCOSAL INJURY. - NEGATIVE FOR DYSPLASIA AND MALIGNANCY.  B. ESOPHAGUS; COLD BIOPSY: - GASTRIC CARDIAC TYPE MUCOSA WITH CHANGES COMPATIBLE WITH REFLUX. - SEPARATE UNREMARKABLE FRAGMENTS OF SQUAMOUS MUCOSA. - NEGATIVE FOR DYSPLASIA AND MALIGNANCY.   GROSS DESCRIPTION:  A. Labeled: duodenal polyp C BX  Tissue fragment(s): 1  Size:  0.4 cm  Description: pink to tan fragment  Entirely submitted in 1 cassette(s).   B. Labeled: esophagus C BX  Tissue fragment(s): multiple  Size: aggregate 0.6 x 0.4 x 0.1 cm  Description : pink to tan fragments  Entirely submitted in 1  cassette(s).    Final Diagnosis performed by Quay Burow, MD.  Electronically signed 04/02/2017 3:52:40PM    The electronic signature indicates that the named Attending Pathologist has evaluated the specimen  Technical component performed at [facility], [address] [place], [place] [address] Lab: [phone] Dir: Darrick Penna. Evette Doffing, MD  Professional component performed at [facility], [facility], [place], [place], [place] [address] Lab: [phone] Dir: Dellia Nims. Reuel Derby, MD        Assessment & Plan:   Problem List Items Addressed This Visit    None    Visit Diagnoses    Needs flu shot    -  Primary   Relevant Orders   Flu vaccine HIGH DOSE PF (Fluzone High dose) (Completed)       Follow up plan: No Follow-up on file.  An after-visit summary was printed and given to the patient at [facility].  Please see the patient instructions which may contain other information and recommendations beyond what is mentioned above in the assessment and plan.  No orders of the defined types were placed in this encounter.   Orders Placed This Encounter  Procedures  . Flu vaccine HIGH DOSE PF (Fluzone High dose)

## 2017-04-30 NOTE — Assessment & Plan Note (Signed)
USPSTF grade A and B recommendations reviewed with patient; age-appropriate recommendations, preventive care, screening tests, etc discussed and encouraged; healthy living encouraged; see AVS for patient education given to patient  

## 2017-05-01 ENCOUNTER — Other Ambulatory Visit: Payer: Self-pay | Admitting: Family Medicine

## 2017-05-01 ENCOUNTER — Encounter: Payer: Medicare HMO | Admitting: Physical Therapy

## 2017-05-01 DIAGNOSIS — Z66 Do not resuscitate: Secondary | ICD-10-CM

## 2017-05-06 ENCOUNTER — Other Ambulatory Visit: Payer: Self-pay | Admitting: Gastroenterology

## 2017-05-08 ENCOUNTER — Other Ambulatory Visit: Payer: Self-pay | Admitting: *Deleted

## 2017-05-08 DIAGNOSIS — D0512 Intraductal carcinoma in situ of left breast: Secondary | ICD-10-CM

## 2017-05-13 ENCOUNTER — Ambulatory Visit: Payer: Medicare HMO | Admitting: Gastroenterology

## 2017-05-16 ENCOUNTER — Ambulatory Visit: Payer: Commercial Managed Care - HMO | Admitting: Radiation Oncology

## 2017-05-17 ENCOUNTER — Ambulatory Visit (INDEPENDENT_AMBULATORY_CARE_PROVIDER_SITE_OTHER): Payer: Medicare HMO | Admitting: Family Medicine

## 2017-05-17 ENCOUNTER — Encounter: Payer: Self-pay | Admitting: Family Medicine

## 2017-05-17 VITALS — BP 104/78 | HR 81 | Temp 98.2°F | Resp 14 | Wt 156.4 lb

## 2017-05-17 DIAGNOSIS — H6121 Impacted cerumen, right ear: Secondary | ICD-10-CM

## 2017-05-17 DIAGNOSIS — M25512 Pain in left shoulder: Secondary | ICD-10-CM | POA: Diagnosis not present

## 2017-05-17 DIAGNOSIS — M25511 Pain in right shoulder: Secondary | ICD-10-CM | POA: Diagnosis not present

## 2017-05-17 MED ORDER — OXYCODONE-ACETAMINOPHEN 5-325 MG PO TABS: 0.5000 | ORAL_TABLET | Freq: Four times a day (QID) | ORAL | 0 refills | Status: DC | PRN

## 2017-05-17 NOTE — Progress Notes (Signed)
BP 104/78 (BP Location: Right Arm, Patient Position: Sitting, Cuff Size: Normal)   Pulse 81   Temp 98.2 F (36.8 C) (Oral)   Resp 14   Wt 156 lb 6.4 oz (70.9 kg)   SpO2 95%   BMI 28.61 kg/m    Subjective:    Patient ID: [name], female    DOB: 02-17-1930, 83 y.o.   MRN: [id number]  HPI: [name] is a 83 y.o. female  Chief Complaint  Patient presents with  . Cerumen Impaction    right ear wax but pt states in her left ear runs water all the time  . Pain    Pt states she's experiencing  pain all over    HPI Patient came in for ear lavage; her right ear is plugged up with wax Her left ear has a tube and runs "water" all the time No fevers She has been stripping the wax off of her floor for 2 days; really overdid it and hurts all over; shoulders are sore, even to shrug She is complaining of pain all over; she had to use a heating pad for an hour before coming out this morning She does not have any pain medicine, needs something for pain; tried some OTC arthritis medicine Denies chest pain, chest pressure Hospital gave her tramadol but that didn't help  Depression screen [facility] 2/9 05/17/2017 04/30/2017 01/11/2017 01/04/2017 11/26/2016  Decreased Interest 0 0 0 0 0  Down, Depressed, Hopeless 0 0 1 1 0  PHQ - 2 Score 0 0 1 1 0  Altered sleeping - - - - -  Tired, decreased energy - - - - -  Change in appetite - - - - -  Feeling bad or failure about yourself  - - - - -  Trouble concentrating - - - - -  Moving slowly or fidgety/restless - - - - -  Suicidal thoughts - - - - -  PHQ-9 Score - - - - -  Difficult doing work/chores - - - - -  Some recent data might be hidden    Relevant past medical, surgical, family and social history reviewed Past Medical History:  Diagnosis Date  . Anxiety and depression   . Breast cancer ([place]) 2015   LT LUMPECTOMY  . Carpal tunnel syndrome   . Chest pain    SECONDARY TO GERD  . Chronic interstitial cystitis   . Chronic right  hip pain   . Depression   . Diabetes mellitus without complication ([place])   . Diverticulosis   . Dyslipidemia   . Essential hypertension, benign   . Fibromyalgia   . Frequent falls   . GERD (gastroesophageal reflux disease)   . Hearing loss of left ear   . History of fractured rib   . History of TIA (transient ischemic attack)   . Irritable bowel   . Lumbar arthropathy 10/04/2016   On CT scan, refer to ortho  . MS (multiple sclerosis) ([place])   . Osteoarthritis   . Paroxysmal supraventricular tachycardia ([place])   . Peripheral neuropathy    MILD  . Personal history of radiation therapy 2015   BREAST CA  . Protein malnutrition ([place])   . Pure hypercholesterolemia   . Recurrent UTI   . Skin cancer   . SOB (shortness of breath)    SECONDARY TO REFLUX  . Solitary pulmonary nodule on lung CT 12/04/2015   3 mm LLL lung nodule June 2016, March 2017  .  Tachycardia, paroxysmal (HCC)    Reportedly Paroxysmal Supraventricular Tachycardia, but unable to find documentation confirming this  . Thickening of esophagus 01/11/2017   Noted on CT scan June 2018  . Thyroid nodule   . Vertigo   . Vitamin B 12 deficiency    Family History  Problem Relation Age of Onset  . ALS Mother   . Kidney disease Sister   . Arthritis Sister   . Aneurysm Brother   . Heart disease Daughter   . Colon cancer Unknown   . Breast cancer Neg Hx   . Bladder Cancer Neg Hx   . Prostate cancer Neg Hx    Social History   Social History  . Marital status: Married    Spouse name: Marcello Moores  . Number of children: 4  . Years of education: N/A   Occupational History  . 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 Smoking status: Never Smoker  . Smokeless tobacco: Never Used  . Alcohol use No  . Drug use: No  . Sexual activity: No   Other Topics Concern  . Not on file   Social History Narrative   1 SON, 3 DAUGHTER'S   15 GRANDCHILDREN   LIVING AT [facility]   Interim  medical history since last visit reviewed. Allergies and medications reviewed  Review of Systems Per HPI unless specifically indicated above     Objective:    BP 104/78 (BP Location: Right Arm, Patient Position: Sitting, Cuff Size: Normal)   Pulse 81   Temp 98.2 F (36.8 C) (Oral)   Resp 14   Wt 156 lb 6.4 oz (70.9 kg)   SpO2 95%   BMI 28.61 kg/m   Wt Readings from Last 3 Encounters:  05/17/17 156 lb 6.4 oz (70.9 kg)  04/30/17 152 lb 14.4 oz (69.4 kg)  04/22/17 152 lb 6.4 oz (69.1 kg)    Physical Exam  Constitutional: She appears well-developed and well-nourished.  HENT:  Right Ear: Decreased hearing is noted.  Left Ear: Decreased hearing is noted.  Mouth/Throat: Oropharynx is clear and moist and mucous membranes are normal.  Impacted cerumen irrigated by CMA, tolerate well, complete clearance of EAC on the RIGHT The LEFT TM has a tympanostomy tube in place; scant clear fluid; no erythema  Eyes: EOM are normal. No scleral icterus.  Cardiovascular: Normal rate and regular rhythm.   Pulmonary/Chest: Effort normal and breath sounds normal.  Musculoskeletal:  Tender over the trapezius muscles bilaterally; limited ROM in the shoulders with abduction; patient made significant grimace when shrugging shoulder  Skin: No rash (specifically no rash over the neck or shoulders to suggest shingles) noted. She is not diaphoretic.  Psychiatric: She has a normal mood and affect. Her behavior is normal. Her mood appears not anxious. She does not exhibit a depressed mood.      Assessment & Plan:   Problem List Items Addressed This Visit    None    Visit Diagnoses    Impacted cerumen of right ear    -  Primary   tolerated irrigation by CMA, complete clearance of the right EAC   Relevant Orders   Ear Lavage   Bilateral shoulder pain, unspecified chronicity       EKG NSR, single PVC; tender muscles; considered Celebrex but sulfa allergy; will use low dose hydrocodone since tramadol  reported not strong enough   Relevant Orders   EKG 12-Lead  Follow up plan: No Follow-up on file.  An after-visit summary was printed and given to the patient at [place].  Please see the patient instructions which may contain other information and recommendations beyond what is mentioned above in the assessment and plan.  Meds ordered this encounter  Medications  . oxyCODONE-acetaminophen (ROXICET) 5-325 MG tablet    Sig: Take 0.5 tablets by mouth every 6 (six) hours as needed.    Dispense:  8 tablet    Refill:  0    Orders Placed This Encounter  Procedures  . EKG 12-Lead  . Ear Lavage

## 2017-05-17 NOTE — Patient Instructions (Signed)
Use the pain medicine only if really needed Let your ear nose throat doctor know that your left ear continues to drain

## 2017-05-21 DIAGNOSIS — H04123 Dry eye syndrome of bilateral lacrimal glands: Secondary | ICD-10-CM | POA: Diagnosis not present

## 2017-05-29 ENCOUNTER — Telehealth: Payer: Self-pay

## 2017-05-29 ENCOUNTER — Other Ambulatory Visit: Payer: Self-pay

## 2017-05-29 MED ORDER — OMEPRAZOLE 20 MG PO CPDR: DELAYED_RELEASE_CAPSULE | ORAL | 1 refills | Status: DC

## 2017-05-29 NOTE — Telephone Encounter (Signed)
Pt is requesting 90 day prescription of atorvastatin vs. 30 day supply. Last filled 03/2017. Please advise.

## 2017-05-30 MED ORDER — ATORVASTATIN CALCIUM 40 MG PO TABS: 40.0000 mg | ORAL_TABLET | Freq: Every day | ORAL | 1 refills | Status: DC

## 2017-06-03 ENCOUNTER — Encounter: Payer: Self-pay | Admitting: Gastroenterology

## 2017-06-03 ENCOUNTER — Ambulatory Visit (INDEPENDENT_AMBULATORY_CARE_PROVIDER_SITE_OTHER): Payer: Medicare HMO | Admitting: Gastroenterology

## 2017-06-03 ENCOUNTER — Encounter (INDEPENDENT_AMBULATORY_CARE_PROVIDER_SITE_OTHER): Payer: Self-pay

## 2017-06-03 VITALS — BP 145/77 | HR 70 | Temp 98.2°F | Ht 62.0 in | Wt 155.8 lb

## 2017-06-03 DIAGNOSIS — K6289 Other specified diseases of anus and rectum: Secondary | ICD-10-CM

## 2017-06-03 NOTE — Addendum Note (Signed)
Addended by: Peggye Ley on: 06/03/2017 01:30 PM   Modules accepted: Orders, SmartSet

## 2017-06-03 NOTE — Progress Notes (Signed)
Jonathon Bellows MD, MRCP(U.K) [address]  [place]  [place], [place] [address]  Main: [phone]  Fax: [phone]   Primary Care Physician: Arnetha Courser, MD  Primary Gastroenterologist:  Dr. Jonathon Bellows   Chief Complaint  Patient presents with  . Follow-up  . Rectal Pain    HPI: [name] is a 83 y.o. female   Summary of history : She is here today to follow up for dysphagia. She was previously under the care of GI at [facility] clinic who had dilated her esophagus in the past (schatzkis ring dilated to 18 mm in 2016 ). She has a history of GERD. Hb 13.4 on 01/09/17 . She was referred and seen by me on 02/19/17 for a thickened esophagus seen on a CT scan of the abdomen . She had dysphagia for a few months on and off. 04/01/17 - EGD- Schatzkis ring dilated, small duodenal polyps excised which was benign. Esophageal biopsies showed features of reflux.    Interval history   04/22/2017 -06/03/17  Uses miralax- daily , Anusol did help. Swallowing is doing fine. Says she feels tight in her anus.     Current Outpatient Prescriptions  Medication Sig Dispense Refill  . acetaminophen (TYLENOL) 500 MG tablet Take 500 mg by mouth every 8 (eight) hours as needed.     Marland Kitchen amLODipine (NORVASC) 2.5 MG tablet TAKE 1 TABLET (2.5 MG TOTAL) BY MOUTH DAILY. 30 tablet 11  . aspirin 81 MG tablet Take 81 mg by mouth daily.     Marland Kitchen atorvastatin (LIPITOR) 40 MG tablet Take 1 tablet (40 mg total) by mouth at bedtime. 90 tablet 1  . Carboxymethylcell-Hypromellose (GENTEAL OP) Apply 1 drop to eye. Each eye as needed    . Cyanocobalamin (VITAMIN B-12) 1000 MCG SUBL     . fluticasone (FLONASE) 50 MCG/ACT nasal spray SPRAY 2 SPRAYS EACH INTO BOTH NOSTRILS ONCE prn  5  . hydrocortisone (PROCTO-PAK) 1 % CREA Apply to rectum twice a day 28.35 g 0  . Loratadine 10 MG CAPS Take 10 mg by mouth daily.     . meclizine (ANTIVERT) 25 MG tablet Take 1 tablet (25 mg total) by mouth every 8 (eight) hours as  needed for dizziness. 30 tablet 2  . Melatonin 5 MG CAPS     . memantine (NAMENDA) 10 MG tablet Take 10 mg by mouth 2 (two) times daily.     Marland Kitchen neomycin-polymyxin b-dexamethasone (MAXITROL) 3.5-10000-0.1 OINT PLACE SMALL RIBBON IN BOTH EYES 2 TIMES A DAY FOR 14 DAYS  0  . omeprazole (PRILOSEC) 20 MG capsule TAKE 1 CAPSULE BY MOUTH EVERY DAY 90 capsule 1  . oxyCODONE-acetaminophen (ROXICET) 5-325 MG tablet Take 0.5 tablets by mouth every 6 (six) hours as needed. 8 tablet 0  . polyethylene glycol powder (GLYCOLAX/MIRALAX) powder 17 grams daily 255 g 3  . Probiotic Product (PROBIOTIC COLON SUPPORT) CAPS Take 1 capsule by mouth daily.     . sertraline (ZOLOFT) 50 MG tablet TAKE 1 TABLET (50 MG TOTAL) BY MOUTH DAILY. 90 tablet 2  . alfuzosin (UROXATRAL) 10 MG 24 hr tablet Take 1 tablet (10 mg total) by mouth daily before supper. (Patient not taking: Reported on 06/03/2017) 7 tablet 0  . b complex vitamins tablet Take 1 tablet by mouth daily.    . [facility] MELATONIN 5 MG CAPS     . ondansetron (ZOFRAN) 4 MG tablet Take 4 mg by mouth every 8 (eight) hours as needed for nausea or  vomiting. Reported on 12/22/2015    . trimethoprim (TRIMPEX) 100 MG tablet TAKE 1 TABLET (100 MG TOTAL) BY MOUTH DAILY. (Patient not taking: Reported on 04/30/2017) 90 tablet 0   No current facility-administered medications for this visit.     Allergies as of 06/03/2017 - Review Complete 06/03/2017  Allergen Reaction Noted  . Aricept [donepezil hcl] Other (See Comments) 04/27/2016  . Biaxin [clarithromycin] Other (See Comments) and Diarrhea 06/16/2008  . Copaxone [glatiramer acetate]  01/20/2015  . Decadron [dexamethasone] Nausea And Vomiting and Other (See Comments) 01/06/2015  . Dexamethasone sodium phosphate Other (See Comments) 07/20/2015  . Diltiazem hcl Other (See Comments)   . Diltiazem hcl Other (See Comments) 02/21/2015  . Flagyl [metronidazole] Nausea Only and Diarrhea 01/06/2015  . Gabapentin Other (See Comments)   .  Iohexol Swelling and Other (See Comments) 08/22/2008  . Ivp dye [iodinated diagnostic agents] Swelling and Other (See Comments) 01/06/2015  . Levothyroxine Nausea Only 01/13/2014  . Macrobid [nitrofurantoin macrocrystal] Nausea And Vomiting 08/30/2016  . Sulfa antibiotics Other (See Comments) 11/24/2015  . Sulfonamide derivatives Other (See Comments)   . Synthroid [levothyroxine sodium]  01/20/2015  . Copaxone  [glatiramer] Rash 02/21/2015  . Interferon beta-1a Other (See Comments) and Rash     ROS:  General: Negative for anorexia, weight loss, fever, chills, fatigue, weakness. ENT: Negative for hoarseness, difficulty swallowing , nasal congestion. CV: Negative for chest pain, angina, palpitations, dyspnea on exertion, peripheral edema.  Respiratory: Negative for dyspnea at rest, dyspnea on exertion, cough, sputum, wheezing.  GI: See history of present illness. GU:  Negative for dysuria, hematuria, urinary incontinence, urinary frequency, nocturnal urination.  Endo: Negative for unusual weight change.    Physical Examination:   BP (!) 145/77 (BP Location: Right Arm, Patient Position: Sitting, Cuff Size: Normal)   Pulse 70   Temp 98.2 F (36.8 C) (Oral)   Ht 5\' 2"  (1.575 m)   Wt 155 lb 12.8 oz (70.7 kg)   BMI 28.50 kg/m   General: Well-nourished, well-developed in no acute distress.  Eyes: No icterus. Conjunctivae pink. Mouth: Oropharyngeal mucosa moist and pink , no lesions erythema or exudate. Lungs: Clear to auscultation bilaterally. Non-labored. Heart: Regular rate and rhythm, no murmurs rubs or gallops.  Abdomen: Bowel sounds are normal, nontender, nondistended, no hepatosplenomegaly or masses, no abdominal bruits or hernia , no rebound or guarding.   Extremities: No lower extremity edema. No clubbing or deformities. Neuro: Alert and oriented x 3.  Grossly intact. Skin: Warm and dry, no jaundice.   Psych: Alert and cooperative, normal mood and affect.   Imaging  Studies: No results found.  Assessment and Plan:   [name] is a 83 y.o. y/o female here to follow up for dysphagia and reflux which are stable and improved. She has persistent anal discomfort with burning and some pain. Likely due to a fissure of hemorroids.  .    Plan  1.Flexible sigmoidoscopy., advised to sit in a warm water bath twice a day . Avoid excess wiping of the peri anal area.    I have discussed alternative options, risks & benefits,  which include, but are not limited to, bleeding, infection, perforation,respiratory complication & drug reaction.  The patient agrees with this plan & written consent will be obtained.     Dr Jonathon Bellows  MD,MRCP [facility]) Follow up in 3 months

## 2017-06-04 ENCOUNTER — Encounter: Payer: Self-pay | Admitting: *Deleted

## 2017-06-05 ENCOUNTER — Encounter: Payer: Self-pay | Admitting: *Deleted

## 2017-06-05 ENCOUNTER — Encounter
Admission: RE | Disposition: A | Discharge status: Home / Self Care | Payer: Self-pay | Source: Ambulatory Visit | Attending: Gastroenterology

## 2017-06-05 ENCOUNTER — Ambulatory Visit
Admission: RE | Admit: 2017-06-05 | Discharge: 2017-06-05 | Disposition: A | Discharge status: Home / Self Care | Payer: Medicare HMO | Source: Ambulatory Visit | Attending: Gastroenterology | Admitting: Gastroenterology

## 2017-06-05 DIAGNOSIS — Z7982 Long term (current) use of aspirin: Secondary | ICD-10-CM | POA: Insufficient documentation

## 2017-06-05 DIAGNOSIS — E46 Unspecified protein-calorie malnutrition: Secondary | ICD-10-CM | POA: Diagnosis not present

## 2017-06-05 DIAGNOSIS — Z9181 History of falling: Secondary | ICD-10-CM | POA: Insufficient documentation

## 2017-06-05 DIAGNOSIS — E1140 Type 2 diabetes mellitus with diabetic neuropathy, unspecified: Secondary | ICD-10-CM | POA: Insufficient documentation

## 2017-06-05 DIAGNOSIS — Z841 Family history of disorders of kidney and ureter: Secondary | ICD-10-CM | POA: Insufficient documentation

## 2017-06-05 DIAGNOSIS — Z8673 Personal history of transient ischemic attack (TIA), and cerebral infarction without residual deficits: Secondary | ICD-10-CM | POA: Diagnosis not present

## 2017-06-05 DIAGNOSIS — Z882 Allergy status to sulfonamides status: Secondary | ICD-10-CM | POA: Insufficient documentation

## 2017-06-05 DIAGNOSIS — Z8249 Family history of ischemic heart disease and other diseases of the circulatory system: Secondary | ICD-10-CM | POA: Insufficient documentation

## 2017-06-05 DIAGNOSIS — Z6828 Body mass index (BMI) 28.0-28.9, adult: Secondary | ICD-10-CM | POA: Insufficient documentation

## 2017-06-05 DIAGNOSIS — K5790 Diverticulosis of intestine, part unspecified, without perforation or abscess without bleeding: Secondary | ICD-10-CM | POA: Insufficient documentation

## 2017-06-05 DIAGNOSIS — I471 Supraventricular tachycardia: Secondary | ICD-10-CM | POA: Insufficient documentation

## 2017-06-05 DIAGNOSIS — Z888 Allergy status to other drugs, medicaments and biological substances status: Secondary | ICD-10-CM | POA: Insufficient documentation

## 2017-06-05 DIAGNOSIS — E538 Deficiency of other specified B group vitamins: Secondary | ICD-10-CM | POA: Insufficient documentation

## 2017-06-05 DIAGNOSIS — H9192 Unspecified hearing loss, left ear: Secondary | ICD-10-CM | POA: Diagnosis not present

## 2017-06-05 DIAGNOSIS — Z820 Family history of epilepsy and other diseases of the nervous system: Secondary | ICD-10-CM | POA: Insufficient documentation

## 2017-06-05 DIAGNOSIS — Z923 Personal history of irradiation: Secondary | ICD-10-CM | POA: Diagnosis not present

## 2017-06-05 DIAGNOSIS — Z800 Family history of malignant neoplasm of digestive organs: Secondary | ICD-10-CM | POA: Insufficient documentation

## 2017-06-05 DIAGNOSIS — E785 Hyperlipidemia, unspecified: Secondary | ICD-10-CM | POA: Insufficient documentation

## 2017-06-05 DIAGNOSIS — Z87440 Personal history of urinary (tract) infections: Secondary | ICD-10-CM | POA: Insufficient documentation

## 2017-06-05 DIAGNOSIS — E7800 Pure hypercholesterolemia, unspecified: Secondary | ICD-10-CM | POA: Insufficient documentation

## 2017-06-05 DIAGNOSIS — Z85828 Personal history of other malignant neoplasm of skin: Secondary | ICD-10-CM | POA: Diagnosis not present

## 2017-06-05 DIAGNOSIS — F419 Anxiety disorder, unspecified: Secondary | ICD-10-CM | POA: Insufficient documentation

## 2017-06-05 DIAGNOSIS — M1990 Unspecified osteoarthritis, unspecified site: Secondary | ICD-10-CM | POA: Diagnosis not present

## 2017-06-05 DIAGNOSIS — F329 Major depressive disorder, single episode, unspecified: Secondary | ICD-10-CM | POA: Insufficient documentation

## 2017-06-05 DIAGNOSIS — Z8261 Family history of arthritis: Secondary | ICD-10-CM | POA: Insufficient documentation

## 2017-06-05 DIAGNOSIS — G35 Multiple sclerosis: Secondary | ICD-10-CM | POA: Diagnosis not present

## 2017-06-05 DIAGNOSIS — M25551 Pain in right hip: Secondary | ICD-10-CM | POA: Insufficient documentation

## 2017-06-05 DIAGNOSIS — Z91041 Radiographic dye allergy status: Secondary | ICD-10-CM | POA: Insufficient documentation

## 2017-06-05 DIAGNOSIS — K589 Irritable bowel syndrome without diarrhea: Secondary | ICD-10-CM | POA: Diagnosis not present

## 2017-06-05 DIAGNOSIS — M797 Fibromyalgia: Secondary | ICD-10-CM | POA: Diagnosis not present

## 2017-06-05 DIAGNOSIS — R911 Solitary pulmonary nodule: Secondary | ICD-10-CM | POA: Insufficient documentation

## 2017-06-05 DIAGNOSIS — Z881 Allergy status to other antibiotic agents status: Secondary | ICD-10-CM | POA: Insufficient documentation

## 2017-06-05 DIAGNOSIS — Z853 Personal history of malignant neoplasm of breast: Secondary | ICD-10-CM | POA: Diagnosis not present

## 2017-06-05 DIAGNOSIS — Z90710 Acquired absence of both cervix and uterus: Secondary | ICD-10-CM | POA: Insufficient documentation

## 2017-06-05 DIAGNOSIS — I10 Essential (primary) hypertension: Secondary | ICD-10-CM | POA: Insufficient documentation

## 2017-06-05 DIAGNOSIS — G5600 Carpal tunnel syndrome, unspecified upper limb: Secondary | ICD-10-CM | POA: Insufficient documentation

## 2017-06-05 DIAGNOSIS — R42 Dizziness and giddiness: Secondary | ICD-10-CM | POA: Insufficient documentation

## 2017-06-05 DIAGNOSIS — K6289 Other specified diseases of anus and rectum: Secondary | ICD-10-CM | POA: Diagnosis not present

## 2017-06-05 DIAGNOSIS — Z79899 Other long term (current) drug therapy: Secondary | ICD-10-CM | POA: Insufficient documentation

## 2017-06-05 DIAGNOSIS — Z9011 Acquired absence of right breast and nipple: Secondary | ICD-10-CM | POA: Insufficient documentation

## 2017-06-05 DIAGNOSIS — G8929 Other chronic pain: Secondary | ICD-10-CM | POA: Insufficient documentation

## 2017-06-05 DIAGNOSIS — K219 Gastro-esophageal reflux disease without esophagitis: Secondary | ICD-10-CM | POA: Insufficient documentation

## 2017-06-05 DIAGNOSIS — R0602 Shortness of breath: Secondary | ICD-10-CM | POA: Diagnosis not present

## 2017-06-05 DIAGNOSIS — N3010 Interstitial cystitis (chronic) without hematuria: Secondary | ICD-10-CM | POA: Diagnosis not present

## 2017-06-05 HISTORY — PX: FLEXIBLE SIGMOIDOSCOPY: SHX5431

## 2017-06-05 SURGERY — SIGMOIDOSCOPY, FLEXIBLE

## 2017-06-05 MED ORDER — HYDROCORTISONE ACETATE 25 MG RE SUPP: 25.0000 mg | Freq: Every day | RECTAL | 0 refills | Status: AC

## 2017-06-05 MED ORDER — LIDOCAINE HCL 2 % EX GEL
CUTANEOUS | Status: AC
  Filled 2017-06-05: qty 5

## 2017-06-05 NOTE — H&P (Signed)
Jonathon Bellows, MD [address], [place], [place], [place], [address] [address], [place], [place], [place], [address] Phone: [phone]  Fax: [phone]  Primary Care Physician:  Arnetha Courser, MD   Pre-Procedure History & Physical: HPI:  [name] is a 83 y.o. female is here for a flexible sigmoidoscopy    Past Medical History:  Diagnosis Date  . Anxiety and depression   . Breast cancer ([place]) 2015   LT LUMPECTOMY  . Carpal tunnel syndrome   . Chest pain    SECONDARY TO GERD  . Chronic interstitial cystitis   . Chronic right hip pain   . Depression   . Diabetes mellitus without complication ([place])   . Diverticulosis   . Dyslipidemia   . Essential hypertension, benign   . Fibromyalgia   . Frequent falls   . GERD (gastroesophageal reflux disease)   . Hearing loss of left ear   . History of fractured rib   . History of TIA (transient ischemic attack)   . Irritable bowel   . Lumbar arthropathy 10/04/2016   On CT scan, refer to ortho  . MS (multiple sclerosis) ([place])   . Osteoarthritis   . Paroxysmal supraventricular tachycardia ([place])   . Peripheral neuropathy    MILD  . Personal history of radiation therapy 2015   BREAST CA  . Protein malnutrition ([place])   . Pure hypercholesterolemia   . Recurrent UTI   . Skin cancer   . SOB (shortness of breath)    SECONDARY TO REFLUX  . Solitary pulmonary nodule on lung CT 12/04/2015   3 mm LLL lung nodule June 2016, March 2017  . Tachycardia, paroxysmal (HCC)    Reportedly Paroxysmal Supraventricular Tachycardia, but unable to find documentation confirming this  . Thickening of esophagus 01/11/2017   Noted on CT scan June 2018  . Thyroid nodule   . Vertigo   . Vitamin B 12 deficiency     Past Surgical History:  Procedure Laterality Date  . APPENDECTOMY    . AUGMENTATION MAMMAPLASTY Right 1975   BREAST BREAST ONLY  . BLADDER TACKING     X 3  . BREAST LUMPECTOMY    . BREAST SURGERY    . CARDIAC  CATHETERIZATION  10/2001   Normal coronary arteries with the exception of 20% proximal D1  . CHOLECYSTECTOMY    . COLONOSCOPY WITH PROPOFOL N/A 01/21/2015   Procedure: COLONOSCOPY WITH PROPOFOL;  Surgeon: Josefine Class, MD;  Location: [facility] ENDOSCOPY;  Service: Endoscopy;  Laterality: N/A;  . ESOPHAGOGASTRODUODENOSCOPY N/A 01/21/2015   Procedure: ESOPHAGOGASTRODUODENOSCOPY (EGD);  Surgeon: Josefine Class, MD;  Location: [facility] ENDOSCOPY;  Service: Endoscopy;  Laterality: N/A;  . ESOPHAGOGASTRODUODENOSCOPY (EGD) WITH PROPOFOL N/A 04/01/2017   Procedure: ESOPHAGOGASTRODUODENOSCOPY (EGD) WITH PROPOFOL WITH BANDING;  Surgeon: Jonathon Bellows, MD;  Location: [facility] ENDOSCOPY;  Service: Gastroenterology;  Laterality: N/A;  . EYE SURGERIES     WITH BUCKLE DETACHMENT OF THE RETINA  . heart monitor     Not being used... Patient can not have a MRI  . HEMORRHOID SURGERY     WITH RECONSTRUCTION  . MASTECTOMY Right 1970   SUBCUTANEOUS - NO BREAST CANCER  . TONSILLECTOMY    . TOTAL ABDOMINAL HYSTERECTOMY W/ BILATERAL SALPINGOOPHORECTOMY    . TYMPANOPLASTY      Prior to Admission medications   Medication Sig Start Date End Date Taking? Authorizing Provider  acetaminophen (TYLENOL) 500 MG tablet Take 500 mg by mouth every 8 (  eight) hours as needed.    Yes [provider]  alfuzosin (UROXATRAL) 10 MG 24 hr tablet Take 1 tablet (10 mg total) by mouth daily before supper. 01/15/17  Yes Festus Aloe, MD  amLODipine (NORVASC) 2.5 MG tablet TAKE 1 TABLET (2.5 MG TOTAL) BY MOUTH DAILY. 10/29/16  Yes Lada, Satira Anis, MD  aspirin 81 MG tablet Take 81 mg by mouth daily.  03/07/17  Yes [provider]  atorvastatin (LIPITOR) 40 MG tablet Take 1 tablet (40 mg total) by mouth at bedtime. 05/30/17  Yes Lada, Satira Anis, MD  b complex vitamins tablet Take 1 tablet by mouth daily.   Yes [provider]  Carboxymethylcell-Hypromellose (GENTEAL OP) Apply 1 drop to eye. Each eye as needed   Yes  [provider]  Cyanocobalamin (VITAMIN B-12) 1000 MCG SUBL  09/16/15  Yes [provider]  fluticasone (FLONASE) 50 MCG/ACT nasal spray SPRAY 2 SPRAYS EACH INTO BOTH NOSTRILS ONCE prn 10/19/15  Yes [provider]  hydrocortisone (PROCTO-PAK) 1 % CREA Apply to rectum twice a day 09/03/16  Yes Lada, Satira Anis, MD  Loratadine 10 MG CAPS Take 10 mg by mouth daily.  03/07/17  Yes [provider]  meclizine (ANTIVERT) 25 MG tablet Take 1 tablet (25 mg total) by mouth every 8 (eight) hours as needed for dizziness. 07/31/16  Yes Lada, Satira Anis, MD  memantine (NAMENDA) 10 MG tablet Take 10 mg by mouth 2 (two) times daily.  08/11/16  Yes [provider]  neomycin-polymyxin b-dexamethasone (MAXITROL) 3.5-10000-0.1 OINT PLACE SMALL RIBBON IN BOTH EYES 2 TIMES A DAY FOR 14 DAYS 02/28/17  Yes [provider]  omeprazole (PRILOSEC) 20 MG capsule TAKE 1 CAPSULE BY MOUTH EVERY DAY 05/29/17  Yes Jonathon Bellows, MD  ondansetron (ZOFRAN) 4 MG tablet Take 4 mg by mouth every 8 (eight) hours as needed for nausea or vomiting. Reported on 12/22/2015   Yes [provider]  oxyCODONE-acetaminophen (ROXICET) 5-325 MG tablet Take 0.5 tablets by mouth every 6 (six) hours as needed. 05/17/17 05/17/18 Yes Lada, Satira Anis, MD  polyethylene glycol powder (GLYCOLAX/MIRALAX) powder 17 grams daily 04/22/17  Yes Jonathon Bellows, MD  Probiotic Product (PROBIOTIC COLON SUPPORT) CAPS Take 1 capsule by mouth daily.    Yes [provider]  sertraline (ZOLOFT) 50 MG tablet TAKE 1 TABLET (50 MG TOTAL) BY MOUTH DAILY. 03/25/17  Yes Lada, Satira Anis, MD  trimethoprim (TRIMPEX) 100 MG tablet TAKE 1 TABLET (100 MG TOTAL) BY MOUTH DAILY. 01/21/17  Yes Festus Aloe, MD  [facility] MELATONIN 5 MG CAPS  03/07/17   [provider]  Melatonin 5 MG CAPS  03/07/17   [provider]    Allergies as of 06/03/2017 - Review Complete 06/03/2017  Allergen Reaction Noted  . Aricept  [donepezil hcl] Other (See Comments) 04/27/2016  . Biaxin [clarithromycin] Other (See Comments) and Diarrhea 06/16/2008  . Copaxone [glatiramer acetate]  01/20/2015  . Decadron [dexamethasone] Nausea And Vomiting and Other (See Comments) 01/06/2015  . Dexamethasone sodium phosphate Other (See Comments) 07/20/2015  . Diltiazem hcl Other (See Comments)   . Diltiazem hcl Other (See Comments) 02/21/2015  . Flagyl [metronidazole] Nausea Only and Diarrhea 01/06/2015  . Gabapentin Other (See Comments)   . Iohexol Swelling and Other (See Comments) 08/22/2008  . Ivp dye [iodinated diagnostic agents] Swelling and Other (See Comments) 01/06/2015  . Levothyroxine Nausea Only 01/13/2014  . Macrobid [nitrofurantoin macrocrystal] Nausea And Vomiting 08/30/2016  . Sulfa antibiotics Other (See Comments)  11/24/2015  . Sulfonamide derivatives Other (See Comments)   . Synthroid [levothyroxine sodium]  01/20/2015  . Copaxone  [glatiramer] Rash 02/21/2015  . Interferon beta-1a Other (See Comments) and Rash     Family History  Problem Relation Age of Onset  . ALS Mother   . Kidney disease Sister   . Arthritis Sister   . Aneurysm Brother   . Heart disease Daughter   . Colon cancer Unknown   . Breast cancer Neg Hx   . Bladder Cancer Neg Hx   . Prostate cancer Neg Hx     Social History   Social History  . Marital status: Married    Spouse name: Marcello Moores  . Number of children: 4  . Years of education: N/A   Occupational History  . 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 Smoking status: Never Smoker  . Smokeless tobacco: Never Used  . Alcohol use No  . Drug use: No  . Sexual activity: No   Other Topics Concern  . Not on file   Social History Narrative   1 SON, 3 DAUGHTER'S   15 GRANDCHILDREN   LIVING AT [employer]    Review of Systems: See HPI, otherwise negative ROS  Physical Exam: BP (!) 161/81   Pulse 77   Temp 98.6 F (37 C) (Oral)    Resp 14   Ht 5\' 2"  (1.575 m)   Wt 155 lb (70.3 kg)   SpO2 99%   BMI 28.35 kg/m  General:   Alert,  pleasant and cooperative in NAD Head:  Normocephalic and atraumatic. Neck:  Supple; no masses or thyromegaly. Lungs:  Clear throughout to auscultation, normal respiratory effort.    Heart:  +S1, +S2, Regular rate and rhythm, No edema. Abdomen:  Soft, nontender and nondistended. Normal bowel sounds, without guarding, and without rebound.   Neurologic:  Alert and  oriented x4;  grossly normal neurologically.  Impression/Plan: [name] is here for a flexible sigmoidoscopy to be performed for anal pain and rectal discomfort   Risks, benefits, limitations, and alternatives regarding  colonoscopy have been reviewed with the patient.  Questions have been answered.  All parties agreeable.   Jonathon Bellows, MD  06/05/2017, 1:43 PM

## 2017-06-05 NOTE — Op Note (Signed)
[facility] Gastroenterology Patient Name: [name] Procedure Date: 06/05/2017 2:04 PM MRN: [id number] Account #: [id number] Date of Birth: Dec 29, 1929 Admit Type: Ambulatory Age: 83 Room: [facility] ENDO ROOM 1 Gender: Female Note Status: Finalized Procedure:            Flexible Sigmoidoscopy Indications:          Anal pain, Rectal pain Providers:            Jonathon Bellows MD, MD Medicines:            No sedation medications were administered Complications:        No immediate complications. Procedure:            Pre-Anesthesia Assessment:                       - Prior to the procedure, a History and Physical was                        performed, and patient medications, allergies and                        sensitivities were reviewed. The patient's tolerance of                        previous anesthesia was reviewed.                       - The risks and benefits of the procedure and the                        sedation options and risks were discussed with the                        patient. All questions were answered and informed                        consent was obtained.                       - ASA Grade Assessment: III - A patient with severe                        systemic disease.                       - After reviewing the risks and benefits, the patient                        was deemed in satisfactory condition to undergo the                        procedure.                       After obtaining informed consent, the scope was passed                        under direct vision. The Endoscope was introduced                        through the anus and advanced to the the sigmoid colon.  The flexible sigmoidoscopy was accomplished with ease.                        The patient tolerated the procedure well. The quality                        of the bowel preparation was adequate. Findings:      The digital rectal exam was normal.  Pertinent negatives include normal       sphincter tone, no palpable rectal lesions and no anal lesion or       abnormality was detected.      Localized mild inflammation characterized by erythema was found at the       anus.      The exam was otherwise without abnormality. Impression:           - Localized mild inflammation was found at the anus.                       - The examination was otherwise normal.                       - No specimens collected. Recommendation:       - Use hydrocortisone suppository 25 mg 1 per rectum                        once a day for 7 days. Procedure Code(s):    --- Professional ---                       [phone], Sigmoidoscopy, flexible; diagnostic, including                        collection of specimen(s) by brushing or washing, when                        performed (separate procedure) Diagnosis Code(s):    --- Professional ---                       K62.89, Other specified diseases of anus and rectum CPT copyright 2016 American Medical Association. All rights reserved. The codes documented in this report are preliminary and upon coder review may  be revised to meet current compliance requirements. Jonathon Bellows, MD Jonathon Bellows MD, MD 06/05/2017 2:16:00 PM This report has been signed electronically. Number of Addenda: 0 Note Initiated On: 06/05/2017 2:04 PM Total Procedure Duration: 0 hours 4 minutes 11 seconds       [facility]

## 2017-06-06 ENCOUNTER — Ambulatory Visit
Admission: RE | Admit: 2017-06-06 | Discharge: 2017-06-06 | Disposition: A | Discharge status: Home / Self Care | Payer: Medicare HMO | Source: Ambulatory Visit | Attending: Radiation Oncology | Admitting: Radiation Oncology

## 2017-06-06 DIAGNOSIS — D0512 Intraductal carcinoma in situ of left breast: Secondary | ICD-10-CM

## 2017-06-06 DIAGNOSIS — E785 Hyperlipidemia, unspecified: Secondary | ICD-10-CM | POA: Diagnosis not present

## 2017-06-06 DIAGNOSIS — F419 Anxiety disorder, unspecified: Secondary | ICD-10-CM | POA: Diagnosis not present

## 2017-06-06 DIAGNOSIS — K6289 Other specified diseases of anus and rectum: Secondary | ICD-10-CM | POA: Diagnosis not present

## 2017-06-06 DIAGNOSIS — R928 Other abnormal and inconclusive findings on diagnostic imaging of breast: Secondary | ICD-10-CM | POA: Diagnosis not present

## 2017-06-06 DIAGNOSIS — E1140 Type 2 diabetes mellitus with diabetic neuropathy, unspecified: Secondary | ICD-10-CM | POA: Diagnosis not present

## 2017-06-06 DIAGNOSIS — K5790 Diverticulosis of intestine, part unspecified, without perforation or abscess without bleeding: Secondary | ICD-10-CM | POA: Diagnosis not present

## 2017-06-06 DIAGNOSIS — K219 Gastro-esophageal reflux disease without esophagitis: Secondary | ICD-10-CM | POA: Diagnosis not present

## 2017-06-06 DIAGNOSIS — N3010 Interstitial cystitis (chronic) without hematuria: Secondary | ICD-10-CM | POA: Diagnosis not present

## 2017-06-06 DIAGNOSIS — I10 Essential (primary) hypertension: Secondary | ICD-10-CM | POA: Diagnosis not present

## 2017-06-06 DIAGNOSIS — F329 Major depressive disorder, single episode, unspecified: Secondary | ICD-10-CM | POA: Diagnosis not present

## 2017-06-07 ENCOUNTER — Encounter: Payer: Self-pay | Admitting: Gastroenterology

## 2017-06-12 ENCOUNTER — Other Ambulatory Visit: Payer: Self-pay

## 2017-06-12 ENCOUNTER — Encounter: Payer: Self-pay | Admitting: Radiation Oncology

## 2017-06-12 ENCOUNTER — Ambulatory Visit
Admission: RE | Admit: 2017-06-12 | Discharge: 2017-06-12 | Disposition: A | Discharge status: Home / Self Care | Payer: Medicare HMO | Source: Ambulatory Visit | Attending: Radiation Oncology | Admitting: Radiation Oncology

## 2017-06-12 VITALS — BP 134/78 | HR 73 | Temp 99.1°F | Resp 12 | Ht 62.0 in | Wt 154.8 lb

## 2017-06-12 DIAGNOSIS — Z923 Personal history of irradiation: Secondary | ICD-10-CM | POA: Diagnosis not present

## 2017-06-12 DIAGNOSIS — D0512 Intraductal carcinoma in situ of left breast: Secondary | ICD-10-CM

## 2017-06-12 DIAGNOSIS — Z86000 Personal history of in-situ neoplasm of breast: Secondary | ICD-10-CM | POA: Insufficient documentation

## 2017-06-12 NOTE — Progress Notes (Signed)
Radiation Oncology Follow up Note  Name: [name]   Date:   06/12/2017 MRN:  [id number] DOB: 03/25/1930    This 83 y.o. 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 Seen today in routine follow-up she is doing well. She's does have occasional breast tenderness nothing problematic.Marland Kitchen She had mammograms this month which I have reviewed which were BI-RADS 2 benign. She's currently not on anti-estrogen therapy.  COMPLICATIONS OF TREATMENT: none  FOLLOW UP COMPLIANCE: keeps appointments   PHYSICAL EXAM:  BP 134/78 (BP Location: Right Arm, Patient Position: Sitting, Cuff Size: Normal)   Pulse 73   Temp 99.1 F (37.3 C) (Tympanic)   Resp 12   Ht 5\' 2"  (1.575 m)   Wt 154 lb 12.2 oz (70.2 kg)   BMI 28.31 kg/m  Lungs are clear to A&P cardiac examination essentially unremarkable with regular rate and rhythm. No dominant mass or nodularity is noted in either breast in 2 positions examined. Incision is well-healed. No axillary or supraclavicular adenopathy is appreciated. Cosmetic result is excellent. Well-developed well-nourished patient in NAD. HEENT reveals PERLA, EOMI, discs not visualized.  Oral cavity is clear. No oral mucosal lesions are identified. Neck is clear without evidence of cervical or supraclavicular adenopathy. Lungs are clear to A&P. Cardiac examination is essentially unremarkable with regular rate and rhythm without murmur rub or thrill. Abdomen is benign with no organomegaly or masses noted. Motor sensory and DTR levels are equal and symmetric in the upper and lower extremities. Cranial nerves II through XII are grossly intact. Proprioception is intact. No peripheral  adenopathy or edema is identified. No motor or sensory levels are noted. Crude visual fields are within normal range.  RADIOLOGY RESULTS: Mammograms are reviewed and compatible with the above-stated findings  PLAN: Present time she continues to do well with no evidence of disease. I'm please were overall progress. I've asked to see her back in 1 year for follow-up. Follow-up mammograms already been ordered. Patient knows to call with any concerns.  I would like to take this opportunity to thank you for allowing me to participate in the care of your patient.Armstead Peaks., MD

## 2017-06-14 DIAGNOSIS — M545 Low back pain: Secondary | ICD-10-CM | POA: Diagnosis not present

## 2017-06-14 DIAGNOSIS — M542 Cervicalgia: Secondary | ICD-10-CM | POA: Diagnosis not present

## 2017-06-17 ENCOUNTER — Encounter: Payer: Self-pay | Admitting: Family Medicine

## 2017-06-17 ENCOUNTER — Ambulatory Visit: Payer: Medicare HMO | Admitting: Family Medicine

## 2017-06-17 VITALS — BP 134/74 | HR 80 | Temp 98.0°F | Ht 62.0 in | Wt 155.1 lb

## 2017-06-17 DIAGNOSIS — M2550 Pain in unspecified joint: Secondary | ICD-10-CM

## 2017-06-17 NOTE — Patient Instructions (Signed)
Please do finish out the prednisone course Call me one week later to give me an update on your symptoms We can check labs then if needed

## 2017-06-17 NOTE — Progress Notes (Signed)
BP 134/74 (BP Location: Left Arm, Patient Position: Sitting, Cuff Size: Large)   Pulse 80   Temp 98 F (36.7 C) (Oral)   Ht 5\' 2"  (1.575 m)   Wt 155 lb 1.6 oz (70.4 kg)   SpO2 98%   BMI 28.37 kg/m    Subjective:    Patient ID: [name], female    DOB: 11/02/1929, 83 y.o.   MRN: [id number]  HPI: [name] is a 83 y.o. female  Chief Complaint  Patient presents with  . Ear Pain  . Diffuse burning    HPI Patient is here with her daughter; daughter says that was worried because another provider told her that she had "infection" all through her body; she saw a PA at the orthopaedist office; he did xrays and that's what she heard, that she had "infection" in her body She is "burning all over" Daughter thinks he arthritis is flaring up She was doing floor cleaner 4 weeks ago She used Zep and was wiping it up with paper towels; no gloves; did fall in it briefly she says I reviewed the information for the product she used and ventilation is needed, but no gloves or mask or eye protection She is on prednisone for her joints; prescribed by Carlynn Spry PA for ortho; saw him 06/14/17 No personal hx of RA  Depression screen [facility] 2/9 06/17/2017 05/17/2017 04/30/2017 01/11/2017 01/04/2017  Decreased Interest 0 0 0 0 0  Down, Depressed, Hopeless 0 0 0 1 1  PHQ - 2 Score 0 0 0 1 1  Altered sleeping - - - - -  Tired, decreased energy - - - - -  Change in appetite - - - - -  Feeling bad or failure about yourself  - - - - -  Trouble concentrating - - - - -  Moving slowly or fidgety/restless - - - - -  Suicidal thoughts - - - - -  PHQ-9 Score - - - - -  Difficult doing work/chores - - - - -  Some recent data might be hidden   Relevant past medical, surgical, family and social history reviewed Past Medical History:  Diagnosis Date  . Anxiety and depression   . Breast cancer ([place]) 2015   LT LUMPECTOMY  . Carpal tunnel syndrome   . Chest pain    SECONDARY TO GERD  . Chronic  interstitial cystitis   . Chronic right hip pain   . Depression   . Diabetes mellitus without complication ([place])   . Diverticulosis   . Dyslipidemia   . Essential hypertension, benign   . Fibromyalgia   . Frequent falls   . GERD (gastroesophageal reflux disease)   . Hearing loss of left ear   . History of fractured rib   . History of TIA (transient ischemic attack)   . Irritable bowel   . Lumbar arthropathy 10/04/2016   On CT scan, refer to ortho  . MS (multiple sclerosis) ([place])   . Osteoarthritis   . Paroxysmal supraventricular tachycardia ([place])   . Peripheral neuropathy    MILD  . Personal history of radiation therapy 2015   BREAST CA  . Protein malnutrition ([place])   . Pure hypercholesterolemia   . Recurrent UTI   . Skin cancer   . SOB (shortness of breath)    SECONDARY TO REFLUX  . Solitary pulmonary nodule on lung CT 12/04/2015   3 mm LLL lung nodule June 2016, March 2017  . Tachycardia,  paroxysmal ([facility])    Reportedly Paroxysmal Supraventricular Tachycardia, but unable to find documentation confirming this  . Thickening of esophagus 01/11/2017   Noted on CT scan June 2018  . Thyroid nodule   . Vertigo   . Vitamin B 12 deficiency    Family History  Problem Relation Age of Onset  . ALS Mother   . Kidney disease Sister   . Arthritis Sister   . Aneurysm Brother   . Heart disease Daughter   . Colon cancer Unknown   . Breast cancer Neg Hx   . Bladder Cancer Neg Hx   . Prostate cancer Neg Hx    Social History   Tobacco Use  . Smoking status: Never Smoker  . Smokeless tobacco: Never Used  Substance Use Topics  . Alcohol use: No    Alcohol/week: 0.0 oz  . Drug use: No   Interim medical history since last visit reviewed. Allergies and medications reviewed  Review of Systems Per HPI unless specifically indicated above     Objective:    BP 134/74 (BP Location: Left Arm, Patient Position: Sitting, Cuff Size: Large)   Pulse 80   Temp 98 F (36.7 C) (Oral)    Ht 5\' 2"  (1.575 m)   Wt 155 lb 1.6 oz (70.4 kg)   SpO2 98%   BMI 28.37 kg/m   Wt Readings from Last 3 Encounters:  06/17/17 155 lb 1.6 oz (70.4 kg)  06/12/17 154 lb 12.2 oz (70.2 kg)  06/05/17 155 lb (70.3 kg)    Physical Exam  Constitutional: She appears well-developed and well-nourished.  HENT:  Mouth/Throat: Mucous membranes are normal.  Eyes: EOM are normal. No scleral icterus.  Cardiovascular: Normal rate and regular rhythm.  Pulmonary/Chest: Effort normal and breath sounds normal. She has no wheezes. She has no rales.  Musculoskeletal:       Right hand: She exhibits normal range of motion, no tenderness, no deformity and no swelling.       Left hand: She exhibits normal range of motion, no tenderness, no deformity and no swelling.  No swelling or erythema of the hands, specifically none of the MCPs; some modest enlargement of the base of the right thumb; normal ROM  Psychiatric: She has a normal mood and affect. Her behavior is normal.       Assessment & Plan:   Problem List Items Addressed This Visit    None    Visit Diagnoses    Arthralgia, unspecified joint    -  Primary   I believe the ortho PA told her that she had "inflammation", not "infection"; finish prednisone; can check labs after if still symptomatic       Follow up plan: No Follow-up on file.  An after-visit summary was printed and given to the patient at [facility].  Please see the patient instructions which may contain other information and recommendations beyond what is mentioned above in the assessment and plan.  Meds ordered this encounter  Medications  . cetirizine (ZYRTEC) 10 MG tablet    Sig: Take 10 mg daily by mouth.   . Cholecalciferol (VITAMIN D-1000 MAX ST) 1000 units tablet    Sig: Take 5,000 Units once a week by mouth.   . methylPREDNISolone (MEDROL DOSEPAK) 4 MG TBPK tablet    Sig: TAKE 6 TABLETS ON DAY 1 AS DIRECTED ON PACKAGE AND DECREASE BY 1 TAB EACH DAY FOR A TOTAL OF 6 DAYS     Refill:  0  . DISCONTD: rosuvastatin (  CRESTOR) 5 MG tablet    Sig: Take by mouth.  . DISCONTD: metFORMIN (GLUCOPHAGE) 500 MG tablet    Sig: Take by mouth.  . DISCONTD: doxycycline (VIBRA-TABS) 100 MG tablet    Sig: Take by mouth.  . dicyclomine (BENTYL) 10 MG capsule    Sig: Take 10 mg as needed by mouth.   . DISCONTD: tiZANidine (ZANAFLEX) 2 MG tablet    Sig: tizanidine 2 mg tablet  Take 1 tablet every 6 hours by oral route.  Marland Kitchen tiZANidine (ZANAFLEX) 2 MG tablet    Sig: Take 2 mg every 6 (six) hours by mouth.    Refill:  1  . Incontinence Supply Disposable (DEPEND UNDERWEAR LARGE) MISC  . ferrous sulfate 325 (65 FE) MG tablet    Sig: Take 325 mg daily by mouth.   Marland Kitchen LACTOBACILLUS PO    Sig: Take daily by mouth.     No orders of the defined types were placed in this encounter.

## 2017-08-19 DIAGNOSIS — M545 Low back pain: Secondary | ICD-10-CM | POA: Diagnosis not present

## 2017-08-21 DIAGNOSIS — F329 Major depressive disorder, single episode, unspecified: Secondary | ICD-10-CM | POA: Diagnosis not present

## 2017-08-21 DIAGNOSIS — G309 Alzheimer's disease, unspecified: Secondary | ICD-10-CM | POA: Diagnosis not present

## 2017-08-21 DIAGNOSIS — M545 Low back pain: Secondary | ICD-10-CM | POA: Diagnosis not present

## 2017-08-21 DIAGNOSIS — Z8659 Personal history of other mental and behavioral disorders: Secondary | ICD-10-CM | POA: Diagnosis not present

## 2017-08-21 DIAGNOSIS — G8929 Other chronic pain: Secondary | ICD-10-CM | POA: Diagnosis not present

## 2017-08-21 DIAGNOSIS — E538 Deficiency of other specified B group vitamins: Secondary | ICD-10-CM | POA: Diagnosis not present

## 2017-08-21 DIAGNOSIS — G4700 Insomnia, unspecified: Secondary | ICD-10-CM | POA: Diagnosis not present

## 2017-08-21 DIAGNOSIS — F0150 Vascular dementia without behavioral disturbance: Secondary | ICD-10-CM | POA: Diagnosis not present

## 2017-08-21 DIAGNOSIS — F419 Anxiety disorder, unspecified: Secondary | ICD-10-CM | POA: Diagnosis not present

## 2017-09-04 DIAGNOSIS — B309 Viral conjunctivitis, unspecified: Secondary | ICD-10-CM | POA: Diagnosis not present

## 2017-09-24 NOTE — Progress Notes (Signed)
Closing out abstraction from 2018

## 2017-09-24 NOTE — Progress Notes (Signed)
Closing out lab/order note open since:  June 2018 

## 2017-10-01 ENCOUNTER — Other Ambulatory Visit: Payer: Self-pay | Admitting: Family Medicine

## 2017-10-02 DIAGNOSIS — M5416 Radiculopathy, lumbar region: Secondary | ICD-10-CM | POA: Diagnosis not present

## 2017-10-04 ENCOUNTER — Other Ambulatory Visit: Payer: Self-pay | Admitting: Family Medicine

## 2017-10-17 DIAGNOSIS — M75120 Complete rotator cuff tear or rupture of unspecified shoulder, not specified as traumatic: Secondary | ICD-10-CM | POA: Diagnosis not present

## 2017-10-17 DIAGNOSIS — M48061 Spinal stenosis, lumbar region without neurogenic claudication: Secondary | ICD-10-CM | POA: Diagnosis not present

## 2017-11-16 DIAGNOSIS — M5416 Radiculopathy, lumbar region: Secondary | ICD-10-CM | POA: Diagnosis not present

## 2017-11-25 ENCOUNTER — Other Ambulatory Visit: Payer: Self-pay | Admitting: Family Medicine

## 2017-11-25 ENCOUNTER — Other Ambulatory Visit: Payer: Self-pay | Admitting: Gastroenterology

## 2017-11-25 NOTE — Telephone Encounter (Signed)
Reviewed last sgpt and lipids; rx approved 

## 2017-12-10 DIAGNOSIS — M5416 Radiculopathy, lumbar region: Secondary | ICD-10-CM | POA: Diagnosis not present

## 2017-12-19 DIAGNOSIS — F0280 Dementia in other diseases classified elsewhere without behavioral disturbance: Secondary | ICD-10-CM | POA: Diagnosis not present

## 2017-12-19 DIAGNOSIS — Z8659 Personal history of other mental and behavioral disorders: Secondary | ICD-10-CM | POA: Diagnosis not present

## 2017-12-19 DIAGNOSIS — G309 Alzheimer's disease, unspecified: Secondary | ICD-10-CM | POA: Diagnosis not present

## 2017-12-19 DIAGNOSIS — F0150 Vascular dementia without behavioral disturbance: Secondary | ICD-10-CM | POA: Diagnosis not present

## 2017-12-19 DIAGNOSIS — G4700 Insomnia, unspecified: Secondary | ICD-10-CM | POA: Diagnosis not present

## 2017-12-19 DIAGNOSIS — E538 Deficiency of other specified B group vitamins: Secondary | ICD-10-CM | POA: Diagnosis not present

## 2018-01-02 ENCOUNTER — Emergency Department: Payer: Medicare HMO

## 2018-01-02 ENCOUNTER — Encounter: Payer: Self-pay | Admitting: Family Medicine

## 2018-01-02 ENCOUNTER — Encounter: Payer: Self-pay | Admitting: Emergency Medicine

## 2018-01-02 ENCOUNTER — Emergency Department
Admission: EM | Admit: 2018-01-02 | Discharge: 2018-01-02 | Disposition: A | Discharge status: Home / Self Care | Payer: Medicare HMO | Attending: Emergency Medicine | Admitting: Emergency Medicine

## 2018-01-02 ENCOUNTER — Ambulatory Visit (INDEPENDENT_AMBULATORY_CARE_PROVIDER_SITE_OTHER): Payer: Medicare HMO | Admitting: Family Medicine

## 2018-01-02 VITALS — BP 130/70 | HR 95 | Temp 98.4°F | Resp 14 | Ht 62.0 in | Wt 158.2 lb

## 2018-01-02 DIAGNOSIS — E119 Type 2 diabetes mellitus without complications: Secondary | ICD-10-CM | POA: Diagnosis not present

## 2018-01-02 DIAGNOSIS — I10 Essential (primary) hypertension: Secondary | ICD-10-CM | POA: Diagnosis not present

## 2018-01-02 DIAGNOSIS — Z85828 Personal history of other malignant neoplasm of skin: Secondary | ICD-10-CM | POA: Insufficient documentation

## 2018-01-02 DIAGNOSIS — Z853 Personal history of malignant neoplasm of breast: Secondary | ICD-10-CM | POA: Diagnosis not present

## 2018-01-02 DIAGNOSIS — Z66 Do not resuscitate: Secondary | ICD-10-CM | POA: Diagnosis not present

## 2018-01-02 DIAGNOSIS — Z8673 Personal history of transient ischemic attack (TIA), and cerebral infarction without residual deficits: Secondary | ICD-10-CM | POA: Diagnosis not present

## 2018-01-02 DIAGNOSIS — Z7982 Long term (current) use of aspirin: Secondary | ICD-10-CM | POA: Diagnosis not present

## 2018-01-02 DIAGNOSIS — R1011 Right upper quadrant pain: Secondary | ICD-10-CM

## 2018-01-02 DIAGNOSIS — K5792 Diverticulitis of intestine, part unspecified, without perforation or abscess without bleeding: Secondary | ICD-10-CM | POA: Diagnosis not present

## 2018-01-02 DIAGNOSIS — Z79899 Other long term (current) drug therapy: Secondary | ICD-10-CM | POA: Diagnosis not present

## 2018-01-02 DIAGNOSIS — R1084 Generalized abdominal pain: Secondary | ICD-10-CM | POA: Diagnosis not present

## 2018-01-02 DIAGNOSIS — E1140 Type 2 diabetes mellitus with diabetic neuropathy, unspecified: Secondary | ICD-10-CM

## 2018-01-02 DIAGNOSIS — K5730 Diverticulosis of large intestine without perforation or abscess without bleeding: Secondary | ICD-10-CM | POA: Diagnosis not present

## 2018-01-02 DIAGNOSIS — R140 Abdominal distension (gaseous): Secondary | ICD-10-CM | POA: Diagnosis not present

## 2018-01-02 LAB — LIPASE, BLOOD: Lipase: 26 U/L (ref 11–51)

## 2018-01-02 LAB — COMPREHENSIVE METABOLIC PANEL
ALT: 24 U/L (ref 14–54)
AST: 14 U/L — ABNORMAL LOW (ref 15–41)
Albumin: 4 g/dL (ref 3.5–5.0)
Alkaline Phosphatase: 47 U/L (ref 38–126)
Anion gap: 10 (ref 5–15)
BILIRUBIN TOTAL: 0.5 mg/dL (ref 0.3–1.2)
BUN: 21 mg/dL — ABNORMAL HIGH (ref 6–20)
CO2: 25 mmol/L (ref 22–32)
Calcium: 9.5 mg/dL (ref 8.9–10.3)
Chloride: 105 mmol/L (ref 101–111)
Creatinine, Ser: 0.93 mg/dL (ref 0.44–1.00)
GFR, EST NON AFRICAN AMERICAN: 54 mL/min — AB (ref >60)
Glucose, Bld: 255 mg/dL — ABNORMAL HIGH (ref 65–99)
POTASSIUM: 4.5 mmol/L (ref 3.5–5.1)
Sodium: 140 mmol/L (ref 135–145)
TOTAL PROTEIN: 7.2 g/dL (ref 6.5–8.1)

## 2018-01-02 LAB — CBC
HEMATOCRIT: 39.1 % (ref 35.0–47.0)
HEMOGLOBIN: 13 g/dL (ref 12.0–16.0)
MCH: 31 pg (ref 26.0–34.0)
MCHC: 33.3 g/dL (ref 32.0–36.0)
MCV: 93.2 fL (ref 80.0–100.0)
Platelets: 160 10*3/uL (ref 150–440)
RBC: 4.19 MIL/uL (ref 3.80–5.20)
RDW: 14.7 % — AB (ref 11.5–14.5)
WBC: 4.6 10*3/uL (ref 3.6–11.0)

## 2018-01-02 LAB — URINALYSIS, COMPLETE (UACMP) WITH MICROSCOPIC
Bilirubin Urine: NEGATIVE
GLUCOSE, UA: NEGATIVE mg/dL
Ketones, ur: NEGATIVE mg/dL
NITRITE: NEGATIVE
PROTEIN: NEGATIVE mg/dL
Specific Gravity, Urine: 1.019 (ref 1.005–1.030)
pH: 5 (ref 5.0–8.0)

## 2018-01-02 LAB — TROPONIN I: Troponin I: <0.03 ng/mL (ref <0.03)

## 2018-01-02 MED ORDER — OXYCODONE HCL 5 MG PO TABS
2.5000 mg | ORAL_TABLET | ORAL | Status: AC
  Administered 2018-01-02: 2.5 mg via ORAL
  Filled 2018-01-02: qty 1

## 2018-01-02 MED ORDER — AMOXICILLIN-POT CLAVULANATE 875-125 MG PO TABS
1.0000 | ORAL_TABLET | Freq: Once | ORAL | Status: AC
  Administered 2018-01-02: 1 via ORAL
  Filled 2018-01-02: qty 1

## 2018-01-02 MED ORDER — ONDANSETRON HCL 4 MG PO TABS: 4.0000 mg | ORAL_TABLET | Freq: Every day | ORAL | 0 refills | Status: AC | PRN

## 2018-01-02 MED ORDER — OXYCODONE-ACETAMINOPHEN 5-325 MG PO TABS: 0.5000 | ORAL_TABLET | ORAL | 0 refills | Status: DC | PRN

## 2018-01-02 MED ORDER — ONDANSETRON HCL 4 MG/2ML IJ SOLN
4.0000 mg | Freq: Once | INTRAMUSCULAR | Status: AC
  Administered 2018-01-02: 4 mg via INTRAVENOUS
  Filled 2018-01-02: qty 2

## 2018-01-02 MED ORDER — MORPHINE SULFATE (PF) 2 MG/ML IV SOLN
2.0000 mg | Freq: Once | INTRAVENOUS | Status: DC
  Filled 2018-01-02: qty 1

## 2018-01-02 MED ORDER — AMOXICILLIN-POT CLAVULANATE 875-125 MG PO TABS: 1.0000 | ORAL_TABLET | Freq: Two times a day (BID) | ORAL | 0 refills | Status: DC

## 2018-01-02 NOTE — Patient Instructions (Addendum)
Do NOT gargle with hydrogen peroxide Plain sitz baths for the private region  Please go now to the emergency department for your abdominal pain and distension; they can evaluate your abdomen

## 2018-01-02 NOTE — ED Provider Notes (Signed)
[facility] Emergency Department Provider Note  ____________________________________________   First MD Initiated Contact with Patient 01/02/18 1923     (approximate)  I have reviewed the triage vital signs and the nursing notes.   HISTORY  Chief Complaint Abdominal Pain and Nausea    HPI [name] is a 84 y.o. female presents for evaluation of abdominal pain  Patient comes from clinic, reports she had about 3 days of abdominal pain.  She reports is having nausea.  Her stomach is seems swollen.  She did have a normal bowel movement today.  She has not felt constipated.  She saw her primary care doctor for a follow-up regarding vaginitis and hormone's potentially, but reported her symptoms and was referred to the ER for further evaluation.  No fevers or chills but she has felt warm and sweaty at night for the last couple nights.  Also reports a history of diverticulitis.  Pain is moderate, worsened with movement and trying to walk.  No vomiting.  No chest pain.  No trouble breathing   Past Medical History:  Diagnosis Date  . Anxiety and depression   . Breast cancer ([place]) 2015   LT LUMPECTOMY  . Carpal tunnel syndrome   . Chest pain    SECONDARY TO GERD  . Chronic interstitial cystitis   . Chronic right hip pain   . Depression   . Diabetes mellitus without complication ([place])   . Diverticulosis   . Dyslipidemia   . Essential hypertension, benign   . Fibromyalgia   . Frequent falls   . GERD (gastroesophageal reflux disease)   . Hearing loss of left ear   . History of fractured rib   . History of TIA (transient ischemic attack)   . Irritable bowel   . Lumbar arthropathy 10/04/2016   On CT scan, refer to ortho  . MS (multiple sclerosis) ([place])   . Osteoarthritis   . Paroxysmal supraventricular tachycardia ([place])   . Peripheral neuropathy    MILD  . Personal history of radiation therapy 2015   BREAST CA  . Protein malnutrition ([place])     . Pure hypercholesterolemia   . Recurrent UTI   . Skin cancer   . SOB (shortness of breath)    SECONDARY TO REFLUX  . Solitary pulmonary nodule on lung CT 12/04/2015   3 mm LLL lung nodule June 2016, March 2017  . Tachycardia, paroxysmal ([facility])    Reportedly Paroxysmal Supraventricular Tachycardia, but unable to find documentation confirming this  . Thickening of esophagus 01/11/2017   Noted on CT scan June 2018  . Thyroid nodule   . Vertigo   . Vitamin B 12 deficiency     Patient Active Problem List   Diagnosis Date Noted  . DNAR (do not attempt resuscitation) 04/30/2017  . Preventative health care 04/30/2017  . Thickening of esophagus 01/11/2017  . Lumbar arthropathy 10/04/2016  . Vertigo, aural, left 07/31/2016  . Rectal pain 06/25/2016  . External hemorrhoid 06/18/2016  . Head injury due to trauma 05/09/2016  . Neoplasm of uncertain behavior of skin 05/09/2016  . Hemorrhoids 04/24/2016  . Dementia 04/24/2016  . Medication monitoring encounter 04/03/2016  . Gait abnormality 04/03/2016  . Skin lump of leg 04/03/2016  . Pain in right knee 02/15/2016  . Pain and swelling of right knee 02/13/2016  . Right knee sprain 02/13/2016  . Solitary pulmonary nodule on lung CT 12/04/2015  . Generalized degenerative joint disease of hand 11/21/2015  .  Triggering of digit 11/21/2015  . Snapping thumb syndrome 11/21/2015  . Pain, joint, hand 11/11/2015  . Coronary artery calcification seen on CAT scan 11/11/2015  . Multiple sclerosis ([place]) 11/11/2015  . Chronic pain of multiple joints 09/02/2015  . Right lower quadrant abdominal pain 08/02/2015  . Microscopic hematuria 07/12/2015  . Recurrent UTI 04/09/2015  . Carpal tunnel syndrome 01/12/2014  . DD (diverticular disease) 01/12/2014  . Ductal carcinoma in situ of left breast 01/12/2014  . Hypercholesteremia 01/12/2014  . DS (disseminated sclerosis) ([place]) 01/12/2014  . Arthritis, degenerative 01/12/2014  . Thyroid nodule 01/12/2014   . Cyanocobalamine deficiency (non anemic) 01/12/2014  . Anxiety and depression 01/12/2014  . Intraductal carcinoma of breast 01/12/2014  . History of right mastectomy 01/12/2014  . DM (diabetes mellitus) type II controlled, neurological manifestation ([place]) 06/26/2010  . Depression, major, recurrent, in partial remission ([place]) 06/26/2010  . Frequent falls 06/26/2010  . Stricture and stenosis of esophagus 06/26/2010  . Gastro-esophageal reflux disease without esophagitis 06/26/2010  . Fibromyalgia 06/26/2010  . Chronic interstitial cystitis 06/26/2010  . H/O malignant neoplasm of breast 06/26/2010  . Hypertension goal BP (blood pressure) < 140/90 06/01/2009  . H/O paroxysmal supraventricular tachycardia 06/01/2009  . Paroxysmal supraventricular tachycardia ([place]) 06/01/2009  . Irritable bowel syndrome with constipation and diarrhea 06/16/2008  . Diarrhea, functional 06/16/2008  . Diarrhea, unspecified 06/16/2008    Past Surgical History:  Procedure Laterality Date  . APPENDECTOMY    . AUGMENTATION MAMMAPLASTY Right 1975   BREAST BREAST ONLY  . BLADDER TACKING     X 3  . BREAST LUMPECTOMY    . BREAST SURGERY    . CARDIAC CATHETERIZATION  10/2001   Normal coronary arteries with the exception of 20% proximal D1  . CHOLECYSTECTOMY    . COLONOSCOPY WITH PROPOFOL N/A 01/21/2015   Procedure: COLONOSCOPY WITH PROPOFOL;  Surgeon: Josefine Class, MD;  Location[facility] ENDOSCOPY;  Service: Endoscopy;  Laterality: N/A;  . ESOPHAGOGASTRODUODENOSCOPY N/A 01/21/2015   Procedure: ESOPHAGOGASTRODUODENOSCOPY (EGD);  Surgeon: Josefine Class, MD;  Location: [facility] ENDOSCOPY;  Service: Endoscopy;  Laterality: N/A;  . ESOPHAGOGASTRODUODENOSCOPY (EGD) WITH PROPOFOL N/A 04/01/2017   Procedure: ESOPHAGOGASTRODUODENOSCOPY (EGD) WITH PROPOFOL WITH BANDING;  Surgeon: Jonathon Bellows, MD;  Location: [facility] ENDOSCOPY;  Service: Gastroenterology;  Laterality: N/A;  . EYE SURGERIES     WITH BUCKLE DETACHMENT OF  THE RETINA  . FLEXIBLE SIGMOIDOSCOPY N/A 06/05/2017   Procedure: FLEXIBLE SIGMOIDOSCOPY WITHOUT SEDATION;  Surgeon: Jonathon Bellows, MD;  Location[facility] ENDOSCOPY;  Service: Gastroenterology;  Laterality: N/A;  . heart monitor     Not being used... Patient can not have a MRI  . HEMORRHOID SURGERY     WITH RECONSTRUCTION  . MASTECTOMY Right 1970   SUBCUTANEOUS - NO BREAST CANCER  . TONSILLECTOMY    . TOTAL ABDOMINAL HYSTERECTOMY W/ BILATERAL SALPINGOOPHORECTOMY    . TYMPANOPLASTY      Prior to Admission medications   Medication Sig Start Date End Date Taking? Authorizing Provider  acetaminophen (TYLENOL) 500 MG tablet Take 500 mg by mouth every 8 (eight) hours as needed.     [provider]  amLODipine (NORVASC) 2.5 MG tablet TAKE 1 TABLET (2.5 MG TOTAL) BY MOUTH DAILY. 10/01/17   Arnetha Courser, MD  amoxicillin-clavulanate (AUGMENTIN) 875-125 MG tablet Take 1 tablet by mouth 2 (two) times daily. 01/02/18   Delman Kitten, MD  aspirin 81 MG tablet Take 81 mg by mouth daily.  03/07/17   [provider]  atorvastatin (LIPITOR)  40 MG tablet TAKE 1 TABLET BY MOUTH EVERYDAY AT BEDTIME 11/25/17   Lada, Satira Anis, MD  b complex vitamins tablet Take 1 tablet by mouth daily.    [provider]  Carboxymethylcell-Hypromellose (GENTEAL OP) Apply 1 drop to eye. Each eye as needed    [provider]  cetirizine (ZYRTEC) 10 MG tablet Take 10 mg daily by mouth.     [provider]  Cholecalciferol (VITAMIN D-1000 MAX ST) 1000 units tablet Take 5,000 Units once a week by mouth.     [provider]  Cyanocobalamin (VITAMIN B-12) 1000 MCG SUBL daily.  09/16/15   [provider]  dicyclomine (BENTYL) 10 MG capsule Take 10 mg as needed by mouth.  01/21/15   [provider]  DULoxetine (CYMBALTA) 20 MG capsule Take 20 mg by mouth daily. 12/19/17   [provider]  DULoxetine (CYMBALTA) 30 MG capsule Take 30 mg by mouth daily. 12/19/17 12/19/18   [provider]  ferrous sulfate 325 (65 FE) MG tablet Take 325 mg daily by mouth.     [provider]  fluticasone (FLONASE) 50 MCG/ACT nasal spray SPRAY 2 SPRAYS EACH INTO BOTH NOSTRILS ONCE prn 10/19/15   [provider]  Incontinence Supply Disposable (DEPEND UNDERWEAR LARGE) MISC  06/10/17   [provider]  LACTOBACILLUS PO Take daily by mouth.     [provider]  loratadine (CLARITIN) 10 MG tablet Take 10 mg by mouth daily.    [provider]  Loratadine 10 MG CAPS Take 10 mg by mouth daily.  03/07/17   [provider]  meclizine (ANTIVERT) 25 MG tablet TAKE 1 TABLET (25 MG TOTAL) BY MOUTH EVERY 8 (EIGHT) HOURS AS NEEDED FOR DIZZINESS. 10/04/17   Lada, Satira Anis, MD  memantine (NAMENDA) 10 MG tablet Take 10 mg by mouth 2 (two) times daily.  08/11/16   [provider]  methylPREDNISolone (MEDROL DOSEPAK) 4 MG TBPK tablet TAKE 6 TABLETS ON DAY 1 AS DIRECTED ON PACKAGE AND DECREASE BY 1 TAB EACH DAY FOR A TOTAL OF 6 DAYS 06/14/17   [provider]  neomycin-polymyxin b-dexamethasone (MAXITROL) 3.5-10000-0.1 OINT PLACE SMALL RIBBON IN BOTH EYES 2 TIMES A DAY FOR 14 DAYS 02/28/17   [provider]  omeprazole (PRILOSEC) 20 MG capsule TAKE 1 CAPSULE BY MOUTH EVERY DAY 11/25/17   Jonathon Bellows, MD  ondansetron (ZOFRAN) 4 MG tablet Take 1 tablet (4 mg total) by mouth daily as needed for nausea or vomiting. 01/02/18 01/02/19  Delman Kitten, MD  oxyCODONE-acetaminophen (PERCOCET) 5-325 MG tablet Take 0.5 tablets by mouth every 4 (four) hours as needed for moderate pain or severe pain. 01/02/18 01/02/19  Delman Kitten, MD  polyethylene glycol powder (GLYCOLAX/MIRALAX) powder 17 grams daily 04/22/17   Jonathon Bellows, MD  Probiotic Product (PROBIOTIC COLON SUPPORT) CAPS Take 1 capsule by mouth daily.     [provider]  sertraline (ZOLOFT) 50 MG tablet TAKE 1 TABLET (50 MG TOTAL) BY MOUTH DAILY. 03/25/17   Arnetha Courser, MD    tiZANidine (ZANAFLEX) 2 MG tablet Take 2 mg every 6 (six) hours by mouth. 06/14/17   [provider]    Allergies Aricept Reather L[place]ler hcl]; Biaxin [clarithromycin]; Copaxone [glatiramer acetate]; Decadron [dexamethasone]; Dexamethasone sodium phosphate; Diltiazem hcl; Diltiazem hcl; Flagyl [metronidazole]; Gabapentin; Iohexol; Ivp dye [iodinated diagnostic agents]; Levothyroxine; Macrobid [nitrofurantoin macrocrystal]; Sulfa antibiotics; Sulfonamide derivatives; Synthroid [levothyroxine sodium]; Copaxone  [glatiramer]; and Interferon beta-1a  Family History  Problem Relation Age of  Onset  . ALS Mother   . Kidney disease Sister   . Arthritis Sister   . Aneurysm Brother   . Heart disease Daughter   . Colon cancer Unknown   . Breast cancer Neg Hx   . Bladder Cancer Neg Hx   . Prostate cancer Neg Hx     Social History Social History   Tobacco Use  . Smoking status: Never Smoker  . Smokeless tobacco: Never Used  Substance Use Topics  . Alcohol use: No    Alcohol/week: 0.0 oz  . Drug use: No    Review of Systems Constitutional: No fever/chills but increased fatigue, sweats at night the last 2 nights Eyes: No visual changes. ENT: No sore throat. Cardiovascular: Denies chest pain. Respiratory: Denies shortness of breath. Gastrointestinal: No vomiting or diarrhea.  Last bowel movement was this morning and was formed. Genitourinary: Negative for dysuria. Musculoskeletal: Negative for back pain. Skin: Negative for rash. Neurological: Negative for headaches, focal weakness or numbness.    ____________________________________________   PHYSICAL EXAM:  VITAL SIGNS: ED Triage Vitals [01/02/18 1550]  Enc Vitals Group     BP 133/73     Pulse Rate 83     Resp 16     Temp 98.6 F (37 C)     Temp Source Oral     SpO2 97 %     Weight 168 lb (76.2 kg)     Height 5\' 2"  (1.575 m)     Head Circumference      Peak Flow      Pain Score 9     Pain Loc      Pain Edu?       Excl. in [place]?     Constitutional: Alert and oriented. Well appearing and in no acute distress.  Patient and daughter both very pleasant.  She is noted to be holding her hands over her abdomen she appears somewhat protuberant Eyes: Conjunctivae are normal. Head: Atraumatic. Nose: No congestion/rhinnorhea. Mouth/Throat: Mucous membranes are moist. Neck: No stridor.   Cardiovascular: Normal rate, regular rhythm. Grossly normal heart sounds.  Good peripheral circulation. Respiratory: Normal respiratory effort.  No retractions. Lungs CTAB. Gastrointestinal: Soft to very mildly distended overall.  She also reports tenderness throughout, slightly worse over the left.  Some mild peritonitis over the left side of the abdomen is also denoted.  There is positive bowel sounds.  Some mild rebound tenderness in the left abdomen.  No severe peritonitis. Musculoskeletal: No lower extremity tenderness nor edema. Neurologic:  Normal speech and language. No gross focal neurologic deficits are appreciated.  Skin:  Skin is warm, dry and intact. No rash noted. Psychiatric: Mood and affect are normal. Speech and behavior are normal.  ____________________________________________   LABS (all labs ordered are listed, but only abnormal results are displayed)  Labs Reviewed  COMPREHENSIVE METABOLIC PANEL - Abnormal; Notable for the following components:      Result Value   Glucose, Bld 255 (*)    BUN 21 (*)    AST 14 (*)    GFR calc non Af Amer 54 (*)    All other components within normal limits  CBC - Abnormal; Notable for the following components:   RDW 14.7 (*)    All other components within normal limits  URINALYSIS, COMPLETE (UACMP) WITH MICROSCOPIC - Abnormal; Notable for the following components:   Color, Urine YELLOW (*)    APPearance HAZY (*)    Hgb urine dipstick SMALL (*)    Leukocytes,  UA SMALL (*)    Bacteria, UA MANY (*)    All other components within normal limits  URINE CULTURE  LIPASE,  BLOOD  TROPONIN I   ____________________________________________  EKG  ED ECG REPORT I, Delman Kitten, the attending physician, personally viewed and interpreted this ECG.  Date: 01/02/2018 EKG Time: 1600 Rate: 80 Rhythm: normal sinus rhythm QRS Axis: normal Intervals: normal ST/T Wave abnormalities: normal Narrative Interpretation: no evidence of acute ischemia  ____________________________________________  RADIOLOGY  CT scan reviewed No acute findings.  Of note there is diverticulosis noted.  In the setting of her clinical examination, diverticulosis, and mild left-sided peritonitis I suspect there could be very early diverticulitis because of her pain today.  There is no evidence of obstructive pathology.  Her clinical examination, hemodynamics and lab work is reassuring.  Urine sent for culture, though no definitive symptoms of UTI. ____________________________________________   PROCEDURES  Procedure(s) performed: None  Procedures  Critical Care performed: No  ____________________________________________   INITIAL IMPRESSION / ASSESSMENT AND PLAN / ED COURSE  Pertinent labs & imaging results that were available during my care of the patient were reviewed by me and considered in my medical decision making (see chart for details).  Differential diagnosis includes but is not limited to, abdominal perforation, aortic dissection, cholecystitis, appendicitis, diverticulitis, colitis, esophagitis/gastritis, kidney stone, pyelonephritis, urinary tract infection, aortic aneurysm. All are considered in decision and treatment plan. Based upon the patient's presentation and risk factors, concerned about possible obstructive pathology, diverticulitis, or other acute intra-abdominal etiology or infection.  Patient has severe IV contrast allergy.  We will proceed with noncontrast CT to further evaluate.  No associated cardiac or pulmonary symptoms.  Normal EKG.  Urinalysis is positive  for some bacteria and slight amount of inflammatory cells, is slightly contaminated as well but many bacteria seen.  Sent for culture.    Clinical Course as of Jan 03 2127  Thu Jan 02, 2018  2115 Discussed with Dr. Sanda Klein. Dr. Sanda Klein agreeable with outpatient therapy (reviewed labs, history, CT) and will treat with Augmentin BID. They will see her at 4pm tomorrow for a follow-up appointment. Patient and family agreeable for 4pm follow-up tomorrow as well.    [MQ]    Clinical Course User Index [MQ] Delman Kitten, MD   ----------------------------------------- 9:27 PM on 01/02/2018 -----------------------------------------  Patient and family very agreeable with plan for discharge.  Patient's daughter controls her medications, reports she is not currently taking any Percocet or oxycodone.  Will prescribe a very short course to get her to Dr. Sharmaine Base appointment tomorrow for pain medication.  Discussed risks benefits and safe use.  Will discharge with Augmentin, Zofran and Percocet.  Patient's daughter will be taking her home and they will be following up tomorrow at 4 PM with a plan to return to the ER in the interim if worsening or new concerns.  Return precautions and treatment recommendations and follow-up discussed with the patient who is agreeable with the plan.   ____________________________________________   FINAL CLINICAL IMPRESSION(S) / ED DIAGNOSES  Final diagnoses:  Generalized abdominal pain  Acute diverticulitis of intestine      NEW MEDICATIONS STARTED DURING THIS VISIT:  New Prescriptions   AMOXICILLIN-CLAVULANATE (AUGMENTIN) 875-125 MG TABLET    Take 1 tablet by mouth 2 (two) times daily.   ONDANSETRON (ZOFRAN) 4 MG TABLET    Take 1 tablet (4 mg total) by mouth daily as needed for nausea or vomiting.   OXYCODONE-ACETAMINOPHEN (PERCOCET) 5-325 MG TABLET  Take 0.5 tablets by mouth every 4 (four) hours as needed for moderate pain or severe pain.     Note:  This  document was prepared using Dragon voice recognition software and may include unintentional dictation errors.     Delman Kitten, MD 01/02/18 2128

## 2018-01-02 NOTE — ED Triage Notes (Signed)
Patient presents to the ED with abdominal pain and nausea x 3 days.  Patient reports hard bowel movement this morning.  Patient was sent by her PCP who told patient she likely needed a CT scan because her abdomen, "sounded funny."  Patient is in no obvious distress at this time.  Ambulatory with steady gait to triage with a walker.  Patient denies any vomiting.

## 2018-01-02 NOTE — ED Notes (Signed)

## 2018-01-02 NOTE — ED Notes (Signed)
E-signature not responding, pt signed hard copy and placed on chart

## 2018-01-02 NOTE — Progress Notes (Signed)
BP 130/70   Pulse 95   Temp 98.4 F (36.9 C) (Oral)   Resp 14   Ht 5\' 2"  (1.575 m)   Wt 158 lb 3.2 oz (71.8 kg)   SpO2 94%   BMI 28.94 kg/m    Subjective:    Patient ID: [name], female    DOB: [birth year], 84 y.o.   MRN: [id number]  HPI: [name] is a 84 y.o. female  Chief Complaint  Patient presents with  . Follow-up    Daughter concerned with the use of hydrogen perioxide mouth rinse daily  . vaginal burning    questions about using various diffrent kinds of vaginal cream daily  . nasal spray    Pt does not like taking generic meds but wants you to let pt know that generic flonase is the same thing and cheaper  . Hemorrhoids    HPI Patient is here for f/u with her daughter  Vaginal burning; she has not had any burning of the urine; constantly buying her vaginal cream, A&D 10 and hemorrhoidal cream and they are constantly buying stuff; she had a cystoscopy and that was fine; he said she was using the wrong kind of toilet paper; everything was red; inflamed; not seeing a gynecologist  Hemorrhoids; no bleeding; she is complaining of them  She had discussed H2O2; she gargles with H2O2 and daughter wants me to tell her to stop; had diphtheria at age 47, brother died at age 51 from that and she has apparently been gargling ever since  Seeing neurologist; memory, mixed Alzheimer's  Now on 20 mg cymbalta, and will go up to 30 mg daily soon; Rxd by another provider  Depression screen [facility] 2/9 01/02/2018 06/17/2017 05/17/2017 04/30/2017 01/11/2017  Decreased Interest 0 0 0 0 0  Down, Depressed, Hopeless 1 0 0 0 1  PHQ - 2 Score 1 0 0 0 1  Altered sleeping 1 - - - -  Tired, decreased energy 2 - - - -  Change in appetite 1 - - - -  Feeling bad or failure about yourself  0 - - - -  Trouble concentrating 2 - - - -  Moving slowly or fidgety/restless - - - - -  Suicidal thoughts 0 - - - -  PHQ-9 Score 7 - - - -  Difficult doing work/chores Somewhat difficult - - - -   Some recent data might be hidden    Relevant past medical, surgical, family and social history reviewed Past Medical History:  Diagnosis Date  . Anxiety and depression   . Breast cancer ([place]) 2015   LT LUMPECTOMY  . Carpal tunnel syndrome   . Chest pain    SECONDARY TO GERD  . Chronic interstitial cystitis   . Chronic right hip pain   . Depression   . Diabetes mellitus without complication ([place])   . Diverticulosis   . Dyslipidemia   . Essential hypertension, benign   . Fibromyalgia   . Frequent falls   . GERD (gastroesophageal reflux disease)   . Hearing loss of left ear   . History of fractured rib   . History of TIA (transient ischemic attack)   . Irritable bowel   . Lumbar arthropathy 10/04/2016   On CT scan, refer to ortho  . MS (multiple sclerosis) ([place])   . Osteoarthritis   . Paroxysmal supraventricular tachycardia ([place])   . Peripheral neuropathy    MILD  . Personal history of radiation therapy 2015  BREAST CA  . Protein malnutrition ([place])   . Pure hypercholesterolemia   . Recurrent UTI   . Skin cancer   . SOB (shortness of breath)    SECONDARY TO REFLUX  . Solitary pulmonary nodule on lung CT 12/04/2015   3 mm LLL lung nodule June 2016, March 2017  . Tachycardia, paroxysmal (HCC)    Reportedly Paroxysmal Supraventricular Tachycardia, but unable to find documentation confirming this  . Thickening of esophagus 01/11/2017   Noted on CT scan June 2018  . Thyroid nodule   . Vertigo   . Vitamin B 12 deficiency    Past Surgical History:  Procedure Laterality Date  . APPENDECTOMY    . AUGMENTATION MAMMAPLASTY Right 1975   BREAST BREAST ONLY  . BLADDER TACKING     X 3  . BREAST LUMPECTOMY    . BREAST SURGERY    . CARDIAC CATHETERIZATION  10/2001   Normal coronary arteries with the exception of 20% proximal D1  . CHOLECYSTECTOMY    . COLONOSCOPY WITH PROPOFOL N/A 01/21/2015   Procedure: COLONOSCOPY WITH PROPOFOL;  Surgeon: Josefine Class, MD;  Location:  [facility] ENDOSCOPY;  Service: Endoscopy;  Laterality: N/A;  . ESOPHAGOGASTRODUODENOSCOPY N/A 01/21/2015   Procedure: ESOPHAGOGASTRODUODENOSCOPY (EGD);  Surgeon: Josefine Class, MD;  Location: [facility] ENDOSCOPY;  Service: Endoscopy;  Laterality: N/A;  . ESOPHAGOGASTRODUODENOSCOPY (EGD) WITH PROPOFOL N/A 04/01/2017   Procedure: ESOPHAGOGASTRODUODENOSCOPY (EGD) WITH PROPOFOL WITH BANDING;  Surgeon: Jonathon Bellows, MD;  Location: [facility] ENDOSCOPY;  Service: Gastroenterology;  Laterality: N/A;  . EYE SURGERIES     WITH BUCKLE DETACHMENT OF THE RETINA  . FLEXIBLE SIGMOIDOSCOPY N/A 06/05/2017   Procedure: FLEXIBLE SIGMOIDOSCOPY WITHOUT SEDATION;  Surgeon: Jonathon Bellows, MD;  Location[facility] ENDOSCOPY;  Service: Gastroenterology;  Laterality: N/A;  . heart monitor     Not being used... Patient can not have a MRI  . HEMORRHOID SURGERY     WITH RECONSTRUCTION  . MASTECTOMY Right 1970   SUBCUTANEOUS - NO BREAST CANCER  . TONSILLECTOMY    . TOTAL ABDOMINAL HYSTERECTOMY W/ BILATERAL SALPINGOOPHORECTOMY    . TYMPANOPLASTY     Family History  Problem Relation Age of Onset  . ALS Mother   . Kidney disease Sister   . Arthritis Sister   . Aneurysm Brother   . Heart disease Daughter   . Colon cancer Unknown   . Breast cancer Neg Hx   . Bladder Cancer Neg Hx   . Prostate cancer Neg Hx    Social History   Tobacco Use  . Smoking status: Never Smoker  . Smokeless tobacco: Never Used  Substance Use Topics  . Alcohol use: No    Alcohol/week: 0.0 oz  . Drug use: No    Interim medical history since last visit reviewed. Allergies and medications reviewed  Review of Systems Per HPI unless specifically indicated above     Objective:    BP 130/70   Pulse 95   Temp 98.4 F (36.9 C) (Oral)   Resp 14   Ht 5\' 2"  (1.575 m)   Wt 158 lb 3.2 oz (71.8 kg)   SpO2 94%   BMI 28.94 kg/m   Physical Exam  Constitutional: She appears well-developed and well-nourished.  HENT:  Mouth/Throat: Mucous membranes are  normal.  Eyes: EOM are normal. No scleral icterus.  Cardiovascular: Normal rate and regular rhythm.  Pulmonary/Chest: Effort normal and breath sounds normal.  Abdominal: Soft. Bowel sounds are normal. She exhibits distension. There is generalized tenderness.  Psychiatric: She has a normal mood and affect. Her behavior is normal.   Diabetic Foot Form - Detailed   Diabetic Foot Exam - detailed Diabetic Foot exam was performed with the following findings:  Yes 01/02/2018  7:53 AM  Visual Foot Exam completed.:  Yes  Pulse Foot Exam completed.:  Yes  Right Dorsalis Pedis:  Present Left Dorsalis Pedis:  Present  Sensory Foot Exam Completed.:  Yes Semmes-Weinstein Monofilament Test R Site 1-Great Toe:  Pos L Site 1-Great Toe:  Pos           Assessment & Plan:   Problem List Items Addressed This Visit      Endocrine   DM (diabetes mellitus) type II controlled, neurological manifestation ([place]) - Primary   Relevant Orders   Hemoglobin A1C (Completed)   Urine Microalbumin w/creat. ratio (Completed)   Lipid panel (Completed)   COMPLETE METABOLIC PANEL WITH GFR (Completed)    Other Visit Diagnoses    Abdominal distension       sending to ER now   Right upper quadrant abdominal pain       sending to ER now       Follow up plan: No follow-ups on file.  An after-visit summary was printed and given to the patient at [facility].  Please see the patient instructions which may contain other information and recommendations beyond what is mentioned above in the assessment and plan.  No orders of the defined types were placed in this encounter.   Orders Placed This Encounter  Procedures  . Hemoglobin A1C  . Urine Microalbumin w/creat. ratio  . Lipid panel  . COMPLETE METABOLIC PANEL WITH GFR

## 2018-01-02 NOTE — Discharge Instructions (Signed)
Follow-up with Dr. Sanda Klein tomorrow at 4pm at her clinic. She will see you tomorrow for your follow-up today and important reevaluation of your pain, symptoms, and exam.  Please return to the emergency room right away if you are to develop a fever, severe nausea, your pain becomes severe or worsens, you are unable to keep food down, begin vomiting any dark or bloody fluid, you develop any dark or bloody stools, feel dehydrated, or other new concerns or symptoms arise.

## 2018-01-02 NOTE — ED Notes (Signed)
Patient transported to CT 

## 2018-01-03 ENCOUNTER — Encounter: Payer: Self-pay | Admitting: Family Medicine

## 2018-01-03 ENCOUNTER — Ambulatory Visit (INDEPENDENT_AMBULATORY_CARE_PROVIDER_SITE_OTHER): Payer: Medicare HMO | Admitting: Family Medicine

## 2018-01-03 VITALS — BP 118/74 | HR 94 | Temp 98.0°F | Resp 14 | Wt 157.8 lb

## 2018-01-03 DIAGNOSIS — N3000 Acute cystitis without hematuria: Secondary | ICD-10-CM | POA: Diagnosis not present

## 2018-01-03 DIAGNOSIS — Z780 Asymptomatic menopausal state: Secondary | ICD-10-CM

## 2018-01-03 DIAGNOSIS — E1140 Type 2 diabetes mellitus with diabetic neuropathy, unspecified: Secondary | ICD-10-CM | POA: Diagnosis not present

## 2018-01-03 DIAGNOSIS — R1084 Generalized abdominal pain: Secondary | ICD-10-CM

## 2018-01-03 NOTE — Progress Notes (Signed)
BP 118/74   Pulse 94   Temp 98 F (36.7 C) (Oral)   Resp 14   Wt 157 lb 12.8 oz (71.6 kg)   SpO2 93%   BMI 28.86 kg/m    Subjective:    Patient ID: [name], female    DOB: 07/16/1930, 84 y.o.   MRN: [id number]  HPI: [name] is a 84 y.o. female  Chief Complaint  Patient presents with  . Follow-up    ER for abdominal pain    HPI She is here for follow-up Went to the ER; reviewed labs and CT scan and ER provider note First dose of antibiotic waas last night in the ER She gets hot but it's 90 degrees in her house Feel like throwing up She had a BM today, but it gets stuck and has to use the glycerin suppositories and that gets it started; hemorrhoids are really bad Drinks some water, but will try to drink more  Two kidney stones on the right side; probable cyst on left kidney; cysts on liver and spleen Minimal atherosclerosis change of aorta Feeling well enough to go to [name], daughter says Passing gas  Depression screen [facility] 2/9 01/02/2018 06/17/2017 05/17/2017 04/30/2017 01/11/2017  Decreased Interest 0 0 0 0 0  Down, Depressed, Hopeless 1 0 0 0 1  PHQ - 2 Score 1 0 0 0 1  Altered sleeping 1 - - - -  Tired, decreased energy 2 - - - -  Change in appetite 1 - - - -  Feeling bad or failure about yourself  0 - - - -  Trouble concentrating 2 - - - -  Moving slowly or fidgety/restless - - - - -  Suicidal thoughts 0 - - - -  PHQ-9 Score 7 - - - -  Difficult doing work/chores Somewhat difficult - - - -  Some recent data might be hidden    Relevant past medical, surgical, family and social history reviewed Past Medical History:  Diagnosis Date  . Anxiety and depression   . Breast cancer ([place]) 2015   LT LUMPECTOMY  . Carpal tunnel syndrome   . Chest pain    SECONDARY TO GERD  . Chronic interstitial cystitis   . Chronic right hip pain   . Depression   . Diabetes mellitus without complication ([place])   . Diverticulosis   . Dyslipidemia   .  Essential hypertension, benign   . Fibromyalgia   . Frequent falls   . GERD (gastroesophageal reflux disease)   . Hearing loss of left ear   . History of fractured rib   . History of TIA (transient ischemic attack)   . Irritable bowel   . Lumbar arthropathy 10/04/2016   On CT scan, refer to ortho  . MS (multiple sclerosis) ([place])   . Osteoarthritis   . Paroxysmal supraventricular tachycardia ([place])   . Peripheral neuropathy    MILD  . Personal history of radiation therapy 2015   BREAST CA  . Protein malnutrition ([place])   . Pure hypercholesterolemia   . Recurrent UTI   . Skin cancer   . SOB (shortness of breath)    SECONDARY TO REFLUX  . Solitary pulmonary nodule on lung CT 12/04/2015   3 mm LLL lung nodule June 2016, March 2017  . Tachycardia, paroxysmal (HCC)    Reportedly Paroxysmal Supraventricular Tachycardia, but unable to find documentation confirming this  . Thickening of esophagus 01/11/2017   Noted on CT scan  June 2018  . Thyroid nodule   . Vertigo   . Vitamin B 12 deficiency    Past Surgical History:  Procedure Laterality Date  . APPENDECTOMY    . AUGMENTATION MAMMAPLASTY Right 1975   BREAST BREAST ONLY  . BLADDER TACKING     X 3  . BREAST LUMPECTOMY    . BREAST SURGERY    . CARDIAC CATHETERIZATION  10/2001   Normal coronary arteries with the exception of 20% proximal D1  . CHOLECYSTECTOMY    . COLONOSCOPY WITH PROPOFOL N/A 01/21/2015   Procedure: COLONOSCOPY WITH PROPOFOL;  Surgeon: Diane Class, MD;  Location: [facility] ENDOSCOPY;  Service: Endoscopy;  Laterality: N/A;  . ESOPHAGOGASTRODUODENOSCOPY N/A 01/21/2015   Procedure: ESOPHAGOGASTRODUODENOSCOPY (EGD);  Surgeon: Diane Class, MD;  Location[facility] ENDOSCOPY;  Service: Endoscopy;  Laterality: N/A;  . ESOPHAGOGASTRODUODENOSCOPY (EGD) WITH PROPOFOL N/A 04/01/2017   Procedure: ESOPHAGOGASTRODUODENOSCOPY (EGD) WITH PROPOFOL WITH BANDING;  Surgeon: Diane Bellows, MD;  Location: [facility] ENDOSCOPY;  Service:  Gastroenterology;  Laterality: N/A;  . EYE SURGERIES     WITH BUCKLE DETACHMENT OF THE RETINA  . FLEXIBLE SIGMOIDOSCOPY N/A 06/05/2017   Procedure: FLEXIBLE SIGMOIDOSCOPY WITHOUT SEDATION;  Surgeon: Diane Bellows, MD;  Location: [facility] ENDOSCOPY;  Service: Gastroenterology;  Laterality: N/A;  . heart monitor     Not being used... Patient can not have a MRI  . HEMORRHOID SURGERY     WITH RECONSTRUCTION  . MASTECTOMY Right 1970   SUBCUTANEOUS - NO BREAST CANCER  . TONSILLECTOMY    . TOTAL ABDOMINAL HYSTERECTOMY W/ BILATERAL SALPINGOOPHORECTOMY    . TYMPANOPLASTY     Family History  Problem Relation Age of Onset  . ALS Mother   . Kidney disease Sister   . Arthritis Sister   . Aneurysm Brother   . Heart disease Daughter   . Colon cancer Unknown   . Breast cancer Neg Hx   . Bladder Cancer Neg Hx   . Prostate cancer Neg Hx    Social History   Tobacco Use  . Smoking status: Never Smoker  . Smokeless tobacco: Never Used  Substance Use Topics  . Alcohol use: No    Alcohol/week: 0.0 oz  . Drug use: No    Interim medical history since last visit reviewed. Allergies and medications reviewed  Review of Systems Per HPI unless specifically indicated above     Objective:    BP 118/74   Pulse 94   Temp 98 F (36.7 C) (Oral)   Resp 14   Wt 157 lb 12.8 oz (71.6 kg)   SpO2 93%   BMI 28.86 kg/m   Wt Readings from Last 3 Encounters:  01/03/18 157 lb 12.8 oz (71.6 kg)  01/02/18 168 lb (76.2 kg)  01/02/18 158 lb 3.2 oz (71.8 kg)  MD note: the weight of 168 was likely NOT measured; I doubt its veracity  Physical Exam  Constitutional: She appears well-developed and well-nourished.  HENT:  Mouth/Throat: Mucous membranes are normal.  Eyes: EOM are normal. No scleral icterus.  Cardiovascular: Normal rate and regular rhythm.  Pulmonary/Chest: Effort normal and breath sounds normal.  Abdominal:  Abdominal exam is benign; no diffuse tenderness like yesterday; not as distended    Psychiatric: She has a normal mood and affect. Her behavior is normal.       Assessment & Plan:   Problem List Items Addressed This Visit    None    Visit Diagnoses    Generalized abdominal pain    -  Primary   s/p ER eval; ddx includes diverticulitis; continue antibiotics; reasons to return to ER reviewed; looked at ER labs and CT scan   Acute cystitis without hematuria       finish out antibiotics; hydrate   Postmenopausal       Relevant Orders   DG Bone Density       Follow up plan: No follow-ups on file.  An after-visit summary was printed and given to the patient at [facility].  Please see the patient instructions which may contain other information and recommendations beyond what is mentioned above in the assessment and plan.  No orders of the defined types were placed in this encounter.   Orders Placed This Encounter  Procedures  . DG Bone Density

## 2018-01-03 NOTE — Patient Instructions (Signed)
Continue the antibiotics Please do eat yogurt or kimchi or take a probiotic daily for the next month We want to replace the healthy germs in the gut If you notice foul, watery diarrhea in the next two months, schedule an appointment RIGHT AWAY or go to an urgent care or the emergency room if a holiday or over a weekend Do drink more water

## 2018-01-04 LAB — COMPLETE METABOLIC PANEL WITH GFR
AG RATIO: 1.8 (calc) (ref 1.0–2.5)
ALBUMIN MSPROF: 4.2 g/dL (ref 3.6–5.1)
ALKALINE PHOSPHATASE (APISO): 53 U/L (ref 33–130)
ALT: 22 U/L (ref 6–29)
AST: 8 U/L — ABNORMAL LOW (ref 10–35)
BILIRUBIN TOTAL: 0.4 mg/dL (ref 0.2–1.2)
BUN / CREAT RATIO: 20 (calc) (ref 6–22)
BUN: 23 mg/dL (ref 7–25)
CHLORIDE: 104 mmol/L (ref 98–110)
CO2: 29 mmol/L (ref 20–32)
Calcium: 9.6 mg/dL (ref 8.6–10.4)
Creat: 1.15 mg/dL — ABNORMAL HIGH (ref 0.60–0.88)
GFR, EST AFRICAN AMERICAN: 50 mL/min/{1.73_m2} — AB (ref >=60)
GFR, Est Non African American: 43 mL/min/{1.73_m2} — ABNORMAL LOW (ref >=60)
GLOBULIN: 2.3 g/dL (ref 1.9–3.7)
GLUCOSE: 248 mg/dL — AB (ref 65–139)
POTASSIUM: 4.8 mmol/L (ref 3.5–5.3)
SODIUM: 139 mmol/L (ref 135–146)
TOTAL PROTEIN: 6.5 g/dL (ref 6.1–8.1)

## 2018-01-04 LAB — HEMOGLOBIN A1C
EAG (MMOL/L): 10.8 (calc)
Hgb A1c MFr Bld: 8.4 % of total Hgb — ABNORMAL HIGH (ref <5.7)
Mean Plasma Glucose: 194 (calc)

## 2018-01-04 LAB — LIPID PANEL
Cholesterol: 213 mg/dL — ABNORMAL HIGH (ref <200)
HDL: 73 mg/dL (ref >50)
LDL CHOLESTEROL (CALC): 112 mg/dL — AB
Non-HDL Cholesterol (Calc): 140 mg/dL (calc) — ABNORMAL HIGH (ref <130)
TRIGLYCERIDES: 166 mg/dL — AB (ref <150)
Total CHOL/HDL Ratio: 2.9 (calc) (ref <5.0)

## 2018-01-04 LAB — MICROALBUMIN / CREATININE URINE RATIO
Creatinine, Urine: 176 mg/dL (ref 20–275)
MICROALB/CREAT RATIO: 10 ug/mg{creat} (ref <30)
Microalb, Ur: 1.8 mg/dL

## 2018-01-05 LAB — URINE CULTURE
Culture: >=100000 — AB
Special Requests: NORMAL

## 2018-01-08 ENCOUNTER — Other Ambulatory Visit: Payer: Self-pay | Admitting: Family Medicine

## 2018-01-08 MED ORDER — SITAGLIPTIN PHOSPHATE 50 MG PO TABS: 50.0000 mg | ORAL_TABLET | Freq: Every day | ORAL | 5 refills | Status: DC

## 2018-01-08 NOTE — Progress Notes (Signed)
januvia 50

## 2018-01-31 DIAGNOSIS — M48061 Spinal stenosis, lumbar region without neurogenic claudication: Secondary | ICD-10-CM | POA: Diagnosis not present

## 2018-02-11 ENCOUNTER — Ambulatory Visit (INDEPENDENT_AMBULATORY_CARE_PROVIDER_SITE_OTHER): Payer: Medicare HMO | Admitting: Family Medicine

## 2018-02-11 ENCOUNTER — Encounter: Payer: Self-pay | Admitting: Family Medicine

## 2018-02-11 VITALS — BP 130/70 | HR 90 | Temp 99.5°F | Resp 14 | Ht 62.0 in | Wt 160.5 lb

## 2018-02-11 DIAGNOSIS — R3129 Other microscopic hematuria: Secondary | ICD-10-CM

## 2018-02-11 DIAGNOSIS — N390 Urinary tract infection, site not specified: Secondary | ICD-10-CM | POA: Diagnosis not present

## 2018-02-11 DIAGNOSIS — N3001 Acute cystitis with hematuria: Secondary | ICD-10-CM

## 2018-02-11 LAB — POCT URINALYSIS DIPSTICK
Bilirubin, UA: NEGATIVE
GLUCOSE UA: NEGATIVE
KETONES UA: NEGATIVE
Nitrite, UA: NEGATIVE
PH UA: 5 (ref 5.0–8.0)
Protein, UA: POSITIVE — AB
Spec Grav, UA: 1.02 (ref 1.010–1.025)
UROBILINOGEN UA: 1 U/dL

## 2018-02-11 MED ORDER — AMOXICILLIN-POT CLAVULANATE 500-125 MG PO TABS: 1.0000 | ORAL_TABLET | Freq: Two times a day (BID) | ORAL | 0 refills | Status: AC

## 2018-02-11 NOTE — Patient Instructions (Signed)

## 2018-02-11 NOTE — Progress Notes (Signed)
Name: [name]   MRN: [id number]    DOB: 10-Jun-1930   Date:02/11/2018       Progress Note  Subjective  Chief Complaint  Chief Complaint  Patient presents with  . Urinary Tract Infection    HPI  Pt presents with concern for UTI. She notes chronic dysuria, and chronic low back pain. Back pain is treated by pain specialist.  Used to see a urologist, but it was expensive.  She has a history of kidney stones in the past.  PT does have history recurrent/chronic UTI and hematuria.  Daughter accompanies patient and is concerned for recurrent UTI due to patient complaining of increased dysuria.  Pt also has low grade temp in office today of 99.30F.  She has not had any episodes of change in cognition/worsening confusion.  Patient Active Problem List   Diagnosis Date Noted  . DNAR (do not attempt resuscitation) 04/30/2017  . Preventative health care 04/30/2017  . Thickening of esophagus 01/11/2017  . Lumbar arthropathy 10/04/2016  . Vertigo, aural, left 07/31/2016  . Rectal pain 06/25/2016  . External hemorrhoid 06/18/2016  . Head injury due to trauma 05/09/2016  . Neoplasm of uncertain behavior of skin 05/09/2016  . Hemorrhoids 04/24/2016  . Dementia 04/24/2016  . Medication monitoring encounter 04/03/2016  . Gait abnormality 04/03/2016  . Skin lump of leg 04/03/2016  . Pain in right knee 02/15/2016  . Pain and swelling of right knee 02/13/2016  . Right knee sprain 02/13/2016  . Solitary pulmonary nodule on lung CT 12/04/2015  . Generalized degenerative joint disease of hand 11/21/2015  . Triggering of digit 11/21/2015  . Snapping thumb syndrome 11/21/2015  . Pain, joint, hand 11/11/2015  . Coronary artery calcification seen on CAT scan 11/11/2015  . Multiple sclerosis ([place]) 11/11/2015  . Chronic pain of multiple joints 09/02/2015  . Right lower quadrant abdominal pain 08/02/2015  . Microscopic hematuria 07/12/2015  . Recurrent UTI 04/09/2015  . Carpal tunnel syndrome  01/12/2014  . DD (diverticular disease) 01/12/2014  . Ductal carcinoma in situ of left breast 01/12/2014  . Hypercholesteremia 01/12/2014  . DS (disseminated sclerosis) ([place]) 01/12/2014  . Arthritis, degenerative 01/12/2014  . Thyroid nodule 01/12/2014  . Cyanocobalamine deficiency (non anemic) 01/12/2014  . Anxiety and depression 01/12/2014  . Intraductal carcinoma of breast 01/12/2014  . History of right mastectomy 01/12/2014  . DM (diabetes mellitus) type II controlled, neurological manifestation ([place]) 06/26/2010  . Depression, major, recurrent, in partial remission ([place]) 06/26/2010  . Frequent falls 06/26/2010  . Stricture and stenosis of esophagus 06/26/2010  . Gastro-esophageal reflux disease without esophagitis 06/26/2010  . Fibromyalgia 06/26/2010  . Chronic interstitial cystitis 06/26/2010  . H/O malignant neoplasm of breast 06/26/2010  . Hypertension goal BP (blood pressure) < 140/90 06/01/2009  . H/O paroxysmal supraventricular tachycardia 06/01/2009  . Paroxysmal supraventricular tachycardia ([place]) 06/01/2009  . Irritable bowel syndrome with constipation and diarrhea 06/16/2008  . Diarrhea, functional 06/16/2008  . Diarrhea, unspecified 06/16/2008    Social History   Tobacco Use  . Smoking status: Never Smoker  . Smokeless tobacco: Never Used  Substance Use Topics  . Alcohol use: No    Alcohol/week: 0.0 oz     Current Outpatient Medications:  .  acetaminophen (TYLENOL) 500 MG tablet, Take 500 mg by mouth every 8 (eight) hours as needed. , Disp: , Rfl:  .  amLODipine (NORVASC) 2.5 MG tablet, TAKE 1 TABLET (2.5 MG TOTAL) BY MOUTH DAILY., Disp: 30 tablet, Rfl: 11 .  aspirin  81 MG tablet, Take 81 mg by mouth daily. , Disp: , Rfl:  .  atorvastatin (LIPITOR) 40 MG tablet, TAKE 1 TABLET BY MOUTH EVERYDAY AT BEDTIME, Disp: 90 tablet, Rfl: 0 .  b complex vitamins tablet, Take 1 tablet by mouth daily., Disp: , Rfl:  .  Carboxymethylcell-Hypromellose (GENTEAL OP), Apply 1  drop to eye. Each eye as needed, Disp: , Rfl:  .  cetirizine (ZYRTEC) 10 MG tablet, Take 10 mg daily by mouth. , Disp: , Rfl:  .  Cholecalciferol (VITAMIN D-1000 MAX ST) 1000 units tablet, Take 5,000 Units once a week by mouth. , Disp: , Rfl:  .  dicyclomine (BENTYL) 10 MG capsule, Take 10 mg as needed by mouth. , Disp: , Rfl:  .  DULoxetine (CYMBALTA) 20 MG capsule, Take 20 mg by mouth daily., Disp: , Rfl: 0 .  DULoxetine (CYMBALTA) 30 MG capsule, Take 30 mg by mouth daily., Disp: , Rfl:  .  ferrous sulfate 325 (65 FE) MG tablet, Take 325 mg daily by mouth. , Disp: , Rfl:  .  fluticasone (FLONASE) 50 MCG/ACT nasal spray, SPRAY 2 SPRAYS EACH INTO BOTH NOSTRILS ONCE prn, Disp: , Rfl: 5 .  Incontinence Supply Disposable (DEPEND UNDERWEAR LARGE) MISC, , Disp: , Rfl:  .  LACTOBACILLUS PO, Take daily by mouth. , Disp: , Rfl:  .  loratadine (CLARITIN) 10 MG tablet, Take 10 mg by mouth daily., Disp: , Rfl:  .  Loratadine 10 MG CAPS, Take 10 mg by mouth daily. , Disp: , Rfl:  .  meclizine (ANTIVERT) 25 MG tablet, TAKE 1 TABLET (25 MG TOTAL) BY MOUTH EVERY 8 (EIGHT) HOURS AS NEEDED FOR DIZZINESS., Disp: 30 tablet, Rfl: 1 .  memantine (NAMENDA) 10 MG tablet, Take 10 mg by mouth 2 (two) times daily. , Disp: , Rfl:  .  methylPREDNISolone (MEDROL DOSEPAK) 4 MG TBPK tablet, TAKE 6 TABLETS ON DAY 1 AS DIRECTED ON PACKAGE AND DECREASE BY 1 TAB EACH DAY FOR A TOTAL OF 6 DAYS, Disp: , Rfl: 0 .  neomycin-polymyxin b-dexamethasone (MAXITROL) 3.5-10000-0.1 OINT, PLACE SMALL RIBBON IN BOTH EYES 2 TIMES A DAY FOR 14 DAYS, Disp: , Rfl: 0 .  omeprazole (PRILOSEC) 20 MG capsule, TAKE 1 CAPSULE BY MOUTH EVERY DAY, Disp: 90 capsule, Rfl: 1 .  ondansetron (ZOFRAN) 4 MG tablet, Take 1 tablet (4 mg total) by mouth daily as needed for nausea or vomiting., Disp: 30 tablet, Rfl: 0 .  oxyCODONE-acetaminophen (PERCOCET) 5-325 MG tablet, Take 0.5 tablets by mouth every 4 (four) hours as needed for moderate pain or severe pain., Disp:  2 tablet, Rfl: 0 .  polyethylene glycol powder (GLYCOLAX/MIRALAX) powder, 17 grams daily, Disp: 255 g, Rfl: 3 .  Probiotic Product (PROBIOTIC COLON SUPPORT) CAPS, Take 1 capsule by mouth daily. , Disp: , Rfl:  .  sertraline (ZOLOFT) 50 MG tablet, TAKE 1 TABLET (50 MG TOTAL) BY MOUTH DAILY., Disp: 90 tablet, Rfl: 2 .  sitaGLIPtin (JANUVIA) 50 MG tablet, Take 1 tablet (50 mg total) by mouth daily., Disp: 30 tablet, Rfl: 5 .  tiZANidine (ZANAFLEX) 2 MG tablet, Take 2 mg every 6 (six) hours by mouth., Disp: , Rfl: 1 .  amoxicillin-clavulanate (AUGMENTIN) 875-125 MG tablet, Take 1 tablet by mouth 2 (two) times daily. (Patient not taking: Reported on 02/11/2018), Disp: 20 tablet, Rfl: 0 .  Cyanocobalamin (VITAMIN B-12) 1000 MCG SUBL, daily. , Disp: , Rfl:   Allergies  Allergen Reactions  . Aricept [Donepezil Hcl] Other (See Comments)  hallucinations  . Biaxin [Clarithromycin] Other (See Comments) and Diarrhea  . Copaxone [Glatiramer Acetate]   . Decadron [Dexamethasone] Nausea And Vomiting and Other (See Comments)    Other reaction(s): Muscle Pain Reaction:  Abdominal pain  . Dexamethasone Sodium Phosphate Other (See Comments)  . Diltiazem Hcl Other (See Comments)    Reaction:  Unknown   . Diltiazem Hcl Other (See Comments)  . Flagyl [Metronidazole] Nausea Only and Diarrhea    Other reaction(s): Vomiting  . Gabapentin Other (See Comments)    "burning all over" Reaction:  Unknown   . Iohexol Swelling and Other (See Comments)     Desc: tongue swelling with "IVP dye" sccording to nurses notes with a lumbar myelo-12/09- asm, Onset Date: [id number]  Pts tongue swells.  Clementeen Hoof [Iodinated Diagnostic Agents] Swelling and Other (See Comments)    Passed out  Pts tongue swells.   . Levothyroxine Nausea Only  . Macrobid [Nitrofurantoin Macrocrystal] Nausea And Vomiting  . Sulfa Antibiotics Other (See Comments)    Reaction:  Unknown  Reaction:  Unknown   . Sulfonamide Derivatives Other (See  Comments)    Reaction:  Unknown   . Synthroid [Levothyroxine Sodium]   . Copaxone  [Glatiramer] Rash  . Interferon Beta-1a Other (See Comments) and Rash    Reaction:  Unknown     ROS  Ten systems reviewed and is negative except as mentioned in HPI.  Objective  Vitals:   02/11/18 1103  BP: 130/70  Pulse: 90  Resp: 14  Temp: 99.5 F (37.5 C)  TempSrc: Oral  SpO2: 94%  Weight: 160 lb 8 oz (72.8 kg)  Height: 5' 2"  (1.575 m)   Body mass index is 29.36 kg/m.  Nursing Note and Vital Signs reviewed.  Physical Exam  Constitutional: Patient appears well-developed and well-nourished. Obese. No distress.  HEENT: head atraumatic, normocephalic Cardiovascular: Normal rate, regular rhythm, S1/S2 present.  No murmur or rub heard. No BLE edema. Pulmonary/Chest: Effort normal and breath sounds clear. No respiratory distress or retractions. Abdominal: Soft and moderate bilateral lower abdominal tenderness is present on examiantion, bowel sounds present x4 quadrants.  No CVA Tenderness on percussion. Psychiatric: Patient has a normal mood and affect. behavior is normal. Judgment and thought content normal. Neuro: Alert and at cognitive baseline.  Results for orders placed or performed in visit on 02/11/18 (from the past 72 hour(s))  POCT urinalysis dipstick     Status: Abnormal   Collection Time: 02/11/18 11:09 AM  Result Value Ref Range   Color, UA gold    Clarity, UA clear    Glucose, UA Negative Negative   Bilirubin, UA negative    Ketones, UA negative    Spec Grav, UA 1.020 1.010 - 1.025   Blood, UA large    pH, UA 5.0 5.0 - 8.0   Protein, UA Positive (A) Negative   Urobilinogen, UA 1.0 0.2 or 1.0 E.U./dL   Nitrite, UA negative    Leukocytes, UA Large (3+) (A) Negative   Appearance clear    Odor none     Assessment & Plan  1. Acute cystitis with hematuria - POCT urinalysis dipstick - Urine Culture - amoxicillin-clavulanate (AUGMENTIN) 500-125 MG tablet; Take 1 tablet  (500 mg total) by mouth 2 (two) times daily for 7 days.  Dispense: 14 tablet; Refill: 0  2. Microscopic hematuria - Pt has history hematuria, declines referral back to urology at this time.  3. Recurrent UTI - amoxicillin-clavulanate (AUGMENTIN) 500-125 MG tablet; Take 1 tablet (  500 mg total) by mouth 2 (two) times daily for 7 days.  Dispense: 14 tablet; Refill: 0  - Creatinine Clearance is 40 based on Creatinine Level taken on 01/03/2018 - we will avoid Macrobid; allergic to sulfa medications, and she is elderly so we will avoid fluoroquinolones. Encouraged adequate hydration, and return precautions as below.  -Red flags and when to present for emergency care or RTC including fever >101.46F, chest pain, worsening of back pain, nausea or vomiting, shortness of breath, confusion/change in mental status, new/worsening/un-resolving symptoms, reviewed with patient at time of visit. Follow up and care instructions discussed and provided in AVS.

## 2018-02-12 ENCOUNTER — Telehealth: Payer: Self-pay | Admitting: Family Medicine

## 2018-02-12 NOTE — Telephone Encounter (Signed)
Copied from [place] [id number]. Topic: Quick Communication - See Telephone Encounter >> Feb 12, 2018  1:58 PM Bea Graff, NT wrote: CRM for notification. See Telephone encounter for: 02/12/18. Pts daughter calling to see if the sitaGLIPtin (JANUVIA) 50 MG tablet can be changed to another medication due to cost. CB#: [phone]

## 2018-02-13 MED ORDER — PIOGLITAZONE HCL 15 MG PO TABS: 15.0000 mg | ORAL_TABLET | Freq: Every day | ORAL | 5 refills | Status: DC

## 2018-02-13 NOTE — Telephone Encounter (Signed)
Okay Stop Januvia Start Actos; if any problems with swelling, SHOB, heart failure symptoms, stop and seek medical attention right away

## 2018-02-13 NOTE — Telephone Encounter (Signed)
Left detailed voicemail

## 2018-02-14 LAB — URINE CULTURE
MICRO NUMBER: 90813402
SPECIMEN QUALITY: ADEQUATE

## 2018-02-19 DIAGNOSIS — M5417 Radiculopathy, lumbosacral region: Secondary | ICD-10-CM | POA: Diagnosis not present

## 2018-02-20 ENCOUNTER — Ambulatory Visit (INDEPENDENT_AMBULATORY_CARE_PROVIDER_SITE_OTHER): Payer: Medicare HMO | Admitting: Family Medicine

## 2018-02-20 ENCOUNTER — Encounter: Payer: Self-pay | Admitting: Family Medicine

## 2018-02-20 VITALS — BP 118/72 | HR 93 | Temp 98.3°F | Resp 14 | Ht 62.0 in | Wt 158.0 lb

## 2018-02-20 DIAGNOSIS — R350 Frequency of micturition: Secondary | ICD-10-CM

## 2018-02-20 LAB — POCT URINALYSIS DIPSTICK
Bilirubin, UA: NEGATIVE
Blood, UA: NEGATIVE
Glucose, UA: NEGATIVE
Ketones, UA: NEGATIVE
LEUKOCYTES UA: NEGATIVE
NITRITE UA: NEGATIVE
ODOR: NORMAL
PH UA: 5.5 (ref 5.0–8.0)
PROTEIN UA: NEGATIVE
Spec Grav, UA: 1.025 (ref 1.010–1.025)
UROBILINOGEN UA: 0.2 U/dL

## 2018-02-20 NOTE — Progress Notes (Signed)
BP 118/72   Pulse 93   Temp 98.3 F (36.8 C) (Oral)   Resp 14   Ht 5\' 2"  (1.575 m)   Wt 158 lb (71.7 kg)   SpO2 95%   BMI 28.90 kg/m    Subjective:    Patient ID: [name], female    DOB: [birth year], 84 y.o.   MRN: [id number]  HPI: [name] is a 84 y.o. female  Chief Complaint  Patient presents with  . Urinary Tract Infection    wet hereself alot last night (but had back injection yesterday)    HPI She is here with her daughter She had her back injected yesterday Patient thinks she has a bladder infection No blood in her urine today, pinkish earlier this week Both sides of the back hurting No fevers, but her head gets really hot Burning with urination She forgets to flush; no odor noted by patient Has cranberry pills, two bottles, but not taking Some arthritis in the left foot >> right foot; not wearing supportive sneakers  Depression screen [facility] 2/9 02/11/2018 01/02/2018 06/17/2017 05/17/2017 04/30/2017  Decreased Interest 0 0 0 0 0  Down, Depressed, Hopeless 1 1 0 0 0  PHQ - 2 Score 1 1 0 0 0  Altered sleeping 1 1 - - -  Tired, decreased energy 2 2 - - -  Change in appetite 1 1 - - -  Feeling bad or failure about yourself  0 0 - - -  Trouble concentrating 0 2 - - -  Moving slowly or fidgety/restless 2 - - - -  Suicidal thoughts 0 0 - - -  PHQ-9 Score 7 7 - - -  Difficult doing work/chores Not difficult at all Somewhat difficult - - -  Some recent data might be hidden    Relevant past medical, surgical, family and social history reviewed Past Medical History:  Diagnosis Date  . Anxiety and depression   . Breast cancer ([place]) 2015   LT LUMPECTOMY  . Carpal tunnel syndrome   . Chest pain    SECONDARY TO GERD  . Chronic interstitial cystitis   . Chronic right hip pain   . Depression   . Diabetes mellitus without complication ([place])   . Diverticulosis   . Dyslipidemia   . Essential hypertension, benign   . Fibromyalgia   . Frequent falls   .  GERD (gastroesophageal reflux disease)   . Hearing loss of left ear   . History of fractured rib   . History of TIA (transient ischemic attack)   . Irritable bowel   . Lumbar arthropathy 10/04/2016   On CT scan, refer to ortho  . MS (multiple sclerosis) ([place])   . Osteoarthritis   . Paroxysmal supraventricular tachycardia ([place])   . Peripheral neuropathy    MILD  . Personal history of radiation therapy 2015   BREAST CA  . Protein malnutrition ([place])   . Pure hypercholesterolemia   . Recurrent UTI   . Skin cancer   . SOB (shortness of breath)    SECONDARY TO REFLUX  . Solitary pulmonary nodule on lung CT 12/04/2015   3 mm LLL lung nodule June 2016, March 2017  . Tachycardia, paroxysmal ([facility])    Reportedly Paroxysmal Supraventricular Tachycardia, but unable to find documentation confirming this  . Thickening of esophagus 01/11/2017   Noted on CT scan June 2018  . Thyroid nodule   . Vertigo   . Vitamin B 12 deficiency  Past Surgical History:  Procedure Laterality Date  . APPENDECTOMY    . AUGMENTATION MAMMAPLASTY Right 1975   BREAST BREAST ONLY  . BLADDER TACKING     X 3  . BREAST LUMPECTOMY    . BREAST SURGERY    . CARDIAC CATHETERIZATION  10/2001   Normal coronary arteries with the exception of 20% proximal D1  . CHOLECYSTECTOMY    . COLONOSCOPY WITH PROPOFOL N/A 01/21/2015   Procedure: COLONOSCOPY WITH PROPOFOL;  Surgeon: Josefine Class, MD;  [facility] ENDOSCOPY;  Service: Endoscopy;  Laterality: N/A;  . ESOPHAGOGASTRODUODENOSCOPY N/A 01/21/2015   Procedure: ESOPHAGOGASTRODUODENOSCOPY (EGD);  Surgeon: Josefine Class, MD;  Location: [facility] ENDOSCOPY;  Service: Endoscopy;  Laterality: N/A;  . ESOPHAGOGASTRODUODENOSCOPY (EGD) WITH PROPOFOL N/A 04/01/2017   Procedure: ESOPHAGOGASTRODUODENOSCOPY (EGD) WITH PROPOFOL WITH BANDING;  Surgeon: Jonathon Bellows, MD;  Location[facility] ENDOSCOPY;  Service: Gastroenterology;  Laterality: N/A;  . EYE SURGERIES     WITH BUCKLE DETACHMENT  OF THE RETINA  . FLEXIBLE SIGMOIDOSCOPY N/A 06/05/2017   Procedure: FLEXIBLE SIGMOIDOSCOPY WITHOUT SEDATION;  Surgeon: Jonathon Bellows, MD;  Location: [facility] ENDOSCOPY;  Service: Gastroenterology;  Laterality: N/A;  . heart monitor     Not being used... Patient can not have a MRI  . HEMORRHOID SURGERY     WITH RECONSTRUCTION  . MASTECTOMY Right 1970   SUBCUTANEOUS - NO BREAST CANCER  . TONSILLECTOMY    . TOTAL ABDOMINAL HYSTERECTOMY W/ BILATERAL SALPINGOOPHORECTOMY    . TYMPANOPLASTY     Family History  Problem Relation Age of Onset  . ALS Mother   . Kidney disease Sister   . Arthritis Sister   . Aneurysm Brother   . Heart disease Daughter   . Colon cancer Unknown   . Breast cancer Neg Hx   . Bladder Cancer Neg Hx   . Prostate cancer Neg Hx    Social History   Tobacco Use  . Smoking status: Never Smoker  . Smokeless tobacco: Never Used  Substance Use Topics  . Alcohol use: No    Alcohol/week: 0.0 oz  . Drug use: No    Interim medical history since last visit reviewed. Allergies and medications reviewed  Review of Systems Per HPI unless specifically indicated above     Objective:    BP 118/72   Pulse 93   Temp 98.3 F (36.8 C) (Oral)   Resp 14   Ht 5\' 2"  (1.575 m)   Wt 158 lb (71.7 kg)   SpO2 95%   BMI 28.90 kg/m   Wt Readings from Last 3 Encounters:  02/20/18 158 lb (71.7 kg)  02/11/18 160 lb 8 oz (72.8 kg)  01/03/18 157 lb 12.8 oz (71.6 kg)    Physical Exam  Constitutional: She appears well-developed and well-nourished.  HENT:  Mouth/Throat: Mucous membranes are normal.  Eyes: EOM are normal. No scleral icterus.  Cardiovascular: Normal rate and regular rhythm.  Pulmonary/Chest: Effort normal and breath sounds normal.  Abdominal: She exhibits no distension. There is no tenderness.  Psychiatric: She has a normal mood and affect. Her behavior is normal.      Assessment & Plan:   Problem List Items Addressed This Visit    None    Visit Diagnoses     Urine frequency    -  Primary   reviewed urine findings; no sign of infection; not sure if related to recent injection; start back on cranberry pills for UTI prophylaxis   Relevant Orders   POCT urinalysis dipstick (  Completed)       Follow up plan: No follow-ups on file.  An after-visit summary was printed and given to the patient at [place].  Please see the patient instructions which may contain other information and recommendations beyond what is mentioned above in the assessment and plan.  No orders of the defined types were placed in this encounter.   Orders Placed This Encounter  Procedures  . POCT urinalysis dipstick

## 2018-02-20 NOTE — Patient Instructions (Signed)
Take the cranberry pills, one every single day Drink enough water to keep your urine pale yellow Call us with any issues

## 2018-02-22 ENCOUNTER — Other Ambulatory Visit: Payer: Self-pay | Admitting: Family Medicine

## 2018-02-22 NOTE — Telephone Encounter (Signed)
Last sgpt reviewed; Rx approved 

## 2018-03-13 DIAGNOSIS — M25511 Pain in right shoulder: Secondary | ICD-10-CM | POA: Diagnosis not present

## 2018-03-18 ENCOUNTER — Telehealth: Payer: Self-pay

## 2018-03-18 NOTE — Telephone Encounter (Signed)
Called pt to sched AWV w/ [facility]. LVM requesting returned call.  

## 2018-04-14 ENCOUNTER — Ambulatory Visit: Payer: Medicare HMO | Admitting: Family Medicine

## 2018-04-17 ENCOUNTER — Ambulatory Visit (INDEPENDENT_AMBULATORY_CARE_PROVIDER_SITE_OTHER): Payer: Medicare HMO

## 2018-04-17 VITALS — BP 130/62 | HR 73 | Temp 98.7°F | Resp 12 | Ht 62.0 in | Wt 159.9 lb

## 2018-04-17 DIAGNOSIS — Z0000 Encounter for general adult medical examination without abnormal findings: Secondary | ICD-10-CM | POA: Diagnosis not present

## 2018-04-17 DIAGNOSIS — Z23 Encounter for immunization: Secondary | ICD-10-CM

## 2018-04-17 DIAGNOSIS — E1140 Type 2 diabetes mellitus with diabetic neuropathy, unspecified: Secondary | ICD-10-CM

## 2018-04-17 NOTE — Progress Notes (Signed)
Subjective:   [name] is a 83 y.o. female who presents for Medicare Annual (Subsequent) preventive examination.  Review of Systems:   N/A Cardiac Risk Factors include: advanced age (>70men, >74 women);dyslipidemia;diabetes mellitus;hypertension;sedentary lifestyle     Objective:     Vitals: BP 130/62 (BP Location: Right Arm, Patient Position: Sitting, Cuff Size: Normal)   Pulse 73   Temp 98.7 F (37.1 C) (Oral)   Resp 12   Ht 5\' 2"  (1.575 m)   Wt 159 lb 14.4 oz (72.5 kg)   SpO2 97%   BMI 29.25 kg/m   Body mass index is 29.25 kg/m.  Advanced Directives 04/17/2018 06/12/2017 06/05/2017 05/17/2017 04/30/2017 04/30/2017 04/30/2017  Does Patient Have a Medical Advance Directive? Yes Yes Yes Yes - Yes Yes  Type of Advance Directive [place];Living will [place];Living will Healthcare Power of [place] - -  Does patient want to make changes to medical advance directive? - - - - No - Patient declined - -  Copy of [place] in Chart? No - copy requested No - copy requested No - copy requested - - - -  Would patient like information on creating a medical advance directive? - - - - - - -  Pre-existing out of facility DNR order (yellow form or pink MOST form) - - Yellow form placed in chart (order not valid for inpatient use) - - - -    Tobacco Social History   Tobacco Use  Smoking Status Never Smoker  Smokeless Tobacco Never Used  Tobacco Comment   smoking cessation materials not required     Counseling given: No Comment: smoking cessation materials not required  Clinical Intake:  Pre-visit preparation completed: Yes  Pain : No/denies pain   BMI - recorded: 29.25 Nutritional Status: BMI 25 -29 Overweight Nutritional Risks: None  Nutrition Risk Assessment: Has the patient had any N/V/D within the last 2 months?  No Does the patient have any  non-healing wounds?  No Has the patient had any unintentional weight loss or weight gain?  No  Is the patient diabetic?  Yes If diabetic, was a CBG obtained today?  No Did the patient bring in their glucometer from home?  No Comments: Pt does not monitor CBG's. Denies any financial strains with the device or supplies.  Diabetic Exams: Diabetic Eye Exam: Completed 03/06/17. Overdue for diabetic eye exam. Pt has been advised about the importance in completing this exam. A referral has been placed today. Message sent to referral coordinator for scheduling purposes. Advised pt to expect a call from our office re: appt. Diabetic Foot Exam: Completed 01/02/18.   How often do you need to have someone help you when you read instructions, pamphlets, or other written materials from your doctor or pharmacy?: 1 - Never  Interpreter Needed?: No  Information entered by :: Idell Pickles, LPN  Past Medical History:  Diagnosis Date  . Anxiety and depression   . Breast cancer ([place]) 2015   LT LUMPECTOMY  . Carpal tunnel syndrome   . Chest pain    SECONDARY TO GERD  . Chronic interstitial cystitis   . Chronic right hip pain   . Depression   . Diabetes mellitus without complication ([place])   . Diverticulosis   . Dyslipidemia   . Essential hypertension, benign   . Fibromyalgia   . Frequent falls   . GERD (gastroesophageal reflux disease)   .  Hearing loss of left ear   . History of fractured rib   . History of TIA (transient ischemic attack)   . Irritable bowel   . Lumbar arthropathy 10/04/2016   On CT scan, refer to ortho  . MS (multiple sclerosis) ([place])   . Osteoarthritis   . Paroxysmal supraventricular tachycardia ([place])   . Peripheral neuropathy    MILD  . Personal history of radiation therapy 2015   BREAST CA  . Protein malnutrition ([place])   . Pure hypercholesterolemia   . Recurrent UTI   . Skin cancer   . SOB (shortness of breath)    SECONDARY TO REFLUX  . Solitary pulmonary nodule on lung  CT 12/04/2015   3 mm LLL lung nodule June 2016, March 2017  . Tachycardia, paroxysmal ([facility])    Reportedly Paroxysmal Supraventricular Tachycardia, but unable to find documentation confirming this  . Thickening of esophagus 01/11/2017   Noted on CT scan June 2018  . Thyroid nodule   . Vertigo   . Vitamin B 12 deficiency    Past Surgical History:  Procedure Laterality Date  . APPENDECTOMY    . AUGMENTATION MAMMAPLASTY Right 1975   BREAST BREAST ONLY  . BLADDER TACKING     X 3  . BREAST LUMPECTOMY    . BREAST SURGERY    . CARDIAC CATHETERIZATION  10/2001   Normal coronary arteries with the exception of 20% proximal D1  . CHOLECYSTECTOMY    . COLONOSCOPY WITH PROPOFOL N/A 01/21/2015   Procedure: COLONOSCOPY WITH PROPOFOL;  Surgeon: Josefine Class, MD;  Location: [facility] ENDOSCOPY;  Service: Endoscopy;  Laterality: N/A;  . ESOPHAGOGASTRODUODENOSCOPY N/A 01/21/2015   Procedure: ESOPHAGOGASTRODUODENOSCOPY (EGD);  Surgeon: Josefine Class, MD;  [facility]COPY;  Service: Endoscopy;  Laterality: N/A;  . ESOPHAGOGASTRODUODENOSCOPY (EGD) WITH PROPOFOL N/A 04/01/2017   Procedure: ESOPHAGOGASTRODUODENOSCOPY (EGD) WITH PROPOFOL WITH BANDING;  Surgeon: Jonathon Bellows, MD;  Location: [facility] ENDOSCOPY;  Service: Gastroenterology;  Laterality: N/A;  . EYE SURGERIES     WITH BUCKLE DETACHMENT OF THE RETINA  . FLEXIBLE SIGMOIDOSCOPY N/A 06/05/2017   Procedure: FLEXIBLE SIGMOIDOSCOPY WITHOUT SEDATION;  Surgeon: Jonathon Bellows, MD;  Location: [facility] ENDOSCOPY;  Service: Gastroenterology;  Laterality: N/A;  . heart monitor     Not being used... Patient can not have a MRI  . HEMORRHOID SURGERY     WITH RECONSTRUCTION  . MASTECTOMY Right 1970   SUBCUTANEOUS - NO BREAST CANCER  . TONSILLECTOMY    . TOTAL ABDOMINAL HYSTERECTOMY W/ BILATERAL SALPINGOOPHORECTOMY    . TYMPANOPLASTY     Family History  Problem Relation Age of Onset  . ALS Mother   . Cancer Father        lung  . Aneurysm Brother   .  Heart disease Daughter   . Colon cancer Unknown   . Breast cancer Neg Hx   . Bladder Cancer Neg Hx   . Prostate cancer Neg Hx    Social History   Socioeconomic History  . Marital status: Married    Spouse name: Marcello Moores  . Number of children: 4  . Years of education: Not on file  . Highest education level: 12th grade  Occupational History  . Occupation: RETIRED    Employer: RETIRED    Comment: [employer]  Social Needs  . Financial resource strain: Not hard at all  . Food insecurity:    Worry: Never true    Inability: Never true  . Transportation needs:  Medical: No    Non-medical: No  Tobacco Use  . Smoking status: Never Smoker  . Smokeless tobacco: Never Used  . Tobacco comment: smoking cessation materials not required  Substance and Sexual Activity  . Alcohol use: No    Alcohol/week: 0.0 standard drinks  . Drug use: No  . Sexual activity: Never  Lifestyle  . Physical activity:    Days per week: 0 days    Minutes per session: 0 min  . Stress: Not at all  Relationships  . Social connections:    Talks on phone: Patient refused    Gets together: Patient refused    Attends religious service: Patient refused    Active member of club or organization: Patient refused    Attends meetings of clubs or organizations: Patient refused    Relationship status: Married  Other Topics Concern  . Not on file  Social History Narrative   1 SON, 3 DAUGHTER'S   15 GRANDCHILDREN   LIVING AT [facility]    Outpatient Encounter Medications as of 04/17/2018  Medication Sig  . acetaminophen (TYLENOL) 500 MG tablet Take 500 mg by mouth every 8 (eight) hours as needed.   Marland Kitchen aspirin 81 MG tablet Take 81 mg by mouth daily.   Marland Kitchen atenolol (TENORMIN) 25 MG tablet Take by mouth daily.  Marland Kitchen atorvastatin (LIPITOR) 40 MG tablet TAKE 1 TABLET BY MOUTH EVERYDAY AT BEDTIME  . b complex vitamins tablet Take 1 tablet by mouth daily.  . Carboxymethylcell-Hypromellose (GENTEAL OP)  Apply 1 drop to eye. Each eye as needed  . cetirizine (ZYRTEC) 10 MG tablet Take 10 mg daily by mouth.   . Cholecalciferol (VITAMIN D-1000 MAX ST) 1000 units tablet Take 5,000 Units once a week by mouth.   . Cyanocobalamin (VITAMIN B-12) 1000 MCG SUBL daily.   Marland Kitchen dicyclomine (BENTYL) 10 MG capsule Take 10 mg as needed by mouth.   . DULoxetine (CYMBALTA) 30 MG capsule Take 30 mg by mouth daily.  . ferrous sulfate 325 (65 FE) MG tablet Take 325 mg daily by mouth.   . fluticasone (FLONASE) 50 MCG/ACT nasal spray SPRAY 2 SPRAYS EACH INTO BOTH NOSTRILS ONCE prn  . Incontinence Supply Disposable (DEPEND UNDERWEAR LARGE) MISC   . LACTOBACILLUS PO Take daily by mouth.   . loratadine (CLARITIN) 10 MG tablet Take 10 mg by mouth daily.  . meclizine (ANTIVERT) 25 MG tablet TAKE 1 TABLET (25 MG TOTAL) BY MOUTH EVERY 8 (EIGHT) HOURS AS NEEDED FOR DIZZINESS.  . memantine (NAMENDA) 10 MG tablet Take 10 mg by mouth 2 (two) times daily.   Marland Kitchen omeprazole (PRILOSEC) 20 MG capsule TAKE 1 CAPSULE BY MOUTH EVERY DAY  . pioglitazone (ACTOS) 15 MG tablet Take 1 tablet (15 mg total) by mouth daily. For diabetes  . polyethylene glycol powder (GLYCOLAX/MIRALAX) powder 17 grams daily  . Probiotic Product (PROBIOTIC COLON SUPPORT) CAPS Take 1 capsule by mouth daily.   Marland Kitchen tiZANidine (ZANAFLEX) 2 MG tablet Take 2 mg every 6 (six) hours by mouth.  Marland Kitchen amLODipine (NORVASC) 2.5 MG tablet TAKE 1 TABLET (2.5 MG TOTAL) BY MOUTH DAILY. (Patient not taking: Reported on 04/17/2018)  . DULoxetine (CYMBALTA) 20 MG capsule Take 20 mg by mouth daily.  . Loratadine 10 MG CAPS Take 10 mg by mouth daily.   . methylPREDNISolone (MEDROL DOSEPAK) 4 MG TBPK tablet TAKE 6 TABLETS ON DAY 1 AS DIRECTED ON PACKAGE AND DECREASE BY 1 TAB EACH DAY FOR A TOTAL OF 6 DAYS  .  neomycin-polymyxin b-dexamethasone (MAXITROL) 3.5-10000-0.1 OINT PLACE SMALL RIBBON IN BOTH EYES 2 TIMES A DAY FOR 14 DAYS  . ondansetron (ZOFRAN) 4 MG tablet Take 1 tablet (4 mg total) by  mouth daily as needed for nausea or vomiting. (Patient not taking: Reported on 04/17/2018)  . oxyCODONE-acetaminophen (PERCOCET) 5-325 MG tablet Take 0.5 tablets by mouth every 4 (four) hours as needed for moderate pain or severe pain.  Marland Kitchen sertraline (ZOLOFT) 50 MG tablet TAKE 1 TABLET (50 MG TOTAL) BY MOUTH DAILY.   No facility-administered encounter medications on file as of 04/17/2018.     PATIENT'S DAUGHTER (MANAGES ALL MEDICATIONS FOR PATIENT) STATES PATIENT DOES NOT TAKE AMLODIPINE BUT DOES TAKE ATENOLOL. DAUGHTER FURTHER STATES SHE IS NOT ABLE TO RECALL DOSE OF ATENOLOL BUT FELT IT WAS A LOW DOSE RX. MEDICATION LIST DID NOT INDICATE PATIENT WAS TAKING THIS MEDICATION. AFTER FURTHER DISCUSSION WITH DR. LADA, ATENOLOL WAS ADDED TO THE PATIENT'S LIST AND AMLODIPINE WAS MARKED AS NOT TAKING. DAUGHTER HAS BEEN ADVISED TO CALL OUR OFFICE TO REVIEW THE ACTUAL MEDICATION THE PATIENT IS TAKING AS WELL AS THE PRESCRIBING PHYSICIAN. DAUGHTER VERBALIZED ACCEPTANCE AND UNDERSTANDING.  Activities of Daily Living In your present state of health, do you have any difficulty performing the following activities: 04/17/2018 01/02/2018  Hearing? N Y  Comment R hearing aid -  Vision? N Y  Comment wears eyeglasses -  Difficulty concentrating or making decisions? Y Y  Comment short term memory loss -  Walking or climbing stairs? Y Y  Comment avoids stairs -  Dressing or bathing? N N  Doing errands, shopping? Y Y  Comment dtr transports -  Conservation officer, nature and eating ? Y -  Comment full upper dentures; dtr buys TV dinners -  Using the Toilet? N -  In the past six months, have you accidently leaked urine? Y -  Comment wears depends; incontinent -  Do you have problems with loss of bowel control? Y -  Managing your Medications? Y -  Comment dtr manages -  Managing your Finances? Y -  Comment dtr manages -  Housekeeping or managing your Housekeeping? Y -  Comment dtr manages -  Some recent data might be hidden     Patient Care Team: Lada, Satira Anis, MD as PCP - General (Family Medicine) Noreene Filbert, MD as Consulting Physician (Radiation Oncology) Gabriel Carina, Betsey Holiday, MD as Physician Assistant (Internal Medicine) Laneta Simmers as Physician Assistant (Urology)    Assessment:   This is a routine wellness examination for Mattye.  Exercise Activities and Dietary recommendations Current Exercise Habits: The patient does not participate in regular exercise at present, Exercise limited by: None identified  Goals    . Exercise 3x per week (30 min per time)     Try to walk more    . Prevent falls     Recommend to remove any items from the home that may cause slips or trips.       Fall Risk Fall Risk  04/17/2018 01/02/2018 06/17/2017 05/17/2017 04/30/2017  Falls in the past year? Yes No No Yes Yes  Comment weakness led to falling on floor - - - -  Number falls in past yr: 2 or more - - 2 or more 2 or more  Injury with Fall? No - - No No  Comment - - - - -  Risk Factor Category  High Fall Risk - - High Fall Risk -  Risk for fall due to : Medication side  effect;Impaired balance/gait;Impaired vision;History of fall(s) - - - -  Risk for fall due to: Comment wears eyeglasses; ambulates with rolling walker - - - -  Follow up Falls evaluation completed;Education provided;Falls prevention discussed - - - -  Comment - - - - -   FALL RISK PREVENTION PERTAINING TO HOME: Is your home free of loose throw rugs in walkways, pet beds, electrical cords, etc? Yes Is there adequate lighting in your home to reduce risk of falls?  Yes Are there stairs in or around your home WITH handrails? No stairs  ASSISTIVE DEVICES UTILIZED TO PREVENT FALLS: Use of a cane, walker or w/c? Yes, rolling walker Grab bars in the bathroom? Yes  Shower chair or a place to sit while bathing? Yes An elevated toilet seat or a handicapped toilet? Yes  Timed Get Up and Go Performed: Yes. Pt ambulated 10 feet within 32 sec.  Gait slow, steady and without the use of an assistive device. No intervention required at this time. Fall risk prevention has been discussed.  Community Resource Referral:  [place] Referral not required at this time.   Depression Screen PHQ 2/9 Scores 04/17/2018 02/11/2018 01/02/2018 06/17/2017  PHQ - 2 Score 2 1 1  0  PHQ- 9 Score 9 7 7  -     Cognitive Function     6CIT Screen 04/17/2018 04/30/2017 10/04/2016  What Year? 0 points 0 points 0 points  What month? 0 points 0 points 0 points  What time? 3 points 0 points 0 points  Count back from 20 0 points 0 points 0 points  Months in reverse 0 points 0 points 0 points  Repeat phrase 2 points 0 points 2 points  Total Score 5 0 2    Immunization History  Administered Date(s) Administered  . Influenza, High Dose Seasonal PF 04/15/2015, 04/03/2016, 04/30/2017, 04/17/2018  . Pneumococcal-Unspecified 06/22/2014  . Tdap 11/25/2014    Qualifies for Shingles Vaccine? Yes. Due for Shingrix. Education has been provided regarding the importance of this vaccine. Pt has been advised to call insurance company to determine out of pocket expense. Advised may also receive vaccine at local pharmacy or Health Dept. Verbalized acceptance and understanding.  Screening Tests Health Maintenance  Topic Date Due  . OPHTHALMOLOGY EXAM  03/06/2018  . HEMOGLOBIN A1C  07/05/2018  . FOOT EXAM  01/03/2019  . URINE MICROALBUMIN  01/04/2019  . TETANUS/TDAP  11/24/2024  . INFLUENZA VACCINE  Completed  . DEXA SCAN  Completed  . PNA vac Low Risk Adult  Addressed    Cancer Screenings: Lung: Low Dose CT Chest recommended if Age 74-80 years, 30 pack-year currently smoking OR have quit w/in 15years. Patient does not qualify. Breast Screening: No longer required Up to date of Bone Density/Dexa? No. Completed 12/25/06. Results reflect osteopenia. Repeat every 2 years. Ordered on 01/03/18. Still not yet completed. Provided with contact info and advised to call  to schedule appt. Colorectal: No longer required  Additional Screenings: Hepatitis C Screening: Does not qualify    Plan:  I have personally reviewed and addressed the Medicare Annual Wellness questionnaire and have noted the following in the patient's chart:  A. Medical and social history B. Use of alcohol, tobacco or illicit drugs  C. Current medications and supplements D. Functional ability and status E.  Nutritional status F.  Physical activity G. Advance directives H. List of other physicians I.  Hospitalizations, surgeries, and ER visits in previous 12 months J.  Vitals K. Screenings such as hearing  and vision if needed, cognitive and depression L. Referrals and appointments  In addition, I have reviewed and discussed with patient certain preventive protocols, quality metrics, and best practice recommendations. A written personalized care plan for preventive services as well as general preventive health recommendations were provided to patient.  See attached scanned questionnaire for additional information.   Signed,  Aleatha Borer, LPN Nurse Health Advisor

## 2018-04-17 NOTE — Patient Instructions (Signed)
[name] , Thank you for taking time to come for your Medicare Wellness Visit. I appreciate your ongoing commitment to your health goals. Please review the following plan we discussed and let me know if I can assist you in the future.   Screening recommendations/referrals: Colorectal Screening: No longer required Mammogram: No longer required Bone Density: Please call to schedule your appointment  Vision and Dental Exams: Recommended annual ophthalmology exams for early detection of glaucoma and other disorders of the eye Recommended annual dental exams for proper oral hygiene  Diabetic Exams: Diabetic Eye Exam: You will receive a call from our office regarding your appointment Diabetic Foot Exam: Up to date  Vaccinations: Influenza vaccine: Completed today Pneumococcal vaccine: Up to date Tdap vaccine: Up to date Shingles vaccine: Please call your insurance company to determine your out of pocket expense for the Shingrix vaccine. You may receive this vaccine at your local pharmacy.  Advanced directives: Please bring a copy of your POA (Power of Attorney) and/or Living Will to your next appointment.  Goals: Recommend to remove any items from the home that may cause slips or trips.  Next appointment: Please schedule your Annual Wellness Visit with your Nurse Health Advisor in one year.  Preventive Care 18 Years and Older, Female Preventive care refers to lifestyle choices and visits with your health care provider that can promote health and wellness. What does preventive care include?  A yearly physical exam. This is also called an annual well check.  Dental exams once or twice a year.  Routine eye exams. Ask your health care provider how often you should have your eyes checked.  Personal lifestyle choices, including:  Daily care of your teeth and gums.  Regular physical activity.  Eating a healthy diet.  Avoiding tobacco and drug use.  Limiting alcohol  use.  Practicing safe sex.  Taking low-dose aspirin every day.  Taking vitamin and mineral supplements as recommended by your health care provider. What happens during an annual well check? The services and screenings done by your health care provider during your annual well check will depend on your age, overall health, lifestyle risk factors, and family history of disease. Counseling  Your health care provider may ask you questions about your:  Alcohol use.  Tobacco use.  Drug use.  Emotional well-being.  Home and relationship well-being.  Sexual activity.  Eating habits.  History of falls.  Memory and ability to understand (cognition).  Work and work Statistician.  Reproductive health. Screening  You may have the following tests or measurements:  Height, weight, and BMI.  Blood pressure.  Lipid and cholesterol levels. These may be checked every 5 years, or more frequently if you are over 14 years old.  Skin check.  Lung cancer screening. You may have this screening every year starting at age 41 if you have a 30-pack-year history of smoking and currently smoke or have quit within the past 15 years.  Fecal occult blood test (FOBT) of the stool. You may have this test every year starting at age 14.  Flexible sigmoidoscopy or colonoscopy. You may have a sigmoidoscopy every 5 years or a colonoscopy every 10 years starting at age 19.  Hepatitis C blood test.  Hepatitis B blood test.  Sexually transmitted disease (STD) testing.  Diabetes screening. This is done by checking your blood sugar (glucose) after you have not eaten for a while (fasting). You may have this done every 1-3 years.  Bone density scan. This is done  to screen for osteoporosis. You may have this done starting at age 33.  Mammogram. This may be done every 1-2 years. Talk to your health care provider about how often you should have regular mammograms. Talk with your health care provider about  your test results, treatment options, and if necessary, the need for more tests. Vaccines  Your health care provider may recommend certain vaccines, such as:  Influenza vaccine. This is recommended every year.  Tetanus, diphtheria, and acellular pertussis (Tdap, Td) vaccine. You may need a Td booster every 10 years.  Zoster vaccine. You may need this after age 33.  Pneumococcal 13-valent conjugate (PCV13) vaccine. One dose is recommended after age 73.  Pneumococcal polysaccharide (PPSV23) vaccine. One dose is recommended after age 68. Talk to your health care provider about which screenings and vaccines you need and how often you need them. This information is not intended to replace advice given to you by your health care provider. Make sure you discuss any questions you have with your health care provider. Document Released: 08/19/2015 Document Revised: 04/11/2016 Document Reviewed: 05/24/2015 Elsevier Interactive Patient Education  2017 [employer] Prevention in the Home Falls can cause injuries. They can happen to people of all ages. There are many things you can do to make your home safe and to help prevent falls. What can I do on the outside of my home?  Regularly fix the edges of walkways and driveways and fix any cracks.  Remove anything that might make you trip as you walk through a door, such as a raised step or threshold.  Trim any bushes or trees on the path to your home.  Use bright outdoor lighting.  Clear any walking paths of anything that might make someone trip, such as rocks or tools.  Regularly check to see if handrails are loose or broken. Make sure that both sides of any steps have handrails.  Any raised decks and porches should have guardrails on the edges.  Have any leaves, snow, or ice cleared regularly.  Use sand or salt on walking paths during winter.  Clean up any spills in your garage right away. This includes oil or grease spills. What  can I do in the bathroom?  Use night lights.  Install grab bars by the toilet and in the tub and shower. Do not use towel bars as grab bars.  Use non-skid mats or decals in the tub or shower.  If you need to sit down in the shower, use a plastic, non-slip stool.  Keep the floor dry. Clean up any water that spills on the floor as soon as it happens.  Remove soap buildup in the tub or shower regularly.  Attach bath mats securely with double-sided non-slip rug tape.  Do not have throw rugs and other things on the floor that can make you trip. What can I do in the bedroom?  Use night lights.  Make sure that you have a light by your bed that is easy to reach.  Do not use any sheets or blankets that are too big for your bed. They should not hang down onto the floor.  Have a firm chair that has side arms. You can use this for support while you get dressed.  Do not have throw rugs and other things on the floor that can make you trip. What can I do in the kitchen?  Clean up any spills right away.  Avoid walking on wet floors.  Keep items  that you use a lot in easy-to-reach places.  If you need to reach something above you, use a strong step stool that has a grab bar.  Keep electrical cords out of the way.  Do not use floor polish or wax that makes floors slippery. If you must use wax, use non-skid floor wax.  Do not have throw rugs and other things on the floor that can make you trip. What can I do with my stairs?  Do not leave any items on the stairs.  Make sure that there are handrails on both sides of the stairs and use them. Fix handrails that are broken or loose. Make sure that handrails are as long as the stairways.  Check any carpeting to make sure that it is firmly attached to the stairs. Fix any carpet that is loose or worn.  Avoid having throw rugs at the top or bottom of the stairs. If you do have throw rugs, attach them to the floor with carpet tape.  Make sure  that you have a light switch at the top of the stairs and the bottom of the stairs. If you do not have them, ask someone to add them for you. What else can I do to help prevent falls?  Wear shoes that:  Do not have high heels.  Have rubber bottoms.  Are comfortable and fit you well.  Are closed at the toe. Do not wear sandals.  If you use a stepladder:  Make sure that it is fully opened. Do not climb a closed stepladder.  Make sure that both sides of the stepladder are locked into place.  Ask someone to hold it for you, if possible.  Clearly mark and make sure that you can see:  Any grab bars or handrails.  First and last steps.  Where the edge of each step is.  Use tools that help you move around (mobility aids) if they are needed. These include:  Canes.  Walkers.  Scooters.  Crutches.  Turn on the lights when you go into a dark area. Replace any light bulbs as soon as they burn out.  Set up your furniture so you have a clear path. Avoid moving your furniture around.  If any of your floors are uneven, fix them.  If there are any pets around you, be aware of where they are.  Review your medicines with your doctor. Some medicines can make you feel dizzy. This can increase your chance of falling. Ask your doctor what other things that you can do to help prevent falls. This information is not intended to replace advice given to you by your health care provider. Make sure you discuss any questions you have with your health care provider. Document Released: 05/19/2009 Document Revised: 12/29/2015 Document Reviewed: 08/27/2014 Elsevier Interactive Patient Education  2017 [employer].

## 2018-04-18 ENCOUNTER — Telehealth: Payer: Self-pay | Admitting: Family Medicine

## 2018-04-18 NOTE — Telephone Encounter (Signed)
Copied from [place] [id number]. Topic: General - Other >> Apr 18, 2018  2:01 PM Lennox Solders wrote: Reason for CRM: pt had awv on 04-17-18. Pt daughter was ask the name of her mother bp  medication. The pt is taking amlodipine

## 2018-04-18 NOTE — Telephone Encounter (Signed)
Pt confirmed and updated in chart to taking

## 2018-04-21 ENCOUNTER — Ambulatory Visit: Payer: Medicare HMO | Admitting: Family Medicine

## 2018-05-06 ENCOUNTER — Ambulatory Visit: Payer: Medicare HMO | Admitting: Family Medicine

## 2018-05-12 ENCOUNTER — Ambulatory Visit (INDEPENDENT_AMBULATORY_CARE_PROVIDER_SITE_OTHER): Payer: Medicare HMO | Admitting: Family Medicine

## 2018-05-12 ENCOUNTER — Encounter: Payer: Self-pay | Admitting: Family Medicine

## 2018-05-12 VITALS — BP 130/70 | HR 91 | Temp 98.4°F | Resp 16 | Ht 62.0 in | Wt 160.8 lb

## 2018-05-12 DIAGNOSIS — K582 Mixed irritable bowel syndrome: Secondary | ICD-10-CM | POA: Diagnosis not present

## 2018-05-12 DIAGNOSIS — R1084 Generalized abdominal pain: Secondary | ICD-10-CM

## 2018-05-12 DIAGNOSIS — N390 Urinary tract infection, site not specified: Secondary | ICD-10-CM

## 2018-05-12 DIAGNOSIS — H81312 Aural vertigo, left ear: Secondary | ICD-10-CM

## 2018-05-12 DIAGNOSIS — R3129 Other microscopic hematuria: Secondary | ICD-10-CM

## 2018-05-12 LAB — POCT URINALYSIS DIPSTICK
Bilirubin, UA: NEGATIVE
Glucose, UA: NEGATIVE
Ketones, UA: NEGATIVE
Nitrite, UA: NEGATIVE
Protein, UA: POSITIVE — AB
Spec Grav, UA: 1.025
Urobilinogen, UA: NEGATIVE U/dL — AB
pH, UA: 5

## 2018-05-12 NOTE — Progress Notes (Signed)
Name: [name]   MRN: [id number]    DOB: March 23, 1930   Date:05/12/2018       Progress Note  Subjective  Chief Complaint  Chief Complaint  Patient presents with  . URI    patient stated her glands swollen, ear pain, headache, chest congestion for 5 days    HPI  Pt presents with her daughter who assists with the history.  Generalized abdominal pain started 05/08/2018.  She endorses constipation, took a laxative at 0500 today and has been having BM's today (she has IBS with diarrhea/constipation).  She notes R lower abdominal pain is sharp; generalized pain is described as constant with nausea. Endorses some intermittent feverish feeling, but daughter notes pt's husband keeps their home between 80-90 degrees. - She notes a history of recurrent UTI's and kidney stones and diverticulosis/diverticulitis. CT abdomen/pelvis from 01/02/2018 showed 2 non-obstructive stones in the RIGHT kidney.  Also noted diverticulosis without diverticulitis.  Dizziness - she takes Meclizine as needed; she takes 82m Meclizine in the morning frequently because it is the only way she can get out of bed without feeling like she is going to fall.  She has not seen ENT for dizziness in the past.  Discussed risk of taking too much meclizine, discussed fall precautions.    Patient Active Problem List   Diagnosis Date Noted  . DNAR (do not attempt resuscitation) 04/30/2017  . Preventative health care 04/30/2017  . Thickening of esophagus 01/11/2017  . Lumbar arthropathy 10/04/2016  . Vertigo, aural, left 07/31/2016  . Rectal pain 06/25/2016  . External hemorrhoid 06/18/2016  . Head injury due to trauma 05/09/2016  . Neoplasm of uncertain behavior of skin 05/09/2016  . Hemorrhoids 04/24/2016  . Dementia ([facility] 04/24/2016  . Medication monitoring encounter 04/03/2016  . Gait abnormality 04/03/2016  . Skin lump of leg 04/03/2016  . Pain in right knee 02/15/2016  . Pain and swelling of right knee 02/13/2016    . Right knee sprain 02/13/2016  . Solitary pulmonary nodule on lung CT 12/04/2015  . Generalized degenerative joint disease of hand 11/21/2015  . Triggering of digit 11/21/2015  . Snapping thumb syndrome 11/21/2015  . Pain, joint, hand 11/11/2015  . Coronary artery calcification seen on CAT scan 11/11/2015  . Multiple sclerosis ([facility] 11/11/2015  . Chronic pain of multiple joints 09/02/2015  . Right lower quadrant abdominal pain 08/02/2015  . Microscopic hematuria 07/12/2015  . Recurrent UTI 04/09/2015  . Carpal tunnel syndrome 01/12/2014  . DD (diverticular disease) 01/12/2014  . Ductal carcinoma in situ of left breast 01/12/2014  . Hypercholesteremia 01/12/2014  . DS (disseminated sclerosis) ([facility] 01/12/2014  . Arthritis, degenerative 01/12/2014  . Thyroid nodule 01/12/2014  . Cyanocobalamine deficiency (non anemic) 01/12/2014  . Anxiety and depression 01/12/2014  . Intraductal carcinoma of breast 01/12/2014  . History of right mastectomy 01/12/2014  . DM (diabetes mellitus) type II controlled, neurological manifestation ([facility] 06/26/2010  . Depression, major, recurrent, in partial remission ([facility] 06/26/2010  . Frequent falls 06/26/2010  . Stricture and stenosis of esophagus 06/26/2010  . Gastro-esophageal reflux disease without esophagitis 06/26/2010  . Fibromyalgia 06/26/2010  . Chronic interstitial cystitis 06/26/2010  . H/O malignant neoplasm of breast 06/26/2010  . Hypertension goal BP (blood pressure) < 140/90 06/01/2009  . H/O paroxysmal supraventricular tachycardia 06/01/2009  . Paroxysmal supraventricular tachycardia ([facility] 06/01/2009  . Irritable bowel syndrome with constipation and diarrhea 06/16/2008  . Diarrhea, functional 06/16/2008  . Diarrhea, unspecified 06/16/2008    Social History  Tobacco Use  . Smoking status: Never Smoker  . Smokeless tobacco: Never Used  . Tobacco comment: smoking cessation materials not required  Substance Use Topics  . Alcohol  use: No    Alcohol/week: 0.0 standard drinks     Current Outpatient Medications:  .  acetaminophen (TYLENOL) 500 MG tablet, Take 500 mg by mouth every 8 (eight) hours as needed. , Disp: , Rfl:  .  amLODipine (NORVASC) 2.5 MG tablet, TAKE 1 TABLET (2.5 MG TOTAL) BY MOUTH DAILY., Disp: 30 tablet, Rfl: 11 .  aspirin 81 MG tablet, Take 81 mg by mouth daily. , Disp: , Rfl:  .  atenolol (TENORMIN) 25 MG tablet, Take by mouth daily., Disp: , Rfl:  .  atorvastatin (LIPITOR) 40 MG tablet, TAKE 1 TABLET BY MOUTH EVERYDAY AT BEDTIME, Disp: 90 tablet, Rfl: 2 .  b complex vitamins tablet, Take 1 tablet by mouth daily., Disp: , Rfl:  .  Carboxymethylcell-Hypromellose (GENTEAL OP), Apply 1 drop to eye. Each eye as needed, Disp: , Rfl:  .  cetirizine (ZYRTEC) 10 MG tablet, Take 10 mg daily by mouth. , Disp: , Rfl:  .  Cholecalciferol (VITAMIN D-1000 MAX ST) 1000 units tablet, Take 5,000 Units once a week by mouth. , Disp: , Rfl:  .  Cyanocobalamin (VITAMIN B-12) 1000 MCG SUBL, daily. , Disp: , Rfl:  .  dicyclomine (BENTYL) 10 MG capsule, Take 10 mg as needed by mouth. , Disp: , Rfl:  .  DULoxetine (CYMBALTA) 20 MG capsule, Take 20 mg by mouth daily., Disp: , Rfl: 0 .  DULoxetine (CYMBALTA) 30 MG capsule, Take 30 mg by mouth daily., Disp: , Rfl:  .  ferrous sulfate 325 (65 FE) MG tablet, Take 325 mg daily by mouth. , Disp: , Rfl:  .  fluticasone (FLONASE) 50 MCG/ACT nasal spray, SPRAY 2 SPRAYS EACH INTO BOTH NOSTRILS ONCE prn, Disp: , Rfl: 5 .  Incontinence Supply Disposable (DEPEND UNDERWEAR LARGE) MISC, , Disp: , Rfl:  .  LACTOBACILLUS PO, Take daily by mouth. , Disp: , Rfl:  .  loratadine (CLARITIN) 10 MG tablet, Take 10 mg by mouth daily., Disp: , Rfl:  .  Loratadine 10 MG CAPS, Take 10 mg by mouth daily. , Disp: , Rfl:  .  meclizine (ANTIVERT) 25 MG tablet, TAKE 1 TABLET (25 MG TOTAL) BY MOUTH EVERY 8 (EIGHT) HOURS AS NEEDED FOR DIZZINESS., Disp: 30 tablet, Rfl: 1 .  memantine (NAMENDA) 10 MG tablet,  Take 10 mg by mouth 2 (two) times daily. , Disp: , Rfl:  .  methylPREDNISolone (MEDROL DOSEPAK) 4 MG TBPK tablet, TAKE 6 TABLETS ON DAY 1 AS DIRECTED ON PACKAGE AND DECREASE BY 1 TAB EACH DAY FOR A TOTAL OF 6 DAYS, Disp: , Rfl: 0 .  neomycin-polymyxin b-dexamethasone (MAXITROL) 3.5-10000-0.1 OINT, PLACE SMALL RIBBON IN BOTH EYES 2 TIMES A DAY FOR 14 DAYS, Disp: , Rfl: 0 .  omeprazole (PRILOSEC) 20 MG capsule, TAKE 1 CAPSULE BY MOUTH EVERY DAY, Disp: 90 capsule, Rfl: 1 .  oxyCODONE-acetaminophen (PERCOCET) 5-325 MG tablet, Take 0.5 tablets by mouth every 4 (four) hours as needed for moderate pain or severe pain., Disp: 2 tablet, Rfl: 0 .  pioglitazone (ACTOS) 15 MG tablet, Take 1 tablet (15 mg total) by mouth daily. For diabetes, Disp: 30 tablet, Rfl: 5 .  polyethylene glycol powder (GLYCOLAX/MIRALAX) powder, 17 grams daily, Disp: 255 g, Rfl: 3 .  Probiotic Product (PROBIOTIC COLON SUPPORT) CAPS, Take 1 capsule by mouth daily. , Disp: ,  Rfl:  .  sertraline (ZOLOFT) 50 MG tablet, TAKE 1 TABLET (50 MG TOTAL) BY MOUTH DAILY., Disp: 90 tablet, Rfl: 2 .  tiZANidine (ZANAFLEX) 2 MG tablet, Take 2 mg every 6 (six) hours by mouth., Disp: , Rfl: 1 .  ondansetron (ZOFRAN) 4 MG tablet, Take 1 tablet (4 mg total) by mouth daily as needed for nausea or vomiting. (Patient not taking: Reported on 04/17/2018), Disp: 30 tablet, Rfl: 0  Allergies  Allergen Reactions  . Aricept [Donepezil Hcl] Other (See Comments)    hallucinations  . Biaxin [Clarithromycin] Other (See Comments) and Diarrhea  . Copaxone [Glatiramer Acetate]   . Decadron [Dexamethasone] Nausea And Vomiting and Other (See Comments)    Other reaction(s): Muscle Pain Reaction:  Abdominal pain  . Dexamethasone Sodium Phosphate Other (See Comments)  . Diltiazem Hcl Other (See Comments)    Reaction:  Unknown   . Diltiazem Hcl Other (See Comments)  . Flagyl [Metronidazole] Nausea Only and Diarrhea    Other reaction(s): Vomiting  . Gabapentin Other  (See Comments)    "burning all over" Reaction:  Unknown   . Iohexol Swelling and Other (See Comments)     Desc: tongue swelling with "IVP dye" sccording to nurses notes with a lumbar myelo-12/09- asm, Onset Date: [id number]  Pts tongue swells.  Clementeen Hoof [Iodinated Diagnostic Agents] Swelling and Other (See Comments)    Passed out  Pts tongue swells.   . Levothyroxine Nausea Only  . Macrobid [Nitrofurantoin Macrocrystal] Nausea And Vomiting  . Sulfa Antibiotics Other (See Comments)    Reaction:  Unknown  Reaction:  Unknown   . Sulfonamide Derivatives Other (See Comments)    Reaction:  Unknown   . Synthroid [Levothyroxine Sodium]   . Copaxone  [Glatiramer] Rash  . Interferon Beta-1a Other (See Comments) and Rash    Reaction:  Unknown     I personally reviewed active problem list, medication list, allergies, lab results, imaging with the patient/caregiver today.  ROS  Ten systems reviewed and is negative except as mentioned in HPI  Objective  Vitals:   05/12/18 1502  BP: 130/70  Pulse: 91  Resp: 16  Temp: 98.4 F (36.9 C)  TempSrc: Oral  SpO2: 98%  Weight: 160 lb 12.8 oz (72.9 kg)  Height: _0  (1.575 m)   Body mass index is 29.41 kg/m.  Nursing Note and Vital Signs reviewed.  Physical Exam  Constitutional: Patient appears well-developed and well-nourished. No distress.  HENT: Head: Normocephalic and atraumatic. Ears: bilateral TMs with no erythema or effusion; Nose: Nose normal. Mouth/Throat: Oropharynx is clear and moist. No oropharyngeal exudate or tonsillar swelling.  Eyes: Conjunctivae and EOM are normal. No scleral icterus.  Pupils are equal, round, and reactive to light.  Neck: Normal range of motion. Neck supple. No JVD present. No thyromegaly present.  Cardiovascular: Normal rate, regular rhythm and normal heart sounds.  No murmur heard. No BLE edema. Pulmonary/Chest: Effort normal and breath sounds normal. No respiratory distress. Abdominal: Soft but  slightly distended. Bowel sounds are normal. There is tenderness that is generalized. No masses. Musculoskeletal: Normal range of motion, no joint effusions. No gross deformities Neurological: Pt is alert and oriented to person, place, and time. No cranial nerve deficit. Coordination, balance, strength, speech and gait are normal.  Skin: Skin is warm and dry. No rash noted. No erythema.  Psychiatric: Patient has a normal mood and affect. behavior is normal. Judgment and thought content normal.  No results found for this or any  previous visit (from the past 72 hour(s)).  Assessment & Plan  1. Generalized abdominal pain - POCT urinalysis dipstick - Urine Culture - CBC w/Diff/Platelet - COMPLETE METABOLIC PANEL WITH GFR  2. Vertigo, aural, left - Ambulatory referral to ENT  3. Irritable bowel syndrome with constipation and diarrhea - PRN medications  4. Recurrent UTI - POCT urinalysis dipstick - Urine Culture  5. Microscopic hematuria - POCT urinalysis dipstick  - Discussed case with PCP Dr. Sanda Klein who agrees with the above plan of care.  Discussed plan with patient and with her daughter - she has had extensive testing and imaging in the past, vitals today are reassuring.  Pain is non-focal, so I do not suspect diverticulitis at this time, however red flags are discussed as below.  Sending urine for culture to r/o UTI.  We will check labs as above to evaluate for infection and/or other abnormality.  I suspect constipation was initially to blame, then after laxative this morning, she is having diarrhea.  Advised against laxative in the future and to use Miralax PRN.  -Red flags and when to present for emergency care or RTC including fever >101.33F, chest pain, shortness of breath, new/worsening/un-resolving symptoms, abdominal distension or rigidity reviewed with patient at time of visit. Follow up and care instructions discussed and provided in AVS.

## 2018-05-13 ENCOUNTER — Emergency Department: Payer: Medicare HMO

## 2018-05-13 ENCOUNTER — Emergency Department
Admission: EM | Admit: 2018-05-13 | Discharge: 2018-05-13 | Disposition: A | Discharge status: Home / Self Care | Payer: Medicare HMO | Attending: Emergency Medicine | Admitting: Emergency Medicine

## 2018-05-13 ENCOUNTER — Other Ambulatory Visit: Payer: Self-pay

## 2018-05-13 ENCOUNTER — Encounter: Payer: Self-pay | Admitting: Emergency Medicine

## 2018-05-13 ENCOUNTER — Other Ambulatory Visit: Payer: Medicare HMO

## 2018-05-13 DIAGNOSIS — I2510 Atherosclerotic heart disease of native coronary artery without angina pectoris: Secondary | ICD-10-CM | POA: Insufficient documentation

## 2018-05-13 DIAGNOSIS — Z79899 Other long term (current) drug therapy: Secondary | ICD-10-CM | POA: Insufficient documentation

## 2018-05-13 DIAGNOSIS — E1149 Type 2 diabetes mellitus with other diabetic neurological complication: Secondary | ICD-10-CM | POA: Insufficient documentation

## 2018-05-13 DIAGNOSIS — R102 Pelvic and perineal pain: Secondary | ICD-10-CM | POA: Diagnosis not present

## 2018-05-13 DIAGNOSIS — R109 Unspecified abdominal pain: Secondary | ICD-10-CM

## 2018-05-13 DIAGNOSIS — N281 Cyst of kidney, acquired: Secondary | ICD-10-CM | POA: Diagnosis not present

## 2018-05-13 DIAGNOSIS — R1084 Generalized abdominal pain: Secondary | ICD-10-CM | POA: Diagnosis not present

## 2018-05-13 DIAGNOSIS — Z7982 Long term (current) use of aspirin: Secondary | ICD-10-CM | POA: Diagnosis not present

## 2018-05-13 DIAGNOSIS — R5381 Other malaise: Secondary | ICD-10-CM | POA: Diagnosis not present

## 2018-05-13 DIAGNOSIS — N200 Calculus of kidney: Secondary | ICD-10-CM | POA: Diagnosis not present

## 2018-05-13 LAB — URINALYSIS, COMPLETE (UACMP) WITH MICROSCOPIC
Bacteria, UA: NONE SEEN
Bilirubin Urine: NEGATIVE
GLUCOSE, UA: NEGATIVE mg/dL
Ketones, ur: NEGATIVE mg/dL
Leukocytes, UA: NEGATIVE
Nitrite: NEGATIVE
PROTEIN: NEGATIVE mg/dL
SPECIFIC GRAVITY, URINE: 1.011 (ref 1.005–1.030)
pH: 5 (ref 5.0–8.0)

## 2018-05-13 LAB — COMPREHENSIVE METABOLIC PANEL
ALBUMIN: 4 g/dL (ref 3.5–5.0)
ALK PHOS: 45 U/L (ref 38–126)
ALT: 20 U/L (ref 0–44)
AST: 13 U/L — AB (ref 15–41)
Anion gap: 6 (ref 5–15)
BILIRUBIN TOTAL: 0.7 mg/dL (ref 0.3–1.2)
BUN: 17 mg/dL (ref 8–23)
CALCIUM: 9.4 mg/dL (ref 8.9–10.3)
CO2: 29 mmol/L (ref 22–32)
CREATININE: 0.84 mg/dL (ref 0.44–1.00)
Chloride: 106 mmol/L (ref 98–111)
GFR calc Af Amer: >60 mL/min (ref >60)
GFR calc non Af Amer: >60 mL/min (ref >60)
GLUCOSE: 137 mg/dL — AB (ref 70–99)
Potassium: 4.2 mmol/L (ref 3.5–5.1)
SODIUM: 141 mmol/L (ref 135–145)
TOTAL PROTEIN: 6.9 g/dL (ref 6.5–8.1)

## 2018-05-13 LAB — COMPLETE METABOLIC PANEL WITH GFR
AG RATIO: 1.6 (calc) (ref 1.0–2.5)
ALT: 19 U/L (ref 6–29)
AST: 8 U/L — AB (ref 10–35)
Albumin: 4 g/dL (ref 3.6–5.1)
Alkaline phosphatase (APISO): 47 U/L (ref 33–130)
BUN/Creatinine Ratio: 21 (calc) (ref 6–22)
BUN: 20 mg/dL (ref 7–25)
CO2: 28 mmol/L (ref 20–32)
CREATININE: 0.94 mg/dL — AB (ref 0.60–0.88)
Calcium: 9.6 mg/dL (ref 8.6–10.4)
Chloride: 104 mmol/L (ref 98–110)
GFR, EST AFRICAN AMERICAN: 64 mL/min/{1.73_m2} (ref >=60)
GFR, Est Non African American: 55 mL/min/{1.73_m2} — ABNORMAL LOW (ref >=60)
Globulin: 2.5 g/dL (calc) (ref 1.9–3.7)
Glucose, Bld: 137 mg/dL — ABNORMAL HIGH (ref 65–99)
Potassium: 3.9 mmol/L (ref 3.5–5.3)
Sodium: 139 mmol/L (ref 135–146)
TOTAL PROTEIN: 6.5 g/dL (ref 6.1–8.1)
Total Bilirubin: 0.4 mg/dL (ref 0.2–1.2)

## 2018-05-13 LAB — CBC WITH DIFFERENTIAL/PLATELET
ABS IMMATURE GRANULOCYTES: 0.01 10*3/uL (ref 0.00–0.07)
BASOS PCT: 0.2 %
BASOS PCT: 0 %
Basophils Absolute: 8 cells/uL (ref 0–200)
Basophils Absolute: 0 10*3/uL (ref 0.0–0.1)
EOS ABS: 50 {cells}/uL (ref 15–500)
EOS PCT: 1 %
Eosinophils Absolute: 0.1 10*3/uL (ref 0.0–0.5)
Eosinophils Relative: 1.2 %
HCT: 39.3 % (ref 35.0–45.0)
HCT: 38.8 % (ref 36.0–46.0)
HEMOGLOBIN: 13.4 g/dL (ref 11.7–15.5)
Hemoglobin: 12.8 g/dL (ref 12.0–15.0)
IMMATURE GRANULOCYTES: 0 %
LYMPHS ABS: 1273 {cells}/uL (ref 850–3900)
Lymphocytes Relative: 35 %
Lymphs Abs: 1.4 10*3/uL (ref 0.7–4.0)
MCH: 30.9 pg (ref 27.0–33.0)
MCH: 31.1 pg (ref 26.0–34.0)
MCHC: 34.1 g/dL (ref 32.0–36.0)
MCHC: 33 g/dL (ref 30.0–36.0)
MCV: 90.6 fL (ref 80.0–100.0)
MCV: 94.4 fL (ref 80.0–100.0)
MONO ABS: 0.4 10*3/uL (ref 0.1–1.0)
MPV: 10.7 fL (ref 7.5–12.5)
Monocytes Relative: 9.4 %
Monocytes Relative: 9 %
NEUTROS ABS: 2474 {cells}/uL (ref 1500–7800)
NEUTROS ABS: 2.2 10*3/uL (ref 1.7–7.7)
NRBC: 0 % (ref 0.0–0.2)
Neutrophils Relative %: 58.9 %
Neutrophils Relative %: 55 %
PLATELETS: 196 10*3/uL (ref 140–400)
PLATELETS: 161 10*3/uL (ref 150–400)
RBC: 4.34 10*6/uL (ref 3.80–5.10)
RBC: 4.11 MIL/uL (ref 3.87–5.11)
RDW: 13.3 % (ref 11.0–15.0)
RDW: 14 % (ref 11.5–15.5)
Total Lymphocyte: 30.3 %
WBC: 4.2 10*3/uL (ref 3.8–10.8)
WBC: 4.1 10*3/uL (ref 4.0–10.5)
WBCMIX: 395 {cells}/uL (ref 200–950)

## 2018-05-13 LAB — TROPONIN I

## 2018-05-13 LAB — CBC
HCT: 41.1 % (ref 36.0–46.0)
Hemoglobin: 13.7 g/dL (ref 12.0–15.0)
MCH: 31.5 pg (ref 26.0–34.0)
MCHC: 33.3 g/dL (ref 30.0–36.0)
MCV: 94.5 fL (ref 80.0–100.0)
PLATELETS: 178 10*3/uL (ref 150–400)
RBC: 4.35 MIL/uL (ref 3.87–5.11)
RDW: 14.1 % (ref 11.5–15.5)
WBC: 3.7 10*3/uL — ABNORMAL LOW (ref 4.0–10.5)
nRBC: 0 % (ref 0.0–0.2)

## 2018-05-13 LAB — URINE CULTURE
MICRO NUMBER: 91203452
SPECIMEN QUALITY: ADEQUATE

## 2018-05-13 LAB — SEDIMENTATION RATE: SED RATE: 6 mm/h (ref 0–30)

## 2018-05-13 LAB — LACTIC ACID, PLASMA: LACTIC ACID, VENOUS: 0.8 mmol/L (ref 0.5–1.9)

## 2018-05-13 LAB — LIPASE, BLOOD: Lipase: 22 U/L (ref 11–51)

## 2018-05-13 LAB — TSH: TSH: 2.742 u[IU]/mL (ref 0.350–4.500)

## 2018-05-13 MED ORDER — HYDROCORTISONE NA SUCCINATE PF 100 MG IJ SOLR
INTRAMUSCULAR | Status: AC
  Administered 2018-05-13: 200 mg
  Filled 2018-05-13: qty 4

## 2018-05-13 MED ORDER — HYDROCORTISONE NA SUCCINATE PF 250 MG IJ SOLR
200.0000 mg | Freq: Once | INTRAMUSCULAR | Status: DC
  Filled 2018-05-13: qty 200

## 2018-05-13 MED ORDER — DIPHENHYDRAMINE HCL 50 MG/ML IJ SOLN: 50.0000 mg | Freq: Once | INTRAMUSCULAR | Status: AC

## 2018-05-13 MED ORDER — MORPHINE SULFATE (PF) 2 MG/ML IV SOLN
2.0000 mg | Freq: Once | INTRAVENOUS | Status: AC
  Administered 2018-05-13: 2 mg via INTRAVENOUS
  Filled 2018-05-13: qty 1

## 2018-05-13 MED ORDER — ACETAMINOPHEN 500 MG PO TABS
1000.0000 mg | ORAL_TABLET | Freq: Once | ORAL | Status: AC
  Administered 2018-05-13: 1000 mg via ORAL
  Filled 2018-05-13: qty 2

## 2018-05-13 MED ORDER — ONDANSETRON HCL 4 MG/2ML IJ SOLN
4.0000 mg | Freq: Once | INTRAMUSCULAR | Status: AC
  Administered 2018-05-13: 4 mg via INTRAVENOUS
  Filled 2018-05-13: qty 2

## 2018-05-13 MED ORDER — MORPHINE SULFATE (PF) 4 MG/ML IV SOLN
4.0000 mg | Freq: Once | INTRAVENOUS | Status: AC
  Administered 2018-05-13: 2 mg via INTRAVENOUS
  Filled 2018-05-13: qty 1

## 2018-05-13 MED ORDER — DIPHENHYDRAMINE HCL 25 MG PO CAPS
50.0000 mg | ORAL_CAPSULE | Freq: Once | ORAL | Status: AC
  Administered 2018-05-13: 50 mg via ORAL
  Filled 2018-05-13: qty 2

## 2018-05-13 MED ORDER — IOPAMIDOL (ISOVUE-370) INJECTION 76%
100.0000 mL | Freq: Once | INTRAVENOUS | Status: AC | PRN
  Administered 2018-05-13: 100 mL via INTRAVENOUS

## 2018-05-13 NOTE — Discharge Instructions (Addendum)
Please follow-up with Dr. Vicente Males so he can see if you need to have your esophagus stretched again now.  Please see Dr. Tami Ribas for your draining left ear and your dizziness.   Results for orders placed or performed during the hospital encounter of 05/13/18  Lipase, blood  Result Value Ref Range   Lipase 22 11 - 51 U/L  Comprehensive metabolic panel  Result Value Ref Range   Sodium 141 135 - 145 mmol/L   Potassium 4.2 3.5 - 5.1 mmol/L   Chloride 106 98 - 111 mmol/L   CO2 29 22 - 32 mmol/L   Glucose, Bld 137 (H) 70 - 99 mg/dL   BUN 17 8 - 23 mg/dL   Creatinine, Ser 0.84 0.44 - 1.00 mg/dL   Calcium 9.4 8.9 - 10.3 mg/dL   Total Protein 6.9 6.5 - 8.1 g/dL   Albumin 4.0 3.5 - 5.0 g/dL   AST 13 (L) 15 - 41 U/L   ALT 20 0 - 44 U/L   Alkaline Phosphatase 45 38 - 126 U/L   Total Bilirubin 0.7 0.3 - 1.2 mg/dL   GFR calc non Af Amer >60 >60 mL/min   GFR calc Af Amer >60 >60 mL/min   Anion gap 6 5 - 15  CBC  Result Value Ref Range   WBC 3.7 (L) 4.0 - 10.5 K/uL   RBC 4.35 3.87 - 5.11 MIL/uL   Hemoglobin 13.7 12.0 - 15.0 g/dL   HCT 41.1 36.0 - 46.0 %   MCV 94.5 80.0 - 100.0 fL   MCH 31.5 26.0 - 34.0 pg   MCHC 33.3 30.0 - 36.0 g/dL   RDW 14.1 11.5 - 15.5 %   Platelets 178 150 - 400 K/uL   nRBC 0.0 0.0 - 0.2 %  Urinalysis, Complete w Microscopic  Result Value Ref Range   Color, Urine YELLOW (A) YELLOW   APPearance CLEAR (A) CLEAR   Specific Gravity, Urine 1.011 1.005 - 1.030   pH 5.0 5.0 - 8.0   Glucose, UA NEGATIVE NEGATIVE mg/dL   Hgb urine dipstick SMALL (A) NEGATIVE   Bilirubin Urine NEGATIVE NEGATIVE   Ketones, ur NEGATIVE NEGATIVE mg/dL   Protein, ur NEGATIVE NEGATIVE mg/dL   Nitrite NEGATIVE NEGATIVE   Leukocytes, UA NEGATIVE NEGATIVE   RBC / HPF 0-5 0 - 5 RBC/hpf   WBC, UA 0-5 0 - 5 WBC/hpf   Bacteria, UA NONE SEEN NONE SEEN   Squamous Epithelial / LPF 0-5 0 - 5  Troponin I  Result Value Ref Range   Troponin I <0.03 <0.03 ng/mL  TSH  Result Value Ref Range   TSH 2.742  0.350 - 4.500 uIU/mL  Sedimentation rate  Result Value Ref Range   Sed Rate 6 0 - 30 mm/hr  CBC with Differential  Result Value Ref Range   WBC 4.1 4.0 - 10.5 K/uL   RBC 4.11 3.87 - 5.11 MIL/uL   Hemoglobin 12.8 12.0 - 15.0 g/dL   HCT 38.8 36.0 - 46.0 %   MCV 94.4 80.0 - 100.0 fL   MCH 31.1 26.0 - 34.0 pg   MCHC 33.0 30.0 - 36.0 g/dL   RDW 14.0 11.5 - 15.5 %   Platelets 161 150 - 400 K/uL   nRBC 0.0 0.0 - 0.2 %   Neutrophils Relative % 55 %   Neutro Abs 2.2 1.7 - 7.7 K/uL   Lymphocytes Relative 35 %   Lymphs Abs 1.4 0.7 - 4.0 K/uL   Monocytes Relative 9 %  Monocytes Absolute 0.4 0.1 - 1.0 K/uL   Eosinophils Relative 1 %   Eosinophils Absolute 0.1 0.0 - 0.5 K/uL   Basophils Relative 0 %   Basophils Absolute 0.0 0.0 - 0.1 K/uL   Immature Granulocytes 0 %   Abs Immature Granulocytes 0.01 0.00 - 0.07 K/uL  Lactic acid, plasma  Result Value Ref Range   Lactic Acid, Venous 0.8 0.5 - 1.9 mmol/L   Ct Abdomen Pelvis Wo Contrast  Result Date: 05/13/2018 CLINICAL DATA:  84 year old female with acute abdominal and pelvic pain with nausea for several days. EXAM: CT ABDOMEN AND PELVIS WITHOUT CONTRAST TECHNIQUE: Multidetector CT imaging of the abdomen and pelvis was performed following the standard protocol without IV contrast. COMPARISON:  01/02/2018 and prior CTs FINDINGS: Please note that parenchymal abnormalities may be missed without intravenous contrast. Lower chest: No acute abnormality. Hepatobiliary: No significant hepatic abnormalities identified. A central hepatic cyst is again noted. Patient is status post cholecystectomy. No biliary dilatation. Pancreas: Unremarkable Spleen: Unchanged splenic cyst again noted. Adrenals/Urinary Tract: Bilateral renal atrophy and stable indeterminate bilateral renal lesions again noted-probably cysts. Punctate nonobstructing RIGHT renal calculi again noted. The bladder and adrenal glands are unremarkable. No evidence of hydronephrosis or obstructing  urinary calculi. Stomach/Bowel: Stomach is within normal limits. No evidence of bowel wall thickening, distention, or inflammatory changes. Colonic diverticulosis noted without evidence of diverticulitis. Vascular/Lymphatic: Aortic atherosclerosis. No enlarged abdominal or pelvic lymph nodes. Reproductive: Status post hysterectomy. No adnexal masses. Other: No free fluid, pneumoperitoneum or focal collection. Musculoskeletal: No acute or suspicious bony abnormalities. Multilevel degenerative changes throughout the lumbar spine noted with surgical fusion at L4-5 again noted. IMPRESSION: 1. No evidence of acute abnormality. 2. Bilateral renal atrophy, punctate nonobstructing RIGHT renal calculi and stable probable bilateral renal cysts. 3.  Aortic Atherosclerosis (ICD10-I70.0). Electronically Signed   By: Margarette Canada M.D.   On: 05/13/2018 13:41   Ct Angio Abd/pel W And/or Wo Contrast  Result Date: 05/13/2018 CLINICAL DATA:  Generalized abdominal pain and nausea. EXAM: CTA ABDOMEN AND PELVIS WITHOUT AND WITH CONTRAST TECHNIQUE: Multidetector CT imaging of the abdomen and pelvis was performed using the standard protocol during bolus administration of intravenous contrast. Multiplanar reconstructed images and MIPs were obtained and reviewed to evaluate the vascular anatomy. CONTRAST:  143mL ISOVUE-370 IOPAMIDOL (ISOVUE-370) INJECTION 76% after premedication x4 hours. No immediate adverse effect after contrast administration. COMPARISON:  Noncontrast study from 05/13/2018 FINDINGS: VASCULAR Aorta: The abdominal aorta is not aneurysmal. No dissection is identified. Minimal atherosclerosis is noted of the abdominal aorta. Celiac: Patent without stenosis, aneurysm or dissection. SMA: Well opacified without occlusion, stenosis or aneurysm. No dissection. Renals: Both renal arteries are patent without evidence of aneurysm, dissection, vasculitis, fibromuscular dysplasia or significant stenosis. IMA: Patent without evidence  of aneurysm, dissection, vasculitis or significant stenosis. Inflow: Patent without evidence of aneurysm, dissection, vasculitis or significant stenosis. Proximal Outflow: Bilateral common femoral and visualized portions of the superficial and profunda femoral arteries are patent without evidence of aneurysm, dissection, vasculitis or significant stenosis. Veins: Patent portal and splenic veins. IVC is unremarkable without occlusion or thrombus. Review of the MIP images confirms the above findings. NON-VASCULAR Lower chest: Normal heart size.  Clear lung bases. Hepatobiliary: Small hypodensities scattered throughout the liver compatible with cysts, most of which are too small to characterize. Mild intrahepatic ductal dilatation on the left may simply reflect reservoir effect status post cholecystectomy. Pancreas: Normal Spleen: 3.8 cm cyst of the spleen unchanged. Adrenals/Urinary Tract: Normal bilateral adrenal glands. Bilateral  renal cysts and mild areas of cortical thinning. No obstructive uropathy. Previously noted punctate right-sided renal calculi are obscured by contrast. The urinary bladder is unremarkable for the degree of distention. Stomach/Bowel: There is colonic diverticulosis without acute diverticulitis. Status post appendectomy. No wall thickening, distention or inflammation. The stomach is within normal limits. Lymphatic: No adenopathy Reproductive: Hysterectomy.  No adnexal mass. Other: No free air nor free fluid. Musculoskeletal: Lumbar spondylosis with surgical fusion at L4-5 IMPRESSION: VASCULAR Nonaneurysmal abdominal aorta with patent branch vessels. No findings of mesenteric ischemia. NON-VASCULAR 1. Renal atrophy with stable bilateral renal cysts. 2. 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 3. Status post cholecystectomy. Tiny hypodensities in the liver compatible with cysts but too small to characterize. 4. Lumbar spondylosis with surgical fusion at L4-5 Electronically Signed   By: Ashley Royalty  M.D.   On: 05/13/2018 23:01

## 2018-05-13 NOTE — ED Notes (Signed)
Patient's daughter called and patient is up for d/c so nurse made daughter aware so she could come pick her up

## 2018-05-13 NOTE — ED Provider Notes (Signed)
[facility] Emergency Department Provider Note   ____________________________________________   First MD Initiated Contact with Patient 05/13/18 1226     (approximate)  I have reviewed the triage vital signs and the nursing notes.   HISTORY  Chief Complaint Abdominal Pain and Nausea    HPI [name] is a 84 y.o. female who complains of abdominal pain and distention.  She went to see her primary care doctor yesterday had normal vitals and normal blood work was sent home.  She reports she is not any better her whole belly hurts her sides hurt and her whole back hurts.  She is a little nauseated.  Here she has a temperature 99.  She denies any vomiting.  She had a take a laxative to get herself to stool this morning.  She did have a good stool she reports though.  Complains of her left ear draining in her right ear hurting and back pain says food is starting to get stuck in her esophagus.  She usually has a dilated with Dr. Vicente Males did not want to do it last time she saw him a while ago.  Also reports her whole body is burning under the skin and her eyes are kind of itchy  Past Medical History:  Diagnosis Date  . Anxiety and depression   . Breast cancer ([place]) 2015   LT LUMPECTOMY  . Carpal tunnel syndrome   . Chest pain    SECONDARY TO GERD  . Chronic interstitial cystitis   . Chronic right hip pain   . Depression   . Diabetes mellitus without complication ([place])   . Diverticulosis   . Dyslipidemia   . Essential hypertension, benign   . Fibromyalgia   . Frequent falls   . GERD (gastroesophageal reflux disease)   . Hearing loss of left ear   . History of fractured rib   . History of TIA (transient ischemic attack)   . Irritable bowel   . Lumbar arthropathy 10/04/2016   On CT scan, refer to ortho  . MS (multiple sclerosis) ([place])   . Osteoarthritis   . Paroxysmal supraventricular tachycardia ([place])   . Peripheral neuropathy    MILD  . Personal  history of radiation therapy 2015   BREAST CA  . Protein malnutrition ([place])   . Pure hypercholesterolemia   . Recurrent UTI   . Skin cancer   . SOB (shortness of breath)    SECONDARY TO REFLUX  . Solitary pulmonary nodule on lung CT 12/04/2015   3 mm LLL lung nodule June 2016, March 2017  . Tachycardia, paroxysmal ([facility])    Reportedly Paroxysmal Supraventricular Tachycardia, but unable to find documentation confirming this  . Thickening of esophagus 01/11/2017   Noted on CT scan June 2018  . Thyroid nodule   . Vertigo   . Vitamin B 12 deficiency     Patient Active Problem List   Diagnosis Date Noted  . DNAR (do not attempt resuscitation) 04/30/2017  . Preventative health care 04/30/2017  . Thickening of esophagus 01/11/2017  . Lumbar arthropathy 10/04/2016  . Vertigo, aural, left 07/31/2016  . Rectal pain 06/25/2016  . External hemorrhoid 06/18/2016  . Head injury due to trauma 05/09/2016  . Neoplasm of uncertain behavior of skin 05/09/2016  . Hemorrhoids 04/24/2016  . Dementia ([place]) 04/24/2016  . Medication monitoring encounter 04/03/2016  . Gait abnormality 04/03/2016  . Skin lump of leg 04/03/2016  . Pain in right knee 02/15/2016  . Pain  and swelling of right knee 02/13/2016  . Right knee sprain 02/13/2016  . Solitary pulmonary nodule on lung CT 12/04/2015  . Generalized degenerative joint disease of hand 11/21/2015  . Triggering of digit 11/21/2015  . Snapping thumb syndrome 11/21/2015  . Pain, joint, hand 11/11/2015  . Coronary artery calcification seen on CAT scan 11/11/2015  . Multiple sclerosis ([place]) 11/11/2015  . Chronic pain of multiple joints 09/02/2015  . Right lower quadrant abdominal pain 08/02/2015  . Microscopic hematuria 07/12/2015  . Recurrent UTI 04/09/2015  . Carpal tunnel syndrome 01/12/2014  . DD (diverticular disease) 01/12/2014  . Ductal carcinoma in situ of left breast 01/12/2014  . Hypercholesteremia 01/12/2014  . DS (disseminated sclerosis)  ([place]) 01/12/2014  . Arthritis, degenerative 01/12/2014  . Thyroid nodule 01/12/2014  . Cyanocobalamine deficiency (non anemic) 01/12/2014  . Anxiety and depression 01/12/2014  . Intraductal carcinoma of breast 01/12/2014  . History of right mastectomy 01/12/2014  . DM (diabetes mellitus) type II controlled, neurological manifestation ([place]) 06/26/2010  . Depression, major, recurrent, in partial remission ([place]) 06/26/2010  . Frequent falls 06/26/2010  . Stricture and stenosis of esophagus 06/26/2010  . Gastro-esophageal reflux disease without esophagitis 06/26/2010  . Fibromyalgia 06/26/2010  . Chronic interstitial cystitis 06/26/2010  . H/O malignant neoplasm of breast 06/26/2010  . Hypertension goal BP (blood pressure) < 140/90 06/01/2009  . H/O paroxysmal supraventricular tachycardia 06/01/2009  . Paroxysmal supraventricular tachycardia ([place]) 06/01/2009  . Irritable bowel syndrome with constipation and diarrhea 06/16/2008  . Diarrhea, functional 06/16/2008    Past Surgical History:  Procedure Laterality Date  . APPENDECTOMY    . AUGMENTATION MAMMAPLASTY Right 1975   BREAST BREAST ONLY  . BLADDER TACKING     X 3  . BREAST LUMPECTOMY    . BREAST SURGERY    . CARDIAC CATHETERIZATION  10/2001   Normal coronary arteries with the exception of 20% proximal D1  . CHOLECYSTECTOMY    . COLONOSCOPY WITH PROPOFOL N/A 01/21/2015   Procedure: COLONOSCOPY WITH PROPOFOL;  Surgeon: Josefine Class, MD;  Location: [facility] ENDOSCOPY;  Service: Endoscopy;  Laterality: N/A;  . ESOPHAGOGASTRODUODENOSCOPY N/A 01/21/2015   Procedure: ESOPHAGOGASTRODUODENOSCOPY (EGD);  Surgeon: Josefine Class, MD;  Location[facility] ENDOSCOPY;  Service: Endoscopy;  Laterality: N/A;  . ESOPHAGOGASTRODUODENOSCOPY (EGD) WITH PROPOFOL N/A 04/01/2017   Procedure: ESOPHAGOGASTRODUODENOSCOPY (EGD) WITH PROPOFOL WITH BANDING;  Surgeon: Jonathon Bellows, MD;  Location: [facility] ENDOSCOPY;  Service: Gastroenterology;  Laterality: N/A;    . EYE SURGERIES     WITH BUCKLE DETACHMENT OF THE RETINA  . FLEXIBLE SIGMOIDOSCOPY N/A 06/05/2017   Procedure: FLEXIBLE SIGMOIDOSCOPY WITHOUT SEDATION;  Surgeon: Jonathon Bellows, MD;  Location: [facility] ENDOSCOPY;  Service: Gastroenterology;  Laterality: N/A;  . heart monitor     Not being used... Patient can not have a MRI  . HEMORRHOID SURGERY     WITH RECONSTRUCTION  . MASTECTOMY Right 1970   SUBCUTANEOUS - NO BREAST CANCER  . TONSILLECTOMY    . TOTAL ABDOMINAL HYSTERECTOMY W/ BILATERAL SALPINGOOPHORECTOMY    . TYMPANOPLASTY      Prior to Admission medications   Medication Sig Start Date End Date Taking? Authorizing Provider  acetaminophen (TYLENOL) 500 MG tablet Take 500 mg by mouth every 8 (eight) hours as needed.     [provider]  amLODipine (NORVASC) 2.5 MG tablet TAKE 1 TABLET (2.5 MG TOTAL) BY MOUTH DAILY. 10/01/17   Arnetha Courser, MD  aspirin 81 MG tablet Take 81 mg by mouth daily.  03/07/17  [provider]  atenolol (TENORMIN) 25 MG tablet Take by mouth daily.    [provider]  atorvastatin (LIPITOR) 40 MG tablet TAKE 1 TABLET BY MOUTH EVERYDAY AT BEDTIME 02/22/18   Lada, Satira Anis, MD  b complex vitamins tablet Take 1 tablet by mouth daily.    [provider]  Carboxymethylcell-Hypromellose (GENTEAL OP) Apply 1 drop to eye. Each eye as needed    [provider]  cetirizine (ZYRTEC) 10 MG tablet Take 10 mg daily by mouth.     [provider]  Cholecalciferol (VITAMIN D-1000 MAX ST) 1000 units tablet Take 5,000 Units once a week by mouth.     [provider]  Cyanocobalamin (VITAMIN B-12) 1000 MCG SUBL daily.  09/16/15   [provider]  dicyclomine (BENTYL) 10 MG capsule Take 10 mg as needed by mouth.  01/21/15   [provider]  DULoxetine (CYMBALTA) 20 MG capsule Take 20 mg by mouth daily. 12/19/17   [provider]  DULoxetine (CYMBALTA) 30 MG capsule Take 30 mg by mouth daily. 12/19/17  12/19/18  [provider]  ferrous sulfate 325 (65 FE) MG tablet Take 325 mg daily by mouth.     [provider]  fluticasone (FLONASE) 50 MCG/ACT nasal spray SPRAY 2 SPRAYS EACH INTO BOTH NOSTRILS ONCE prn 10/19/15   [provider]  Incontinence Supply Disposable (DEPEND UNDERWEAR LARGE) MISC  06/10/17   [provider]  LACTOBACILLUS PO Take daily by mouth.     [provider]  loratadine (CLARITIN) 10 MG tablet Take 10 mg by mouth daily.    [provider]  Loratadine 10 MG CAPS Take 10 mg by mouth daily.  03/07/17   [provider]  meclizine (ANTIVERT) 25 MG tablet TAKE 1 TABLET (25 MG TOTAL) BY MOUTH EVERY 8 (EIGHT) HOURS AS NEEDED FOR DIZZINESS. 10/04/17   Lada, Satira Anis, MD  memantine (NAMENDA) 10 MG tablet Take 10 mg by mouth 2 (two) times daily.  08/11/16   [provider]  methylPREDNISolone (MEDROL DOSEPAK) 4 MG TBPK tablet TAKE 6 TABLETS ON DAY 1 AS DIRECTED ON PACKAGE AND DECREASE BY 1 TAB EACH DAY FOR A TOTAL OF 6 DAYS 06/14/17   [provider]  neomycin-polymyxin b-dexamethasone (MAXITROL) 3.5-10000-0.1 OINT PLACE SMALL RIBBON IN BOTH EYES 2 TIMES A DAY FOR 14 DAYS 02/28/17   [provider]  omeprazole (PRILOSEC) 20 MG capsule TAKE 1 CAPSULE BY MOUTH EVERY DAY 11/25/17   Jonathon Bellows, MD  ondansetron (ZOFRAN) 4 MG tablet Take 1 tablet (4 mg total) by mouth daily as needed for nausea or vomiting. Patient not taking: Reported on 04/17/2018 01/02/18 01/02/19  Delman Kitten, MD  oxyCODONE-acetaminophen (PERCOCET) 5-325 MG tablet Take 0.5 tablets by mouth every 4 (four) hours as needed for moderate pain or severe pain. 01/02/18 01/02/19  Delman Kitten, MD  pioglitazone (ACTOS) 15 MG tablet Take 1 tablet (15 mg total) by mouth daily. For diabetes 02/13/18   Arnetha Courser, MD  polyethylene glycol powder (GLYCOLAX/MIRALAX) powder 17 grams daily 04/22/17   Jonathon Bellows, MD  Probiotic Product (PROBIOTIC COLON SUPPORT) CAPS  Take 1 capsule by mouth daily.     [provider]  sertraline (ZOLOFT) 50 MG tablet TAKE 1 TABLET (50 MG TOTAL) BY MOUTH DAILY. 03/25/17   Arnetha Courser, MD  tiZANidine (ZANAFLEX) 2 MG tablet Take 2 mg every 6 (six) hours by mouth. 06/14/17   [provider]    Allergies  Aricept [donepezil hcl]; Biaxin [clarithromycin]; Copaxone [glatiramer acetate]; Decadron [dexamethasone]; Dexamethasone sodium phosphate; Diltiazem hcl; Diltiazem hcl; Flagyl [metronidazole]; Gabapentin; Iohexol; Ivp dye [iodinated diagnostic agents]; Levothyroxine; Macrobid [nitrofurantoin macrocrystal]; Sulfa antibiotics; Sulfonamide derivatives; Synthroid [levothyroxine sodium]; Copaxone  [glatiramer]; and Interferon beta-1a  Family History  Problem Relation Age of Onset  . ALS Mother   . Cancer Father        lung  . Aneurysm Brother   . Heart disease Daughter   . Colon cancer Unknown   . Breast cancer Neg Hx   . Bladder Cancer Neg Hx   . Prostate cancer Neg Hx     Social History Social History   Tobacco Use  . Smoking status: Never Smoker  . Smokeless tobacco: Never Used  . Tobacco comment: smoking cessation materials not required  Substance Use Topics  . Alcohol use: No    Alcohol/week: 0.0 standard drinks  . Drug use: No    Review of Systems  Constitutional: No fever/chills Eyes: No visual changes. ENT: No sore throat. Cardiovascular: Denies chest pain. Respiratory: Denies shortness of breath. Gastrointestinal: Diffuse abdominal pain.  nausea, no vomiting. no diarrhea.   constipation. Genitourinary: Negative for dysuria. Musculoskeletal: Negative for back pain. Skin: Negative for rash.   ____________________________________________   PHYSICAL EXAM:  VITAL SIGNS: ED Triage Vitals  Enc Vitals Group     BP 05/13/18 1203 (!) 152/76     Pulse Rate 05/13/18 1203 69     Resp 05/13/18 1203 18     Temp 05/13/18 1203 99.1 F (37.3 C)     Temp Source 05/13/18 1203 Oral      SpO2 05/13/18 1203 97 %     Weight 05/13/18 1202 160 lb (72.6 kg)     Height 05/13/18 1202 5\' 2"  (1.575 m)     Head Circumference --      Peak Flow --      Pain Score 05/13/18 1206 9     Pain Loc --      Pain Edu? --      Excl. in [place]? --     Constitutional: Alert and oriented. Well appearing and in no acute distress. Eyes: Conjunctivae are normal.  Head: Atraumatic. Nose: No congestion/rhinnorhea. Mouth/Throat: Mucous membranes are moist.  Oropharynx non-erythematous. Neck: No stridor.  Cardiovascular: Normal rate, regular rhythm. Grossly normal heart sounds.  Good peripheral circulation. Respiratory: Normal respiratory effort.  No retractions. Lungs CTAB. Gastrointestinal: Soft only diffusely tender.  Protuberant. No abdominal bruits. No CVA tenderness. Musculoskeletal: No lower extremity tenderness nor edema.   Neurologic:  Normal speech and language. No gross focal neurologic deficits are appreciated.  Skin:  Skin is warm, dry and intact. No rash noted. Psychiatric: Mood and affect are normal. Speech and behavior are normal.  ____________________________________________   LABS (all labs ordered are listed, but only abnormal results are displayed)  Labs Reviewed  COMPREHENSIVE METABOLIC PANEL - Abnormal; Notable for the following components:      Result Value   Glucose, Bld 137 (*)    AST 13 (*)    All other components within normal limits  CBC - Abnormal; Notable for the following components:   WBC 3.7 (*)    All other components within normal limits  URINALYSIS, COMPLETE (UACMP) WITH MICROSCOPIC - Abnormal; Notable for the following components:   Color, Urine YELLOW (*)    APPearance CLEAR (*)    Hgb urine dipstick SMALL (*)    All other components within normal limits  LIPASE, BLOOD  TROPONIN I  TSH  SEDIMENTATION RATE  CBC WITH DIFFERENTIAL/PLATELET  LACTIC ACID, PLASMA  FOLATE RBC  VITAMIN B12    ____________________________________________  EKG   ____________________________________________  RADIOLOGY  ED MD interpretation:   Official radiology report(s): Ct Abdomen Pelvis Wo Contrast  Result Date: 05/13/2018 CLINICAL DATA:  84 year old female with acute abdominal and pelvic pain with nausea for several days. EXAM: CT ABDOMEN AND PELVIS WITHOUT CONTRAST TECHNIQUE: Multidetector CT imaging of the abdomen and pelvis was performed following the standard protocol without IV contrast. COMPARISON:  01/02/2018 and prior CTs FINDINGS: Please note that parenchymal abnormalities may be missed without intravenous contrast. Lower chest: No acute abnormality. Hepatobiliary: No significant hepatic abnormalities identified. A central hepatic cyst is again noted. Patient is status post cholecystectomy. No biliary dilatation. Pancreas: Unremarkable Spleen: Unchanged splenic cyst again noted. Adrenals/Urinary Tract: Bilateral renal atrophy and stable indeterminate bilateral renal lesions again noted-probably cysts. Punctate nonobstructing RIGHT renal calculi again noted. The bladder and adrenal glands are unremarkable. No evidence of hydronephrosis or obstructing urinary calculi. Stomach/Bowel: Stomach is within normal limits. No evidence of bowel wall thickening, distention, or inflammatory changes. Colonic diverticulosis noted without evidence of diverticulitis. Vascular/Lymphatic: Aortic atherosclerosis. No enlarged abdominal or pelvic lymph nodes. Reproductive: Status post hysterectomy. No adnexal masses. Other: No free fluid, pneumoperitoneum or focal collection. Musculoskeletal: No acute or suspicious bony abnormalities. Multilevel degenerative changes throughout the lumbar spine noted with surgical fusion at L4-5 again noted. IMPRESSION: 1. No evidence of acute abnormality. 2. Bilateral renal atrophy, punctate nonobstructing RIGHT renal calculi and stable probable bilateral renal cysts. 3.  Aortic  Atherosclerosis (ICD10-I70.0). Electronically Signed   By: Margarette Canada M.D.   On: 05/13/2018 13:41    ____________________________________________   PROCEDURES  Procedure(s) performed:   Procedures  Critical Care performed:   ____________________________________________   INITIAL IMPRESSION / ASSESSMENT AND PLAN / ED COURSE  ----------------------------------------- 5:10 PM on 05/13/2018 ----------------------------------------- Continues to complain of abdominal pain aching all over.  Worse with palpation and percussion.  Discussed the patient with the  general surgeon.  General surgeon asks for lactic acid.  I am concerned this could be ischemic bowel.  I will also get a CT angio of the belly    Clinical Course as of May 13 2233  Tue May 13, 2018  1331 Sedimentation rate [PM]  1331 Sedimentation rate [PM]  1331 Sedimentation rate [PM]  2025 MCH: 31.1 [PM]    Clinical Course User Index [PM] Nena Polio, MD   ----------------------------------------- 10:33 PM on 05/13/2018 -----------------------------------------  Signed out to Dr. Eula Listen pending CT angios results.  Patient had to be given medication for contrast allergy  ____________________________________________   FINAL CLINICAL IMPRESSION(S) / ED DIAGNOSES  Final diagnoses:  Abdominal pain, unspecified abdominal location     ED Discharge Orders    None       Note:  This document was prepared using Dragon voice recognition software and may include unintentional dictation errors.    Nena Polio, MD 05/13/18 2234

## 2018-05-13 NOTE — ED Provider Notes (Signed)
CTA neg.  Tolerating orals.  Stable for dc.   Darel Hong, MD 05/13/18 2318

## 2018-05-13 NOTE — ED Notes (Signed)
Report given to Sonjia, RN 

## 2018-05-13 NOTE — ED Triage Notes (Signed)
Pt reports that she went to her PMD yesterday for her abd pain and nausea, they did not find anything. She reports that she is still having the Bilat abd pain with nausea.

## 2018-05-14 ENCOUNTER — Ambulatory Visit: Payer: Self-pay | Admitting: Family Medicine

## 2018-05-14 LAB — FOLATE RBC
FOLATE, HEMOLYSATE: 612 ng/mL
Folate, RBC: 1526 ng/mL (ref >498)
Hematocrit: 40.1 % (ref 34.0–46.6)

## 2018-05-14 LAB — VITAMIN B12: VITAMIN B 12: 1960 pg/mL — AB (ref 180–914)

## 2018-05-14 NOTE — Telephone Encounter (Signed)
Provided lab results to Daughter who is on DPR of Raelyn Ensign on 05/13/18.  Daughter voiced understanding.

## 2018-05-15 ENCOUNTER — Telehealth: Payer: Self-pay

## 2018-05-15 NOTE — Telephone Encounter (Signed)
erro  neous encounter

## 2018-05-16 ENCOUNTER — Ambulatory Visit (INDEPENDENT_AMBULATORY_CARE_PROVIDER_SITE_OTHER): Payer: Medicare HMO | Admitting: Family Medicine

## 2018-05-16 ENCOUNTER — Encounter: Payer: Self-pay | Admitting: Family Medicine

## 2018-05-16 VITALS — BP 128/70 | HR 94 | Temp 98.4°F | Ht 62.0 in | Wt 156.2 lb

## 2018-05-16 DIAGNOSIS — F0390 Unspecified dementia without behavioral disturbance: Secondary | ICD-10-CM | POA: Diagnosis not present

## 2018-05-16 DIAGNOSIS — I6350 Cerebral infarction due to unspecified occlusion or stenosis of unspecified cerebral artery: Secondary | ICD-10-CM | POA: Diagnosis not present

## 2018-05-16 DIAGNOSIS — K5790 Diverticulosis of intestine, part unspecified, without perforation or abscess without bleeding: Secondary | ICD-10-CM | POA: Diagnosis not present

## 2018-05-16 DIAGNOSIS — M4724 Other spondylosis with radiculopathy, thoracic region: Secondary | ICD-10-CM

## 2018-05-16 DIAGNOSIS — K219 Gastro-esophageal reflux disease without esophagitis: Secondary | ICD-10-CM | POA: Diagnosis not present

## 2018-05-16 DIAGNOSIS — G8929 Other chronic pain: Secondary | ICD-10-CM

## 2018-05-16 DIAGNOSIS — Z923 Personal history of irradiation: Secondary | ICD-10-CM

## 2018-05-16 DIAGNOSIS — R1031 Right lower quadrant pain: Secondary | ICD-10-CM

## 2018-05-16 DIAGNOSIS — M47814 Spondylosis without myelopathy or radiculopathy, thoracic region: Secondary | ICD-10-CM | POA: Insufficient documentation

## 2018-05-16 DIAGNOSIS — R42 Dizziness and giddiness: Secondary | ICD-10-CM | POA: Diagnosis not present

## 2018-05-16 DIAGNOSIS — K222 Esophageal obstruction: Secondary | ICD-10-CM | POA: Diagnosis not present

## 2018-05-16 DIAGNOSIS — E119 Type 2 diabetes mellitus without complications: Secondary | ICD-10-CM | POA: Diagnosis not present

## 2018-05-16 DIAGNOSIS — R1032 Left lower quadrant pain: Secondary | ICD-10-CM

## 2018-05-16 DIAGNOSIS — H6690 Otitis media, unspecified, unspecified ear: Secondary | ICD-10-CM | POA: Diagnosis not present

## 2018-05-16 DIAGNOSIS — J029 Acute pharyngitis, unspecified: Secondary | ICD-10-CM | POA: Diagnosis not present

## 2018-05-16 DIAGNOSIS — I10 Essential (primary) hypertension: Secondary | ICD-10-CM | POA: Diagnosis not present

## 2018-05-16 DIAGNOSIS — E079 Disorder of thyroid, unspecified: Secondary | ICD-10-CM | POA: Diagnosis not present

## 2018-05-16 NOTE — Assessment & Plan Note (Signed)
Patient is scheduled to see GI for EGD and is on cancellation list per daughter; advised to avoid difficult to chew/swallow foods like meat and hard bread; stick with soft foods

## 2018-05-16 NOTE — Patient Instructions (Addendum)
Try using the heating pad low heat and only for 15 minutes over the stomach; use a protective cloth between the skin and the heat Take tylenol per the package directions Maximum tylenol is 3,000 mg per day If you have 500 mg of tylenol (acetaminophen), you can take six total per 24 hours; do not take more than that Talk to your neurologist about the Namenda and see if there is something else you can take, because the Namenda MAY be causing your abdominal pain and/or nausea Talk to the orthopaedist at [facility] about your abdominal pain because it may be from the nerves in your thoracic spine Do be cautious about additional CT scans; cumulative radiation adds up over time

## 2018-05-16 NOTE — Assessment & Plan Note (Signed)
I talked with the patient and her daughter about how much radiation she has had in just the last two years; explained that if it is serious and the doctor in the ER absolutely thinks it is mandatory, I leave that to his/her discretion; however, I want the patient to remind doctors if she has just had a scan recently, work with them and not demand a scan if they don't think it is necessary; just want he to be aware that every time she get a CT scan and cumulative radiation, she may increase her risk for cancer

## 2018-05-16 NOTE — Assessment & Plan Note (Signed)
I reviewed the patient's labs and two CT scans with her and her daughter; she has a positive Carnett's sign; she has arthritis in her thoracic and upper lumbar spine; she has GI issues; there are no tumors or masses or cancer noted on this scan, I explained; similarly, there is no evidence to suggest mesenteric ischemia, vasculitis, or other vascular cause for her pain; patient/daughter to contact neurologist about Namenda as a possible etiology, and also contact back or pain doctor to see if thoracic/upper lumbar arthritis/radiculopathy could be causing her pain; in the meantime, tylenol per package directions, gentle heat on low for short periods on the abdomen; caution about additional CT scans

## 2018-05-16 NOTE — Assessment & Plan Note (Signed)
No evidence of diverticulitis on recent CT scan

## 2018-05-16 NOTE — Assessment & Plan Note (Signed)
Reviewed previous thoracic spine imaging; explained that another possibility for her abdominal pain could be related to the arthritis in her back; she will contact her orthopaedist or pain doctor to see if this could be the cause

## 2018-05-16 NOTE — Progress Notes (Signed)
BP 128/70   Pulse 94   Temp 98.4 F (36.9 C) (Oral)   Ht 5\' 2"  (1.575 m)   Wt 156 lb 3.2 oz (70.9 kg)   SpO2 98%   BMI 28.57 kg/m    Subjective:    Patient ID: [name], female    DOB: [birth year], 84 y.o.   MRN: [id number]  HPI: [name] is a 84 y.o. female  Chief Complaint  Patient presents with  . Follow-up    HPI  She was in the ER earlier this week; she had really bad abdominal pain; feels like she wants to throw up; feels like she was choking on something and it came out, maybe a grape; looked like it had blood on it; she has an appt with GI doctor to have EGD and may need esophagus stretched; she knows to not eat hard grapes, just soft foods; this started last Thursday  She has pain in her back, has arthritis in her back; gets epidural shots in her back; reviewed 2016 thoracic xray; osteophytes along thoracic and upper lumber spine  In the ER on May 13, 2018; note reviewed; taken by ambulance; could not drive, and stomach really hurting; she has a CT scan first without contrast, then had another CT scan with contrast  First CT scan: CLINICAL DATA:  84 year old female with acute abdominal and pelvic pain with nausea for several days.  EXAM: CT ABDOMEN AND PELVIS WITHOUT CONTRAST  TECHNIQUE: Multidetector CT imaging of the abdomen and pelvis was performed following the standard protocol without IV contrast.  COMPARISON:  01/02/2018 and prior CTs  FINDINGS: Please note that parenchymal abnormalities may be missed without intravenous contrast.  Lower chest: No acute abnormality.  Hepatobiliary: No significant hepatic abnormalities identified. A central hepatic cyst is again noted. Patient is status post cholecystectomy. No biliary dilatation.  Pancreas: Unremarkable  Spleen: Unchanged splenic cyst again noted.  Adrenals/Urinary Tract: Bilateral renal atrophy and stable indeterminate bilateral renal lesions again noted-probably  cysts. Punctate nonobstructing RIGHT renal calculi again noted. The bladder and adrenal glands are unremarkable. No evidence of hydronephrosis or obstructing urinary calculi.  Stomach/Bowel: Stomach is within normal limits. No evidence of bowel wall thickening, distention, or inflammatory changes. Colonic diverticulosis noted without evidence of diverticulitis.  Vascular/Lymphatic: Aortic atherosclerosis. No enlarged abdominal or pelvic lymph nodes.  Reproductive: Status post hysterectomy. No adnexal masses.  Other: No free fluid, pneumoperitoneum or focal collection.  Musculoskeletal: No acute or suspicious bony abnormalities. Multilevel degenerative changes throughout the lumbar spine noted with surgical fusion at L4-5 again noted.  IMPRESSION: 1. No evidence of acute abnormality. 2. Bilateral renal atrophy, punctate nonobstructing RIGHT renal calculi and stable probable bilateral renal cysts. 3.  Aortic Atherosclerosis (ICD10-I70.0).   Electronically Signed   By: Margarette Canada M.D.   On: 05/13/2018 13:41   CT angio CLINICAL DATA:  Generalized abdominal pain and nausea.  EXAM: CTA ABDOMEN AND PELVIS WITHOUT AND WITH CONTRAST  TECHNIQUE: Multidetector CT imaging of the abdomen and pelvis was performed using the standard protocol during bolus administration of intravenous contrast. Multiplanar reconstructed images and MIPs were obtained and reviewed to evaluate the vascular anatomy.  CONTRAST:  141mL ISOVUE-370 IOPAMIDOL (ISOVUE-370) INJECTION 76% after premedication x4 hours. No immediate adverse effect after contrast administration.  COMPARISON:  Noncontrast study from 05/13/2018  FINDINGS: VASCULAR  Aorta: The abdominal aorta is not aneurysmal. No dissection is identified. Minimal atherosclerosis is noted of the abdominal aorta.  Celiac: Patent without stenosis, aneurysm  or dissection.  SMA: Well opacified without occlusion, stenosis or  aneurysm. No dissection.  Renals: Both renal arteries are patent without evidence of aneurysm, dissection, vasculitis, fibromuscular dysplasia or significant stenosis.  IMA: Patent without evidence of aneurysm, dissection, vasculitis or significant stenosis.  Inflow: Patent without evidence of aneurysm, dissection, vasculitis or significant stenosis.  Proximal Outflow: Bilateral common femoral and visualized portions of the superficial and profunda femoral arteries are patent without evidence of aneurysm, dissection, vasculitis or significant stenosis.  Veins: Patent portal and splenic veins. IVC is unremarkable without occlusion or thrombus.  Review of the MIP images confirms the above findings.  NON-VASCULAR  Lower chest: Normal heart size.  Clear lung bases.  Hepatobiliary: Small hypodensities scattered throughout the liver compatible with cysts, most of which are too small to characterize. Mild intrahepatic ductal dilatation on the left may simply reflect reservoir effect status post cholecystectomy.  Pancreas: Normal  Spleen: 3.8 cm cyst of the spleen unchanged.  Adrenals/Urinary Tract: Normal bilateral adrenal glands. Bilateral renal cysts and mild areas of cortical thinning. No obstructive uropathy. Previously noted punctate right-sided renal calculi are obscured by contrast. The urinary bladder is unremarkable for the degree of distention.  Stomach/Bowel: There is colonic diverticulosis without acute diverticulitis. Status post appendectomy. No wall thickening, distention or inflammation. The stomach is within normal limits.  Lymphatic: No adenopathy  Reproductive: Hysterectomy.  No adnexal mass.  Other: No free air nor free fluid.  Musculoskeletal: Lumbar spondylosis with surgical fusion at L4-5  IMPRESSION: VASCULAR  Nonaneurysmal abdominal aorta with patent branch vessels. No findings of mesenteric  ischemia.  NON-VASCULAR  1. Renal atrophy with stable bilateral renal cysts. 2. 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 3. Status post cholecystectomy. Tiny hypodensities in the liver compatible with cysts but too small to characterize. 4. Lumbar spondylosis with surgical fusion at L4-5   Electronically Signed   By: Ashley Royalty M.D.   On: 05/13/2018 23:01  Eyes are running water, ears are blocked with wax  She has bentyl at home, but just on the counter and not taking it  Depression screen [facility] 2/9 05/16/2018 05/12/2018 04/17/2018 02/11/2018 01/02/2018  Decreased Interest 0 0 1 0 0  Down, Depressed, Hopeless 1 0 1 1 1   PHQ - 2 Score 1 0 2 1 1   Altered sleeping 1 0 1 1 1   Tired, decreased energy 1 0 2 2 2   Change in appetite 0 0 2 1 1   Feeling bad or failure about yourself  1 0 1 0 0  Trouble concentrating 1 0 1 0 2  Moving slowly or fidgety/restless 0 0 0 2 -  Suicidal thoughts 0 0 0 0 0  PHQ-9 Score 5 0 9 7 7   Difficult doing work/chores Somewhat difficult Not difficult at all Somewhat difficult Not difficult at all Somewhat difficult  Some recent data might be hidden   Fall Risk  05/16/2018 05/12/2018 04/17/2018 01/02/2018 06/17/2017  Falls in the past year? Yes Yes Yes No No  Comment - - weakness led to falling on floor - -  Number falls in past yr: 2 or more 2 or more 2 or more - -  Injury with Fall? Yes No No - -  Comment - - - - -  Risk Factor Category  - High Fall Risk High Fall Risk - -  Risk for fall due to : - - Medication side effect;Impaired balance/gait;Impaired vision;History of fall(s) - -  Risk for fall due to: Comment - - wears  eyeglasses; ambulates with rolling walker - -  Follow up - Falls evaluation completed;Falls prevention discussed Falls evaluation completed;Education provided;Falls prevention discussed - -  Comment - - - - -    Relevant past medical, surgical, family and social history reviewed Past Medical History:  Diagnosis Date  . Anxiety and  depression   . Breast cancer ([place]) 2015   LT LUMPECTOMY  . Carpal tunnel syndrome   . Chest pain    SECONDARY TO GERD  . Chronic interstitial cystitis   . Chronic right hip pain   . Depression   . Diabetes mellitus without complication ([place])   . Diverticulosis   . Dyslipidemia   . Essential hypertension, benign   . Fibromyalgia   . Frequent falls   . GERD (gastroesophageal reflux disease)   . Hearing loss of left ear   . History of fractured rib   . History of TIA (transient ischemic attack)   . Irritable bowel   . Lumbar arthropathy 10/04/2016   On CT scan, refer to ortho  . MS (multiple sclerosis) ([place])   . Osteoarthritis   . Paroxysmal supraventricular tachycardia ([place])   . Peripheral neuropathy    MILD  . Personal history of radiation therapy 2015   BREAST CA  . Protein malnutrition ([place])   . Pure hypercholesterolemia   . Recurrent UTI   . Skin cancer   . SOB (shortness of breath)    SECONDARY TO REFLUX  . Solitary pulmonary nodule on lung CT 12/04/2015   3 mm LLL lung nodule June 2016, March 2017  . Tachycardia, paroxysmal ([facility])    Reportedly Paroxysmal Supraventricular Tachycardia, but unable to find documentation confirming this  . Thickening of esophagus 01/11/2017   Noted on CT scan June 2018  . Thyroid nodule   . Vertigo   . Vitamin B 12 deficiency    Past Surgical History:  Procedure Laterality Date  . APPENDECTOMY    . AUGMENTATION MAMMAPLASTY Right 1975   BREAST BREAST ONLY  . BLADDER TACKING     X 3  . BREAST LUMPECTOMY    . BREAST SURGERY    . CARDIAC CATHETERIZATION  10/2001   Normal coronary arteries with the exception of 20% proximal D1  . CHOLECYSTECTOMY    . COLONOSCOPY WITH PROPOFOL N/A 01/21/2015   Procedure: COLONOSCOPY WITH PROPOFOL;  Surgeon: Josefine Class, MD;  Location: [facility] ENDOSCOPY;  Service: Endoscopy;  Laterality: N/A;  . ESOPHAGOGASTRODUODENOSCOPY N/A 01/21/2015   Procedure: ESOPHAGOGASTRODUODENOSCOPY (EGD);  Surgeon: Josefine Class, MD;  Location: [facility] ENDOSCOPY;  Service: Endoscopy;  Laterality: N/A;  . ESOPHAGOGASTRODUODENOSCOPY (EGD) WITH PROPOFOL N/A 04/01/2017   Procedure: ESOPHAGOGASTRODUODENOSCOPY (EGD) WITH PROPOFOL WITH BANDING;  Surgeon: Jonathon Bellows, MD;  Location: [facility] ENDOSCOPY;  Service: Gastroenterology;  Laterality: N/A;  . EYE SURGERIES     WITH BUCKLE DETACHMENT OF THE RETINA  . FLEXIBLE SIGMOIDOSCOPY N/A 06/05/2017   Procedure: FLEXIBLE SIGMOIDOSCOPY WITHOUT SEDATION;  Surgeon: Jonathon Bellows, MD;  Location: [facility] ENDOSCOPY;  Service: Gastroenterology;  Laterality: N/A;  . heart monitor     Not being used... Patient can not have a MRI  . HEMORRHOID SURGERY     WITH RECONSTRUCTION  . MASTECTOMY Right 1970   SUBCUTANEOUS - NO BREAST CANCER  . TONSILLECTOMY    . TOTAL ABDOMINAL HYSTERECTOMY W/ BILATERAL SALPINGOOPHORECTOMY    . TYMPANOPLASTY     Family History  Problem Relation Age of Onset  . ALS Mother   . Cancer Father  lung  . Aneurysm Brother   . Heart disease Daughter   . Colon cancer Unknown   . Breast cancer Neg Hx   . Bladder Cancer Neg Hx   . Prostate cancer Neg Hx    Social History   Tobacco Use  . Smoking status: Never Smoker  . Smokeless tobacco: Never Used  . Tobacco comment: smoking cessation materials not required  Substance Use Topics  . Alcohol use: No    Alcohol/week: 0.0 standard drinks  . Drug use: No     [facility] Visit from 05/16/2018 in [facility]  AUDIT-C Score  0      Interim medical history since last visit reviewed. Allergies and medications reviewed  Review of Systems Per HPI unless specifically indicated above     Objective:    BP 128/70   Pulse 94   Temp 98.4 F (36.9 C) (Oral)   Ht 5\' 2"  (1.575 m)   Wt 156 lb 3.2 oz (70.9 kg)   SpO2 98%   BMI 28.57 kg/m   Wt Readings from Last 3 Encounters:  05/16/18 156 lb 3.2 oz (70.9 kg)  05/13/18 160 lb (72.6 kg)  05/12/18 160 lb 12.8 oz (72.9 kg)    Physical  Exam  Constitutional: She appears well-developed and well-nourished. No distress.  HENT:  Head: Normocephalic and atraumatic.  Eyes: EOM are normal. No scleral icterus.  Neck: No thyromegaly present.  Cardiovascular: Normal rate, regular rhythm and normal heart sounds.  No murmur heard. Pulmonary/Chest: Effort normal and breath sounds normal. No respiratory distress. She has no wheezes.  Abdominal: Soft. Bowel sounds are normal. She exhibits no distension.  Patient initially hollered out in pain with light touch on the left side abdomen during palpation; however, similar depth palpation in that same spot while talking did not cause the same response; positive Carnett's sign both sides abdomen around T9-T12 dermatomes  Musculoskeletal: She exhibits no edema.  Neurological: She is alert.  Skin: Skin is warm and dry. She is not diaphoretic. No pallor.  Psychiatric: She has a normal mood and affect. Her behavior is normal. Judgment and thought content normal.    Results for orders placed or performed during the hospital encounter of 05/13/18  Lipase, blood  Result Value Ref Range   Lipase 22 11 - 51 U/L  Comprehensive metabolic panel  Result Value Ref Range   Sodium 141 135 - 145 mmol/L   Potassium 4.2 3.5 - 5.1 mmol/L   Chloride 106 98 - 111 mmol/L   CO2 29 22 - 32 mmol/L   Glucose, Bld 137 (H) 70 - 99 mg/dL   BUN 17 8 - 23 mg/dL   Creatinine, Ser 0.84 0.44 - 1.00 mg/dL   Calcium 9.4 8.9 - 10.3 mg/dL   Total Protein 6.9 6.5 - 8.1 g/dL   Albumin 4.0 3.5 - 5.0 g/dL   AST 13 (L) 15 - 41 U/L   ALT 20 0 - 44 U/L   Alkaline Phosphatase 45 38 - 126 U/L   Total Bilirubin 0.7 0.3 - 1.2 mg/dL   GFR calc non Af Amer >60 >60 mL/min   GFR calc Af Amer >60 >60 mL/min   Anion gap 6 5 - 15  CBC  Result Value Ref Range   WBC 3.7 (L) 4.0 - 10.5 K/uL   RBC 4.35 3.87 - 5.11 MIL/uL   Hemoglobin 13.7 12.0 - 15.0 g/dL   HCT 41.1 36.0 - 46.0 %   MCV 94.5 80.0 -  100.0 fL   MCH 31.5 26.0 - 34.0 pg    MCHC 33.3 30.0 - 36.0 g/dL   RDW 14.1 11.5 - 15.5 %   Platelets 178 150 - 400 K/uL   nRBC 0.0 0.0 - 0.2 %  Urinalysis, Complete w Microscopic  Result Value Ref Range   Color, Urine YELLOW (A) YELLOW   APPearance CLEAR (A) CLEAR   Specific Gravity, Urine 1.011 1.005 - 1.030   pH 5.0 5.0 - 8.0   Glucose, UA NEGATIVE NEGATIVE mg/dL   Hgb urine dipstick SMALL (A) NEGATIVE   Bilirubin Urine NEGATIVE NEGATIVE   Ketones, ur NEGATIVE NEGATIVE mg/dL   Protein, ur NEGATIVE NEGATIVE mg/dL   Nitrite NEGATIVE NEGATIVE   Leukocytes, UA NEGATIVE NEGATIVE   RBC / HPF 0-5 0 - 5 RBC/hpf   WBC, UA 0-5 0 - 5 WBC/hpf   Bacteria, UA NONE SEEN NONE SEEN   Squamous Epithelial / LPF 0-5 0 - 5  Troponin I  Result Value Ref Range   Troponin I <0.03 <0.03 ng/mL  Folate RBC  Result Value Ref Range   Folate, Hemolysate 612.0 Not Estab. ng/mL   Hematocrit 40.1 34.0 - 46.6 %   Folate, RBC 1,526 >498 ng/mL  Vitamin B12  Result Value Ref Range   Vitamin B-12 1,960 (H) 180 - 914 pg/mL  TSH  Result Value Ref Range   TSH 2.742 0.350 - 4.500 uIU/mL  Sedimentation rate  Result Value Ref Range   Sed Rate 6 0 - 30 mm/hr  CBC with Differential  Result Value Ref Range   WBC 4.1 4.0 - 10.5 K/uL   RBC 4.11 3.87 - 5.11 MIL/uL   Hemoglobin 12.8 12.0 - 15.0 g/dL   HCT 38.8 36.0 - 46.0 %   MCV 94.4 80.0 - 100.0 fL   MCH 31.1 26.0 - 34.0 pg   MCHC 33.0 30.0 - 36.0 g/dL   RDW 14.0 11.5 - 15.5 %   Platelets 161 150 - 400 K/uL   nRBC 0.0 0.0 - 0.2 %   Neutrophils Relative % 55 %   Neutro Abs 2.2 1.7 - 7.7 K/uL   Lymphocytes Relative 35 %   Lymphs Abs 1.4 0.7 - 4.0 K/uL   Monocytes Relative 9 %   Monocytes Absolute 0.4 0.1 - 1.0 K/uL   Eosinophils Relative 1 %   Eosinophils Absolute 0.1 0.0 - 0.5 K/uL   Basophils Relative 0 %   Basophils Absolute 0.0 0.0 - 0.1 K/uL   Immature Granulocytes 0 %   Abs Immature Granulocytes 0.01 0.00 - 0.07 K/uL  Lactic acid, plasma  Result Value Ref Range   Lactic Acid,  Venous 0.8 0.5 - 1.9 mmol/L      Assessment & Plan:   Problem List Items Addressed This Visit      Digestive   Stricture and stenosis of esophagus    Patient is scheduled to see GI for EGD and is on cancellation list per daughter; advised to avoid difficult to chew/swallow foods like meat and hard bread; stick with soft foods      DD (diverticular disease)    No evidence of diverticulitis on recent CT scan        Nervous and Auditory   Dementia [facility])    Encouraged patient/daughter to contact the neurologist, as the Namenda may be wholly or partly responsible for abdominal pain and/or nausea; they will contact him        Musculoskeletal and Integument   Degenerative arthritis of  thoracic spine    Reviewed previous thoracic spine imaging; explained that another possibility for her abdominal pain could be related to the arthritis in her back; she will contact her orthopaedist or pain doctor to see if this could be the cause        Other   History of radiation exposure    I talked with the patient and her daughter about how much radiation she has had in just the last two years; explained that if it is serious and the doctor in the ER absolutely thinks it is mandatory, I leave that to his/her discretion; however, I want the patient to remind doctors if she has just had a scan recently, work with them and not demand a scan if they don't think it is necessary; just want he to be aware that every time she get a CT scan and cumulative radiation, she may increase her risk for cancer      Chronic bilateral lower abdominal pain - Primary    I reviewed the patient's labs and two CT scans with her and her daughter; she has a positive Carnett's sign; she has arthritis in her thoracic and upper lumbar spine; she has GI issues; there are no tumors or masses or cancer noted on this scan, I explained; similarly, there is no evidence to suggest mesenteric ischemia, vasculitis, or other vascular cause  for her pain; patient/daughter to contact neurologist about Namenda as a possible etiology, and also contact back or pain doctor to see if thoracic/upper lumbar arthritis/radiculopathy could be causing her pain; in the meantime, tylenol per package directions, gentle heat on low for short periods on the abdomen; caution about additional CT scans          Follow up plan: No follow-ups on file.  An after-visit summary was printed and given to the patient at [facility].  Please see the patient instructions which may contain other information and recommendations beyond what is mentioned above in the assessment and plan.

## 2018-05-16 NOTE — Assessment & Plan Note (Signed)
Encouraged patient/daughter to contact the neurologist, as the Namenda may be wholly or partly responsible for abdominal pain and/or nausea; they will contact him

## 2018-05-22 ENCOUNTER — Other Ambulatory Visit: Payer: Self-pay | Admitting: Gastroenterology

## 2018-05-23 ENCOUNTER — Telehealth: Payer: Self-pay | Admitting: Family Medicine

## 2018-05-23 DIAGNOSIS — Z1239 Encounter for other screening for malignant neoplasm of breast: Secondary | ICD-10-CM

## 2018-05-23 NOTE — Telephone Encounter (Signed)
Pt needs a order for her mammogram to be sent to [facility]

## 2018-05-26 ENCOUNTER — Other Ambulatory Visit: Payer: Self-pay | Admitting: Family Medicine

## 2018-05-26 DIAGNOSIS — Z853 Personal history of malignant neoplasm of breast: Secondary | ICD-10-CM

## 2018-05-30 ENCOUNTER — Other Ambulatory Visit: Payer: Self-pay | Admitting: Family Medicine

## 2018-06-02 ENCOUNTER — Other Ambulatory Visit: Payer: Self-pay | Admitting: Physician Assistant

## 2018-06-02 DIAGNOSIS — H6690 Otitis media, unspecified, unspecified ear: Secondary | ICD-10-CM | POA: Diagnosis not present

## 2018-06-02 DIAGNOSIS — R221 Localized swelling, mass and lump, neck: Secondary | ICD-10-CM

## 2018-06-02 DIAGNOSIS — H6980 Other specified disorders of Eustachian tube, unspecified ear: Secondary | ICD-10-CM | POA: Diagnosis not present

## 2018-06-02 DIAGNOSIS — R42 Dizziness and giddiness: Secondary | ICD-10-CM | POA: Diagnosis not present

## 2018-06-02 DIAGNOSIS — J029 Acute pharyngitis, unspecified: Secondary | ICD-10-CM | POA: Diagnosis not present

## 2018-06-05 IMAGING — CT CT CHEST W/O CM
3 of 7 series · 16 of 36 positions shown, 18 images · non-contrast
Comparison: 10/23/2015, 01/09/2015

CLINICAL DATA: Follow-up pulmonary nodule

EXAM:
CT CHEST WITHOUT CONTRAST
TECHNIQUE: Multidetector CT imaging of the chest was performed following the
standard protocol without IV contrast.

[Series 3: thorax · axial · 0.62mm/px · z∈[-777,-559]mm · 8 of 141 slices shown, 10 images]
[im 16/141  mediastinal]
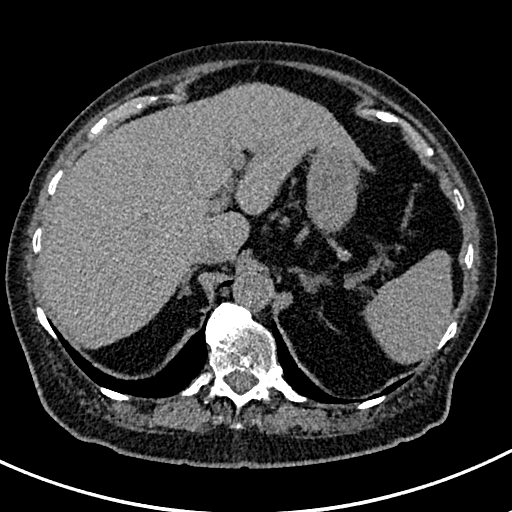
[im 16/141  bone]
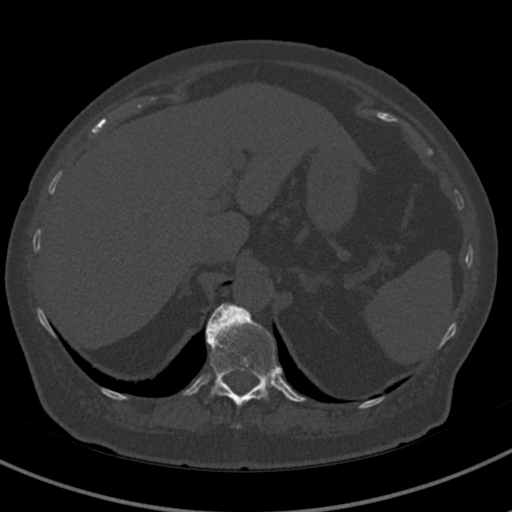
[im 32/141  bone]
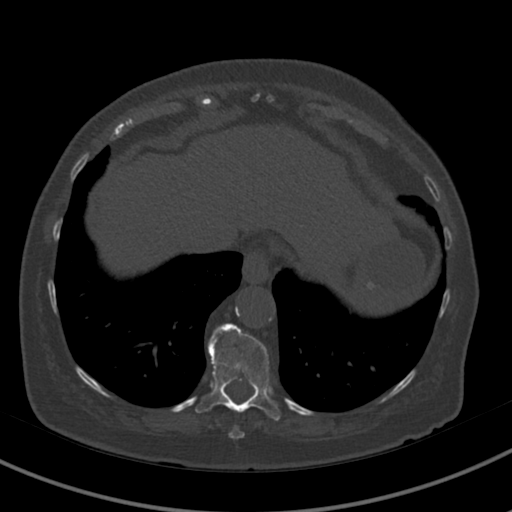
[im 47/141  bone]
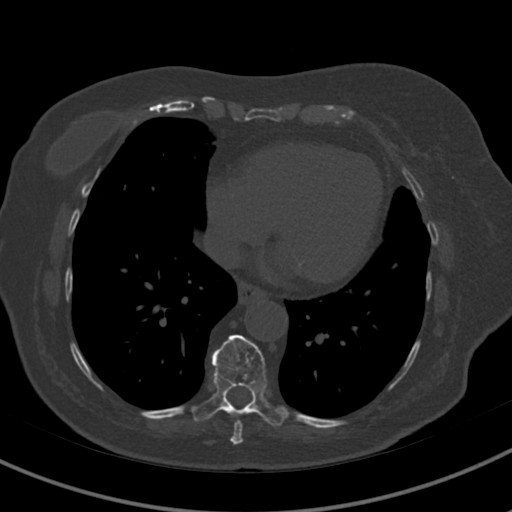
[im 63/141  bone]
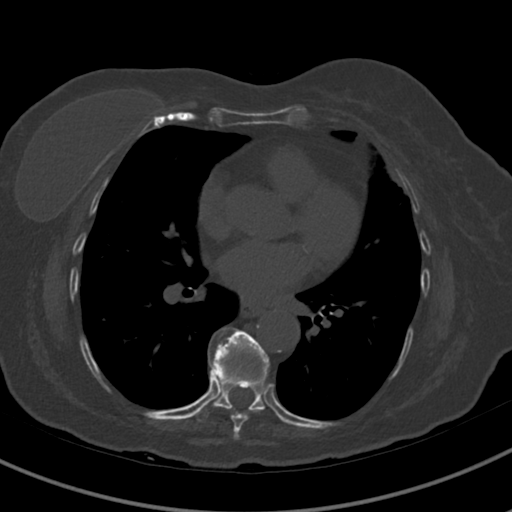
[im 78/141  mediastinal]
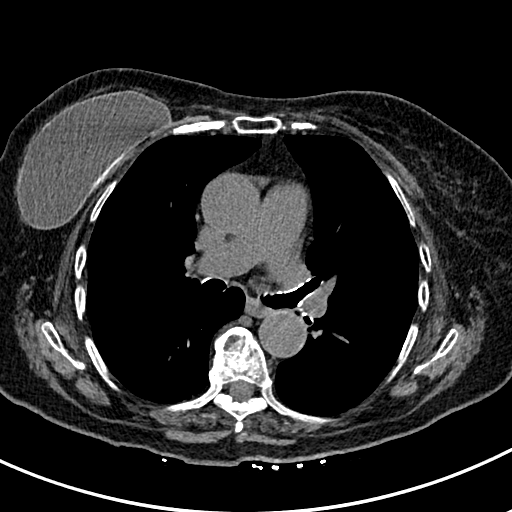
[im 78/141  bone]
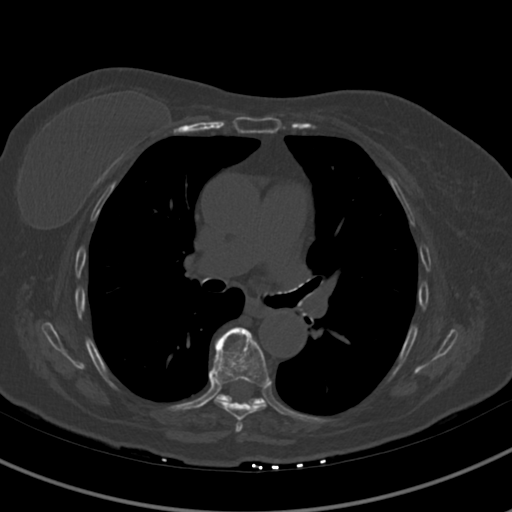
[im 94/141  bone]
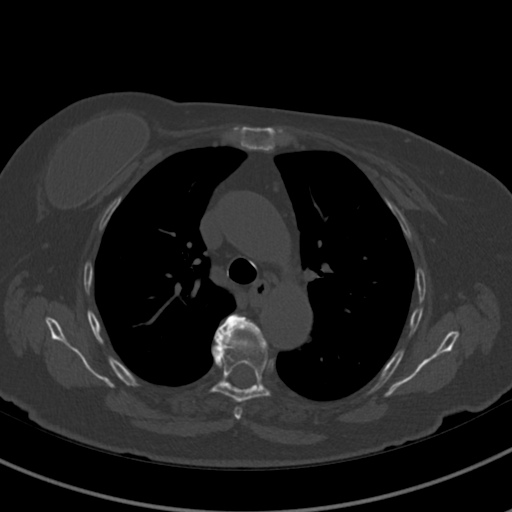
[im 109/141  bone]
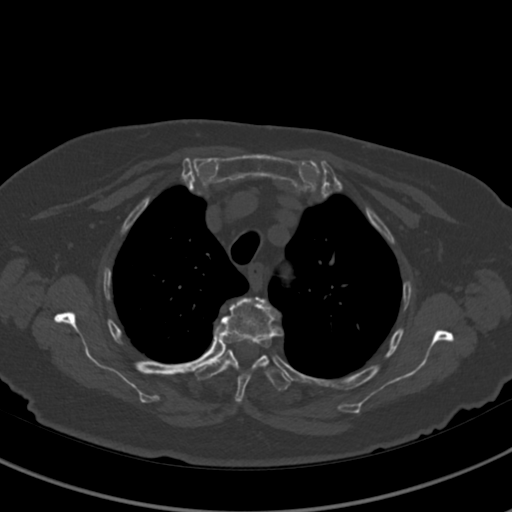
[im 125/141  bone]
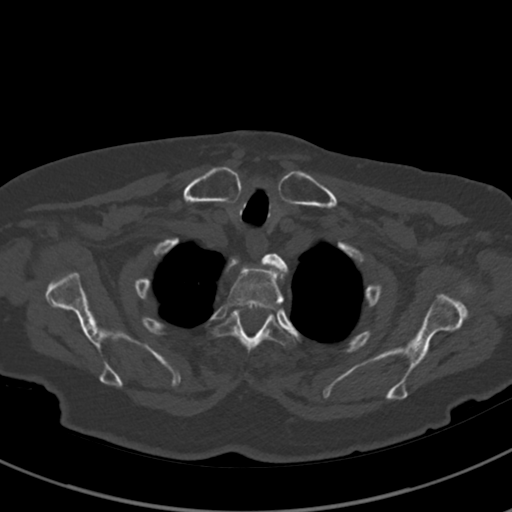

[Series 6: coronal · coronal · 0.59mm/px · 2 of 139 slices shown]
[im 47/139  bone]
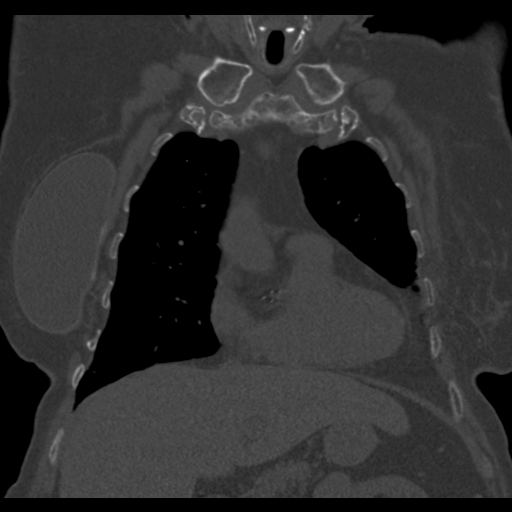
[im 93/139  bone]
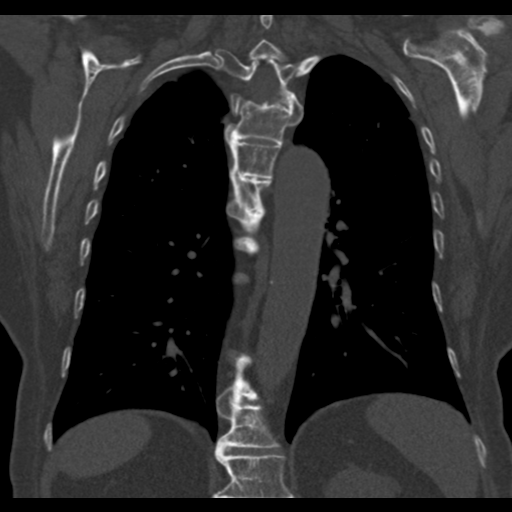

[Series 14: multi disc · axial · 0.21mm/px · z∈[-929,-766]mm · 6 of 99 slices shown]
[im 15/99  bone]
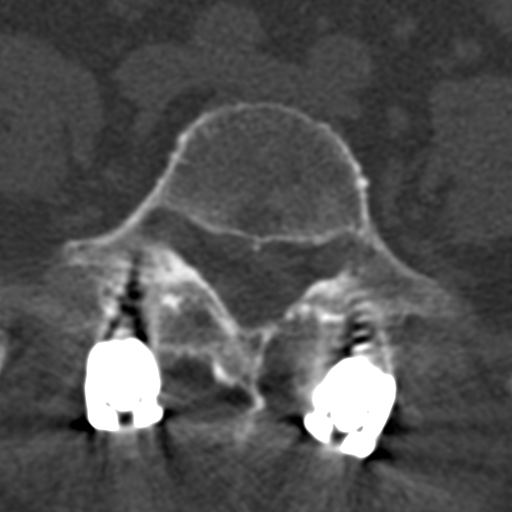
[im 29/99  bone]
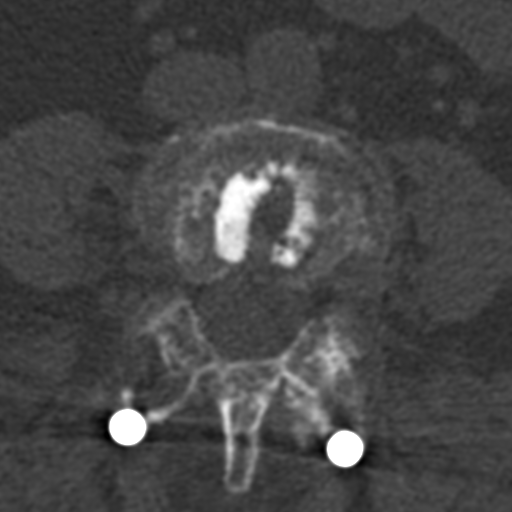
[im 43/99  bone]
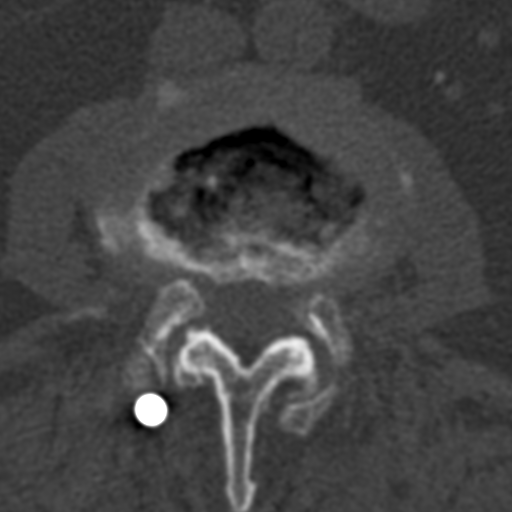
[im 57/99  bone]
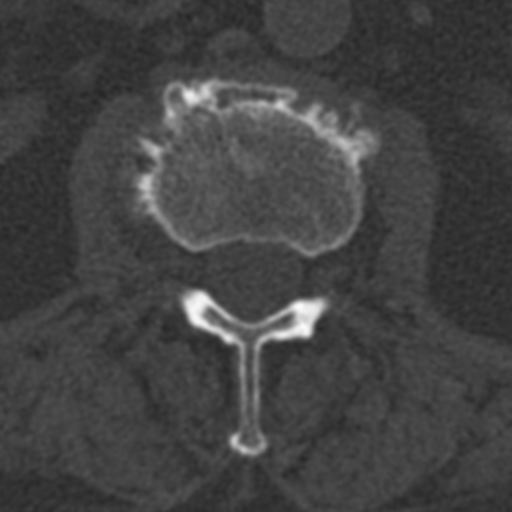
[im 71/99  bone]
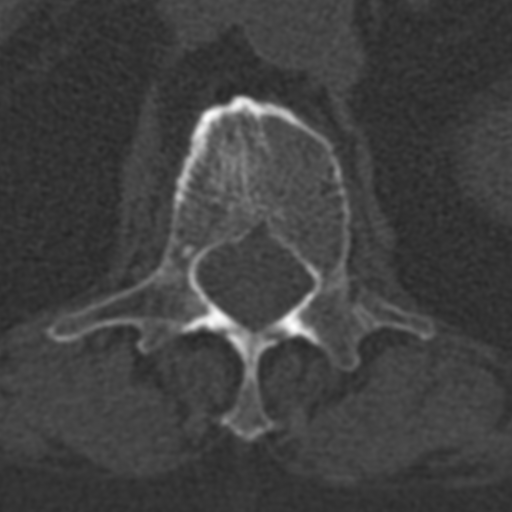
[im 85/99  bone]
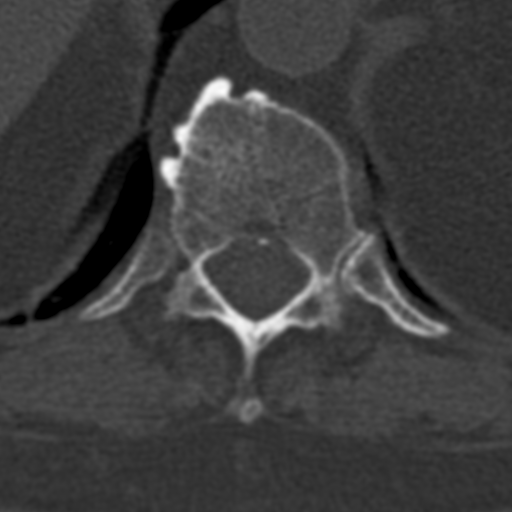

[16 of 36 positions shown; findings below may reference images not displayed]

FINDINGS: Cardiovascular: Cardiac structures are not significantly enlarged.
Coronary calcifications are seen. Aortic calcifications are noted
without aneurysmal dilatation.

Mediastinum/Nodes: The thoracic inlet is within normal limits. No
hilar or mediastinal adenopathy is noted.

Lungs/Pleura: The lungs are again well aerated without focal
infiltrate or sizable effusion. A tiny nodular density is again seen
in the superior segment of the left lower lobe measuring 4 mm and
stable from the prior exam. No other nodular changes are seen.

Upper Abdomen: Changes consistent with prior cholecystectomy are
noted. A hypodensity with peripheral calcification is again noted
within the spleen and stable. Fluid attenuation lesion is again seen
in the left lobe of the liver stable from the prior exam.

Musculoskeletal: Changes consistent with prior right mastectomy with
reconstruction are seen. Postsurgical changes are noted in the left
breast as well. Loop recorder is noted in the anterior chest wall.
The sclerotic changes in the T7 vertebral body are less prevalent
than that seen on the prior exam.
IMPRESSION: Stable left upper lobe nodule. Given the stability over 2 years, no
further follow-up is recommended.

Stable coronary atherosclerotic disease.

Stable changes in the upper abdomen from the prior exam.

## 2018-06-06 ENCOUNTER — Ambulatory Visit
Admission: RE | Admit: 2018-06-06 | Discharge: 2018-06-06 | Disposition: A | Discharge status: Home / Self Care | Payer: Medicare HMO | Source: Ambulatory Visit | Attending: Physician Assistant | Admitting: Physician Assistant

## 2018-06-06 DIAGNOSIS — R221 Localized swelling, mass and lump, neck: Secondary | ICD-10-CM

## 2018-06-09 ENCOUNTER — Other Ambulatory Visit: Payer: Medicare HMO

## 2018-06-11 DIAGNOSIS — E119 Type 2 diabetes mellitus without complications: Secondary | ICD-10-CM | POA: Diagnosis not present

## 2018-06-11 LAB — HM DIABETES EYE EXAM

## 2018-06-17 DIAGNOSIS — J029 Acute pharyngitis, unspecified: Secondary | ICD-10-CM | POA: Diagnosis not present

## 2018-06-17 DIAGNOSIS — R42 Dizziness and giddiness: Secondary | ICD-10-CM | POA: Diagnosis not present

## 2018-06-17 DIAGNOSIS — H6690 Otitis media, unspecified, unspecified ear: Secondary | ICD-10-CM | POA: Diagnosis not present

## 2018-06-17 DIAGNOSIS — H6980 Other specified disorders of Eustachian tube, unspecified ear: Secondary | ICD-10-CM | POA: Diagnosis not present

## 2018-06-18 ENCOUNTER — Telehealth: Payer: Self-pay | Admitting: Family Medicine

## 2018-06-18 ENCOUNTER — Ambulatory Visit: Payer: Medicare HMO | Admitting: Radiation Oncology

## 2018-06-18 NOTE — Telephone Encounter (Signed)
Pt daughter is asking for a letter stating that it is ok for the patient to have a CT of her Ear since the PCP ( Lada ) does not want her to have anymore Radiation. The [facility] is asking for this to be done due to drainage. The patient was complaining when she went to the ER.

## 2018-06-18 NOTE — Telephone Encounter (Signed)
Left detailed voicemail with daughter

## 2018-06-18 NOTE — Telephone Encounter (Signed)
If the ENT absolutely believes that she needs a CT scan, then she can have the CT scan I just wanted her to be aware of all of the abdominal CT scans she keeps getting (abdomen/pelvis in October times 2, abdomen/pelvis in May, abdomen/pelvis in June 2018, lumbar spine March 2018, chest March 2018, pelvis Feb 2018, renal stone study Jan 2018, etc.)

## 2018-06-20 DIAGNOSIS — E538 Deficiency of other specified B group vitamins: Secondary | ICD-10-CM | POA: Diagnosis not present

## 2018-06-20 DIAGNOSIS — G4700 Insomnia, unspecified: Secondary | ICD-10-CM | POA: Diagnosis not present

## 2018-06-20 DIAGNOSIS — G8929 Other chronic pain: Secondary | ICD-10-CM | POA: Diagnosis not present

## 2018-06-20 DIAGNOSIS — M545 Low back pain: Secondary | ICD-10-CM | POA: Diagnosis not present

## 2018-06-20 DIAGNOSIS — F0280 Dementia in other diseases classified elsewhere without behavioral disturbance: Secondary | ICD-10-CM | POA: Diagnosis not present

## 2018-06-20 DIAGNOSIS — F0150 Vascular dementia without behavioral disturbance: Secondary | ICD-10-CM | POA: Diagnosis not present

## 2018-06-20 DIAGNOSIS — Z8659 Personal history of other mental and behavioral disorders: Secondary | ICD-10-CM | POA: Diagnosis not present

## 2018-06-20 DIAGNOSIS — G309 Alzheimer's disease, unspecified: Secondary | ICD-10-CM | POA: Diagnosis not present

## 2018-06-26 ENCOUNTER — Ambulatory Visit
Admission: RE | Admit: 2018-06-26 | Discharge: 2018-06-26 | Disposition: A | Discharge status: Home / Self Care | Payer: Medicare HMO | Source: Ambulatory Visit | Attending: Family Medicine | Admitting: Family Medicine

## 2018-06-26 ENCOUNTER — Encounter: Payer: Self-pay | Admitting: Family Medicine

## 2018-06-26 DIAGNOSIS — R928 Other abnormal and inconclusive findings on diagnostic imaging of breast: Secondary | ICD-10-CM | POA: Diagnosis not present

## 2018-06-26 DIAGNOSIS — Z853 Personal history of malignant neoplasm of breast: Secondary | ICD-10-CM

## 2018-06-26 DIAGNOSIS — Z780 Asymptomatic menopausal state: Secondary | ICD-10-CM | POA: Diagnosis not present

## 2018-06-26 DIAGNOSIS — M8589 Other specified disorders of bone density and structure, multiple sites: Secondary | ICD-10-CM | POA: Diagnosis not present

## 2018-06-26 DIAGNOSIS — M8580 Other specified disorders of bone density and structure, unspecified site: Secondary | ICD-10-CM | POA: Insufficient documentation

## 2018-06-26 NOTE — Progress Notes (Signed)
Please let patient know that her bone density shows that she is at increased risk for a hip fracture. She has about a 1 in 20 chance over the next 10 years of having a hip fracture, and about a 1 in 6 chance of having any kind of major fracture over the next 10 years. Please schedule her to see me or NP for discussion of her bone density, fall precautions, medicine options, etc.

## 2018-07-01 ENCOUNTER — Other Ambulatory Visit: Payer: Self-pay

## 2018-07-01 ENCOUNTER — Ambulatory Visit: Payer: Medicare HMO | Admitting: Gastroenterology

## 2018-07-01 ENCOUNTER — Encounter: Payer: Self-pay | Admitting: Gastroenterology

## 2018-07-01 ENCOUNTER — Encounter

## 2018-07-01 VITALS — BP 122/72 | HR 80 | Ht 62.0 in | Wt 163.4 lb

## 2018-07-01 DIAGNOSIS — R1310 Dysphagia, unspecified: Secondary | ICD-10-CM

## 2018-07-01 DIAGNOSIS — R1319 Other dysphagia: Secondary | ICD-10-CM

## 2018-07-01 NOTE — Progress Notes (Signed)
Jonathon Bellows MD, MRCP(U.K) [place]  [place], [place] [address]  Main: [phone]  Fax: [phone]   Primary Care Physician: Arnetha Courser, MD  Primary Gastroenterologist:  Dr. Jonathon Bellows   No chief complaint on file.   HPI: [name] is a 84 y.o. female   Summary of history : She is here today to follow up for dysphagia, anal pain . She was previously under the care of GI at [facility] who had dilated her esophagus in the past (schatzkis ring dilated to 18 mm in 2016 ). She has a history of GERD. Hb 13.4 on 01/09/17 . She was referred and seen by me on 02/19/17 for a thickened esophagus seen on a CT scan of the abdomen . She had dysphagia for a few months on and off. 04/01/17 - EGD- Schatzkis ring dilated, small duodenal polyps excisedwhich was benign. Esophageal biopsies showed features of reflux.    Interval history  06/03/17-07/01/18   Sigmoidoscopy on 06/05/18 showed inflammation in the rectum which was mild otherwise normal : advised hydrocortisone supp.   Says since a month having issues with swallowing. On and off ,   She threw away her lower false teeth .     Current Outpatient Medications  Medication Sig Dispense Refill  . acetaminophen (TYLENOL) 500 MG tablet Take 500 mg by mouth every 8 (eight) hours as needed.     Marland Kitchen amLODipine (NORVASC) 2.5 MG tablet TAKE 1 TABLET (2.5 MG TOTAL) BY MOUTH DAILY. 30 tablet 11  . aspirin 81 MG tablet Take 81 mg by mouth daily.     Marland Kitchen atenolol (TENORMIN) 25 MG tablet Take by mouth daily.    Marland Kitchen atorvastatin (LIPITOR) 40 MG tablet TAKE 1 TABLET BY MOUTH EVERYDAY AT BEDTIME 90 tablet 2  . b complex vitamins tablet Take 1 tablet by mouth daily.    . Carboxymethylcell-Hypromellose (GENTEAL OP) Apply 1 drop to eye. Each eye as needed    . cetirizine (ZYRTEC) 10 MG tablet Take 10 mg daily by mouth.     . Cholecalciferol (VITAMIN D-1000 MAX ST) 1000 units tablet Take 5,000 Units once a week by mouth.      . Cyanocobalamin (VITAMIN B-12) 1000 MCG SUBL 2 (two) times a week.     . dicyclomine (BENTYL) 10 MG capsule Take 10 mg as needed by mouth.     . DULoxetine (CYMBALTA) 20 MG capsule Take 20 mg by mouth daily.  0  . DULoxetine (CYMBALTA) 30 MG capsule Take 30 mg by mouth daily.    . ferrous sulfate 325 (65 FE) MG tablet Take 325 mg daily by mouth.     . fluticasone (FLONASE) 50 MCG/ACT nasal spray SPRAY 2 SPRAYS EACH INTO BOTH NOSTRILS ONCE prn  5  . Incontinence Supply Disposable (DEPEND UNDERWEAR LARGE) MISC     . LACTOBACILLUS PO Take daily by mouth.     . loratadine (CLARITIN) 10 MG tablet Take 10 mg by mouth daily.    . Loratadine 10 MG CAPS Take 10 mg by mouth daily.     . meclizine (ANTIVERT) 25 MG tablet TAKE 1 TABLET (25 MG TOTAL) BY MOUTH EVERY 8 (EIGHT) HOURS AS NEEDED FOR DIZZINESS. 30 tablet 1  . memantine (NAMENDA) 10 MG tablet Take 10 mg by mouth 2 (two) times daily.     . methylPREDNISolone (MEDROL DOSEPAK) 4 MG TBPK tablet TAKE 6 TABLETS ON DAY 1 AS DIRECTED ON PACKAGE AND DECREASE BY  1 TAB EACH DAY FOR A TOTAL OF 6 DAYS  0  . neomycin-polymyxin b-dexamethasone (MAXITROL) 3.5-10000-0.1 OINT PLACE SMALL RIBBON IN BOTH EYES 2 TIMES A DAY FOR 14 DAYS  0  . omeprazole (PRILOSEC) 20 MG capsule TAKE 1 CAPSULE BY MOUTH EVERY DAY 90 capsule 1  . ondansetron (ZOFRAN) 4 MG tablet Take 1 tablet (4 mg total) by mouth daily as needed for nausea or vomiting. (Patient not taking: Reported on 05/16/2018) 30 tablet 0  . oxyCODONE-acetaminophen (PERCOCET) 5-325 MG tablet Take 0.5 tablets by mouth every 4 (four) hours as needed for moderate pain or severe pain. (Patient not taking: Reported on 05/16/2018) 2 tablet 0  . pioglitazone (ACTOS) 15 MG tablet Take 1 tablet (15 mg total) by mouth daily. For diabetes 30 tablet 5  . polyethylene glycol powder (GLYCOLAX/MIRALAX) powder 17 grams daily 255 g 3  . Probiotic Product (PROBIOTIC COLON SUPPORT) CAPS Take 1 capsule by mouth daily.     . sertraline  (ZOLOFT) 50 MG tablet TAKE 1 TABLET (50 MG TOTAL) BY MOUTH DAILY. (Patient not taking: Reported on 05/16/2018) 90 tablet 2  . tiZANidine (ZANAFLEX) 2 MG tablet Take 2 mg every 6 (six) hours by mouth.  1   No current facility-administered medications for this visit.     Allergies as of 07/01/2018 - Review Complete 05/16/2018  Allergen Reaction Noted  . Aricept [donepezil hcl] Other (See Comments) 04/27/2016  . Biaxin [clarithromycin] Other (See Comments) and Diarrhea 06/16/2008  . Copaxone [glatiramer acetate]  01/20/2015  . Decadron [dexamethasone] Nausea And Vomiting and Other (See Comments) 01/06/2015  . Dexamethasone sodium phosphate Other (See Comments) 07/20/2015  . Diltiazem hcl Other (See Comments)   . Diltiazem hcl Other (See Comments) 02/21/2015  . Flagyl [metronidazole] Nausea Only and Diarrhea 01/06/2015  . Gabapentin Other (See Comments)   . Iohexol Swelling and Other (See Comments) 08/22/2008  . Ivp dye [iodinated diagnostic agents] Swelling and Other (See Comments) 01/06/2015  . Levothyroxine Nausea Only 01/13/2014  . Macrobid [nitrofurantoin macrocrystal] Nausea And Vomiting 08/30/2016  . Sulfa antibiotics Other (See Comments) 11/24/2015  . Sulfonamide derivatives Other (See Comments)   . Synthroid [levothyroxine sodium]  01/20/2015  . Copaxone  [glatiramer] Rash 02/21/2015  . Interferon beta-1a Other (See Comments) and Rash     ROS:  General: Negative for anorexia, weight loss, fever, chills, fatigue, weakness. ENT: Negative for hoarseness, difficulty swallowing , nasal congestion. CV: Negative for chest pain, angina, palpitations, dyspnea on exertion, peripheral edema.  Respiratory: Negative for dyspnea at rest, dyspnea on exertion, cough, sputum, wheezing.  GI: See history of present illness. GU:  Negative for dysuria, hematuria, urinary incontinence, urinary frequency, nocturnal urination.  Endo: Negative for unusual weight change.    Physical Examination:    There were no vitals taken for this visit.  General: Well-nourished, well-developed in no acute distress.  Eyes: No icterus. Conjunctivae pink. Mouth: Oropharyngeal mucosa moist and pink , no lesions erythema or exudate. Lungs: Clear to auscultation bilaterally. Non-labored. Heart: Regular rate and rhythm, no murmurs rubs or gallops.  Abdomen: Bowel sounds are normal, nontender, nondistended, no hepatosplenomegaly or masses, no abdominal bruits or hernia , no rebound or guarding.   Extremities: No lower extremity edema. No clubbing or deformities. Neuro: Alert and oriented x 3.  Grossly intact. Skin: Warm and dry, no jaundice.   Psych: Alert and cooperative, normal mood and affect.   Imaging Studies: US Soft Tissue Head & Neck (non-thyroid)  Result Date: 06/06/2018 CLINICAL DATA:  Neck mass EXAM: ULTRASOUND OF HEAD/NECK SOFT TISSUES TECHNIQUE: Ultrasound examination of the head and neck soft tissues was performed in the area of clinical concern. COMPARISON:  None. FINDINGS: Patient describes tenderness in the left submandibular region. Small lymph node is seen in this area measuring 5 mm in short axis. Fatty hilum and benign appearance by ultrasound. No other mass or cyst identified in the left submandibular region. IMPRESSION: Benign-appearing submandibular lymph node. No cystic or solid mass lesion in the left submandibular region. Electronically Signed   By: Franchot Gallo M.D.   On: 06/06/2018 16:28   Dg Bone Density  Result Date: 06/26/2018 EXAM: DUAL X-RAY ABSORPTIOMETRY (DXA) FOR BONE MINERAL DENSITY IMPRESSION: jbh Your patient [name] completed a BMD test on 06/26/2018 using the [place] (analysis version: 14.10) manufactured by [employer]. The following summarizes the results of our evaluation. PATIENT BIOGRAPHICAL: Name: [name], [name] Patient ID: [id number] Birth Date: [birth year] Height: 62.0 in. Gender: Female Exam Date: 06/26/2018 Weight: 162.5 lbs.  Indications: Advanced Age, Caucasian, History of Breast Cancer, History of Radiation, History of Spinal Surgery, Postmenopausal Fractures: Treatments: Claritin, flonax, Vitamin D ASSESSMENT: The BMD measured at Femur Neck Left is 0.731 g/cm2 with a T-score of -2.2. This patient is considered osteopenic according to [facility]) criteria. Lumbar spine was not utilized due to advanced degenerative changes. The quality of the scan is good. Site Region Measured Measured WHO Young Adult BMD Date       Age      Classification T-score DualFemur Neck Left 06/26/2018 86.5 Osteopenia -2.2 0.731 g/cm2 Left Forearm Radius 33% 06/26/2018 86.5 Osteopenia -1.6 0.737 g/cm2 World Health Organization [facility]) criteria for post-menopausal, Caucasian Women: Normal:       T-score at or above -1 SD Osteopenia:   T-score between -1 and -2.5 SD Osteoporosis: T-score at or below -2.5 SD RECOMMENDATIONS: 1. All patients should optimize calcium and vitamin D intake. 2. Consider FDA-approved medical therapies in postmenopausal women and men aged 30 years and older, based on the following: a. A hip or vertebral(clinical or morphometric) fracture b. T-score < -2.5 at the femoral neck or spine after appropriate evaluation to exclude secondary causes c. Low bone mass (T-score between -1.0 and -2.5 at the femoral neck or spine) and a 10-year probability of a hip fracture > 3% or a 10-year probability of a major osteoporosis-related fracture > 20% based on the [place]-adapted WHO algorithm d. Clinician judgment and/or patient preferences may indicate treatment for people with 10-year fracture probabilities above or below these levels FOLLOW-UP: People with diagnosed cases of osteoporosis or at high risk for fracture should have regular bone mineral density tests. For patients eligible for Medicare, routine testing is allowed once every 2 years. The testing frequency can be increased to one year for patients who have rapidly progressing  disease, those who are receiving or discontinuing medical therapy to restore bone mass, or have additional risk factors. I have reviewed this report, and agree with the above findings. [facility] Radiology Dear Dr. Sanda Klein, Your patient [name] completed a FRAX assessment on 06/26/2018 using the [place] (analysis version: 14.10) manufactured by [employer]. The following summarizes the results of our evaluation. PATIENT BIOGRAPHICAL: Name: [name], [name] Patient ID: [id number] Birth Date: 08-05-1930 Height:    62.0 in. Gender:     Female    [age]       Weight:  162.5 lbs. Ethnicity:  White                            Exam Date: 06/26/2018 FRAX* RESULTS:  (version: 3.5) 10-year Probability of Fracture1 Major Osteoporotic Fracture2 Hip Fracture 15.7% 5.3% Population: [place] (Caucasian) Risk Factors: None Based on Femur (Left) Neck BMD 1 -The 10-year probability of fracture may be lower than reported if the patient has received treatment. 2 -Major Osteoporotic Fracture: Clinical Spine, Forearm, Hip or Shoulder *FRAX is a Materials engineer of the [employer] for Metabolic Bone Disease, a [place] (WHO) [facility]. ASSESSMENT: The probability of a major osteoporotic fracture is 15.7% within the next ten years. The probability of a hip fracture is 5.3% within the next ten years. . Electronically Signed   By: Lowella Grip III M.D.   On: 06/26/2018 09:26   Mm Diag Breast W/implant Tomo Bilateral  Result Date: 06/26/2018 CLINICAL DATA:  Status post lumpectomy and radiation therapy for breast cancer in 2015. Status post right subcutaneous mastectomy and implant reconstruction for mastitis in 1970 and 1975 respectively. EXAM: 2D DIGITAL DIAGNOSTIC BILATERAL MAMMOGRAM WITH IMPLANTS, CAD AND ADJUNCT TOMO The patient has a retropectoral implant on the right. Standard and implant displaced views were performed. COMPARISON:  Previous  exam(s). ACR Breast Density Category b: There are scattered areas of fibroglandular density. FINDINGS: Stable mammographic appearance of the breasts with no findings suspicious for malignancy in either breast. Post lumpectomy changes are again noted on the left. Mammographic images were processed with CAD. IMPRESSION: No evidence of malignancy. RECOMMENDATION: Bilateral diagnostic mammogram in 1 year. I have discussed the findings and recommendations with the patient. Results were also provided in writing at the conclusion of the visit. If applicable, a reminder letter will be sent to the patient regarding the next appointment. BI-RADS CATEGORY  2: Benign. Electronically Signed   By: Claudie Revering M.D.   On: 06/26/2018 09:44    Assessment and Plan:   [name] is a 84 y.o. y/o female  follow up for dysphagia . Did well after dilation in 2018, last 1 month new onset dysphagia . Also has history GERD.    Plan  1. EGD +/- dilation  2. Continue Pirlosec 3. Does not have any teeth on her lower jaw and hence cannot chew her food. Would help if she gets a set of teeth for her lower jaw.   I have discussed alternative options, risks & benefits,  which include, but are not limited to, bleeding, infection, perforation,respiratory complication & drug reaction.  The patient agrees with this plan & written consent will be obtained.    Dr Jonathon Bellows  MD,MRCP [facility]) Follow up PRN

## 2018-07-02 DIAGNOSIS — H908 Mixed conductive and sensorineural hearing loss, unspecified: Secondary | ICD-10-CM | POA: Diagnosis not present

## 2018-07-02 DIAGNOSIS — H9210 Otorrhea, unspecified ear: Secondary | ICD-10-CM | POA: Diagnosis not present

## 2018-07-02 DIAGNOSIS — H9212 Otorrhea, left ear: Secondary | ICD-10-CM | POA: Diagnosis not present

## 2018-07-02 DIAGNOSIS — H9202 Otalgia, left ear: Secondary | ICD-10-CM | POA: Diagnosis not present

## 2018-07-07 ENCOUNTER — Encounter: Payer: Self-pay | Admitting: Radiation Oncology

## 2018-07-07 ENCOUNTER — Other Ambulatory Visit: Payer: Self-pay

## 2018-07-07 ENCOUNTER — Ambulatory Visit
Admission: RE | Admit: 2018-07-07 | Discharge: 2018-07-07 | Disposition: A | Discharge status: Home / Self Care | Payer: Medicare HMO | Source: Ambulatory Visit | Attending: Radiation Oncology | Admitting: Radiation Oncology

## 2018-07-07 VITALS — BP 125/70 | HR 91 | Temp 98.4°F | Wt 162.4 lb

## 2018-07-07 DIAGNOSIS — Z86000 Personal history of in-situ neoplasm of breast: Secondary | ICD-10-CM | POA: Insufficient documentation

## 2018-07-07 DIAGNOSIS — Z923 Personal history of irradiation: Secondary | ICD-10-CM | POA: Insufficient documentation

## 2018-07-07 DIAGNOSIS — D0512 Intraductal carcinoma in situ of left breast: Secondary | ICD-10-CM

## 2018-07-07 NOTE — Progress Notes (Signed)
Radiation Oncology Follow up Note  Name: [name]   Date:   07/07/2018 MRN:  [id number] DOB: 1930/04/15    This 84 y.o. 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 Seen today in routine follow-up she is doing well. She does still have inversion of her left nipple. She specifically denies breast tenderness cough or bone pain..he had mammograms this month which were BI-RADS 2 benign which I have reviewed.  COMPLICATIONS OF TREATMENT: none  FOLLOW UP COMPLIANCE: keeps appointments   PHYSICAL EXAM:  BP 125/70 (BP Location: Left Arm, Patient Position: Sitting)   Pulse 91   Temp 98.4 F (36.9 C) (Tympanic)   Wt 162 lb 5.9 oz (73.6 kg)   BMI 29.70 kg/m  She does have inversion of the left nipple. Lungs are clear to A&P cardiac examination essentially unremarkable with regular rate and rhythm. No dominant mass or nodularity is noted in either breast in 2 positions examined. Incision is well-healed. No axillary or supraclavicular adenopathy is appreciated. Cosmetic result is excellent.Well-developed well-nourished patient in NAD. HEENT reveals PERLA, EOMI, discs not visualized.  Oral cavity is clear. No oral mucosal lesions are identified. Neck is clear without evidence of cervical or supraclavicular adenopathy. Lungs are clear to A&P. Cardiac examination is essentially unremarkable with regular rate and rhythm without murmur rub or thrill. Abdomen is benign with no organomegaly or masses noted. Motor sensory and DTR levels are equal and symmetric in the upper and lower extremities. Cranial nerves II through XII are grossly intact. Proprioception is intact. No peripheral adenopathy or edema is identified. No  motor or sensory levels are noted. Crude visual fields are within normal range.  RADIOLOGY RESULTS: mammograms reviewed and compatible with the above-stated findings  PLAN: based on the fact this is ductal carcinoma in situ elderly lady with transportation issues I am going to discontinue follow-up care at this point. She continues follow-up care with her family practitioner who is been ordering her mammograms. I will be happy to reevaluate the patient any time should further treatment or consultation be indicated. Patient and family know to call with any concerns.  I would like to take this opportunity to thank you for allowing me to participate in the care of your patient.Noreene Filbert, MD

## 2018-07-08 ENCOUNTER — Encounter: Payer: Self-pay | Admitting: Emergency Medicine

## 2018-07-08 ENCOUNTER — Emergency Department: Payer: Medicare HMO

## 2018-07-08 ENCOUNTER — Emergency Department
Admission: EM | Admit: 2018-07-08 | Discharge: 2018-07-08 | Disposition: A | Discharge status: Home / Self Care | Payer: Medicare HMO | Attending: Emergency Medicine | Admitting: Emergency Medicine

## 2018-07-08 ENCOUNTER — Other Ambulatory Visit: Payer: Self-pay

## 2018-07-08 DIAGNOSIS — Z85828 Personal history of other malignant neoplasm of skin: Secondary | ICD-10-CM | POA: Insufficient documentation

## 2018-07-08 DIAGNOSIS — I10 Essential (primary) hypertension: Secondary | ICD-10-CM | POA: Insufficient documentation

## 2018-07-08 DIAGNOSIS — N23 Unspecified renal colic: Secondary | ICD-10-CM | POA: Diagnosis not present

## 2018-07-08 DIAGNOSIS — Z79899 Other long term (current) drug therapy: Secondary | ICD-10-CM | POA: Insufficient documentation

## 2018-07-08 DIAGNOSIS — Z7982 Long term (current) use of aspirin: Secondary | ICD-10-CM | POA: Insufficient documentation

## 2018-07-08 DIAGNOSIS — Z7984 Long term (current) use of oral hypoglycemic drugs: Secondary | ICD-10-CM | POA: Insufficient documentation

## 2018-07-08 DIAGNOSIS — N132 Hydronephrosis with renal and ureteral calculous obstruction: Secondary | ICD-10-CM | POA: Diagnosis not present

## 2018-07-08 DIAGNOSIS — E119 Type 2 diabetes mellitus without complications: Secondary | ICD-10-CM | POA: Insufficient documentation

## 2018-07-08 DIAGNOSIS — F0390 Unspecified dementia without behavioral disturbance: Secondary | ICD-10-CM | POA: Insufficient documentation

## 2018-07-08 DIAGNOSIS — R109 Unspecified abdominal pain: Secondary | ICD-10-CM | POA: Diagnosis present

## 2018-07-08 LAB — COMPREHENSIVE METABOLIC PANEL
ALBUMIN: 4.3 g/dL (ref 3.5–5.0)
ALT: 20 U/L (ref 0–44)
AST: 14 U/L — ABNORMAL LOW (ref 15–41)
Alkaline Phosphatase: 52 U/L (ref 38–126)
Anion gap: 9 (ref 5–15)
BUN: 21 mg/dL (ref 8–23)
CHLORIDE: 109 mmol/L (ref 98–111)
CO2: 25 mmol/L (ref 22–32)
Calcium: 9.8 mg/dL (ref 8.9–10.3)
Creatinine, Ser: 1.26 mg/dL — ABNORMAL HIGH (ref 0.44–1.00)
GFR calc Af Amer: 45 mL/min — ABNORMAL LOW (ref >60)
GFR calc non Af Amer: 39 mL/min — ABNORMAL LOW (ref >60)
GLUCOSE: 152 mg/dL — AB (ref 70–99)
POTASSIUM: 4.2 mmol/L (ref 3.5–5.1)
Sodium: 143 mmol/L (ref 135–145)
Total Bilirubin: 0.7 mg/dL (ref 0.3–1.2)
Total Protein: 7.1 g/dL (ref 6.5–8.1)

## 2018-07-08 LAB — URINALYSIS, COMPLETE (UACMP) WITH MICROSCOPIC
BILIRUBIN URINE: NEGATIVE
Glucose, UA: NEGATIVE mg/dL
KETONES UR: NEGATIVE mg/dL
Leukocytes, UA: NEGATIVE
Nitrite: POSITIVE — AB
Protein, ur: 30 mg/dL — AB
RBC / HPF: >50 RBC/hpf — ABNORMAL HIGH (ref 0–5)
Specific Gravity, Urine: 1.021 (ref 1.005–1.030)
pH: 5 (ref 5.0–8.0)

## 2018-07-08 LAB — CBC WITH DIFFERENTIAL/PLATELET
Abs Immature Granulocytes: 0.03 10*3/uL (ref 0.00–0.07)
BASOS ABS: 0 10*3/uL (ref 0.0–0.1)
BASOS PCT: 0 %
EOS ABS: 0 10*3/uL (ref 0.0–0.5)
Eosinophils Relative: 1 %
HCT: 41.6 % (ref 36.0–46.0)
Hemoglobin: 13.3 g/dL (ref 12.0–15.0)
Immature Granulocytes: 0 %
LYMPHS ABS: 1.4 10*3/uL (ref 0.7–4.0)
Lymphocytes Relative: 18 %
MCH: 31 pg (ref 26.0–34.0)
MCHC: 32 g/dL (ref 30.0–36.0)
MCV: 97 fL (ref 80.0–100.0)
Monocytes Absolute: 0.6 10*3/uL (ref 0.1–1.0)
Monocytes Relative: 7 %
NEUTROS PCT: 74 %
NRBC: 0 % (ref 0.0–0.2)
Neutro Abs: 5.6 10*3/uL (ref 1.7–7.7)
PLATELETS: 183 10*3/uL (ref 150–400)
RBC: 4.29 MIL/uL (ref 3.87–5.11)
RDW: 13.9 % (ref 11.5–15.5)
WBC: 7.6 10*3/uL (ref 4.0–10.5)

## 2018-07-08 MED ORDER — CEPHALEXIN 500 MG PO CAPS: 500.0000 mg | ORAL_CAPSULE | Freq: Four times a day (QID) | ORAL | 0 refills | Status: AC

## 2018-07-08 MED ORDER — HYDROMORPHONE HCL 1 MG/ML IJ SOLN
0.5000 mg | Freq: Once | INTRAMUSCULAR | Status: AC
  Administered 2018-07-08: 0.5 mg via INTRAVENOUS
  Filled 2018-07-08: qty 1

## 2018-07-08 MED ORDER — OXYCODONE-ACETAMINOPHEN 5-325 MG PO TABS: 1.0000 | ORAL_TABLET | ORAL | 0 refills | Status: DC | PRN

## 2018-07-08 MED ORDER — CEPHALEXIN 500 MG PO CAPS
500.0000 mg | ORAL_CAPSULE | Freq: Once | ORAL | Status: AC
  Administered 2018-07-08: 500 mg via ORAL
  Filled 2018-07-08: qty 1

## 2018-07-08 NOTE — ED Triage Notes (Addendum)
Patient ambulatory to triage with steady gait, without difficulty or distress noted; pt reports right flank pain radiating into right side and abd accomp by nausea and urinary urgency today

## 2018-07-08 NOTE — Discharge Instructions (Addendum)
The strain all your urine.  Use the Percocet 1 or 2 pills 4 times a day as needed for pain.  Return for worse pain, fever or vomiting.  Take the Keflex 1 pill 4 times a day because you might have a slight infection along with a kidney stone.  Give Dr. Bernardo Heater the urologist call tomorrow and set up an appointment in about a week or so.

## 2018-07-08 NOTE — ED Notes (Signed)
Assisted pt to the bathroom with one person assist. Pt instructed to pull red cord when she is finished. Pt reports that she feels like she has to go all the time and she gets goose bumps while urinating.

## 2018-07-08 NOTE — ED Provider Notes (Signed)
[facility] Emergency Department Provider Note   ____________________________________________   First MD Initiated Contact with Patient 07/08/18 2019     (approximate)  I have reviewed the triage vital signs and the nursing notes.   HISTORY  Chief Complaint Abdominal Pain    HPI FOLASHADE GAMBOA is a 84 y.o. female patient complains of right-sided abdominal pain radiating down toward the groin.  Came on last night or this morning she is not sure which she says she just started eardrops and Cipro for a left ear infection she is not complaining of dysuria at this point.  Patient and family say that all the records are in the computer but they are not the pain she is having is moderately severe.  She cannot describe it well to me.  Is worse with palpation worse when she walks it is on the entire right side of the abdomen   Past Medical History:  Diagnosis Date  . Anxiety and depression   . Breast cancer ([place]) 2015   LT LUMPECTOMY  . Carpal tunnel syndrome   . Chest pain    SECONDARY TO GERD  . Chronic interstitial cystitis   . Chronic right hip pain   . Depression   . Diabetes mellitus without complication ([place])   . Diverticulosis   . Dyslipidemia   . Essential hypertension, benign   . Fibromyalgia   . Frequent falls   . GERD (gastroesophageal reflux disease)   . Hearing loss of left ear   . History of fractured rib   . History of TIA (transient ischemic attack)   . Irritable bowel   . Lumbar arthropathy 10/04/2016   On CT scan, refer to ortho  . MS (multiple sclerosis) ([place])   . Osteoarthritis   . Paroxysmal supraventricular tachycardia ([place])   . Peripheral neuropathy    MILD  . Personal history of radiation therapy 2015   BREAST CA  . Protein malnutrition ([place])   . Pure hypercholesterolemia   . Recurrent UTI   . Skin cancer   . SOB (shortness of breath)    SECONDARY TO REFLUX  . Solitary pulmonary nodule on lung CT 12/04/2015   3 mm LLL  lung nodule June 2016, March 2017  . Tachycardia, paroxysmal (HCC)    Reportedly Paroxysmal Supraventricular Tachycardia, but unable to find documentation confirming this  . Thickening of esophagus 01/11/2017   Noted on CT scan June 2018  . Thyroid nodule   . Vertigo   . Vitamin B 12 deficiency     Patient Active Problem List   Diagnosis Date Noted  . Osteopenia 06/26/2018  . History of radiation exposure 05/16/2018  . Chronic bilateral lower abdominal pain 05/16/2018  . Degenerative arthritis of thoracic spine 05/16/2018  . DNAR (do not attempt resuscitation) 04/30/2017  . Preventative health care 04/30/2017  . Thickening of esophagus 01/11/2017  . Lumbar arthropathy 10/04/2016  . Vertigo, aural, left 07/31/2016  . Rectal pain 06/25/2016  . External hemorrhoid 06/18/2016  . Head injury due to trauma 05/09/2016  . Neoplasm of uncertain behavior of skin 05/09/2016  . Hemorrhoids 04/24/2016  . Dementia ([place]) 04/24/2016  . Medication monitoring encounter 04/03/2016  . Gait abnormality 04/03/2016  . Skin lump of leg 04/03/2016  . Pain in right knee 02/15/2016  . Pain and swelling of right knee 02/13/2016  . Solitary pulmonary nodule on lung CT 12/04/2015  . Generalized degenerative joint disease of hand 11/21/2015  . Triggering of digit 11/21/2015  .  Snapping thumb syndrome 11/21/2015  . Pain, joint, hand 11/11/2015  . Coronary artery calcification seen on CAT scan 11/11/2015  . Multiple sclerosis ([place]) 11/11/2015  . Chronic pain of multiple joints 09/02/2015  . Right lower quadrant abdominal pain 08/02/2015  . Microscopic hematuria 07/12/2015  . Recurrent UTI 04/09/2015  . Carpal tunnel syndrome 01/12/2014  . DD (diverticular disease) 01/12/2014  . Ductal carcinoma in situ of left breast 01/12/2014  . Hypercholesteremia 01/12/2014  . DS (disseminated sclerosis) ([place]) 01/12/2014  . Arthritis, degenerative 01/12/2014  . Thyroid nodule 01/12/2014  . Cyanocobalamine  deficiency (non anemic) 01/12/2014  . Anxiety and depression 01/12/2014  . Intraductal carcinoma of breast 01/12/2014  . History of right mastectomy 01/12/2014  . DM (diabetes mellitus) type II controlled, neurological manifestation ([place]) 06/26/2010  . Depression, major, recurrent, in partial remission ([place]) 06/26/2010  . Frequent falls 06/26/2010  . Stricture and stenosis of esophagus 06/26/2010  . Gastro-esophageal reflux disease without esophagitis 06/26/2010  . Fibromyalgia 06/26/2010  . Chronic interstitial cystitis 06/26/2010  . H/O malignant neoplasm of breast 06/26/2010  . Hypertension goal BP (blood pressure) < 140/90 06/01/2009  . H/O paroxysmal supraventricular tachycardia 06/01/2009  . Paroxysmal supraventricular tachycardia ([place]) 06/01/2009  . Irritable bowel syndrome with constipation and diarrhea 06/16/2008  . Diarrhea, functional 06/16/2008    Past Surgical History:  Procedure Laterality Date  . APPENDECTOMY    . AUGMENTATION MAMMAPLASTY Right 1975   BREAST BREAST ONLY  . BLADDER TACKING     X 3  . BREAST BIOPSY Left 2015  . BREAST LUMPECTOMY Left 2015  . BREAST SURGERY    . CARDIAC CATHETERIZATION  10/2001   Normal coronary arteries with the exception of 20% proximal D1  . CHOLECYSTECTOMY    . COLONOSCOPY WITH PROPOFOL N/A 01/21/2015   Procedure: COLONOSCOPY WITH PROPOFOL;  Surgeon: Josefine Class, MD;  Location[facility] ENDOSCOPY;  Service: Endoscopy;  Laterality: N/A;  . ESOPHAGOGASTRODUODENOSCOPY N/A 01/21/2015   Procedure: ESOPHAGOGASTRODUODENOSCOPY (EGD);  Surgeon: Josefine Class, MD;  Location[facility] ENDOSCOPY;  Service: Endoscopy;  Laterality: N/A;  . ESOPHAGOGASTRODUODENOSCOPY (EGD) WITH PROPOFOL N/A 04/01/2017   Procedure: ESOPHAGOGASTRODUODENOSCOPY (EGD) WITH PROPOFOL WITH BANDING;  Surgeon: Jonathon Bellows, MD;  Location: [facility] ENDOSCOPY;  Service: Gastroenterology;  Laterality: N/A;  . EYE SURGERIES     WITH BUCKLE DETACHMENT OF THE RETINA  . FLEXIBLE  SIGMOIDOSCOPY N/A 06/05/2017   Procedure: FLEXIBLE SIGMOIDOSCOPY WITHOUT SEDATION;  Surgeon: Jonathon Bellows, MD;  Location: [facility] ENDOSCOPY;  Service: Gastroenterology;  Laterality: N/A;  . heart monitor     Not being used... Patient can not have a MRI  . HEMORRHOID SURGERY     WITH RECONSTRUCTION  . MASTECTOMY Right 1970   SUBCUTANEOUS - NO BREAST CANCER  . TONSILLECTOMY    . TOTAL ABDOMINAL HYSTERECTOMY W/ BILATERAL SALPINGOOPHORECTOMY    . TYMPANOPLASTY      Prior to Admission medications   Medication Sig Start Date End Date Taking? Authorizing Provider  acetaminophen (TYLENOL) 500 MG tablet Take 500 mg by mouth every 8 (eight) hours as needed.     [provider]  amLODipine (NORVASC) 2.5 MG tablet TAKE 1 TABLET (2.5 MG TOTAL) BY MOUTH DAILY. 10/01/17   Arnetha Courser, MD  aspirin 81 MG tablet Take 81 mg by mouth daily.  03/07/17   [provider]  atenolol (TENORMIN) 25 MG tablet Take by mouth daily.    [provider]  atorvastatin (LIPITOR) 40 MG tablet TAKE 1 TABLET BY MOUTH EVERYDAY AT  BEDTIME 02/22/18   Lada, Satira Anis, MD  b complex vitamins tablet Take 1 tablet by mouth 2 (two) times a week.     [provider]  Carboxymethylcell-Hypromellose (GENTEAL OP) Apply 1 drop to eye. Each eye as needed    [provider]  cetirizine (ZYRTEC) 10 MG tablet Take 10 mg daily by mouth.     [provider]  Cholecalciferol (VITAMIN D-1000 MAX ST) 1000 units tablet Take 5,000 Units by mouth 2 (two) times a week.     [provider]  Cyanocobalamin (VITAMIN B-12) 1000 MCG SUBL 2 (two) times a week.  09/16/15   [provider]  dicyclomine (BENTYL) 10 MG capsule Take 10 mg as needed by mouth.  01/21/15   [provider]  DULoxetine (CYMBALTA) 30 MG capsule Take 30 mg by mouth daily. 12/19/17 12/19/18  [provider]  ferrous sulfate 325 (65 FE) MG tablet Take 325 mg daily by mouth.     [provider]    fluticasone (FLONASE) 50 MCG/ACT nasal spray SPRAY 2 SPRAYS EACH INTO BOTH NOSTRILS ONCE prn 10/19/15   [provider]  Incontinence Supply Disposable (DEPEND UNDERWEAR LARGE) MISC  06/10/17   [provider]  LACTOBACILLUS PO Take daily by mouth.     [provider]  loratadine (CLARITIN) 10 MG tablet Take 10 mg by mouth daily.    [provider]  Loratadine 10 MG CAPS Take 10 mg by mouth daily.  03/07/17   [provider]  meclizine (ANTIVERT) 25 MG tablet TAKE 1 TABLET (25 MG TOTAL) BY MOUTH EVERY 8 (EIGHT) HOURS AS NEEDED FOR DIZZINESS. 05/30/18   Arnetha Courser, MD  memantine (NAMENDA) 10 MG tablet Take 10 mg by mouth 2 (two) times daily.  08/11/16   [provider]  methylPREDNISolone (MEDROL DOSEPAK) 4 MG TBPK tablet TAKE 6 TABLETS ON DAY 1 AS DIRECTED ON PACKAGE AND DECREASE BY 1 TAB EACH DAY FOR A TOTAL OF 6 DAYS 06/14/17   [provider]  neomycin-polymyxin b-dexamethasone (MAXITROL) 3.5-10000-0.1 OINT PLACE SMALL RIBBON IN BOTH EYES 2 TIMES A DAY FOR 14 DAYS 02/28/17   [provider]  omeprazole (PRILOSEC) 20 MG capsule TAKE 1 CAPSULE BY MOUTH EVERY DAY 11/25/17   Jonathon Bellows, MD  ondansetron (ZOFRAN) 4 MG tablet Take 1 tablet (4 mg total) by mouth daily as needed for nausea or vomiting. Patient not taking: Reported on 05/16/2018 01/02/18 01/02/19  Delman Kitten, MD  oxyCODONE-acetaminophen (PERCOCET) 5-325 MG tablet Take 0.5 tablets by mouth every 4 (four) hours as needed for moderate pain or severe pain. Patient not taking: Reported on 05/16/2018 01/02/18 01/02/19  Delman Kitten, MD  pioglitazone (ACTOS) 15 MG tablet Take 1 tablet (15 mg total) by mouth daily. For diabetes 02/13/18   Arnetha Courser, MD  polyethylene glycol powder (GLYCOLAX/MIRALAX) powder 17 grams daily 04/22/17   Jonathon Bellows, MD  Probiotic Product (PROBIOTIC COLON SUPPORT) CAPS Take 1 capsule by mouth daily.     [provider]  tiZANidine (ZANAFLEX)  2 MG tablet Take 2 mg every 6 (six) hours by mouth. 06/14/17   [provider]    Allergies Aricept Reather Littler hcl]; Biaxin [clarithromycin]; Copaxone [glatiramer acetate]; Decadron [dexamethasone]; Dexamethasone sodium phosphate; Diltiazem hcl; Diltiazem hcl; Flagyl [metronidazole]; Gabapentin; Iohexol; Ivp dye [iodinated diagnostic agents]; Levothyroxine; Macrobid [nitrofurantoin macrocrystal]; Sulfa antibiotics; Sulfonamide derivatives; Synthroid [levothyroxine sodium]; Copaxone  [glatiramer]; and Interferon beta-1a  Family History  Problem Relation Age of Onset  .  ALS Mother   . Cancer Father        lung  . Aneurysm Brother   . Heart disease Daughter   . Colon cancer Unknown   . Breast cancer Neg Hx   . Bladder Cancer Neg Hx   . Prostate cancer Neg Hx     Social History Social History   Tobacco Use  . Smoking status: Never Smoker  . Smokeless tobacco: Never Used  . Tobacco comment: smoking cessation materials not required  Substance Use Topics  . Alcohol use: No    Alcohol/week: 0.0 standard drinks  . Drug use: No    Review of Systems  Constitutional: No fever/chills Eyes: No visual changes. ENT: No sore throat. Cardiovascular: Denies chest pain. Respiratory: Denies shortness of breath. Gastrointestinal:  abdominal pain.  No nausea, no vomiting.  No diarrhea.  No constipation. Genitourinary: Negative for dysuria. Musculoskeletal: Negative for back pain. Skin: Negative for rash. Neurological: Negative for headaches, focal weakness  ____________________________________________   PHYSICAL EXAM:  VITAL SIGNS: ED Triage Vitals [07/08/18 1908]  Enc Vitals Group     BP (!) 162/94     Pulse Rate 99     Resp 18     Temp 98.5 F (36.9 C)     Temp Source Oral     SpO2 97 %     Weight 160 lb (72.6 kg)     Height 5\' 2"  (1.575 m)     Head Circumference      Peak Flow      Pain Score 10     Pain Loc      Pain Edu?      Excl. in [place]?      Constitutional: Alert and oriented.  In some pain. Eyes: Conjunctivae are normal. PERRL. EOMI. Head: Atraumatic. Nose: No congestion/rhinnorhea. Mouth/Throat: Mucous membranes are moist.  Oropharynx non-erythematous. Neck: No stridor Cardiovascular: Normal rate, regular rhythm. Grossly normal heart sounds.  Good peripheral circulation. Respiratory: Normal respiratory effort.  No retractions. Lungs CTAB. Gastrointestinal: Soft tender to palpation on the right side of the abdomen tender to percussion as well. No distention. No abdominal bruits.  Right CVA tenderness. Musculoskeletal: No lower extremity tenderness nor edema.  No joint effusions. Neurologic:  Normal speech and language. No gross focal neurologic deficits are appreciated.  Skin:  Skin is warm, dry and intact. No rash noted. Psychiatric: Mood and affect are normal. Speech and behavior are normal.  ____________________________________________   LABS (all labs ordered are listed, but only abnormal results are displayed)  Labs Reviewed  COMPREHENSIVE METABOLIC PANEL - Abnormal; Notable for the following components:      Result Value   Glucose, Bld 152 (*)    Creatinine, Ser 1.26 (*)    AST 14 (*)    GFR calc non Af Amer 39 (*)    GFR calc Af Amer 45 (*)    All other components within normal limits  URINALYSIS, COMPLETE (UACMP) WITH MICROSCOPIC - Abnormal; Notable for the following components:   Color, Urine AMBER (*)    APPearance CLEAR (*)    Hgb urine dipstick MODERATE (*)    Protein, ur 30 (*)    Nitrite POSITIVE (*)    RBC / HPF >50 (*)    Bacteria, UA RARE (*)    All other components within normal limits  URINE CULTURE  CBC WITH DIFFERENTIAL/PLATELET   ____________________________________________  EKG   ____________________________________________  RADIOLOGY  ED MD interpretation  Official radiology report(s): Ct Renal Stone  Study  Result Date: 07/08/2018 CLINICAL DATA:  Initial evaluation for  acute right-sided flank pain. EXAM: CT ABDOMEN AND PELVIS WITHOUT CONTRAST TECHNIQUE: Multidetector CT imaging of the abdomen and pelvis was performed following the standard protocol without IV contrast. COMPARISON:  Prior CT from 05/13/2018 FINDINGS: Lower chest: Visualized lung bases are clear. Hepatobiliary: 14 mm cyst present at the superior left hepatic lobe. Liver otherwise unremarkable. Gallbladder surgically absent. No biliary dilatation. Pancreas: Pancreas within normal limits. Spleen: 3.8 cm splenic cyst with peripheral calcification is unchanged. Spleen otherwise unremarkable. Adrenals/Urinary Tract: Adrenal glands within normal limits. Kidneys atrophic in appearance bilaterally. On the right, there is an obstructive 3 mm stone at the right UVJ with secondary mild to moderate right hydroureteronephrosis. Prominent right perinephric and periureteral fat stranding. Additional punctate 3 mm nonobstructive stone present within the lower pole of the right kidney. Subcentimeter parenchymal calcification noted. On the left, no nephrolithiasis or evidence for obstructive uropathy. 17 mm hyperdense lesion likely reflects a proteinaceous and/or hemorrhagic cyst, similar to previous. A few additional small cysts are also unchanged from previous. Bladder largely decompressed. Circumferential bladder wall thickening most likely related to incomplete distension. Superimposed infection could be considered as well. Stomach/Bowel: Small hiatal hernia. Stomach otherwise unremarkable. No evidence for bowel obstruction. Colonic diverticulosis without evidence for acute diverticulitis. No acute inflammatory changes seen about the bowels. Vascular/Lymphatic: Mild for age atherosclerotic change within the intra-abdominal aorta. No aneurysm. No adenopathy. Reproductive: Uterus is absent. Ovaries not discretely identified. Fissural defect at the posterior vagina similar to previous. Other: No free air or fluid. Musculoskeletal:  No acute osseus abnormality. No discrete lytic or blastic osseous lesions. Sequelae of prior PLIF at the L4-5 level noted. Moderate to advanced multilevel degenerative changes elsewhere within the lumbar spine grossly similar. IMPRESSION: 1. 3 mm obstructive stone at the right UVJ with secondary mild to moderate right hydroureteronephrosis. 2. Additional punctate 3 mm nonobstructive stone with bilateral renal atrophy and probable bilateral renal cysts, grossly stable. 3. Colonic diverticulosis without evidence for acute diverticulitis. Electronically Signed   By: Jeannine Boga M.D.   On: 07/08/2018 21:11    ____________________________________________   PROCEDURES  Procedure(s) performed:  Procedures  Critical Care performed:  ____________________________________________   INITIAL IMPRESSION / ASSESSMENT AND PLAN / ED COURSE  [place] patient with Dr. Bernardo Heater on-call for urology.  Patient has 6-10 epis and 6-10 WBCs in her urine.  She does have a 3 mm obstructing stone.  There is greater than 50 RBCs in urine as well.  We will treat the patient with Keflex and get a urine culture.  I am not sure about the Cipro that she is supposed to be taking.  She does not have any prescription for it that I can see.  Additionally I will have her strain her urine and take Percocet as needed for the pain.  Have her follow-up with Dr. Bernardo Heater.         ____________________________________________   FINAL CLINICAL IMPRESSION(S) / ED DIAGNOSES  Final diagnoses:  Renal colic     ED Discharge Orders    None       Note:  This document was prepared using Dragon voice recognition software and may include unintentional dictation errors.    Nena Polio, MD 07/08/18 2227

## 2018-07-08 NOTE — ED Notes (Signed)
Lab notified of add-on urine culture to previously collected UA; lab verbalized acknowledgment and will begin processing at this time.

## 2018-07-10 LAB — URINE CULTURE: Culture: NO GROWTH

## 2018-07-14 ENCOUNTER — Telehealth: Payer: Self-pay | Admitting: Gastroenterology

## 2018-07-14 NOTE — Telephone Encounter (Signed)
Pt left vm she is scheduled for procedure but has a bladder infection and kidney stone we are unsure if we should proceed with procedure she is also on 500mg  on  Sipro please call

## 2018-07-15 ENCOUNTER — Telehealth: Payer: Self-pay | Admitting: Gastroenterology

## 2018-07-15 NOTE — Telephone Encounter (Signed)
[name] LVM that she just took [place] off her asprin and Iron, can she still have her procedure.

## 2018-07-15 NOTE — Telephone Encounter (Signed)
Contacted patient LVM for her to inform her that she can still have her EGD with a bladder infection and kidney stone.  Thanks [employer]

## 2018-07-15 NOTE — Telephone Encounter (Signed)
Spoke with pt daughter/caretaker, [name], I informed her that [name] is still able to have the EGD tomorrow.

## 2018-07-15 NOTE — Telephone Encounter (Signed)
Spoke with pt daughter, [name], regarding her question about pt medication. [name] states pt stopped taking her iron supplements on Monday and wants to know if she should have stopped taking the baby aspirin (81mg ). I explained pt is okay to take the baby aspirin and pt is still able to have her endoscopy procedure.

## 2018-07-16 ENCOUNTER — Encounter
Admission: RE | Disposition: A | Discharge status: Home / Self Care | Payer: Self-pay | Source: Ambulatory Visit | Attending: Gastroenterology

## 2018-07-16 ENCOUNTER — Ambulatory Visit: Payer: Medicare HMO | Admitting: Anesthesiology

## 2018-07-16 ENCOUNTER — Encounter: Payer: Self-pay | Admitting: *Deleted

## 2018-07-16 ENCOUNTER — Ambulatory Visit
Admission: RE | Admit: 2018-07-16 | Discharge: 2018-07-16 | Disposition: A | Discharge status: Home / Self Care | Payer: Medicare HMO | Source: Ambulatory Visit | Attending: Gastroenterology | Admitting: Gastroenterology

## 2018-07-16 DIAGNOSIS — M797 Fibromyalgia: Secondary | ICD-10-CM | POA: Diagnosis not present

## 2018-07-16 DIAGNOSIS — F329 Major depressive disorder, single episode, unspecified: Secondary | ICD-10-CM | POA: Insufficient documentation

## 2018-07-16 DIAGNOSIS — E785 Hyperlipidemia, unspecified: Secondary | ICD-10-CM | POA: Insufficient documentation

## 2018-07-16 DIAGNOSIS — E119 Type 2 diabetes mellitus without complications: Secondary | ICD-10-CM | POA: Insufficient documentation

## 2018-07-16 DIAGNOSIS — K9289 Other specified diseases of the digestive system: Secondary | ICD-10-CM | POA: Diagnosis not present

## 2018-07-16 DIAGNOSIS — Z7984 Long term (current) use of oral hypoglycemic drugs: Secondary | ICD-10-CM | POA: Insufficient documentation

## 2018-07-16 DIAGNOSIS — G629 Polyneuropathy, unspecified: Secondary | ICD-10-CM | POA: Insufficient documentation

## 2018-07-16 DIAGNOSIS — F0390 Unspecified dementia without behavioral disturbance: Secondary | ICD-10-CM | POA: Insufficient documentation

## 2018-07-16 DIAGNOSIS — I10 Essential (primary) hypertension: Secondary | ICD-10-CM | POA: Insufficient documentation

## 2018-07-16 DIAGNOSIS — F418 Other specified anxiety disorders: Secondary | ICD-10-CM | POA: Diagnosis not present

## 2018-07-16 DIAGNOSIS — Z8673 Personal history of transient ischemic attack (TIA), and cerebral infarction without residual deficits: Secondary | ICD-10-CM | POA: Diagnosis not present

## 2018-07-16 DIAGNOSIS — G35 Multiple sclerosis: Secondary | ICD-10-CM | POA: Insufficient documentation

## 2018-07-16 DIAGNOSIS — E7800 Pure hypercholesterolemia, unspecified: Secondary | ICD-10-CM | POA: Insufficient documentation

## 2018-07-16 DIAGNOSIS — K222 Esophageal obstruction: Secondary | ICD-10-CM | POA: Diagnosis not present

## 2018-07-16 DIAGNOSIS — R1310 Dysphagia, unspecified: Secondary | ICD-10-CM

## 2018-07-16 DIAGNOSIS — Z888 Allergy status to other drugs, medicaments and biological substances status: Secondary | ICD-10-CM | POA: Insufficient documentation

## 2018-07-16 DIAGNOSIS — Z882 Allergy status to sulfonamides status: Secondary | ICD-10-CM | POA: Diagnosis not present

## 2018-07-16 DIAGNOSIS — Z7982 Long term (current) use of aspirin: Secondary | ICD-10-CM | POA: Diagnosis not present

## 2018-07-16 DIAGNOSIS — K219 Gastro-esophageal reflux disease without esophagitis: Secondary | ICD-10-CM | POA: Diagnosis not present

## 2018-07-16 DIAGNOSIS — K228 Other specified diseases of esophagus: Secondary | ICD-10-CM | POA: Diagnosis not present

## 2018-07-16 DIAGNOSIS — I2510 Atherosclerotic heart disease of native coronary artery without angina pectoris: Secondary | ICD-10-CM | POA: Diagnosis not present

## 2018-07-16 DIAGNOSIS — Z79899 Other long term (current) drug therapy: Secondary | ICD-10-CM | POA: Diagnosis not present

## 2018-07-16 DIAGNOSIS — Z853 Personal history of malignant neoplasm of breast: Secondary | ICD-10-CM | POA: Diagnosis not present

## 2018-07-16 DIAGNOSIS — Z881 Allergy status to other antibiotic agents status: Secondary | ICD-10-CM | POA: Insufficient documentation

## 2018-07-16 DIAGNOSIS — R1319 Other dysphagia: Secondary | ICD-10-CM

## 2018-07-16 HISTORY — PX: ESOPHAGOGASTRODUODENOSCOPY (EGD) WITH PROPOFOL: SHX5813

## 2018-07-16 SURGERY — ESOPHAGOGASTRODUODENOSCOPY (EGD) WITH PROPOFOL
Anesthesia: General

## 2018-07-16 MED ORDER — PROPOFOL 500 MG/50ML IV EMUL
INTRAVENOUS | Status: DC | PRN
  Administered 2018-07-16: 100 ug/kg/min via INTRAVENOUS

## 2018-07-16 MED ORDER — PROPOFOL 10 MG/ML IV BOLUS
INTRAVENOUS | Status: AC
  Filled 2018-07-16: qty 20

## 2018-07-16 MED ORDER — SODIUM CHLORIDE 0.9 % IV SOLN
INTRAVENOUS | Status: DC
  Administered 2018-07-16: 14:00:00 via INTRAVENOUS

## 2018-07-16 MED ORDER — PROPOFOL 10 MG/ML IV BOLUS
INTRAVENOUS | Status: DC | PRN
  Administered 2018-07-16: 20 mg via INTRAVENOUS

## 2018-07-16 NOTE — Op Note (Signed)
[facility] Gastroenterology Patient Name: [name] Procedure Date: 07/16/2018 2:26 PM MRN: [id number] Account #: [id number] Date of Birth: 1930-07-05 Admit Type: Outpatient Age: 84 Room: [facility] ENDO ROOM 1 Gender: Female Note Status: Finalized Procedure:            Upper GI endoscopy Indications:          Dysphagia Providers:            Jonathon Bellows MD, MD Referring MD:         Arnetha Courser (Referring MD) Medicines:            Monitored Anesthesia Care Complications:        No immediate complications. Procedure:            Pre-Anesthesia Assessment:                       - Prior to the procedure, a History and Physical was                        performed, and patient medications, allergies and                        sensitivities were reviewed. The patient's tolerance of                        previous anesthesia was reviewed.                       - The risks and benefits of the procedure and the                        sedation options and risks were discussed with the                        patient. All questions were answered and informed                        consent was obtained.                       - ASA Grade Assessment: III - A patient with severe                        systemic disease.                       After obtaining informed consent, the endoscope was                        passed under direct vision. Throughout the procedure,                        the patient's blood pressure, pulse, and oxygen                        saturations were monitored continuously. The Endoscope                        was introduced through the mouth, and advanced to the  third part of duodenum. The upper GI endoscopy was                        accomplished with ease. The patient tolerated the                        procedure well. Findings:      The examined duodenum was normal.      The stomach was normal.      The cardia and  gastric fundus were normal on retroflexion.      One benign-appearing, intrinsic moderate (circumferential scarring or       stenosis; an endoscope may pass) stenosis was found at the       gastroesophageal junction. This stenosis measured 1.5 cm (inner       diameter). The stenosis was traversed. A TTS dilator was passed through       the scope. Dilation with a 15-16.5-18 mm balloon dilator was performed       to 18 mm. The dilation site was examined and showed no change.      A single 5 mm mucosal nodule with a localized distribution was found at       the gastroesophageal junction. Biopsies were taken with a cold forceps       for histology.      The exam was otherwise without abnormality. Impression:           - Normal examined duodenum.                       - Normal stomach.                       - Benign-appearing esophageal stenosis. Dilated.                       - Mucosal nodule found in the esophagus. Biopsied.                       - The examination was otherwise normal. Recommendation:       - Discharge patient to home (with escort).                       - Resume previous diet.                       - Continue present medications.                       - Await pathology results.                       - Return to my office in 2 weeks. Procedure Code(s):    --- Professional ---                       [id number], Esophagogastroduodenoscopy, flexible, transoral;                        with transendoscopic balloon dilation of esophagus                        (less than 30 mm diameter)  57505, 48, Esophagogastroduodenoscopy, flexible,                        transoral; with biopsy, single or multiple Diagnosis Code(s):    --- Professional ---                       K22.2, Esophageal obstruction                       K22.8, Other specified diseases of esophagus                       R13.10, Dysphagia, unspecified CPT copyright 2018 American Medical Association. All  rights reserved. The codes documented in this report are preliminary and upon coder review may  be revised to meet current compliance requirements. Jonathon Bellows, MD Jonathon Bellows MD, MD 07/16/2018 2:47:17 PM This report has been signed electronically. Number of Addenda: 0 Note Initiated On: 07/16/2018 2:26 PM      [facility]

## 2018-07-16 NOTE — Transfer of Care (Signed)
Immediate Anesthesia Transfer of Care Note  Patient: [name]  Procedure(s) Performed: ESOPHAGOGASTRODUODENOSCOPY (EGD) WITH PROPOFOL (N/A )  Patient Location: PACU  Anesthesia Type:General  Level of Consciousness: drowsy  Airway & Oxygen Therapy: Patient Spontanous Breathing and Patient connected to nasal cannula oxygen  Post-op Assessment: Report given to RN and Post -op Vital signs reviewed and stable  Post vital signs: Reviewed and stable  Last Vitals:  Vitals Value Taken Time  BP    Temp    Pulse    Resp    SpO2      Last Pain:  Vitals:   07/16/18 1343  TempSrc: Tympanic  PainSc: 0-No pain         Complications: No apparent anesthesia complications

## 2018-07-16 NOTE — Anesthesia Preprocedure Evaluation (Addendum)
Anesthesia Evaluation  Patient identified by MRN, date of birth, ID band Patient awake    Reviewed: Allergy & Precautions, H&P , NPO status , reviewed documented beta blocker date and time   Airway Mallampati: II  TM Distance: >3 FB Neck ROM: full    Dental  (+) Upper Dentures, Edentulous Lower   Pulmonary    Pulmonary exam normal        Cardiovascular hypertension, + CAD  Normal cardiovascular exam     Neuro/Psych PSYCHIATRIC DISORDERS Anxiety Depression Dementia  Neuromuscular disease    GI/Hepatic GERD  ,  Endo/Other  diabetes  Renal/GU      Musculoskeletal  (+) Arthritis , Fibromyalgia -  Abdominal   Peds  Hematology   Anesthesia Other Findings Past Medical History: No date: Anxiety and depression 2015: Breast cancer ([place])     Comment:  LT LUMPECTOMY No date: Carpal tunnel syndrome No date: Chest pain     Comment:  SECONDARY TO GERD No date: Chronic interstitial cystitis No date: Chronic right hip pain No date: Depression No date: Diabetes mellitus without complication (HCC) No date: Diverticulosis No date: Dyslipidemia No date: Essential hypertension, benign No date: Fibromyalgia No date: Frequent falls No date: GERD (gastroesophageal reflux disease) No date: Hearing loss of left ear No date: History of fractured rib No date: History of TIA (transient ischemic attack) No date: Irritable bowel 10/04/2016: Lumbar arthropathy     Comment:  On CT scan, refer to ortho No date: MS (multiple sclerosis) ([place]) No date: Osteoarthritis No date: Paroxysmal supraventricular tachycardia ([place]) No date: Peripheral neuropathy     Comment:  MILD 2015: Personal history of radiation therapy     Comment:  BREAST CA No date: Protein malnutrition ([place]) No date: Pure hypercholesterolemia No date: Recurrent UTI No date: Skin cancer No date: SOB (shortness of breath)     Comment:  SECONDARY TO REFLUX 12/04/2015:  Solitary pulmonary nodule on lung CT     Comment:  3 mm LLL lung nodule June 2016, March 2017 No date: Tachycardia, paroxysmal (HCC)     Comment:  Reportedly Paroxysmal Supraventricular Tachycardia, but               unable to find documentation confirming this 01/11/2017: Thickening of esophagus     Comment:  Noted on CT scan June 2018 No date: Thyroid nodule No date: Vertigo No date: Vitamin B 12 deficiency  Past Surgical History: No date: APPENDECTOMY 1975: AUGMENTATION MAMMAPLASTY; Right     Comment:  BREAST BREAST ONLY No date: BLADDER TACKING     Comment:  X 3 2015: BREAST BIOPSY; Left 2015: BREAST LUMPECTOMY; Left No date: BREAST SURGERY 10/2001: CARDIAC CATHETERIZATION     Comment:  Normal coronary arteries with the exception of 20%               proximal D1 No date: CHOLECYSTECTOMY 01/21/2015: COLONOSCOPY WITH PROPOFOL; N/A     Comment:  Procedure: COLONOSCOPY WITH PROPOFOL;  Surgeon: Josefine Class, MD;  Location: [facility] ENDOSCOPY;  Service:               Endoscopy;  Laterality: N/A; 01/21/2015: ESOPHAGOGASTRODUODENOSCOPY; N/A     Comment:  Procedure: ESOPHAGOGASTRODUODENOSCOPY (EGD);  Surgeon:               Josefine Class, MD;  Location: [facility] ENDOSCOPY;  Service: Endoscopy;  Laterality: N/A; 04/01/2017: ESOPHAGOGASTRODUODENOSCOPY (EGD) WITH PROPOFOL; N/A     Comment:  Procedure: ESOPHAGOGASTRODUODENOSCOPY (EGD) WITH               PROPOFOL WITH BANDING;  Surgeon: Jonathon Bellows, MD;                Location: [facility] ENDOSCOPY;  Service: Gastroenterology;                Laterality: N/A; No date: EYE SURGERIES     Comment:  WITH BUCKLE DETACHMENT OF THE RETINA 06/05/2017: FLEXIBLE SIGMOIDOSCOPY; N/A     Comment:  Procedure: FLEXIBLE SIGMOIDOSCOPY WITHOUT SEDATION;                Surgeon: Jonathon Bellows, MD;  Location: [facility] ENDOSCOPY;                Service: Gastroenterology;  Laterality: N/A; No date: heart monitor     Comment:  Not being  used... Patient can not have a MRI No date: HEMORRHOID SURGERY     Comment:  WITH RECONSTRUCTION 1970: MASTECTOMY; Right     Comment:  SUBCUTANEOUS - NO BREAST CANCER No date: TONSILLECTOMY No date: TOTAL ABDOMINAL HYSTERECTOMY W/ BILATERAL SALPINGOOPHORECTOMY No date: TYMPANOPLASTY     Reproductive/Obstetrics                             Anesthesia Physical Anesthesia Plan  ASA: III  Anesthesia Plan: General   Post-op Pain Management:    Induction: Intravenous  PONV Risk Score and Plan: 3 and Treatment may vary due to age or medical condition and TIVA  Airway Management Planned: Nasal Cannula and Natural Airway  Additional Equipment:   Intra-op Plan:   Post-operative Plan:   Informed Consent: I have reviewed the patients History and Physical, chart, labs and discussed the procedure including the risks, benefits and alternatives for the proposed anesthesia with the patient or authorized representative who has indicated his/her understanding and acceptance.   Dental Advisory Given  Plan Discussed with: CRNA  Anesthesia Plan Comments:         Anesthesia Quick Evaluation

## 2018-07-16 NOTE — Anesthesia Post-op Follow-up Note (Signed)
Anesthesia QCDR form completed.        

## 2018-07-16 NOTE — H&P (Signed)
Jonathon Bellows, MD [address] [place], [place], [place], [address] [address], [place], [place], [place], [address] Phone: [phone]  Fax: [phone]  Primary Care Physician:  Arnetha Courser, MD   Pre-Procedure History & Physical: HPI:  [name] is a 84 y.o. female is here for an endoscopy    Past Medical History:  Diagnosis Date  . Anxiety and depression   . Breast cancer ([place]) 2015   LT LUMPECTOMY  . Carpal tunnel syndrome   . Chest pain    SECONDARY TO GERD  . Chronic interstitial cystitis   . Chronic right hip pain   . Depression   . Diverticulosis   . Dyslipidemia   . Essential hypertension, benign   . Fibromyalgia   . Frequent falls   . GERD (gastroesophageal reflux disease)   . Hearing loss of left ear   . History of fractured rib   . History of TIA (transient ischemic attack)   . Irritable bowel   . Lumbar arthropathy 10/04/2016   On CT scan, refer to ortho  . MS (multiple sclerosis) ([place])   . Osteoarthritis   . Paroxysmal supraventricular tachycardia ([place])   . Peripheral neuropathy    MILD  . Personal history of radiation therapy 2015   BREAST CA  . Protein malnutrition ([place])   . Pure hypercholesterolemia   . Recurrent UTI   . Skin cancer   . SOB (shortness of breath)    SECONDARY TO REFLUX  . Solitary pulmonary nodule on lung CT 12/04/2015   3 mm LLL lung nodule June 2016, March 2017  . Tachycardia, paroxysmal (HCC)    Reportedly Paroxysmal Supraventricular Tachycardia, but unable to find documentation confirming this  . Thickening of esophagus 01/11/2017   Noted on CT scan June 2018  . Thyroid nodule   . Vertigo   . Vitamin B 12 deficiency     Past Surgical History:  Procedure Laterality Date  . APPENDECTOMY    . AUGMENTATION MAMMAPLASTY Right 1975   BREAST BREAST ONLY  . BLADDER TACKING     X 3  . BREAST BIOPSY Left 2015  . BREAST LUMPECTOMY Left 2015  . BREAST SURGERY    . CARDIAC CATHETERIZATION  10/2001   Normal  coronary arteries with the exception of 20% proximal D1  . CHOLECYSTECTOMY    . COLONOSCOPY WITH PROPOFOL N/A 01/21/2015   Procedure: COLONOSCOPY WITH PROPOFOL;  Surgeon: Josefine Class, MD;  Location: [facility] ENDOSCOPY;  Service: Endoscopy;  Laterality: N/A;  . ESOPHAGOGASTRODUODENOSCOPY N/A 01/21/2015   Procedure: ESOPHAGOGASTRODUODENOSCOPY (EGD);  Surgeon: Josefine Class, MD;  Location: [facility] ENDOSCOPY;  Service: Endoscopy;  Laterality: N/A;  . ESOPHAGOGASTRODUODENOSCOPY (EGD) WITH PROPOFOL N/A 04/01/2017   Procedure: ESOPHAGOGASTRODUODENOSCOPY (EGD) WITH PROPOFOL WITH BANDING;  Surgeon: Jonathon Bellows, MD;  Location: [facility] ENDOSCOPY;  Service: Gastroenterology;  Laterality: N/A;  . EYE SURGERIES     WITH BUCKLE DETACHMENT OF THE RETINA  . FLEXIBLE SIGMOIDOSCOPY N/A 06/05/2017   Procedure: FLEXIBLE SIGMOIDOSCOPY WITHOUT SEDATION;  Surgeon: Jonathon Bellows, MD;  Location: [facility] ENDOSCOPY;  Service: Gastroenterology;  Laterality: N/A;  . heart monitor     Not being used... Patient can not have a MRI  . HEMORRHOID SURGERY     WITH RECONSTRUCTION  . MASTECTOMY Right 1970   SUBCUTANEOUS - NO BREAST CANCER  . TONSILLECTOMY    . TOTAL ABDOMINAL HYSTERECTOMY W/ BILATERAL SALPINGOOPHORECTOMY    . TYMPANOPLASTY      Prior to Admission  medications   Medication Sig Start Date End Date Taking? Authorizing Provider  amLODipine (NORVASC) 2.5 MG tablet TAKE 1 TABLET (2.5 MG TOTAL) BY MOUTH DAILY. 10/01/17  Yes Lada, Satira Anis, MD  atenolol (TENORMIN) 25 MG tablet Take by mouth daily.   Yes [provider]  atorvastatin (LIPITOR) 40 MG tablet TAKE 1 TABLET BY MOUTH EVERYDAY AT BEDTIME 02/22/18  Yes Lada, Satira Anis, MD  Carboxymethylcell-Hypromellose (GENTEAL OP) Apply 1 drop to eye. Each eye as needed   Yes [provider]  cephALEXin (KEFLEX) 500 MG capsule Take 1 capsule (500 mg total) by mouth 4 (four) times daily for 10 days. 07/08/18 07/18/18 Yes Nena Polio, MD  DULoxetine  (CYMBALTA) 30 MG capsule Take 30 mg by mouth daily. 12/19/17 12/19/18 Yes [provider]  loratadine (CLARITIN) 10 MG tablet Take 10 mg by mouth daily.   Yes [provider]  Loratadine 10 MG CAPS Take 10 mg by mouth daily.  03/07/17  Yes [provider]  meclizine (ANTIVERT) 25 MG tablet TAKE 1 TABLET (25 MG TOTAL) BY MOUTH EVERY 8 (EIGHT) HOURS AS NEEDED FOR DIZZINESS. 05/30/18  Yes Lada, Satira Anis, MD  memantine (NAMENDA) 10 MG tablet Take 10 mg by mouth 2 (two) times daily.  08/11/16  Yes [provider]  omeprazole (PRILOSEC) 20 MG capsule TAKE 1 CAPSULE BY MOUTH EVERY DAY 11/25/17  Yes Jonathon Bellows, MD  oxyCODONE-acetaminophen (PERCOCET) 5-325 MG tablet Take 0.5 tablets by mouth every 4 (four) hours as needed for moderate pain or severe pain. 01/02/18 01/02/19 Yes Delman Kitten, MD  oxyCODONE-acetaminophen (PERCOCET) 5-325 MG tablet Take 1 tablet by mouth every 4 (four) hours as needed for severe pain. 07/08/18 07/08/19 Yes Nena Polio, MD  pioglitazone (ACTOS) 15 MG tablet Take 1 tablet (15 mg total) by mouth daily. For diabetes 02/13/18  Yes Lada, Satira Anis, MD  acetaminophen (TYLENOL) 500 MG tablet Take 500 mg by mouth every 8 (eight) hours as needed.     [provider]  aspirin 81 MG tablet Take 81 mg by mouth daily.  03/07/17   [provider]  b complex vitamins tablet Take 1 tablet by mouth 2 (two) times a week.     [provider]  cetirizine (ZYRTEC) 10 MG tablet Take 10 mg daily by mouth.     [provider]  Cholecalciferol (VITAMIN D-1000 MAX ST) 1000 units tablet Take 5,000 Units by mouth 2 (two) times a week.     [provider]  Cyanocobalamin (VITAMIN B-12) 1000 MCG SUBL 2 (two) times a week.  09/16/15   [provider]  dicyclomine (BENTYL) 10 MG capsule Take 10 mg as needed by mouth.  01/21/15   [provider]  ferrous sulfate 325 (65 FE) MG tablet Take 325 mg daily by mouth.     [provider]  fluticasone (FLONASE) 50 MCG/ACT nasal spray SPRAY 2 SPRAYS EACH INTO BOTH NOSTRILS ONCE prn 10/19/15   [provider]  Incontinence Supply Disposable (DEPEND UNDERWEAR LARGE) MISC  06/10/17   [provider]  LACTOBACILLUS PO Take daily by mouth.     [provider]  methylPREDNISolone (MEDROL DOSEPAK) 4 MG TBPK tablet TAKE 6 TABLETS ON DAY 1 AS DIRECTED ON PACKAGE AND DECREASE BY 1 TAB EACH DAY FOR A TOTAL OF 6 DAYS 06/14/17   [provider]  neomycin-polymyxin b-dexamethasone (MAXITROL) 3.5-10000-0.1 OINT PLACE SMALL RIBBON IN BOTH EYES 2 TIMES A DAY FOR 14 DAYS  02/28/17   [provider]  ondansetron (ZOFRAN) 4 MG tablet Take 1 tablet (4 mg total) by mouth daily as needed for nausea or vomiting. Patient not taking: Reported on 05/16/2018 01/02/18 01/02/19  Delman Kitten, MD  polyethylene glycol powder [facility]) powder 17 grams daily Patient not taking: Reported on 07/16/2018 04/22/17   Jonathon Bellows, MD  Probiotic Product (PROBIOTIC COLON SUPPORT) CAPS Take 1 capsule by mouth daily.     [provider]  tiZANidine (ZANAFLEX) 2 MG tablet Take 2 mg every 6 (six) hours by mouth. 06/14/17   [provider]    Allergies as of 07/01/2018 - Review Complete 07/01/2018  Allergen Reaction Noted  . Aricept [donepezil hcl] Other (See Comments) 04/27/2016  . Biaxin [clarithromycin] Other (See Comments) and Diarrhea 06/16/2008  . Copaxone [glatiramer acetate]  01/20/2015  . Decadron [dexamethasone] Nausea And Vomiting and Other (See Comments) 01/06/2015  . Dexamethasone sodium phosphate Other (See Comments) 07/20/2015  . Diltiazem hcl Other (See Comments)   . Diltiazem hcl Other (See Comments) 02/21/2015  . Flagyl [metronidazole] Nausea Only and Diarrhea 01/06/2015  . Gabapentin Other (See Comments)   . Iohexol Swelling and Other (See Comments) 08/22/2008  . Ivp dye [iodinated diagnostic agents] Swelling and Other (See  Comments) 01/06/2015  . Levothyroxine Nausea Only 01/13/2014  . Macrobid [nitrofurantoin macrocrystal] Nausea And Vomiting 08/30/2016  . Sulfa antibiotics Other (See Comments) 11/24/2015  . Sulfonamide derivatives Other (See Comments)   . Synthroid [levothyroxine sodium]  01/20/2015  . Copaxone  [glatiramer] Rash 02/21/2015  . Interferon beta-1a Other (See Comments) and Rash     Family History  Problem Relation Age of Onset  . ALS Mother   . Cancer Father        lung  . Aneurysm Brother   . Heart disease Daughter   . Colon cancer Unknown   . Breast cancer Neg Hx   . Bladder Cancer Neg Hx   . Prostate cancer Neg Hx     Social History   Socioeconomic History  . Marital status: Married    Spouse name: Marcello Moores  . Number of children: 4  . Years of education: Not on file  . Highest education level: 12th grade  Occupational History  . Occupation: RETIRED    Employer: RETIRED    Comment: [employer]  Social Needs  . Financial resource strain: Not hard at all  . Food insecurity:    Worry: Never true    Inability: Never true  . Transportation needs:    Medical: No    Non-medical: No  Tobacco Use  . Smoking status: Never Smoker  . Smokeless tobacco: Never Used  . Tobacco comment: smoking cessation materials not required  Substance and Sexual Activity  . Alcohol use: No    Alcohol/week: 0.0 standard drinks  . Drug use: No  . Sexual activity: Never  Lifestyle  . Physical activity:    Days per week: 0 days    Minutes per session: 0 min  . Stress: Not at all  Relationships  . Social connections:    Talks on phone: Patient refused    Gets together: Patient refused    Attends religious service: Patient refused    Active member of club or organization: Patient refused    Attends meetings of clubs or organizations: Patient refused    Relationship status: Married  . Intimate partner violence:    Fear of current or ex partner: No    Emotionally abused: No  Physically abused: No    Forced sexual activity: No  Other Topics Concern  . Not on file  Social History Narrative   1 SON, 3 DAUGHTER'S   15 GRANDCHILDREN   LIVING AT [facility]    Review of Systems: See HPI, otherwise negative ROS  Physical Exam: BP (!) 151/84   Pulse 81   Temp 98.6 F (37 C) (Tympanic)   Resp 14   Ht 5\' 2"  (1.575 m)   Wt 72.6 kg   SpO2 98%   BMI 29.26 kg/m  General:   Alert,  pleasant and cooperative in NAD Head:  Normocephalic and atraumatic. Neck:  Supple; no masses or thyromegaly. Lungs:  Clear throughout to auscultation, normal respiratory effort.    Heart:  +S1, +S2, Regular rate and rhythm, No edema. Abdomen:  Soft, nontender and nondistended. Normal bowel sounds, without guarding, and without rebound.   Neurologic:  Alert and  oriented x4;  grossly normal neurologically.  Impression/Plan: [name] is here for an endoscopy  to be performed for  evaluation of dysphagia    Risks, benefits, limitations, and alternatives regarding endoscopy and dilation have been reviewed with the patient.  Questions have been answered.  All parties agreeable.   Jonathon Bellows, MD  07/16/2018, 2:37 PM

## 2018-07-17 ENCOUNTER — Encounter: Payer: Self-pay | Admitting: Gastroenterology

## 2018-07-18 LAB — SURGICAL PATHOLOGY

## 2018-07-20 ENCOUNTER — Encounter: Payer: Self-pay | Admitting: Gastroenterology

## 2018-07-24 NOTE — Anesthesia Postprocedure Evaluation (Signed)
Anesthesia Post Note  Patient: DAVELYN GWINN  Procedure(s) Performed: ESOPHAGOGASTRODUODENOSCOPY (EGD) WITH PROPOFOL (N/A )  Patient location during evaluation: Endoscopy Anesthesia Type: General Level of consciousness: awake and alert Pain management: pain level controlled Vital Signs Assessment: post-procedure vital signs reviewed and stable Respiratory status: spontaneous breathing, nonlabored ventilation and respiratory function stable Cardiovascular status: blood pressure returned to baseline and stable Postop Assessment: no apparent nausea or vomiting Anesthetic complications: no     Last Vitals:  Vitals:   07/16/18 1504 07/16/18 1516  BP: 119/70 125/63  Pulse: 72 71  Resp: (!) 22 14  Temp:    SpO2: 99% 100%    Last Pain:  Vitals:   07/16/18 1516  TempSrc:   PainSc: 0-No pain                 Alphonsus Sias

## 2018-07-28 ENCOUNTER — Encounter: Payer: Self-pay | Admitting: Family Medicine

## 2018-07-28 ENCOUNTER — Ambulatory Visit (INDEPENDENT_AMBULATORY_CARE_PROVIDER_SITE_OTHER): Payer: Medicare HMO | Admitting: Family Medicine

## 2018-07-28 VITALS — BP 116/72 | HR 87 | Temp 97.9°F | Ht 62.0 in | Wt 161.4 lb

## 2018-07-28 DIAGNOSIS — E1122 Type 2 diabetes mellitus with diabetic chronic kidney disease: Secondary | ICD-10-CM | POA: Diagnosis not present

## 2018-07-28 DIAGNOSIS — Z5181 Encounter for therapeutic drug level monitoring: Secondary | ICD-10-CM

## 2018-07-28 DIAGNOSIS — R296 Repeated falls: Secondary | ICD-10-CM | POA: Diagnosis not present

## 2018-07-28 DIAGNOSIS — E7800 Pure hypercholesterolemia, unspecified: Secondary | ICD-10-CM | POA: Diagnosis not present

## 2018-07-28 DIAGNOSIS — F3341 Major depressive disorder, recurrent, in partial remission: Secondary | ICD-10-CM

## 2018-07-28 DIAGNOSIS — F0390 Unspecified dementia without behavioral disturbance: Secondary | ICD-10-CM

## 2018-07-28 DIAGNOSIS — I10 Essential (primary) hypertension: Secondary | ICD-10-CM

## 2018-07-28 DIAGNOSIS — N183 Chronic kidney disease, stage 3 (moderate): Secondary | ICD-10-CM | POA: Diagnosis not present

## 2018-07-28 DIAGNOSIS — M1990 Unspecified osteoarthritis, unspecified site: Secondary | ICD-10-CM

## 2018-07-28 DIAGNOSIS — E1140 Type 2 diabetes mellitus with diabetic neuropathy, unspecified: Secondary | ICD-10-CM

## 2018-07-28 DIAGNOSIS — G35 Multiple sclerosis: Secondary | ICD-10-CM | POA: Diagnosis not present

## 2018-07-28 DIAGNOSIS — E1149 Type 2 diabetes mellitus with other diabetic neurological complication: Secondary | ICD-10-CM | POA: Diagnosis not present

## 2018-07-28 DIAGNOSIS — M8580 Other specified disorders of bone density and structure, unspecified site: Secondary | ICD-10-CM

## 2018-07-28 NOTE — Assessment & Plan Note (Signed)
Check liver and kidneys 

## 2018-07-28 NOTE — Assessment & Plan Note (Signed)
Discussed; they will talk with Dr. Melrose Nakayama, prescriber of the SNRI, about increasing the dose from 30 mg to perhaps 40 mg daily or even 60 mg daily

## 2018-07-28 NOTE — Assessment & Plan Note (Signed)
Will have patient try turmeric and CoQ-10 (the latter in case statin is causing symptoms)

## 2018-07-28 NOTE — Assessment & Plan Note (Signed)
Check lipids; encouraged healthier eating

## 2018-07-28 NOTE — Assessment & Plan Note (Signed)
Under the care of neurologist  ?

## 2018-07-28 NOTE — Patient Instructions (Addendum)
Consider talking to Dr. Melrose Nakayama about increasing your Cymbalta; that would be okay with me  Take CoQ-10 daily to see if that helps with aches and pain Try turmeric as a natural anti-inflammatory (for pain and arthritis). It comes in capsules where you buy aspirin and fish oil, but also as a spice where you buy pepper and garlic powder.  We'll get labs  Try to eat healthier and work on modest weight loss
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
## 2018-07-28 NOTE — Assessment & Plan Note (Signed)
Fall precautions; calcium intake; reviewed last DEXA report with her

## 2018-07-28 NOTE — Assessment & Plan Note (Signed)
Controlled today 

## 2018-07-28 NOTE — Assessment & Plan Note (Signed)
Encouraged fall precautions; use of four wheel rolling walker

## 2018-07-29 LAB — COMPLETE METABOLIC PANEL WITH GFR
AG RATIO: 1.6 (calc) (ref 1.0–2.5)
ALT: 14 U/L (ref 6–29)
AST: 10 U/L (ref 10–35)
Albumin: 3.9 g/dL (ref 3.6–5.1)
Alkaline phosphatase (APISO): 43 U/L (ref 33–130)
BUN/Creatinine Ratio: 20 (calc) (ref 6–22)
BUN: 26 mg/dL — ABNORMAL HIGH (ref 7–25)
CO2: 26 mmol/L (ref 20–32)
Calcium: 9.4 mg/dL (ref 8.6–10.4)
Chloride: 107 mmol/L (ref 98–110)
Creat: 1.27 mg/dL — ABNORMAL HIGH (ref 0.60–0.88)
GFR, Est African American: 44 mL/min/{1.73_m2} — ABNORMAL LOW (ref >=60)
GFR, Est Non African American: 38 mL/min/{1.73_m2} — ABNORMAL LOW (ref >=60)
GLOBULIN: 2.5 g/dL (ref 1.9–3.7)
Glucose, Bld: 100 mg/dL — ABNORMAL HIGH (ref 65–99)
Potassium: 4.6 mmol/L (ref 3.5–5.3)
SODIUM: 143 mmol/L (ref 135–146)
Total Bilirubin: 0.5 mg/dL (ref 0.2–1.2)
Total Protein: 6.4 g/dL (ref 6.1–8.1)

## 2018-07-29 LAB — LIPID PANEL
Cholesterol: 154 mg/dL (ref <200)
HDL: 75 mg/dL (ref >50)
LDL CHOLESTEROL (CALC): 63 mg/dL
Non-HDL Cholesterol (Calc): 79 mg/dL (calc) (ref <130)
Total CHOL/HDL Ratio: 2.1 (calc) (ref <5.0)
Triglycerides: 82 mg/dL (ref <150)

## 2018-07-29 LAB — HEMOGLOBIN A1C
Hgb A1c MFr Bld: 7.1 % of total Hgb — ABNORMAL HIGH (ref <5.7)
Mean Plasma Glucose: 157 (calc)
eAG (mmol/L): 8.7 (calc)

## 2018-07-31 DIAGNOSIS — L728 Other follicular cysts of the skin and subcutaneous tissue: Secondary | ICD-10-CM | POA: Diagnosis not present

## 2018-07-31 DIAGNOSIS — H90A32 Mixed conductive and sensorineural hearing loss, unilateral, left ear with restricted hearing on the contralateral side: Secondary | ICD-10-CM | POA: Diagnosis not present

## 2018-07-31 DIAGNOSIS — H9210 Otorrhea, unspecified ear: Secondary | ICD-10-CM | POA: Diagnosis not present

## 2018-07-31 DIAGNOSIS — L720 Epidermal cyst: Secondary | ICD-10-CM | POA: Diagnosis not present

## 2018-07-31 DIAGNOSIS — L821 Other seborrheic keratosis: Secondary | ICD-10-CM | POA: Diagnosis not present

## 2018-07-31 NOTE — Progress Notes (Signed)
[name], please let the patient know that her cholesterol has really improved! I'm quite impressed; she brought her LDL down from 112 to 63. That's great. Her 3 month blood sugar average has also improved a lot; her diabetes is almost to goal; I think just a little more weight loss and healthy eating ought to get her there Her kidney function is about what it was last time, in stage 3 CKD; avoid NSAIDs; try to be a good water drinker, staying hydrated to keep urine very pale yellow; does she see a kidney doctor? If she doesn't, please REFER to nephrologist; her GFR is 38, and if it drops below 30, that will put her in stage 4 CKD

## 2018-08-10 ENCOUNTER — Other Ambulatory Visit: Payer: Self-pay | Admitting: Family Medicine

## 2018-08-11 NOTE — Telephone Encounter (Signed)
Lab Results  Component Value Date   HGBA1C 7.1 (H) 07/28/2018

## 2018-08-19 ENCOUNTER — Telehealth: Payer: Self-pay | Admitting: Family Medicine

## 2018-08-19 NOTE — Telephone Encounter (Signed)
Copied from [place] [id number]. Topic: Quick Communication - See Telephone Encounter >> Aug 19, 2018  8:55 AM Ahmed Prima L wrote: CRM for notification. See Telephone encounter for: 08/19/18.  Patient's daughter, [name] called and stated that she is not doing to well right now because her husband is in hospice. She would like to know if Dr Sanda Klein would give her a small dosage of xanax, she currently uses [facility], [place] [address] [place] [id number]

## 2018-08-19 NOTE — Telephone Encounter (Signed)
Left detailed voicemail with [name]

## 2018-08-19 NOTE — Telephone Encounter (Signed)
We're so sorry that she is having a difficult time right now Please let her know she and her whole family are in our thoughts  I actually don't want to give her Xanax because she already has multiple medicines in her list that can cause drowsiness, confusion, and falls in the elderly (cetirizine, loratidine 2x, dicyclomine, meclizine, ondansetron, tizanidine)  Please clean up her med list and remove one of the loratidine listing (both capsules and tablets are listed)  We'll encourage her to work with a therapist and Hospice should have someone that she can speak to and help her get her through this difficult time

## 2018-08-21 ENCOUNTER — Ambulatory Visit: Payer: Medicare HMO | Admitting: Family Medicine

## 2018-08-21 MED ORDER — LORAZEPAM 0.5 MG PO TABS: 0.2500 mg | ORAL_TABLET | Freq: Three times a day (TID) | ORAL | 0 refills | Status: AC | PRN

## 2018-08-21 NOTE — Telephone Encounter (Signed)
Patient's daughter is very upset, she states mom needs something even if short term or she is going to have nervous breakdown.  She is sitting bedside watching her husband die.  Daughter states will watch mom very closely if meds given.  Please call daughter personally and explain if you will not give med.

## 2018-08-21 NOTE — Telephone Encounter (Signed)
Pt requesting a call back from [place] regarding her "nervous condition."

## 2018-08-21 NOTE — Telephone Encounter (Signed)
Daughter agree's and as we were talking she took the bottles away and wrote down the meds

## 2018-08-21 NOTE — Addendum Note (Signed)
Addended by: LADA, Satira Anis on: 08/21/2018 02:57 PM   Modules accepted: Orders

## 2018-08-21 NOTE — Telephone Encounter (Signed)
If I give her something, she needs to STOP all of the following while she is taking it; not just at the same time but during the whole course, none of these for a few weeks 1. Cetirizine 2. Loratidine 3. Dicyclomine 4. Meclizine 5. Ondansetron 6. Tizanidine  Make sure daughter writes all of these down so she knows they can't be given with the new medicine  Her mother could overdose, fall, break a hip, get a head bleed, or other serious complication  If the daughter will promise to get these out of her medicine cabinet or pill dispenser, however they have her pills laid out, then I will give a very low dose anxiety pill  Back to me once she agrees for prescription

## 2018-08-21 NOTE — Telephone Encounter (Signed)
Rx sent 

## 2018-08-25 NOTE — Progress Notes (Signed)
08/26/2018  5:11 PM   [name] Jul 17, 1930 [id number]  Referring provider: Arnetha Courser, MD [address] [place], [place] [address]  Chief Complaint  Patient presents with  . Interstitial cystitis    HPI: [name] is a 85 y.o. female Caucasian with a history of urethral diverticulum, incontinence, recurrent UTI and IC, UVJ stones, and DM who presents today for follow up on her recent hospitalization for a kidney stone and bladder infection with her daughter, [name].    Background history Patient is well-known to wire practice with history of urethral diverticulum, incontinence, recurrent UTI and interstitial cystitis who presented with a flare of pain specifically in the right lower quadrant. A CT scan of the abdomen and pelvis was obtained on 01/09/2017.  This showed moderate right hydroureteronephrosis down to a 1-2 mm right UVJ stone that was just about in the bladder. She had the appearance of bilateral extrarenal pelves. Her UA showed 6-30 red cells, rare bacteria. Creatinine was 1.18. This was rechecked and found to be stable and slightly lower at 1.11. She was given percocet +/- ibuprofen. She hasnt seen a stone pass and still has RLQ pain. She has never had kidney stones. The urethral tic looked stable. She is staying hydrated with "water". Hasn't needed pain meds. She also has urgency, UUI and dysuria, some of which is chronic.    RUS completed on 01/18/2017 which noted an interval resolution of right-sided hydronephrosis.  Bilateral renal cysts.  A benign-appearing left splenic cyst measures 3.9 cm maximally, not significantly changed from prior imaging.  On 02-12-17, she felt she has passed stone but was unable to capture the stone.  She was experiencing urgency, nocturia and incontinence. These were baseline symptoms. She also suffered with diarrhea and constipation which are also base line.  She denied any fevers, chills, nausea or vomiting.  Her UA  demonstrated moderate bacteria; this was a cath specimen.   She was complaining of dysuria.  She was still taking the Trimethoprim and using the vaginal cream three nights weekly.  On 07/08/2018, she was admitted to the emergency room at [facility] with abdominal pain; a Ct Renal Stone Study found a 3 mm obstructive stone at the right UVJ with secondary mild to moderate right hydroureteronephrosis.  Additional punctate 3 mm nonobstructive stones with bilateral renal atrophy and probable bilateral renal cysts, grossly stable.  Colonic diverticulosis without evidence for acute diverticulitis.   UA at that visit was positive for > 50 RBC's and 6-10 WBC's.  Urine culture was negative.  Serum creatinine 1.26.  WBC count was 7.6.    Her urinalysis on 08/26/2018 found moderate bacteria, presp mucus threads, BIL 1+, BLO trace-intact, LEU trace.  On 08/26/2018 visit, patient admitted to right lower right quadrant pain but it may be at least in part due to stress as she was widowed on Saturday.  Patient admits to back pain.  Lower right quadrant pain is throbbing/throbbing and tender, and got significantly worse yesterday.  Patient admits to diarrhea yesterday.  Patient does admit fever/chills; daughter says she does not take her temperature.  Patient is tender on lower left quadrant.  Patient did report excessive urination.  Patient admitted 'water on the brain.'  Patient reports nausea from pain, appetite is mild.   PMH: Past Medical History:  Diagnosis Date  . Anxiety and depression   . Breast cancer ([facility]) 2015   LT LUMPECTOMY  . Carpal tunnel syndrome   .  Chest pain    SECONDARY TO GERD  . Chronic interstitial cystitis   . Chronic right hip pain   . Depression   . Diverticulosis   . Dyslipidemia   . Essential hypertension, benign   . Fibromyalgia   . Frequent falls   . GERD (gastroesophageal reflux disease)   . Hearing loss of left ear   . History of fractured rib   .  History of TIA (transient ischemic attack)   . Irritable bowel   . Lumbar arthropathy 10/04/2016   On CT scan, refer to ortho  . MS (multiple sclerosis) ([facility])   . Osteoarthritis   . Paroxysmal supraventricular tachycardia ([facility])   . Peripheral neuropathy    MILD  . Personal history of radiation therapy 2015   BREAST CA  . Protein malnutrition ([facility])   . Pure hypercholesterolemia   . Recurrent UTI   . Skin cancer   . SOB (shortness of breath)    SECONDARY TO REFLUX  . Solitary pulmonary nodule on lung CT 12/04/2015   3 mm LLL lung nodule June 2016, March 2017  . Tachycardia, paroxysmal ([facility])    Reportedly Paroxysmal Supraventricular Tachycardia, but unable to find documentation confirming this  . Thickening of esophagus 01/11/2017   Noted on CT scan June 2018  . Thyroid nodule   . Vertigo   . Vitamin B 12 deficiency     Surgical History: Past Surgical History:  Procedure Laterality Date  . APPENDECTOMY    . AUGMENTATION MAMMAPLASTY Right 1975   BREAST BREAST ONLY  . BLADDER TACKING     X 3  . BREAST BIOPSY Left 2015  . BREAST LUMPECTOMY Left 2015  . BREAST SURGERY    . CARDIAC CATHETERIZATION  10/2001   Normal coronary arteries with the exception of 20% proximal D1  . CHOLECYSTECTOMY    . COLONOSCOPY WITH PROPOFOL N/A 01/21/2015   Procedure: COLONOSCOPY WITH PROPOFOL;  Surgeon: Josefine Class, MD;  Location: [facility] ENDOSCOPY;  Service: Endoscopy;  Laterality: N/A;  . ESOPHAGOGASTRODUODENOSCOPY N/A 01/21/2015   Procedure: ESOPHAGOGASTRODUODENOSCOPY (EGD);  Surgeon: Josefine Class, MD;  Location[facility] ENDOSCOPY;  Service: Endoscopy;  Laterality: N/A;  . ESOPHAGOGASTRODUODENOSCOPY (EGD) WITH PROPOFOL N/A 04/01/2017   Procedure: ESOPHAGOGASTRODUODENOSCOPY (EGD) WITH PROPOFOL WITH BANDING;  Surgeon: Jonathon Bellows, MD;  Location: [facility] ENDOSCOPY;  Service: Gastroenterology;  Laterality: N/A;  . ESOPHAGOGASTRODUODENOSCOPY (EGD) WITH PROPOFOL N/A 07/16/2018   Procedure:  ESOPHAGOGASTRODUODENOSCOPY (EGD) WITH PROPOFOL;  Surgeon: Jonathon Bellows, MD;  Location: [facility] END[facility]COPY;  Service: Gastroenterology;  Laterality: N/A;  . EYE SURGERIES     WITH BUCKLE DETACHMENT OF THE RETINA  . FLEXIBLE SIGMOIDOSCOPY N/A 06/05/2017   Procedure: FLEXIBLE SIGMOIDOSCOPY WITHOUT SEDATION;  Surgeon: Jonathon Bellows, MD;  Location: [facility] ENDOSCOPY;  Service: Gastroenterology;  Laterality: N/A;  . heart monitor     Not being used... Patient can not have a MRI  . HEMORRHOID SURGERY     WITH RECONSTRUCTION  . MASTECTOMY Right 1970   SUBCUTANEOUS - NO BREAST CANCER  . TONSILLECTOMY    . TOTAL ABDOMINAL HYSTERECTOMY W/ BILATERAL SALPINGOOPHORECTOMY    . TYMPANOPLASTY      Home Medications:  Allergies as of 08/26/2018      Reactions   Aricept [donepezil Hcl] Other (See Comments)   hallucinations   Biaxin [clarithromycin] Other (See Comments), Diarrhea   Copaxone [glatiramer Acetate]    Decadron [dexamethasone] Nausea And Vomiting, Other (See Comments)   Other reaction(s): Muscle Pain Reaction:  Abdominal pain   Dexamethasone  Sodium Phosphate Other (See Comments)   Diltiazem Hcl Other (See Comments)   Reaction:  Unknown    Diltiazem Hcl Other (See Comments)   Flagyl [metronidazole] Nausea Only, Diarrhea   Other reaction(s): Vomiting   Gabapentin Other (See Comments)   "burning all over" Reaction:  Unknown    Iohexol Swelling, Other (See Comments)    Desc: tongue swelling with "IVP dye" sccording to nurses notes with a lumbar myelo-12/09- asm, Onset Date: [id number]  Pts tongue swells.   Ivp Dye [iodinated Diagnostic Agents] Swelling, Other (See Comments)   Passed out  Pts tongue swells.    Levothyroxine Nausea Only   Macrobid [nitrofurantoin Macrocrystal] Nausea And Vomiting   Sulfa Antibiotics Other (See Comments)   Reaction:  Unknown  Reaction:  Unknown    Sulfonamide Derivatives Other (See Comments)   Reaction:  Unknown    Synthroid [levothyroxine Sodium]    Copaxone   [glatiramer] Rash   Interferon Beta-1a Other (See Comments), Rash   Reaction:  Unknown       Medication List       Accurate as of August 26, 2018  5:11 PM. Always use your most recent med list.        acetaminophen 500 MG tablet Commonly known as:  TYLENOL Take 500 mg by mouth every 8 (eight) hours as needed.   amLODipine 2.5 MG tablet Commonly known as:  NORVASC TAKE 1 TABLET (2.5 MG TOTAL) BY MOUTH DAILY.   aspirin 81 MG tablet Take 81 mg by mouth daily.   atenolol 25 MG tablet Commonly known as:  TENORMIN Take by mouth daily.   atorvastatin 40 MG tablet Commonly known as:  LIPITOR TAKE 1 TABLET BY MOUTH EVERYDAY AT BEDTIME   B-12 PO Take by mouth daily.   DEPEND UNDERWEAR LARGE Misc   dicyclomine 10 MG capsule Commonly known as:  BENTYL Take 10 mg as needed by mouth.   DULoxetine 60 MG capsule Commonly known as:  CYMBALTA Take 60 mg by mouth daily.   ferrous sulfate 325 (65 FE) MG tablet Take 325 mg daily by mouth.   fluticasone 50 MCG/ACT nasal spray Commonly known as:  FLONASE SPRAY 2 SPRAYS EACH INTO BOTH NOSTRILS ONCE prn   LACTOBACILLUS PO Take daily by mouth.   loratadine 10 MG tablet Commonly known as:  CLARITIN Take 10 mg by mouth daily.   LORazepam 0.5 MG tablet Commonly known as:  ATIVAN Take 0.5 tablets (0.25 mg total) by mouth every 8 (eight) hours as needed for anxiety. May cause falls, somnolence   meclizine 25 MG tablet Commonly known as:  ANTIVERT TAKE 1 TABLET (25 MG TOTAL) BY MOUTH EVERY 8 (EIGHT) HOURS AS NEEDED FOR DIZZINESS.   memantine 10 MG tablet Commonly known as:  NAMENDA Take 10 mg by mouth 2 (two) times daily.   neomycin-polymyxin b-dexamethasone 3.5-10000-0.1 Oint Commonly known as:  MAXITROL PLACE SMALL RIBBON IN BOTH EYES 2 TIMES A DAY FOR 14 DAYS   omeprazole 20 MG capsule Commonly known as:  PRILOSEC TAKE 1 CAPSULE BY MOUTH EVERY DAY   ondansetron 4 MG tablet Commonly known as:  ZOFRAN Take 1 tablet  (4 mg total) by mouth daily as needed for nausea or vomiting.   pioglitazone 15 MG tablet Commonly known as:  ACTOS TAKE 1 TABLET (15 MG TOTAL) BY MOUTH DAILY. FOR DIABETES   polyethylene glycol powder powder Commonly known as:  GLYCOLAX/MIRALAX 17 grams daily   PROBIOTIC COLON SUPPORT Caps Take 1 capsule by mouth  daily.   Vitamin B-12 1000 MCG Subl 2 (two) times a week.   VITAMIN D-1000 MAX ST 25 MCG (1000 UT) tablet Generic drug:  Cholecalciferol Take 5,000 Units by mouth 2 (two) times a week.       Allergies:  Allergies  Allergen Reactions  . Aricept [Donepezil Hcl] Other (See Comments)    hallucinations  . Biaxin [Clarithromycin] Other (See Comments) and Diarrhea  . Copaxone [Glatiramer Acetate]   . Decadron [Dexamethasone] Nausea And Vomiting and Other (See Comments)    Other reaction(s): Muscle Pain Reaction:  Abdominal pain  . Dexamethasone Sodium Phosphate Other (See Comments)  . Diltiazem Hcl Other (See Comments)    Reaction:  Unknown   . Diltiazem Hcl Other (See Comments)  . Flagyl [Metronidazole] Nausea Only and Diarrhea    Other reaction(s): Vomiting  . Gabapentin Other (See Comments)    "burning all over" Reaction:  Unknown   . Iohexol Swelling and Other (See Comments)     Desc: tongue swelling with "IVP dye" sccording to nurses notes with a lumbar myelo-12/09- asm, Onset Date: [id number]  Pts tongue swells.  Clementeen Hoof [Iodinated Diagnostic Agents] Swelling and Other (See Comments)    Passed out  Pts tongue swells.   . Levothyroxine Nausea Only  . Macrobid [Nitrofurantoin Macrocrystal] Nausea And Vomiting  . Sulfa Antibiotics Other (See Comments)    Reaction:  Unknown  Reaction:  Unknown   . Sulfonamide Derivatives Other (See Comments)    Reaction:  Unknown   . Synthroid [Levothyroxine Sodium]   . Copaxone  [Glatiramer] Rash  . Interferon Beta-1a Other (See Comments) and Rash    Reaction:  Unknown     Family History: Family History  Problem  Relation Age of Onset  . ALS Mother   . Cancer Father        lung  . Aneurysm Brother   . Heart disease Daughter   . Colon cancer Unknown   . Breast cancer Neg Hx   . Bladder Cancer Neg Hx   . Prostate cancer Neg Hx     Social History:  reports that she has never smoked. She has never used smokeless tobacco. She reports that she does not drink alcohol or use drugs.  ROS: UROLOGY Frequent Urination?: Yes Hard to postpone urination?: No Burning/pain with urination?: Yes Get up at night to urinate?: Yes Leakage of urine?: No Urine stream starts and stops?: No Trouble starting stream?: No Do you have to strain to urinate?: No Blood in urine?: No Urinary tract infection?: No Sexually transmitted disease?: No Injury to kidneys or bladder?: No Painful intercourse?: No Weak stream?: No Currently pregnant?: No Vaginal bleeding?: No Last menstrual period?: n  Gastrointestinal Nausea?: No Vomiting?: No Indigestion/heartburn?: No Diarrhea?: Yes Constipation?: Yes  Constitutional Fever: No Night sweats?: No Weight loss?: No Fatigue?: Yes  Skin Skin rash/lesions?: No Itching?: No  Eyes Blurred vision?: Yes Double vision?: No  Ears/Nose/Throat Sore throat?: No Sinus problems?: No  Hematologic/Lymphatic Swollen glands?: No Easy bruising?: No  Cardiovascular Leg swelling?: No Chest pain?: No  Respiratory Cough?: No Shortness of breath?: No  Endocrine Excessive thirst?: No  Musculoskeletal Back pain?: Yes Joint pain?: Yes  Neurological Headaches?: No Dizziness?: Yes  Psychologic Depression?: Yes Anxiety?: Yes  Physical Exam: BP 132/77   Pulse 86   Ht _0  (1.575 m)   Wt 160 lb (72.6 kg)   BMI 29.26 kg/m   Constitutional: Well nourished. Alert and oriented, No acute distress. HEENT: [place]  AT, moist mucus membranes.  Trachea midline, no masses. Cardiovascular: No clubbing, cyanosis, or edema. Respiratory: Normal respiratory effort, no  increased work of breathing. GI: Abdomen is soft, tender diffusely throughout the lower quadrant, non distended, no abdominal masses. Liver and spleen not palpable.  No hernias appreciated.  Stool sample for occult testing is not indicated.  When I palpated the abdomen, she jumped - but she was pressing very firmly with her hands throughout the lower quadrant without pain.   GU: No CVA tenderness.  No bladder fullness or masses.   Skin: No rashes, bruises or suspicious lesions. Lymph: No inguinal adenopathy. Neurologic: Grossly intact, no focal deficits, moving all 4 extremities. Psychiatric: Normal mood and affect.   Laboratory Data: Lab Results  Component Value Date   WBC 7.6 07/08/2018   HGB 13.3 07/08/2018   HCT 41.6 07/08/2018   MCV 97.0 07/08/2018   PLT 183 07/08/2018    Lab Results  Component Value Date   CREATININE 1.27 (H) 07/28/2018    Lab Results  Component Value Date   HGBA1C 7.1 (H) 07/28/2018   Urinalysis (08/26/2018) Moderate bacteria, presp mucus threads.  BIL 1+, BLO trace-intact, LEU trace.  See Epic.   I have reviewed the labs.  Pertinent Imaging:  CLINICAL DATA:  Initial evaluation for acute right-sided flank pain.  EXAM: CT ABDOMEN AND PELVIS WITHOUT CONTRAST  TECHNIQUE: Multidetector CT imaging of the abdomen and pelvis was performed following the standard protocol without IV contrast.  COMPARISON:  Prior CT from 05/13/2018  FINDINGS: Lower chest: Visualized lung bases are clear.  Hepatobiliary: 14 mm cyst present at the superior left hepatic lobe. Liver otherwise unremarkable. Gallbladder surgically absent. No biliary dilatation.  Pancreas: Pancreas within normal limits.  Spleen: 3.8 cm splenic cyst with peripheral calcification is unchanged. Spleen otherwise unremarkable.  Adrenals/Urinary Tract: Adrenal glands within normal limits.  Kidneys atrophic in appearance bilaterally. On the right, there is an obstructive 3 mm stone  at the right UVJ with secondary mild to moderate right hydroureteronephrosis. Prominent right perinephric and periureteral fat stranding. Additional punctate 3 mm nonobstructive stone present within the lower pole of the right kidney. Subcentimeter parenchymal calcification noted.  On the left, no nephrolithiasis or evidence for obstructive uropathy. 17 mm hyperdense lesion likely reflects a proteinaceous and/or hemorrhagic cyst, similar to previous. A few additional small cysts are also unchanged from previous. Bladder largely decompressed. Circumferential bladder wall thickening most likely related to incomplete distension. Superimposed infection could be considered as well.  Stomach/Bowel: Small hiatal hernia. Stomach otherwise unremarkable. No evidence for bowel obstruction. Colonic diverticulosis without evidence for acute diverticulitis. No acute inflammatory changes seen about the bowels.  Vascular/Lymphatic: Mild for age atherosclerotic change within the intra-abdominal aorta. No aneurysm. No adenopathy.  Reproductive: Uterus is absent. Ovaries not discretely identified. Fissural defect at the posterior vagina similar to previous.  Other: No free air or fluid.  Musculoskeletal: No acute osseus abnormality. No discrete lytic or blastic osseous lesions. Sequelae of prior PLIF at the L4-5 level noted. Moderate to advanced multilevel degenerative changes elsewhere within the lumbar spine grossly similar.  IMPRESSION: 1. 3 mm obstructive stone at the right UVJ with secondary mild to moderate right hydroureteronephrosis. 2. Additional punctate 3 mm nonobstructive stone with bilateral renal atrophy and probable bilateral renal cysts, grossly stable. 3. Colonic diverticulosis without evidence for acute diverticulitis.   Electronically Signed   By: Jeannine Boga M.D.   On: 07/08/2018 21:11  CLINICAL DATA:  Hydronephrosis due to ureteral  calculus.  EXAM: RENAL / URINARY TRACT ULTRASOUND COMPLETE  COMPARISON:  CT 07/08/2018, 01/02/2018.  Ultrasound 01/18/2017  FINDINGS: Right Kidney:  Renal measurements: 10.3 x 5.1 x 4.8 cm = volume: 13.2 mL . Echogenicity within normal limits. No mass or hydronephrosis visualized.  Left Kidney:  Renal measurements: 10.0 x 4.8 x 4.8 cm = volume: 12.0 mL. Echogenicity within normal limits. 2.2 cm simple cyst, 1.3 cm complex cyst. These are stable from prior exams. Hydronephrosis visualized.  Bladder:  Appears normal for degree of bladder distention.  IMPRESSION: 1. No acute abnormality identified. No evidence of hydronephrosis. Previously identified right hydronephrosis of 12 3 2019 has resolved.  2.  Stable simple and complex cyst left kidney.   Electronically Signed   By: Marcello Moores  Register   On: 08/26/2018 16:08   I have independently reviewed the films and do not see further evidence of the right UVJ stone on ultrasound.    Assessment & Plan:    1. Right ureteral stone - She did not capture the stone  2. Right hydronephrosis - The hydronephrosis has resolved per RUS dated 08/26/2018  3. Microscopic hematuria - UA today demonstrates no hematuria   4. Lower abdominal pain - RUS negative for hydronephrosis  - urine sent to rule out indolent infection -notified daughter of results and advised her to watch for increasing pain, fevers/chills and nausea/vomiting and it they happen to seek treatment in the ED  Return for pending urine culture .  Zara Council, PA-C  [facility] [address], [place], [place] [address] [phone]  I, Adele Schilder, am acting as a Education administrator for [employer], PA-C.   I have reviewed the above documentation for accuracy and completeness, and I agree with the above.    Zara Council, PA-C

## 2018-08-26 ENCOUNTER — Encounter: Payer: Self-pay | Admitting: Urology

## 2018-08-26 ENCOUNTER — Ambulatory Visit: Payer: Medicare HMO | Admitting: Urology

## 2018-08-26 ENCOUNTER — Telehealth: Payer: Self-pay | Admitting: Urology

## 2018-08-26 ENCOUNTER — Ambulatory Visit
Admission: RE | Admit: 2018-08-26 | Discharge: 2018-08-26 | Disposition: A | Discharge status: Home / Self Care | Payer: Medicare HMO | Source: Ambulatory Visit | Attending: Urology | Admitting: Urology

## 2018-08-26 ENCOUNTER — Ambulatory Visit: Payer: Medicare HMO

## 2018-08-26 VITALS — BP 132/77 | HR 86 | Ht 62.0 in | Wt 160.0 lb

## 2018-08-26 DIAGNOSIS — N3090 Cystitis, unspecified without hematuria: Secondary | ICD-10-CM

## 2018-08-26 DIAGNOSIS — N201 Calculus of ureter: Secondary | ICD-10-CM | POA: Diagnosis not present

## 2018-08-26 DIAGNOSIS — N132 Hydronephrosis with renal and ureteral calculous obstruction: Secondary | ICD-10-CM

## 2018-08-26 DIAGNOSIS — R1030 Lower abdominal pain, unspecified: Secondary | ICD-10-CM | POA: Diagnosis not present

## 2018-08-26 LAB — URINALYSIS, COMPLETE
Bilirubin, UA: NEGATIVE
Glucose, UA: NEGATIVE
Ketones, UA: NEGATIVE
Nitrite, UA: NEGATIVE
Protein, UA: NEGATIVE
Specific Gravity, UA: >1.03 — ABNORMAL HIGH (ref 1.005–1.030)
Urobilinogen, Ur: 0.2 mg/dL (ref 0.2–1.0)
pH, UA: 5 (ref 5.0–7.5)

## 2018-08-26 LAB — MICROSCOPIC EXAMINATION: RBC, UA: NONE SEEN /hpf (ref 0–2)

## 2018-08-26 NOTE — Telephone Encounter (Signed)
Spoke to daughter regarding RUS results.  See previous telephone message.

## 2018-08-27 ENCOUNTER — Telehealth: Payer: Self-pay

## 2018-08-27 NOTE — Telephone Encounter (Signed)
Called and advised patients daughter and emergency contact, [name], per Zara Council, PA instruction and advised of [place] results. Patients daughter was advised to come in for DPR but stated her mother is moving to [place] soon as her dementia is worsening and cannot be on her own anymore but will have one signed if there is a change in plans.

## 2018-08-27 NOTE — Telephone Encounter (Signed)
-----   Message from Nori Riis, PA-C sent at 08/26/2018  4:40 PM EST ----- Please let [name], [name] daughter, know that her mother's kidney ultrasound was normal.  The stone has likely passed as the right kidney is no longer swollen.  If her abdominal pain worsens, she develops fevers or chills or vomiting to seek treatment in the ED.  I will send her urine for culture to make sure she doesn't have an indolent infection.

## 2018-08-29 LAB — CULTURE, URINE COMPREHENSIVE

## 2018-09-01 ENCOUNTER — Telehealth: Payer: Self-pay

## 2018-09-01 MED ORDER — CEPHALEXIN 500 MG PO CAPS: 500.0000 mg | ORAL_CAPSULE | Freq: Two times a day (BID) | ORAL | 0 refills | Status: AC

## 2018-09-01 NOTE — Telephone Encounter (Signed)
Called patient and left message for patients daughter, [name], to return call to office. Rx for Keflex sent to [facility] in [place].

## 2018-09-01 NOTE — Telephone Encounter (Signed)
-----   Message from Nori Riis, PA-C sent at 09/01/2018  7:52 AM EST ----- Please let [name] and her daughter, [name], know that her urine culture was positive for infection.  She should start Kefex 500 mg, BID x 7 days.

## 2018-09-01 NOTE — Telephone Encounter (Signed)
Patients daughter returned call. Advised of culture results and that Rx was sent to [facility]

## 2018-09-10 DIAGNOSIS — M5416 Radiculopathy, lumbar region: Secondary | ICD-10-CM | POA: Diagnosis not present

## 2018-10-19 ENCOUNTER — Other Ambulatory Visit: Payer: Self-pay | Admitting: Family Medicine

## 2018-10-19 NOTE — Telephone Encounter (Signed)
I understand that patient has moved out of state Please clarify If she has moved and has a new provider, we'll request that her new doctor refill and monitor her medicines She is always welcome back here if she returns Thank you

## 2018-10-20 NOTE — Telephone Encounter (Signed)
Pt has moved

## 2018-10-21 DIAGNOSIS — Z6830 Body mass index (BMI) 30.0-30.9, adult: Secondary | ICD-10-CM | POA: Diagnosis not present

## 2018-10-21 DIAGNOSIS — N3090 Cystitis, unspecified without hematuria: Secondary | ICD-10-CM | POA: Diagnosis not present

## 2018-10-21 DIAGNOSIS — H6120 Impacted cerumen, unspecified ear: Secondary | ICD-10-CM | POA: Diagnosis not present

## 2018-10-26 DIAGNOSIS — Z79899 Other long term (current) drug therapy: Secondary | ICD-10-CM | POA: Diagnosis not present

## 2018-10-26 DIAGNOSIS — E118 Type 2 diabetes mellitus with unspecified complications: Secondary | ICD-10-CM | POA: Diagnosis not present

## 2018-10-26 DIAGNOSIS — N390 Urinary tract infection, site not specified: Secondary | ICD-10-CM | POA: Diagnosis not present

## 2018-10-26 DIAGNOSIS — F329 Major depressive disorder, single episode, unspecified: Secondary | ICD-10-CM | POA: Diagnosis not present

## 2018-10-26 DIAGNOSIS — Z853 Personal history of malignant neoplasm of breast: Secondary | ICD-10-CM | POA: Diagnosis not present

## 2018-10-26 DIAGNOSIS — F0390 Unspecified dementia without behavioral disturbance: Secondary | ICD-10-CM | POA: Diagnosis not present

## 2018-10-27 ENCOUNTER — Ambulatory Visit: Payer: Medicare HMO | Admitting: Family Medicine

## 2018-12-31 ENCOUNTER — Telehealth: Payer: Self-pay | Admitting: Family Medicine

## 2018-12-31 NOTE — Telephone Encounter (Signed)
@  [facility]   Outreach Note  12/31/2018 Name: [name] MRN: [id number] DOB: 07-Jan-1930  Referred by: Arnetha Courser, MD Reason for referral : Chronic Care Management (Initial CCM outreach call was unsuccessful. )   An unsuccessful telephone outreach was attempted today. The patient was referred to the case management team by for assistance with chronic care management and care coordination.   Follow Up Plan: A HIPPA compliant phone message was left for the patient providing contact information and requesting a return call.  The CM team will reach out to the patient again over the next 7 days.  If patient returns call to provider office, please advise to call [place] at [facility]  [email or web address]   ??[phone]

## 2019-01-02 ENCOUNTER — Ambulatory Visit: Payer: Self-pay | Admitting: Family Medicine

## 2019-01-02 DIAGNOSIS — E119 Type 2 diabetes mellitus without complications: Secondary | ICD-10-CM | POA: Diagnosis not present

## 2019-01-02 DIAGNOSIS — R410 Disorientation, unspecified: Secondary | ICD-10-CM | POA: Diagnosis not present

## 2019-01-02 DIAGNOSIS — F0391 Unspecified dementia with behavioral disturbance: Secondary | ICD-10-CM | POA: Diagnosis not present

## 2019-01-02 DIAGNOSIS — I10 Essential (primary) hypertension: Secondary | ICD-10-CM | POA: Diagnosis not present

## 2019-01-02 DIAGNOSIS — H60502 Unspecified acute noninfective otitis externa, left ear: Secondary | ICD-10-CM | POA: Diagnosis not present

## 2019-01-02 DIAGNOSIS — R443 Hallucinations, unspecified: Secondary | ICD-10-CM | POA: Diagnosis not present

## 2019-01-02 DIAGNOSIS — N390 Urinary tract infection, site not specified: Secondary | ICD-10-CM | POA: Diagnosis not present

## 2019-01-02 DIAGNOSIS — D649 Anemia, unspecified: Secondary | ICD-10-CM | POA: Diagnosis not present

## 2019-01-02 DIAGNOSIS — F418 Other specified anxiety disorders: Secondary | ICD-10-CM | POA: Diagnosis not present

## 2019-01-02 NOTE — Chronic Care Management (AMB) (Signed)
Chronic Care Management   Note  01/02/2019 Name: ARAIYAH CUMPTON MRN: [id number] DOB: [birth year]  [name] is a 85 y.o. year old female who is a primary care patient of Lada, Satira Anis, MD. I reached out to [name] by phone today in response to a referral sent by Ms. Denese Killings [place]'s health plan.    Ms. Waskey was given information about Chronic Care Management services today including:  1. CCM service includes personalized support from designated clinical staff supervised by her physician, including individualized plan of care and coordination with other care providers 2. 24/7 contact phone numbers for assistance for urgent and routine care needs. 3. Service will only be billed when office clinical staff spend 20 minutes or more in a month to coordinate care. 4. Only one practitioner may furnish and bill the service in a calendar month. 5. The patient may stop CCM services at any time (effective at the end of the month) by phone call to the office staff. 6. The patient will be responsible for cost sharing (co-pay) of up to 20% of the service fee (after annual deductible is met).  Patient did not agree to enrollment in care management services and does not wish to consider at this time. Because she moved out of [place].   Follow up plan: The patient has been provided with contact information for the chronic care management team and has been advised to call with any health related questions or concerns.   [place]  [email or web address]   ??[phone]

## 2019-01-04 DIAGNOSIS — E119 Type 2 diabetes mellitus without complications: Secondary | ICD-10-CM | POA: Diagnosis not present

## 2019-01-04 DIAGNOSIS — R440 Auditory hallucinations: Secondary | ICD-10-CM | POA: Diagnosis not present

## 2019-01-04 DIAGNOSIS — R443 Hallucinations, unspecified: Secondary | ICD-10-CM | POA: Diagnosis not present

## 2019-01-04 DIAGNOSIS — D649 Anemia, unspecified: Secondary | ICD-10-CM | POA: Diagnosis not present

## 2019-01-04 DIAGNOSIS — E559 Vitamin D deficiency, unspecified: Secondary | ICD-10-CM | POA: Diagnosis not present

## 2019-01-04 DIAGNOSIS — F419 Anxiety disorder, unspecified: Secondary | ICD-10-CM | POA: Diagnosis not present

## 2019-01-04 DIAGNOSIS — F329 Major depressive disorder, single episode, unspecified: Secondary | ICD-10-CM | POA: Diagnosis not present

## 2019-01-04 DIAGNOSIS — M19011 Primary osteoarthritis, right shoulder: Secondary | ICD-10-CM | POA: Diagnosis not present

## 2019-01-04 DIAGNOSIS — Z043 Encounter for examination and observation following other accident: Secondary | ICD-10-CM | POA: Diagnosis not present

## 2019-01-04 DIAGNOSIS — I10 Essential (primary) hypertension: Secondary | ICD-10-CM | POA: Diagnosis not present

## 2019-01-04 DIAGNOSIS — F0390 Unspecified dementia without behavioral disturbance: Secondary | ICD-10-CM | POA: Diagnosis not present

## 2019-01-04 DIAGNOSIS — Z853 Personal history of malignant neoplasm of breast: Secondary | ICD-10-CM | POA: Diagnosis not present

## 2019-01-05 DIAGNOSIS — N3000 Acute cystitis without hematuria: Secondary | ICD-10-CM | POA: Diagnosis not present

## 2019-01-05 DIAGNOSIS — F0390 Unspecified dementia without behavioral disturbance: Secondary | ICD-10-CM | POA: Diagnosis not present

## 2019-01-05 DIAGNOSIS — R440 Auditory hallucinations: Secondary | ICD-10-CM | POA: Diagnosis not present

## 2019-01-05 DIAGNOSIS — R42 Dizziness and giddiness: Secondary | ICD-10-CM | POA: Diagnosis not present

## 2019-01-05 DIAGNOSIS — F418 Other specified anxiety disorders: Secondary | ICD-10-CM | POA: Diagnosis not present

## 2019-01-05 DIAGNOSIS — F329 Major depressive disorder, single episode, unspecified: Secondary | ICD-10-CM | POA: Diagnosis not present

## 2019-01-05 DIAGNOSIS — F0280 Dementia in other diseases classified elsewhere without behavioral disturbance: Secondary | ICD-10-CM | POA: Diagnosis not present

## 2019-01-05 DIAGNOSIS — Z853 Personal history of malignant neoplasm of breast: Secondary | ICD-10-CM | POA: Diagnosis not present

## 2019-01-05 DIAGNOSIS — D649 Anemia, unspecified: Secondary | ICD-10-CM | POA: Diagnosis not present

## 2019-01-05 DIAGNOSIS — F0391 Unspecified dementia with behavioral disturbance: Secondary | ICD-10-CM | POA: Diagnosis not present

## 2019-01-05 DIAGNOSIS — R443 Hallucinations, unspecified: Secondary | ICD-10-CM | POA: Diagnosis not present

## 2019-01-05 DIAGNOSIS — E119 Type 2 diabetes mellitus without complications: Secondary | ICD-10-CM | POA: Diagnosis not present

## 2019-01-05 DIAGNOSIS — F419 Anxiety disorder, unspecified: Secondary | ICD-10-CM | POA: Diagnosis not present

## 2019-01-05 DIAGNOSIS — I10 Essential (primary) hypertension: Secondary | ICD-10-CM | POA: Diagnosis not present

## 2019-01-05 DIAGNOSIS — E559 Vitamin D deficiency, unspecified: Secondary | ICD-10-CM | POA: Diagnosis not present

## 2019-01-05 DIAGNOSIS — G301 Alzheimer's disease with late onset: Secondary | ICD-10-CM | POA: Diagnosis not present

## 2019-01-05 DIAGNOSIS — F28 Other psychotic disorder not due to a substance or known physiological condition: Secondary | ICD-10-CM | POA: Diagnosis not present

## 2019-02-11 ENCOUNTER — Telehealth: Payer: Self-pay | Admitting: Family Medicine

## 2019-02-11 NOTE — Telephone Encounter (Signed)
Please schedule patient for follow up in the next 30 days.  

## 2019-02-12 NOTE — Telephone Encounter (Signed)
Pt moved out of state

## 2019-02-14 DIAGNOSIS — K5730 Diverticulosis of large intestine without perforation or abscess without bleeding: Secondary | ICD-10-CM | POA: Diagnosis not present

## 2019-02-14 DIAGNOSIS — R109 Unspecified abdominal pain: Secondary | ICD-10-CM | POA: Diagnosis not present

## 2019-02-14 DIAGNOSIS — F0390 Unspecified dementia without behavioral disturbance: Secondary | ICD-10-CM | POA: Diagnosis not present

## 2019-02-14 DIAGNOSIS — E119 Type 2 diabetes mellitus without complications: Secondary | ICD-10-CM | POA: Diagnosis not present

## 2019-02-14 DIAGNOSIS — N390 Urinary tract infection, site not specified: Secondary | ICD-10-CM | POA: Diagnosis not present

## 2019-02-14 DIAGNOSIS — Z853 Personal history of malignant neoplasm of breast: Secondary | ICD-10-CM | POA: Diagnosis not present

## 2019-02-17 DIAGNOSIS — Z6830 Body mass index (BMI) 30.0-30.9, adult: Secondary | ICD-10-CM | POA: Diagnosis not present

## 2019-02-17 DIAGNOSIS — E119 Type 2 diabetes mellitus without complications: Secondary | ICD-10-CM | POA: Diagnosis not present

## 2019-02-17 DIAGNOSIS — Z1329 Encounter for screening for other suspected endocrine disorder: Secondary | ICD-10-CM | POA: Diagnosis not present

## 2019-02-17 DIAGNOSIS — Z0000 Encounter for general adult medical examination without abnormal findings: Secondary | ICD-10-CM | POA: Diagnosis not present

## 2019-02-17 DIAGNOSIS — R3989 Other symptoms and signs involving the genitourinary system: Secondary | ICD-10-CM | POA: Diagnosis not present

## 2019-02-17 DIAGNOSIS — E785 Hyperlipidemia, unspecified: Secondary | ICD-10-CM | POA: Diagnosis not present

## 2019-02-17 DIAGNOSIS — M545 Low back pain: Secondary | ICD-10-CM | POA: Diagnosis not present

## 2019-02-17 DIAGNOSIS — R2241 Localized swelling, mass and lump, right lower limb: Secondary | ICD-10-CM | POA: Diagnosis not present

## 2019-02-17 DIAGNOSIS — H9212 Otorrhea, left ear: Secondary | ICD-10-CM | POA: Diagnosis not present

## 2019-02-18 DIAGNOSIS — F418 Other specified anxiety disorders: Secondary | ICD-10-CM | POA: Diagnosis not present

## 2019-02-18 DIAGNOSIS — F0390 Unspecified dementia without behavioral disturbance: Secondary | ICD-10-CM | POA: Diagnosis not present

## 2019-02-18 DIAGNOSIS — F060 Psychotic disorder with hallucinations due to known physiological condition: Secondary | ICD-10-CM | POA: Diagnosis not present

## 2019-03-08 ENCOUNTER — Other Ambulatory Visit: Payer: Self-pay | Admitting: Family Medicine

## 2019-03-20 DIAGNOSIS — F060 Psychotic disorder with hallucinations due to known physiological condition: Secondary | ICD-10-CM | POA: Diagnosis not present

## 2019-03-20 DIAGNOSIS — F418 Other specified anxiety disorders: Secondary | ICD-10-CM | POA: Diagnosis not present

## 2019-03-20 DIAGNOSIS — F0390 Unspecified dementia without behavioral disturbance: Secondary | ICD-10-CM | POA: Diagnosis not present

## 2019-03-24 DIAGNOSIS — K6289 Other specified diseases of anus and rectum: Secondary | ICD-10-CM | POA: Diagnosis not present

## 2019-03-24 DIAGNOSIS — R319 Hematuria, unspecified: Secondary | ICD-10-CM | POA: Diagnosis not present

## 2019-03-24 DIAGNOSIS — N390 Urinary tract infection, site not specified: Secondary | ICD-10-CM | POA: Diagnosis not present

## 2019-03-24 DIAGNOSIS — Z6830 Body mass index (BMI) 30.0-30.9, adult: Secondary | ICD-10-CM | POA: Diagnosis not present

## 2019-03-31 ENCOUNTER — Other Ambulatory Visit: Payer: Self-pay | Admitting: Family Medicine

## 2019-03-31 NOTE — Telephone Encounter (Signed)
Requested medication (s) are due for refill today: yes  Requested medication (s) are on the active medication list: yes  Last refill: 02/2019  Future visit scheduled: no  Notes to clinic:  Per protocol unable to refill   Requested Prescriptions  Pending Prescriptions Disp Refills   pioglitazone (ACTOS) 15 MG tablet [Pharmacy Med Name: PIOGLITAZONE HCL 15 MG TABLET] 30 tablet 0    Sig: TAKE 1 TABLET BY [place]     Endocrinology:  Diabetes - Glitazones - pioglitazone Failed - 03/31/2019 10:16 AM      Failed - HBA1C is between 0 and 7.9 and within 180 days    Hgb A1c MFr Bld  Date Value Ref Range Status  07/28/2018 7.1 (H) <5.7 % of total Hgb Final    Comment:    For someone without known diabetes, a hemoglobin A1c value of 6.5% or greater indicates that they may have  diabetes and this should be confirmed with a follow-up  test. . For someone with known diabetes, a value <7% indicates  that their diabetes is well controlled and a value  greater than or equal to 7% indicates suboptimal  control. A1c targets should be individualized based on  duration of diabetes, age, comorbid conditions, and  other considerations. . Currently, no consensus exists regarding use of hemoglobin A1c for diagnosis of diabetes for children. .          Failed - Valid encounter within last 6 months    Recent Outpatient Visits          8 months ago Controlled type 2 diabetes mellitus with diabetic neuropathy, without long-term current use of insulin [facility])   [place], MD   10 months ago Chronic bilateral lower abdominal pain   [place], Satira Anis, MD   10 months ago Generalized abdominal pain   [place], FNP   1 year ago Urine frequency   [place], MD   1 year ago Acute cystitis with hematuria   [place], [place], [place]

## 2019-04-21 ENCOUNTER — Ambulatory Visit: Payer: Medicare HMO

## 2019-04-26 DIAGNOSIS — Z9842 Cataract extraction status, left eye: Secondary | ICD-10-CM | POA: Diagnosis not present

## 2019-04-26 DIAGNOSIS — R200 Anesthesia of skin: Secondary | ICD-10-CM | POA: Diagnosis not present

## 2019-04-26 DIAGNOSIS — E119 Type 2 diabetes mellitus without complications: Secondary | ICD-10-CM | POA: Diagnosis not present

## 2019-04-26 DIAGNOSIS — Z7982 Long term (current) use of aspirin: Secondary | ICD-10-CM | POA: Diagnosis not present

## 2019-04-26 DIAGNOSIS — F0390 Unspecified dementia without behavioral disturbance: Secondary | ICD-10-CM | POA: Diagnosis not present

## 2019-04-26 DIAGNOSIS — R1031 Right lower quadrant pain: Secondary | ICD-10-CM | POA: Diagnosis not present

## 2019-04-26 DIAGNOSIS — B349 Viral infection, unspecified: Secondary | ICD-10-CM | POA: Diagnosis not present

## 2019-04-26 DIAGNOSIS — M25551 Pain in right hip: Secondary | ICD-10-CM | POA: Diagnosis not present

## 2019-04-26 DIAGNOSIS — Z9011 Acquired absence of right breast and nipple: Secondary | ICD-10-CM | POA: Diagnosis not present

## 2019-04-26 DIAGNOSIS — B369 Superficial mycosis, unspecified: Secondary | ICD-10-CM | POA: Diagnosis not present

## 2019-04-26 DIAGNOSIS — Z9841 Cataract extraction status, right eye: Secondary | ICD-10-CM | POA: Diagnosis not present

## 2019-04-26 DIAGNOSIS — Z9622 Myringotomy tube(s) status: Secondary | ICD-10-CM | POA: Diagnosis not present

## 2019-04-26 DIAGNOSIS — N281 Cyst of kidney, acquired: Secondary | ICD-10-CM | POA: Diagnosis not present

## 2019-07-05 ENCOUNTER — Other Ambulatory Visit: Payer: Self-pay | Admitting: Family Medicine

## 2019-07-31 ENCOUNTER — Other Ambulatory Visit: Payer: Self-pay | Admitting: Family Medicine

## 2019-08-03 NOTE — Telephone Encounter (Signed)
Requested medication (s) are due for refill today: yes  Requested medication (s) are on the active medication list: yes  Last refill:  07/06/2019  Future visit scheduled: no  Notes to clinic:  no valid encounter in last 12 months    Requested Prescriptions  Pending Prescriptions Disp Refills   atorvastatin (LIPITOR) 40 MG tablet [Pharmacy Med Name: ATORVASTATIN 40 MG TABLET] 30 tablet 0    Sig: TAKE 1 TABLET BY MOUTH EVERYDAY AT BEDTIME      Cardiovascular:  Antilipid - Statins Failed - 07/31/2019 10:30 AM      Failed - Total Cholesterol in normal range and within 360 days    Cholesterol, Total  Date Value Ref Range Status  12/28/2015 218 (H) 100 - 199 mg/dL Final   Cholesterol  Date Value Ref Range Status  07/28/2018 154 <200 mg/dL Final          Failed - LDL in normal range and within 360 days    LDL Cholesterol (Calc)  Date Value Ref Range Status  07/28/2018 63 mg/dL (calc) Final    Comment:    Reference range: <100 . Desirable range <100 mg/dL for primary prevention;   <70 mg/dL for patients with CHD or diabetic patients  with > or = 2 CHD risk factors. Mar[place] Kitchen LDL-C is now calculated using the Martin-Hopkins  calculation, which is a validated novel method providing  better accuracy than the Friedewald equation in the  estimation of LDL-C.  Cresenciano Genre et al. Annamaria Helling. [id number]): [id number]  (http://education.[facility].com/faq/FAQ164)           Failed - HDL in normal range and within 360 days    HDL  Date Value Ref Range Status  07/28/2018 75 >50 mg/dL Final  12/28/2015 80 >39 mg/dL Final          Failed - Triglycerides in normal range and within 360 days    Triglycerides  Date Value Ref Range Status  07/28/2018 82 <150 mg/dL Final          Failed - Valid encounter within last 12 months    Recent Outpatient Visits           1 year ago Controlled type 2 diabetes mellitus with diabetic neuropathy, without long-term current use of insulin [facility])   [place], Satira Anis, MD   1 year ago Chronic bilateral lower abdominal pain   [place], Satira Anis, MD   1 year ago Generalized abdominal pain   [place], FNP   1 year ago Urine frequency   [place], MD   1 year ago Acute cystitis with hematuria   [place], [place], [place]              Passed - Patient is not pregnant
